# Patient Record
Sex: Male | Born: 1960 | Race: White | Hispanic: No | Smoking: Current every day smoker
Health system: Southern US, Community
[De-identification: ages and names within clinical notes are randomized; demographics above are authoritative.]

## PROBLEM LIST (undated history)

## (undated) DIAGNOSIS — F1490 Cocaine use, unspecified, uncomplicated: Secondary | ICD-10-CM

## (undated) DIAGNOSIS — I2699 Other pulmonary embolism without acute cor pulmonale: Secondary | ICD-10-CM

## (undated) DIAGNOSIS — B182 Chronic viral hepatitis C: Secondary | ICD-10-CM

## (undated) DIAGNOSIS — I10 Essential (primary) hypertension: Secondary | ICD-10-CM

## (undated) DIAGNOSIS — F1910 Other psychoactive substance abuse, uncomplicated: Secondary | ICD-10-CM

## (undated) DIAGNOSIS — F1020 Alcohol dependence, uncomplicated: Secondary | ICD-10-CM

## (undated) DIAGNOSIS — K7030 Alcoholic cirrhosis of liver without ascites: Secondary | ICD-10-CM

---

## 2007-10-22 ENCOUNTER — Emergency Department: Payer: Self-pay | Admitting: Emergency Medicine

## 2008-10-07 IMAGING — CR DG KNEE COMPLETE 4+V*L*
1 series · 5 of 5 positions shown · non-contrast
Comparison: none

REASON FOR EXAM: Injury
COMMENTS:   LMP: (Male)

PROCEDURE:     DXR - DXR KNEE LT COMP WITH OBLIQUES  - October 22, 2007  [DATE]
RESULT:     There does not appear to be evidence of acute fracture,
dislocation or malalignment. Findings suspicious for chronic injury along
the posterior lateral aspect of the knee are appreciated.

[Series 1: view not recorded · 0.17mm/px · 5 of 5 slices shown]
[im 1/5]
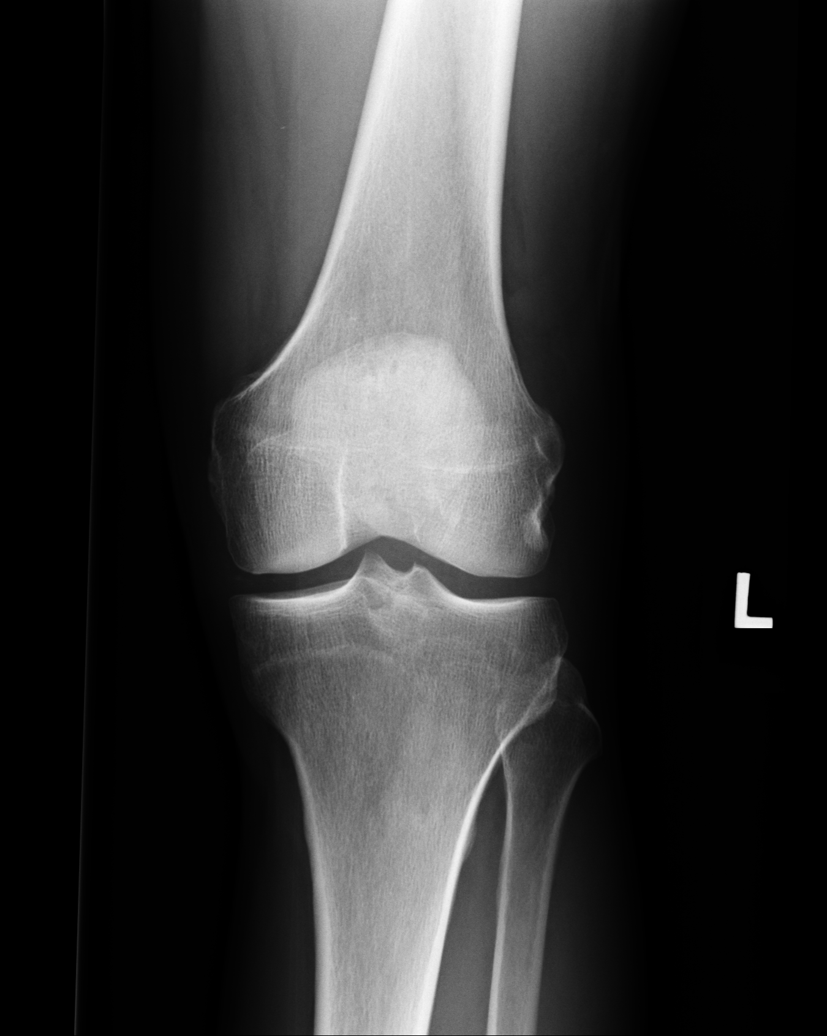
[im 2/5]
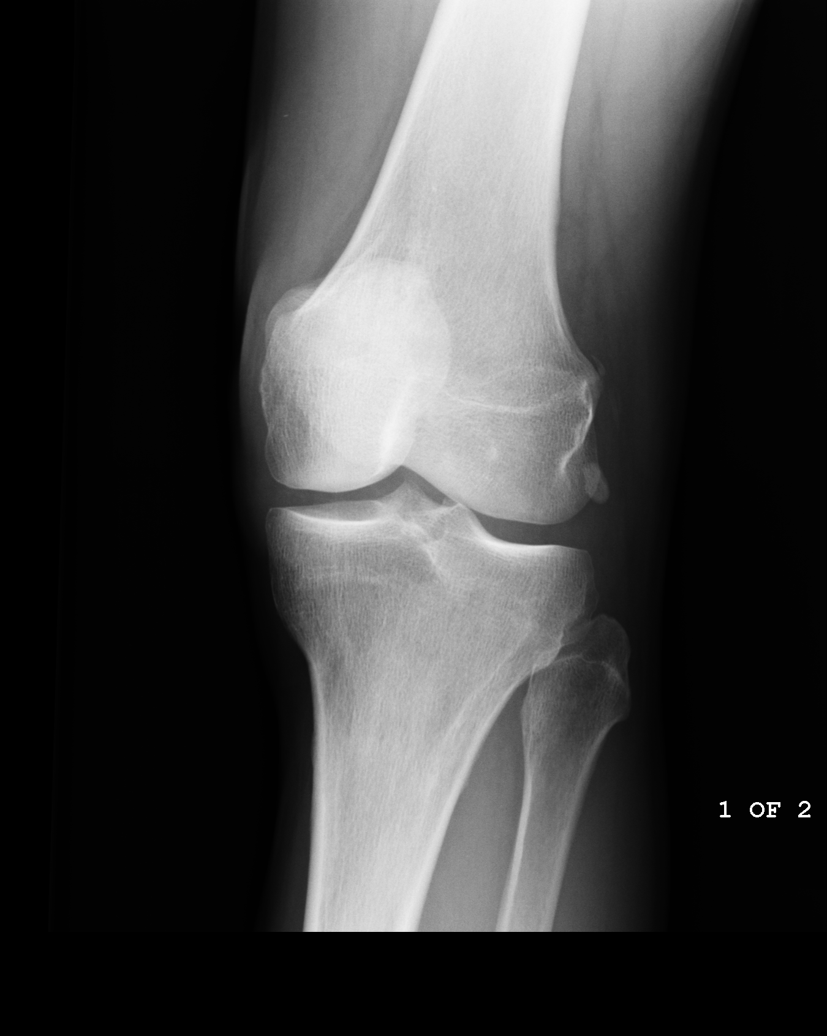
[im 3/5]
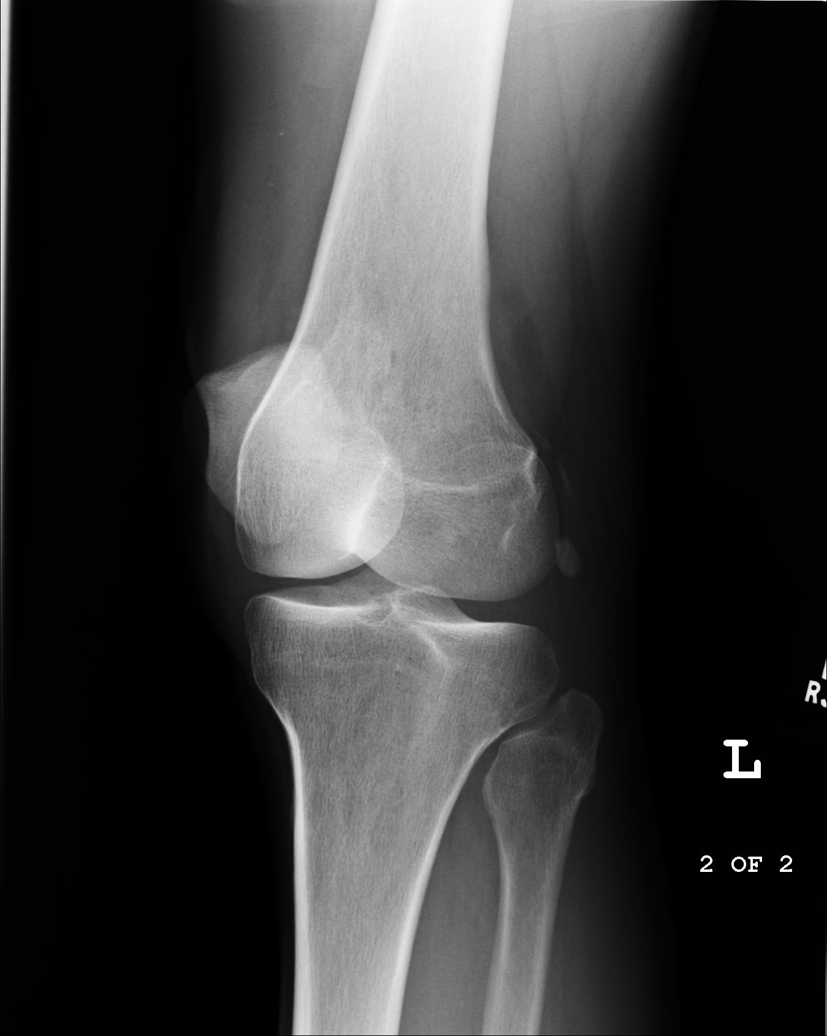
[im 4/5]
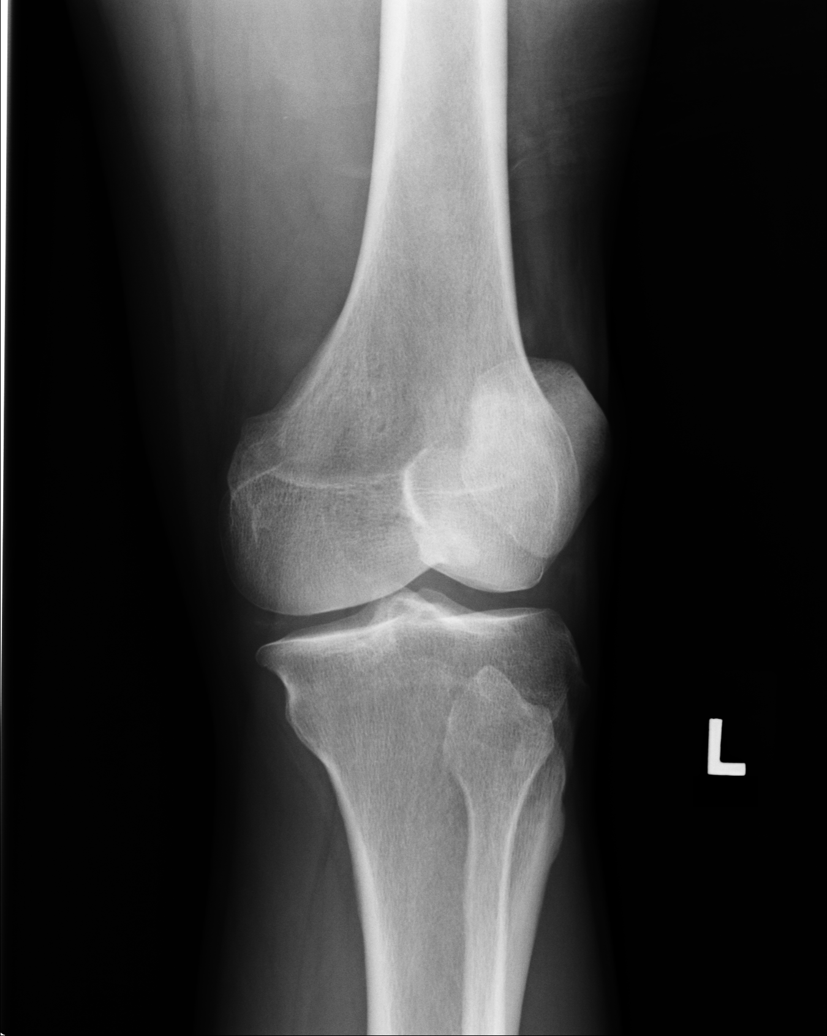
[im 5/5]
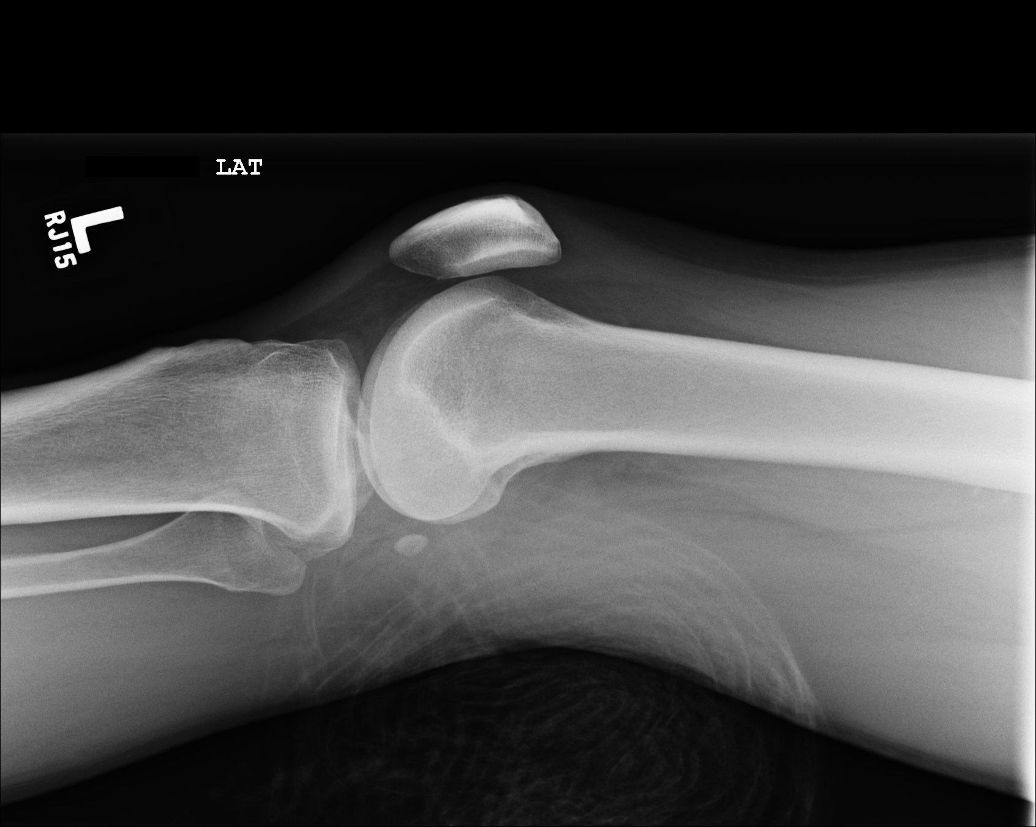

[5 of 5 positions shown; findings below may reference images not displayed]

IMPRESSION: 1.     No evidence of acute osseous abnormalities.
2.     If there is persistent clinical concern, further evaluation with MRI
is recommended.

## 2009-10-06 ENCOUNTER — Emergency Department: Payer: Self-pay | Admitting: Emergency Medicine

## 2011-09-22 ENCOUNTER — Inpatient Hospital Stay: Payer: Self-pay | Admitting: Psychiatry

## 2011-11-04 ENCOUNTER — Inpatient Hospital Stay: Payer: Self-pay | Admitting: Psychiatry

## 2011-11-04 LAB — CBC
HGB: 16.3 g/dL (ref 13.0–18.0)
MCHC: 33.7 g/dL (ref 32.0–36.0)
MCV: 94 fL (ref 80–100)
RBC: 5.11 10*6/uL (ref 4.40–5.90)
WBC: 6 10*3/uL (ref 3.8–10.6)

## 2011-11-04 LAB — URINALYSIS, COMPLETE
Bacteria: NONE SEEN
Bilirubin,UR: NEGATIVE
Glucose,UR: NEGATIVE mg/dL (ref 0–75)
Ketone: NEGATIVE
Nitrite: NEGATIVE
Protein: NEGATIVE
Specific Gravity: 1.009 (ref 1.003–1.030)
Squamous Epithelial: NONE SEEN
WBC UR: 1 /HPF (ref 0–5)

## 2011-11-04 LAB — DRUG SCREEN, URINE
Amphetamines, Ur Screen: NEGATIVE (ref ?–1000)
Benzodiazepine, Ur Scrn: NEGATIVE (ref ?–200)
Cocaine Metabolite,Ur ~~LOC~~: POSITIVE (ref ?–300)
MDMA (Ecstasy)Ur Screen: NEGATIVE (ref ?–500)
Opiate, Ur Screen: NEGATIVE (ref ?–300)
Phencyclidine (PCP) Ur S: NEGATIVE (ref ?–25)
Tricyclic, Ur Screen: NEGATIVE (ref ?–1000)

## 2011-11-04 LAB — COMPREHENSIVE METABOLIC PANEL
Albumin: 4.1 g/dL (ref 3.4–5.0)
Alkaline Phosphatase: 97 U/L (ref 50–136)
Calcium, Total: 9.6 mg/dL (ref 8.5–10.1)
Chloride: 101 mmol/L (ref 98–107)
Creatinine: 1.03 mg/dL (ref 0.60–1.30)
EGFR (Non-African Amer.): >60
Glucose: 172 mg/dL — ABNORMAL HIGH (ref 65–99)
SGOT(AST): 38 U/L — ABNORMAL HIGH (ref 15–37)
SGPT (ALT): 36 U/L
Total Protein: 8.5 g/dL — ABNORMAL HIGH (ref 6.4–8.2)

## 2011-11-04 LAB — TSH: Thyroid Stimulating Horm: 1.54 u[IU]/mL

## 2011-12-17 ENCOUNTER — Emergency Department: Payer: Self-pay | Admitting: Emergency Medicine

## 2011-12-17 LAB — URINALYSIS, COMPLETE
Bacteria: NONE SEEN
Bilirubin,UR: NEGATIVE
Protein: NEGATIVE
RBC,UR: NONE SEEN /HPF (ref 0–5)
Specific Gravity: 1.01 (ref 1.003–1.030)

## 2011-12-17 LAB — ACETAMINOPHEN LEVEL: Acetaminophen: <2 ug/mL

## 2011-12-17 LAB — ETHANOL
Ethanol %: 0.006 % (ref 0.000–0.080)
Ethanol: 6 mg/dL

## 2011-12-17 LAB — CBC
HCT: 47.1 % (ref 40.0–52.0)
HGB: 16.3 g/dL (ref 13.0–18.0)
MCH: 32.2 pg (ref 26.0–34.0)
MCHC: 34.6 g/dL (ref 32.0–36.0)
RBC: 5.07 10*6/uL (ref 4.40–5.90)
RDW: 13.4 % (ref 11.5–14.5)
WBC: 8.1 10*3/uL (ref 3.8–10.6)

## 2011-12-17 LAB — DRUG SCREEN, URINE
Amphetamines, Ur Screen: NEGATIVE (ref ?–1000)
Barbiturates, Ur Screen: NEGATIVE (ref ?–200)
Benzodiazepine, Ur Scrn: NEGATIVE (ref ?–200)
Cocaine Metabolite,Ur ~~LOC~~: POSITIVE (ref ?–300)
MDMA (Ecstasy)Ur Screen: NEGATIVE (ref ?–500)
Methadone, Ur Screen: NEGATIVE (ref ?–300)
Opiate, Ur Screen: NEGATIVE (ref ?–300)

## 2011-12-17 LAB — COMPREHENSIVE METABOLIC PANEL
Albumin: 4.6 g/dL (ref 3.4–5.0)
Anion Gap: 13 (ref 7–16)
BUN: 10 mg/dL (ref 7–18)
Calcium, Total: 9.4 mg/dL (ref 8.5–10.1)
Chloride: 100 mmol/L (ref 98–107)
Creatinine: 1.08 mg/dL (ref 0.60–1.30)
EGFR (African American): >60
Glucose: 103 mg/dL — ABNORMAL HIGH (ref 65–99)
SGOT(AST): 46 U/L — ABNORMAL HIGH (ref 15–37)
Total Protein: 8.9 g/dL — ABNORMAL HIGH (ref 6.4–8.2)

## 2011-12-17 LAB — LIPASE, BLOOD: Lipase: 160 U/L (ref 73–393)

## 2011-12-17 LAB — SALICYLATE LEVEL: Salicylates, Serum: 3.3 mg/dL — ABNORMAL HIGH

## 2011-12-24 ENCOUNTER — Emergency Department: Payer: Self-pay | Admitting: *Deleted

## 2011-12-24 LAB — ETHANOL: Ethanol %: 0.174 % — ABNORMAL HIGH (ref 0.000–0.080)

## 2011-12-24 LAB — DRUG SCREEN, URINE
Amphetamines, Ur Screen: NEGATIVE (ref ?–1000)
Barbiturates, Ur Screen: NEGATIVE (ref ?–200)
MDMA (Ecstasy)Ur Screen: NEGATIVE (ref ?–500)
Methadone, Ur Screen: NEGATIVE (ref ?–300)
Opiate, Ur Screen: NEGATIVE (ref ?–300)
Tricyclic, Ur Screen: NEGATIVE (ref ?–1000)

## 2012-10-20 IMAGING — CR RIGHT LITTLE FINGER 2+V
1 series · 3 of 3 positions shown · non-contrast
Comparison: none

REASON FOR EXAM: pain proximal phalanx
COMMENTS:   May transport without cardiac monitor

[Series 1: pa · 0.17mm/px · 3 of 3 slices shown]
[im 1/3]
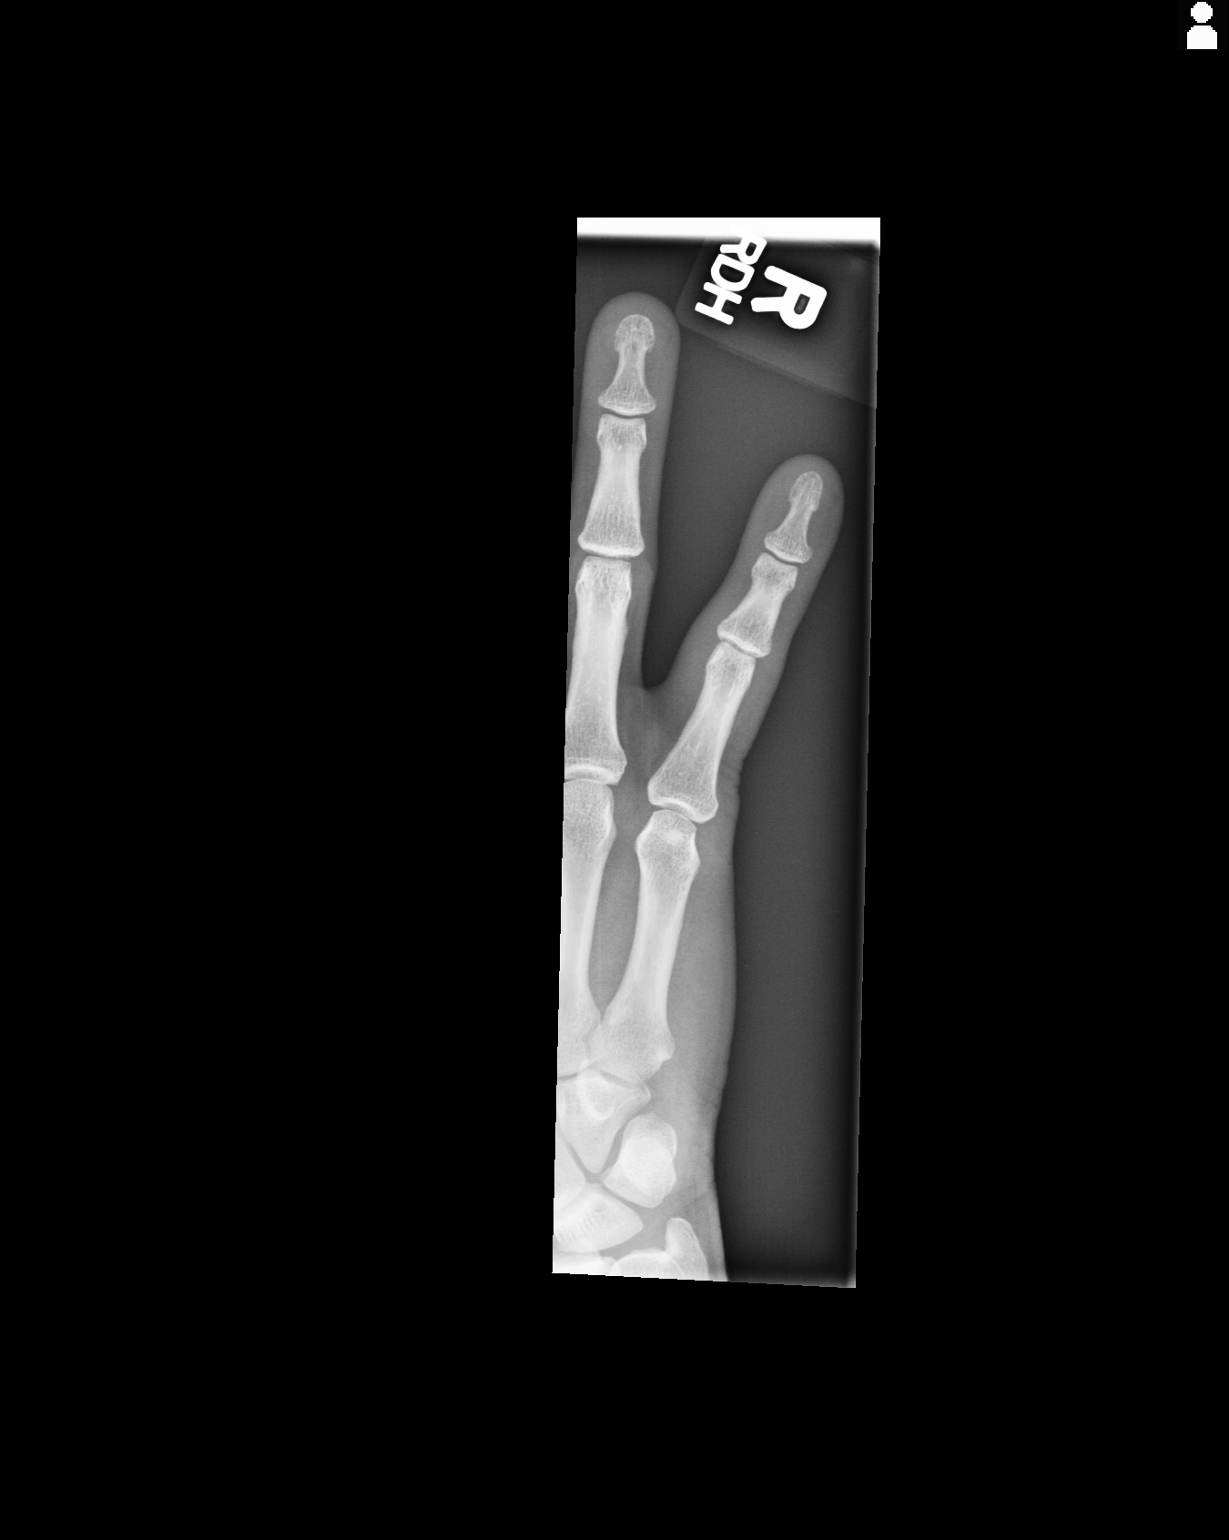
[im 2/3]
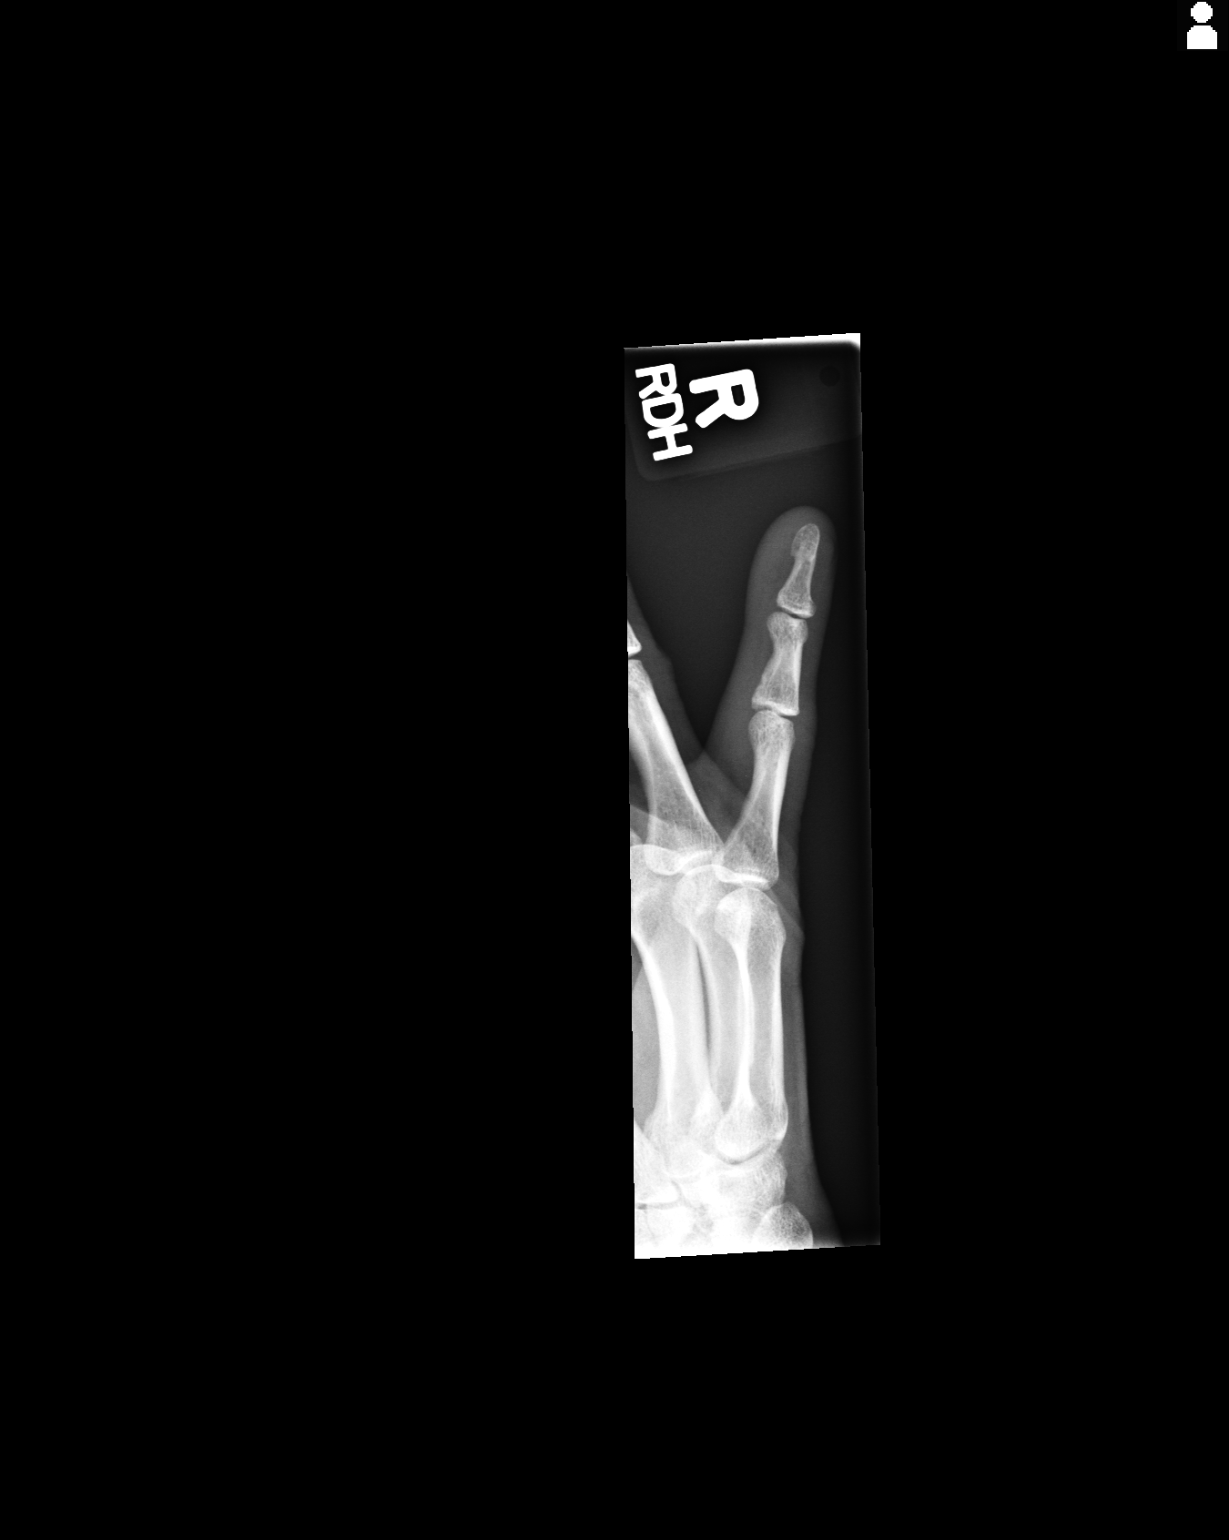
[im 3/3]
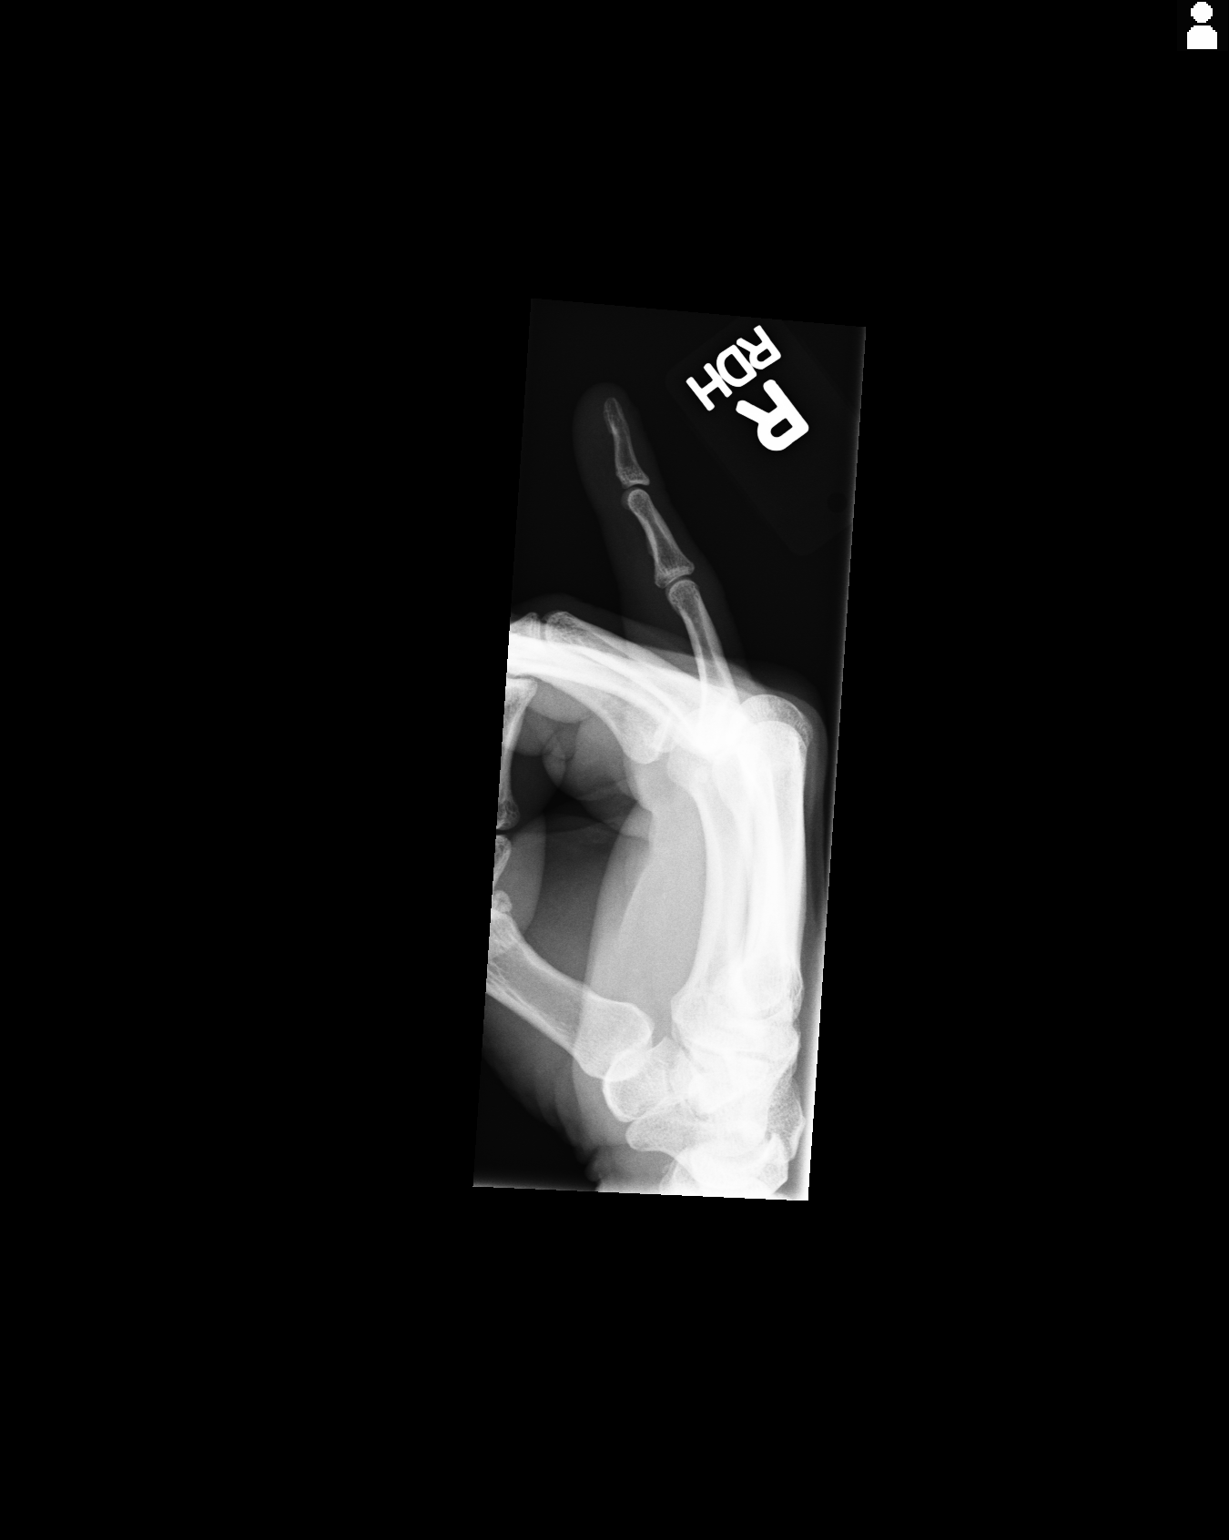

[3 of 3 positions shown; findings below may reference images not displayed]

PROCEDURE:     DXR - DXR FINGER PINKY 5TH DIGIT RT HA  - November 04, 2011 [DATE]

RESULT:     Three views of the right fifth finger reveal the bones to be
adequately mineralized. I do not see evidence of a fracture nor dislocation.
The interphalangeal joints are preserved. The metacarpophalangeal joint is
normal in appearance.
IMPRESSION: I see no acute bony abnormality of the right fifth finger.
There may be mild soft tissue swelling approximately.

## 2012-12-14 ENCOUNTER — Emergency Department: Payer: Self-pay | Admitting: Emergency Medicine

## 2012-12-14 LAB — URINALYSIS, COMPLETE
Bacteria: NONE SEEN
Bilirubin,UR: NEGATIVE
Blood: NEGATIVE
Leukocyte Esterase: NEGATIVE
Ph: 5 (ref 4.5–8.0)
Protein: NEGATIVE
Specific Gravity: 1.011 (ref 1.003–1.030)
Squamous Epithelial: NONE SEEN
WBC UR: <1 /HPF (ref 0–5)

## 2012-12-14 LAB — CBC
HCT: 45.7 % (ref 40.0–52.0)
HGB: 15.4 g/dL (ref 13.0–18.0)
MCH: 30.4 pg (ref 26.0–34.0)
MCHC: 33.6 g/dL (ref 32.0–36.0)
MCV: 90 fL (ref 80–100)
RBC: 5.06 10*6/uL (ref 4.40–5.90)
RDW: 15.1 % — ABNORMAL HIGH (ref 11.5–14.5)
WBC: 10.5 10*3/uL (ref 3.8–10.6)

## 2012-12-14 LAB — COMPREHENSIVE METABOLIC PANEL
Albumin: 4 g/dL (ref 3.4–5.0)
Alkaline Phosphatase: 129 U/L (ref 50–136)
Anion Gap: 12 (ref 7–16)
BUN: 12 mg/dL (ref 7–18)
Bilirubin,Total: 0.7 mg/dL (ref 0.2–1.0)
Calcium, Total: 8.8 mg/dL (ref 8.5–10.1)
Chloride: 102 mmol/L (ref 98–107)
Co2: 22 mmol/L (ref 21–32)
Creatinine: 0.8 mg/dL (ref 0.60–1.30)
EGFR (African American): >60
Glucose: 161 mg/dL — ABNORMAL HIGH (ref 65–99)
Osmolality: 275 (ref 275–301)
Potassium: 3.7 mmol/L (ref 3.5–5.1)
SGOT(AST): 74 U/L — ABNORMAL HIGH (ref 15–37)
SGPT (ALT): 60 U/L (ref 12–78)
Sodium: 136 mmol/L (ref 136–145)
Total Protein: 8 g/dL (ref 6.4–8.2)

## 2012-12-14 LAB — LIPASE, BLOOD: Lipase: 123 U/L (ref 73–393)

## 2012-12-14 LAB — TROPONIN I
Troponin-I: <0.02 ng/mL
Troponin-I: <0.02 ng/mL

## 2013-11-30 IMAGING — CR DG CHEST 1V PORT
1 series · 2 of 2 positions shown · non-contrast
Comparison: none

REASON FOR EXAM: Chest pain
COMMENTS:

PROCEDURE:     DXR - DXR PORTABLE CHEST SINGLE VIEW  - December 14, 2012  [DATE]
RESULT:     Lungs are clear. Heart size is normal.

[Series 1: ap · 0.17mm/px · 2 of 2 slices shown]
[im 1/2]
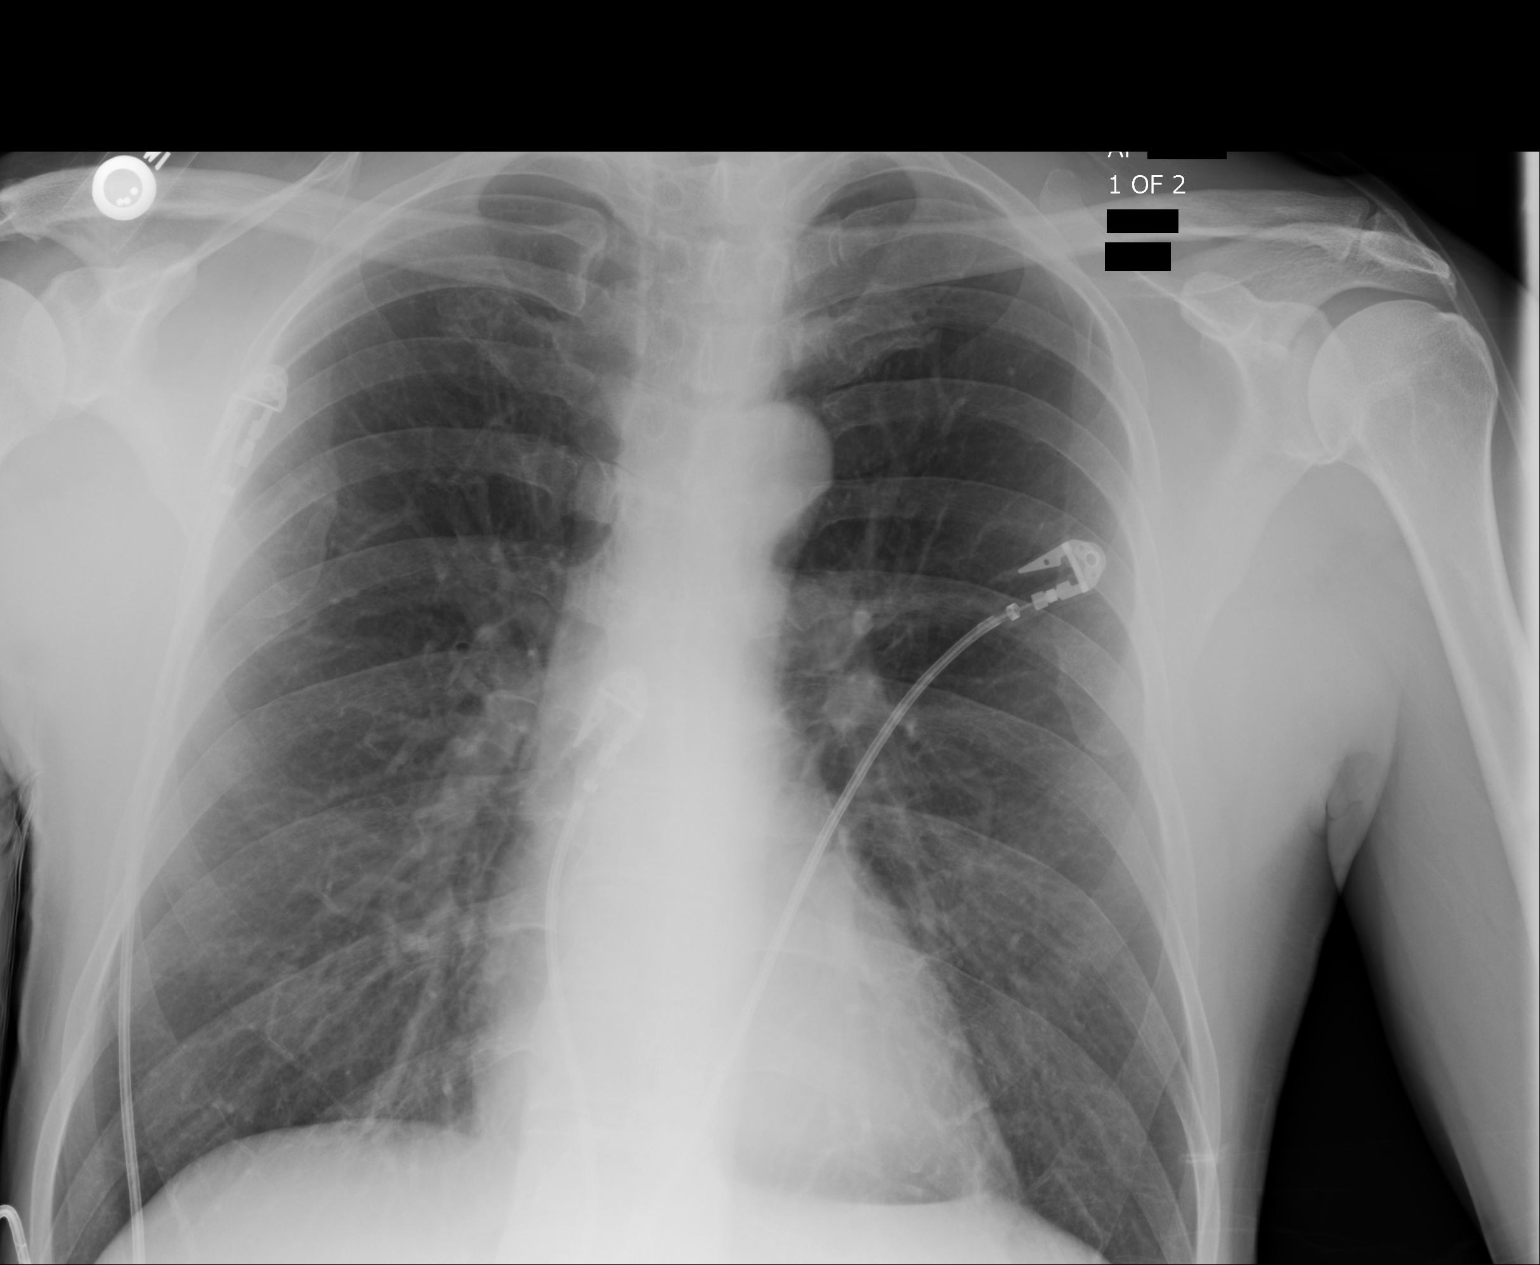
[im 2/2]
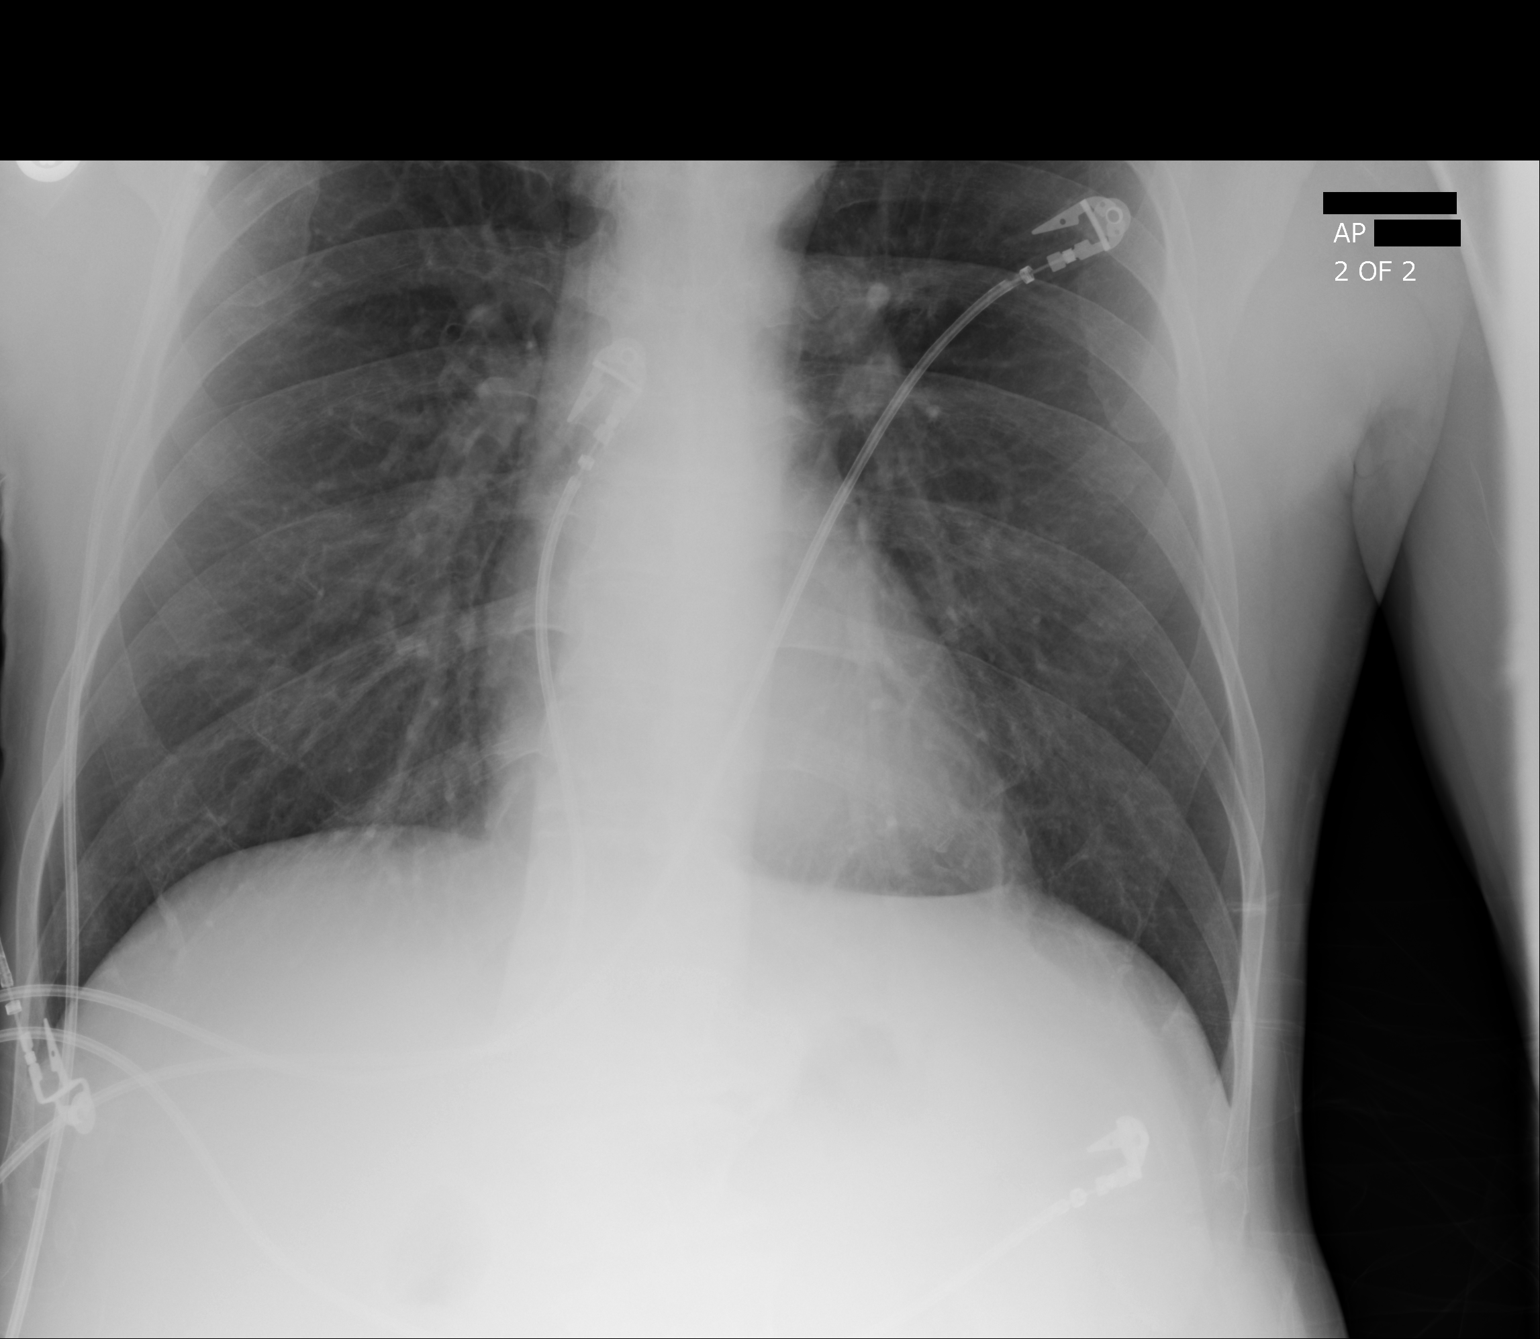

[2 of 2 positions shown; findings below may reference images not displayed]

IMPRESSION: No acute abnormality.
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
## 2014-03-07 ENCOUNTER — Emergency Department: Payer: Self-pay | Admitting: Emergency Medicine

## 2014-03-08 ENCOUNTER — Emergency Department: Payer: Self-pay | Admitting: Emergency Medicine

## 2014-03-08 LAB — COMPREHENSIVE METABOLIC PANEL
ALBUMIN: 3.6 g/dL (ref 3.4–5.0)
Alkaline Phosphatase: 93 U/L
Anion Gap: 12 (ref 7–16)
BUN: 6 mg/dL — ABNORMAL LOW (ref 7–18)
Bilirubin,Total: 1.3 mg/dL — ABNORMAL HIGH (ref 0.2–1.0)
CHLORIDE: 96 mmol/L — AB (ref 98–107)
CREATININE: 0.77 mg/dL (ref 0.60–1.30)
Calcium, Total: 9.1 mg/dL (ref 8.5–10.1)
Co2: 24 mmol/L (ref 21–32)
EGFR (Non-African Amer.): >60
Glucose: 107 mg/dL — ABNORMAL HIGH (ref 65–99)
OSMOLALITY: 263 (ref 275–301)
POTASSIUM: 3.2 mmol/L — AB (ref 3.5–5.1)
SGOT(AST): 71 U/L — ABNORMAL HIGH (ref 15–37)
SGPT (ALT): 68 U/L (ref 12–78)
SODIUM: 132 mmol/L — AB (ref 136–145)
Total Protein: 7.6 g/dL (ref 6.4–8.2)

## 2014-03-08 LAB — CBC
HCT: 42.5 % (ref 40.0–52.0)
HGB: 14.6 g/dL (ref 13.0–18.0)
MCH: 32.9 pg (ref 26.0–34.0)
MCHC: 34.3 g/dL (ref 32.0–36.0)
MCV: 96 fL (ref 80–100)
PLATELETS: 80 10*3/uL — AB (ref 150–440)
RBC: 4.43 10*6/uL (ref 4.40–5.90)
RDW: 14.8 % — AB (ref 11.5–14.5)
WBC: 7.8 10*3/uL (ref 3.8–10.6)

## 2014-03-08 LAB — TROPONIN I: Troponin-I: <0.02 ng/mL

## 2014-03-08 LAB — CK TOTAL AND CKMB (NOT AT ARMC): CK, TOTAL: 159 U/L

## 2014-09-30 ENCOUNTER — Emergency Department: Payer: Self-pay | Admitting: Emergency Medicine

## 2014-09-30 LAB — COMPREHENSIVE METABOLIC PANEL
ALBUMIN: 4.3 g/dL (ref 3.4–5.0)
ALK PHOS: 114 U/L
Anion Gap: 14 (ref 7–16)
BILIRUBIN TOTAL: 0.8 mg/dL (ref 0.2–1.0)
BUN: 10 mg/dL (ref 7–18)
CALCIUM: 8.4 mg/dL — AB (ref 8.5–10.1)
CO2: 20 mmol/L — AB (ref 21–32)
CREATININE: 0.95 mg/dL (ref 0.60–1.30)
Chloride: 96 mmol/L — ABNORMAL LOW (ref 98–107)
EGFR (African American): >60
EGFR (Non-African Amer.): >60
GLUCOSE: 212 mg/dL — AB (ref 65–99)
OSMOLALITY: 266 (ref 275–301)
Potassium: 3.9 mmol/L (ref 3.5–5.1)
SGOT(AST): 251 U/L — ABNORMAL HIGH (ref 15–37)
SGPT (ALT): 147 U/L — ABNORMAL HIGH
SODIUM: 130 mmol/L — AB (ref 136–145)
Total Protein: 8 g/dL (ref 6.4–8.2)

## 2014-09-30 LAB — LIPASE, BLOOD: Lipase: 184 U/L (ref 73–393)

## 2014-09-30 LAB — CBC
HCT: 46 % (ref 40.0–52.0)
HGB: 15 g/dL (ref 13.0–18.0)
MCH: 32.6 pg (ref 26.0–34.0)
MCHC: 32.7 g/dL (ref 32.0–36.0)
MCV: 100 fL (ref 80–100)
Platelet: 113 10*3/uL — ABNORMAL LOW (ref 150–440)
RBC: 4.62 10*6/uL (ref 4.40–5.90)
RDW: 13.3 % (ref 11.5–14.5)
WBC: 6.4 10*3/uL (ref 3.8–10.6)

## 2014-09-30 LAB — TROPONIN I: Troponin-I: <0.02 ng/mL

## 2014-09-30 LAB — ETHANOL: Ethanol: 18 mg/dL
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
## 2015-01-16 NOTE — H&P (Signed)
PATIENT NAME:  Jose [name], Jose [name] MR#:  [id number] DATE OF BIRTH:  1961-06-08  DATE OF ADMISSION:  09/22/2011  IDENTIFYING INFORMATION: The patient is a 54 year old white male not employed and last worked in 1996. The patient is divorced for eight years and has been living by himself in a trailer. The patient comes for admission to [facility] with chief complaint "I want to get rid of my alcohol habit. I want to get over my drugs".  HISTORY OF PRESENT ILLNESS: The patient reports that he has been having problems with alcohol and drugs for several years. In fact, he started having problems with alcohol drinking 20 years ago and for the past several years he has been drinking at the rate of a 12 pack per day and the last drink was just prior to his admission to the hospital. In addition, he smokes THC occasionally and smokes cocaine about 100 dollars worth whenever he has money. Last smoked it two days ago. He decided that he needed help.   PAST PSYCHIATRIC HISTORY: History of inpatient hospitalization on Psychiatry [facility] years ago at [facility] for a similar problem for detox and he was sent to [facility]. No history of suicide attempts. He is not being followed by any psychiatrist at this time.   FAMILY HISTORY OF MENTAL ILLNESS: None known for mental illness. No known history of suicides in the family.   FAMILY HISTORY: Raised by parents. Father worked in a mill. Father died of cancer of the liver. Mother worked in a mill. Mother is living and 53 years old. He has three brothers and one sister. Close to family.   PERSONAL HISTORY: Born in [place]. Dropped out in 10th grade to go to work. No GED.  WORK HISTORY: First job was at [employer] at age 54 years. This job lasted for 7 years. Quit when he had a DWI and had transportation problems. Longest job that he has held was for an [employer], [employer] and this job lasted for several years and quit after he had a DWI and had transportation problems. Last  worked at few months ago.[employer].   MILITARY HISTORY: None.  MARRIAGES: Married once. Divorced and cause of divorce "we could not get along". No children.  ALCOHOL AND DRUGS: First drink of alcohol was age 54 years. Became a problem several years ago and has become worse for the past five years and he's drinking at the rate of 12 pack per day and last drink was just prior to admission to the hospital. Had three DWI's. Lost his driver's license. He gets around by getting rides from friends and others. Does admit smoking THC occasionally. Does admit smoking cocaine whenever he has money and smokes about 100 dollars worth and last smoked it two days ago.   MEDICAL HISTORY: No known high blood pressure. No known diabetes mellitus. No major surgeries. No major injuries. No history of motor vehicle accident. Never been unconscious.   ALLERGIES: No known drug allergies.  PRIMARY CARE PHYSICIAN: He is not being followed by any physician. Does not have a physician that takes care of his physical examinations.  PHYSICAL EXAMINATION:  VITAL SIGNS: Temperature 96.2, pulse 98 per minute and regular, respirations 18 per minute and regular, blood pressure 140/84 mmHg.  HEENT: Head is normocephalic and atraumatic. Pupils equal, round, and reactive to light and accommodation. Fundi bilaterally benign. Tympanic membranes visualized and clear.   NECK: Supple without any organomegaly, lymphadenopathy, or thyromegaly.  CHEST: Normal expansion. Normal breath  sounds heard.  HEART: Normal S1, S2 without any murmurs or gallops.  ABDOMEN: Soft. No organomegaly. Bowel sounds heard.   RECTAL: Deferred.  SKIN: Normal turgor. No rash. Warm and dry. Intact.  EXTREMITIES: Nontender. Normal range of motion. No signs of injury. No pedal edema.  NEUROLOGIC: Gait is normal. Romberg is negative. Cranial nerves II through XII grossly intact. Deep tendon reflexes 2+. Plantars normal response.  MENTAL STATUS EXAMINATION: The  patient is dressed in street clothes. Alert and oriented. He knew it was December 2012 and he was at [facility]. He knew the capital of [place]. He knew the capital of the [place]. He could name the current president. He does admit feeling low and down and depressed about his situation and life in general and wanting to get over his alcohol drinking problem along with cocaine problems. Does admit feeling hopeless and helpless. Admits feeling worthless and useless. Denies any ideas to hurt himself or others. No evidence of psychosis. Denies auditory or visual hallucinations. Denies any delusional or paranoid thinking. He could spell the word world forward but could not spell it backward. He could count money and knew four quarters, ten dimes, 20 nickels, and 100 pennies are in a dollar with a little prompting and help. Judgment is intact. For a fire, he said he would leave. For an envelope, he said he would mail it. Does admit to sleep and appetite disturbance. Says it gets worse when he's drinking alcohol. Insight and judgment guarded.  IMPRESSION: AXIS I: 1. Alcohol dependence, chronic, continuous. 2. Cocaine abuse. 3. THC abuse. 4. Nicotine dependence. 5. Substance induced mood disorder.  AXIS II: Deferred.   AXIS III: None major.  AXIS IV: Severe. Long history of alcohol drinking and has had three DWI's, occupational and financial secondary to the same.  AXIS V: GAF 25.  PLAN: The patient is admitted to [facility] for close observation, evaluation, and help. He will be started on CIWA protocol along with antidepressant and medication to help him rest. During the stay in the hospital, he will be given milieu therapy and supportive counseling. He will take part in individual and group therapy when he is ready to do so. Substance abuse counseling will be addressed during the stay in the hospital. At the time of discharge, he will be completely detoxed. He  will not have any withdrawal problems, his mood will be stable, and he will be given follow-up appointments in the community. He will be referred to substance abuse program if he wishes to go for them.   ____________________________ Jannet Mantis. Guss Bunde, MD skc:drc D: 09/23/2011 13:21:53 ET T: 09/23/2011 13:50:33 ET JOB#: [id number]  cc: Monika Salk K. Guss Bunde, MD, <Dictator> Beau Fanny MD ELECTRONICALLY SIGNED 09/29/2011 17:12

## 2015-01-16 NOTE — H&P (Signed)
PATIENT NAME:  Jose [name], Jose [name] MR#:  [id number] DATE OF BIRTH:  01/11/1961  DATE OF ADMISSION:  11/04/2011 DATE OF ASSESSMENT: 11/05/2011  REFERRING PHYSICIAN: Daryel November, Jose [name]   ATTENDING PHYSICIAN: Jose Ventress B. Jennet Maduro, Jose [name]   IDENTIFYING DATA: Jose [name] is a 54 year old male with a history of substance use.   CHIEF COMPLAINT: "I need detox."   HISTORY OF PRESENT ILLNESS: Jose [name] was discharged from [facility] at the beginning of January. He maintained sobriety for only four days. He started drinking alcohol at the rate of 8 to 10 beers a day, abusing Percocet, Xanax, Klonopin and cocaine. 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 He believes that if he straightens up the owners will hire him again. He was discharged on a combination of Celexa and trazodone in January but did not continue medication at home. He does not endorse any symptoms of depression, anxiety, no psychosis, no symptoms suggestive of bipolar mania. He believes that he comes to the hospital for a short hospitalization and will be fine.   PAST PSYCHIATRIC HISTORY: He had one detox 20 years ago, another at the beginning of January. There is no history of suicide attempt or hospitalization for other problems than substance abuse.   FAMILY PSYCHIATRIC HISTORY: No known mental illness or suicides in the family.   SOCIAL HISTORY: He was born in [place]. He quit school in the tenth grade. No GED. He started working right away. He quit the job after he got a DUI and had transportation problems. 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 He is homeless at the moment but believes that his mother will let him move in if he get a job and completes detox.   PAST MEDICAL HISTORY: None reported.   MEDICATIONS ON ADMISSION: None.   ALLERGIES: No known drug  allergies.   REVIEW OF SYSTEMS: CONSTITUTIONAL: No fevers or chills. EYES: No double or blurred vision. ENT: No hearing loss. RESPIRATORY:  No shortness of breath or cough. CARDIOVASCULAR: No chest pain or orthopnea. GASTROINTESTINAL: No abdominal pain, nausea, diarrhea, or vomiting. GU: No incontinence or frequency. ENDOCRINE: No heat or cold intolerance. LYMPHATIC: No anemia or easy bruising. INTEGUMENTARY: No acne or rash. MUSCULOSKELETAL: No muscle or joint pain. NEUROLOGIC: No tingling or weakness. PSYCHIATRIC: See history of present illness for details.   PHYSICAL EXAMINATION:  VITAL SIGNS: Blood pressure 137/94, pulse 108, respirations 20, temperature 96.2.   GENERAL: This is a well-developed male in no acute distress.   HEENT: The pupils are equal, round, and reactive to light. Sclerae are anicteric.   NECK: Supple. No thyromegaly.   LUNGS: Clear to auscultation. No dullness to percussion.   HEART: Regular rhythm and rate. No murmurs, rubs, or gallops.   ABDOMEN: Soft, nontender, nondistended. Positive bowel sounds.   MUSCULOSKELETAL: Normal muscle strength in all extremities.   SKIN: No rashes or bruises   LYMPHATIC: No cervical adenopathy.   NEUROLOGIC: Cranial nerves II through XII are intact.   LABORATORY, DIAGNOSTIC AND RADIOLOGICAL DATA:  Chemistries are within normal limits except for blood glucose of 172.  Blood alcohol level is zero.  LFTs are within normal limits except for AST of 38. TSH 1.54.  Urine toxicology screen positive for cocaine. CBC within normal limits.  Urinalysis is not suggestive of urinary  tract infection.   MENTAL STATUS EXAMINATION ON ADMISSION: The patient is alert and oriented to person, place, time, and situation. He is marginally groomed. There is some psychomotor retardation. He is in bed looking tired. He maintains good eye contact. His speech is soft. Mood is depressed with flat affect. Thought processing is logical and goal oriented.  Thought content: He denies suicidal or homicidal ideation. There are no delusions or paranoia. There are no auditory or visual hallucinations. His cognition is grossly intact. His insight and judgment are questionable.   SUICIDE RISK ASSESSMENT ON ADMISSION: This is a patient with a lifelong history of alcoholism and substance abuse who is unable to stop, who most likely came to the hospital to please his mother and his boss. He does not seem to be committed to sobriety and expects only a brief hospital stay.   ASSESSMENT:  AXIS I:  1. Alcohol dependence.  2. Polysubstance dependence.   AXIS II: Deferred.   AXIS III: None.   AXIS IV: Mental illness, substance abuse, employment, financial, housing, primary support, access to care, poor insight.   AXIS V: Global Assessment of Functioning score is 25.   PLAN: The patient was admitted to [facility] unit for safety, stabilization, and medication management. He was initially placed on suicide precautions and was closely monitored for any unsafe behaviors. He underwent full psychiatric and risk assessment. He received pharmacotherapy, individual and group psychotherapy, substance abuse counseling, and support from therapeutic milieu.   1. Detox: The patient was placed on a standard CIWA protocol. He will be monitored for any signs of alcohol withdrawal.  2. Substance abuse treatment: The patient declines.  3. Disposition: Most likely to his mother's place if she agrees to take him.   ____________________________ Jose GoodieJolanta B. Jennet MaduroPucilowska, Jose [name] jbp:cbb D: 11/05/2011 17:56:18 ET T: 11/05/2011 19:23:34 ET JOB#: [id number]  cc: Le Faulcon B. Jennet MaduroPucilowska, Jose [name], <Dictator> Shari ProwsJOLANTA B Kellee Sittner Jose [name] ELECTRONICALLY SIGNED 11/09/2011 15:27

## 2015-02-21 IMAGING — CR RIGHT ANKLE - COMPLETE 3+ VIEW
1 series · 3 of 3 positions shown · non-contrast
Comparison: None.

CLINICAL DATA: Lateral malleolus pain post twisting injury 2 days
ago

EXAM:
RIGHT ANKLE - COMPLETE 3+ VIEW

[Series 1: ap · 0.17mm/px · 3 of 3 slices shown]
[im 1/3]
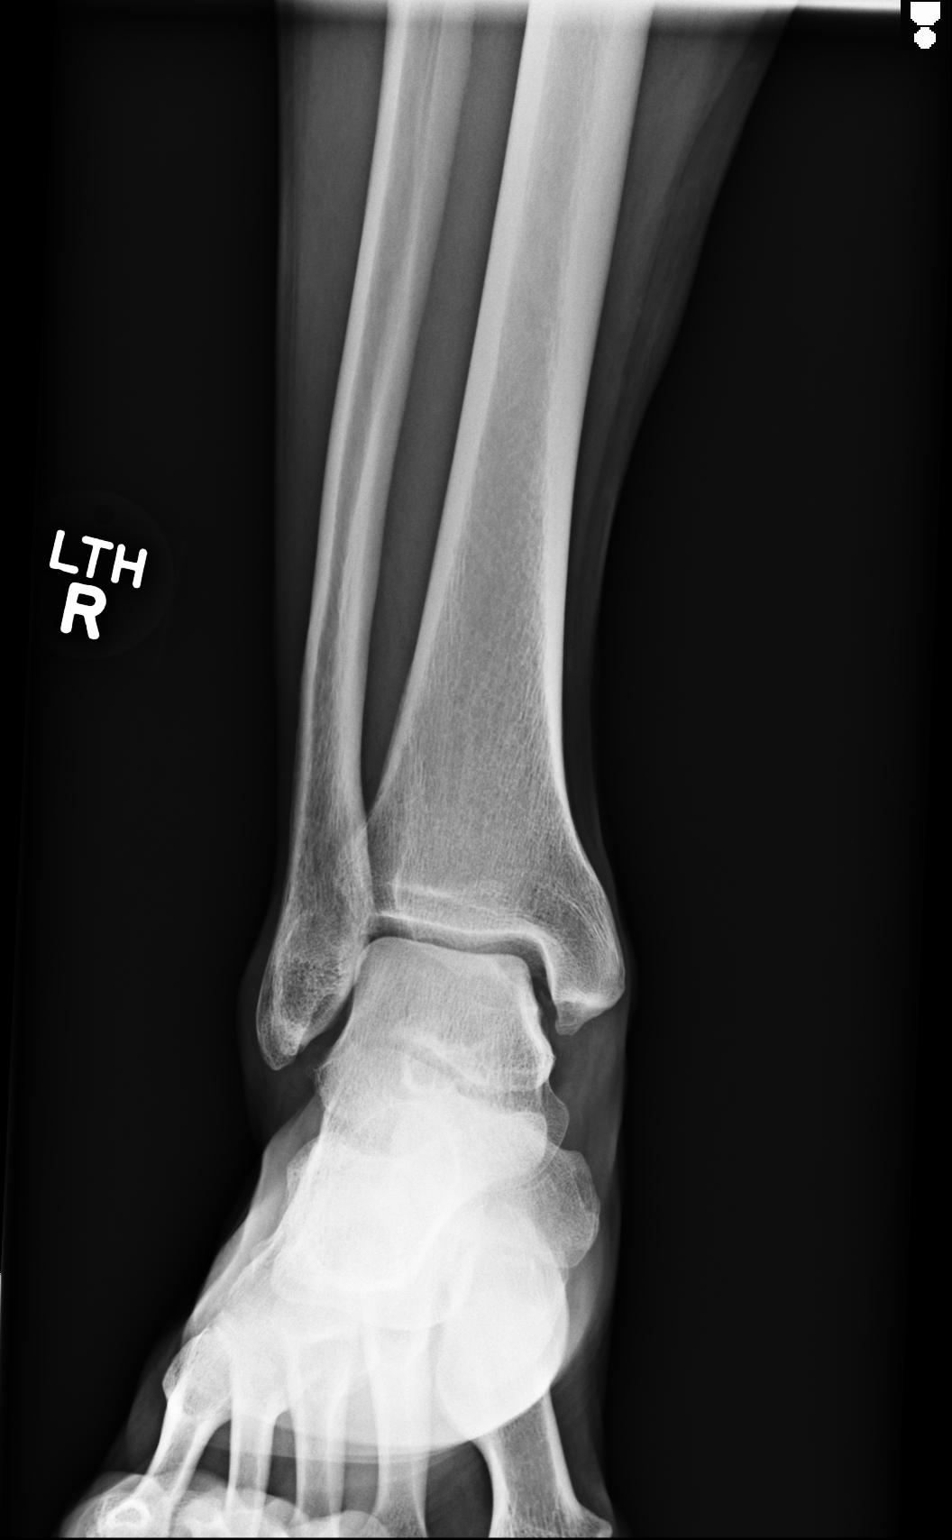
[im 2/3]
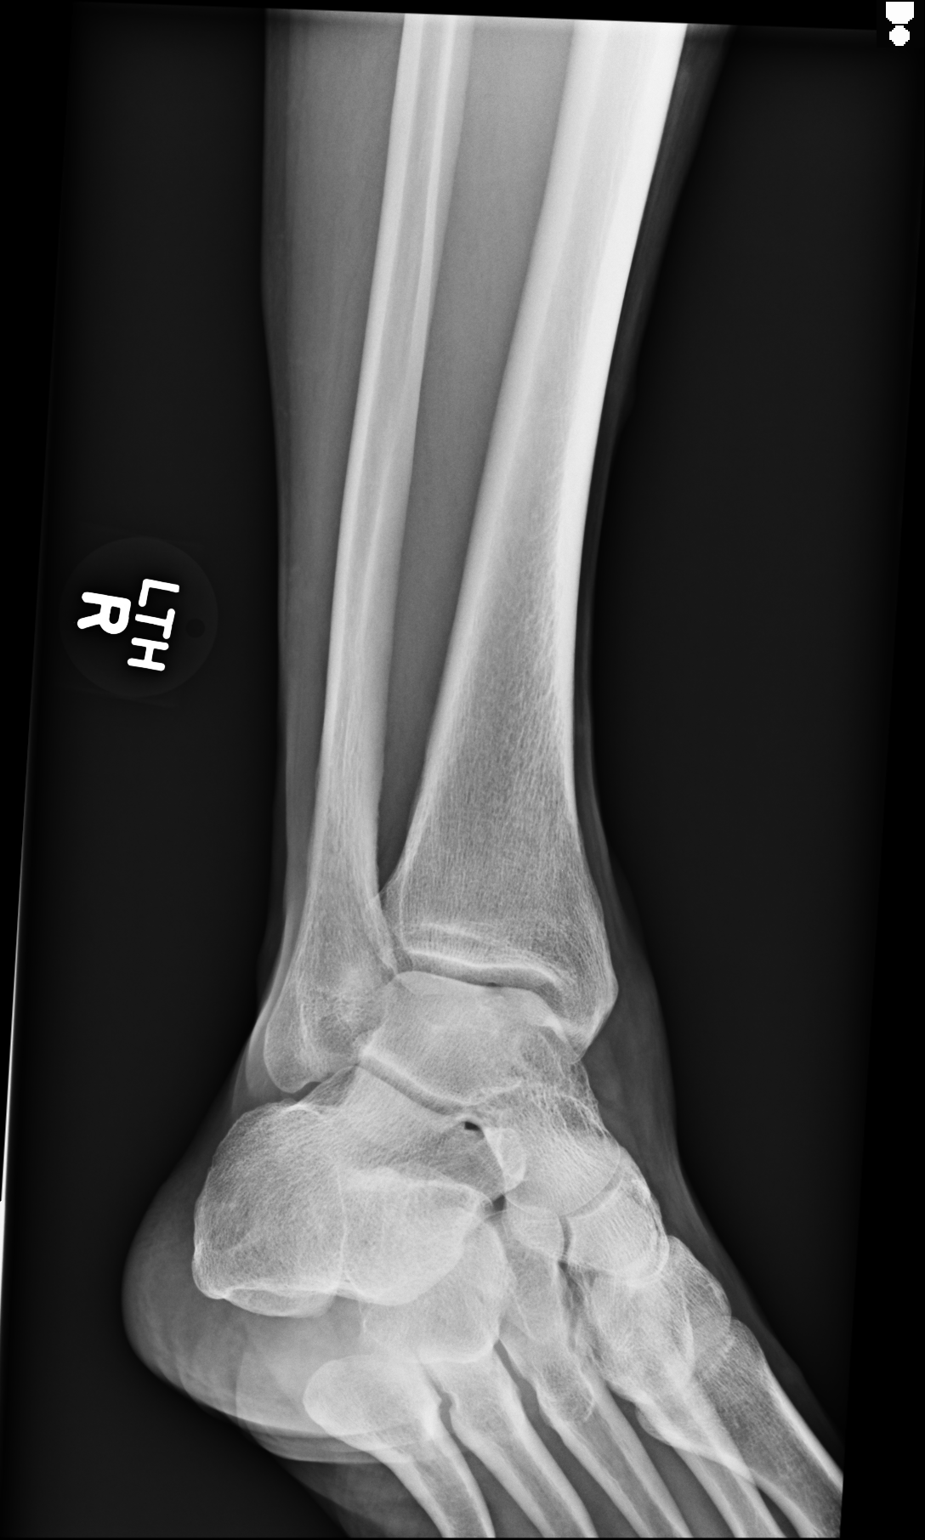
[im 3/3]
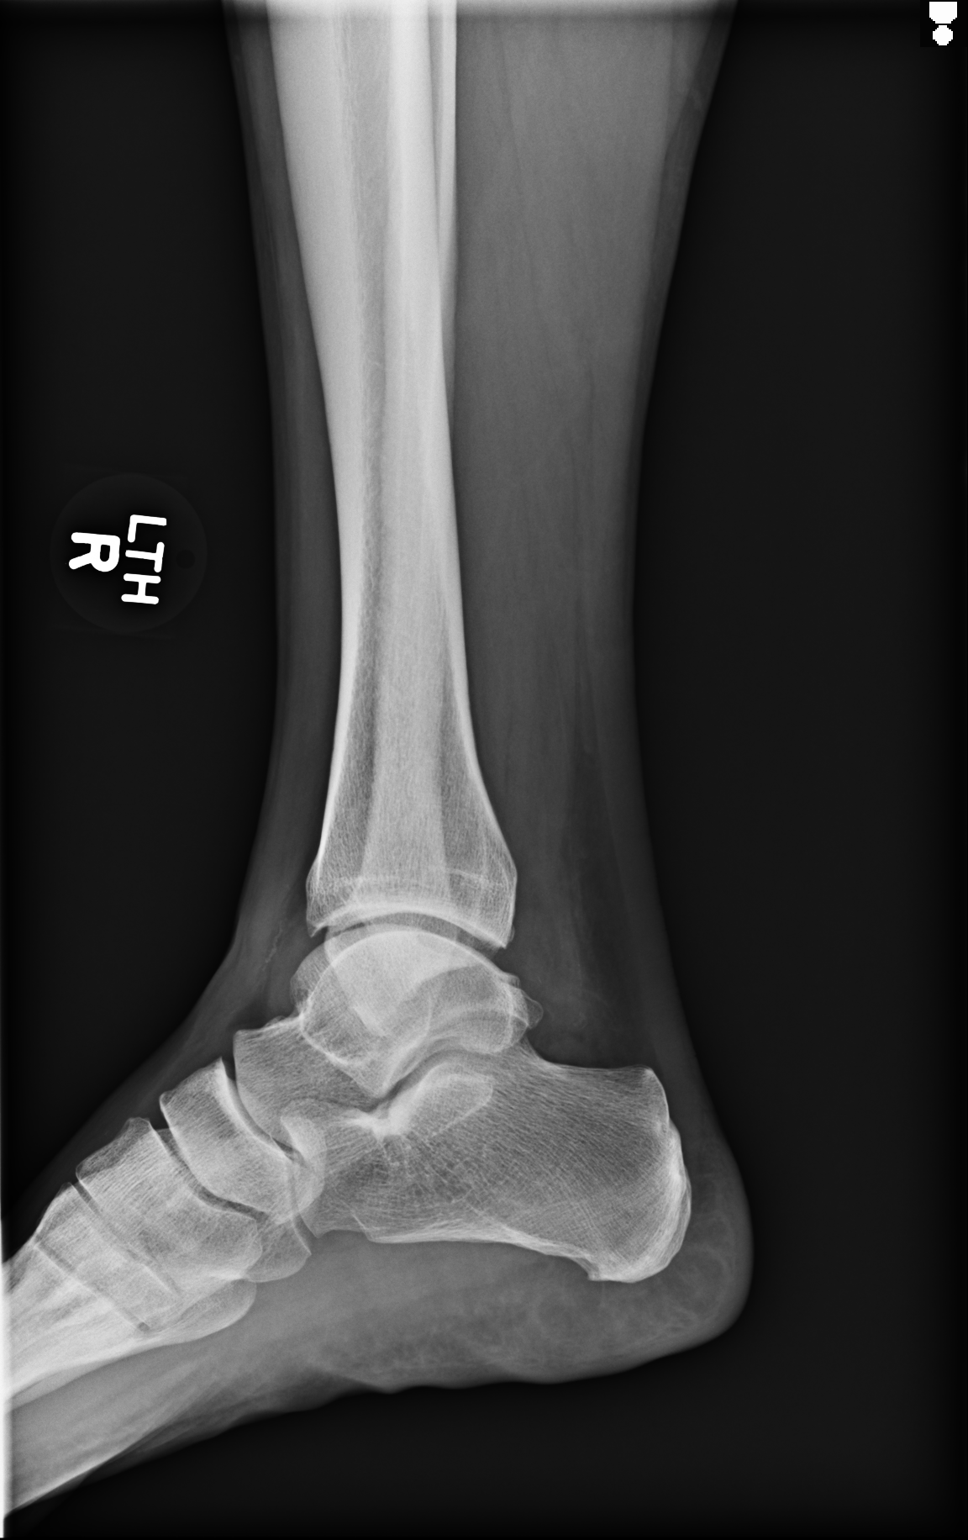

[3 of 3 positions shown; findings below may reference images not displayed]

FINDINGS: Three views of right ankle submitted. No acute fracture or
subluxation. Ankle mortise is preserved.
IMPRESSION: Negative.

## 2015-02-22 IMAGING — CR DG CHEST 1V PORT
1 series · 2 of 2 positions shown · non-contrast
Comparison: Portable chest x-ray December 14, 2012.

CLINICAL DATA: One day history of chest pain

EXAM:
PORTABLE CHEST - 1 VIEW

[Series 1: ap · 0.17mm/px · 2 of 2 slices shown]
[im 1/2]
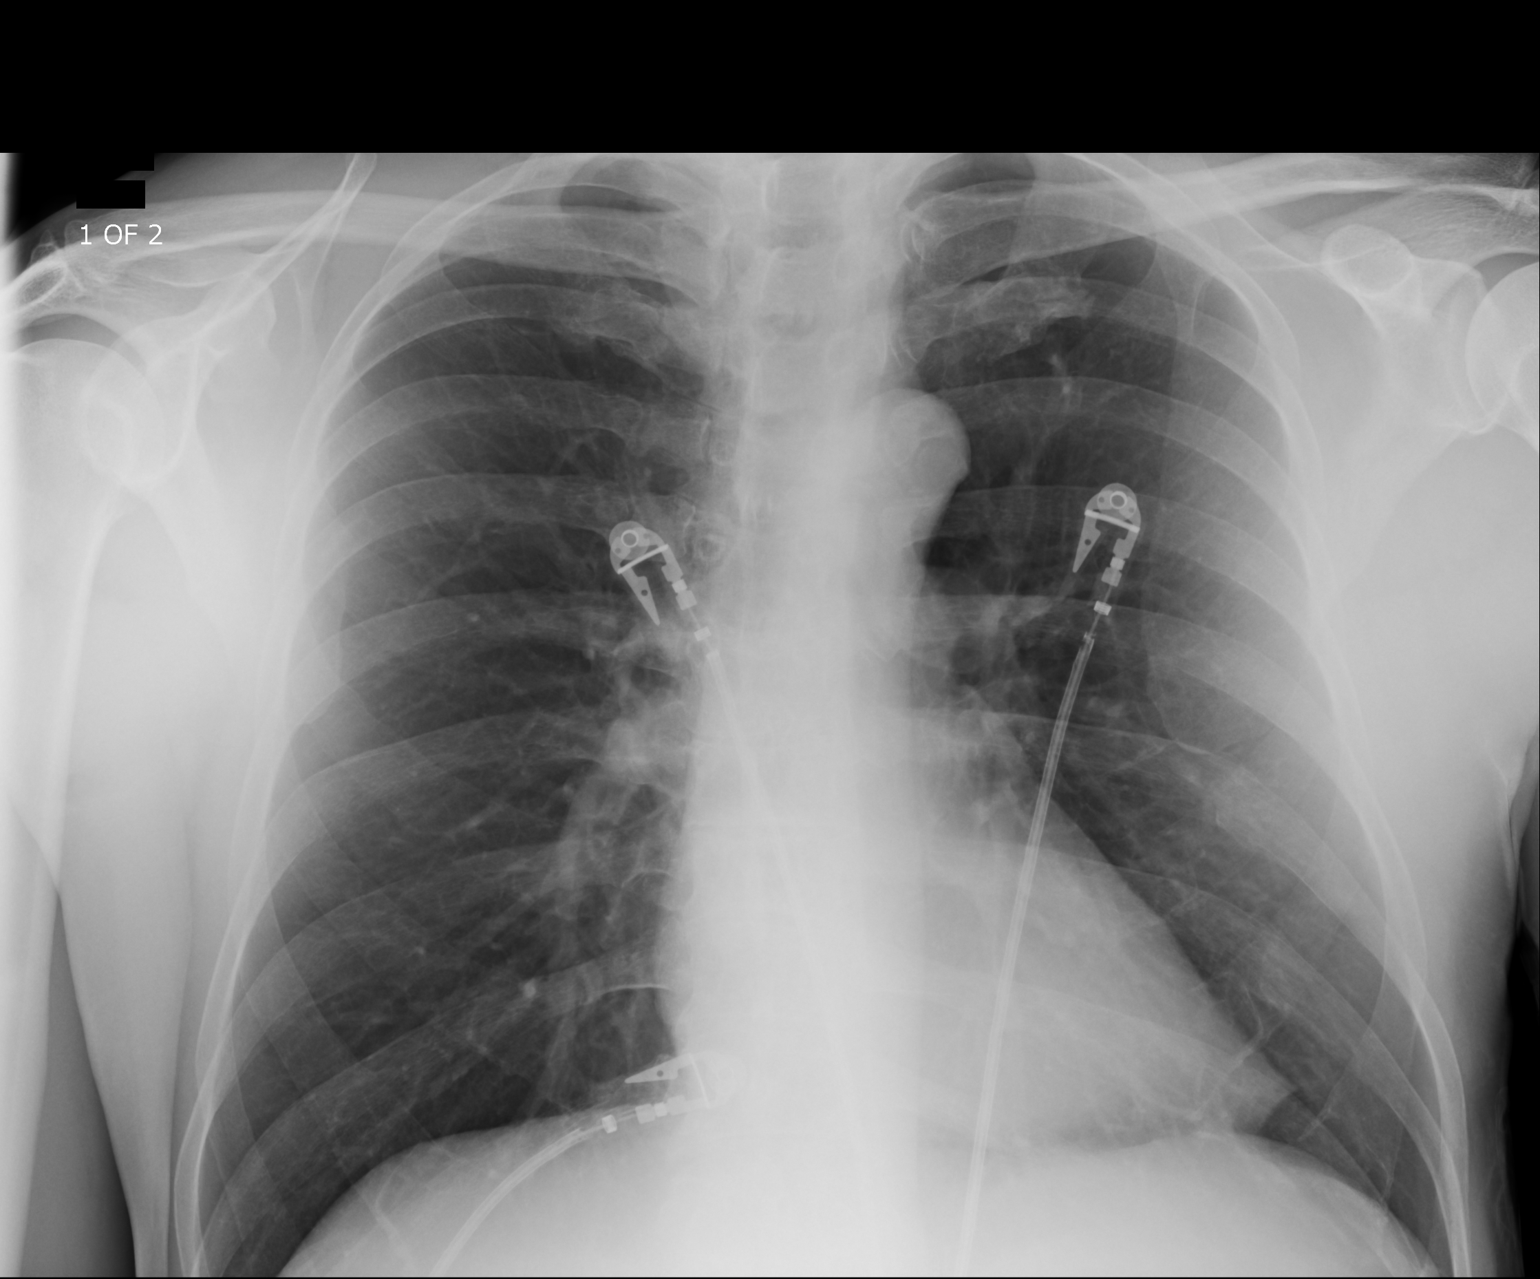
[im 2/2]
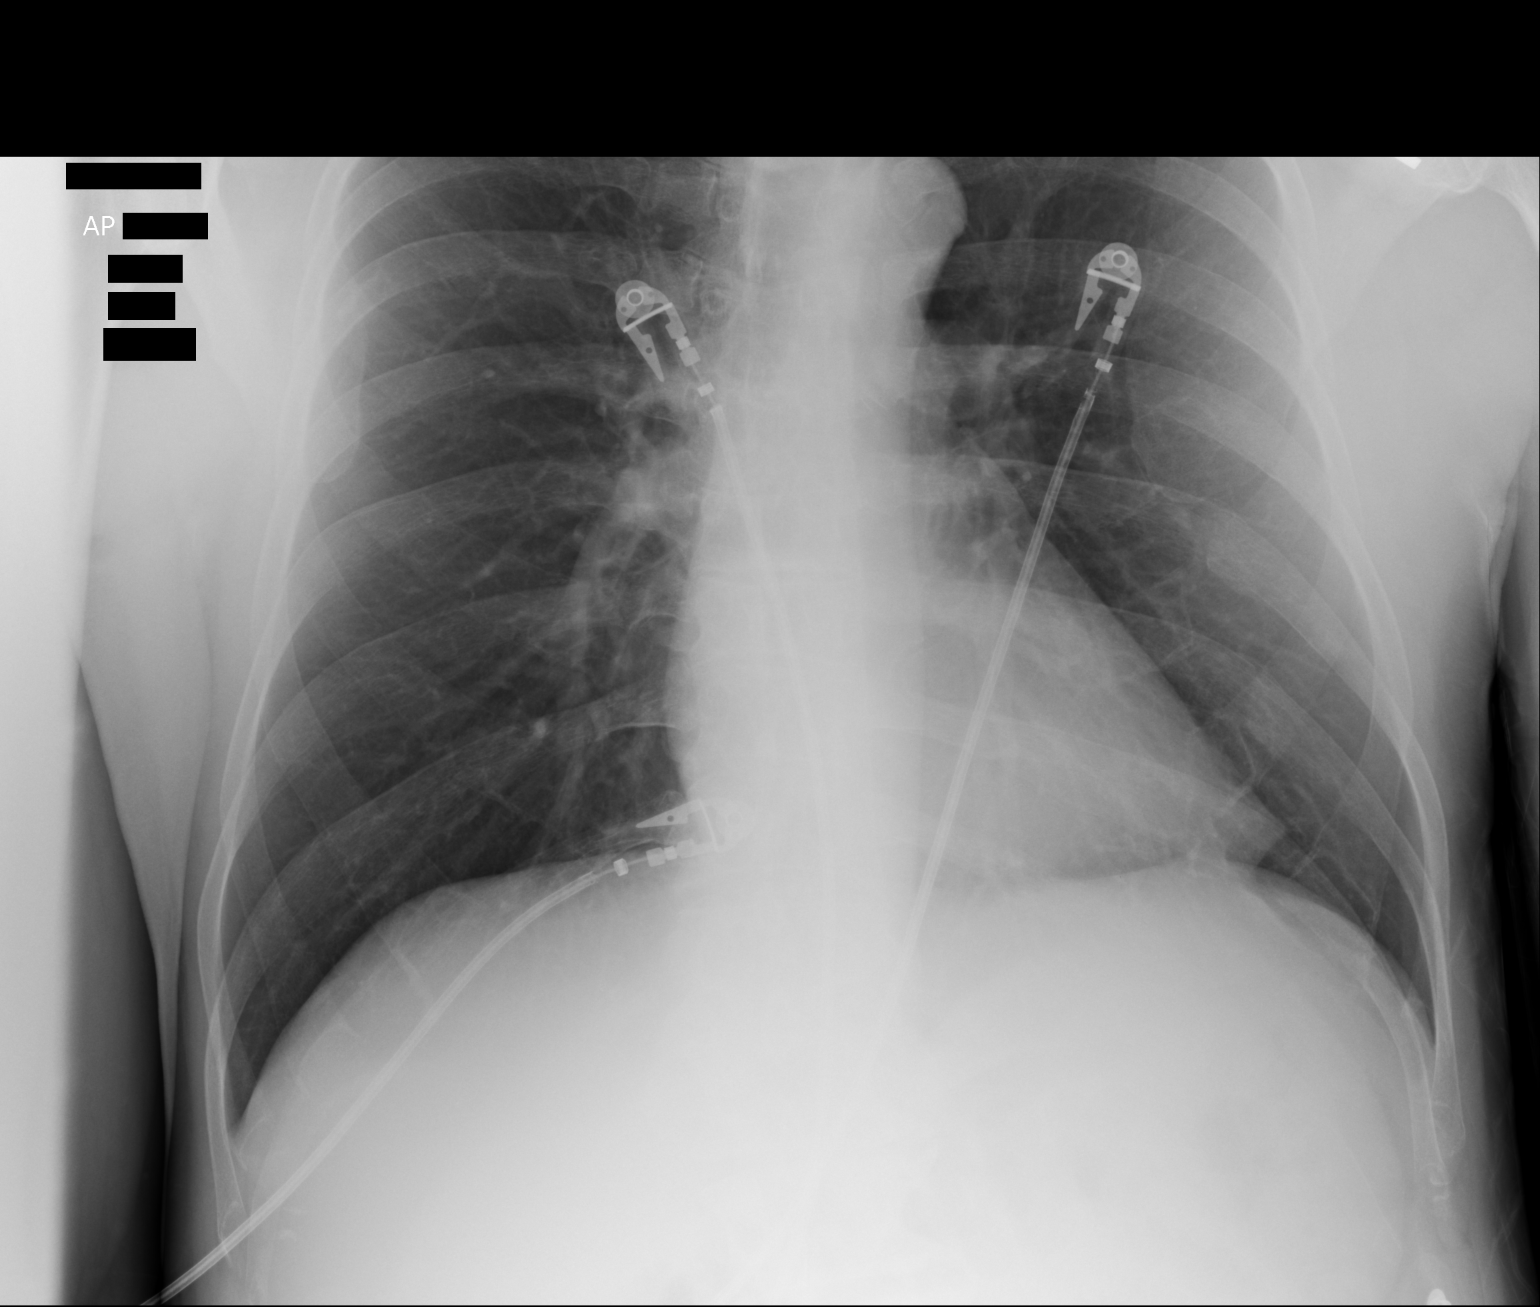

[2 of 2 positions shown; findings below may reference images not displayed]

FINDINGS: The lungs are mildly hyperinflated and clear. The heart and
mediastinal structures are normal. There is no pleural effusion or
pneumothorax. The bony thorax is unremarkable.
IMPRESSION: There is no acute cardiopulmonary abnormality.

## 2015-09-03 ENCOUNTER — Other Ambulatory Visit: Payer: Self-pay

## 2015-09-03 ENCOUNTER — Emergency Department
Admission: EM | Admit: 2015-09-03 | Discharge: 2015-09-03 | Disposition: A | Discharge status: Home / Self Care | Payer: Self-pay | Attending: Emergency Medicine | Admitting: Emergency Medicine

## 2015-09-03 DIAGNOSIS — G8929 Other chronic pain: Secondary | ICD-10-CM | POA: Insufficient documentation

## 2015-09-03 DIAGNOSIS — F10920 Alcohol use, unspecified with intoxication, uncomplicated: Secondary | ICD-10-CM

## 2015-09-03 DIAGNOSIS — F1490 Cocaine use, unspecified, uncomplicated: Secondary | ICD-10-CM

## 2015-09-03 DIAGNOSIS — R109 Unspecified abdominal pain: Secondary | ICD-10-CM | POA: Insufficient documentation

## 2015-09-03 DIAGNOSIS — I10 Essential (primary) hypertension: Secondary | ICD-10-CM | POA: Insufficient documentation

## 2015-09-03 DIAGNOSIS — F10120 Alcohol abuse with intoxication, uncomplicated: Secondary | ICD-10-CM | POA: Insufficient documentation

## 2015-09-03 DIAGNOSIS — F1410 Cocaine abuse, uncomplicated: Secondary | ICD-10-CM | POA: Insufficient documentation

## 2015-09-03 HISTORY — DX: Essential (primary) hypertension: I10

## 2015-09-03 HISTORY — DX: Chronic viral hepatitis C: B18.2

## 2015-09-03 LAB — URINE DRUG SCREEN, QUALITATIVE (ARMC ONLY)
AMPHETAMINES, UR SCREEN: NOT DETECTED
BENZODIAZEPINE, UR SCRN: NOT DETECTED
Barbiturates, Ur Screen: NOT DETECTED
COCAINE METABOLITE, UR ~~LOC~~: POSITIVE — AB
Cannabinoid 50 Ng, Ur ~~LOC~~: NOT DETECTED
MDMA (Ecstasy)Ur Screen: NOT DETECTED
METHADONE SCREEN, URINE: NOT DETECTED
OPIATE, UR SCREEN: NOT DETECTED
Phencyclidine (PCP) Ur S: NOT DETECTED
TRICYCLIC, UR SCREEN: NOT DETECTED

## 2015-09-03 LAB — URINALYSIS COMPLETE WITH MICROSCOPIC (ARMC ONLY)
BACTERIA UA: NONE SEEN
Bilirubin Urine: NEGATIVE
Glucose, UA: NEGATIVE mg/dL
HGB URINE DIPSTICK: NEGATIVE
KETONES UR: NEGATIVE mg/dL
LEUKOCYTES UA: NEGATIVE
NITRITE: NEGATIVE
PH: 5 (ref 5.0–8.0)
PROTEIN: NEGATIVE mg/dL
Specific Gravity, Urine: 1.004 — ABNORMAL LOW (ref 1.005–1.030)
WBC, UA: NONE SEEN WBC/hpf (ref 0–5)

## 2015-09-03 LAB — COMPREHENSIVE METABOLIC PANEL
ALT: 42 U/L (ref 17–63)
ANION GAP: 9 (ref 5–15)
AST: 67 U/L — ABNORMAL HIGH (ref 15–41)
Albumin: 4.2 g/dL (ref 3.5–5.0)
Alkaline Phosphatase: 111 U/L (ref 38–126)
BUN: 7 mg/dL (ref 6–20)
CHLORIDE: 104 mmol/L (ref 101–111)
CO2: 25 mmol/L (ref 22–32)
Calcium: 9.1 mg/dL (ref 8.9–10.3)
Creatinine, Ser: 0.72 mg/dL (ref 0.61–1.24)
Glucose, Bld: 114 mg/dL — ABNORMAL HIGH (ref 65–99)
POTASSIUM: 3.8 mmol/L (ref 3.5–5.1)
Sodium: 138 mmol/L (ref 135–145)
Total Bilirubin: 0.2 mg/dL — ABNORMAL LOW (ref 0.3–1.2)
Total Protein: 8 g/dL (ref 6.5–8.1)

## 2015-09-03 LAB — CBC
HEMATOCRIT: 44.3 % (ref 40.0–52.0)
HEMOGLOBIN: 15.5 g/dL (ref 13.0–18.0)
MCH: 33.9 pg (ref 26.0–34.0)
MCHC: 35 g/dL (ref 32.0–36.0)
MCV: 96.9 fL (ref 80.0–100.0)
Platelets: 116 10*3/uL — ABNORMAL LOW (ref 150–440)
RBC: 4.57 MIL/uL (ref 4.40–5.90)
RDW: 13.1 % (ref 11.5–14.5)
WBC: 6.8 10*3/uL (ref 3.8–10.6)

## 2015-09-03 LAB — LIPASE, BLOOD: Lipase: 46 U/L (ref 11–51)

## 2015-09-03 LAB — TROPONIN I: Troponin I: <0.03 ng/mL (ref <0.031)

## 2015-09-03 LAB — ETHANOL: ALCOHOL ETHYL (B): 297 mg/dL — AB (ref <5)

## 2015-09-03 NOTE — ED Notes (Signed)
Pt presents to ED via [facility] with c/o chest pain x4 months, with associated numbness in the RT arm. Pt also with c/o abdominal pain d/t "Hep C". Pt also requesting detox for ETOH and cocaine abuse. Pt reports last use of ETOH was just PTA of EMS; last cocaine use was around 4-5pm. Pt is in NAD, A&Ox4, with respirations regular, even and unlabored.

## 2015-09-03 NOTE — ED Notes (Signed)
Patient dressed and escorted by tech to lobby. Officer in lobby notified of Mr. Jose [name] presence in lobby and has been discharged.

## 2015-09-03 NOTE — Discharge Instructions (Signed)
1. Stop using illegal drugs. 2. Drink alcohol only in moderation. 3. You have declined a bed at [facility] for alcohol and cocaine detox. Return to the ER for worsening symptoms, persistent vomiting, feelings of hurting yourself or others, or other concerns.  Alcohol Intoxication Alcohol intoxication occurs when the amount of alcohol that a person has consumed impairs his or her ability to mentally and physically function. Alcohol directly impairs the normal chemical activity of the brain. Drinking large amounts of alcohol can lead to changes in mental function and behavior, and it can cause many physical effects that can be harmful.  Alcohol intoxication can range in severity from mild to very severe. Various factors can affect the level of intoxication that occurs, such as the person's age, gender, weight, frequency of alcohol consumption, and the presence of other medical conditions (such as diabetes, seizures, or heart conditions). Dangerous levels of alcohol intoxication may occur when people drink large amounts of alcohol in a short period (binge drinking). Alcohol can also be especially dangerous when combined with certain prescription medicines or "recreational" drugs. SIGNS AND SYMPTOMS Some common signs and symptoms of mild alcohol intoxication include:  Loss of coordination.  Changes in mood and behavior.  Impaired judgment.  Slurred speech. As alcohol intoxication progresses to more severe levels, other signs and symptoms will appear. These may include:  Vomiting.  Confusion and impaired memory.  Slowed breathing.  Seizures.  Loss of consciousness. DIAGNOSIS  Your health care provider will take a medical history and perform a physical exam. You will be asked about the amount and type of alcohol you have consumed. Blood tests will be done to measure the concentration of alcohol in your blood. In many places, your blood alcohol level must be lower than 80 mg/dL (2.95%) to legally  drive. However, many dangerous effects of alcohol can occur at much lower levels.  TREATMENT  People with alcohol intoxication often do not require treatment. Most of the effects of alcohol intoxication are temporary, and they go away as the alcohol naturally leaves the body. Your health care provider will monitor your condition until you are stable enough to go home. Fluids are sometimes given through an IV access tube to help prevent dehydration.  HOME CARE INSTRUCTIONS  Do not drive after drinking alcohol.  Stay hydrated. Drink enough water and fluids to keep your urine clear or pale yellow. Avoid caffeine.   Only take over-the-counter or prescription medicines as directed by your health care provider.  SEEK MEDICAL CARE IF:   You have persistent vomiting.   You do not feel better after a few days.  You have frequent alcohol intoxication. Your health care provider can help determine if you should see a substance use treatment counselor. SEEK IMMEDIATE MEDICAL CARE IF:   You become shaky or tremble when you try to stop drinking.   You shake uncontrollably (seizure).   You throw up (vomit) blood. This may be bright red or may look like black coffee grounds.   You have blood in your stool. This may be bright red or may appear as a black, tarry, bad smelling stool.   You become lightheaded or faint.  MAKE SURE YOU:   Understand these instructions.  Will watch your condition.  Will get help right away if you are not doing well or get worse.   This information is not intended to replace advice given to you by your health care provider. Make sure you discuss any questions you have with your  health care provider.   Document Released: 06/20/2005 Document Revised: 05/13/2013 Document Reviewed: 02/13/2013 Elsevier Interactive Patient Education 2016 Elsevier Inc.  Stimulant Use Disorder-Cocaine Cocaine is one of a group of powerful drugs called stimulants. Cocaine has  medical uses for stopping nosebleeds and for pain control before minor nose or dental surgery. However, cocaine is misused because of the effects that it produces. These effects include:   A feeling of extreme pleasure.  Alertness.  High energy. Common street names for cocaine include coke, crack, blow, snow, and nose candy. Cocaine is snorted, dissolved in water and injected, or smoked.  Stimulants are addictive because they activate regions of the brain that produce both the pleasurable sensation of "reward" and psychological dependence. Together, these actions account for loss of control and the rapid development of drug dependence. This means you become ill without the drug (withdrawal) and need to keep using it to function.  Stimulant use disorder is use of stimulants that disrupts your daily life. It disrupts relationships with family and friends and how you do your job. Cocaine increases your blood pressure and heart rate. It can cause a heart attack or stroke. Cocaine can also cause death from irregular heart rate or seizures. SYMPTOMS Symptoms of stimulant use disorder with cocaine include:  Use of cocaine in larger amounts or over a longer period of time than intended.  Unsuccessful attempts to cut down or control cocaine use.  A lot of time spent obtaining, using, or recovering from the effects of cocaine.  A strong desire or urge to use cocaine (craving).  Continued use of cocaine in spite of major problems at work, school, or home because of use.  Continued use of cocaine in spite of relationship problems because of use.  Giving up or cutting down on important life activities because of cocaine use.  Use of cocaine over and over in situations when it is physically hazardous, such as driving a car.  Continued use of cocaine in spite of a physical problem that is likely related to use. Physical problems can include:  Malnutrition.  Nosebleeds.  Chest pain.  High blood  pressure.  A hole that develops between the part of your nose that separates your nostrils (perforated nasal septum).  Lung and kidney damage.  Continued use of cocaine in spite of a mental problem that is likely related to use. Mental problems can include:  Schizophrenia-like symptoms.  Depression.  Bipolar mood swings.  Anxiety.  Sleep problems.  Need to use more and more cocaine to get the same effect, or lessened effect over time with use of the same amount of cocaine (tolerance).  Having withdrawal symptoms when cocaine use is stopped, or using cocaine to reduce or avoid withdrawal symptoms. Withdrawal symptoms include:  Depressed or irritable mood.  Low energy or restlessness.  Bad dreams.  Poor or excessive sleep.  Increased appetite. DIAGNOSIS Stimulant use disorder is diagnosed by your health care provider. You may be asked questions about your cocaine use and how it affects your life. A physical exam may be done. A drug screen may be ordered. You may be referred to a mental health professional. The diagnosis of stimulant use disorder requires at least two symptoms within 12 months. The type of stimulant use disorder depends on the number of signs and symptoms you have. The type may be:  Mild. Two or three signs and symptoms.  Moderate. Four or five signs and symptoms.  Severe. Six or more signs and symptoms. TREATMENT  Treatment for stimulant use disorder is usually provided by mental health professionals with training in substance use disorders. The following options are available:  Counseling or talk therapy. Talk therapy addresses the reasons you use cocaine and ways to keep you from using again. Goals of talk therapy include:  Identifying and avoiding triggers for use.  Handling cravings.  Replacing use with healthy activities.  Support groups. Support groups provide emotional support, advice, and guidance.  Medicine. Certain medicines may decrease  cocaine cravings or withdrawal symptoms. HOME CARE INSTRUCTIONS  Take medicines only as directed by your health care provider.  Identify the people and activities that trigger your cocaine use and avoid them.  Keep all follow-up visits as directed by your health care provider. SEEK MEDICAL CARE IF:  Your symptoms get worse or you relapse.  You are not able to take medicines as directed. SEEK IMMEDIATE MEDICAL CARE IF:  You have serious thoughts about hurting yourself or others.  You have a seizure, chest pain, sudden weakness, or loss of speech or vision. FOR MORE INFORMATION  National Institute on Drug Abuse: http://www.price-smith.com/www.drugabuse.gov  Substance Abuse and Mental Health Services Administration: SkateOasis.com.ptwww.samhsa.gov   This information is not intended to replace advice given to you by your health care provider. Make sure you discuss any questions you have with your health care provider.   Document Released: 09/07/2000 Document Revised: 10/01/2014 Document Reviewed: 09/23/2013 Elsevier Interactive Patient Education [employer] Elsevier Inc.

## 2015-09-03 NOTE — ED Notes (Signed)
BEHAVIORAL HEALTH ROUNDING Patient sleeping: No. Patient alert and oriented: yes Behavior appropriate: Yes.  ; If no, describe:  Nutrition and fluids offered: Yes  Toileting and hygiene offered: Yes  Sitter present: not applicable Law enforcement present: Yes  

## 2015-09-03 NOTE — BHH Counselor (Signed)
Referral submitted to [facility] for review.

## 2015-09-03 NOTE — BHH Counselor (Signed)
Spoke to patient to inform him that [facility] has a bed available.  Pt has declined treatment.

## 2015-09-03 NOTE — ED Provider Notes (Signed)
[facility] Emergency Department Provider Note  ____________________________________________  Time seen: Approximately 2:24 AM  I have reviewed the triage vital signs and the nursing notes.   HISTORY  Chief Complaint Medical Clearance and Chest Pain    HPI Jose [name] is a 54 y.o. male who [place] with a chief complaint of chest pain, abdominal pain, alcohol and cocaine detox. Patient states he has had a 4 month history of daily chest pain and chronic abdominal pain secondary to hepatitis C. Patient is also requesting detox for alcohol and cocaine use. Denies history of DTs. States last drink was prior to arrival and last use of cocaine was approximately 5 PM. He also notes [place] and would be better off if he could just spend the night. Denies active SI/HI/AH/VH.Patient is homeless and [place] the woods.   Past Medical History  Diagnosis Date  . Hypertension   . Hep C w/o coma, chronic (HCC)     There are no active problems to display for this patient.   History reviewed. No pertinent past surgical history.  No current outpatient prescriptions on file.  Allergies Review of patient's allergies indicates no known allergies.  No family history on file.  Social History Social History  Substance Use Topics  . Smoking status: None  . Smokeless tobacco: None  . Alcohol Use: None    Review of Systems Constitutional: No fever/chills Eyes: No visual changes. ENT: No sore throat. Cardiovascular: Positive for chest pain. Respiratory: Denies shortness of breath. Gastrointestinal: Positive for abdominal pain.  No nausea, no vomiting.  No diarrhea.  No constipation. Genitourinary: Negative for dysuria. Musculoskeletal: Negative for back pain. Skin: Negative for rash. Neurological: Negative for headaches, focal weakness or numbness. Psychiatric:Positive for substance use.  10-point ROS  otherwise negative.  ____________________________________________   PHYSICAL EXAM:  VITAL SIGNS: ED Triage Vitals  Enc Vitals Group     BP 09/03/15 0127 144/86 mmHg     Pulse Rate 09/03/15 0127 108     Resp 09/03/15 0127 16     Temp 09/03/15 0127 97.8 F (36.6 C)     Temp Source 09/03/15 0127 Oral     SpO2 09/03/15 0120 96 %     Weight 09/03/15 0127 145 lb (65.772 kg)     Height 09/03/15 0127 5\' 8"  (1.727 m)     Head Cir --      Peak Flow --      Pain Score 09/03/15 0128 8     Pain Loc --      Pain Edu? --      Excl. in GC? --     Constitutional: Alert and oriented. Well appearing and in no acute distress. Eyes: Conjunctivae are normal. PERRL. EOMI. Head: Atraumatic. Nose: No congestion/rhinnorhea. Mouth/Throat: Mucous membranes are moist.  Oropharynx mildly erythematous. There is no tonsillar swelling, exudate or peritonsillar abscess. There is no hoarse or muffled voice. There is no drooling. Neck: No stridor.   Cardiovascular: Normal rate, regular rhythm. Grossly normal heart sounds.  Good peripheral circulation. Respiratory: Normal respiratory effort.  No retractions. Lungs CTAB. Dry cough noted. Gastrointestinal: Soft and nontender. No distention. No abdominal bruits. No CVA tenderness. Musculoskeletal: No lower extremity tenderness nor edema.  No joint effusions. Neurologic:  Normal speech and language. No gross focal neurologic deficits are appreciated. No gait instability. Skin:  Skin is warm, dry and intact. No rash noted. Psychiatric: Mood and  affect are normal. Speech and behavior are normal.  ____________________________________________   LABS (all labs ordered are listed, but only abnormal results are displayed)  Labs Reviewed  COMPREHENSIVE METABOLIC PANEL - Abnormal; Notable for the following:    Glucose, Bld 114 (*)    AST 67 (*)    Total Bilirubin 0.2 (*)    All other components within normal limits  ETHANOL - Abnormal; Notable for the following:     Alcohol, Ethyl (B) 297 (*)    All other components within normal limits  CBC - Abnormal; Notable for the following:    Platelets 116 (*)    All other components within normal limits  URINE DRUG SCREEN, QUALITATIVE ([facility] ONLY) - Abnormal; Notable for the following:    Cocaine Metabolite,Ur Kimberly POSITIVE (*)    All other components within normal limits  URINALYSIS COMPLETEWITH MICROSCOPIC ([facility] ONLY) - Abnormal; Notable for the following:    Color, Urine STRAW (*)    APPearance CLEAR (*)    Specific Gravity, Urine 1.004 (*)    Squamous Epithelial / LPF 0-5 (*)    All other components within normal limits  TROPONIN I  LIPASE, BLOOD   ____________________________________________  EKG  ED ECG REPORT I, SUNG,JADE J, the attending physician, personally viewed and interpreted this ECG.   Date: 09/03/2015  EKG Time: 0133  Rate: 77  Rhythm: normal EKG, normal sinus rhythm  Axis: Normal  Intervals:right bundle branch block  ST&T Change: Nonspecific  ____________________________________________  RADIOLOGY  None ____________________________________________   PROCEDURES  Procedure(s) performed: None  Critical Care performed: No  ____________________________________________   INITIAL IMPRESSION / ASSESSMENT AND PLAN / ED COURSE  Pertinent labs & imaging results that were available during my care of the patient were reviewed by me and considered in my medical decision making (see chart for details).  54 year old male with a history of substance abuse who presents requesting detox from alcohol and cocaine. Given his complaint of a 4 month history of constant chest pain, will obtain EKG, screening lab work including troponin. Will obtain [facility]TS consult.  ----------------------------------------- 5:00 AM on 09/03/2015 -----------------------------------------  Patient was evaluated by [facility] and referred to [facility]. [facility] has a bed available for patients but requires EtOH less than 147  for acceptance. Patient has declined [facility]. He was unhappy that he was moved into the hallway secondary to acute patient which required his room. Patient threatened to leave and "walk back into the woods". He is ambulating with steady gait, alert and oriented. However, given it is freezing and dark outside, I spoke with him about waiting until daylight to be discharged. He was placed back into a room for his comfort pending discharge in the morning.  ----------------------------------------- 7:08 AM on 09/03/2015 -----------------------------------------  Patient currently sleeping in no acute distress. Plan to discharge home once it is light outside. Strict return precautions given. Patient verbalizes understanding and agrees with plan of care. ____________________________________________   FINAL CLINICAL IMPRESSION(S) / ED DIAGNOSES  Final diagnoses:  Alcohol intoxication, uncomplicated (HCC)  Cocaine use      Irean Hong, MD 09/03/15 [id number]

## 2015-09-03 NOTE — ED Notes (Signed)

## 2015-09-03 NOTE — ED Notes (Signed)
Patient wishes to keep door closed and sleep. Alert and oriented. Breakfast served. Door kept open. Pt states ready for discharge. MD notified and patient readied for discharge.

## 2015-09-05 ENCOUNTER — Emergency Department
Admission: EM | Admit: 2015-09-05 | Discharge: 2015-09-07 | Disposition: A | Discharge status: Other Acute Inpatient Hospital | Payer: Self-pay | Attending: Emergency Medicine | Admitting: Emergency Medicine

## 2015-09-05 DIAGNOSIS — F329 Major depressive disorder, single episode, unspecified: Secondary | ICD-10-CM | POA: Insufficient documentation

## 2015-09-05 DIAGNOSIS — R45851 Suicidal ideations: Secondary | ICD-10-CM

## 2015-09-05 DIAGNOSIS — I10 Essential (primary) hypertension: Secondary | ICD-10-CM | POA: Insufficient documentation

## 2015-09-05 DIAGNOSIS — R0981 Nasal congestion: Secondary | ICD-10-CM | POA: Insufficient documentation

## 2015-09-05 DIAGNOSIS — B1920 Unspecified viral hepatitis C without hepatic coma: Secondary | ICD-10-CM

## 2015-09-05 DIAGNOSIS — R062 Wheezing: Secondary | ICD-10-CM | POA: Insufficient documentation

## 2015-09-05 DIAGNOSIS — F1010 Alcohol abuse, uncomplicated: Secondary | ICD-10-CM | POA: Insufficient documentation

## 2015-09-05 DIAGNOSIS — R1030 Lower abdominal pain, unspecified: Secondary | ICD-10-CM | POA: Insufficient documentation

## 2015-09-05 DIAGNOSIS — F1410 Cocaine abuse, uncomplicated: Secondary | ICD-10-CM | POA: Insufficient documentation

## 2015-09-05 DIAGNOSIS — F17200 Nicotine dependence, unspecified, uncomplicated: Secondary | ICD-10-CM | POA: Insufficient documentation

## 2015-09-05 NOTE — ED Notes (Signed)
Pt bib EMS w/ reports of SI.  Per EMS, pt was picked up at service station.  Pt reports drink 8 16 oz beers today.  Pt reports thoughts of hurting himself.  Per EMS, pt is homeless and was seen here last Thursday.  Pt A/Ox4, NAD, able to ambulate to BR independently.

## 2015-09-06 ENCOUNTER — Emergency Department: Payer: Self-pay

## 2015-09-06 DIAGNOSIS — B1920 Unspecified viral hepatitis C without hepatic coma: Secondary | ICD-10-CM

## 2015-09-06 DIAGNOSIS — F1994 Other psychoactive substance use, unspecified with psychoactive substance-induced mood disorder: Secondary | ICD-10-CM

## 2015-09-06 LAB — COMPREHENSIVE METABOLIC PANEL
ALK PHOS: 105 U/L (ref 38–126)
ALT: 48 U/L (ref 17–63)
AST: 69 U/L — AB (ref 15–41)
Albumin: 4.4 g/dL (ref 3.5–5.0)
Anion gap: 7 (ref 5–15)
BUN: 10 mg/dL (ref 6–20)
CALCIUM: 9.8 mg/dL (ref 8.9–10.3)
CHLORIDE: 100 mmol/L — AB (ref 101–111)
CO2: 30 mmol/L (ref 22–32)
CREATININE: 0.76 mg/dL (ref 0.61–1.24)
Glucose, Bld: 92 mg/dL (ref 65–99)
Potassium: 4 mmol/L (ref 3.5–5.1)
SODIUM: 137 mmol/L (ref 135–145)
Total Bilirubin: 0.5 mg/dL (ref 0.3–1.2)
Total Protein: 8.2 g/dL — ABNORMAL HIGH (ref 6.5–8.1)

## 2015-09-06 LAB — URINALYSIS COMPLETE WITH MICROSCOPIC (ARMC ONLY)
BACTERIA UA: NONE SEEN
Bilirubin Urine: NEGATIVE
Glucose, UA: NEGATIVE mg/dL
Hgb urine dipstick: NEGATIVE
Ketones, ur: NEGATIVE mg/dL
Leukocytes, UA: NEGATIVE
NITRITE: NEGATIVE
PROTEIN: NEGATIVE mg/dL
RBC / HPF: NONE SEEN RBC/hpf (ref 0–5)
SPECIFIC GRAVITY, URINE: 1.002 — AB (ref 1.005–1.030)
SQUAMOUS EPITHELIAL / LPF: NONE SEEN
WBC UA: NONE SEEN WBC/hpf (ref 0–5)
pH: 6 (ref 5.0–8.0)

## 2015-09-06 LAB — URINE DRUG SCREEN, QUALITATIVE (ARMC ONLY)
Amphetamines, Ur Screen: NOT DETECTED
BARBITURATES, UR SCREEN: NOT DETECTED
Benzodiazepine, Ur Scrn: NOT DETECTED
CANNABINOID 50 NG, UR ~~LOC~~: NOT DETECTED
COCAINE METABOLITE, UR ~~LOC~~: POSITIVE — AB
MDMA (ECSTASY) UR SCREEN: NOT DETECTED
Methadone Scn, Ur: NOT DETECTED
OPIATE, UR SCREEN: NOT DETECTED
PHENCYCLIDINE (PCP) UR S: NOT DETECTED
Tricyclic, Ur Screen: NOT DETECTED

## 2015-09-06 LAB — ETHANOL: Alcohol, Ethyl (B): 245 mg/dL — ABNORMAL HIGH (ref <5)

## 2015-09-06 LAB — CBC
HEMATOCRIT: 46 % (ref 40.0–52.0)
HEMOGLOBIN: 15.5 g/dL (ref 13.0–18.0)
MCH: 32.6 pg (ref 26.0–34.0)
MCHC: 33.7 g/dL (ref 32.0–36.0)
MCV: 96.7 fL (ref 80.0–100.0)
Platelets: 115 10*3/uL — ABNORMAL LOW (ref 150–440)
RBC: 4.75 MIL/uL (ref 4.40–5.90)
RDW: 13.1 % (ref 11.5–14.5)
WBC: 7.2 10*3/uL (ref 3.8–10.6)

## 2015-09-06 LAB — SALICYLATE LEVEL: Salicylate Lvl: <4 mg/dL (ref 2.8–30.0)

## 2015-09-06 LAB — ACETAMINOPHEN LEVEL: Acetaminophen (Tylenol), Serum: <10 ug/mL — ABNORMAL LOW (ref 10–30)

## 2015-09-06 MED ORDER — LORAZEPAM 2 MG PO TABS: 0.0000 mg | ORAL_TABLET | Freq: Four times a day (QID) | ORAL | Status: DC

## 2015-09-06 MED ORDER — VITAMIN B-1 100 MG PO TABS
100.0000 mg | ORAL_TABLET | Freq: Every day | ORAL | Status: DC
  Administered 2015-09-06 – 2015-09-07 (×2): 100 mg via ORAL
  Filled 2015-09-06 (×2): qty 1

## 2015-09-06 MED ORDER — LORAZEPAM 2 MG PO TABS: 0.0000 mg | ORAL_TABLET | Freq: Two times a day (BID) | ORAL | Status: DC

## 2015-09-06 MED ORDER — LORAZEPAM 2 MG PO TABS
2.0000 mg | ORAL_TABLET | Freq: Once | ORAL | Status: AC
  Administered 2015-09-06: 2 mg via ORAL
  Filled 2015-09-06: qty 1

## 2015-09-06 MED ORDER — TRAMADOL HCL 50 MG PO TABS
50.0000 mg | ORAL_TABLET | Freq: Four times a day (QID) | ORAL | Status: DC | PRN
Start: 2015-09-06 — End: 2015-09-07

## 2015-09-06 NOTE — ED Notes (Signed)
Pt. Noted in room sleeping;. No complaints or concerns voiced. No distress or abnormal behavior noted. Will continue to monitor with security cameras. Q 15 minute rounds continue. 

## 2015-09-06 NOTE — ED Notes (Signed)
Report received from Amy B., RN. Pt. Alert and oriented in no distress denies SI, HI, AVH and pain.  Pt. Instructed to come to me with problems or concerns.Will continue to monitor for safety via security cameras and Q 15 minute checks. 

## 2015-09-06 NOTE — ED Notes (Signed)
Pt. To [facility] from ED ambulatory without difficulty, to [facility] 4. [facility] Pt. Is alert and oriented, warm and dry in no distress. Pt. Denies SI, HI, and AVH. Pt. Calm and cooperative. Pt. Made aware of security cameras and Q15 minute rounds. Pt. Encouraged to let Nursing staff know of any concerns or needs. Snack and beverage given.

## 2015-09-06 NOTE — ED Notes (Signed)
Pt continues to sleep. No respiratory distress. Will continue 15 minute checks and observation by security cameras for safety.

## 2015-09-06 NOTE — ED Notes (Signed)
Patient resting quietly in room. No noted distress or abnormal behaviors noted. Will continue 15 minute checks and observation by security camera for safety. 

## 2015-09-06 NOTE — Progress Notes (Signed)
Spoke to patient to inform him that [facility] has a bed available. Pt has declined treatment.  09/06/2015 Cheryl FlashNicole Dorothyann Mourer, MS, NCC, LPCA Therapeutic Triage Specialist

## 2015-09-06 NOTE — ED Notes (Signed)
Patient asleep in room. No noted distress or abnormal behavior. Will continue 15 minute checks and observation by security cameras for safety. 

## 2015-09-06 NOTE — ED Notes (Signed)
Up to the bathroom; no withdrawal symptoms noted; with a steady gait; fluids offered; client refused.

## 2015-09-06 NOTE — ED Provider Notes (Signed)
[facility] Emergency Department Provider Note  ____________________________________________  Time seen: Approximately 0041 AM  I have reviewed the triage vital signs and the nursing notes.   HISTORY  Chief Complaint Suicidal    HPI Jose [name] is a 54 y.o. male who comes into the hospital today reporting that he has a bunch of issues. The patient reports that he has stings on his mind. He reports that recently a friend of his was killed in a car accident and his mom died last year. The patient reports that he is homeless and living in the woods and has a bad. He does not want to hurt anyone but he reports he has been having thoughts of hurting himself. The patient wants to get his life together so he decided to come in to get checked out. He also reports that he has not been breathing right and wants to get it checked out. He has been congested and wheezing but reports that he does smoke. He drinks daily and had 8 beers today. The patient reports that he also did cocaine yesterday and is not sure what else can go wrong in his life. The patient denies any auditory hallucinations but is depressed.The patient does not have a plan on how to kill himself.   Past Medical History  Diagnosis Date  . Hypertension   . Hep C w/o coma, chronic (HCC)     There are no active problems to display for this patient.   History reviewed. No pertinent past surgical history.  No current outpatient prescriptions on file.  Allergies Review of patient's allergies indicates no known allergies.  No family history on file.  Social History Social History  Substance Use Topics  . Smoking status: Current Every Day Smoker -- 0.50 packs/day  . Smokeless tobacco: None  . Alcohol Use: Yes    Review of Systems Constitutional: No fever/chills Eyes: No visual changes. ENT: No sore throat. Cardiovascular: Denies chest pain. Respiratory: Wheezing Gastrointestinal: Suprapubic  abdominal pain.  No nausea, no vomiting.  No diarrhea.  No constipation. Genitourinary: Negative for dysuria. Musculoskeletal: Negative for back pain. Skin: Negative for rash. Neurological: Negative for headaches, focal weakness or numbness.  10-point ROS otherwise negative.  ____________________________________________   PHYSICAL EXAM:  VITAL SIGNS: ED Triage Vitals  Enc Vitals Group     BP 09/06/15 0006 145/98 mmHg     Pulse Rate 09/06/15 0006 98     Resp 09/06/15 0006 16     Temp 09/06/15 0006 97.9 F (36.6 C)     Temp Source 09/06/15 0006 Oral     SpO2 09/06/15 0006 100 %     Weight 09/06/15 0006 145 lb (65.772 kg)     Height 09/06/15 0006 5\' 8"  (1.727 m)     Head Cir --      Peak Flow --      Pain Score 09/05/15 2359 8     Pain Loc --      Pain Edu? --      Excl. in GC? --     Constitutional: Alert and oriented. Well appearing and in no acute distress. Eyes: Conjunctivae are normal. PERRL. EOMI. Head: Atraumatic. Nose: No congestion/rhinnorhea. Mouth/Throat: Mucous membranes are moist.  Oropharynx non-erythematous. Cardiovascular: Normal rate, regular rhythm. Grossly normal heart sounds.  Good peripheral circulation. Respiratory: Normal respiratory effort.  No retractions. Lungs CTAB. Gastrointestinal: Soft with mild suprapubic tenderness to palpation. No distention.  Musculoskeletal: No lower extremity tenderness nor edema.  Neurologic:  Normal speech and language.  Skin:  Skin is warm, dry and intact.  Psychiatric: Mood and affect are normal.   ____________________________________________   LABS (all labs ordered are listed, but only abnormal results are displayed)  Labs Reviewed  COMPREHENSIVE METABOLIC PANEL - Abnormal; Notable for the following:    Chloride 100 (*)    Total Protein 8.2 (*)    AST 69 (*)    All other components within normal limits  ETHANOL - Abnormal; Notable for the following:    Alcohol, Ethyl (B) 245 (*)    All other components  within normal limits  ACETAMINOPHEN LEVEL - Abnormal; Notable for the following:    Acetaminophen (Tylenol), Serum <10 (*)    All other components within normal limits  CBC - Abnormal; Notable for the following:    Platelets 115 (*)    All other components within normal limits  URINE DRUG SCREEN, QUALITATIVE ([facility] ONLY) - Abnormal; Notable for the following:    Cocaine Metabolite,Ur [place] POSITIVE (*)    All other components within normal limits  URINALYSIS COMPLETEWITH MICROSCOPIC ([facility] ONLY) - Abnormal; Notable for the following:    Color, Urine COLORLESS (*)    APPearance CLEAR (*)    Specific Gravity, Urine 1.002 (*)    All other components within normal limits  SALICYLATE LEVEL   ____________________________________________  EKG  None ____________________________________________  RADIOLOGY  Chest x-ray: No acute cardiopulmonary process seen ____________________________________________   PROCEDURES  Procedure(s) performed: None  Critical Care performed: No  ____________________________________________   INITIAL IMPRESSION / ASSESSMENT AND PLAN / ED COURSE  Pertinent labs & imaging results that were available during my care of the patient were reviewed by me and considered in my medical decision making (see chart for details).  This is a 54 year old male who comes in today with suicidal ideation. The patient has multiple stressors going on in life and he does drink regularly. I will have the patient evaluated by TTS and then again by psych. I will involuntarily commit the patient to ensure that he does not try to leave the hospital prior to his evaluation. The patient did have urine checked for his suprapubic discomfort and it was negative. The patient also had a chest x-ray that was done for his wheezing which was negative. The patient does smoke and likely has some form of COPD which may be causing some of the intermittent shortness of breath that he  experiences. ____________________________________________   FINAL CLINICAL IMPRESSION(S) / ED DIAGNOSES  Final diagnoses:  Suicidal ideation      Rebecka Apley, MD 09/06/15 [phone]

## 2015-09-06 NOTE — ED Notes (Signed)
Pt eating breakfast in room. No noted distress or abnormal behaviors noted. Will continue 15 minute checks and observation by security camera for safety.

## 2015-09-06 NOTE — ED Notes (Signed)
Pt. Noted in room resting quietly;. No complaints or concerns voiced. No distress or abnormal behavior noted. Will continue to monitor with security cameras. Q 15 minute rounds continue. 

## 2015-09-06 NOTE — ED Notes (Signed)
Patient transported to X-ray 

## 2015-09-06 NOTE — ED Notes (Signed)
Pt. Noted in room lying on the bed. No complaints or concerns voiced. No distress or abnormal behavior noted. Will continue to monitor with security cameras. Q 15 minute rounds continue. 

## 2015-09-06 NOTE — ED Notes (Addendum)
Pt can not be discharged tonight. MD must resend IVC paperwork in the morning.

## 2015-09-06 NOTE — ED Notes (Signed)
ED BHU PLACEMENT JUSTIFICATION Is the patient under IVC or is there intent for IVC: No.   Is the patient medically cleared: Yes.   Is there vacancy in the ED BHU: Yes.   Is the population mix appropriate for patient: Yes.   Is the patient awaiting placement in inpatient or outpatient setting: Yes.   Has the patient had a psychiatric consult: Yes.   Survey of unit performed for contraband, proper placement and condition of furniture, tampering with fixtures in bathroom, shower, and each patient room: Yes.  ; Findings: All clear. APPEARANCE/BEHAVIOR calm, cooperative and adequate rapport can be established NEURO ASSESSMENT Orientation: time, place and person Hallucinations: No.None noted (Hallucinations) Speech: Normal Gait: normal RESPIRATORY ASSESSMENT Within normal limits. CARDIOVASCULAR ASSESSMENT Within normal limits GASTROINTESTINAL ASSESSMENT Within normal limits EXTREMITIES ROM of all joints is normal PLAN OF CARE Provide calm/safe environment. Vital signs assessed twice daily. ED BHU Assessment once each 12-hour shift. Collaborate with intake RN daily or as condition indicates. Assure the ED provider has rounded once each shift. Provide and encourage hygiene. Provide redirection as needed. Assess for escalating behavior; address immediately and inform ED provider.  Assess family dynamic and appropriateness for visitation as needed: Yes.  ; If necessary, describe findings:  Educate the patient/family about BHU procedures/visitation: Yes.  ; If necessary, describe findings: Patient calm and cooperative at this time, understanding of BHU rules and procedures.  Will continue to monitor.

## 2015-09-06 NOTE — BH Assessment (Signed)
Assessment Note  Jose [name] is an 54 y.o. male. Who presented to the ED today for a psychiatric consult and detox from alcohol. Pt was transported to the ED via EMS w/ reports of SI. Per EMS, pt was picked up at service station.  Pt reports thoughts of hurting himself, although pt did contract for safety with wirter and denies any intent at this time. Pt states that this is his first time having SI. Pt. denies any suicidal , plan or intent. Pt. denies the presence of any auditory or visual hallucinations at this time. Patient denies any other medical complaints. Pt did report a history of Hep C. Pt states that he is currently homeless and has experienced several life stressors which have contributed to his current state of depression. Pt reports that a good friend of his was killed in a car accident on yesterday and that he is still mourning the death of his mother. Pt reports that he has used/abuse alcohol for over 20 years. Pt reports drinking 8- 16 oz beers today.Pt last seen here at [facility] on this past Thursday.Pt was referred to RTS but declined services.    Diagnosis: Alcohol Use Disorder   Past Medical History:  Past Medical History  Diagnosis Date  . Hypertension   . Hep C w/o coma, chronic (HCC)     History reviewed. No pertinent past surgical history.  Family History: No family history on file.  Social History:  reports that he has been smoking.  He does not have any smokeless tobacco history on file. He reports that he drinks alcohol. He reports that he uses illicit drugs (Cocaine).   Additional Social History:  Alcohol / Drug Use Pain Medications: Denied (Denied) Prescriptions: Denied Over the Counter: Denied History of alcohol / drug use?: Yes Longest period of sobriety (when/how long): Unknown Negative Consequences of Use: Financial, Personal relationships Withdrawal Symptoms: DTs, Tremors, Sweats Substance #1 Name of Substance 1: alcohol 1 - Age of First Use: 15 1  - Amount (size/oz): "As much as I can get" "8 to 10 beers" 1 - Frequency: daily 1 - Duration: 30 years 1 - Last Use / Amount: 09/05/15 Substance #2 Name of Substance 2: Cocaine 2 - Age of First Use: 19 2 - Amount (size/oz): unsure 2 - Frequency: varied 2 - Last Use / Amount: 09/05/2015  CIWA: CIWA-Ar BP: (!) 152/95 mmHg Pulse Rate: 95 Nausea and Vomiting: no nausea and no vomiting Tactile Disturbances: none Tremor: not visible, but can be felt fingertip to fingertip Auditory Disturbances: not present Paroxysmal Sweats: barely perceptible sweating, palms moist Visual Disturbances: not present Anxiety: mildly anxious Headache, Fullness in Head: none present Agitation: normal activity Orientation and Clouding of Sensorium: oriented and can do serial additions CIWA-Ar Total: 3 COWS:    Allergies: No Known Allergies  Home Medications:  (Not in a hospital admission)  OB/GYN Status:  No LMP for male patient.  General Assessment Data Location of Assessment: [facility] ED TTS Assessment: In system Is this a Tele or Face-to-Face Assessment?: Face-to-Face Is this an Initial Assessment or a Re-assessment for this encounter?: Initial Assessment Marital status: Divorced [place] name: n/a Is patient pregnant?: No Pregnancy Status: No Living Arrangements: Other (Comment) (homeless) Can pt return to current living arrangement?: Yes Admission Status: Voluntary Is patient capable of signing voluntary admission?: Yes Referral Source: Self/Family/Friend Insurance type: None  Medical Screening Exam [facility] Walk-in ONLY) Medical Exam completed: Yes  Crisis Care Plan Living Arrangements: Other (Comment) (homeless) Legal Guardian:  (  N/A) Name of Psychiatrist: None Name of Therapist: None  Education Status Is patient currently in school?: No Current Grade: n/a Highest grade of school patient has completed: 10th Name of school: [employer] person: n/a  Risk to self with the  past 6 months Suicidal Ideation: No-Not Currently/Within Last 6 Months Has patient been a risk to self within the past 6 months prior to admission? : No Suicidal Intent: No Has patient had any suicidal intent within the past 6 months prior to admission? : No Is patient at risk for suicide?: No Suicidal Plan?: No Has patient had any suicidal plan within the past 6 months prior to admission? : No Access to Means: No What has been your use of drugs/alcohol within the last 12 months?: Alcohol  Previous Attempts/Gestures: No How many times?: 0 Other Self Harm Risks: None Triggers for Past Attempts: Unknown Intentional Self Injurious Behavior: None Family Suicide History: Unknown Recent stressful life event(s): Job Loss, Financial Problems, Trauma (Comment) (death of friends and family member ) Persecutory voices/beliefs?: No Depression: No Depression Symptoms: Despondent, Feeling worthless/self pity Substance abuse history and/or treatment for substance abuse?: Yes Suicide prevention information given to non-admitted patients: Yes  Risk to Others within the past 6 months Homicidal Ideation: No Does patient have any lifetime risk of violence toward others beyond the six months prior to admission? : No Thoughts of Harm to Others: No Current Homicidal Intent: No Current Homicidal Plan: No Access to Homicidal Means: No Identified Victim: None reported History of harm to others?: No Assessment of Violence: None Noted Violent Behavior Description: Denied Does patient have access to weapons?: No Criminal Charges Pending?: No Does patient have a court date: No Is patient on probation?: No  Psychosis Hallucinations: None noted Delusions: None noted  Mental Status Report Appearance/Hygiene: In scrubs Eye Contact: Fair Motor Activity: Freedom of movement Speech: Logical/coherent Level of Consciousness: Alert Mood: Anxious Affect: Blunted, Flat Anxiety Level: Moderate Thought  Processes: Coherent Judgement: Partial Orientation: Person, Place, Time, Situation Obsessive Compulsive Thoughts/Behaviors: None  Cognitive Functioning Concentration: Normal Memory: Recent Intact, Remote Intact IQ: Average Insight: Fair Impulse Control: Fair Appetite: Fair Sleep: Decreased Total Hours of Sleep:  (Unknown ) Vegetative Symptoms: None  ADLScreening [facility] Assessment Services) Patient's cognitive ability adequate to safely complete daily activities?: Yes Patient able to express need for assistance with ADLs?: Yes Independently performs ADLs?: Yes (appropriate for developmental age)  Prior Inpatient Therapy Prior Inpatient Therapy: Yes Prior Therapy Dates: 2010 Prior Therapy Facilty/Provider(s): [facility] Reason for Treatment: Alcohol   Prior Outpatient Therapy Prior Outpatient Therapy: No Prior Therapy Dates: n/a Does patient have an ACCT team?: No Does patient have Intensive In-House Services?  : No Does patient have [facility] services? : No Does patient have P4CC services?: No  ADL Screening (condition at time of admission) Patient's cognitive ability adequate to safely complete daily activities?: Yes Patient able to express need for assistance with ADLs?: Yes Independently performs ADLs?: Yes (appropriate for developmental age)       Abuse/Neglect Assessment (Assessment to be complete while patient is alone) Physical Abuse: Denies Verbal Abuse: Denies Sexual Abuse: Denies Exploitation of patient/patient's resources: Denies Self-Neglect: Denies Values / Beliefs Cultural Requests During Hospitalization: None Spiritual Requests During Hospitalization: None Consults Spiritual Care Consult Needed: No Social Work Consult Needed: No Merchant navy officer (For Healthcare) Does patient have an advance directive?: No Would patient like information on creating an advanced directive?: No - patient declined information    Additional Information 1:1 In Past  12  Months?: No CIRT Risk: No Elopement Risk: No Does patient have medical clearance?: Yes     Disposition:  Disposition Initial Assessment Completed for this Encounter: Yes Disposition of Patient: Referred to Patient referred to:  (Psych Consult)  On Site Evaluation by:   Reviewed with Physician:    Asa SaunasShawanna N Seini Lannom 09/06/2015 10:21 AM

## 2015-09-06 NOTE — ED Notes (Signed)
Pt resting in his room. Has spoken with TTS. Cooperative behavior on unit. Denies SI/HI and AVH. Pain 8/10 r/t hepatitis C and hip injury. Mild tremor and sweating noted by RN. Will consult with MD. 15 min checks in progress. Monitored for safety.

## 2015-09-06 NOTE — Consult Note (Signed)
[facility] Consult   Reason for Consult:  This is a consult for this 54 year old man with a history of alcohol abuse who came into the hospital after being picked up by police at a gas station where he made suicidal statements while intoxicated Referring Physician:  Joni Fears Patient Identification: [name] MRN:  [id number] Principal Diagnosis: Substance induced mood disorder [facility]) Diagnosis:   Patient Active Problem List   Diagnosis Date Noted  . Substance induced mood disorder ([place]) [F19.94] 09/06/2015  . Alcohol abuse [F10.10] 09/06/2015  . Hepatitis C [B19.20] 09/06/2015    Total Time spent with patient: 1 hour  Subjective:   Jose [name] is a 54 y.o. male patient admitted with "I've been drinking and last night I said something stupid".  HPI:  Patient was picked up by police at a local service station where he was acting intoxicated. When they talked with him apparently he made suicidal statements. Patient tells me today that he never had any actual intention or thought of hurting himself he wishes feeling very depressed and upset last night. He tells me that a friend of his died yesterday in an automobile accident. Additionally his mother died relatively recently. Patient continues to drink heavily on a daily basis. He has severe stress and poor functioning. He lives in a tent. Doesn't work. He says he also used some cocaine yesterday. Uses cocaine infrequently because he can't always afford it but is drinking every day. He can't even estimate the amount that he drinks but says it's been more than even his normal amount. Currently he says that his mood is down but mostly feeling tired. Denies suicidal thoughts or intent. Denies homicidal ideation. Denies any current hallucinations. He admits that he's been eating poorly losing weight doesn't stay well hydrated. Doesn't take care of his health.  Social history: Patient states that he has been living in a tent. He  says that when he gets cold enough he will try to see if his brother will let him stay with him but he doesn't do that his long as he can manage to live outdoors. Not working. Doesn't appear to get in any kind of income not able to work recently.  Family history: Patient denies that there is any family history of mental illness or substance abuse  Medical history: He is hepatitis C positive. Has not gotten any specific treatment for it.  Substance abuse history: Long-standing alcohol abuse problem. He's had detoxes and hospitalizations before but says that he's never managed to stay sober for more than a few days except when he was locked up. The last time he had any extended sobriety was when he was in prison in 1996.  Past Psychiatric History: Patient denies any past history of suicide attempts. He's been treated with antidepressants occasionally in the past but doesn't stay compliant. Mood problems seem to be largely related to substance abuse although it's hard to differentiate given that he doesn't ever stay sober long enough. No history of psychotic disorder  Risk to Self: Suicidal Ideation: No-Not Currently/Within Last 6 Months Suicidal Intent: No Is patient at risk for suicide?: No Suicidal Plan?: No Access to Means: No What has been your use of drugs/alcohol within the last 12 months?: Alcohol  How many times?: 0 Other Self Harm Risks: None Triggers for Past Attempts: Unknown Intentional Self Injurious Behavior: None Risk to Others: Homicidal Ideation: No Thoughts of Harm to Others: No Current Homicidal Intent: No Current Homicidal Plan: No Access  to Homicidal Means: No Identified Victim: None reported History of harm to others?: No Assessment of Violence: None Noted Violent Behavior Description: Denied Does patient have access to weapons?: No Criminal Charges Pending?: No Does patient have a court date: No Prior Inpatient Therapy: Prior Inpatient Therapy: Yes Prior Therapy  Dates: 2010 Prior Therapy Facilty/Provider(s): [facility] Reason for Treatment: Alcohol  Prior Outpatient Therapy: Prior Outpatient Therapy: No Prior Therapy Dates: n/a Does patient have an ACCT team?: No Does patient have Intensive In-House Services?  : No Does patient have [facility] services? : No Does patient have P4CC services?: No  Past Medical History:  Past Medical History  Diagnosis Date  . Hypertension   . Hep C w/o coma, chronic ([place])    History reviewed. No pertinent past surgical history. Family History: No family history on file. Family Psychiatric  History: Patient denies knowing of any family history of mental illness or substance abuse problems Social History:  History  Alcohol Use  . Yes     History  Drug Use  . Yes  . Special: Cocaine    Social History   Social History  . Marital Status: Divorced    Spouse Name: N/A  . Number of Children: N/A  . Years of Education: N/A   Social History Main Topics  . Smoking status: Current Every Day Smoker -- 0.50 packs/day  . Smokeless tobacco: None  . Alcohol Use: Yes  . Drug Use: Yes    Special: Cocaine  . Sexual Activity: Not Asked   Other Topics Concern  . None   Social History Narrative   Additional Social History:    Pain Medications: Denied (Denied) Prescriptions: Denied Over the Counter: Denied History of alcohol / drug use?: Yes Longest period of sobriety (when/how long): Unknown Negative Consequences of Use: Financial, Personal relationships Withdrawal Symptoms: DTs, Tremors, Sweats Name of Substance 1: alcohol 1 - Age of First Use: 15 1 - Amount (size/oz): "As much as I can get" "8 to 10 beers" 1 - Frequency: daily 1 - Duration: 30 years 1 - Last Use / Amount: 09/05/15 Name of Substance 2: Cocaine 2 - Age of First Use: 19 2 - Amount (size/oz): unsure 2 - Frequency: varied 2 - Last Use / Amount: 09/05/2015                 Allergies:  No Known Allergies  Labs:  Results for orders  placed or performed during the hospital encounter of 09/05/15 (from the past 48 hour(s))  Comprehensive metabolic panel     Status: Abnormal   Collection Time: 09/06/15 12:12 AM  Result Value Ref Range   Sodium 137 135 - 145 mmol/L   Potassium 4.0 3.5 - 5.1 mmol/L   Chloride 100 (L) 101 - 111 mmol/L   CO2 30 22 - 32 mmol/L   Glucose, Bld 92 65 - 99 mg/dL   BUN 10 6 - 20 mg/dL   Creatinine, Ser 0.76 0.61 - 1.24 mg/dL   Calcium 9.8 8.9 - 10.3 mg/dL   Total Protein 8.2 (H) 6.5 - 8.1 g/dL   Albumin 4.4 3.5 - 5.0 g/dL   AST 69 (H) 15 - 41 U/L   ALT 48 17 - 63 U/L   Alkaline Phosphatase 105 38 - 126 U/L   Total Bilirubin 0.5 0.3 - 1.2 mg/dL   GFR calc non Af Amer >60 >60 mL/min   GFR calc Af Amer >60 >60 mL/min    Comment: (NOTE) The eGFR has been calculated  using the CKD EPI equation. This calculation has not been validated in all clinical situations. eGFR's persistently <60 mL/min signify possible Chronic Kidney Disease.    Anion gap 7 5 - 15  Ethanol (ETOH)     Status: Abnormal   Collection Time: 09/06/15 12:12 AM  Result Value Ref Range   Alcohol, Ethyl (B) 245 (H) <5 mg/dL    Comment:        LOWEST DETECTABLE LIMIT FOR SERUM ALCOHOL IS 5 mg/dL FOR MEDICAL PURPOSES ONLY   Salicylate level     Status: None   Collection Time: 09/06/15 12:12 AM  Result Value Ref Range   Salicylate Lvl <5.7 2.8 - 30.0 mg/dL  Acetaminophen level     Status: Abnormal   Collection Time: 09/06/15 12:12 AM  Result Value Ref Range   Acetaminophen (Tylenol), Serum <10 (L) 10 - 30 ug/mL    Comment:        THERAPEUTIC CONCENTRATIONS VARY SIGNIFICANTLY. A RANGE OF 10-30 ug/mL MAY BE AN EFFECTIVE CONCENTRATION FOR MANY PATIENTS. HOWEVER, SOME ARE BEST TREATED AT CONCENTRATIONS OUTSIDE THIS RANGE. ACETAMINOPHEN CONCENTRATIONS >150 ug/mL AT 4 HOURS AFTER INGESTION AND >50 ug/mL AT 12 HOURS AFTER INGESTION ARE OFTEN ASSOCIATED WITH TOXIC REACTIONS.   CBC     Status: Abnormal   Collection Time:  09/06/15 12:12 AM  Result Value Ref Range   WBC 7.2 3.8 - 10.6 K/uL   RBC 4.75 4.40 - 5.90 MIL/uL   Hemoglobin 15.5 13.0 - 18.0 g/dL   HCT 46.0 40.0 - 52.0 %   MCV 96.7 80.0 - 100.0 fL   MCH 32.6 26.0 - 34.0 pg   MCHC 33.7 32.0 - 36.0 g/dL   RDW 13.1 11.5 - 14.5 %   Platelets 115 (L) 150 - 440 K/uL  Urine Drug Screen, Qualitative ([facility] only)     Status: Abnormal   Collection Time: 09/06/15 12:12 AM  Result Value Ref Range   Tricyclic, Ur Screen NONE DETECTED NONE DETECTED   Amphetamines, Ur Screen NONE DETECTED NONE DETECTED   MDMA (Ecstasy)Ur Screen NONE DETECTED NONE DETECTED   Cocaine Metabolite,Ur [place] POSITIVE (A) NONE DETECTED   Opiate, Ur Screen NONE DETECTED NONE DETECTED   Phencyclidine (PCP) Ur S NONE DETECTED NONE DETECTED   Cannabinoid 50 Ng, Ur [place] NONE DETECTED NONE DETECTED   Barbiturates, Ur Screen NONE DETECTED NONE DETECTED   Benzodiazepine, Ur Scrn NONE DETECTED NONE DETECTED   Methadone Scn, Ur NONE DETECTED NONE DETECTED    Comment: (NOTE) 017  Tricyclics, urine               Cutoff 1000 ng/mL 200  Amphetamines, urine             Cutoff 1000 ng/mL 300  MDMA (Ecstasy), urine           Cutoff 500 ng/mL 400  Cocaine Metabolite, urine       Cutoff 300 ng/mL 500  Opiate, urine                   Cutoff 300 ng/mL 600  Phencyclidine (PCP), urine      Cutoff 25 ng/mL 700  Cannabinoid, urine              Cutoff 50 ng/mL 800  Barbiturates, urine             Cutoff 200 ng/mL 900  Benzodiazepine, urine           Cutoff 200 ng/mL 1000 Methadone, urine  Cutoff 300 ng/mL 1100 1200 The urine drug screen provides only a preliminary, unconfirmed 1300 analytical test result and should not be used for non-medical 1400 purposes. Clinical consideration and professional judgment should 1500 be applied to any positive drug screen result due to possible 1600 interfering substances. A more specific alternate chemical method 1700 must be used in order to obtain a  confirmed analytical result.  1800 Gas chromato graphy / mass spectrometry (GC/MS) is the preferred 1900 confirmatory method.   Urinalysis complete, with microscopic ([facility] only)     Status: Abnormal   Collection Time: 09/06/15 12:48 AM  Result Value Ref Range   Color, Urine COLORLESS (A) YELLOW   APPearance CLEAR (A) CLEAR   Glucose, UA NEGATIVE NEGATIVE mg/dL   Bilirubin Urine NEGATIVE NEGATIVE   Ketones, ur NEGATIVE NEGATIVE mg/dL   Specific Gravity, Urine 1.002 (L) 1.005 - 1.030   Hgb urine dipstick NEGATIVE NEGATIVE   pH 6.0 5.0 - 8.0   Protein, ur NEGATIVE NEGATIVE mg/dL   Nitrite NEGATIVE NEGATIVE   Leukocytes, UA NEGATIVE NEGATIVE   RBC / HPF NONE SEEN 0 - 5 RBC/hpf   WBC, UA NONE SEEN 0 - 5 WBC/hpf   Bacteria, UA NONE SEEN NONE SEEN   Squamous Epithelial / LPF NONE SEEN NONE SEEN   Mucous PRESENT     Current Facility-Administered Medications  Medication Dose Route Frequency Provider Last Rate Last Dose  . LORazepam (ATIVAN) tablet 0-4 mg  0-4 mg Oral 4 times per day Carrie Mew, MD   0 mg at 09/06/15 1205  . LORazepam (ATIVAN) tablet 0-4 mg  0-4 mg Oral Q12H Carrie Mew, MD   0 mg at 09/06/15 1010  . thiamine (VITAMIN B-1) tablet 100 mg  100 mg Oral Daily Carrie Mew, MD   100 mg at 09/06/15 1017  . traMADol (ULTRAM) tablet 50 mg  50 mg Oral Q6H PRN Gonzella Lex, MD       No current outpatient prescriptions on file.    Musculoskeletal: Strength & Muscle Tone: decreased Gait & Station: unable to stand Patient leans: N/A  Psychiatric Specialty Exam: Review of Systems  Constitutional: Positive for weight loss and malaise/fatigue.  HENT: Negative.   Eyes: Negative.   Respiratory: Negative.   Cardiovascular: Negative.   Gastrointestinal: Negative.   Musculoskeletal: Positive for joint pain.  Skin: Negative.   Neurological: Positive for tremors and weakness.  Psychiatric/Behavioral: Positive for depression and substance abuse. Negative for  suicidal ideas, hallucinations and memory loss. The patient is nervous/anxious and has insomnia.     Blood pressure 152/95, pulse 95, temperature 98.1 F (36.7 C), temperature source Oral, resp. rate 18, height _0  (1.727 m), weight 65.772 kg (145 lb), SpO2 100 %.Body mass index is 22.05 kg/(m^2).  General Appearance: Disheveled  Eye Contact::  Poor  Speech:  Slow and Slurred  Volume:  Decreased  Mood:  Dysphoric  Affect:  Flat  Thought Process:  Goal Directed  Orientation:  Full (Time, Place, and Person)  Thought Content:  Negative  Suicidal Thoughts:  No  Homicidal Thoughts:  No  Memory:  Immediate;   Good Recent;   Fair Remote;   Fair  Judgement:  Impaired  Insight:  Shallow  Psychomotor Activity:  Psychomotor Retardation  Concentration:  Fair  Recall:  [place]  Language: Fair  Akathisia:  No  Handed:  Right  AIMS (if indicated):     Assets:  Communication Skills Resilience  ADL's:  Intact  Cognition: Impaired,  Mild  Sleep:      Treatment Plan Summary: Daily contact with patient to assess and evaluate symptoms and progress in treatment, Medication management and Plan This is a 54 year old man with alcohol abuse and substance-induced mood disorder. The worst of the substance induced mood disorder is largely resolved. He came in last night and had a blood alcohol level in the mid 200s around midnight. Today he denies feeling any suicidal thoughts at all. She remains an ongoing problem. Although he has no history of seizures his blood pressure is staying high he is a little shaky hasn't been able to keep any food down today really. Patient would benefit from having some detox particularly given his precarious social situation. Labs reviewed and he has chronically elevated liver function tests related to his hepatitis and his alcohol abuse. Patient does not meet criteria for psychiatric admission to the hospital. I have talked with [facility] and suggested that we  look into seeing if residential treatment services can be of assistance to this gentleman. He is on C were protocol. Will reassess once he is a little bit more awake.  Disposition: Patient does not meet criteria for psychiatric inpatient admission. Supportive therapy provided about ongoing stressors. Discussed crisis plan, support from social network, calling 911, coming to the Emergency Department, and calling Suicide Hotline.  John Clapacs 09/06/2015 12:16 PM

## 2015-09-06 NOTE — ED Notes (Signed)
CIWA initiated per MD orders.

## 2015-09-06 NOTE — ED Provider Notes (Signed)
  Physical Exam  BP 152/95 mmHg  Pulse 95  Temp(Src) 98.1 F (36.7 C) (Oral)  Resp 18  Ht 5\' 8"  (1.727 m)  Wt 145 lb (65.772 kg)  BMI 22.05 kg/m2  SpO2 100%  Physical Exam  ED Course  Procedures  MDM Dr. Toni Amendlapacs saw patient. Patient came with ETOH abuse. Patient now sober. Referred to [facility] for detox but refused. Will dc home with resources for alcohol abuse.   Richardean Canalavid H Kimoni Pagliarulo, MD 09/06/15 [phone]

## 2015-09-06 NOTE — Discharge Instructions (Signed)
Stop drinking alcohol.   See your doctor.   Contact detox center.   Return to ER if you have thoughts of harming yourself or others, hallucinations.    Emergency Department Resource Guide 1) Find a Doctor and Pay Out of Pocket Although you won't have to find out who is covered by your insurance plan, it is a good idea to ask around and get recommendations. You will then need to call the office and see if the doctor you have chosen will accept you as a new patient and what types of options they offer for patients who are self-pay. Some doctors offer discounts or will set up payment plans for their patients who do not have insurance, but you will need to ask so you aren't surprised when you get to your appointment.  2) Contact Your Local Health Department Not all health departments have doctors that can see patients for sick visits, but many do, so it is worth a call to see if yours does. If you don't know where your local health department is, you can check in your phone book. The CDC also has a tool to help you locate your state's health department, and many state websites also have listings of all of their local health departments.  3) Find a Walk-in Clinic If your illness is not likely to be very severe or complicated, you may want to try a walk in clinic. These are popping up all over the country in pharmacies, drugstores, and shopping centers. They're usually staffed by nurse practitioners or physician assistants that have been trained to treat common illnesses and complaints. They're usually fairly quick and inexpensive. However, if you have serious medical issues or chronic medical problems, these are probably not your best option.  No Primary Care Doctor: - Call Health Connect at  [phone] - they can help you locate a primary care doctor that  accepts your insurance, provides certain services, etc. - Physician Referral Service- [phone]  Chronic Pain Problems: Organization          Address  Phone   Notes  [facility]  [phone] Patients need to be referred by their primary care doctor.   Medication Assistance: Organization         Address  Phone   Notes  [facility] [address] [place], [place] [address] [phone] --Must be a resident of [place] [facility] [place] -- Must have NO insurance coverage whatsoever (no Medicaid/ Medicare, etc.) -- The pt. MUST have a primary care doctor that directs their care regularly and follows them in the community   MedAssist  [phone]   [employer]  [phone]    Agencies that provide inexpensive medical care: Organization         Address  Phone   Notes  [facility]  [phone]   [facility]    [phone]   [facility] [address] [place], [place] [address] [phone]   [facility] [place] [address], [place] [phone]   [facility]anned Parenthood    [phone]   [facility]    [phone]   [facility]  [address], [place] Phone:  [phone], Fax:  [phone] Hours of Operation:  9 am - 6 pm, M-F.  Also accepts Medicaid/Medicare and self-pay.  [facility]  [address]  [place], [place], [place] Phone: [phone], Fax: [phone]. Hours of Operation:  8:30 am - 5:30 pm, M-F.  Also accepts Medicaid and self-pay.  [facility] [place] [address] [place] Phone: [phone]   [place], [place], [place] [phone], Ext. [phone] Mondays & Thursdays: 7-9 AM.  First 15 patients are seen on a first come, first serve basis.    [place] Providers:  Organization         Address  Phone   Notes  [facility] [address], [place] [phone] Also accepts self-pay patients.  [facility] [address], [place]  [phone]   [place] [place], [address], [place] [phone]   [facility] [address] [place] [phone]   Lucianne Lei [address], [place]   [phone] Only accepts [place] Access [place] patients after they have their name applied to their card.   Self-Pay (no insurance) in [facility]:  Organization         Address  Phone   Notes  Sickle Cell Patients, [facility] [place] [phone]   [facility] [place] [phone]   [facility]  [place], [place], [place] [phone]   [facility]/Dr. Osei-Bonsu  [address] [place] or [address], [place] [phone] Phone number for both [place] and [place] locations is the same.  [facility] [address], [place] [phone]   [facility] [address] [place] or [address] [phone]   [facility] [address] [place] [phone], phone; [phone], fax Sees patients 1st and 3rd Saturday of every month.  Must not qualify for public or private insurance (i.e. Medicaid, Medicare, [place] Health Choice, Veterans' Benefits)  Household income should be no more than 200% of the poverty level The clinic cannot treat you if you are pregnant or think you are pregnant  Sexually transmitted diseases are not treated at the clinic.    Dental Care: Organization         Address  Phone  Notes  [facility] [phone] Accepts children up to age 60 who are enrolled in [place] or [place]; pregnant women with a Medicaid card; and  children who have applied for Medicaid or [place] Health Choice, but were declined, whose parents can pay a reduced fee at time of service.  [facility]  [address], [place] [phone] Accepts children up to age 63 who are enrolled in [place] or [place]; pregnant women with a Medicaid card; and children who have applied for Medicaid or  Health Choice, but were declined, whose parents can pay a reduced fee at time of service.  [facility] Access PROGRAM  [place] [phone] Patients are seen by appointment only. Walk-ins are not accepted. Mutual will see patients 43 years of age and older. Monday - Tuesday (8am-5pm) Most Wednesdays (8:30-5pm) $30 per visit, cash only  [place]  [address], [place] [phone] Patients are seen by appointment only. Walk-ins are not accepted. [place] will see patients 31 years of age and older. One Wednesday Evening (Monthly: Volunteer Based).  $30 per visit, cash only  [place]  [phone] for adults; Children under age 53, call Graduate Pediatric Dentistry at [phone]. Children aged 35-14, please call [phone] to request a pediatric application.  Dental services are provided in all areas of dental care including fillings, crowns and bridges, complete and partial dentures, implants, gum treatment, root canals, and extractions. Preventive care is also provided. Treatment is provided to both adults and children. Patients are selected via a lottery and there is often a waiting list.   [facility] [address] [place]  [phone] [email or web address]   [facility] [address] [place], [place] [phone], Ext. [phone] Second and Fourth Thursday of each month, opens at 6:30 AM; Clinic ends at 9 AM.  Patients are seen on a first-come first-served  basis, and a limited number are seen during each clinic.   [facility]  [address] [place] Danker [place], [place] [phone]   Eligibility Requirements You must have lived in [place], [place], or [place] counties for at least the last three months.   You cannot be eligible for state or federal sponsored [employer], including [employer], [place], or [employer].   You generally cannot be eligible for healthcare insurance through your employer.    How to apply: Eligibility screenings are held every Tuesday and Wednesday afternoon from 1:00 pm until 4:00 pm. You do not need an appointment for the interview!  [facility] [address], [place], [place]   [place]  [place] Department  [place]  [phone]    Behavioral Health Resources in the Community: Intensive Outpatient Programs Organization         Address  Phone  Notes  [place] [address], [place], [place] [phone]   [facility] Outpatient [address] [place], [place]   ADS: Alcohol & Drug Svcs [address], [place], [place]   [place] [address],  [place], [place] or [phone]   Substance Abuse Resources Organization         Address  Phone  Notes  Alcohol and Drug Services  [phone]   [place]  [phone]   The [place]   [place]  [phone]   Residential & Outpatient Substance Abuse Program  [phone]   Psychological Services Organization         Address  Phone  Notes  [facility] [place]  [place]  [phone]   [place] [address], [place] or [phone]    Mobile Crisis Teams Organization          Address  Phone  Notes  [facility], Mobile Crisis Care Unit  [phone]   [facility]  [address] [place], [place]   [place] Levels [address], [place] [phone]    Self-Help/Support Groups Organization         Address  Phone             Notes  [place] of [place] -  variety of support groups  [phone] Call for more information  Narcotics Anonymous (NA), [facility]aring [facility] [address], [place]  2 meetings at this location   Residential Facilities manager         Address  Phone  Notes  [facility]idential [facility] [place],    [place]  [phone]   [facility]  [address] [place] [phone], [place], [place]   [place], [place] [phone] Admissions: 8am-3pm M-F  Incentives Substance [place] [address]    [place], [place] [phone]   [facility] [address] [place], [place], [place]   [facility] [address]  [place], [place]   [facility] Programs - Intensive Outpatient [address], [place], [place]   [facility].,  [place], [place] [phone] or [phone]   [facility] Services (RTS) [address], [place], [place] Accepts Medicaid  [facility] [place] [address],  [place] [phone] Substance Abuse/Addiction Treatment   [facility] Organization         Address  Phone  Notes  [facility]  [phone]   Domenic Schwab, PhD [address] [place], [place]   [phone] or [phone]   [place], [place] [phone]   [facility] [address] [place], [place] [phone] Insurance/Medicaid/sponsorship  through [facility] and Families [address] [place]                                    [place], [place] [phone] [place] [address] [place], [place] [phone]    Dr. Adele Schilder  [phone]   [facility] Dept. 1) [address]  2) [place] 3)  [place] [address], [place] [phone]  [phone]   [place] [phone] or [phone] (After Hours)

## 2015-09-06 NOTE — ED Notes (Addendum)
Patient asleep in room. No noted distress or abnormal behavior. Will continue 15 minute checks and observation by security cameras for safety. 

## 2015-09-07 NOTE — ED Notes (Signed)
Pt. Noted in room resting quietly;. No complaints or concerns voiced. No distress or abnormal behavior noted. Will continue to monitor with security cameras. Q 15 minute rounds continue. 

## 2015-09-07 NOTE — ED Notes (Signed)
Patient discharged ambulatory to self. Denies SI or HI. Discharge instructions reviewed with patient, verbalizes understanding. Patient received copy of DC plan and all personal belongings. 

## 2015-09-07 NOTE — ED Provider Notes (Addendum)
Patient remains medically stable for psychiatric placement or release.  Patient has been cleared by Dr. Toni Amendlapacs for discharge.  Emily FilbertJonathan E Izac Faulkenberry, MD 09/07/15 [id number]  Emily FilbertJonathan E Uriel Horkey, MD 09/07/15 1005

## 2015-09-07 NOTE — ED Notes (Signed)
Patient asleep in room. No noted distress or abnormal behavior. Will continue 15 minute checks and observation by security cameras for safety. 

## 2015-09-07 NOTE — ED Notes (Signed)
Patient resting quietly in room. No noted distress or abnormal behaviors noted. Will continue 15 minute checks and observation by security camera for safety. 

## 2015-09-07 NOTE — ED Notes (Signed)

## 2015-09-21 ENCOUNTER — Encounter: Payer: Self-pay | Admitting: Emergency Medicine

## 2015-09-21 ENCOUNTER — Emergency Department: Payer: Self-pay

## 2015-09-21 ENCOUNTER — Emergency Department
Admission: EM | Admit: 2015-09-21 | Discharge: 2015-09-22 | Disposition: A | Discharge status: Other Acute Inpatient Hospital | Payer: Self-pay | Attending: Emergency Medicine | Admitting: Emergency Medicine

## 2015-09-21 ENCOUNTER — Other Ambulatory Visit: Payer: Self-pay

## 2015-09-21 DIAGNOSIS — F1910 Other psychoactive substance abuse, uncomplicated: Secondary | ICD-10-CM

## 2015-09-21 DIAGNOSIS — F1994 Other psychoactive substance use, unspecified with psychoactive substance-induced mood disorder: Secondary | ICD-10-CM

## 2015-09-21 DIAGNOSIS — F1010 Alcohol abuse, uncomplicated: Secondary | ICD-10-CM

## 2015-09-21 DIAGNOSIS — R45851 Suicidal ideations: Secondary | ICD-10-CM | POA: Insufficient documentation

## 2015-09-21 DIAGNOSIS — F17200 Nicotine dependence, unspecified, uncomplicated: Secondary | ICD-10-CM | POA: Insufficient documentation

## 2015-09-21 DIAGNOSIS — G8929 Other chronic pain: Secondary | ICD-10-CM | POA: Insufficient documentation

## 2015-09-21 DIAGNOSIS — B1920 Unspecified viral hepatitis C without hepatic coma: Secondary | ICD-10-CM | POA: Diagnosis present

## 2015-09-21 DIAGNOSIS — F329 Major depressive disorder, single episode, unspecified: Secondary | ICD-10-CM | POA: Insufficient documentation

## 2015-09-21 DIAGNOSIS — R0789 Other chest pain: Secondary | ICD-10-CM | POA: Insufficient documentation

## 2015-09-21 DIAGNOSIS — I10 Essential (primary) hypertension: Secondary | ICD-10-CM | POA: Insufficient documentation

## 2015-09-21 DIAGNOSIS — F1414 Cocaine abuse with cocaine-induced mood disorder: Secondary | ICD-10-CM | POA: Insufficient documentation

## 2015-09-21 LAB — ACETAMINOPHEN LEVEL

## 2015-09-21 LAB — URINE DRUG SCREEN, QUALITATIVE (ARMC ONLY)
Amphetamines, Ur Screen: NOT DETECTED
Barbiturates, Ur Screen: NOT DETECTED
Benzodiazepine, Ur Scrn: NOT DETECTED
Cannabinoid 50 Ng, Ur ~~LOC~~: NOT DETECTED
Cocaine Metabolite,Ur ~~LOC~~: POSITIVE — AB
MDMA (Ecstasy)Ur Screen: NOT DETECTED
Methadone Scn, Ur: NOT DETECTED
Opiate, Ur Screen: NOT DETECTED
Phencyclidine (PCP) Ur S: NOT DETECTED
Tricyclic, Ur Screen: NOT DETECTED

## 2015-09-21 LAB — COMPREHENSIVE METABOLIC PANEL WITH GFR
ALT: 88 U/L — ABNORMAL HIGH (ref 17–63)
AST: 167 U/L — ABNORMAL HIGH (ref 15–41)
Albumin: 4.1 g/dL (ref 3.5–5.0)
Alkaline Phosphatase: 92 U/L (ref 38–126)
Anion gap: 10 (ref 5–15)
BUN: 11 mg/dL (ref 6–20)
CO2: 24 mmol/L (ref 22–32)
Calcium: 9.1 mg/dL (ref 8.9–10.3)
Chloride: 98 mmol/L — ABNORMAL LOW (ref 101–111)
Creatinine, Ser: 0.88 mg/dL (ref 0.61–1.24)
GFR calc Af Amer: >60 mL/min
GFR calc non Af Amer: >60 mL/min
Glucose, Bld: 323 mg/dL — ABNORMAL HIGH (ref 65–99)
Potassium: 3.7 mmol/L (ref 3.5–5.1)
Sodium: 132 mmol/L — ABNORMAL LOW (ref 135–145)
Total Bilirubin: 1.2 mg/dL (ref 0.3–1.2)
Total Protein: 7.9 g/dL (ref 6.5–8.1)

## 2015-09-21 LAB — CBC
HEMATOCRIT: 44.1 % (ref 40.0–52.0)
HEMOGLOBIN: 14.9 g/dL (ref 13.0–18.0)
MCH: 32.5 pg (ref 26.0–34.0)
MCHC: 33.7 g/dL (ref 32.0–36.0)
MCV: 96.4 fL (ref 80.0–100.0)
Platelets: 102 10*3/uL — ABNORMAL LOW (ref 150–440)
RBC: 4.58 MIL/uL (ref 4.40–5.90)
RDW: 13.1 % (ref 11.5–14.5)
WBC: 7.6 10*3/uL (ref 3.8–10.6)

## 2015-09-21 LAB — SALICYLATE LEVEL: Salicylate Lvl: <4 mg/dL (ref 2.8–30.0)

## 2015-09-21 LAB — TROPONIN I: Troponin I: <0.03 ng/mL

## 2015-09-21 LAB — ETHANOL: Alcohol, Ethyl (B): <5 mg/dL (ref <5)

## 2015-09-21 MED ORDER — LORAZEPAM 2 MG PO TABS: 0.0000 mg | ORAL_TABLET | Freq: Two times a day (BID) | ORAL | Status: DC

## 2015-09-21 MED ORDER — LORAZEPAM 2 MG PO TABS
0.0000 mg | ORAL_TABLET | Freq: Four times a day (QID) | ORAL | Status: DC
  Administered 2015-09-21: 2 mg via ORAL
  Filled 2015-09-21: qty 1

## 2015-09-21 MED ORDER — LORAZEPAM 2 MG/ML IJ SOLN
0.0000 mg | Freq: Two times a day (BID) | INTRAMUSCULAR | Status: DC
Start: 2015-09-21 — End: 2015-09-22

## 2015-09-21 MED ORDER — VITAMIN B-1 100 MG PO TABS: 100.0000 mg | ORAL_TABLET | Freq: Every day | ORAL | Status: DC

## 2015-09-21 MED ORDER — THIAMINE HCL 100 MG/ML IJ SOLN: 100.0000 mg | Freq: Every day | INTRAMUSCULAR | Status: DC

## 2015-09-21 MED ORDER — LORAZEPAM 2 MG/ML IJ SOLN: 0.0000 mg | Freq: Four times a day (QID) | INTRAMUSCULAR | Status: DC

## 2015-09-21 NOTE — ED Notes (Signed)
Patient to ED-BHU from ED ambulatory without difficulty to room #3.  Patient is alert and oriented, calm and cooperative and in no acute distress at the present time.  Patient denies any pain currently.  Patient is oriented to the unit.  Patient denies suicidal ideation, homicidal ideation, auditory or visual hallucinations.  Patient is made aware of security cameras and q.15 minute safety checks.  Patient is encouraged to notify staff with any questions or concerns.

## 2015-09-21 NOTE — BH Assessment (Signed)
Assessment Note  Jose [name] is an 54 y.o. male presenting to the ED for a psychiatric consult and detox from alcohol. Pt was transported to the ED via EMS w/ reports of SI. Per EMS, pt was picked up at service station. Pt reports thoughts of hurting himself but with no actual thoughts of harming himself. Pt. denies any suicidal plan or intent. Pt. denies the presence of any auditory or visual hallucinations at this time. Pt states that he is currently homeless.  He was previously referred to [facility] but patient reports declining the help offered.   Diagnosis: Detox/Suicidal  Past Medical History:  Past Medical History  Diagnosis Date  . Hypertension   . Hep C w/o coma, chronic (HCC)     History reviewed. No pertinent past surgical history.  Family History: No family history on file.  Social History:  reports that he has been smoking.  He does not have any smokeless tobacco history on file. He reports that he drinks alcohol. He reports that he uses illicit drugs (Cocaine).  Additional Social History:  Alcohol / Drug Use History of alcohol / drug use?: Yes Substance #1 Name of Substance 1: ETOH 1 - Age of First Use: 15 1 - Amount (size/oz): 8 to 10 beers 1 - Frequency: daily 1 - Duration: 30 years 1 - Last Use / Amount: 09/21/2015 Substance #2 Name of Substance 2: cocaine 2 - Age of First Use: 19 2 - Amount (size/oz): unsure 2 - Frequency: varied 2 - Last Use / Amount: 09/21/2015  CIWA: CIWA-Ar BP: (!) 160/62 mmHg Pulse Rate: 95 Nausea and Vomiting: 3 Tactile Disturbances: none Tremor: three Auditory Disturbances: not present Paroxysmal Sweats: two Visual Disturbances: not present Anxiety: mildly anxious Headache, Fullness in Head: moderate Agitation: normal activity Orientation and Clouding of Sensorium: oriented and can do serial additions CIWA-Ar Total: 12 COWS:    Allergies: No Known Allergies  Home Medications:  (Not in a hospital admission)  OB/GYN  Status:  No LMP for male patient.  General Assessment Data [facility] Assessment[facility] ED TTS Assessment: In system Is this a Tele or Face-to-Face Assessment?: Face-to-Face Is this an Initial Assessment or a Re-assessment for this encounter?: Initial Assessment Marital status: Divorced [place] name: N/A Is patient pregnant?: No Pregnancy Status: No Living Arrangements: Other (Comment) (homeless) Can pt return to current living arrangement?: Yes Admission Status: Voluntary Is patient capable of signing voluntary admission?: Yes Referral Source: Self/Family/Friend Insurance type: None  Medical Screening Exam [facility] Walk-in ONLY) Medical Exam completed: Yes  Crisis Care Plan Living Arrangements: Other (Comment) (homeless) Legal Guardian: Other: (self) Name of Psychiatrist: None Name of Therapist: None  Education Status Is patient currently in school?: No Current Grade: N/A Highest grade of school patient has completed: 10th Name of school: [employer] person: n/a  Risk to self with the past 6 months Suicidal Ideation: No-Not Currently/Within Last 6 Months Has patient been a risk to self within the past 6 months prior to admission? : No Suicidal Intent: No Has patient had any suicidal intent within the past 6 months prior to admission? : No Is patient at risk for suicide?: No Suicidal Plan?: No Has patient had any suicidal plan within the past 6 months prior to admission? : No Access to Means: No What has been your use of drugs/alcohol within the last 12 months?: ETOh and cocaine Previous Attempts/Gestures: No How many times?: 0 Other Self Harm Risks: none reported Triggers for Past Attempts: Unknown Intentional Self  Injurious Behavior: None Family Suicide History: Unknown Recent stressful life event(s): Job Loss, Other (Comment) (homeless) Persecutory voices/beliefs?: No Depression: Yes Depression Symptoms: Feeling worthless/self pity, Loss of interest in  usual pleasures, Despondent Substance abuse history and/or treatment for substance abuse?: Yes Suicide prevention information given to non-admitted patients: Yes  Risk to Others within the past 6 months Homicidal Ideation: No Does patient have any lifetime risk of violence toward others beyond the six months prior to admission? : No Thoughts of Harm to Others: No Current Homicidal Intent: No Current Homicidal Plan: No Access to Homicidal Means: No Identified Victim: None identified History of harm to others?: No Assessment of Violence: None Noted Violent Behavior Description: Patient denies Does patient have access to weapons?: No Criminal Charges Pending?: No Does patient have a court date: No Is patient on probation?: No  Psychosis Hallucinations: None noted Delusions: None noted  Mental Status Report Appearance/Hygiene: In scrubs Eye Contact: Fair Motor Activity: Unremarkable Speech: Logical/coherent Level of Consciousness: Alert Mood: Depressed Affect: Blunted, Flat Anxiety Level: Minimal Thought Processes: Coherent Judgement: Partial Orientation: Person, Place, Time, Situation Obsessive Compulsive Thoughts/Behaviors: None  Cognitive Functioning Concentration: Normal Memory: Recent Intact IQ: Average Insight: Fair Impulse Control: Fair Appetite: Poor Weight Loss: 0 Weight Gain: 0 Sleep: Decreased Total Hours of Sleep: 0 Vegetative Symptoms: None  ADLScreening ([facility] Assessment Services) Patient's cognitiveGrant-Blackford Mental Health[facility] Inc ability adequate to safely complete daily activities?: Yes Patient able to express need for assistance with ADLs?: Yes Independently performs ADLs?: Yes (appropriate for developmental age)  Prior Inpatient Therapy Prior Inpatient Therapy: Yes Prior Therapy Dates: 08/2015 [facility] Reason for Treatment: Alcohol   Prior Outpatient Therapy Prior Outpatient Therapy: No Prior Therapy Dates: n/a Prior Therapy  Facilty/Provider(s): N/A Reason for Treatment: N/A Does patient have an ACCT team?: No Does patient have Intensive In-House Services?  : No Does patient have Monarch services? : No Does patient have P4CC services?: No  ADL Screening (condition at time of admission) Patient's cognitive ability adequate to safely complete daily activities?: Yes Patient able to express need for assistance with ADLs?: Yes Independently performs ADLs?: Yes (appropriate for developmental age)       Abuse/Neglect Assessment (Assessment to be complete while patient is alone) Physical Abuse: Denies Verbal Abuse: Denies Sexual Abuse: Denies Exploitation of patient/patient's resources: Denies Self-Neglect: Denies Values / Beliefs Cultural Requests During Hospitalization: None Spiritual Requests During Hospitalization: None Consults Spiritual Care Consult Needed: No Social Work Consult Needed: No      Additional Information 1:1 In Past 12 Months?: No CIRT Risk: No Elopement Risk: No Does patient have medical clearance?: Yes     Disposition:  Disposition Initial Assessment Completed for this Encounter: Yes Disposition of Patient: Other dispositions (Psych MD) Other disposition(s): Other (Comment) (Psych MD) Patient referred to: Other (Comment) (Psych MD)  On Site Evaluation by:   Reviewed with Physician:    Artist Beachoxana C Neriah Brott 09/21/2015 10:21 PM

## 2015-09-21 NOTE — ED Notes (Signed)
BEHAVIORAL HEALTH ROUNDING Patient sleeping: No. Patient alert and oriented: yes Behavior appropriate: Yes.  ; If no, describe:  Nutrition and fluids offered: Yes  Toileting and hygiene offered: Yes  Sitter present: yes Law enforcement present: Yes  

## 2015-09-21 NOTE — ED Notes (Signed)
Behav. Med at the bedside to evaluate pt.

## 2015-09-21 NOTE — ED Notes (Signed)
Patient transported to X-ray 

## 2015-09-21 NOTE — ED Provider Notes (Signed)
[facility] Emergency Department Provider Note  ____________________________________________  Time seen: Approximately 9:26 PM  I have reviewed the triage vital signs and the nursing notes.   HISTORY  Chief Complaint Chest Pain and Suicidal    HPI Jose [name] is a 54 y.o. male with a history of hep C, hypertension, cocaine abuse, alcohol abuse, homelessness, depression.  He states that he recently had one of his best friends die and has been increasingly depressed.  He feels like he has nothing to live for and wants to die.  He has no specific plan.  He had some vomiting and nausea earlier today but that has improved.  He has not had anything to drink since yesterday and also used cocaine yesterday.  He describes his symptoms as severe and nothing makes it better nothing makes it worse and has been gradual in onset.  He is denying any chest pain to me although he stated that he had chest pain to the triage nurse.   Past Medical History  Diagnosis Date  . Hypertension   . Hep C w/o coma, chronic [facility](HCC)     Patient Active Problem List   Diagnosis Date Noted  . Substance induced mood disorder (HCC) 09/06/2015  . Alcohol abuse 09/06/2015  . Hepatitis C 09/06/2015    History reviewed. No pertinent past surgical history.  No current outpatient prescriptions on file.  Allergies Review of patient's allergies indicates no known allergies.  No family history on file.  Social History Social History  Substance Use Topics  . Smoking status: Current Every Day Smoker -- 0.50 packs/day  . Smokeless tobacco: None  . Alcohol Use: Yes    Review of Systems Constitutional: No fever/chills Eyes: No visual changes. ENT: No sore throat. Cardiovascular: Chronic chest pain for 1 month Respiratory: Denies shortness of breath. Gastrointestinal: No abdominal pain.  Brief nausea and vomiting, now resolved.  No diarrhea.  No constipation. Genitourinary: Negative  for dysuria. Musculoskeletal: Negative for back pain. Skin: Negative for rash. Neurological: Negative for headaches, focal weakness or numbness. Psychiatric:Depression, +SI 10-point ROS otherwise negative.  ____________________________________________   PHYSICAL EXAM:  VITAL SIGNS: ED Triage Vitals  Enc Vitals Group     BP 09/21/15 1841 182/96 mmHg     Pulse Rate 09/21/15 1841 85     Resp 09/21/15 1841 18     Temp 09/21/15 1841 97.3 F (36.3 C)     Temp Source 09/21/15 1841 Oral     SpO2 09/21/15 1841 99 %     Weight --      Height --      Head Cir --      Peak Flow --      Pain Score --      Pain Loc --      Pain Edu? --      Excl. in GC? --     Constitutional: Alert and oriented. Well appearing and in no acute distress. Eyes: Conjunctivae are normal. PERRL. EOMI. Head: Atraumatic. Nose: No congestion/rhinnorhea. Mouth/Throat: Mucous membranes are moist.  Oropharynx non-erythematous. Neck: No stridor.   Cardiovascular: Normal rate, regular rhythm. Grossly normal heart sounds.  Good peripheral circulation.  Chest wall TTP. Respiratory: Normal respiratory effort.  No retractions. Lungs CTAB. Gastrointestinal: Soft and nontender. No distention. No abdominal bruits. No CVA tenderness. Musculoskeletal: No lower extremity tenderness nor edema.  No joint effusions. Neurologic:  Normal speech and language. No gross focal neurologic deficits are appreciated.  Skin:  Skin is warm,  dry and intact. No rash noted. Psychiatric: Mood and affect are depressed. Speech and behavior are normal. +SI, +depression  ____________________________________________   LABS (all labs ordered are listed, but only abnormal results are displayed)  Labs Reviewed  COMPREHENSIVE METABOLIC PANEL - Abnormal; Notable for the following:    Sodium 132 (*)    Chloride 98 (*)    Glucose, Bld 323 (*)    AST 167 (*)    ALT 88 (*)    All other components within normal limits  ACETAMINOPHEN LEVEL -  Abnormal; Notable for the following:    Acetaminophen (Tylenol), Serum <10 (*)    All other components within normal limits  CBC - Abnormal; Notable for the following:    Platelets 102 (*)    All other components within normal limits  URINE DRUG SCREEN, QUALITATIVE ([facility] ONLY) - Abnormal; Notable for the following:    Cocaine Metabolite,Ur [place] POSITIVE (*)    All other components within normal limits  ETHANOL  SALICYLATE LEVEL  TROPONIN I   ____________________________________________  EKG  ED ECG REPORT I, Artemisa Sladek, the attending physician, personally viewed and interpreted this ECG.   Date: 09/21/2015  EKG Time: 18:50  Rate: 88  Rhythm: normal sinus rhythm  Axis: Normal  Intervals:right bundle branch block  ST&T Change: Non-specific ST segment / T-wave changes, but no evidence of acute ischemia.  Heavy artifact is present, we will repeat  ED ECG REPORT #2 I, Yiannis Tulloch, the attending physician, personally viewed and interpreted this ECG.   Date: 09/21/2015  EKG Time: 22:10  Rate: 78  Rhythm: normal sinus rhythm  Axis: Normal  Intervals:right bundle branch block  ST&T Change: Non-specific ST segment / T-wave changes, but no evidence of acute ischemia.    ____________________________________________  RADIOLOGY   Dg Chest 2 View  09/21/2015  CLINICAL DATA:  Chest pain EXAM: CHEST  2 VIEW COMPARISON:  09/06/2015 chest radiograph. FINDINGS: Stable cardiomediastinal silhouette with normal heart size. No pneumothorax. No pleural effusion. Clear lungs, with no focal lung consolidation and no pulmonary edema. IMPRESSION: No active cardiopulmonary disease. Electronically Signed   By: Delbert Phenix M.D.   On: 09/21/2015 19:15    ____________________________________________   PROCEDURES  Procedure(s) performed: None  Critical Care performed: No ____________________________________________   INITIAL IMPRESSION / ASSESSMENT AND PLAN / ED COURSE  Pertinent  labs & imaging results that were available during my care of the patient were reviewed by me and considered in my medical decision making (see chart for details).  No acute medical issue at this time.  High risk given depression, drug abuse, poor social situation.  Involuntary commitment, pending psychiatry evaluation.  CIWA protocol initiated.  ____________________________________________  FINAL CLINICAL IMPRESSION(S) / ED DIAGNOSES  Final diagnoses:  Polysubstance abuse  Chronic chest wall pain  Substance induced mood disorder (HCC)  Alcohol abuse  Hepatitis C virus infection, unspecified chronicity  Suicidal ideation      NEW MEDICATIONS STARTED DURING THIS VISIT:  New Prescriptions   No medications on file     Loleta Rose, MD 09/21/15 [phone]

## 2015-09-21 NOTE — ED Notes (Signed)
Report received from Andrea RN.

## 2015-09-21 NOTE — ED Notes (Signed)
ED BHU PLACEMENT JUSTIFICATION Is the patient under IVC or is there intent for IVC: Yes.   Is the patient medically cleared: Yes.   Is there vacancy in the ED BHU: Yes.   Is the population mix appropriate for patient: Yes.   Is the patient awaiting placement in inpatient or outpatient setting: Yes.   Has the patient had a psychiatric consult: Yes.   Survey of unit performed for contraband, proper placement and condition of furniture, tampering with fixtures in bathroom, shower, and each patient room: Yes.  ; Findings: All clear. APPEARANCE/BEHAVIOR calm, cooperative and adequate rapport can be established NEURO ASSESSMENT Orientation: time, place and person Hallucinations: No.None noted (Hallucinations) Speech: Normal Gait: normal RESPIRATORY ASSESSMENT Within normal limits. CARDIOVASCULAR ASSESSMENT Within normal limits GASTROINTESTINAL ASSESSMENT Within normal limits EXTREMITIES ROM of all joints is normal PLAN OF CARE Provide calm/safe environment. Vital signs assessed twice daily. ED BHU Assessment once each 12-hour shift. Collaborate with intake RN daily or as condition indicates. Assure the ED provider has rounded once each shift. Provide and encourage hygiene. Provide redirection as needed. Assess for escalating behavior; address immediately and inform ED provider.  Assess family dynamic and appropriateness for visitation as needed: Yes.  ; If necessary, describe findings:  Educate the patient/family about BHU procedures/visitation: Yes.  ; If necessary, describe findings: Patient calm and cooperative at this time, understanding of BHU rules and procedures.  Will continue to monitor.  

## 2015-09-21 NOTE — ED Notes (Signed)
Pt comes into the ED via EMS c/o chest pain.  Patient explains that he has had the chest pain intermittently for a month.  Tender to palpation.  190/101, 102 HR, 98 temp, 99% room air.  Patient is currently homeless.  H/o unmanaged HTN, hep C, cocaine substance abuse and ETOH use. Patient had one episode of nausea and vomiting earlier today. Patient states he feels helpless and was feeling suicidal yesterday.  Patient denies current SI or HI.

## 2015-09-21 NOTE — ED Notes (Signed)
Pt up to restroom with steady gait. No distress noted. [facility]

## 2015-09-22 DIAGNOSIS — F1994 Other psychoactive substance use, unspecified with psychoactive substance-induced mood disorder: Secondary | ICD-10-CM

## 2015-09-22 NOTE — Progress Notes (Signed)
Pt has been referred to [facility] for follow-up and aftercare services.   09/22/2015 Nicole Pearline Yerby, MS, NCC, LPCA Therapeutic Triage Specialist  

## 2015-09-22 NOTE — ED Notes (Signed)
Pt irritable. Resting in room. Pt is not pleased about being discharged today. Maintained on 15 minute checks and observation by security camera for safety.

## 2015-09-22 NOTE — ED Provider Notes (Signed)
-----------------------------------------   3:23 PM on 09/22/2015 -----------------------------------------  Case discussed with Dr. Toni Amendlapacs of psychiatry in the emergency department. No acute psychiatric complaints at this time. Medically and psychiatrically stable with normal vital signs. We'll follow-up with [facility] regarding his alcohol abuse. Suitable for outpatient follow-up. No threat to self or others. IVC rescinded by Dr. Toni Amendlapacs.  Sharman CheekPhillip Kitt Minardi, MD 09/22/15 [phone]

## 2015-09-22 NOTE — ED Notes (Signed)
Patient asleep in room. No noted distress or abnormal behavior. Will continue 15 minute checks and observation by security cameras for safety. 

## 2015-09-22 NOTE — ED Notes (Signed)
Psychiatrist at bedside. Maintained on 15 minute checks and observation by security camera for safety. 

## 2015-09-22 NOTE — ED Notes (Signed)
Belongings sent with patient. Patient denies SI/HI and AVH. 

## 2015-09-22 NOTE — ED Notes (Signed)
Pt irritable. Passive SI. Denies HI/AVH. Complains of pain, would not rate it on 1-10 scale for writer. States he is depressed, no where safe to live. "I want to be left alone to sleep."  Maintained on 15 minute checks and observation by security camera for safety.

## 2015-09-22 NOTE — ED Provider Notes (Signed)
-----------------------------------------   8:31 AM on 09/22/2015 -----------------------------------------   Blood pressure 160/62, pulse 95, temperature 98.4 F (36.9 C), temperature source Oral, resp. rate 18, SpO2 99 %.  The patient had no acute events since last update.  Calm and cooperative at this time.  Patient to be seen by psych     Rebecka ApleyAllison P Webster, MD 09/22/15 [phone]

## 2015-09-22 NOTE — ED Notes (Signed)
Patient ready for discharge. 

## 2015-09-22 NOTE — ED Notes (Signed)
Patient resting quietly in room. No noted distress or abnormal behaviors noted. Will continue 15 minute checks and observation by security camera for safety. 

## 2015-09-22 NOTE — Discharge Instructions (Signed)
Alcohol Use Disorder °Alcohol use disorder is a mental disorder. It is not a one-time incident of heavy drinking. Alcohol use disorder is the excessive and uncontrollable use of alcohol over time that leads to problems with functioning in one or more areas of daily living. People with this disorder risk harming themselves and others when they drink to excess. Alcohol use disorder also can cause other mental disorders, such as mood and anxiety disorders, and serious physical problems. People with alcohol use disorder often misuse other drugs.  °Alcohol use disorder is common and widespread. Some people with this disorder drink alcohol to cope with or escape from negative life events. Others drink to relieve chronic pain or symptoms of mental illness. People with a family history of alcohol use disorder are at higher risk of losing control and using alcohol to excess.  °Drinking too much alcohol can cause injury, accidents, and health problems. One drink can be too much when you are: °· Working. °· Pregnant or breastfeeding. °· Taking medicines. Ask your doctor. °· Driving or planning to drive. °SYMPTOMS  °Signs and symptoms of alcohol use disorder may include the following:  °· Consumption of alcohol in larger amounts or over a longer period of time than intended. °· Multiple unsuccessful attempts to cut down or control alcohol use.   °· A great deal of time spent obtaining alcohol, using alcohol, or recovering from the effects of alcohol (hangover). °· A strong desire or urge to use alcohol (cravings).   °· Continued use of alcohol despite problems at work, school, or home because of alcohol use.   °· Continued use of alcohol despite problems in relationships because of alcohol use. °· Continued use of alcohol in situations when it is physically hazardous, such as driving a car. °· Continued use of alcohol despite awareness of a physical or psychological problem that is likely related to alcohol use. Physical  problems related to alcohol use can involve the brain, heart, liver, stomach, and intestines. Psychological problems related to alcohol use include intoxication, depression, anxiety, psychosis, delirium, and dementia.   °· The need for increased amounts of alcohol to achieve the same desired effect, or a decreased effect from the consumption of the same amount of alcohol (tolerance). °· Withdrawal symptoms upon reducing or stopping alcohol use, or alcohol use to reduce or avoid withdrawal symptoms. Withdrawal symptoms include: °¨ Racing heart. °¨ Hand tremor. °¨ Difficulty sleeping. °¨ Nausea. °¨ Vomiting. °¨ Hallucinations. °¨ Restlessness. °¨ Seizures. °DIAGNOSIS °Alcohol use disorder is diagnosed through an assessment by your health care provider. Your health care provider may start by asking three or four questions to screen for excessive or problematic alcohol use. To confirm a diagnosis of alcohol use disorder, at least two symptoms must be present within a 12-month period. The severity of alcohol use disorder depends on the number of symptoms: °· Mild--two or three. °· Moderate--four or five. °· Severe--six or more. °Your health care provider may perform a physical exam or use results from lab tests to see if you have physical problems resulting from alcohol use. Your health care provider may refer you to a mental health professional for evaluation. °TREATMENT  °Some people with alcohol use disorder are able to reduce their alcohol use to low-risk levels. Some people with alcohol use disorder need to quit drinking alcohol. When necessary, mental health professionals with specialized training in substance use treatment can help. Your health care provider can help you decide how severe your alcohol use disorder is and what type of treatment you need.   The following forms of treatment are available:   Detoxification. Detoxification involves the use of prescription medicines to prevent alcohol withdrawal  symptoms in the first week after quitting. This is important for people with a history of symptoms of withdrawal and for heavy drinkers who are likely to have withdrawal symptoms. Alcohol withdrawal can be dangerous and, in severe cases, cause death. Detoxification is usually provided in a hospital or in-patient substance use treatment facility.  Counseling or talk therapy. Talk therapy is provided by substance use treatment counselors. It addresses the reasons people use alcohol and ways to keep them from drinking again. The goals of talk therapy are to help people with alcohol use disorder find healthy activities and ways to cope with life stress, to identify and avoid triggers for alcohol use, and to handle cravings, which can cause relapse.  Medicines.Different medicines can help treat alcohol use disorder through the following actions:  Decrease alcohol cravings.  Decrease the positive reward response felt from alcohol use.  Produce an uncomfortable physical reaction when alcohol is used (aversion therapy).  Support groups. Support groups are run by people who have quit drinking. They provide emotional support, advice, and guidance. These forms of treatment are often combined. Some people with alcohol use disorder benefit from intensive combination treatment provided by specialized substance use treatment centers. Both inpatient and outpatient treatment programs are available.   This information is not intended to replace advice given to you by your health care provider. Make sure you discuss any questions you have with your health care provider.   Document Released: 10/18/2004 Document Revised: 10/01/2014 Document Reviewed: 12/18/2012 Elsevier Interactive Patient Education 2016 [employer].  Alcohol Abuse and Nutrition Alcohol abuse is any pattern of alcohol consumption that harms your health, relationships, or work. Alcohol abuse can affect how your body breaks down and absorbs  nutrients from food by causing your liver to work abnormally. Additionally, many people who abuse alcohol do not eat enough carbohydrates, protein, fat, vitamins, and minerals. This can cause poor nutrition (malnutrition) and a lack of nutrients (nutrient deficiencies), which can lead to further complications. Nutrients that are commonly lacking (deficient) among people who abuse alcohol include:  Vitamins.  Vitamin A. This is stored in your liver. It is important for your vision, metabolism, and ability to fight off infections (immunity).  B vitamins. These include vitamins such as folate, thiamin, and niacin. These are important in new cell growth and maintenance.  Vitamin C. This plays an important role in iron absorption, wound healing, and immunity.  Vitamin D. This is produced by your liver, but you can also get vitamin D from food. Vitamin D is necessary for your body to absorb and use calcium.  Minerals.  Calcium. This is important for your bones and your heart and blood vessel (cardiovascular) function.  Iron. This is important for blood, muscle, and nervous system functioning.  Magnesium. This plays an important role in muscle and nerve function, and it helps to control blood sugar and blood pressure.  Zinc. This is important for the normal function of your nervous system and digestive system (gastrointestinal tract). Nutrition is an essential component of therapy for alcohol abuse. Your health care provider or dietitian will work with you to design a plan that can help restore nutrients to your body and prevent potential complications. WHAT IS MY PLAN? Your dietitian may develop a specific diet plan that is based on your condition and any other complications you may have. A diet plan will  commonly include:  A balanced diet.  Grains: 6-8 oz per day.  Vegetables: 2-3 cups per day.  Fruits: 1-2 cups per day.  Meat and other protein: 5-6 oz per day.  Dairy: 2-3 cups per  day.  Vitamin and mineral supplements. WHAT DO I NEED TO KNOW ABOUT ALCOHOL AND NUTRITION?  Consume foods that are high in antioxidants, such as grapes, berries, nuts, green tea, and dark green and orange vegetables. This can help to counteract some of the stress that is placed on your liver by consuming alcohol.  Avoid food and drinks that are high in fat and sugar. Foods such as sugared soft drinks, salty snack foods, and candy contain empty calories. This means that they lack important nutrients such as protein, fiber, and vitamins.  Eat frequent meals and snacks. Try to eat 5-6 small meals each day.  Eat a variety of fresh fruits and vegetables each day. This will help you get plenty of water, fiber, and vitamins in your diet.  Drink plenty of water and other clear fluids. Try to drink at least 48-64 oz (1.5-2 L) of water per day.  If you are a vegetarian, eat a variety of protein-rich foods. Pair whole grains with plant-based proteins at meals and snacks to obtain the greatest nutrient benefit from your food. For example, eat rice with beans, put peanut butter on whole-grain toast, or eat oatmeal with sunflower seeds.  Soak beans and whole grains overnight before cooking. This can help your body to absorb the nutrients more easily.  Include foods fortified with vitamins and minerals in your diet. Commonly fortified foods include milk, orange juice, cereal, and bread.  If you are malnourished, your dietitian may recommend a high-protein, high-calorie diet. This may include:  2,000-3,000 calories (kilocalories) per day.  70-100 grams of protein per day.  Your health care provider may recommend a complete nutritional supplement beverage. This can help to restore calories, protein, and vitamins to your body. Depending on your condition, you may be advised to consume this instead of or in addition to meals.  Limit your intake of caffeine. Replace drinks like coffee and black tea with  decaffeinated coffee and herbal tea.  Eat a variety of foods that are high in omega fatty acids. These include fish, nuts and seeds, and soybeans. These foods may help your liver to recover and may also stabilize your mood.  Certain medicines may cause changes in your appetite, taste, and weight. Work with your health care provider and dietitian to make any adjustments to your medicines and diet plan.  Include other healthy lifestyle choices in your daily routine.  Be physically active.  Get enough sleep.  Spend time doing activities that you enjoy.  If you are unable to take in enough food and calories by mouth, your health care provider may recommend a feeding tube. This is a tube that passes through your nose and throat, directly into your stomach. Nutritional supplement beverages can be given to you through the feeding tube to help you get the nutrients you need.  Take vitamin or mineral supplements as recommended by your health care provider. WHAT FOODS CAN I EAT? Grains Enriched pasta. Enriched rice. Fortified whole-grain bread. Fortified whole-grain cereal. Barley. Brown rice. Quinoa. Millet. Vegetables All fresh, frozen, and canned vegetables. Spinach. Kale. Artichoke. Carrots. Winter squash and pumpkin. Sweet potatoes. Broccoli. Cabbage. Cucumbers. Tomatoes. Sweet peppers. Green beans. Peas. Corn. Fruits All fresh and frozen fruits. Berries. Grapes. Mango. Papaya. Guava. Cherries. Apples. Bananas. Peaches.  Plums. Pineapple. Watermelon. Cantaloupe. Oranges. Avocado. Meats and Other Protein Sources Beef liver. Lean beef. Pork. Fresh and canned chicken. Fresh fish. Oysters. Sardines. Canned tuna. Shrimp. Eggs with yolks. Nuts and seeds. Peanut butter. Beans and lentils. Soybeans. Tofu. Dairy Whole, low-fat, and nonfat milk. Whole, low-fat, and nonfat yogurt. Cottage cheese. Sour cream. Hard and soft cheeses. Beverages Water. Herbal tea. Decaffeinated coffee. Decaffeinated green  tea. 100% fruit juice. 100% vegetable juice. Instant breakfast shakes. Condiments Ketchup. Mayonnaise. Mustard. Salad dressing. Barbecue sauce. Sweets and Desserts Sugar-free ice cream. Sugar-free pudding. Sugar-free gelatin. Fats and Oils Butter. Vegetable oil, flaxseed oil, olive oil, and walnut oil. Other Complete nutrition shakes. Protein bars. Sugar-free gum. The items listed above may not be a complete list of recommended foods or beverages. Contact your dietitian for more options. WHAT FOODS ARE NOT RECOMMENDED? Grains Sugar-sweetened breakfast cereals. Flavored instant oatmeal. Fried breads. Vegetables Breaded or deep-fried vegetables. Fruits Dried fruit with added sugar. Candied fruit. Canned fruit in syrup. Meats and Other Protein Sources Breaded or deep-fried meats. Dairy Flavored milks. Fried cheese curds or fried cheese sticks. Beverages Alcohol. Sugar-sweetened soft drinks. Sugar-sweetened tea. Caffeinated coffee and tea. Condiments Sugar. Honey. Agave nectar. Molasses. Sweets and Desserts Chocolate. Cake. Cookies. Candy. Other Potato chips. Pretzels. Salted nuts. Candied nuts. The items listed above may not be a complete list of foods and beverages to avoid. Contact your dietitian for more information.   This information is not intended to replace advice given to you by your health care provider. Make sure you discuss any questions you have with your health care provider.   Document Released: 07/05/2005 Document Revised: 10/01/2014 Document Reviewed: 04/13/2014 Elsevier Interactive Patient Education [employer]

## 2015-09-22 NOTE — Consult Note (Signed)
[facility] Consult   Reason for Consult:  Consult for this 54 year old man with a history of alcohol abuse and substance-induced mood symptoms or came into the hospital with reports of some suicidal thinking and return to substance abuse Referring Physician:  Joni Fears Patient Identification: Jose [name] MRN:  [id number] Principal Diagnosis: Substance induced mood disorder ([place]) Diagnosis:   Patient Active Problem List   Diagnosis Date Noted  . Suicidal ideation [R45.851] 09/22/2015  . Cocaine abuse [F14.10] 09/22/2015  . Noncompliance with treatment [Z91.19] 09/22/2015  . Substance induced mood disorder ([place]) [F19.94] 09/06/2015  . Alcohol abuse [F10.10] 09/06/2015  . Hepatitis C [B19.20] 09/06/2015    Total Time spent with patient: 1 hour  Subjective:   Jose [name] is a 54 y.o. male patient admitted with "I'm not feeling more the damn".  HPI:  Patient interviewed. Chart reviewed. Old notes reviewed. Labs and vital signs reviewed. 54 year old man with long-standing alcohol abuse problem came in to the hospital saying he's been depressed for the last couple days. He said he felt down and had suicidal thoughts but did not have any specific plan in mind. He denies having any intention or plan or wish to actually die at this point. His mood is still feeling down and he feels nervous and jittery. He admits that he has been drinking heavily but says that he hasn't had a drink in about 36 hours now. His last cocaine use was about 2 days ago as well. Denies that he's been having any hallucinations. Has chronic stress from living alone not working having minimal social support. He is not currently taking any appropriate medicine. He has been referred to outpatient mental health and substance abuse treatment but has a history of chronic noncompliance with treatment plan.  Social history: Patient lives alone. Minimal social contact. Not working currently.  Medical history:  History of high blood pressure and hepatitis C not currently receiving treatment.  Substance abuse history: Recurrent history of alcohol abuse and intermittent cocaine abuse. Mood tends to get down and negative when he is drinking. No history of delirium tremens identified  Past Psychiatric History: Patient has had hospitalizations in the past under similar circumstances. No history of actually trying to kill himself in the past. Has been referred for outpatient treatment but does not follow up with her regularly. Usually once he sobers up his mood improves a great deal. No history of psychosis no history of bipolar disorder.  Risk to Self: Suicidal Ideation: No-Not Currently/Within Last 6 Months Suicidal Intent: No Is patient at risk for suicide?: No Suicidal Plan?: No Access to Means: No What has been your use of drugs/alcohol within the last 12 months?: ETOh and cocaine How many times?: 0 Other Self Harm Risks: none reported Triggers for Past Attempts: Unknown Intentional Self Injurious Behavior: None Risk to Others: Homicidal Ideation: No Thoughts of Harm to Others: No Current Homicidal Intent: No Current Homicidal Plan: No Access to Homicidal Means: No Identified Victim: None identified History of harm to others?: No Assessment of Violence: None Noted Violent Behavior Description: Patient denies Does patient have access to weapons?: No Criminal Charges Pending?: No Does patient have a court date: No Prior Inpatient Therapy: Prior Inpatient Therapy: Yes Prior Therapy Dates: 08/2015 Prior Therapy Facilty/Provider(s): [facility] Reason for Treatment: Alcohol  Prior Outpatient Therapy: Prior Outpatient Therapy: No Prior Therapy Dates: n/a Prior Therapy Facilty/Provider(s): N/A Reason for Treatment: N/A Does patient have an ACCT team?: No Does patient have Intensive In-House Services?  :  No Does patient have [facility] services? : No Does patient have P4[facility] services?: No  Past Medical  History:  Past Medical History  Diagnosis Date  . Hypertension   . Hep C w/o coma, chronic ([place])    History reviewed. No pertinent past surgical history. Family History: No family history on file. Family Psychiatric  History: Patient states there is a positive family history of alcohol abuse problems and several people. Social History:  History  Alcohol Use  . Yes     History  Drug Use  . Yes  . Special: Cocaine    Social History   Social History  . Marital Status: Divorced    Spouse Name: N/A  . Number of Children: N/A  . Years of Education: N/A   Social History Main Topics  . Smoking status: Current Every Day Smoker -- 0.50 packs/day  . Smokeless tobacco: None  . Alcohol Use: Yes  . Drug Use: Yes    Special: Cocaine  . Sexual Activity: Not Asked   Other Topics Concern  . None   Social History Narrative   Additional Social History:    History of alcohol / drug use?: Yes Name of Substance 1: ETOH 1 - Age of First Use: 15 1 - Amount (size/oz): 8 to 10 beers 1 - Frequency: daily 1 - Duration: 30 years 1 - Last Use / Amount: 09/21/2015 Name of Substance 2: cocaine 2 - Age of First Use: 19 2 - Amount (size/oz): unsure 2 - Frequency: varied 2 - Last Use / Amount: 09/21/2015                 Allergies:  No Known Allergies  Labs:  Results for orders placed or performed during the hospital encounter of 09/21/15 (from the past 48 hour(s))  Comprehensive metabolic panel     Status: Abnormal   Collection Time: 09/21/15  7:01 PM  Result Value Ref Range   Sodium 132 (L) 135 - 145 mmol/L   Potassium 3.7 3.5 - 5.1 mmol/L   Chloride 98 (L) 101 - 111 mmol/L   CO2 24 22 - 32 mmol/L   Glucose, Bld 323 (H) 65 - 99 mg/dL   BUN 11 6 - 20 mg/dL   Creatinine, Ser 0.88 0.61 - 1.24 mg/dL   Calcium 9.1 8.9 - 10.3 mg/dL   Total Protein 7.9 6.5 - 8.1 g/dL   Albumin 4.1 3.5 - 5.0 g/dL   AST 167 (H) 15 - 41 U/L   ALT 88 (H) 17 - 63 U/L   Alkaline Phosphatase 92 38 -  126 U/L   Total Bilirubin 1.2 0.3 - 1.2 mg/dL   GFR calc non Af Amer >60 >60 mL/min   GFR calc Af Amer >60 >60 mL/min    Comment: (NOTE) The eGFR has been calculated using the CKD EPI equation. This calculation has not been validated in all clinical situations. eGFR's persistently <60 mL/min signify possible Chronic Kidney Disease.    Anion gap 10 5 - 15  Ethanol (ETOH)     Status: None   Collection Time: 09/21/15  7:01 PM  Result Value Ref Range   Alcohol, Ethyl (B) <5 <5 mg/dL    Comment:        LOWEST DETECTABLE LIMIT FOR SERUM ALCOHOL IS 5 mg/dL FOR MEDICAL PURPOSES ONLY   Salicylate level     Status: None   Collection Time: 09/21/15  7:01 PM  Result Value Ref Range   Salicylate Lvl <2.8 2.8 - 30.0  mg/dL  Acetaminophen level     Status: Abnormal   Collection Time: 09/21/15  7:01 PM  Result Value Ref Range   Acetaminophen (Tylenol), Serum <10 (L) 10 - 30 ug/mL    Comment:        THERAPEUTIC CONCENTRATIONS VARY SIGNIFICANTLY. A RANGE OF 10-30 ug/mL MAY BE AN EFFECTIVE CONCENTRATION FOR MANY PATIENTS. HOWEVER, SOME ARE BEST TREATED AT CONCENTRATIONS OUTSIDE THIS RANGE. ACETAMINOPHEN CONCENTRATIONS >150 ug/mL AT 4 HOURS AFTER INGESTION AND >50 ug/mL AT 12 HOURS AFTER INGESTION ARE OFTEN ASSOCIATED WITH TOXIC REACTIONS.   CBC     Status: Abnormal   Collection Time: 09/21/15  7:01 PM  Result Value Ref Range   WBC 7.6 3.8 - 10.6 K/uL   RBC 4.58 4.40 - 5.90 MIL/uL   Hemoglobin 14.9 13.0 - 18.0 g/dL   HCT 44.1 40.0 - 52.0 %   MCV 96.4 80.0 - 100.0 fL   MCH 32.5 26.0 - 34.0 pg   MCHC 33.7 32.0 - 36.0 g/dL   RDW 13.1 11.5 - 14.5 %   Platelets 102 (L) 150 - 440 K/uL  Troponin I     Status: None   Collection Time: 09/21/15  7:01 PM  Result Value Ref Range   Troponin I <0.03 <0.031 ng/mL    Comment:        NO INDICATION OF MYOCARDIAL INJURY.   Urine Drug Screen, Qualitative ([facility] only)     Status: Abnormal   Collection Time: 09/21/15  7:30 PM  Result Value Ref  Range   Tricyclic, Ur Screen NONE DETECTED NONE DETECTED   Amphetamines, Ur Screen NONE DETECTED NONE DETECTED   MDMA (Ecstasy)Ur Screen NONE DETECTED NONE DETECTED   Cocaine Metabolite,Ur [place] POSITIVE (A) NONE DETECTED   Opiate, Ur Screen NONE DETECTED NONE DETECTED   Phencyclidine (PCP) Ur S NONE DETECTED NONE DETECTED   Cannabinoid 50 Ng, Ur  NONE DETECTED NONE DETECTED   Barbiturates, Ur Screen NONE DETECTED NONE DETECTED   Benzodiazepine, Ur Scrn NONE DETECTED NONE DETECTED   Methadone Scn, Ur NONE DETECTED NONE DETECTED    Comment: (NOTE) 377  Tricyclics, urine               Cutoff 1000 ng/mL 200  Amphetamines, urine             Cutoff 1000 ng/mL 300  MDMA (Ecstasy), urine           Cutoff 500 ng/mL 400  Cocaine Metabolite, urine       Cutoff 300 ng/mL 500  Opiate, urine                   Cutoff 300 ng/mL 600  Phencyclidine (PCP), urine      Cutoff 25 ng/mL 700  Cannabinoid, urine              Cutoff 50 ng/mL 800  Barbiturates, urine             Cutoff 200 ng/mL 900  Benzodiazepine, urine           Cutoff 200 ng/mL 1000 Methadone, urine                Cutoff 300 ng/mL 1100 1200 The urine drug screen provides only a preliminary, unconfirmed 1300 analytical test result and should not be used for non-medical 1400 purposes. Clinical consideration and professional judgment should 1500 be applied to any positive drug screen result due to possible 1600 interfering substances. A more specific alternate chemical method 1700  must be used in order to obtain a confirmed analytical result.  1800 Gas chromato graphy / mass spectrometry (GC/MS) is the preferred 1900 confirmatory method.     Current Facility-Administered Medications  Medication Dose Route Frequency Provider Last Rate Last Dose  . LORazepam (ATIVAN) injection 0-4 mg  0-4 mg Intravenous 4 times per day Hinda Kehr, MD      . LORazepam (ATIVAN) injection 0-4 mg  0-4 mg Intravenous Q12H Hinda Kehr, MD      . LORazepam  (ATIVAN) tablet 0-4 mg  0-4 mg Oral 4 times per day Hinda Kehr, MD   2 mg at 09/21/15 2047  . LORazepam (ATIVAN) tablet 0-4 mg  0-4 mg Oral Q12H Hinda Kehr, MD       No current outpatient prescriptions on file.    Musculoskeletal: Strength & Muscle Tone: within normal limits Gait & Station: unsteady Patient leans: N/A  Psychiatric Specialty Exam: Review of Systems  HENT: Negative.   Eyes: Negative.   Respiratory: Negative.   Cardiovascular: Negative.   Gastrointestinal: Negative.   Musculoskeletal: Negative.   Skin: Negative.   Neurological: Positive for tremors and weakness.  Psychiatric/Behavioral: Positive for substance abuse. Negative for depression, suicidal ideas, hallucinations and memory loss. The patient is nervous/anxious and has insomnia.     Blood pressure 129/101, pulse 62, temperature 98.3 F (36.8 C), temperature source Oral, resp. rate 18, SpO2 91 %.There is no weight on file to calculate BMI.  General Appearance: Disheveled  Eye Contact::  Minimal  Speech:  Slow  Volume:  Decreased  Mood:  Dysphoric  Affect:  Flat  Thought Process:  Coherent  Orientation:  Full (Time, Place, and Person)  Thought Content:  Negative  Suicidal Thoughts:  No  Homicidal Thoughts:  No  Memory:  Immediate;   Fair Recent;   Fair Remote;   Fair  Judgement:  Fair  Insight:  Fair  Psychomotor Activity:  Psychomotor Retardation  Concentration:  Fair  Recall:  [employer] of Knowledge:Fair  Language: Fair  Akathisia:  No  Handed:  Right  AIMS (if indicated):     Assets:  Desire for Improvement Housing Resilience  ADL's:  Intact  Cognition: WNL  Sleep:      Treatment Plan Summary: Plan 54 year old man with alcohol abuse and substance-induced mood disorder. Suicidal ideation appears to have resolved with no current suicidal wish intent or plan. Patient is able to articulate plans for the future. His substance induced mood disorder is gradually improving. Much better than  it was last night and predictably going to get better if he stays sober. Patient has long-standing alcohol abuse and cocaine abuse. He has been counseled about the crucial importance of getting back involved in treatment and will be referred back to [facility] and [facility] for follow-up treatment in the community. No medication prescriptions required at this time. Case reviewed with emergency room doctor. Involuntary commitment paperwork discontinued.  Disposition: Patient does not meet criteria for psychiatric inpatient admission. Supportive therapy provided about ongoing stressors.  John Clapacs 09/22/2015 1:58 PM

## 2015-10-15 ENCOUNTER — Emergency Department
Admission: EM | Admit: 2015-10-15 | Discharge: 2015-10-17 | Disposition: A | Discharge status: Other Acute Inpatient Hospital | Payer: Self-pay | Attending: Emergency Medicine | Admitting: Emergency Medicine

## 2015-10-15 ENCOUNTER — Encounter: Payer: Self-pay | Admitting: Emergency Medicine

## 2015-10-15 DIAGNOSIS — F10120 Alcohol abuse with intoxication, uncomplicated: Secondary | ICD-10-CM | POA: Insufficient documentation

## 2015-10-15 DIAGNOSIS — Y998 Other external cause status: Secondary | ICD-10-CM | POA: Insufficient documentation

## 2015-10-15 DIAGNOSIS — F17200 Nicotine dependence, unspecified, uncomplicated: Secondary | ICD-10-CM | POA: Insufficient documentation

## 2015-10-15 DIAGNOSIS — Y9389 Activity, other specified: Secondary | ICD-10-CM | POA: Insufficient documentation

## 2015-10-15 DIAGNOSIS — R45850 Homicidal ideations: Secondary | ICD-10-CM | POA: Insufficient documentation

## 2015-10-15 DIAGNOSIS — F1414 Cocaine abuse with cocaine-induced mood disorder: Secondary | ICD-10-CM | POA: Insufficient documentation

## 2015-10-15 DIAGNOSIS — F1910 Other psychoactive substance abuse, uncomplicated: Secondary | ICD-10-CM

## 2015-10-15 DIAGNOSIS — I10 Essential (primary) hypertension: Secondary | ICD-10-CM | POA: Insufficient documentation

## 2015-10-15 DIAGNOSIS — S0083XA Contusion of other part of head, initial encounter: Secondary | ICD-10-CM | POA: Insufficient documentation

## 2015-10-15 DIAGNOSIS — Y9289 Other specified places as the place of occurrence of the external cause: Secondary | ICD-10-CM | POA: Insufficient documentation

## 2015-10-15 DIAGNOSIS — F10920 Alcohol use, unspecified with intoxication, uncomplicated: Secondary | ICD-10-CM

## 2015-10-15 DIAGNOSIS — F1994 Other psychoactive substance use, unspecified with psychoactive substance-induced mood disorder: Secondary | ICD-10-CM

## 2015-10-15 LAB — COMPREHENSIVE METABOLIC PANEL
ALBUMIN: 4.2 g/dL (ref 3.5–5.0)
ALT: 68 U/L — ABNORMAL HIGH (ref 17–63)
AST: 118 U/L — AB (ref 15–41)
Alkaline Phosphatase: 93 U/L (ref 38–126)
Anion gap: 8 (ref 5–15)
BUN: 7 mg/dL (ref 6–20)
CHLORIDE: 103 mmol/L (ref 101–111)
CO2: 28 mmol/L (ref 22–32)
Calcium: 9.4 mg/dL (ref 8.9–10.3)
Creatinine, Ser: 0.66 mg/dL (ref 0.61–1.24)
GFR calc Af Amer: >60 mL/min (ref >60)
GLUCOSE: 105 mg/dL — AB (ref 65–99)
POTASSIUM: 3.9 mmol/L (ref 3.5–5.1)
SODIUM: 139 mmol/L (ref 135–145)
Total Bilirubin: 0.6 mg/dL (ref 0.3–1.2)
Total Protein: 8 g/dL (ref 6.5–8.1)

## 2015-10-15 LAB — CBC
HEMATOCRIT: 42 % (ref 40.0–52.0)
HEMOGLOBIN: 14.2 g/dL (ref 13.0–18.0)
MCH: 32.4 pg (ref 26.0–34.0)
MCHC: 33.7 g/dL (ref 32.0–36.0)
MCV: 95.9 fL (ref 80.0–100.0)
Platelets: 97 10*3/uL — ABNORMAL LOW (ref 150–440)
RBC: 4.38 MIL/uL — ABNORMAL LOW (ref 4.40–5.90)
RDW: 13.2 % (ref 11.5–14.5)
WBC: 6.4 10*3/uL (ref 3.8–10.6)

## 2015-10-15 LAB — URINE DRUG SCREEN, QUALITATIVE (ARMC ONLY)
Amphetamines, Ur Screen: NOT DETECTED
BARBITURATES, UR SCREEN: NOT DETECTED
Benzodiazepine, Ur Scrn: NOT DETECTED
COCAINE METABOLITE, UR ~~LOC~~: POSITIVE — AB
Cannabinoid 50 Ng, Ur ~~LOC~~: NOT DETECTED
MDMA (Ecstasy)Ur Screen: NOT DETECTED
METHADONE SCREEN, URINE: NOT DETECTED
OPIATE, UR SCREEN: NOT DETECTED
Phencyclidine (PCP) Ur S: NOT DETECTED
Tricyclic, Ur Screen: NOT DETECTED

## 2015-10-15 LAB — ETHANOL: ALCOHOL ETHYL (B): 326 mg/dL — AB (ref <5)

## 2015-10-15 MED ORDER — LORAZEPAM 2 MG PO TABS
2.0000 mg | ORAL_TABLET | Freq: Once | ORAL | Status: AC
  Administered 2015-10-15: 2 mg via ORAL
  Filled 2015-10-15: qty 1

## 2015-10-15 NOTE — ED Notes (Signed)
Snack and beverage given. 

## 2015-10-15 NOTE — ED Notes (Signed)
MD at bedside. 

## 2015-10-15 NOTE — ED Notes (Signed)
Patient resting quietly in room. No noted distress or abnormal behaviors noted. Will continue 15 minute checks and observation by security camera for safety. 
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
## 2015-10-15 NOTE — ED Notes (Signed)
Psych DR Here

## 2015-10-15 NOTE — Discharge Instructions (Signed)
Alcohol Abuse and Nutrition °Alcohol abuse is any pattern of alcohol consumption that harms your health, relationships, or work. Alcohol abuse can affect how your body breaks down and absorbs nutrients from food by causing your liver to work abnormally. Additionally, many people who abuse alcohol do not eat enough carbohydrates, protein, fat, vitamins, and minerals. This can cause poor nutrition (malnutrition) and a lack of nutrients (nutrient deficiencies), which can lead to further complications. °Nutrients that are commonly lacking (deficient) among people who abuse alcohol include: °· Vitamins. °· Vitamin A. This is stored in your liver. It is important for your vision, metabolism, and ability to fight off infections (immunity). °· B vitamins. These include vitamins such as folate, thiamin, and niacin. These are important in new cell growth and maintenance. °· Vitamin C. This plays an important role in iron absorption, wound healing, and immunity. °· Vitamin D. This is produced by your liver, but you can also get vitamin D from food. Vitamin D is necessary for your body to absorb and use calcium. °· Minerals. °· Calcium. This is important for your bones and your heart and blood vessel (cardiovascular) function. °· Iron. This is important for blood, muscle, and nervous system functioning. °· Magnesium. This plays an important role in muscle and nerve function, and it helps to control blood sugar and blood pressure. °· Zinc. This is important for the normal function of your nervous system and digestive system (gastrointestinal tract). °Nutrition is an essential component of therapy for alcohol abuse. Your health care provider or dietitian will work with you to design a plan that can help restore nutrients to your body and prevent potential complications. °WHAT IS MY PLAN? °Your dietitian may develop a specific diet plan that is based on your condition and any other complications you may have. A diet plan will  commonly include: °· A balanced diet. °¨ Grains: 6-8 oz per day. °¨ Vegetables: 2-3 cups per day. °¨ Fruits: 1-2 cups per day. °¨ Meat and other protein: 5-6 oz per day. °¨ Dairy: 2-3 cups per day. °· Vitamin and mineral supplements. °WHAT DO I NEED TO KNOW ABOUT ALCOHOL AND NUTRITION? °· Consume foods that are high in antioxidants, such as grapes, berries, nuts, green tea, and dark green and orange vegetables. This can help to counteract some of the stress that is placed on your liver by consuming alcohol. °· Avoid food and drinks that are high in fat and sugar. Foods such as sugared soft drinks, salty snack foods, and candy contain empty calories. This means that they lack important nutrients such as protein, fiber, and vitamins. °· Eat frequent meals and snacks. Try to eat 5-6 small meals each day. °· Eat a variety of fresh fruits and vegetables each day. This will help you get plenty of water, fiber, and vitamins in your diet. °· Drink plenty of water and other clear fluids. Try to drink at least 48-64 oz (1.5-2 L) of water per day. °· If you are a vegetarian, eat a variety of protein-rich foods. Pair whole grains with plant-based proteins at meals and snacks to obtain the greatest nutrient benefit from your food. For example, eat rice with beans, put peanut butter on whole-grain toast, or eat oatmeal with sunflower seeds. °· Soak beans and whole grains overnight before cooking. This can help your body to absorb the nutrients more easily. °· Include foods fortified with vitamins and minerals in your diet. Commonly fortified foods include milk, orange juice, cereal, and bread. °· If you   are malnourished, your dietitian may recommend a high-protein, high-calorie diet. This may include:  2,000-3,000 calories (kilocalories) per day.  70-100 grams of protein per day.  Your health care provider may recommend a complete nutritional supplement beverage. This can help to restore calories, protein, and vitamins to  your body. Depending on your condition, you may be advised to consume this instead of or in addition to meals.  Limit your intake of caffeine. Replace drinks like coffee and black tea with decaffeinated coffee and herbal tea.  Eat a variety of foods that are high in omega fatty acids. These include fish, nuts and seeds, and soybeans. These foods may help your liver to recover and may also stabilize your mood.  Certain medicines may cause changes in your appetite, taste, and weight. Work with your health care provider and dietitian to make any adjustments to your medicines and diet plan.  Include other healthy lifestyle choices in your daily routine.  Be physically active.  Get enough sleep.  Spend time doing activities that you enjoy.  If you are unable to take in enough food and calories by mouth, your health care provider may recommend a feeding tube. This is a tube that passes through your nose and throat, directly into your stomach. Nutritional supplement beverages can be given to you through the feeding tube to help you get the nutrients you need.  Take vitamin or mineral supplements as recommended by your health care provider. WHAT FOODS CAN I EAT? Grains Enriched pasta. Enriched rice. Fortified whole-grain bread. Fortified whole-grain cereal. Barley. Brown rice. Quinoa. Erwin. Vegetables All fresh, frozen, and canned vegetables. Spinach. Kale. Artichoke. Carrots. Winter squash and pumpkin. Sweet potatoes. Broccoli. Cabbage. Cucumbers. Tomatoes. Sweet peppers. Green beans. Peas. Corn. Fruits All fresh and frozen fruits. Berries. Grapes. Mango. Papaya. Guava. Cherries. Apples. Bananas. Peaches. Plums. Pineapple. Watermelon. Cantaloupe. Oranges. Avocado. Meats and Other Protein Sources Beef liver. Lean beef. Pork. Fresh and canned chicken. Fresh fish. Oysters. Sardines. Canned tuna. Shrimp. Eggs with yolks. Nuts and seeds. Peanut butter. Beans and lentils. Soybeans.  Tofu. Dairy Whole, low-fat, and nonfat milk. Whole, low-fat, and nonfat yogurt. Cottage cheese. Sour cream. Hard and soft cheeses. Beverages Water. Herbal tea. Decaffeinated coffee. Decaffeinated green tea. 100% fruit juice. 100% vegetable juice. Instant breakfast shakes. Condiments Ketchup. Mayonnaise. Mustard. Salad dressing. Barbecue sauce. Sweets and Desserts Sugar-free ice cream. Sugar-free pudding. Sugar-free gelatin. Fats and Oils Butter. Vegetable oil, flaxseed oil, olive oil, and walnut oil. Other Complete nutrition shakes. Protein bars. Sugar-free gum. The items listed above may not be a complete list of recommended foods or beverages. Contact your dietitian for more options. WHAT FOODS ARE NOT RECOMMENDED? Grains Sugar-sweetened breakfast cereals. Flavored instant oatmeal. Fried breads. Vegetables Breaded or deep-fried vegetables. Fruits Dried fruit with added sugar. Candied fruit. Canned fruit in syrup. Meats and Other Protein Sources Breaded or deep-fried meats. Dairy Flavored milks. Fried cheese curds or fried cheese sticks. Beverages Alcohol. Sugar-sweetened soft drinks. Sugar-sweetened tea. Caffeinated coffee and tea. Condiments Sugar. Honey. Agave nectar. Molasses. Sweets and Desserts Chocolate. Cake. Cookies. Candy. Other Potato chips. Pretzels. Salted nuts. Candied nuts. The items listed above may not be a complete list of foods and beverages to avoid. Contact your dietitian for more information.   This information is not intended to replace advice given to you by your health care provider. Make sure you discuss any questions you have with your health care provider.   Document Released: 07/05/2005 Document Revised: 10/01/2014 Document Reviewed: 04/13/2014 Elsevier Interactive Patient  Education 2016 [employer]  Chemical Dependency Chemical dependency is an addiction to drugs or alcohol. It is characterized by the repeated behavior of seeking out and  using drugs and alcohol despite harmful consequences to the health and safety of ones self and others.  RISK FACTORS There are certain situations or behaviors that increase a person's risk for chemical dependency. These include:  A family history of chemical dependency.  A history of mental health issues, including depression and anxiety.  A home environment where drugs and alcohol are easily available to you.  Drug or alcohol use at a young age. SYMPTOMS  The following symptoms can indicate chemical dependency:  Inability to limit the use of drugs or alcohol.  Nausea, sweating, shakiness, and anxiety that occurs when alcohol or drugs are not being used.  An increase in amount of drugs or alcohol that is necessary to get drunk or high. People who experience these symptoms can assess their use of drugs and alcohol by asking themselves the following questions:  Have you been told by friends or family that they are worried about your use of alcohol or drugs?  Do friends and family ever tell you about things you did while drinking alcohol or using drugs that you do not remember?  Do you lie about using alcohol or drugs or about the amounts you use?  Do you have difficulty completing daily tasks unless you use alcohol or drugs?  Is the level of your work or school performance lower because of your drug or alcohol use?  Do you get sick from using drugs or alcohol but keep using anyway?  Do you feel uncomfortable in social situations unless you use alcohol or drugs?  Do you use drugs or alcohol to help forget problems? An answer of yes to any of these questions may indicate chemical dependency. Professional evaluation is suggested.   This information is not intended to replace advice given to you by your health care provider. Make sure you discuss any questions you have with your health care provider.   Document Released: 09/04/2001 Document Revised: 12/03/2011 Document Reviewed:  11/16/2010 Elsevier Interactive Patient Education 2016 [employer].  Alcohol Intoxication Alcohol intoxication occurs when you drink enough alcohol that it affects your ability to function. It can be mild or very severe. Drinking a lot of alcohol in a short time is called binge drinking. This can be very harmful. Drinking alcohol can also be more dangerous if you are taking medicines or other drugs. Some of the effects caused by alcohol may include:  Loss of coordination.  Changes in mood and behavior.  Unclear thinking.  Trouble talking (slurred speech).  Throwing up (vomiting).  Confusion.  Slowed breathing.  Twitching and shaking (seizures).  Loss of consciousness. HOME CARE  Do not drive after drinking alcohol.  Drink enough water and fluids to keep your pee (urine) clear or pale yellow. Avoid caffeine.  Only take medicine as told by your doctor. GET HELP IF:  You throw up (vomit) many times.  You do not feel better after a few days.  You frequently have alcohol intoxication. Your doctor can help decide if you should see a substance use treatment counselor. GET HELP RIGHT AWAY IF:  You become shaky when you stop drinking.  You have twitching and shaking.  You throw up blood. It may look bright red or like coffee grounds.  You notice blood in your poop (bowel movements).  You become lightheaded or pass out (faint). MAKE  SURE YOU:   Understand these instructions.  Will watch your condition.  Will get help right away if you are not doing well or get worse.   This information is not intended to replace advice given to you by your health care provider. Make sure you discuss any questions you have with your health care provider.   Document Released: 02/27/2008 Document Revised: 05/13/2013 Document Reviewed: 02/13/2013 Elsevier Interactive Patient Education 2016 [employer].  Alcohol Use Disorder Alcohol use disorder is a mental disorder. It is not a  one-time incident of heavy drinking. Alcohol use disorder is the excessive and uncontrollable use of alcohol over time that leads to problems with functioning in one or more areas of daily living. People with this disorder risk harming themselves and others when they drink to excess. Alcohol use disorder also can cause other mental disorders, such as mood and anxiety disorders, and serious physical problems. People with alcohol use disorder often misuse other drugs.  Alcohol use disorder is common and widespread. Some people with this disorder drink alcohol to cope with or escape from negative life events. Others drink to relieve chronic pain or symptoms of mental illness. People with a family history of alcohol use disorder are at higher risk of losing control and using alcohol to excess.  Drinking too much alcohol can cause injury, accidents, and health problems. One drink can be too much when you are:  Working.  Pregnant or breastfeeding.  Taking medicines. Ask your doctor.  Driving or planning to drive. SYMPTOMS  Signs and symptoms of alcohol use disorder may include the following:   Consumption ofalcohol inlarger amounts or over a longer period of time than intended.  Multiple unsuccessful attempts to cutdown or control alcohol use.   A great deal of time spent obtaining alcohol, using alcohol, or recovering from the effects of alcohol (hangover).  A strong desire or urge to use alcohol (cravings).   Continued use of alcohol despite problems at work, school, or home because of alcohol use.   Continued use of alcohol despite problems in relationships because of alcohol use.  Continued use of alcohol in situations when it is physically hazardous, such as driving a car.  Continued use of alcohol despite awareness of a physical or psychological problem that is likely related to alcohol use. Physical problems related to alcohol use can involve the brain, heart, liver, stomach, and  intestines. Psychological problems related to alcohol use include intoxication, depression, anxiety, psychosis, delirium, and dementia.   The need for increased amounts of alcohol to achieve the same desired effect, or a decreased effect from the consumption of the same amount of alcohol (tolerance).  Withdrawal symptoms upon reducing or stopping alcohol use, or alcohol use to reduce or avoid withdrawal symptoms. Withdrawal symptoms include:  Racing heart.  Hand tremor.  Difficulty sleeping.  Nausea.  Vomiting.  Hallucinations.  Restlessness.  Seizures. DIAGNOSIS Alcohol use disorder is diagnosed through an assessment by your health care provider. Your health care provider may start by asking three or four questions to screen for excessive or problematic alcohol use. To confirm a diagnosis of alcohol use disorder, at least two symptoms must be present within a 27-month period. The severity of alcohol use disorder depends on the number of symptoms:  Mild--two or three.  Moderate--four or five.  Severe--six or more. Your health care provider may perform a physical exam or use results from lab tests to see if you have physical problems resulting from alcohol use. Your  health care provider may refer you to a mental health professional for evaluation. TREATMENT  Some people with alcohol use disorder are able to reduce their alcohol use to low-risk levels. Some people with alcohol use disorder need to quit drinking alcohol. When necessary, mental health professionals with specialized training in substance use treatment can help. Your health care provider can help you decide how severe your alcohol use disorder is and what type of treatment you need. The following forms of treatment are available:   Detoxification. Detoxification involves the use of prescription medicines to prevent alcohol withdrawal symptoms in the first week after quitting. This is important for people with a history of  symptoms of withdrawal and for heavy drinkers who are likely to have withdrawal symptoms. Alcohol withdrawal can be dangerous and, in severe cases, cause death. Detoxification is usually provided in a hospital or in-patient substance use treatment facility.  Counseling or talk therapy. Talk therapy is provided by substance use treatment counselors. It addresses the reasons people use alcohol and ways to keep them from drinking again. The goals of talk therapy are to help people with alcohol use disorder find healthy activities and ways to cope with life stress, to identify and avoid triggers for alcohol use, and to handle cravings, which can cause relapse.  Medicines.Different medicines can help treat alcohol use disorder through the following actions:  Decrease alcohol cravings.  Decrease the positive reward response felt from alcohol use.  Produce an uncomfortable physical reaction when alcohol is used (aversion therapy).  Support groups. Support groups are run by people who have quit drinking. They provide emotional support, advice, and guidance. These forms of treatment are often combined. Some people with alcohol use disorder benefit from intensive combination treatment provided by specialized substance use treatment centers. Both inpatient and outpatient treatment programs are available.   This information is not intended to replace advice given to you by your health care provider. Make sure you discuss any questions you have with your health care provider.   Document Released: 10/18/2004 Document Revised: 10/01/2014 Document Reviewed: 12/18/2012 Elsevier Interactive Patient Education 2016 [employer].    Please follow-up with Alcoholics Anonymous and [facility]. Please return for any further problems.

## 2015-10-15 NOTE — ED Notes (Signed)
Pt will be allowed to stay one more night per psychiatrist. Pt made aware. No distress, no complaints. Maintained on 15 minute checks and observation by security camera for safety.

## 2015-10-15 NOTE — ED Notes (Signed)
Dr York Cerise notified of critical ETOH 326

## 2015-10-15 NOTE — ED Notes (Signed)
Writer attempted to speak with pt. Pt ignored Clinical research associate and did not open his eyes. Maintained on 15 minute checks and observation by security camera for safety.

## 2015-10-15 NOTE — ED Notes (Signed)
Patient brought in by ems. Patient states he has been down on his luck. Patient states that he has had thoughts of hurting someone but not a specific person. 

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
## 2015-10-15 NOTE — ED Notes (Signed)
Pt. Noted in room resting quietly;. No complaints or concerns voiced. No distress or abnormal behavior noted. Will continue to monitor with security cameras. Q 15 minute rounds continue. 

## 2015-10-15 NOTE — ED Notes (Signed)
Report received from Zachary George., RN. Pt. Alert and oriented in no distress denies SI, HI, AVH and pain.  Pt. Instructed to come to me with problems or concerns.Will continue to monitor for safety via security cameras and Q 15 minute checks.

## 2015-10-15 NOTE — ED Notes (Signed)
Pt resting in bed. No complaints. No distress. Maintained on 15 minute checks and observation by security camera for safety.

## 2015-10-15 NOTE — ED Notes (Signed)
Patient asleep in room. No noted distress or abnormal behavior. Will continue 15 minute checks and observation by security cameras for safety. 

## 2015-10-15 NOTE — ED Notes (Signed)
Pt. To ED-BHU  from ED ambulatory without difficulty, to room  . Report from RN. Pt. Is alert and oriented, warm and dry in no distress. Pt. Denies SI, HI, and AVH. Pt. Was upset that there was no door on the assigned room for him--#1. Pt. Made aware of security cameras and Q15 minute rounds. Pt. Encouraged to let Nursing staff know of any concerns or needs.
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
## 2015-10-15 NOTE — ED Notes (Signed)
Pt. Noted in room sleeping;. No complaints or concerns voiced. No distress or abnormal behavior noted. Will continue to monitor with security cameras. Q 15 minute rounds continue. 

## 2015-10-15 NOTE — ED Notes (Signed)
Pt moved to room 2. Pt wanted a room with a door.

## 2015-10-15 NOTE — ED Notes (Signed)
Pt. Noted in room lying on the bed. No complaints or concerns voiced. No distress or abnormal behavior noted. Will continue to monitor with security cameras. Q 15 minute rounds continue. 

## 2015-10-15 NOTE — ED Notes (Signed)
Pt spoke with psychiatrist. Pt will be started on Prozac and remain in the BHU to allow him time to "cool off." Maintained on 15 minute checks and observation by security camera for safety.
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
## 2015-10-16 MED ORDER — LORAZEPAM 2 MG PO TABS
2.0000 mg | ORAL_TABLET | Freq: Once | ORAL | Status: AC
  Administered 2015-10-16: 2 mg via ORAL

## 2015-10-16 MED ORDER — LORAZEPAM 1 MG PO TABS
ORAL_TABLET | ORAL | Status: AC
  Administered 2015-10-16: 2 mg via ORAL
  Filled 2015-10-16: qty 2

## 2015-10-16 NOTE — ED Notes (Signed)
Patient resting quietly in room. No noted distress or abnormal behaviors noted. Will continue 15 minute checks and observation by security camera for safety. 

## 2015-10-16 NOTE — ED Notes (Signed)
Pt resting in bed. Voices no complaints, no distress noted. Maintained on 15 minute checks and observation by security camera for safety.

## 2015-10-16 NOTE — ED Notes (Signed)
Pt in dayroom making a phone call. No concerns voiced. No distress noted. Pt cooperative. Maintained on 15 minute checks and observation by security camera for safety.

## 2015-10-16 NOTE — ED Notes (Signed)
Pt irritable. Not forth coming with information to this Clinical research associate. Pt stays in bed with door closed. No distress, no complaints. Maintained on 15 minute checks and observation by security camera for safety.

## 2015-10-16 NOTE — ED Provider Notes (Signed)
-----------------------------------------   6:56 AM on 10/16/2015 -----------------------------------------   BP 157/107 mmHg  Pulse 109  Temp(Src) 97.6 F (36.4 C) (Oral)  Resp 18  Ht  (1.727 m)  Wt 145 lb (65.772 kg)  BMI 22.05 kg/m2  SpO2 99%  Patient's IVC was discontinued yesterday however psychiatry wanted to continue to observe. Plan for them to reassess this morning.   Disposition is pending per Psychiatry/Behavioral Medicine team recommendations.     Phineas Semen, MD 10/16/15 [phone]

## 2015-10-16 NOTE — ED Notes (Signed)
Patient asleep in room. No noted distress or abnormal behavior. Will continue 15 minute checks and observation by security cameras for safety. 
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
## 2015-10-16 NOTE — ED Notes (Signed)
Pt came to nurses station requesting a cola. Pt told another RN he didn't want to be discharged today because he heard it was raining. Pt also stated he would have a ride to [place] if he was discharged tomorrow.  Remains cooperative. Maintained on 15 minute checks and observation by security camera for safety.

## 2015-10-16 NOTE — ED Notes (Signed)
Pt sleeping in room. No comlpaints, no distress. Maintained on 15 minute checks and observation by security camera for safety.
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
## 2015-10-17 DIAGNOSIS — F1910 Other psychoactive substance abuse, uncomplicated: Secondary | ICD-10-CM

## 2015-10-17 DIAGNOSIS — F1994 Other psychoactive substance use, unspecified with psychoactive substance-induced mood disorder: Secondary | ICD-10-CM

## 2015-10-17 NOTE — ED Notes (Signed)
Pt being dc home, given a bus ticket he says he is going to a friends house who he has called
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
## 2015-10-17 NOTE — ED Provider Notes (Signed)
-----------------------------------------   7:00 AM on 10/17/2015 -----------------------------------------   Blood pressure 152/109, pulse 95, temperature 98.8 F (37.1 C), temperature source Oral, resp. rate 20, height  (1.727 m), weight 145 lb (65.772 kg), SpO2 99 %.  The patient had no acute events since last update.  Calm and cooperative at this time.  Disposition is pending per Psychiatry/Behavioral Medicine team recommendations.     Irean Hong, MD 10/17/15 0700

## 2015-10-17 NOTE — ED Provider Notes (Signed)
Vital signs unremarkable and stable. Patient is clinically sober. No evidence of intoxication or withdrawal at the moment. Patient counseled on his polysubstance abuse with alcohol and cocaine. We'll discharge home to follow-up with behavioral medicine. Evaluation by psychiatry shows that he is psychiatrically stable at this time as well.  [name], MD 10/17/15 1017
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
## 2015-10-21 ENCOUNTER — Encounter: Payer: Self-pay | Admitting: *Deleted

## 2015-10-21 DIAGNOSIS — F10129 Alcohol abuse with intoxication, unspecified: Secondary | ICD-10-CM | POA: Insufficient documentation

## 2015-10-21 DIAGNOSIS — F1410 Cocaine abuse, uncomplicated: Secondary | ICD-10-CM | POA: Insufficient documentation

## 2015-10-21 DIAGNOSIS — F17200 Nicotine dependence, unspecified, uncomplicated: Secondary | ICD-10-CM | POA: Insufficient documentation

## 2015-10-21 DIAGNOSIS — I10 Essential (primary) hypertension: Secondary | ICD-10-CM | POA: Insufficient documentation

## 2015-10-21 DIAGNOSIS — F1914 Other psychoactive substance abuse with psychoactive substance-induced mood disorder: Secondary | ICD-10-CM | POA: Insufficient documentation
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
## 2015-10-22 ENCOUNTER — Emergency Department
Admission: EM | Admit: 2015-10-22 | Discharge: 2015-10-24 | Disposition: A | Discharge status: Other Acute Inpatient Hospital | Payer: Self-pay | Attending: Emergency Medicine | Admitting: Emergency Medicine

## 2015-10-22 DIAGNOSIS — F1994 Other psychoactive substance use, unspecified with psychoactive substance-induced mood disorder: Secondary | ICD-10-CM

## 2015-10-22 DIAGNOSIS — F10929 Alcohol use, unspecified with intoxication, unspecified: Secondary | ICD-10-CM

## 2015-10-22 DIAGNOSIS — F1910 Other psychoactive substance abuse, uncomplicated: Secondary | ICD-10-CM

## 2015-10-22 HISTORY — DX: Other psychoactive substance abuse, uncomplicated: F19.10

## 2015-10-22 LAB — COMPREHENSIVE METABOLIC PANEL
ALBUMIN: 4.7 g/dL (ref 3.5–5.0)
ALT: 54 U/L (ref 17–63)
ANION GAP: 9 (ref 5–15)
AST: 65 U/L — AB (ref 15–41)
Alkaline Phosphatase: 108 U/L (ref 38–126)
BUN: 8 mg/dL (ref 6–20)
CHLORIDE: 103 mmol/L (ref 101–111)
CO2: 28 mmol/L (ref 22–32)
Calcium: 9.8 mg/dL (ref 8.9–10.3)
Creatinine, Ser: 0.77 mg/dL (ref 0.61–1.24)
GFR calc Af Amer: >60 mL/min (ref >60)
GFR calc non Af Amer: >60 mL/min (ref >60)
GLUCOSE: 116 mg/dL — AB (ref 65–99)
POTASSIUM: 4 mmol/L (ref 3.5–5.1)
SODIUM: 140 mmol/L (ref 135–145)
Total Bilirubin: 0.3 mg/dL (ref 0.3–1.2)
Total Protein: 8.7 g/dL — ABNORMAL HIGH (ref 6.5–8.1)

## 2015-10-22 LAB — CBC
HEMATOCRIT: 44.8 % (ref 40.0–52.0)
HEMOGLOBIN: 15.3 g/dL (ref 13.0–18.0)
MCH: 33 pg (ref 26.0–34.0)
MCHC: 34.1 g/dL (ref 32.0–36.0)
MCV: 96.7 fL (ref 80.0–100.0)
Platelets: 145 10*3/uL — ABNORMAL LOW (ref 150–440)
RBC: 4.63 MIL/uL (ref 4.40–5.90)
RDW: 13.5 % (ref 11.5–14.5)
WBC: 7.5 10*3/uL (ref 3.8–10.6)

## 2015-10-22 LAB — URINE DRUG SCREEN, QUALITATIVE (ARMC ONLY)
Amphetamines, Ur Screen: NOT DETECTED
BARBITURATES, UR SCREEN: NOT DETECTED
BENZODIAZEPINE, UR SCRN: NOT DETECTED
COCAINE METABOLITE, UR ~~LOC~~: POSITIVE — AB
Cannabinoid 50 Ng, Ur ~~LOC~~: NOT DETECTED
MDMA (Ecstasy)Ur Screen: NOT DETECTED
Methadone Scn, Ur: NOT DETECTED
OPIATE, UR SCREEN: NOT DETECTED
PHENCYCLIDINE (PCP) UR S: NOT DETECTED
Tricyclic, Ur Screen: NOT DETECTED

## 2015-10-22 LAB — SALICYLATE LEVEL: Salicylate Lvl: <4 mg/dL (ref 2.8–30.0)

## 2015-10-22 LAB — ETHANOL: Alcohol, Ethyl (B): 338 mg/dL (ref <5)

## 2015-10-22 LAB — ACETAMINOPHEN LEVEL

## 2015-10-22 MED ORDER — VITAMIN B-1 100 MG PO TABS
100.0000 mg | ORAL_TABLET | Freq: Every day | ORAL | Status: DC
  Administered 2015-10-23 – 2015-10-24 (×2): 100 mg via ORAL
  Filled 2015-10-22 (×2): qty 1

## 2015-10-22 MED ORDER — LORAZEPAM 2 MG/ML IJ SOLN: 0.0000 mg | Freq: Four times a day (QID) | INTRAMUSCULAR | Status: AC

## 2015-10-22 MED ORDER — THIAMINE HCL 100 MG/ML IJ SOLN: 100.0000 mg | Freq: Every day | INTRAMUSCULAR | Status: DC

## 2015-10-22 MED ORDER — LORAZEPAM 2 MG PO TABS
0.0000 mg | ORAL_TABLET | Freq: Four times a day (QID) | ORAL | Status: AC
  Administered 2015-10-22 – 2015-10-23 (×4): 1 mg via ORAL
  Filled 2015-10-22: qty 1

## 2015-10-22 MED ORDER — LORAZEPAM 2 MG/ML IJ SOLN: 0.0000 mg | Freq: Two times a day (BID) | INTRAMUSCULAR | Status: AC

## 2015-10-22 MED ORDER — LORAZEPAM 2 MG PO TABS: 0.0000 mg | ORAL_TABLET | Freq: Two times a day (BID) | ORAL | Status: DC

## 2015-10-22 NOTE — BH Assessment (Addendum)
Assessment Note  Jose [name] is an 55 y.o. male Jose [name] is an 55 y.o. male who presents to the ER due to increase thoughts of SI and the fear he may harm his self. He admits to cocaine and alcohol use.  For the last several weeks, he's been using on a daily basis.  There are several stressors he has identified that are effecting his current mood and mental state. He's currently homeless, no money or income. He's relationship with his family is strained and conflictual. He states he's been living in the woods for a long time and he's at his breaking point.  He denies current involvement with the legal system. He have no history of aggression or violence.  Patient endorse SI with no specific plan. He denies HI and AV/H.   Consulted with [place] Nurse Practitioner Fransisca Kaufmann), who recommends Inpatient Treatment.  Diagnosis: Depression                     Cocaine Use Disorder.  Past Medical History:  Past Medical History  Diagnosis Date  . Hypertension   . Hep C w/o coma, chronic (HCC)   . Polysubstance abuse     History reviewed. No pertinent past surgical history.  Family History: History reviewed. No pertinent family history.  Social History:  reports that he has been smoking.  He has never used smokeless tobacco. He reports that he drinks about 36.0 oz of alcohol per week. He reports that he uses illicit drugs (Cocaine).  Additional Social History:  Alcohol / Drug Use Pain Medications: See PTA Prescriptions: See PTA Over the Counter: See PTA History of alcohol / drug use?: Yes Longest period of sobriety (when/how long): 5 years Negative Consequences of Use: Financial, Personal relationships, Work / School Withdrawal Symptoms: Fever / Chills, Tremors, Nausea / Vomiting, Sweats Substance #1 Name of Substance 1: Cocaine 1 - Age of First Use: 21 1 - Amount (size/oz): $20 to $100 1 - Frequency: Daily 1 - Duration: 15 eyears 1 - Last Use / Amount:  10/21/2015 Substance #2 Name of Substance 2:  Alcohol 2 - Age of First Use: 16 2 - Amount (size/oz): 12 pack 2 - Frequency: Daily 2 - Duration: "Since I was 16 year" 2 - Last Use / Amount: 10/21/2015 Substance #3 Name of Substance 3: THC 3 - Age of First Use: 13 3 - Amount (size/oz): Patient couldn't remember 3 - Frequency: Patient couldn't remember 3 - Duration: Patient couldn't remember 3 - Last Use / Amount: 1994  CIWA: CIWA-Ar BP: 137/76 mmHg Pulse Rate: 91 Nausea and Vomiting: no nausea and no vomiting Tactile Disturbances: none Tremor: no tremor Auditory Disturbances: not present Paroxysmal Sweats: no sweat visible Visual Disturbances: not present Anxiety: no anxiety, at ease Headache, Fullness in Head: none present Agitation: normal activity Orientation and Clouding of Sensorium: oriented and can do serial additions CIWA-Ar Total: 0 COWS:    Allergies: No Known Allergies  Home Medications:  (Not in a hospital admission)  OB/GYN Status:  No LMP for male patient.  General Assessment Data Location of Assessment: [facility] ED TTS Assessment: In system Is this a Tele or Face-to-Face Assessment?: Face-to-Face Is this an Initial Assessment or a Re-assessment for this encounter?: Initial Assessment Marital status: Divorced Sledge name: n/a Is patient pregnant?: No Pregnancy Status: No Living Arrangements: Other (Comment) (Homeless) Can pt return to current living arrangement?: Yes Admission Status: Voluntary Is patient capable of signing voluntary admission?: Yes Referral  Source: Self/Family/Friend Insurance type: None  Medical Screening Exam [facility] Walk-in ONLY) Medical Exam completed: Yes  Crisis Care Plan Living Arrangements: Other (Comment) (Homeless) Legal Guardian: Other: (None) Name of Psychiatrist: None Reported Name of Therapist: None Reported  Education Status Is patient currently in school?: No Current Grade: n/a Highest grade of school patient  has completed: 10th Name of school: [employer] person: NA  Risk to self with the past 6 months Suicidal Ideation: Yes-Currently Present Has patient been a risk to self within the past 6 months prior to admission? : Yes Suicidal Intent: Yes-Currently Present Has patient had any suicidal intent within the past 6 months prior to admission? : Yes Is patient at risk for suicide?: Yes Suicidal Plan?: No ("I don't know. However I can...") Has patient had any suicidal plan within the past 6 months prior to admission? : Other (comment) Access to Means: Yes Specify Access to Suicidal Means: Unknown at this time What has been your use of drugs/alcohol within the last 12 months?: Cocaine Alcohol & THC Previous Attempts/Gestures: No How many times?: 0 Other Self Harm Risks: None Reported Triggers for Past Attempts: None known Intentional Self Injurious Behavior: None Family Suicide History: No Recent stressful life event(s): Financial Problems (Homeless) Persecutory voices/beliefs?: No Depression: Yes Depression Symptoms: Feeling angry/irritable, Feeling worthless/self pity, Guilt, Fatigue, Isolating, Loss of interest in usual pleasures Substance abuse history and/or treatment for substance abuse?: Yes Suicide prevention information given to non-admitted patients: Not applicable  Risk to Others within the past 6 months Homicidal Ideation: No Does patient have any lifetime risk of violence toward others beyond the six months prior to admission? : No Thoughts of Harm to Others: No Comment - Thoughts of Harm to Others: None Reported Current Homicidal Intent: No Current Homicidal Plan: No Access to Homicidal Means: No Describe Access to Homicidal Means: None Reported Identified Victim: None Reported History of harm to others?: No Assessment of Violence: None Noted Violent Behavior Description: None Reported Does patient have access to weapons?: No Criminal Charges Pending?:  No Describe Pending Criminal Charges: None Reported Does patient have a court date: No Is patient on probation?: No  Psychosis Hallucinations: None noted Delusions: None noted  Mental Status Report Appearance/Hygiene: In hospital gown, In scrubs, Unremarkable Eye Contact: Fair Motor Activity: Freedom of movement, Unremarkable Speech: Logical/coherent, Unremarkable Level of Consciousness: Alert Mood: Depressed, Sad, Pleasant Affect: Appropriate to circumstance, Sad Anxiety Level: Minimal Thought Processes: Coherent, Relevant Judgement: Unimpaired Orientation: Person, Place, Situation, Time, Appropriate for developmental age Obsessive Compulsive Thoughts/Behaviors: Minimal  Cognitive Functioning Concentration: Normal Memory: Recent Intact, Remote Intact IQ: Average Insight: Fair Impulse Control: Poor Appetite: Fair Weight Loss: 7 (Within the last 30 days) Weight Gain: 0 Sleep: No Change (Lack of sleep, ongoing problem) Total Hours of Sleep: 3 Vegetative Symptoms: None  ADLScreening [facility] Assessment Services) Patient's cognitive ability adequate to safely complete daily activities?: Yes Patient able to express need for assistance with ADLs?: Yes Independently performs ADLs?: Yes (appropriate for developmental age)  Prior Inpatient Therapy Prior Inpatient Therapy: Yes Prior Therapy Dates: 10/2011 & 10/2010 Prior Therapy Facilty/Provider(s): [facility] Reason for Treatment: Substance Use  Prior Outpatient Therapy Prior Outpatient Therapy: No Prior Therapy Dates: n/a Prior Therapy Facilty/Provider(s): n Reason for Treatment: n/a Does patient have an ACCT team?: No Does patient have [facility] services? : No Does patient have P4CC services?: No  ADL Screening (condition at time of admission) Patient's cognitive ability adequate to safely complete daily activities?: Yes Is the  patient deaf or have difficulty hearing?: No Does the patient have difficulty seeing, even when  wearing glasses/contacts?: No Does the patient have difficulty concentrating, remembering, or making decisions?: No Patient able to express need for assistance with ADLs?: Yes Does the patient have difficulty dressing or bathing?: No Independently performs ADLs?: Yes (appropriate for developmental age) Does the patient have difficulty walking or climbing stairs?: No Weakness of Legs: None Weakness of Arms/Hands: None  Home Assistive Devices/Equipment Home Assistive Devices/Equipment: None  Therapy Consults (therapy consults require a physician order) PT Evaluation Needed: No OT Evalulation Needed: No SLP Evaluation Needed: No Abuse/Neglect Assessment (Assessment to be complete while patient is alone) Physical Abuse: Denies Verbal Abuse: Yes, past (Comment) (Family members) Sexual Abuse: Denies Exploitation of patient/patient's resources: Denies Self-Neglect: Denies Values / Beliefs Cultural Requests During Hospitalization: None Spiritual Requests During Hospitalization: None Consults Spiritual Care Consult Needed: No Social Work Consult Needed: No Merchant navy officer (For Healthcare) Does patient have an advance directive?: No Would patient like information on creating an advanced directive?: No - patient declined information    Additional Information 1:1 In Past 12 Months?: No CIRT Risk: No Elopement Risk: No Does patient have medical clearance?: Yes  Child/Adolescent Assessment Running Away Risk: Denies (Patient is an adult)  Disposition:  Disposition Initial Assessment Completed for this Encounter: Yes Disposition of Patient: Other dispositions (Psych MD to see) Other disposition(s): Other (Comment) (Psych MD to see)  On Site Evaluation by:   Reviewed with Physician:    Lilyan Gilford, MS, LCAS, LPC, NCC, CCSI 10/22/2015 3:55 PM

## 2015-10-22 NOTE — Discharge Instructions (Signed)
You were seen in the emergency department for alcohol intoxication.  Please seek help from the recommended resources for assistance with your alcohol dependence.  If you have any thoughts of hurting herself or others, please call 911 or return to the emergency department.  Please avoid drug and alcohol use.  Never drive a vehicle or operate machinery while intoxicated. ° ° °Alcohol Intoxication °Alcohol intoxication occurs when the amount of alcohol that a person has consumed impairs his or her ability to mentally and physically function. Alcohol directly impairs the normal chemical activity of the brain. Drinking large amounts of alcohol can lead to changes in mental function and behavior, and it can cause many physical effects that can be harmful.  °Alcohol intoxication can range in severity from mild to very severe. Various factors can affect the level of intoxication that occurs, such as the person's age, gender, weight, frequency of alcohol consumption, and the presence of other medical conditions (such as diabetes, seizures, or heart conditions). Dangerous levels of alcohol intoxication may occur when people drink large amounts of alcohol in a short period (binge drinking). Alcohol can also be especially dangerous when combined with certain prescription medicines or "recreational" drugs. °SIGNS AND SYMPTOMS °Some common signs and symptoms of mild alcohol intoxication include: °Loss of coordination. °Changes in mood and behavior. °Impaired judgment. °Slurred speech. °As alcohol intoxication progresses to more severe levels, other signs and symptoms will appear. These may include: °Vomiting. °Confusion and impaired memory. °Slowed breathing. °Seizures. °Loss of consciousness. °DIAGNOSIS  °Your health care provider will take a medical history and perform a physical exam. You will be asked about the amount and type of alcohol you have consumed. Blood tests will be done to measure the concentration of alcohol in  your blood. In many places, your blood alcohol level must be lower than 80 mg/dL (0.08%) to legally drive. However, many dangerous effects of alcohol can occur at much lower levels.  °TREATMENT  °People with alcohol intoxication often do not require treatment. Most of the effects of alcohol intoxication are temporary, and they go away as the alcohol naturally leaves the body. Your health care provider will monitor your condition until you are stable enough to go home. Fluids are sometimes given through an IV access tube to help prevent dehydration.  °HOME CARE INSTRUCTIONS °Do not drive after drinking alcohol. °Stay hydrated. Drink enough water and fluids to keep your urine clear or pale yellow. Avoid caffeine.   °Only take over-the-counter or prescription medicines as directed by your health care provider.   °SEEK MEDICAL CARE IF:  °You have persistent vomiting.   °You do not feel better after a few days. °You have frequent alcohol intoxication. Your health care provider can help determine if you should see a substance use treatment counselor. °SEEK IMMEDIATE MEDICAL CARE IF:  °You become shaky or tremble when you try to stop drinking.   °You shake uncontrollably (seizure).   °You throw up (vomit) blood. This may be bright red or may look like black coffee grounds.   °You have blood in your stool. This may be bright red or may appear as a black, tarry, bad smelling stool.   °You become lightheaded or faint.   °MAKE SURE YOU:  °Understand these instructions. °Will watch your condition. °Will get help right away if you are not doing well or get worse. °Document Released: 06/20/2005 Document Revised: 05/13/2013 Document Reviewed: 02/13/2013 °ExitCare® Patient Information ©2015 [employer]. This information is not intended to replace advice   given to you by your health care provider. Make sure you discuss any questions you have with your health care provider. ° °Finding Treatment for Alcohol and Drug Addiction °It can  be hard to find the right place to get professional treatment. Here are some important things to consider: °There are different types of treatment to choose from. °Some programs are live-in (residential) while others are not (outpatient). Sometimes a combination is offered. °No single type of program is right for everyone. °Most treatment programs involve a combination of education, counseling, and a 12-step, spiritually-based approach. °There are non-spiritually based programs (not 12-step). °Some treatment programs are government sponsored. They are geared for patients without private insurance. °Treatment programs can vary in many respects such as: °Cost and types of insurance accepted. °Types of on-site medical services offered. °Length of stay, setting, and size. °Overall philosophy of treatment.  °A person may need specialized treatment or have needs not addressed by all programs. For example, adolescents need treatment appropriate for their age. Other people have secondary disorders that must be managed as well. Secondary conditions can include mental illness, such as depression or diabetes. Often, a period of detoxification from alcohol or drugs is needed. This requires medical supervision and not all programs offer this. °THINGS TO CONSIDER WHEN SELECTING A TREATMENT PROGRAM  °Is the program certified by the appropriate government agency? Even private programs must be certified and employ certified professionals. °Does the program accept your insurance? If not, can a payment plan be set up? °Is the facility clean, organized, and well run? Do they allow you to speak with graduates who can share their treatment experience with you? Can you tour the facility? Can you meet with staff? °Does the program meet the full range of individual needs? °Does the treatment program address sexual orientation and physical disabilities? Do they provide age, gender, and culturally appropriate treatment services? °Is treatment  available in languages other than English? °Is long-term aftercare support or guidance encouraged and provided? °Is assessment of an individual's treatment plan ongoing to ensure it meets changing needs? °Does the program use strategies to encourage reluctant patients to remain in treatment long enough to increase the likelihood of success? °Does the program offer counseling (individual or group) and other behavioral therapies? °Does the program offer medicine as part of the treatment regimen, if needed? °Is there ongoing monitoring of possible relapse? Is there a defined relapse prevention program? Are services or referrals offered to family members to ensure they understand addiction and the recovery process? This would help them support the recovering individual. °Are 12-step meetings held at the center or is transport available for patients to attend outside meetings? °In countries outside of the [place] and [place], see local directories for contact information for services in your area. °Document Released: 08/09/2005 Document Revised: 12/03/2011 Document Reviewed: 02/19/2008 °ExitCare® Patient Information ©2015 [employer]. This information is not intended to replace advice given to you by your health care provider. Make sure you discuss any questions you have with your health care provider. ° ° °

## 2015-10-22 NOTE — ED Notes (Signed)
Report from Noel, RN 

## 2015-10-22 NOTE — ED Notes (Signed)
Pt updated on move to [facility].

## 2015-10-22 NOTE — ED Notes (Signed)
Pt eating lunch tray  

## 2015-10-22 NOTE — ED Provider Notes (Signed)
[facility] Emergency Department Provider Note  ____________________________________________  Time seen: Approximately 2:31 AM  I have reviewed the triage vital signs and the nursing notes.   HISTORY  Chief Complaint Medical Clearance  History and physical are both limited by severe intoxication  HPI Jose [name] is a 55 y.o. male with chronic polysubstance abuse and frequent emergency department visits presents intoxicated and apparently requesting detox.  At the time of my evaluation he is too intoxicated and somnolent to provide any history, but he is protecting his airway and there is no indication for intervention at this time.  He has a history of similar presentations, is homeless, and frequently comes to the emergency department but does not meet material for inpatient admission or for psychiatric admission.  His symptoms are severe, chronic, and gradual in onset.   Past Medical History  Diagnosis Date  . Hypertension   . Hep C w/o coma, chronic (HCC)   . Polysubstance abuse     Patient Active Problem List   Diagnosis Date Noted  . Polysubstance abuse   . Suicidal ideation 09/22/2015  . Cocaine abuse 09/22/2015  . Noncompliance with treatment 09/22/2015  . Substance induced mood disorder (HCC) 09/06/2015  . Alcohol abuse 09/06/2015  . Hepatitis C 09/06/2015    History reviewed. No pertinent past surgical history.  No current outpatient prescriptions on file.  Allergies Review of patient's allergies indicates no known allergies.  History reviewed. No pertinent family history.  Social History Social History  Substance Use Topics  . Smoking status: Current Every Day Smoker -- 0.50 packs/day for 45 years  . Smokeless tobacco: Never Used  . Alcohol Use: 36.0 oz/week    60 Cans of beer per week    Review of Systems Unable to provide due to severe intoxication and somnolence ____________________________________________   PHYSICAL  EXAM:  VITAL SIGNS: ED Triage Vitals  Enc Vitals Group     BP 10/21/15 2352 144/95 mmHg     Pulse Rate 10/21/15 2352 95     Resp 10/21/15 2352 18     Temp 10/21/15 2352 97.8 F (36.6 C)     Temp Source 10/21/15 2352 Oral     SpO2 10/21/15 2352 100 %     Weight 10/21/15 2352 145 lb (65.772 kg)     Height 10/21/15 2352  (1.727 m)     Head Cir --      Peak Flow --      Pain Score 10/21/15 2353 8     Pain Loc --      Pain Edu? --      Excl. in GC? --     Constitutional: Sleeping, wakes up to forceful physical touch but then goes right back to sleep. Head: Atraumatic. Nose: No congestion/rhinnorhea. Neck: No stridor.  No cervical spine tenderness to palpation. Cardiovascular: Normal rate, regular rhythm. Grossly normal heart sounds.  Good peripheral circulation. Respiratory: Normal respiratory effort.  No retractions. Lungs CTAB. Gastrointestinal: Soft and nontender. No distention. No abdominal bruits. No CVA tenderness. Musculoskeletal: No lower extremity tenderness nor edema.  No joint effusions. Neurologic:  Speech is slurred.  Patient cannot cooperate with neurological exam. Skin:  Skin is warm, dry and intact. No rash noted.   ____________________________________________   LABS (all labs ordered are listed, but only abnormal results are displayed)  Labs Reviewed  COMPREHENSIVE METABOLIC PANEL - Abnormal; Notable for the following:    Glucose, Bld 116 (*)    Total Protein 8.7 (*)  AST 65 (*)    All other components within normal limits  CBC - Abnormal; Notable for the following:    Platelets 145 (*)    All other components within normal limits  URINE DRUG SCREEN, QUALITATIVE ([facility] ONLY) - Abnormal; Notable for the following:    Cocaine Metabolite,Ur [place] POSITIVE (*)    All other components within normal limits  ETHANOL - Abnormal; Notable for the following:    Alcohol, Ethyl (B) 338 (*)    All other components within normal limits  ACETAMINOPHEN LEVEL -  Abnormal; Notable for the following:    Acetaminophen (Tylenol), Serum <10 (*)    All other components within normal limits  SALICYLATE LEVEL   ____________________________________________  EKG  None ____________________________________________  RADIOLOGY   No results found.  ____________________________________________   PROCEDURES  Procedure(s) performed: None  Critical Care performed: No ____________________________________________   INITIAL IMPRESSION / ASSESSMENT AND PLAN / ED COURSE  Pertinent labs & imaging results that were available during my care of the patient were reviewed by me and considered in my medical decision making (see chart for details).  Indication for involuntary commitment.  The patient's alcohol level is 338 and I have put him on CIWA protocol.  We will allow him to sleep here tonight and anticipate discharge in the morning.  I do not believe he needs psychiatric evaluation at this time.  ____________________________________________  FINAL CLINICAL IMPRESSION(S) / ED DIAGNOSES  Final diagnoses:  Alcohol intoxication, with unspecified complication (HCC)  Polysubstance abuse  Substance induced mood disorder (HCC)      NEW MEDICATIONS STARTED DURING THIS VISIT:  New Prescriptions   No medications on file     Loleta Rose, MD 10/22/15 [phone]

## 2015-10-22 NOTE — ED Notes (Signed)
Patient sleeping.  No fine tremors noted.  Skin warm and dry. 

## 2015-10-22 NOTE — ED Notes (Signed)
Report to eric, rn 

## 2015-10-22 NOTE — ED Notes (Signed)
Pt is asleep in bed at this time. Pt did endorse passive SI on admission but contracts for safety. Pt stated he would like inpatient treatment for substance abuse, preferably in [facility]. Medications administered for ETOH withdrawal and pt went to bed shortly afterwards. 15 minute checks are ongoing for safety.

## 2015-10-22 NOTE — ED Notes (Signed)
Patient sleeping.  No fine tremors noted.  Skin warm and dry.

## 2015-10-22 NOTE — ED Notes (Addendum)
BEHAVIORAL HEALTH ROUNDING Patient sleeping: Yes.   Patient alert and oriented: yes Behavior appropriate: Yes.  ; If no, describe:  Nutrition and fluids offered: Yes  Toileting and hygiene offered: Yes  Sitter present: yes Law enforcement present: Yes  

## 2015-10-22 NOTE — ED Notes (Signed)
BEHAVIORAL HEALTH ROUNDING Patient sleeping: Yes.   Patient alert and oriented: yes Behavior appropriate: Yes.  ; If no, describe:  Nutrition and fluids offered: Yes  Toileting and hygiene offered: Yes  Sitter present: yes Law enforcement present: Yes  

## 2015-10-22 NOTE — BH Assessment (Signed)
Assessment Completed.  Consulted with Alveda Reasons, Nurse Practitioner Fransisca Kaufmann) who recommends Inpatient Treatment. ER MD (Dr. Alphonzo Lemmings) informed of this decision.   Spoke with [facility] Charge Nurse Marylu Lund J.) for bed availability. Currently at capacity.   Contacted [facility] Inetta Fermo) for Bed availability. They have no appropriate beds at this time.   TTS to seek placement with other facilities.

## 2015-10-22 NOTE — ED Notes (Signed)
Breakfast tray placed at pt bedside, pt sleeping.  

## 2015-10-22 NOTE — ED Notes (Signed)
Per rover pt "wants a room and ginger ale", this RN to orient pt to room status, rules and snack time, water offered and declined, Pt lying in bed with shirt off, Pt angrily cussing and expressing disgust at the amount of pts in the quad.  Pt threatening to "beat up somebody" "maybe something will happen to my roommate, maybe bad"  Pt repeats threat to beat up someone out of the hospital because "I'm tired of this shit" pt denies wanting detox, pt stopped asking for room downstairs [facility]) when asked why he wanted to go.  Water provided.

## 2015-10-22 NOTE — ED Provider Notes (Signed)
Patient did wake up and have lunch and then went back for a nap. I did wake him up around 2:15 to have a discussion about how is doing. He does seem clinically sober. Patient states he is homeless [place]. He states he's been quite depressed which has been worsening over the past few months and he is had suicidal thoughts. He states that it crosses his mind not to be here anymore. He does not elaborate further on any specific plan. He is voluntary and wants to speak with a psychiatrist.  He is not actively suicidal at the moment is voluntary for evaluation. I did consult TTS as well as psychiatry and disposition will be per psychiatric evaluation.  Governor Rooks, MD 10/22/15 1420

## 2015-10-22 NOTE — ED Notes (Signed)
Report from [place], [place]. Pt sleeping. Will monitor and obtain vital signs.

## 2015-10-22 NOTE — ED Notes (Signed)
Report to Jane, RN  

## 2015-10-22 NOTE — ED Notes (Signed)
MD at bedside. 

## 2015-10-23 LAB — ETHANOL

## 2015-10-23 MED ORDER — LORAZEPAM 1 MG PO TABS
ORAL_TABLET | ORAL | Status: AC
  Administered 2015-10-23: 1 mg via ORAL
  Filled 2015-10-23: qty 1

## 2015-10-23 MED ORDER — LORAZEPAM 1 MG PO TABS
ORAL_TABLET | ORAL | Status: AC
  Filled 2015-10-23: qty 1

## 2015-10-23 MED ORDER — LORAZEPAM 1 MG PO TABS
ORAL_TABLET | ORAL | Status: AC
Start: 2015-10-23 — End: 2015-10-23
  Administered 2015-10-23: 1 mg via ORAL
  Filled 2015-10-23: qty 1

## 2015-10-23 NOTE — ED Provider Notes (Signed)
-----------------------------------------   6:38 AM on 10/23/2015 -----------------------------------------   Blood pressure 146/90, pulse 79, temperature 97.6 F (36.4 C), temperature source Oral, resp. rate 18, height  (1.727 m), weight 145 lb (65.772 kg), SpO2 99 %.  The patient had no acute events since last update.  Calm and cooperative at this time.  Disposition is pending per Psychiatry/Behavioral Medicine team recommendations.     Irean Hong, MD 10/23/15 [phone]

## 2015-10-23 NOTE — ED Notes (Signed)
ENVIRONMENTAL ASSESSMENT Potentially harmful objects out of patient reach: Yes Personal belongings secured: Yes Patient dressed in hospital provided attire only: Yes Plastic bags out of patient reach: Yes Patient care equipment (cords, cables, call bells, lines, and drains) shortened, removed, or accounted for: Yes Equipment and supplies removed from bottom of stretcher: Yes Potentially toxic materials out of patient reach: Yes Sharps container removed or out of patient reach: Yes  Patient currently in room sleeping. Patient received breakfast tray. No signs of distress noted. Maintained on 15 minute checks and observation by security camera for safety.

## 2015-10-23 NOTE — ED Notes (Signed)
Patient had blood drawn and was given  of ativan for withdrawal symptom relief. Patient is calm and cooperative with no complaints at this time. Will continue to monitor. Maintained on 15 minute checks and observation by security camera for safety.

## 2015-10-23 NOTE — ED Notes (Signed)
Patient asleep in room. No noted distress or abnormal behavior. Will continue 15 minute checks and observation by security cameras for safety. 

## 2015-10-23 NOTE — BH Assessment (Signed)
Pt has been referred to:  [facility]- [facility] [place]

## 2015-10-23 NOTE — ED Notes (Signed)
Patient resting quietly in room. Lunch tray received. No noted distress or abnormal behaviors noted. Will continue 15 minute checks and observation by security camera for safety.

## 2015-10-23 NOTE — ED Notes (Signed)
ENVIRONMENTAL ASSESSMENT  Potentially harmful objects out of patient reach: Yes.  Personal belongings secured: Yes.  Patient dressed in hospital provided attire only: Yes.  Plastic bags out of patient reach: Yes.  Patient care equipment (cords, cables, call bells, lines, and drains) shortened, removed, or accounted for: Yes.  Equipment and supplies removed from bottom of stretcher: Yes.  Potentially toxic materials out of patient reach: Yes.  Sharps container removed or out of patient reach: Yes.  BEHAVIORAL HEALTH ROUNDING  Patient sleeping: No.  Patient alert and oriented: yes  Behavior appropriate: Yes. ; If no, describe:  Nutrition and fluids offered: Yes  Toileting and hygiene offered: Yes  Sitter present: not applicable, Q 15 min safety rounds and observation via security camera. Law enforcement present: Yes ODS   ED BHU PLACEMENT JUSTIFICATION  Is the patient under IVC or is there intent for IVC: No Is the patient medically cleared: Yes.  Is there vacancy in the ED BHU: Yes.  Is the population mix appropriate for patient: Yes.  Is the patient awaiting placement in inpatient or outpatient setting: Yes.  Has the patient had a psychiatric consult: Yes.  Survey of unit performed for contraband, proper placement and condition of furniture, tampering with fixtures in bathroom, shower, and each patient room: Yes. ; Findings: All clear  APPEARANCE/BEHAVIOR  calm, cooperative and adequate rapport can be established  NEURO ASSESSMENT  Orientation: time, place and person  Hallucinations: No.None noted (Hallucinations)  Speech: Normal  Gait: normal  RESPIRATORY ASSESSMENT  WNL  CARDIOVASCULAR ASSESSMENT  WNL  GASTROINTESTINAL ASSESSMENT  WNL  EXTREMITIES  WNL  PLAN OF CARE  Provide calm/safe environment. Vital signs assessed twice daily. ED BHU Assessment once each 12-hour shift. Collaborate with intake RN daily or as condition indicates. Assure the ED provider has rounded once  each shift. Provide and encourage hygiene. Provide redirection as needed. Assess for escalating behavior; address immediately and inform ED provider.  Assess family dynamic and appropriateness for visitation as needed: Yes. ; If necessary, describe findings:  Educate the patient/family about BHU procedures/visitation: Yes. ; If necessary, describe findings: Pt is calm and cooperative at this time. Pt understanding and accepting of unit procedures/rules. Will continue to monitor with Q 15 min safety rounds and observation via security camera.

## 2015-10-23 NOTE — ED Notes (Signed)
Patient currently in room resting. No signs of distress noted. Maintained on 15 minute checks and observation by security camera for safety.  

## 2015-10-23 NOTE — ED Notes (Signed)
Patient in room resting. Patient does not have any complaints at this time. Maintained on 15 minute checks and observation by security camera for safety.

## 2015-10-23 NOTE — ED Notes (Signed)
Patient is currently in room resting. No signs of distress at the moment. Maintained on 15 minute checks and observation by security camera for safety.

## 2015-10-23 NOTE — ED Notes (Signed)
BEHAVIORAL HEALTH ROUNDING  Patient sleeping: No.  Patient alert and oriented: yes  Behavior appropriate: Yes. ; If no, describe:  Nutrition and fluids offered: Yes  Toileting and hygiene offered: Yes  Sitter present: not applicable, Q 15 min safety rounds and observation via security camera. Law enforcement present: Yes ODS  

## 2015-10-23 NOTE — ED Notes (Signed)
Patient states that he currently feels suicidal with no specific plan. He denies HI and AVH. Patient states that his "stomach hurts" and said that is was probably for drinking too much. When asked patient refused medicine to help relieve stomach pain. Patient appears to be depressed with a flat affect. He is calm and cooperative. Maintained on 15 minute checks and observation by security camera for safety.

## 2015-10-23 NOTE — ED Notes (Signed)
Patient is in room sleeping. No signs of distress noted. Will continue to monitor for safety. Maintained on 15 minute checks and observation by security camera for safety.

## 2015-10-24 ENCOUNTER — Inpatient Hospital Stay
Admit: 2015-10-24 | Discharge: 2015-10-26 | DRG: 897 | Disposition: A | Discharge status: Home / Self Care | Payer: No Typology Code available for payment source | Source: Ambulatory Visit | Attending: Psychiatry | Admitting: Psychiatry

## 2015-10-24 DIAGNOSIS — G4700 Insomnia, unspecified: Secondary | ICD-10-CM | POA: Diagnosis present

## 2015-10-24 DIAGNOSIS — I10 Essential (primary) hypertension: Secondary | ICD-10-CM | POA: Diagnosis present

## 2015-10-24 DIAGNOSIS — Z590 Homelessness: Secondary | ICD-10-CM

## 2015-10-24 DIAGNOSIS — F17210 Nicotine dependence, cigarettes, uncomplicated: Secondary | ICD-10-CM | POA: Diagnosis present

## 2015-10-24 DIAGNOSIS — B1920 Unspecified viral hepatitis C without hepatic coma: Secondary | ICD-10-CM | POA: Diagnosis present

## 2015-10-24 DIAGNOSIS — F1020 Alcohol dependence, uncomplicated: Secondary | ICD-10-CM

## 2015-10-24 DIAGNOSIS — F10939 Alcohol use, unspecified with withdrawal, unspecified: Secondary | ICD-10-CM

## 2015-10-24 DIAGNOSIS — F17200 Nicotine dependence, unspecified, uncomplicated: Secondary | ICD-10-CM

## 2015-10-24 DIAGNOSIS — R45851 Suicidal ideations: Secondary | ICD-10-CM | POA: Diagnosis present

## 2015-10-24 DIAGNOSIS — F1024 Alcohol dependence with alcohol-induced mood disorder: Principal | ICD-10-CM | POA: Diagnosis present

## 2015-10-24 DIAGNOSIS — F1420 Cocaine dependence, uncomplicated: Secondary | ICD-10-CM | POA: Diagnosis present

## 2015-10-24 DIAGNOSIS — F10239 Alcohol dependence with withdrawal, unspecified: Secondary | ICD-10-CM | POA: Diagnosis present

## 2015-10-24 DIAGNOSIS — F1590 Other stimulant use, unspecified, uncomplicated: Secondary | ICD-10-CM

## 2015-10-24 DIAGNOSIS — F1094 Alcohol use, unspecified with alcohol-induced mood disorder: Secondary | ICD-10-CM

## 2015-10-24 MED ORDER — TRAZODONE HCL 100 MG PO TABS: 100.0000 mg | ORAL_TABLET | Freq: Every evening | ORAL | Status: DC | PRN

## 2015-10-24 MED ORDER — ACETAMINOPHEN 325 MG PO TABS
650.0000 mg | ORAL_TABLET | Freq: Four times a day (QID) | ORAL | Status: DC | PRN
  Administered 2015-10-24 – 2015-10-26 (×2): 650 mg via ORAL
  Filled 2015-10-24 (×2): qty 2

## 2015-10-24 MED ORDER — LORAZEPAM 2 MG PO TABS: 0.0000 mg | ORAL_TABLET | Freq: Two times a day (BID) | ORAL | Status: DC

## 2015-10-24 MED ORDER — NICOTINE 21 MG/24HR TD PT24
21.0000 mg | MEDICATED_PATCH | Freq: Every day | TRANSDERMAL | Status: DC
  Filled 2015-10-24 (×2): qty 1

## 2015-10-24 MED ORDER — CHLORDIAZEPOXIDE HCL 25 MG PO CAPS
50.0000 mg | ORAL_CAPSULE | Freq: Three times a day (TID) | ORAL | Status: DC
  Administered 2015-10-24 – 2015-10-26 (×6): 50 mg via ORAL
  Filled 2015-10-24 (×6): qty 2

## 2015-10-24 MED ORDER — MAGNESIUM HYDROXIDE 400 MG/5ML PO SUSP: 30.0000 mL | Freq: Every day | ORAL | Status: DC | PRN

## 2015-10-24 MED ORDER — INFLUENZA VAC SPLIT QUAD 0.5 ML IM SUSY
0.5000 mL | PREFILLED_SYRINGE | INTRAMUSCULAR | Status: AC
  Administered 2015-10-25: 0.5 mL via INTRAMUSCULAR
  Filled 2015-10-24: qty 0.5

## 2015-10-24 MED ORDER — ALUM & MAG HYDROXIDE-SIMETH 200-200-20 MG/5ML PO SUSP: 30.0000 mL | ORAL | Status: DC | PRN

## 2015-10-24 MED ORDER — VITAMIN B-1 100 MG PO TABS
100.0000 mg | ORAL_TABLET | Freq: Every day | ORAL | Status: DC
  Administered 2015-10-25 – 2015-10-26 (×2): 100 mg via ORAL
  Filled 2015-10-24 (×2): qty 1

## 2015-10-24 MED ORDER — THIAMINE HCL 100 MG/ML IJ SOLN: 100.0000 mg | Freq: Every day | INTRAMUSCULAR | Status: DC

## 2015-10-24 NOTE — BH Assessment (Addendum)
Writer spoke with patient to complete an reassessment for current mental and emotional status. Patient continues to express SI with the plan to walk in traffic.    Patient is to be admitted to [facility] by Dr. Toni Amend.  Attending Physician will be Dr. Ardyth Harps.   Patient has been assigned to room 320, by [facility] Charge Nurse Everardo Pacific.   Intake Paper Work has been signed and placed on patient chart.  ER staff is aware of the admission (Dr. Alphonzo Lemmings, ER MD; Windell Moulding, Patient's Nurse & Nicole Cella  Patient Access).

## 2015-10-24 NOTE — ED Notes (Signed)
Pt asleep at this time and in no distress

## 2015-10-24 NOTE — Progress Notes (Signed)
D:  Patient is a 55 year-old male admitted to [facility] ambulatory without difficulty.  Patient is alert and oriented upon admission. A:  Admission assessment completed without difficulty.  Skin and contraband assessment completed with no skin abnormalities nor contraband found.  Q.15 minute safety checks were implemented at the time of admission.  Patient was oriented to the unit and escorted to room #320. R:  Patient was receptive to and cooperative with admission assessment.  Patient contracts for safety on the unit at this time.

## 2015-10-24 NOTE — H&P (Signed)
Psychiatric Admission Assessment Adult  Patient Identification: Jose [name] MRN:  [id number] Date of Evaluation:  10/24/2015 Chief Complaint:  Depression Principal Diagnosis: Alcohol-induced mood disorder (HCC) Diagnosis:   Patient Active Problem List   Diagnosis Date Noted  . Alcohol-induced mood disorder (HCC) [F10.94] 10/24/2015  . Alcohol use disorder, severe, dependence (HCC) [F10.20] 10/24/2015  . Alcohol withdrawal (HCC) [F10.239] 10/24/2015  . Stimulant use disorder (HCC) (cocaine) [F15.90] 10/24/2015  . Tobacco use disorder [F17.200] 10/24/2015  . Hepatitis C [B19.20] 09/06/2015   History of Present Illness: Patient is a 55 year old divorced Caucasian male from [facility]. The patient has been homeless for the last year and believes in a tent in the woods. He has past history of alcohol and cocaine dependence and has been in our emergency department in the past for withdrawals. His alcohol level at arrival was 338. He was also here back in January 21 his alcohol level as well was about 300.  Patient came into the emergency department having intoxicated requesting detox.  Patient also reported having suicidal ideation. He reported to the staff in the emergency department he was having thoughts about walking in front of traffic.  Patient reported that his as stressors are currently been homeless for about a year, not being able to find a job due to having him extensive criminal record. He has very limited support as his parents have passed away and the only brother he has leaving barely talks to him.  Today during evaluation patient denied having problems with depression. He denied problems with appetite, energy is sleep or concentration. He denied suicidality, homicidality or having auditory or visual hallucinations. The patient requested to be discharged and reported feeling well he is stated that he was "the beer talking".  Substance abuse history: Patient  minimizes the use of alcohol. He says he only drinks 3-4 beers at times. However he is clearly having withdrawal symptoms as his heart rate and blood pressure are elevated and he is somewhat tremulous. The patient says he has been using cocaine several times per week. He also smokes about half a pack a day.  Associated Signs/Symptoms: Depression Symptoms:  denies (Hypo) Manic Symptoms:  denies Anxiety Symptoms:  denies Psychotic Symptoms:  denies PTSD Symptoms: NA Total Time spent with patient: 1 hour  Past Psychiatric History: Patient denies prior psychiatric hospitalizations or suicide attempts. He has been in detox before in our emergency department.  Risk to Self: Is patient at risk for suicide?: Yes Risk to Others:  no Prior Inpatient Therapy:  no  Past Medical History: Denies prior medical history Past Medical History  Diagnosis Date  . Hypertension   . Hep C w/o coma, chronic (HCC)   . Polysubstance abuse    History reviewed. No pertinent past surgical history.  Family History: History reviewed. No pertinent family history.  Family Psychiatric  History: Reports his father suffered from alcoholism. Denies history of mental illness or suicide in his family  Social History: Patient is currently homeless he lives in a tent in the woods. He has been homeless for about a year. He says he has been unable to find a job as he is a felon. He is prior charges for DWIs and possession of a illegal firearm. Patient was in prison back in 1996.   He is currently divorced, does not have any children. Both of his parents passed away. He has a brother but the relationship is strained.  As far as his education he has attended  grade education. History  Alcohol Use  . 36.0 oz/week  . 60 Cans of beer per week     History  Drug Use  . Yes  . Special: Cocaine    Comment: last use 10/20/15    Social History   Social History  . Marital Status: Divorced    Spouse Name: N/A  . Number of  Children: N/A  . Years of Education: N/A   Social History Main Topics  . Smoking status: Current Every Day Smoker -- 0.50 packs/day for 45 years  . Smokeless tobacco: Never Used  . Alcohol Use: 36.0 oz/week    60 Cans of beer per week  . Drug Use: Yes    Special: Cocaine     Comment: last use 10/20/15  . Sexual Activity: No   Other Topics Concern  . None   Social History Narrative    Allergies:  No Known Allergies   Lab Results:  Results for orders placed or performed during the hospital encounter of 10/22/15 (from the past 48 hour(s))  Ethanol     Status: None   Collection Time: 10/23/15 12:43 PM  Result Value Ref Range   Alcohol, Ethyl (B) <5 <5 mg/dL    Comment:        LOWEST DETECTABLE LIMIT FOR SERUM ALCOHOL IS 5 mg/dL FOR MEDICAL PURPOSES ONLY     Metabolic Disorder Labs:  No results found for: HGBA1C, MPG No results found for: PROLACTIN No results found for: CHOL, TRIG, HDL, CHOLHDL, VLDL, LDLCALC  Current Medications: Current Facility-Administered Medications  Medication Dose Route Frequency Provider Last Rate Last Dose  . acetaminophen (TYLENOL) tablet 650 mg  650 mg Oral Q6H PRN Audery Amel, MD      . alum & mag hydroxide-simeth (MAALOX/MYLANTA) 200-200-20 MG/5ML suspension 30 mL  30 mL Oral Q4H PRN Audery Amel, MD      . chlordiazePOXIDE (LIBRIUM) capsule 50 mg  50 mg Oral TID Jimmy Footman, MD      . Melene Muller ON 10/25/2015] Influenza vac split quadrivalent PF (FLUARIX) injection 0.5 mL  0.5 mL Intramuscular Tomorrow-1000 Jimmy Footman, MD      . magnesium hydroxide (MILK OF MAGNESIA) suspension 30 mL  30 mL Oral Daily PRN Audery Amel, MD      . nicotine (NICODERM CQ - dosed in mg/24 hours) patch 21 mg  21 mg Transdermal Daily Jimmy Footman, MD      . Melene Muller ON 10/25/2015] thiamine (VITAMIN B-1) tablet 100 mg  100 mg Oral Daily Audery Amel, MD      . traZODone (DESYREL) tablet 100 mg  100 mg Oral QHS PRN Jimmy Footman, MD       PTA Medications: No prescriptions prior to admission    Musculoskeletal: Strength & Muscle Tone: within normal limits Gait & Station: normal Patient leans: N/A  Psychiatric Specialty Exam: Physical Exam  Constitutional: He is oriented to person, place, and time. He appears well-developed and well-nourished.  HENT:  Head: Normocephalic and atraumatic.  Eyes: Conjunctivae and EOM are normal.  Neck: Normal range of motion.  Respiratory: Breath sounds normal.  Musculoskeletal: Normal range of motion.  Neurological: He is alert and oriented to person, place, and time.  Skin: Skin is warm and dry.    Review of Systems  Constitutional: Negative.   HENT: Negative.   Eyes: Negative.   Respiratory: Negative.   Cardiovascular: Negative.   Gastrointestinal: Negative.   Genitourinary: Negative.   Musculoskeletal: Negative.  Skin: Negative.   Neurological: Negative.   Endo/Heme/Allergies: Negative.   Psychiatric/Behavioral: Negative.     Blood pressure 155/100, pulse 88, temperature 97.9 F (36.6 C), temperature source Oral, resp. rate 18, height  (1.727 m), weight 64.411 kg (142 lb), SpO2 99 %.Body mass index is 21.6 kg/(m^2).  General Appearance: Fairly Groomed  Patent attorney::  Good  Speech:  Clear and Coherent  Volume:  Normal  Mood:  Euthymic  Affect:  Appropriate  Thought Process:  Logical  Orientation:  Full (Time, Place, and Person)  Thought Content:  Hallucinations: None  Suicidal Thoughts:  No  Homicidal Thoughts:  No  Memory:  Immediate;   Good Recent;   Good Remote;   Good  Judgement:  Fair  Insight:  Shallow  Psychomotor Activity:  Normal  Concentration:  Good  Recall:  Good  Fund of Knowledge:Good  Language: Good  Akathisia:  No  Handed:    AIMS (if indicated):     Assets:  Communication Skills Physical Health  ADL's:  Intact  Cognition: WNL  Sleep:        Treatment Plan Summary: Daily contact with patient to  assess and evaluate symptoms and progress in treatment and Medication management   55 year old Caucasian male admitted with a alcohol-induced mood disorder. He is prior history of alcoholism and cocaine dependence. In the emergency department patient was voicing suicidal ideation   Alcohol-induced mood disorder: Patient is currently denies symptoms consistent with depression. Will not start any antidepressants at this time  Insomnia: Patient will be started on trazodone 100 mg by mouth by mouth daily at bedtime when necessary  Alcohol withdrawal: Patient will be started on Librium 50 mg by mouth 3 times a day. CIWA will be checked tid along with VS.  Alcohol and cocaine dependence: We'll try to set up treatment for substance abuse at discharge  Tobacco use disorder: Patient will be started on a nicotine patch 21 mg a day  Precautions every 15 minute checks  Diet regular  Hospitalization status voluntary  Discharge planning: Potential discharge the next 48 hours.   I certify that inpatient services furnished can reasonably be expected to improve the patient's condition.   Jimmy Footman 1/30/20173:01 PM

## 2015-10-24 NOTE — ED Notes (Signed)
Pt adm voluntarily to [facility] med via wc

## 2015-10-24 NOTE — Progress Notes (Signed)
Recreation Therapy Notes  Date: 01.30.17 Time: 3:00 pm Location: Craft Room  Group Topic: Self-expression  Goal Area(s) Addresses:  Patient will identify one color per emotion listed on wheel. Patient will verbalize benefit of using art as a means of self-expression. Patient will verbalize one emotion experienced during group. Patient will be educated on other forms of self-expression.  Behavioral Response: Did not attend  Intervention: Emotion Wheel  Activity: Patients were given an Emotion Wheel worksheet with 7 different emotions and were instructed to pick a color for each emotion.  Education: LRT educated patients on other forms of self-expression.  Education Outcome: Patient did not attend group.  Clinical Observations/Feedback: Patient did not attend group.  Anessia Oakland M, LRT/CTRS 10/24/2015 3:57 PM 

## 2015-10-24 NOTE — BHH Suicide Risk Assessment (Signed)
[facility] Admission Suicide Risk Assessment   Nursing information obtained from:    Demographic factors:    Current Mental Status:    Loss Factors:    Historical Factors:    Risk Reduction Factors:     Total Time spent with patient: 1 hour Principal Problem: Alcohol-induced mood disorder (HCC) Diagnosis:   Patient Active Problem List   Diagnosis Date Noted  . Alcohol-induced mood disorder (HCC) [F10.94] 10/24/2015  . Alcohol use disorder, severe, dependence (HCC) [F10.20] 10/24/2015  . Alcohol withdrawal (HCC) [F10.239] 10/24/2015  . Stimulant use disorder (HCC) (cocaine) [F15.90] 10/24/2015  . Tobacco use disorder [F17.200] 10/24/2015  . Hepatitis C [B19.20] 09/06/2015   Subjective Data:   Continued Clinical Symptoms:  Alcohol Use Disorder Identification Test Final Score (AUDIT): 14 The "Alcohol Use Disorders Identification Test", Guidelines for Use in Primary Care, Second Edition.  World Science writer [facility]). Score between 0-7:  no or low risk or alcohol related problems. Score between 8-15:  moderate risk of alcohol related problems. Score between 16-19:  high risk of alcohol related problems. Score 20 or above:  warrants further diagnostic evaluation for alcohol dependence and treatment.   CLINICAL FACTORS:   Alcohol/Substance Abuse/Dependencies     Psychiatric Specialty Exam: ROS   COGNITIVE FEATURES THAT CONTRIBUTE TO RISK:  None    SUICIDE RISK:   Mild:  Suicidal ideation of limited frequency, intensity, duration, and specificity.  There are no identifiable plans, no associated intent, mild dysphoria and related symptoms, good self-control (both objective and subjective assessment), few other risk factors, and identifiable protective factors, including available and accessible social support.  PLAN OF CARE: admit to [facility]  I certify that inpatient services furnished can reasonably be expected to improve the patient's condition.   Jimmy Footman,  MD 10/24/2015, 3:41 PM

## 2015-10-24 NOTE — ED Notes (Signed)

## 2015-10-24 NOTE — ED Provider Notes (Signed)
-----------------------------------------   6:26 AM on 10/24/2015 -----------------------------------------   Blood pressure 139/90, pulse 107, temperature 98.8 F (37.1 C), temperature source Oral, resp. rate 20, height  (1.727 m), weight 145 lb (65.772 kg), SpO2 100 %.  The patient had no acute events since last update.  Calm and cooperative at this time.  Disposition is pending per Psychiatry/Behavioral Medicine team recommendations.     Irean Hong, MD 10/24/15 [phone]

## 2015-10-25 MED ORDER — TRAZODONE HCL 100 MG PO TABS
100.0000 mg | ORAL_TABLET | Freq: Every day | ORAL | Status: DC
  Administered 2015-10-25: 100 mg via ORAL
  Filled 2015-10-25: qty 1

## 2015-10-25 NOTE — BHH Group Notes (Signed)
BHH Group Notes:  (Nursing/MHT/Case Management/Adjunct)  Date:  10/25/2015  Time:  1:30 AM  Type of Therapy:  Group Therapy  Participation Level:  Active  Participation Quality:  Appropriate  Affect:  Appropriate  Cognitive:  Appropriate  Insight:  Appropriate  Engagement in Group:  Engaged  Modes of Intervention:  n/a  Summary of Progress/Problems:  Jose [name] 10/25/2015, 1:30 AM

## 2015-10-25 NOTE — Progress Notes (Signed)
Patient appears flat. Denies SI/HI/AVH. States he doesn't need to be here anymore. Compliant with medications. Education given with medication. Pain at 8. Safety maintained with 15 min checks.
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
## 2015-10-25 NOTE — Progress Notes (Signed)
Recreation Therapy Notes  Date: 01.31.17 Time: 3:00 pm Location: Craft Room  Group Topic: Goal Setting  Goal Area(s) Addresses:  Patient will write at least one goal. Patient will write at least one obstacle.  Behavioral Response: Did not attend  Intervention: Recovery Goal Chart  Activity: Patients were instructed to make a Recovery Goal Chart including goals, obstacles, the date they started working on their goals, and the date they achieved their goals.  Education: LRT educated patients on ways they can celebrate reaching their goals.  Education Outcome: Patient did not attend group.  Clinical Observations/Feedback: Patient did not attend group.  Jacquelynn Cree, LRT/CTRS 10/25/2015 4:26 PM

## 2015-10-25 NOTE — Progress Notes (Signed)
Recreation Therapy Notes  INPATIENT RECREATION THERAPY ASSESSMENT  Patient Details Name: Jose [name] MRN: [id number] DOB: [birth year] Today's Date: October 29, 2015  Patient Stressors: Death, Other (Comment) (Mother died a couple years ago, bother died 5 years ago; living in the woods)  Coping Skills:   Substance Abuse, Exercise, Art/Dance, Talking, Music, Sports, Other (Comment) (Think about it)  Personal Challenges: Substance Abuse  Leisure Interests (2+):  Sports - Basketball, Individual - Other (Comment) (Being around friends)  Awareness of Community Resources:  Yes  Community Resources:  Park  Current Use: No  If no, Barriers?: Other (Comment) (Doesn't know)  Patient Strengths:  "I love being me"  Patient Identified Areas of Improvement:  Getting a job  Current Recreation Participation:  Hanging out with friends, watching sports  Patient Goal for Hospitalization:  To get out of here  [place] of Residence:  [place] of Residence:  [place]   Current SI (including self-harm):  No  Current HI:  No  Consent to Intern Participation: N/A  Patient refused one-to-one treatment sessions. Due to patient's refusal, LRT will not develop a Recreational Therapy Care Plan at this time. If patient's status changes, LRT will develop a Recreational Therapy Care Plan.  Jacquelynn Cree, LRT/CTRS 10/29/15, 2:36 PM

## 2015-10-25 NOTE — Progress Notes (Signed)
D:  Patient is alert and oriented on the unit this shift.  Patient attended and actively participated in groups today.  Patient denies suicidal ideation, homicidal ideation, auditory or visual hallucinations at the present time.  Patient rates his depression and anxiety today at a "0"  Patient reports his goal for today is to "get clean"   A:  Scheduled medications are administered to patient as per MD orders.  Emotional support and encouragement are provided.  Patient is maintained on q.15 minute safety checks.  Patient is informed to notify staff with questions or concerns. R:  No adverse medication reactions are noted.  Patient is cooperative with medication administration and treatment plan today.  Patient is receptive, calm and cooperative on the unit at this time.  Patient interacts well with others on the unit this shift.  Patient contracts for safety at this time.  Patient remains safe at this time.

## 2015-10-25 NOTE — Progress Notes (Signed)
[facility] MD Progress Note  10/25/2015 12:47 PM Jose [name]  MRN:  [id number] Subjective:  Patient reports doing well.  Denies having SI or HI.  Denies major issues with sleep, appetite, energy or concentration.  Denies having physical complaints or SE from medications.  As of yesterday A.M pt was voicing suicidality to the staff in the ER saying he was thinking about walking in to traffic.  Nursing Staff: No unsafe behaviors since admission  HR and BP continues to be elevated showing signs of alcohol withdrawal.  Principal Problem: Alcohol-induced mood disorder (HCC) Diagnosis:   Patient Active Problem List   Diagnosis Date Noted  . Alcohol-induced mood disorder (HCC) [F10.94] 10/24/2015  . Alcohol use disorder, severe, dependence (HCC) [F10.20] 10/24/2015  . Alcohol withdrawal (HCC) [F10.239] 10/24/2015  . Stimulant use disorder (HCC) (cocaine) [F15.90] 10/24/2015  . Tobacco use disorder [F17.200] 10/24/2015  . Hepatitis C [B19.20] 09/06/2015   Total Time spent with patient: 30 minutes  Sleep: Fair  Appetite:  Good   History of Present Illness: Patient is a 55 year old divorced Caucasian male from [facility]. The patient has been homeless for the last year and believes in a tent in the woods. He has past history of alcohol and cocaine dependence and has been in our emergency department in the past for withdrawals. His alcohol level at arrival was 338. He was also here back in January 21 his alcohol level as well was about 300.  Patient came into the emergency department having intoxicated requesting detox. Patient also reported having suicidal ideation. He reported to the staff in the emergency department he was having thoughts about walking in front of traffic.  Patient reported that his as stressors are currently been homeless for about a year, not being able to find a job due to having him extensive criminal record. He has very limited support as his parents have  passed away and the only brother he has leaving barely talks to him.  Today during evaluation patient denied having problems with depression. He denied problems with appetite, energy is sleep or concentration. He denied suicidality, homicidality or having auditory or visual hallucinations. The patient requested to be discharged and reported feeling well he is stated that he was "the beer talking".  Substance abuse history: Patient minimizes the use of alcohol. He says he only drinks 3-4 beers at times. However he is clearly having withdrawal symptoms as his heart rate and blood pressure are elevated and he is somewhat tremulous. The patient says he has been using cocaine several times per week. He also smokes about half a pack a day.   Past Psychiatric History: Patient denies prior psychiatric hospitalizations or suicide attempts. He has been in detox before in our emergency department.   Past Medical History: Denies prior medical history Past Medical History  Diagnosis Date  . Hypertension   . Hep C w/o coma, chronic (HCC)   . Polysubstance abuse    History reviewed. No pertinent past surgical history.  Family History: History reviewed. No pertinent family history.  Family Psychiatric History: Reports his father suffered from alcoholism. Denies history of mental illness or suicide in his family  Social History: Patient is currently homeless he lives in a tent in the woods. He has been homeless for about a year. He says he has been unable to find a job as he is a felon. He is prior charges for DWIs and possession of a illegal firearm. Patient was in prison back in 1996.  He is currently divorced, does not have any children. Both of his parents passed away. He has a brother but the relationship is strained.  As far as his education he has attended grade education. History  Alcohol Use  . 36.0 oz/week  . 60 Cans of beer per week    History  Drug Use  . Yes   . Special: Cocaine    Comment: last use 10/20/15    Social History   Social History  . Marital Status: Divorced    Spouse Name: N/A  . Number of Children: N/A  . Years of Education: N/A   Social History Main Topics  . Smoking status: Current Every Day Smoker -- 0.50 packs/day for 45 years  . Smokeless tobacco: Never Used  . Alcohol Use: 36.0 oz/week    60 Cans of beer per week  . Drug Use: Yes    Special: Cocaine     Comment: last use 10/20/15  . Sexual Activity: No   Other Topics Concern  . None   Social History Narrative    Allergies: No Known Allergies        Current Medications: Current Facility-Administered Medications  Medication Dose Route Frequency Provider Last Rate Last Dose  . acetaminophen (TYLENOL) tablet 650 mg  650 mg Oral Q6H PRN Audery Amel, MD   650 mg at 10/24/15 2141  . alum & mag hydroxide-simeth (MAALOX/MYLANTA) 200-200-20 MG/5ML suspension 30 mL  30 mL Oral Q4H PRN Audery Amel, MD      . chlordiazePOXIDE (LIBRIUM) capsule 50 mg  50 mg Oral TID Jimmy Footman, MD   50 mg at 10/25/15 0949  . magnesium hydroxide (MILK OF MAGNESIA) suspension 30 mL  30 mL Oral Daily PRN Audery Amel, MD      . nicotine (NICODERM CQ - dosed in mg/24 hours) patch 21 mg  21 mg Transdermal Daily Jimmy Footman, MD   21 mg at 10/24/15 1618  . thiamine (VITAMIN B-1) tablet 100 mg  100 mg Oral Daily Audery Amel, MD   100 mg at 10/25/15 0949  . traZODone (DESYREL) tablet 100 mg  100 mg Oral QHS Jimmy Footman, MD        Lab Results: No results found for this or any previous visit (from the past 48 hour(s)).  Physical Findings: AIMS:  , ,  ,  ,    CIWA:  CIWA-Ar Total: 0 COWS:     Musculoskeletal: Strength & Muscle Tone: within normal limits Gait & Station: normal Patient leans: N/A  Psychiatric Specialty Exam: Review of Systems  Constitutional: Negative.    HENT: Negative.   Eyes: Negative.   Respiratory: Negative.   Cardiovascular: Negative.   Gastrointestinal: Negative.   Genitourinary: Negative.   Musculoskeletal: Negative.   Skin: Negative.   Neurological: Negative.   Endo/Heme/Allergies: Negative.   Psychiatric/Behavioral: Negative.     Blood pressure 131/99, pulse 82, temperature 97.9 F (36.6 C), temperature source Oral, resp. rate 20, height  (1.727 m), weight 64.411 kg (142 lb), SpO2 99 %.Body mass index is 21.6 kg/(m^2).  General Appearance: Fairly Groomed  Patent attorney::  Good  Speech:  Clear and Coherent  Volume:  Normal  Mood:  Euthymic  Affect:  Appropriate  Thought Process:  Linear  Orientation:  Full (Time, Place, and Person)  Thought Content:  Hallucinations: None  Suicidal Thoughts:  No  Homicidal Thoughts:  No  Memory:  Immediate;   Good Recent;   Good Remote;  Good  Judgement:  Fair  Insight:  Fair  Psychomotor Activity:  Normal  Concentration:  Good  Recall:  Good  Fund of Knowledge:Good  Language: Good  Akathisia:  No  Handed:    AIMS (if indicated):     Assets:  Communication Skills Physical Health  ADL's:  Intact  Cognition: WNL  Sleep:  Number of Hours: 5.25   Treatment Plan Summary: Daily contact with patient to assess and evaluate symptoms and progress in treatment and Medication management   55 year old Caucasian male admitted with a alcohol-induced mood disorder. He is prior history of alcoholism and cocaine dependence. In the emergency department patient was voicing suicidal ideation   Alcohol-induced mood disorder: Patient is currently denies symptoms consistent with depression. Will not start any antidepressants at this time  Insomnia: continue trazodone 100 mg by mouth by mouth daily at bedtime when necessary  Alcohol withdrawal: continue librium 50 mg tid as BP and HR still elevated. CIWA has been low.  Will d/c CIWA. VS will be checked q shift.   Alcohol and cocaine  dependence: We'll try to set up treatment for substance abuse at discharge  Tobacco use disorder: continue  nicotine patch 21 mg a day  Precautions every 15 minute checks  Diet regular  Hospitalization status voluntary  Discharge planning: Potential discharge tomorrow.  Dispo: to be determine as he is homeless.  Jimmy Footman 10/25/2015, 12:47 PM

## 2015-10-25 NOTE — Plan of Care (Signed)
Problem: Alteration in mood & ability to function due to Goal: LTG-Patient demonstrates decreased signs of withdrawal (Patient demonstrates decreased signs of withdrawal to the point the patient is safe to return home and continue treatment in an outpatient setting)  Outcome: Progressing Patient demonstrates no signs of withdrawal currently

## 2015-10-25 NOTE — Plan of Care (Signed)
Problem: Alteration in mood & ability to function due to Goal: LTG-Pt reports reduction in suicidal thoughts (Patient reports reduction in suicidal thoughts and is able to verbalize a safety plan for whenever patient is feeling suicidal)  Outcome: Progressing Patient reports no suicidal thoughts currently

## 2015-10-25 NOTE — [facility] Suicide Risk Assessment (Signed)
[facility] INPATIENT:  Family/Significant Other Suicide Prevention Education  Suicide Prevention Education:  Patient Refusal for Family/Significant Other Suicide Prevention Education: The patient Jose [name] has refused to provide written consent for family/significant other to be provided Family/Significant Other Suicide Prevention Education during admission and/or prior to discharge.  Physician notified. Pt refused SPE from CSW with pt  Jose [name] 10/25/2015, 3:47 PM

## 2015-10-25 NOTE — Tx Team (Signed)
Initial Interdisciplinary Treatment Plan   PATIENT STRESSORS: Financial difficulties Substance abuse   PATIENT STRENGTHS: Ability for insight General fund of knowledge Physical Health   PROBLEM LIST: Problem List/Patient Goals Date to be addressed Date deferred Reason deferred Estimated date of resolution  "get treatment for alcohol abuse" 10/24/15     "get my life back on track" 10/24/15                                                DISCHARGE CRITERIA:  Improved stabilization in mood, thinking, and/or behavior Motivation to continue treatment in a less acute level of care Verbal commitment to aftercare and medication compliance  PRELIMINARY DISCHARGE PLAN: Outpatient therapy  PATIENT/FAMIILY INVOLVEMENT: This treatment plan has been presented to and reviewed with the patient, Jose [name].  The patient and family have been given the opportunity to ask questions and make suggestions.  Moshe Salisbury 10/25/2015, 9:00 AM

## 2015-10-26 NOTE — Tx Team (Signed)
Interdisciplinary Treatment Plan Update (Adult)        Date: 10/26/2015   Time Reviewed: 9:30 AM   Progress in Treatment: Improving  Attending groups: Yes  Participating in groups: Yes  Taking medication as prescribed: Yes  Tolerating medication: Yes  Family/Significant other contact made: No, pt refused Patient understands diagnosis: Yes  Discussing patient identified problems/goals with staff: Yes  Medical problems stabilized or resolved: Yes  Denies suicidal/homicidal ideation: Yes  Issues/concerns per patient self-inventory: Yes  Other:   New problem(s) identified: N/A   Discharge Plan or Barriers: Pt refused referrals for therapy and medication management, but reports he plans to apply for admission at [facility] in [place] for inpatient substance abuse treatment  Reason for Continuation of Hospitalization:   Depression   Anxiety   Medication Stabilization   Comments: N/A   Estimated date of discharge: 10/26/2015   Patient is a 55 year old divorced Caucasian male from [place]. The patient has been homeless for the last year and lives in a tent in the woods. He has past history of alcohol and cocaine dependence and has been in our emergency department in the past for withdrawals. .  Pt was admitted the week for the current admission and requested inpatient treatment and then in the same day refused inpatient treatment demanding to be discharged immediately.  Pt then presented to the ED again,  the same night, with passive SI, beifre being re-admitted into behavioral medicine.  Pt's alcohol level at current arrival was 338. When pt was here back in January 21 his alcohol level as well was about 300.  Patient came into the emergency department having intoxicated requesting detox. Patient also reported having suicidal ideation. He reported to the staff in the emergency department he was having thoughts about walking in front of traffic.  Patient  reported that his as stressors are currently been homeless for about a year, not being able to find a job due to having him extensive criminal record. He has very limited support as his parents have passed away and the only brother he has leaving barely talks to him.  Today during evaluation patient denied having problems with depression. He denied problems with appetite, energy is sleep or concentration. He denied suicidality, homicidality or having auditory or visual hallucinations. The patient requested to be discharged and reported feeling well he is stated that he was "the beer talking".   Patient minimizes the use of alcohol. He says he only drinks 3-4 beers at times. However he is clearly having withdrawal symptoms as his heart rate and blood pressure are elevated and he is somewhat tremulous. The patient says he has been using cocaine several times per week. He also smokes about half a pack a day. Patient lives in [place]. Patient will benefit from crisis stabilization, medication evaluation, group therapy, and psycho education in addition to case management for discharge planning. Patient and CSW reviewed pt's identified goals and treatment plan. Pt verbalized understanding and agreed to treatment plan.      Review of initial/current patient goals per problem list:  1. Goal(s): Patient will participate in aftercare plan   Met: Adequate for discharge per MD.  Target date: 3-5 days post admission date   As evidenced by: Patient will participate within aftercare plan AEB aftercare provider and housing plan at discharge being identified.   2/17: Adequate for discharge per MD.  Pt refused referrals for therapy and medication management, but reports he plans to apply  for admission at [facility] in [place] for inpatient substance abuse treatment     2. Goal (s): Patient will exhibit decreased depressive symptoms and suicidal ideations.   Met: Adequate for discharge per MD.   Target date: 3-5 days post admission date   As evidenced by: Patient will utilize self-rating of depression at 3 or below and demonstrate decreased signs of depression or be deemed stable for discharge by MD.   2/17: Adequate for discharge per MD.  Pt denies SI/HI.  Pt reports he is safe for discharge.      3. Goal(s): Patient will demonstrate decreased signs and symptoms of anxiety.   Met: Adequate for discharge per MD.  Target date: 3-5 days post admission date   As evidenced by: Patient will utilize self-rating of anxiety at 3 or below and demonstrated decreased signs of anxiety, or be deemed stable for discharge by MD   2/1: Adequate for discharge per MD.  Pt reports baseline symptoms for anxiety     4. Goal(s): Patient will demonstrate decreased signs of withdrawal due to substance abuse   Met: Yes  Target date: 3-5 days post admission date   As evidenced by: Patient will produce a CIWA/COWS score of 0, have stable vitals signs, and no symptoms of withdrawal   2/1: Per medical chart  patient produced a CIWA/COWS score of 0, has stable vitals signs, and no symptoms of withdrawal      Patient:  Family:  Physician:Hernandez , MD     10/26/2015 9:30 AM  Nursing:Jennifer Orland Mustard , RN    10/26/2015 9:30 AM  Clinical Social Worker: Marylou Flesher, Percy  10/26/2015 9:30 AM  Nursing: Elige Radon     10/26/2015 9:30 AM  Other: , NP       10/26/2015 9:30 AM  Other:        10/26/2015 9:30 AM  Other:        10/26/2015 9:30 AM

## 2015-10-26 NOTE — Progress Notes (Signed)
Patient denies SI/HI/AVH.  Pleasant and cooperative.  Verbalizes that he does not have any desire for follow-up treatment for ETOH.   Discharge instructions given, verbalized understanding.  Personal belongings returned.  Escorted off unit by this Clinical research associate to ED entrance to travel home.

## 2015-10-26 NOTE — [facility] Suicide Risk Assessment (Addendum)
[facility] Discharge Suicide Risk Assessment   Principal Problem: Alcohol-induced mood disorder [facility]) Discharge Diagnoses:  Patient Active Problem List   Diagnosis Date Noted  . Alcohol-induced mood disorder (HCC) [F10.94] 10/24/2015  . Alcohol use disorder, severe, dependence (HCC) [F10.20] 10/24/2015  . Alcohol withdrawal (HCC) [F10.239] 10/24/2015  . Stimulant use disorder (HCC) (cocaine) [F15.90] 10/24/2015  . Tobacco use disorder [F17.200] 10/24/2015  . Hepatitis C [B19.20] 09/06/2015    Total Time spent with patient: 30 minutes   Psychiatric Specialty Exam: ROS                                                         Mental Status Per Nursing Assessment::   On Admission:     Demographic Factors:  Male, Caucasian, Low socioeconomic status, Living alone and Unemployed  Loss Factors: Financial problems/change in socioeconomic status  Historical Factors: Impulsivity  Risk Reduction Factors:   No access to weapons, no family history of suicide, no prior history of suicidal attempts   Continued Clinical Symptoms:  Alcohol/Substance Abuse/Dependencies  Cognitive Features That Contribute To Risk:  None    Suicide Risk:  Minimal: No identifiable suicidal ideation.  Patients presenting with no risk factors but with morbid ruminations; may be classified as minimal risk based on the severity of the depressive symptoms  Follow-up Information    Follow up with [name].   Why:  Pt refuses follow up at [facility] for substance abuse treatment   Contact information:   [address] [place], [place] [address] Phone: [phone]       Jimmy Footman, MD 10/26/2015, 8:41 AM

## 2015-10-26 NOTE — Progress Notes (Signed)
  [facility] Adult Case Management Discharge Plan :  Will you be returning to the same living situation after discharge:  No, pt reports he will be applying for admission to [name] in [place] for inpatient substance abuse treatment At discharge, do you have transportation home?: Pt refused a bus pass from the CSW and reprorts he desires to walk instead Do you have the ability to pay for your medications: Yes,  pt will be provided with prescriptions at discharge  Release of information consent forms completed and in the chart;  Patient's signature needed at discharge.  Patient to Follow up at: Follow-up Information    Follow up with [name].   Why:  Pt refuses follow up at [facility] for substance abuse treatment   Contact information:   [address] [place], [place] [address] Phone: [phone]      Next level of care provider has access to [facility] Link:no  Safety Planning and Suicide Prevention discussed: No, pt refused  Have you used any form of tobacco in the last 30 days? (Cigarettes, Smokeless Tobacco, Cigars, and/or Pipes): Yes  Has patient been referred to the Quitline?: Patient refused referral  Patient has been referred for addiction treatment: Pt. Was offered but refused referral  Mercy Riding 10/26/2015, 10:02 AM

## 2015-10-26 NOTE — Discharge Summary (Signed)
Physician Discharge Summary Note  Patient:  Jose [name] is an 55 y.o., male MRN:  [id number] DOB:  [birth year] Patient phone:  [phone] (home)  Patient address:   [place] [address],  Total Time spent with patient: 30 minutes  Date of Admission:  10/24/2015 Date of Discharge: 10/26/15  Reason for Admission: Suicidal thoughts  Principal Problem: Alcohol-induced mood disorder [facility]) Discharge Diagnoses: Patient Active Problem List   Diagnosis Date Noted  . Alcohol-induced mood disorder ([place]) [F10.94] 10/24/2015  . Alcohol use disorder, severe, dependence ([place]) [F10.20] 10/24/2015  . Alcohol withdrawal ([place]) [F10.239] 10/24/2015  . Stimulant use disorder ([place]) (cocaine) [F15.90] 10/24/2015  . Tobacco use disorder [F17.200] 10/24/2015  . Hepatitis C [B19.20] 09/06/2015   History of Present Illness: Patient is a 55 year old divorced Caucasian male from [place]. The patient has been homeless for the last year and believes in a tent in the woods. He has past history of alcohol and cocaine dependence and has been in our emergency department in the past for withdrawals. His alcohol level at arrival was 338. He was also here back in January 21 his alcohol level as well was about 300.  Patient came into the emergency department having intoxicated requesting detox. Patient also reported having suicidal ideation. He reported to the staff in the emergency department he was having thoughts about walking in front of traffic.  Patient reported that his as stressors are currently been homeless for about a year, not being able to find a job due to having him extensive criminal record. He has very limited support as his parents have passed away and the only brother he has leaving barely talks to him.  Today during evaluation patient denied having problems with depression. He denied problems with appetite, energy is sleep or concentration. He denied suicidality,  homicidality or having auditory or visual hallucinations. The patient requested to be discharged and reported feeling well he is stated that he was "the beer talking".  Substance abuse history: Patient minimizes the use of alcohol. He says he only drinks 3-4 beers at times. However he is clearly having withdrawal symptoms as his heart rate and blood pressure are elevated and he is somewhat tremulous. The patient says he has been using cocaine several times per week. He also smokes about half a pack a day.    Past Psychiatric History: Patient denies prior psychiatric hospitalizations or suicide attempts. He has been in detox before in our emergency department.    Past Medical History: Denies prior medical history Past Medical History  Diagnosis Date  . Hypertension   . Hep C w/o coma, chronic ([place])   . Polysubstance abuse    History reviewed. No pertinent past surgical history.  Family History: History reviewed. No pertinent family history.  Family Psychiatric History: Reports his father suffered from alcoholism. Denies history of mental illness or suicide in his family  Social History: Patient is currently homeless he lives in a tent in the woods. He has been homeless for about a year. He says he has been unable to find a job as he is a felon. He is prior charges for DWIs and possession of a illegal firearm. Patient was in prison back in 1996.   He is currently divorced, does not have any children. Both of his parents passed away. He has a brother but the relationship is strained.  As far as his education he has attended grade education. History  Alcohol Use  . 36.0 oz/week  .  60 Cans of beer per week    History  Drug Use  . Yes  . Special: Cocaine    Comment: last use 10/20/15    Social History   Social History  . Marital Status: Divorced    Spouse Name: N/A  . Number of Children: N/A  . Years of Education: N/A   Social  History Main Topics  . Smoking status: Current Every Day Smoker -- 0.50 packs/day for 45 years  . Smokeless tobacco: Never Used  . Alcohol Use: 36.0 oz/week    60 Cans of beer per week  . Drug Use: Yes    Special: Cocaine     Comment: last use 10/20/15  . Sexual Activity: No   Other Topics Concern  . None   Social History Narrative    Allergies: No Known Allergies   Hospital Course:    55 year old Caucasian male admitted with a alcohol-induced mood disorder. He is prior history of alcoholism and cocaine dependence. In the emergency department patient was voicing suicidal ideation   Alcohol-induced mood disorder: Patient  currently denies symptoms consistent with depression. Will not start any antidepressants at this time  Insomnia: Patient received trazodone 100 mg by mouth daily at bedtime when necessary for insomnia  Alcohol withdrawal: Alcohol withdrawal was uncomplicated. During his stay patient received Librium.  Alcohol and cocaine dependence: Patient declined from a referral to residential or inpatient substance abuse treatment.  He will be set up with outpatient services  Tobacco use disorder: Patient received a nicotine patch 21 mg during his stay in the hospital    Discharge planning: Patient will be discharged today  Dispo: Patient is homeless and has been living in a tent. He wishes to return there. He declined from referral to inpatient substance abuse, residential programs or referral to a homeless shelter.   On the day of the discharge the patient denied having any suicidal ideation, he denied having any symptoms of depression. He denied problems with sleep, appetite, energy or concentration. He denied homicidality or having auditory or visual hallucinations. He denied having any physical complaints.  Patient did not display any unsafe behaviors during his stay in the unit. He was calm, pleasant and cooperative. He  participated actively in programming. There was no need for seclusion, restraints or forced medications.  This hospitalization was uneventful.  We recommend inpatient treatment for substance abuse the patient declined.   Patient denies having any access to weapons and discharge.  Physical Findings: AIMS:  , ,  ,  ,    CIWA:  CIWA-Ar Total: 0 COWS:     Musculoskeletal: Strength & Muscle Tone: within normal limits Gait & Station: normal Patient leans: N/A  Psychiatric Specialty Exam: Review of Systems  Constitutional: Negative.   HENT: Negative.   Eyes: Negative.   Respiratory: Negative.   Cardiovascular: Negative.   Gastrointestinal: Negative.   Genitourinary: Negative.   Musculoskeletal: Negative.   Skin: Negative.   Neurological: Negative.   Endo/Heme/Allergies: Negative.   Psychiatric/Behavioral: Negative.     Blood pressure 130/86, pulse 86, temperature 97.8 F (36.6 C), temperature source Oral, resp. rate 20, height _0  (1.727 m), weight 64.411 kg (142 lb), SpO2 99 %.Body mass index is 21.6 kg/(m^2).  General Appearance: Well Groomed  Engineer, water::  Good  Speech:  Clear and Coherent  Volume:  Normal  Mood:  Euthymic  Affect:  Congruent  Thought Process:  Linear  Orientation:  Full (Time, Place, and Person)  Thought Content:  Hallucinations:  None  Suicidal Thoughts:  No  Homicidal Thoughts:  No  Memory:  Immediate;   Good Recent;   Good Remote;   Good  Judgement:  Fair  Insight:  Shallow  Psychomotor Activity:  Normal  Concentration:  Good  Recall:  Good  Fund of Knowledge:Good  Language: Good  Akathisia:  No  Handed:    AIMS (if indicated):     Assets:  Communication Skills Physical Health  ADL's:  Intact  Cognition: WNL  Sleep:  Number of Hours: 7.25   Have you used any form of tobacco in the last 30 days? (Cigarettes, Smokeless Tobacco, Cigars, and/or Pipes): Yes  Has this patient used any form of tobacco in the last 30 days? (Cigarettes,  Smokeless Tobacco, Cigars, and/or Pipes) Yes, Yes, A prescription for an FDA-approved tobacco cessation medication was offered at discharge and the patient refused  Metabolic Disorder Labs:  No results found for: HGBA1C, MPG No results found for: PROLACTIN No results found for: CHOL, TRIG, HDL, CHOLHDL, VLDL, LDLCALC   Results for ABHIJOT, STRAUGHTER (MRN [id number]) as of 10/26/2015 08:49  Ref. Range 10/22/2015 00:05 10/23/2015 12:43  Sodium Latest Ref Range: 135-145 mmol/L 140   Potassium Latest Ref Range: 3.5-5.1 mmol/L 4.0   Chloride Latest Ref Range: 101-111 mmol/L 103   CO2 Latest Ref Range: 22-32 mmol/L 28   BUN Latest Ref Range: 6-20 mg/dL 8   Creatinine Latest Ref Range: 0.61-1.24 mg/dL 0.77   Calcium Latest Ref Range: 8.9-10.3 mg/dL 9.8   EGFR (Non-African Amer.) Latest Ref Range: >60 mL/min >60   EGFR (African American) Latest Ref Range: >60 mL/min >60   Glucose Latest Ref Range: 65-99 mg/dL 116 (H)   Anion gap Latest Ref Range: 5-15  9   Alkaline Phosphatase Latest Ref Range: 38-126 U/L 108   Albumin Latest Ref Range: 3.5-5.0 g/dL 4.7   AST Latest Ref Range: 15-41 U/L 65 (H)   ALT Latest Ref Range: 17-63 U/L 54   Total Protein Latest Ref Range: 6.5-8.1 g/dL 8.7 (H)   Total Bilirubin Latest Ref Range: 0.3-1.2 mg/dL 0.3   WBC Latest Ref Range: 3.8-10.6 K/uL 7.5   RBC Latest Ref Range: 4.40-5.90 MIL/uL 4.63   Hemoglobin Latest Ref Range: 13.0-18.0 g/dL 15.3   HCT Latest Ref Range: 40.0-52.0 % 44.8   MCV Latest Ref Range: 80.0-100.0 fL 96.7   MCH Latest Ref Range: 26.0-34.0 pg 33.0   MCHC Latest Ref Range: 32.0-36.0 g/dL 34.1   RDW Latest Ref Range: 11.5-14.5 % 13.5   Platelets Latest Ref Range: 150-440 K/uL 660 (L)   Salicylate Lvl Latest Ref Range: 2.8-30.0 mg/dL <4.0   Acetaminophen Latest Ref Range: 10-30 ug/mL <10 (L)   Alcohol, Ethyl (B) Latest Ref Range: <5 mg/dL 338 (HH) <5  Amphetamines, Ur Screen Latest Ref Range: NONE DETECTED  NONE DETECTED   Barbiturates, Ur Screen  Latest Ref Range: NONE DETECTED  NONE DETECTED   Benzodiazepine, Ur Scrn Latest Ref Range: NONE DETECTED  NONE DETECTED   Cocaine Metabolite,Ur [place] Latest Ref Range: NONE DETECTED  POSITIVE (A)   Methadone Scn, Ur Latest Ref Range: NONE DETECTED  NONE DETECTED   MDMA (Ecstasy)Ur Screen Latest Ref Range: NONE DETECTED  NONE DETECTED   Cannabinoid 50 Ng, Ur [place] Latest Ref Range: NONE DETECTED  NONE DETECTED   Opiate, Ur Screen Latest Ref Range: NONE DETECTED  NONE DETECTED   Phencyclidine (PCP) Ur S Latest Ref Range: NONE DETECTED  NONE DETECTED   Tricyclic, Ur Screen Latest  Ref Range: NONE DETECTED  NONE DETECTED     See Psychiatric Specialty Exam and Suicide Risk Assessment completed by Attending Physician prior to discharge.  Discharge destination:  Home  Is patient on multiple antipsychotic therapies at discharge:  No   Has Patient had three or more failed trials of antipsychotic monotherapy by history:  No  Recommended Plan for Multiple Antipsychotic Therapies: NA     Medication List    Notice    You have not been prescribed any medications.         Follow-up Information    Follow up with [name].   Why:  Pt refuses follow up at [facility] for substance abuse treatment   Contact information:   [place], [place] [address] Phone: [phone]       Signed: Hildred Priest, MD 10/26/2015, 8:45 AM

## 2015-10-26 NOTE — Progress Notes (Signed)
D: Observed pt in day room interacting with peers. Patient alert and oriented x4. Patient denies SI/HI/AVH. Pt affect is sad. Pt discussed alcohol and drug abuse as main reasons for admission. Pt did not forward much else during interaction A: Offered active listening and support. Provided therapeutic communication. Administered scheduled medications. Encouraged pt to share thoughts and feelings with staff. Encouraged pt to attend group and actively participate in care. R: Pt pleasant and cooperative. Pt medication compliant. Will continue Q15 min. checks. Safety maintained.

## 2015-10-26 NOTE — Plan of Care (Signed)
Problem: Ineffective individual coping Goal: STG: Patient will remain free from self harm Outcome: Progressing Pt has remained free from harm.     

## 2015-10-31 ENCOUNTER — Encounter: Payer: Self-pay | Admitting: Emergency Medicine

## 2015-10-31 DIAGNOSIS — S0990XA Unspecified injury of head, initial encounter: Secondary | ICD-10-CM | POA: Insufficient documentation

## 2015-10-31 DIAGNOSIS — I10 Essential (primary) hypertension: Secondary | ICD-10-CM | POA: Insufficient documentation

## 2015-10-31 DIAGNOSIS — F1310 Sedative, hypnotic or anxiolytic abuse, uncomplicated: Secondary | ICD-10-CM | POA: Insufficient documentation

## 2015-10-31 DIAGNOSIS — F17200 Nicotine dependence, unspecified, uncomplicated: Secondary | ICD-10-CM | POA: Insufficient documentation

## 2015-10-31 DIAGNOSIS — Z79899 Other long term (current) drug therapy: Secondary | ICD-10-CM | POA: Insufficient documentation

## 2015-10-31 DIAGNOSIS — Y998 Other external cause status: Secondary | ICD-10-CM | POA: Insufficient documentation

## 2015-10-31 DIAGNOSIS — Y9289 Other specified places as the place of occurrence of the external cause: Secondary | ICD-10-CM | POA: Insufficient documentation

## 2015-10-31 DIAGNOSIS — Y9389 Activity, other specified: Secondary | ICD-10-CM | POA: Insufficient documentation

## 2015-10-31 DIAGNOSIS — F1410 Cocaine abuse, uncomplicated: Secondary | ICD-10-CM | POA: Insufficient documentation

## 2015-10-31 DIAGNOSIS — S3991XA Unspecified injury of abdomen, initial encounter: Secondary | ICD-10-CM | POA: Insufficient documentation

## 2015-10-31 DIAGNOSIS — F1010 Alcohol abuse, uncomplicated: Secondary | ICD-10-CM | POA: Insufficient documentation
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
## 2015-11-01 ENCOUNTER — Emergency Department: Payer: Self-pay

## 2015-11-01 ENCOUNTER — Encounter: Payer: Self-pay | Admitting: Radiology

## 2015-11-01 ENCOUNTER — Emergency Department
Admission: EM | Admit: 2015-11-01 | Discharge: 2015-11-01 | Disposition: A | Discharge status: Home / Self Care | Payer: Self-pay | Attending: Emergency Medicine | Admitting: Emergency Medicine

## 2015-11-01 DIAGNOSIS — M7918 Myalgia, other site: Secondary | ICD-10-CM

## 2015-11-01 DIAGNOSIS — F1590 Other stimulant use, unspecified, uncomplicated: Secondary | ICD-10-CM | POA: Diagnosis present

## 2015-11-01 DIAGNOSIS — R45850 Homicidal ideations: Secondary | ICD-10-CM

## 2015-11-01 DIAGNOSIS — F1020 Alcohol dependence, uncomplicated: Secondary | ICD-10-CM | POA: Diagnosis present

## 2015-11-01 DIAGNOSIS — F1910 Other psychoactive substance abuse, uncomplicated: Secondary | ICD-10-CM

## 2015-11-01 LAB — URINALYSIS COMPLETE WITH MICROSCOPIC (ARMC ONLY)
BACTERIA UA: NONE SEEN
BILIRUBIN URINE: NEGATIVE
GLUCOSE, UA: NEGATIVE mg/dL
Hgb urine dipstick: NEGATIVE
Ketones, ur: NEGATIVE mg/dL
Leukocytes, UA: NEGATIVE
NITRITE: NEGATIVE
Protein, ur: NEGATIVE mg/dL
Specific Gravity, Urine: 1.008 (ref 1.005–1.030)
Squamous Epithelial / LPF: NONE SEEN
WBC UA: NONE SEEN WBC/hpf (ref 0–5)
pH: 5 (ref 5.0–8.0)

## 2015-11-01 LAB — CBC WITH DIFFERENTIAL/PLATELET
BASOS ABS: 0.1 10*3/uL (ref 0–0.1)
BASOS PCT: 1 %
EOS PCT: 7 %
Eosinophils Absolute: 0.6 10*3/uL (ref 0–0.7)
HEMATOCRIT: 42.6 % (ref 40.0–52.0)
Hemoglobin: 14.6 g/dL (ref 13.0–18.0)
Lymphocytes Relative: 40 %
Lymphs Abs: 3.4 10*3/uL (ref 1.0–3.6)
MCH: 32.7 pg (ref 26.0–34.0)
MCHC: 34.3 g/dL (ref 32.0–36.0)
MCV: 95.4 fL (ref 80.0–100.0)
MONO ABS: 0.5 10*3/uL (ref 0.2–1.0)
MONOS PCT: 7 %
Neutro Abs: 3.8 10*3/uL (ref 1.4–6.5)
Neutrophils Relative %: 45 %
PLATELETS: 196 10*3/uL (ref 150–440)
RBC: 4.46 MIL/uL (ref 4.40–5.90)
RDW: 13 % (ref 11.5–14.5)
WBC: 8.4 10*3/uL (ref 3.8–10.6)

## 2015-11-01 LAB — COMPREHENSIVE METABOLIC PANEL
ALBUMIN: 4.2 g/dL (ref 3.5–5.0)
ALK PHOS: 90 U/L (ref 38–126)
ALT: 39 U/L (ref 17–63)
AST: 41 U/L (ref 15–41)
Anion gap: 7 (ref 5–15)
BILIRUBIN TOTAL: 0.4 mg/dL (ref 0.3–1.2)
BUN: 12 mg/dL (ref 6–20)
CO2: 28 mmol/L (ref 22–32)
CREATININE: 1 mg/dL (ref 0.61–1.24)
Calcium: 9.4 mg/dL (ref 8.9–10.3)
Chloride: 106 mmol/L (ref 101–111)
GFR calc Af Amer: >60 mL/min (ref >60)
GLUCOSE: 89 mg/dL (ref 65–99)
Potassium: 4.2 mmol/L (ref 3.5–5.1)
Sodium: 141 mmol/L (ref 135–145)
TOTAL PROTEIN: 7.9 g/dL (ref 6.5–8.1)

## 2015-11-01 LAB — URINE DRUG SCREEN, QUALITATIVE (ARMC ONLY)
Amphetamines, Ur Screen: NOT DETECTED
BARBITURATES, UR SCREEN: NOT DETECTED
Benzodiazepine, Ur Scrn: POSITIVE — AB
CANNABINOID 50 NG, UR ~~LOC~~: NOT DETECTED
COCAINE METABOLITE, UR ~~LOC~~: POSITIVE — AB
MDMA (ECSTASY) UR SCREEN: NOT DETECTED
Methadone Scn, Ur: NOT DETECTED
Opiate, Ur Screen: NOT DETECTED
PHENCYCLIDINE (PCP) UR S: NOT DETECTED
TRICYCLIC, UR SCREEN: NOT DETECTED

## 2015-11-01 LAB — SALICYLATE LEVEL: Salicylate Lvl: <4 mg/dL (ref 2.8–30.0)

## 2015-11-01 LAB — ETHANOL: ALCOHOL ETHYL (B): 244 mg/dL — AB (ref <5)

## 2015-11-01 LAB — ACETAMINOPHEN LEVEL

## 2015-11-01 MED ORDER — TRAMADOL HCL 50 MG PO TABS
50.0000 mg | ORAL_TABLET | Freq: Once | ORAL | Status: AC
Start: 2015-11-01 — End: 2015-11-01
  Administered 2015-11-01: 50 mg via ORAL
  Filled 2015-11-01: qty 1

## 2015-11-01 MED ORDER — TRAMADOL HCL 50 MG PO TABS
ORAL_TABLET | ORAL | Status: AC
  Administered 2015-11-01: 50 mg
  Filled 2015-11-01: qty 1

## 2015-11-01 MED ORDER — KETOROLAC TROMETHAMINE 30 MG/ML IJ SOLN
30.0000 mg | Freq: Once | INTRAMUSCULAR | Status: AC
  Administered 2015-11-01: 30 mg via INTRAVENOUS
  Filled 2015-11-01: qty 1

## 2015-11-01 MED ORDER — IOHEXOL 300 MG/ML  SOLN
100.0000 mL | Freq: Once | INTRAMUSCULAR | Status: AC | PRN
  Administered 2015-11-01: 100 mL via INTRAVENOUS

## 2015-11-01 MED ORDER — TRAMADOL HCL 50 MG PO TABS: 50.0000 mg | ORAL_TABLET | Freq: Four times a day (QID) | ORAL | Status: DC | PRN
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
## 2015-11-01 NOTE — ED Provider Notes (Signed)
-----------------------------------------   10:54 AM on 11/01/2015 -----------------------------------------  Patient has been seen and evaluated by psychiatry. They believe that the patient is safe for discharge home. I reviewed the patient's medical workup, CT so been negative. Labs show an elevated alcohol level as well as positive urine toxicology screen. We will discharge the patient on a short course of Ultram, have him follow up with [facility]. Patient agreeable to plan.  Minna Antis, MD 11/01/15 1055

## 2015-11-01 NOTE — Discharge Instructions (Signed)
Polysubstance Abuse When people abuse more than one drug or type of drug it is called polysubstance or polydrug abuse. For example, many smokers also drink alcohol. This is one form of polydrug abuse. Polydrug abuse also refers to the use of a drug to counteract an unpleasant effect produced by another drug. It may also be used to help with withdrawal from another drug. People who take stimulants may become agitated. Sometimes this agitation is countered with a tranquilizer. This helps protect against the unpleasant side effects. Polydrug abuse also refers to the use of different drugs at the same time.  Anytime drug use is interfering with normal living activities, it has become abuse. This includes problems with family and friends. Psychological dependence has developed when your mind tells you that the drug is needed. This is usually followed by physical dependence which has developed when continuing increases of drug are required to get the same feeling or "high". This is known as addiction or chemical dependency. A person's risk is much higher if there is a history of chemical dependency in the family. SIGNS OF CHEMICAL DEPENDENCY  You have been told by friends or family that drugs have become a problem.  You fight when using drugs.  You are having blackouts (not remembering what you do while using).  You feel sick from using drugs but continue using.  You lie about use or amounts of drugs (chemicals) used.  You need chemicals to get you going.  You are suffering in work performance or in school because of drug use.  You get sick from use of drugs but continue to use anyway.  You need drugs to relate to people or feel comfortable in social situations.  You use drugs to forget problems. "Yes" answered to any of the above signs of chemical dependency indicates there are problems. The longer the use of drugs continues, the greater the problems will become. If there is a family history of  drug or alcohol use, it is best not to experiment with these drugs. Continual use leads to tolerance. After tolerance develops more of the drug is needed to get the same feeling. This is followed by addiction. With addiction, drugs become the most important part of life. It becomes more important to take drugs than participate in the other usual activities of life. This includes relating to friends and family. Addiction is followed by dependency. Dependency is a condition where drugs are now needed not just to get high, but to feel normal. Addiction cannot be cured but it can be stopped. This often requires outside help and the care of professionals. Treatment centers are listed in the yellow pages under: Cocaine, Narcotics, and Alcoholics Anonymous. Most hospitals and clinics can refer you to a specialized care center. Talk to your caregiver if you need help.   This information is not intended to replace advice given to you by your health care provider. Make sure you discuss any questions you have with your health care provider.   Document Released: 05/02/2005 Document Revised: 12/03/2011 Document Reviewed: 09/15/2014 Elsevier Interactive Patient Education 2016 [employer]  Musculoskeletal Pain Musculoskeletal pain is muscle and boney aches and pains. These pains can occur in any part of the body. Your caregiver may treat you without knowing the cause of the pain. They may treat you if blood or urine tests, X-rays, and other tests were normal.  CAUSES There is often not a definite cause or reason for these pains. These pains may be caused by  a type of germ (virus). The discomfort may also come from overuse. Overuse includes working out too hard when your body is not fit. Boney aches also come from weather changes. Bone is sensitive to atmospheric pressure changes. HOME CARE INSTRUCTIONS   Ask when your test results will be ready. Make sure you get your test results.  Only take over-the-counter or  prescription medicines for pain, discomfort, or fever as directed by your caregiver. If you were given medications for your condition, do not drive, operate machinery or power tools, or sign legal documents for 24 hours. Do not drink alcohol. Do not take sleeping pills or other medications that may interfere with treatment.  Continue all activities unless the activities cause more pain. When the pain lessens, slowly resume normal activities. Gradually increase the intensity and duration of the activities or exercise.  During periods of severe pain, bed rest may be helpful. Lay or sit in any position that is comfortable.  Putting ice on the injured area.  Put ice in a bag.  Place a towel between your skin and the bag.  Leave the ice on for 15 to 20 minutes, 3 to 4 times a day.  Follow up with your caregiver for continued problems and no reason can be found for the pain. If the pain becomes worse or does not go away, it may be necessary to repeat tests or do additional testing. Your caregiver may need to look further for a possible cause. SEEK IMMEDIATE MEDICAL CARE IF:  You have pain that is getting worse and is not relieved by medications.  You develop chest pain that is associated with shortness or breath, sweating, feeling sick to your stomach (nauseous), or throw up (vomit).  Your pain becomes localized to the abdomen.  You develop any new symptoms that seem different or that concern you. MAKE SURE YOU:   Understand these instructions.  Will watch your condition.  Will get help right away if you are not doing well or get worse.   This information is not intended to replace advice given to you by your health care provider. Make sure you discuss any questions you have with your health care provider.   Document Released: 09/10/2005 Document Revised: 12/03/2011 Document Reviewed: 05/15/2013 Elsevier Interactive Patient Education [employer]
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
## 2015-11-01 NOTE — BHH Counselor (Signed)
TTS was unable to complete the assessment at this time due to patient not being able to be aroused to complete assessment.

## 2015-11-03 ENCOUNTER — Emergency Department
Admission: EM | Admit: 2015-11-03 | Discharge: 2015-11-04 | Disposition: A | Discharge status: Other Acute Inpatient Hospital | Payer: Self-pay | Attending: Emergency Medicine | Admitting: Emergency Medicine

## 2015-11-03 ENCOUNTER — Emergency Department: Payer: Self-pay

## 2015-11-03 DIAGNOSIS — R45851 Suicidal ideations: Secondary | ICD-10-CM | POA: Insufficient documentation

## 2015-11-03 DIAGNOSIS — Y998 Other external cause status: Secondary | ICD-10-CM | POA: Insufficient documentation

## 2015-11-03 DIAGNOSIS — Y9389 Activity, other specified: Secondary | ICD-10-CM | POA: Insufficient documentation

## 2015-11-03 DIAGNOSIS — W228XXA Striking against or struck by other objects, initial encounter: Secondary | ICD-10-CM | POA: Insufficient documentation

## 2015-11-03 DIAGNOSIS — F1590 Other stimulant use, unspecified, uncomplicated: Secondary | ICD-10-CM | POA: Diagnosis present

## 2015-11-03 DIAGNOSIS — Y9289 Other specified places as the place of occurrence of the external cause: Secondary | ICD-10-CM | POA: Insufficient documentation

## 2015-11-03 DIAGNOSIS — F1994 Other psychoactive substance use, unspecified with psychoactive substance-induced mood disorder: Secondary | ICD-10-CM

## 2015-11-03 DIAGNOSIS — I10 Essential (primary) hypertension: Secondary | ICD-10-CM | POA: Insufficient documentation

## 2015-11-03 DIAGNOSIS — F1310 Sedative, hypnotic or anxiolytic abuse, uncomplicated: Secondary | ICD-10-CM | POA: Insufficient documentation

## 2015-11-03 DIAGNOSIS — F10129 Alcohol abuse with intoxication, unspecified: Secondary | ICD-10-CM | POA: Insufficient documentation

## 2015-11-03 DIAGNOSIS — F1020 Alcohol dependence, uncomplicated: Secondary | ICD-10-CM | POA: Diagnosis present

## 2015-11-03 DIAGNOSIS — F10929 Alcohol use, unspecified with intoxication, unspecified: Secondary | ICD-10-CM

## 2015-11-03 DIAGNOSIS — F1410 Cocaine abuse, uncomplicated: Secondary | ICD-10-CM | POA: Insufficient documentation

## 2015-11-03 DIAGNOSIS — S0081XA Abrasion of other part of head, initial encounter: Secondary | ICD-10-CM | POA: Insufficient documentation

## 2015-11-03 DIAGNOSIS — S0990XA Unspecified injury of head, initial encounter: Secondary | ICD-10-CM | POA: Insufficient documentation

## 2015-11-03 DIAGNOSIS — F17200 Nicotine dependence, unspecified, uncomplicated: Secondary | ICD-10-CM | POA: Diagnosis present

## 2015-11-03 DIAGNOSIS — R45850 Homicidal ideations: Secondary | ICD-10-CM | POA: Insufficient documentation

## 2015-11-03 LAB — COMPREHENSIVE METABOLIC PANEL
ALT: 35 U/L (ref 17–63)
AST: 46 U/L — ABNORMAL HIGH (ref 15–41)
Albumin: 4.2 g/dL (ref 3.5–5.0)
Alkaline Phosphatase: 83 U/L (ref 38–126)
Anion gap: 7 (ref 5–15)
BUN: 12 mg/dL (ref 6–20)
CHLORIDE: 104 mmol/L (ref 101–111)
CO2: 29 mmol/L (ref 22–32)
CREATININE: 0.81 mg/dL (ref 0.61–1.24)
Calcium: 9.3 mg/dL (ref 8.9–10.3)
Glucose, Bld: 75 mg/dL (ref 65–99)
POTASSIUM: 3.7 mmol/L (ref 3.5–5.1)
Sodium: 140 mmol/L (ref 135–145)
Total Bilirubin: 0.3 mg/dL (ref 0.3–1.2)
Total Protein: 7.7 g/dL (ref 6.5–8.1)

## 2015-11-03 LAB — URINE DRUG SCREEN, QUALITATIVE (ARMC ONLY)
Amphetamines, Ur Screen: NOT DETECTED
Barbiturates, Ur Screen: NOT DETECTED
Benzodiazepine, Ur Scrn: POSITIVE — AB
CANNABINOID 50 NG, UR ~~LOC~~: NOT DETECTED
COCAINE METABOLITE, UR ~~LOC~~: POSITIVE — AB
MDMA (ECSTASY) UR SCREEN: NOT DETECTED
Methadone Scn, Ur: NOT DETECTED
OPIATE, UR SCREEN: NOT DETECTED
PHENCYCLIDINE (PCP) UR S: NOT DETECTED
Tricyclic, Ur Screen: NOT DETECTED

## 2015-11-03 LAB — CBC
HCT: 41.8 % (ref 40.0–52.0)
HEMOGLOBIN: 14.1 g/dL (ref 13.0–18.0)
MCH: 32.9 pg (ref 26.0–34.0)
MCHC: 33.8 g/dL (ref 32.0–36.0)
MCV: 97.3 fL (ref 80.0–100.0)
Platelets: 197 10*3/uL (ref 150–440)
RBC: 4.3 MIL/uL — ABNORMAL LOW (ref 4.40–5.90)
RDW: 13.2 % (ref 11.5–14.5)
WBC: 8.8 10*3/uL (ref 3.8–10.6)

## 2015-11-03 LAB — SALICYLATE LEVEL

## 2015-11-03 LAB — ETHANOL: Alcohol, Ethyl (B): 131 mg/dL — ABNORMAL HIGH (ref <5)

## 2015-11-03 LAB — ACETAMINOPHEN LEVEL

## 2015-11-03 MED ORDER — LORAZEPAM 2 MG/ML IJ SOLN: 0.0000 mg | Freq: Two times a day (BID) | INTRAMUSCULAR | Status: DC

## 2015-11-03 MED ORDER — KETOROLAC TROMETHAMINE 10 MG PO TABS
10.0000 mg | ORAL_TABLET | Freq: Once | ORAL | Status: AC
  Administered 2015-11-03: 10 mg via ORAL
  Filled 2015-11-03: qty 1

## 2015-11-03 MED ORDER — LORAZEPAM 2 MG PO TABS
0.0000 mg | ORAL_TABLET | Freq: Four times a day (QID) | ORAL | Status: DC
Start: 2015-11-03 — End: 2015-11-04

## 2015-11-03 MED ORDER — IBUPROFEN 600 MG PO TABS
600.0000 mg | ORAL_TABLET | Freq: Four times a day (QID) | ORAL | Status: DC | PRN
  Administered 2015-11-03: 600 mg via ORAL

## 2015-11-03 MED ORDER — VITAMIN B-1 100 MG PO TABS
100.0000 mg | ORAL_TABLET | Freq: Every day | ORAL | Status: DC
  Administered 2015-11-03: 100 mg via ORAL
  Filled 2015-11-03: qty 1

## 2015-11-03 MED ORDER — THIAMINE HCL 100 MG/ML IJ SOLN: 100.0000 mg | Freq: Every day | INTRAMUSCULAR | Status: DC

## 2015-11-03 MED ORDER — LORAZEPAM 2 MG/ML IJ SOLN: 0.0000 mg | Freq: Four times a day (QID) | INTRAMUSCULAR | Status: DC

## 2015-11-03 MED ORDER — LORAZEPAM 2 MG PO TABS: 0.0000 mg | ORAL_TABLET | Freq: Two times a day (BID) | ORAL | Status: DC

## 2015-11-03 NOTE — ED Notes (Signed)
Urine cup with instructions provided.

## 2015-11-03 NOTE — ED Notes (Signed)
Patient transported to CT 

## 2015-11-03 NOTE — ED Notes (Signed)
Report received from Ruth B., RN. Pt. Alert and oriented in no distress denies SI, HI, AVH and pain.  Pt. Instructed to come to me with problems or concerns.Will continue to monitor for safety via security cameras and Q 15 minute checks. 
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
## 2015-11-03 NOTE — ED Notes (Signed)
UDS to lab

## 2015-11-03 NOTE — ED Notes (Signed)
Snack and beverage given. 

## 2015-11-03 NOTE — Progress Notes (Deleted)
LCSW called [facility]- they have male beds and requested we fax patient details. LCSW will consult with TTS to see if patient can be IVC and sent to [facility]. LCSW provided 1-1 intensive diversion and motivational interviewing skills to assess the immediate needs of this patient and to determine his willingness to attend residential treatment programs.
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
## 2015-11-03 NOTE — ED Notes (Signed)
Pt states "I am suicidal and I want to hurt other people as well. Jose [name] is the main one". Pt reports SI with plan of "hanging".  Pt fully undressed with clothes removed from room, labelled and placed in [employer] Consulting civil engineer notified.

## 2015-11-03 NOTE — ED Notes (Signed)
Pt is calm and cooperative on admission. His mood is sad and his affect is depressed. CIWA is 3 at this time. Pt endorses passive SI, but also contract for safety. Food and drink provided, and he went straight to sleep after eating. 15 minute checks initiated for safety.

## 2015-11-03 NOTE — ED Notes (Signed)
Patient resting quietly in room. No noted distress or abnormal behaviors noted. Will continue 15 minute checks and observation by security camera for safety. 

## 2015-11-03 NOTE — ED Notes (Signed)
States "I just need some rest".  Resting with eyes closed.  Respirations even and unlabored.  Ativan held due to CIWA 3.Support and encouragement offered.
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
## 2015-11-03 NOTE — ED Notes (Signed)
Pt. Noted in room sleeping;. No complaints or concerns voiced. No distress or abnormal behavior noted. Will continue to monitor with security cameras. Q 15 minute rounds continue. 

## 2015-11-03 NOTE — ED Notes (Signed)
Continues to be isolative to room.  NAD.

## 2015-11-03 NOTE — Progress Notes (Signed)
In consultation with TTS- they will assess and interact with this patient. LCSW not required at this time  [employer] LCSW [phone]
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
## 2015-11-04 NOTE — ED Notes (Signed)
Pt. Noted in room sleeping;. No complaints or concerns voiced. No distress or abnormal behavior noted. Will continue to monitor with security cameras. Q 15 minute rounds continue. 

## 2015-11-04 NOTE — ED Notes (Signed)
Nurse ambulated with Patient to lobby and patient left walking.

## 2015-11-04 NOTE — ED Notes (Signed)
Nurse talked with Patient regarding discharge again and He states " ok I will leave, but I am going to walk in front of car" Nurse did ask if she could call some one for him to pick him up, He said "No", also patient did ask to call "Mellody Dance" at [employer]", but no answer. Nurse encouraged him to stay sober and to try to get help for His addiction, Patient would not agree, Nurse told hIm about [employer] and [facility] and the homeless shelter, and Patient refuses. Patient's clothes given to patient. Nurse showed empathy, and offered list of resources and He refused.

## 2015-11-04 NOTE — Discharge Instructions (Signed)
Alcohol Abuse and Nutrition Alcohol abuse is any pattern of alcohol consumption that harms your health, relationships, or work. Alcohol abuse can affect how your body breaks down and absorbs nutrients from food by causing your liver to work abnormally. Additionally, many people who abuse alcohol do not eat enough carbohydrates, protein, fat, vitamins, and minerals. This can cause poor nutrition (malnutrition) and a lack of nutrients (nutrient deficiencies), which can lead to further complications. Nutrients that are commonly lacking (deficient) among people who abuse alcohol include:  Vitamins.  Vitamin A. This is stored in your liver. It is important for your vision, metabolism, and ability to fight off infections (immunity).  B vitamins. These include vitamins such as folate, thiamin, and niacin. These are important in new cell growth and maintenance.  Vitamin C. This plays an important role in iron absorption, wound healing, and immunity.  Vitamin D. This is produced by your liver, but you can also get vitamin D from food. Vitamin D is necessary for your body to absorb and use calcium.  Minerals.  Calcium. This is important for your bones and your heart and blood vessel (cardiovascular) function.  Iron. This is important for blood, muscle, and nervous system functioning.  Magnesium. This plays an important role in muscle and nerve function, and it helps to control blood sugar and blood pressure.  Zinc. This is important for the normal function of your nervous system and digestive system (gastrointestinal tract). Nutrition is an essential component of therapy for alcohol abuse. Your health care provider or dietitian will work with you to design a plan that can help restore nutrients to your body and prevent potential complications. WHAT IS MY PLAN? Your dietitian may develop a specific diet plan that is based on your condition and any other complications you may have. A diet plan will  commonly include:  A balanced diet.  Grains: 6-8 oz per day.  Vegetables: 2-3 cups per day.  Fruits: 1-2 cups per day.  Meat and other protein: 5-6 oz per day.  Dairy: 2-3 cups per day.  Vitamin and mineral supplements. WHAT DO I NEED TO KNOW ABOUT ALCOHOL AND NUTRITION?  Consume foods that are high in antioxidants, such as grapes, berries, nuts, green tea, and dark green and orange vegetables. This can help to counteract some of the stress that is placed on your liver by consuming alcohol.  Avoid food and drinks that are high in fat and sugar. Foods such as sugared soft drinks, salty snack foods, and candy contain empty calories. This means that they lack important nutrients such as protein, fiber, and vitamins.  Eat frequent meals and snacks. Try to eat 5-6 small meals each day.  Eat a variety of fresh fruits and vegetables each day. This will help you get plenty of water, fiber, and vitamins in your diet.  Drink plenty of water and other clear fluids. Try to drink at least 48-64 oz (1.5-2 L) of water per day.  If you are a vegetarian, eat a variety of protein-rich foods. Pair whole grains with plant-based proteins at meals and snacks to obtain the greatest nutrient benefit from your food. For example, eat rice with beans, put peanut butter on whole-grain toast, or eat oatmeal with sunflower seeds.  Soak beans and whole grains overnight before cooking. This can help your body to absorb the nutrients more easily.  Include foods fortified with vitamins and minerals in your diet. Commonly fortified foods include milk, orange juice, cereal, and bread.  If you  are malnourished, your dietitian may recommend a high-protein, high-calorie diet. This may include:  2,000-3,000 calories (kilocalories) per day.  70-100 grams of protein per day.  Your health care provider may recommend a complete nutritional supplement beverage. This can help to restore calories, protein, and vitamins to  your body. Depending on your condition, you may be advised to consume this instead of or in addition to meals.  Limit your intake of caffeine. Replace drinks like coffee and black tea with decaffeinated coffee and herbal tea.  Eat a variety of foods that are high in omega fatty acids. These include fish, nuts and seeds, and soybeans. These foods may help your liver to recover and may also stabilize your mood.  Certain medicines may cause changes in your appetite, taste, and weight. Work with your health care provider and dietitian to make any adjustments to your medicines and diet plan.  Include other healthy lifestyle choices in your daily routine.  Be physically active.  Get enough sleep.  Spend time doing activities that you enjoy.  If you are unable to take in enough food and calories by mouth, your health care provider may recommend a feeding tube. This is a tube that passes through your nose and throat, directly into your stomach. Nutritional supplement beverages can be given to you through the feeding tube to help you get the nutrients you need.  Take vitamin or mineral supplements as recommended by your health care provider. WHAT FOODS CAN I EAT? Grains Enriched pasta. Enriched rice. Fortified whole-grain bread. Fortified whole-grain cereal. Barley. Brown rice. Quinoa. Erwin. Vegetables All fresh, frozen, and canned vegetables. Spinach. Kale. Artichoke. Carrots. Winter squash and pumpkin. Sweet potatoes. Broccoli. Cabbage. Cucumbers. Tomatoes. Sweet peppers. Green beans. Peas. Corn. Fruits All fresh and frozen fruits. Berries. Grapes. Mango. Papaya. Guava. Cherries. Apples. Bananas. Peaches. Plums. Pineapple. Watermelon. Cantaloupe. Oranges. Avocado. Meats and Other Protein Sources Beef liver. Lean beef. Pork. Fresh and canned chicken. Fresh fish. Oysters. Sardines. Canned tuna. Shrimp. Eggs with yolks. Nuts and seeds. Peanut butter. Beans and lentils. Soybeans.  Tofu. Dairy Whole, low-fat, and nonfat milk. Whole, low-fat, and nonfat yogurt. Cottage cheese. Sour cream. Hard and soft cheeses. Beverages Water. Herbal tea. Decaffeinated coffee. Decaffeinated green tea. 100% fruit juice. 100% vegetable juice. Instant breakfast shakes. Condiments Ketchup. Mayonnaise. Mustard. Salad dressing. Barbecue sauce. Sweets and Desserts Sugar-free ice cream. Sugar-free pudding. Sugar-free gelatin. Fats and Oils Butter. Vegetable oil, flaxseed oil, olive oil, and walnut oil. Other Complete nutrition shakes. Protein bars. Sugar-free gum. The items listed above may not be a complete list of recommended foods or beverages. Contact your dietitian for more options. WHAT FOODS ARE NOT RECOMMENDED? Grains Sugar-sweetened breakfast cereals. Flavored instant oatmeal. Fried breads. Vegetables Breaded or deep-fried vegetables. Fruits Dried fruit with added sugar. Candied fruit. Canned fruit in syrup. Meats and Other Protein Sources Breaded or deep-fried meats. Dairy Flavored milks. Fried cheese curds or fried cheese sticks. Beverages Alcohol. Sugar-sweetened soft drinks. Sugar-sweetened tea. Caffeinated coffee and tea. Condiments Sugar. Honey. Agave nectar. Molasses. Sweets and Desserts Chocolate. Cake. Cookies. Candy. Other Potato chips. Pretzels. Salted nuts. Candied nuts. The items listed above may not be a complete list of foods and beverages to avoid. Contact your dietitian for more information.   This information is not intended to replace advice given to you by your health care provider. Make sure you discuss any questions you have with your health care provider.   Document Released: 07/05/2005 Document Revised: 10/01/2014 Document Reviewed: 04/13/2014 Elsevier Interactive Patient  Education 2016 [employer].  Alcohol Intoxication Alcohol intoxication occurs when you drink enough alcohol that it affects your ability to function. It can be mild or very  severe. Drinking a lot of alcohol in a short time is called binge drinking. This can be very harmful. Drinking alcohol can also be more dangerous if you are taking medicines or other drugs. Some of the effects caused by alcohol may include:  Loss of coordination.  Changes in mood and behavior.  Unclear thinking.  Trouble talking (slurred speech).  Throwing up (vomiting).  Confusion.  Slowed breathing.  Twitching and shaking (seizures).  Loss of consciousness. HOME CARE  Do not drive after drinking alcohol.  Drink enough water and fluids to keep your pee (urine) clear or pale yellow. Avoid caffeine.  Only take medicine as told by your doctor. GET HELP IF:  You throw up (vomit) many times.  You do not feel better after a few days.  You frequently have alcohol intoxication. Your doctor can help decide if you should see a substance use treatment counselor. GET HELP RIGHT AWAY IF:  You become shaky when you stop drinking.  You have twitching and shaking.  You throw up blood. It may look bright red or like coffee grounds.  You notice blood in your poop (bowel movements).  You become lightheaded or pass out (faint). MAKE SURE YOU:   Understand these instructions.  Will watch your condition.  Will get help right away if you are not doing well or get worse.   This information is not intended to replace advice given to you by your health care provider. Make sure you discuss any questions you have with your health care provider.   Document Released: 02/27/2008 Document Revised: 05/13/2013 Document Reviewed: 02/13/2013 Elsevier Interactive Patient Education 2016 [employer] Inc[employer]  Chemical Dependency Chemical dependency is an addiction to drugs or alcohol. It is characterized by the repeated behavior of seeking out and using drugs and alcohol despite harmful consequences to the health and safety of ones self and others.  RISK FACTORS There are certain situations or  behaviors that increase a person's risk for chemical dependency. These include:  A family history of chemical dependency.  A history of mental health issues, including depression and anxiety.  A home environment where drugs and alcohol are easily available to you.  Drug or alcohol use at a young age. SYMPTOMS  The following symptoms can indicate chemical dependency:  Inability to limit the use of drugs or alcohol.  Nausea, sweating, shakiness, and anxiety that occurs when alcohol or drugs are not being used.  An increase in amount of drugs or alcohol that is necessary to get drunk or high. People who experience these symptoms can assess their use of drugs and alcohol by asking themselves the following questions:  Have you been told by friends or family that they are worried about your use of alcohol or drugs?  Do friends and family ever tell you about things you did while drinking alcohol or using drugs that you do not remember?  Do you lie about using alcohol or drugs or about the amounts you use?  Do you have difficulty completing daily tasks unless you use alcohol or drugs?  Is the level of your work or school performance lower because of your drug or alcohol use?  Do you get sick from using drugs or alcohol but keep using anyway?  Do you feel uncomfortable in social situations unless you use alcohol or drugs?  Do you use drugs or alcohol to help forget problems? An answer of yes to any of these questions may indicate chemical dependency. Professional evaluation is suggested.   This information is not intended to replace advice given to you by your health care provider. Make sure you discuss any questions you have with your health care provider.   Document Released: 09/04/2001 Document Revised: 12/03/2011 Document Reviewed: 11/16/2010 Elsevier Interactive Patient Education [employer]

## 2015-11-04 NOTE — ED Notes (Signed)
Patient with discharge orders, states that He is still feeling Si/HI, Nurse did talk with ER doctors regarding His assessment, Dr.ordered for Patient to be discharged that His psych eval. has already been done per Dr. Toni Amend and that He is homeless and Is looking somewhere to stay for warmth and that He has been offered assist to go to rehabs in the past but refuses.ER MD states that He does not meet criteria to be admitted at this time.

## 2015-11-04 NOTE — ED Provider Notes (Signed)
-----------------------------------------   12:19 AM on 11/04/2015 -----------------------------------------  Patient was seen and evaluated by the psychiatrist today. He feels the patient is not a candidate for inpatient psychiatric care and she'll be discharged. Medically speaking the patient was medically cleared long before my arrival tonight. Given the psychiatry feels the patient does not require inpatient psychiatric care, and that is the only reason he has been kept here, we'll discharge him.  Jeanmarie Plant, MD 11/04/15 [phone]

## 2015-11-12 ENCOUNTER — Emergency Department
Admission: EM | Admit: 2015-11-12 | Discharge: 2015-11-14 | Disposition: A | Discharge status: Other Acute Inpatient Hospital | Payer: Self-pay | Attending: Emergency Medicine | Admitting: Emergency Medicine

## 2015-11-12 ENCOUNTER — Emergency Department: Payer: Self-pay

## 2015-11-12 ENCOUNTER — Encounter: Payer: Self-pay | Admitting: Emergency Medicine

## 2015-11-12 DIAGNOSIS — F1410 Cocaine abuse, uncomplicated: Secondary | ICD-10-CM | POA: Insufficient documentation

## 2015-11-12 DIAGNOSIS — Y9289 Other specified places as the place of occurrence of the external cause: Secondary | ICD-10-CM | POA: Insufficient documentation

## 2015-11-12 DIAGNOSIS — F10239 Alcohol dependence with withdrawal, unspecified: Secondary | ICD-10-CM | POA: Diagnosis present

## 2015-11-12 DIAGNOSIS — I10 Essential (primary) hypertension: Secondary | ICD-10-CM | POA: Insufficient documentation

## 2015-11-12 DIAGNOSIS — F1020 Alcohol dependence, uncomplicated: Secondary | ICD-10-CM | POA: Diagnosis present

## 2015-11-12 DIAGNOSIS — F1310 Sedative, hypnotic or anxiolytic abuse, uncomplicated: Secondary | ICD-10-CM | POA: Insufficient documentation

## 2015-11-12 DIAGNOSIS — F1590 Other stimulant use, unspecified, uncomplicated: Secondary | ICD-10-CM | POA: Diagnosis present

## 2015-11-12 DIAGNOSIS — F1320 Sedative, hypnotic or anxiolytic dependence, uncomplicated: Secondary | ICD-10-CM | POA: Diagnosis present

## 2015-11-12 DIAGNOSIS — F10120 Alcohol abuse with intoxication, uncomplicated: Secondary | ICD-10-CM | POA: Insufficient documentation

## 2015-11-12 DIAGNOSIS — B1920 Unspecified viral hepatitis C without hepatic coma: Secondary | ICD-10-CM | POA: Diagnosis present

## 2015-11-12 DIAGNOSIS — Y9389 Activity, other specified: Secondary | ICD-10-CM | POA: Insufficient documentation

## 2015-11-12 DIAGNOSIS — F329 Major depressive disorder, single episode, unspecified: Secondary | ICD-10-CM | POA: Insufficient documentation

## 2015-11-12 DIAGNOSIS — F10920 Alcohol use, unspecified with intoxication, uncomplicated: Secondary | ICD-10-CM

## 2015-11-12 DIAGNOSIS — F17200 Nicotine dependence, unspecified, uncomplicated: Secondary | ICD-10-CM | POA: Diagnosis present

## 2015-11-12 DIAGNOSIS — F1994 Other psychoactive substance use, unspecified with psychoactive substance-induced mood disorder: Secondary | ICD-10-CM | POA: Diagnosis present

## 2015-11-12 DIAGNOSIS — F10939 Alcohol use, unspecified with withdrawal, unspecified: Secondary | ICD-10-CM | POA: Diagnosis present

## 2015-11-12 DIAGNOSIS — S0990XA Unspecified injury of head, initial encounter: Secondary | ICD-10-CM | POA: Insufficient documentation

## 2015-11-12 DIAGNOSIS — R45851 Suicidal ideations: Secondary | ICD-10-CM

## 2015-11-12 DIAGNOSIS — Y998 Other external cause status: Secondary | ICD-10-CM | POA: Insufficient documentation

## 2015-11-12 DIAGNOSIS — Z79899 Other long term (current) drug therapy: Secondary | ICD-10-CM | POA: Insufficient documentation

## 2015-11-12 DIAGNOSIS — R45850 Homicidal ideations: Secondary | ICD-10-CM

## 2015-11-12 DIAGNOSIS — F10220 Alcohol dependence with intoxication, uncomplicated: Secondary | ICD-10-CM | POA: Diagnosis present

## 2015-11-12 LAB — COMPREHENSIVE METABOLIC PANEL
ALT: 44 U/L (ref 17–63)
ANION GAP: 8 (ref 5–15)
AST: 72 U/L — ABNORMAL HIGH (ref 15–41)
Albumin: 4.3 g/dL (ref 3.5–5.0)
Alkaline Phosphatase: 97 U/L (ref 38–126)
BUN: 7 mg/dL (ref 6–20)
CALCIUM: 9.3 mg/dL (ref 8.9–10.3)
CHLORIDE: 104 mmol/L (ref 101–111)
CO2: 28 mmol/L (ref 22–32)
CREATININE: 0.78 mg/dL (ref 0.61–1.24)
Glucose, Bld: 141 mg/dL — ABNORMAL HIGH (ref 65–99)
Potassium: 4 mmol/L (ref 3.5–5.1)
SODIUM: 140 mmol/L (ref 135–145)
Total Bilirubin: 0.3 mg/dL (ref 0.3–1.2)
Total Protein: 8.1 g/dL (ref 6.5–8.1)

## 2015-11-12 LAB — ACETAMINOPHEN LEVEL

## 2015-11-12 LAB — CBC
HCT: 46.5 % (ref 40.0–52.0)
Hemoglobin: 15.8 g/dL (ref 13.0–18.0)
MCH: 33.3 pg (ref 26.0–34.0)
MCHC: 33.9 g/dL (ref 32.0–36.0)
MCV: 98.2 fL (ref 80.0–100.0)
PLATELETS: 133 10*3/uL — AB (ref 150–440)
RBC: 4.73 MIL/uL (ref 4.40–5.90)
RDW: 13.5 % (ref 11.5–14.5)
WBC: 6.7 10*3/uL (ref 3.8–10.6)

## 2015-11-12 LAB — URINE DRUG SCREEN, QUALITATIVE (ARMC ONLY)
AMPHETAMINES, UR SCREEN: NOT DETECTED
Barbiturates, Ur Screen: NOT DETECTED
Benzodiazepine, Ur Scrn: POSITIVE — AB
Cannabinoid 50 Ng, Ur ~~LOC~~: NOT DETECTED
Cocaine Metabolite,Ur ~~LOC~~: POSITIVE — AB
MDMA (ECSTASY) UR SCREEN: NOT DETECTED
METHADONE SCREEN, URINE: NOT DETECTED
Opiate, Ur Screen: NOT DETECTED
Phencyclidine (PCP) Ur S: NOT DETECTED
TRICYCLIC, UR SCREEN: NOT DETECTED

## 2015-11-12 LAB — ETHANOL: ALCOHOL ETHYL (B): 290 mg/dL — AB (ref <5)

## 2015-11-12 LAB — SALICYLATE LEVEL
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
## 2015-11-13 MED ORDER — LORAZEPAM 2 MG PO TABS
0.0000 mg | ORAL_TABLET | Freq: Four times a day (QID) | ORAL | Status: DC
Start: 2015-11-13 — End: 2015-11-14
  Administered 2015-11-13: 4 mg via ORAL
  Filled 2015-11-13: qty 2

## 2015-11-13 MED ORDER — LORAZEPAM 2 MG/ML IJ SOLN: 0.0000 mg | Freq: Four times a day (QID) | INTRAMUSCULAR | Status: DC

## 2015-11-13 MED ORDER — IBUPROFEN 800 MG PO TABS
800.0000 mg | ORAL_TABLET | Freq: Once | ORAL | Status: AC
  Administered 2015-11-13: 800 mg via ORAL
  Filled 2015-11-13: qty 1

## 2015-11-13 MED ORDER — THIAMINE HCL 100 MG/ML IJ SOLN: 100.0000 mg | Freq: Every day | INTRAMUSCULAR | Status: DC

## 2015-11-13 MED ORDER — LORAZEPAM 2 MG/ML IJ SOLN: 0.0000 mg | Freq: Two times a day (BID) | INTRAMUSCULAR | Status: DC

## 2015-11-13 MED ORDER — VITAMIN B-1 100 MG PO TABS
100.0000 mg | ORAL_TABLET | Freq: Every day | ORAL | Status: DC
  Administered 2015-11-13 – 2015-11-14 (×2): 100 mg via ORAL
  Filled 2015-11-13 (×2): qty 1

## 2015-11-13 MED ORDER — LORAZEPAM 2 MG PO TABS
0.0000 mg | ORAL_TABLET | Freq: Two times a day (BID) | ORAL | Status: DC
Start: 2015-11-13 — End: 2015-11-14
  Administered 2015-11-14: 1 mg via ORAL

## 2015-11-13 NOTE — ED Notes (Signed)
Patient asleep in room. No noted distress or abnormal behavior. Will continue 15 minute checks and observation by security cameras for safety. 
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
## 2015-11-13 NOTE — ED Notes (Signed)

## 2015-11-13 NOTE — ED Notes (Signed)
ED BHU PLACEMENT JUSTIFICATION Is the patient under IVC or is there intent for IVC: No. Is the patient medically cleared: Yes.   Is there vacancy in the ED BHU: Yes.   Is the population mix appropriate for patient: Yes.   Is the patient awaiting placement in inpatient or outpatient setting: Yes.   Has the patient had a psychiatric consult: No. Survey of unit performed for contraband, proper placement and condition of furniture, tampering with fixtures in bathroom, shower, and each patient room: Yes.  ; Findings:  APPEARANCE/BEHAVIOR calm, cooperative and adequate rapport can be established NEURO ASSESSMENT Orientation: time, place and person Hallucinations: No.None noted (Hallucinations) Speech: Normal Gait: normal RESPIRATORY ASSESSMENT Normal expansion.  Clear to auscultation.  No rales, rhonchi, or wheezing. CARDIOVASCULAR ASSESSMENT regular rate and rhythm, S1, S2 normal, no murmur, click, rub or gallop GASTROINTESTINAL ASSESSMENT soft, nontender, BS WNL, no r/g EXTREMITIES normal strength, tone, and muscle mass PLAN OF CARE Provide calm/safe environment. Vital signs assessed twice daily. ED BHU Assessment once each 12-hour shift. Collaborate with intake RN daily or as condition indicates. Assure the ED provider has rounded once each shift. Provide and encourage hygiene. Provide redirection as needed. Assess for escalating behavior; address immediately and inform ED provider.  Assess family dynamic and appropriateness for visitation as needed: Yes.  ; If necessary, describe findings:  Educate the patient/family about BHU procedures/visitation: Yes.  ; If necessary, describe findings:  

## 2015-11-13 NOTE — ED Notes (Signed)
Report from dawn, rn.  

## 2015-11-13 NOTE — ED Notes (Signed)
BEHAVIORAL HEALTH ROUNDING Patient sleeping: No. Patient alert and oriented: not applicable Behavior appropriate: Yes.  ; If no, describe:  Nutrition and fluids offered: No Toileting and hygiene offered: No Sitter present: no Law enforcement present: Yes  
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
## 2015-11-13 NOTE — ED Notes (Signed)
BEHAVIORAL HEALTH ROUNDING Patient sleeping: Yes.   Patient alert and oriented: no Behavior appropriate: Yes.  ; If no, describe:  Nutrition and fluids offered: No Toileting and hygiene offered: No Sitter present: not applicable Law enforcement present: Yes  

## 2015-11-13 NOTE — ED Notes (Signed)
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
## 2015-11-13 NOTE — ED Notes (Signed)
Meal given. No s/s ETOH withdrawal. Maintained on 15 minute checks and observation by security camera for safety.

## 2015-11-14 ENCOUNTER — Inpatient Hospital Stay
Admission: EM | Admit: 2015-11-14 | Discharge: 2015-11-15 | DRG: 897 | Disposition: A | Discharge status: Home / Self Care | Payer: No Typology Code available for payment source | Source: Intra-hospital | Attending: Psychiatry | Admitting: Psychiatry

## 2015-11-14 ENCOUNTER — Encounter: Payer: Self-pay | Admitting: *Deleted

## 2015-11-14 ENCOUNTER — Encounter: Payer: Self-pay | Admitting: Psychiatry

## 2015-11-14 DIAGNOSIS — F10220 Alcohol dependence with intoxication, uncomplicated: Secondary | ICD-10-CM | POA: Diagnosis present

## 2015-11-14 DIAGNOSIS — F17200 Nicotine dependence, unspecified, uncomplicated: Secondary | ICD-10-CM | POA: Diagnosis present

## 2015-11-14 DIAGNOSIS — B182 Chronic viral hepatitis C: Secondary | ICD-10-CM | POA: Diagnosis present

## 2015-11-14 DIAGNOSIS — F10239 Alcohol dependence with withdrawal, unspecified: Secondary | ICD-10-CM | POA: Diagnosis present

## 2015-11-14 DIAGNOSIS — I10 Essential (primary) hypertension: Secondary | ICD-10-CM | POA: Diagnosis present

## 2015-11-14 DIAGNOSIS — F1320 Sedative, hypnotic or anxiolytic dependence, uncomplicated: Secondary | ICD-10-CM | POA: Diagnosis present

## 2015-11-14 DIAGNOSIS — F1024 Alcohol dependence with alcohol-induced mood disorder: Principal | ICD-10-CM | POA: Diagnosis present

## 2015-11-14 DIAGNOSIS — F10939 Alcohol use, unspecified with withdrawal, unspecified: Secondary | ICD-10-CM | POA: Diagnosis present

## 2015-11-14 DIAGNOSIS — F1020 Alcohol dependence, uncomplicated: Secondary | ICD-10-CM | POA: Diagnosis present

## 2015-11-14 DIAGNOSIS — F1994 Other psychoactive substance use, unspecified with psychoactive substance-induced mood disorder: Secondary | ICD-10-CM | POA: Diagnosis present

## 2015-11-14 DIAGNOSIS — Z590 Homelessness: Secondary | ICD-10-CM

## 2015-11-14 DIAGNOSIS — R45851 Suicidal ideations: Secondary | ICD-10-CM | POA: Diagnosis present

## 2015-11-14 DIAGNOSIS — F329 Major depressive disorder, single episode, unspecified: Secondary | ICD-10-CM | POA: Diagnosis present

## 2015-11-14 DIAGNOSIS — F3289 Other specified depressive episodes: Secondary | ICD-10-CM

## 2015-11-14 DIAGNOSIS — F1590 Other stimulant use, unspecified, uncomplicated: Secondary | ICD-10-CM | POA: Diagnosis present

## 2015-11-14 DIAGNOSIS — B1920 Unspecified viral hepatitis C without hepatic coma: Secondary | ICD-10-CM | POA: Diagnosis present

## 2015-11-14 LAB — TSH: TSH: 3.357 u[IU]/mL (ref 0.350–4.500)

## 2015-11-14 MED ORDER — ACETAMINOPHEN 325 MG PO TABS
650.0000 mg | ORAL_TABLET | Freq: Four times a day (QID) | ORAL | Status: DC | PRN
  Administered 2015-11-14: 650 mg via ORAL
  Filled 2015-11-14: qty 2

## 2015-11-14 MED ORDER — PANTOPRAZOLE SODIUM 40 MG PO TBEC
40.0000 mg | DELAYED_RELEASE_TABLET | Freq: Every day | ORAL | Status: DC
  Administered 2015-11-14 – 2015-11-15 (×2): 40 mg via ORAL
  Filled 2015-11-14 (×2): qty 1

## 2015-11-14 MED ORDER — TRAZODONE HCL 100 MG PO TABS
100.0000 mg | ORAL_TABLET | Freq: Every day | ORAL | Status: DC
  Administered 2015-11-14: 100 mg via ORAL
  Filled 2015-11-14: qty 1

## 2015-11-14 MED ORDER — LORAZEPAM 1 MG PO TABS
1.0000 mg | ORAL_TABLET | Freq: Three times a day (TID) | ORAL | Status: DC
  Administered 2015-11-14: 1 mg via ORAL
  Filled 2015-11-14: qty 1

## 2015-11-14 MED ORDER — IBUPROFEN 600 MG PO TABS
600.0000 mg | ORAL_TABLET | ORAL | Status: DC | PRN
  Administered 2015-11-14: 600 mg via ORAL
  Filled 2015-11-14: qty 1

## 2015-11-14 MED ORDER — MAGNESIUM HYDROXIDE 400 MG/5ML PO SUSP: 30.0000 mL | Freq: Every day | ORAL | Status: DC | PRN

## 2015-11-14 MED ORDER — CHLORDIAZEPOXIDE HCL 25 MG PO CAPS
25.0000 mg | ORAL_CAPSULE | Freq: Four times a day (QID) | ORAL | Status: DC
  Administered 2015-11-14 – 2015-11-15 (×3): 25 mg via ORAL
  Filled 2015-11-14 (×3): qty 1

## 2015-11-14 MED ORDER — ALUM & MAG HYDROXIDE-SIMETH 200-200-20 MG/5ML PO SUSP: 30.0000 mL | ORAL | Status: DC | PRN

## 2015-11-14 MED ORDER — NICOTINE 21 MG/24HR TD PT24: 21.0000 mg | MEDICATED_PATCH | Freq: Every day | TRANSDERMAL | Status: DC

## 2015-11-14 MED ORDER — LORAZEPAM 1 MG PO TABS
ORAL_TABLET | ORAL | Status: AC
  Filled 2015-11-14: qty 1

## 2015-11-14 NOTE — ED Notes (Signed)
BEHAVIORAL HEALTH ROUNDING Patient sleeping: Yes.   Patient alert and oriented: no Behavior appropriate: Yes.  ; If no, describe:  Nutrition and fluids offered: No Toileting and hygiene offered: No Sitter present: not applicable Law enforcement present: Yes  

## 2015-11-14 NOTE — Tx Team (Signed)
Initial Interdisciplinary Treatment Plan   PATIENT STRESSORS: Substance abuse homeless   PATIENT STRENGTHS: Average or above average intelligence Communication skills   PROBLEM LIST: Problem List/Patient Goals Date to be addressed Date deferred Reason deferred Estimated date of resolution  homeless                                                       DISCHARGE CRITERIA:  Improved stabilization in mood, thinking, and/or behavior  PRELIMINARY DISCHARGE PLAN: Outpatient therapy  PATIENT/FAMIILY INVOLVEMENT: This treatment plan has been presented to and reviewed with the patient, Jose [name], and/or family member, .  The patient and family have been given the opportunity to ask questions and make suggestions.  Leonia Reader B 11/14/2015, 5:48 PM

## 2015-11-14 NOTE — H&P (Signed)
Psychiatric Admission Assessment Adult  Patient Identification: Jose [name] MRN:  [id number] Date of Evaluation:  11/14/2015 Chief Complaint:  Depression Principal Diagnosis: Substance induced mood disorder (HCC) Diagnosis:   Patient Active Problem List   Diagnosis Date Noted  . Sedative, hypnotic or anxiolytic use disorder, severe, dependence (HCC) [F13.20] 11/14/2015  . Substance induced mood disorder (HCC) [F19.94] 11/03/2015  . Alcohol use disorder, severe, dependence (HCC) [F10.20] 10/24/2015  . Alcohol withdrawal (HCC) [F10.239] 10/24/2015  . Stimulant use disorder (HCC) (cocaine) [F15.90] 10/24/2015  . Tobacco use disorder [F17.200] 10/24/2015  . Hepatitis C [B19.20] 09/06/2015   History of Present Illness:  Identifying data. Jose [name] is a 54-year-old male with history of alcoholism.  Chief complaint. "I am fine now."  History of present illness. Information was obtained from the patient and the chart. Jose [name] has a long history of drinking. He has been in our emergency room several times in the past couple of months always drank with high blood alcohol level. Upon his arrival in the emergency room 2 days ago and while drunk he made some suicidal threats. Today he feels better and has only passing thoughts of suicide. He denies symptoms of depression but points out to his difficult social situation. He's [place] with no income or support. He adamantly denies any symptoms of depression, anxiety, psychosis or symptoms suggestive of bipolar mania and is not interested in any pharmacotherapy. He denies other than alcohol substance use and is not interested in substance abuse treatment. She was started on CIWA protocol the emergency room. He seems to tolerate the detox well with minimal symptoms of withdrawal. Vital signs are stable.  Past psychiatric history. He was hospitalized here couple weeks ago in a similar scenario. He denies ever attempting  suicide. He denies ever being treated for mood and anxiety or psychosis. There are no substance abuse treatments either.  Family psychiatric history. Brother with alcoholism.  Social history. He is currently homeless. He has no income or insurance.  Total Time spent with patient: 1 hour  Past Psychiatric History:  Alcoholism.  Is the patient at risk to self? Yes.    Has the patient been a risk to self in the past 6 months? Yes.    Has the patient been a risk to self within the distant past? No.  Is the patient a risk to others? No.  Has the patient been a risk to others in the past 6 months? No.  Has the patient been a risk to others within the distant past? No.   Prior Inpatient Therapy:   Prior Outpatient Therapy:    Alcohol Screening:   Substance Abuse History in the last 12 months:  Yes.   Consequences of Substance Abuse: Negative Previous Psychotropic Medications: No  Psychological Evaluations: No  Past Medical History:  Past Medical History  Diagnosis Date  . Hypertension   . Hep C w/o coma, chronic (HCC)   . Polysubstance abuse    No past surgical history on file. Family History: No family history on file. Family Psychiatric  History:  Brother with alcoholism. Tobacco Screening: @FLOW([id number])::1)@ Social History:  History  Alcohol Use  . 36.0 oz/week  . 60 Cans of beer per week    Comment: 12 pack per day     History  Drug Use  . Yes  . Special: Cocaine    Comment: last use cocaine 10/31/15    Additional Social History:                             Allergies:  No Known Allergies Lab Results:  Results for orders placed or performed during the hospital encounter of 11/12/15 (from the past 48 hour(s))  Comprehensive metabolic panel     Status: Abnormal   Collection Time: 11/12/15 10:54 PM  Result Value Ref Range   Sodium 140 135 - 145 mmol/L   Potassium 4.0 3.5 - 5.1 mmol/L   Chloride 104 101 - 111 mmol/L   CO2 28 22 - 32 mmol/L   Glucose,  Bld 141 (H) 65 - 99 mg/dL   BUN 7 6 - 20 mg/dL   Creatinine, Ser 0.78 0.61 - 1.24 mg/dL   Calcium 9.3 8.9 - 10.3 mg/dL   Total Protein 8.1 6.5 - 8.1 g/dL   Albumin 4.3 3.5 - 5.0 g/dL   AST 72 (H) 15 - 41 U/L   ALT 44 17 - 63 U/L   Alkaline Phosphatase 97 38 - 126 U/L   Total Bilirubin 0.3 0.3 - 1.2 mg/dL   GFR calc non Af Amer >60 >60 mL/min   GFR calc Af Amer >60 >60 mL/min    Comment: (NOTE) The eGFR has been calculated using the CKD EPI equation. This calculation has not been validated in all clinical situations. eGFR's persistently <60 mL/min signify possible Chronic Kidney Disease.    Anion gap 8 5 - 15  Ethanol (ETOH)     Status: Abnormal   Collection Time: 11/12/15 10:54 PM  Result Value Ref Range   Alcohol, Ethyl (B) 290 (H) <5 mg/dL    Comment:        LOWEST DETECTABLE LIMIT FOR SERUM ALCOHOL IS 5 mg/dL FOR MEDICAL PURPOSES ONLY   Salicylate level     Status: None   Collection Time: 11/12/15 10:54 PM  Result Value Ref Range   Salicylate Lvl <4.0 2.8 - 30.0 mg/dL  Acetaminophen level     Status: Abnormal   Collection Time: 11/12/15 10:54 PM  Result Value Ref Range   Acetaminophen (Tylenol), Serum <10 (L) 10 - 30 ug/mL    Comment:        THERAPEUTIC CONCENTRATIONS VARY SIGNIFICANTLY. A RANGE OF 10-30 ug/mL MAY BE AN EFFECTIVE CONCENTRATION FOR MANY PATIENTS. HOWEVER, SOME ARE BEST TREATED AT CONCENTRATIONS OUTSIDE THIS RANGE. ACETAMINOPHEN CONCENTRATIONS >150 ug/mL AT 4 HOURS AFTER INGESTION AND >50 ug/mL AT 12 HOURS AFTER INGESTION ARE OFTEN ASSOCIATED WITH TOXIC REACTIONS.   CBC     Status: Abnormal   Collection Time: 11/12/15 10:54 PM  Result Value Ref Range   WBC 6.7 3.8 - 10.6 K/uL   RBC 4.73 4.40 - 5.90 MIL/uL   Hemoglobin 15.8 13.0 - 18.0 g/dL   HCT 46.5 40.0 - 52.0 %   MCV 98.2 80.0 - 100.0 fL   MCH 33.3 26.0 - 34.0 pg   MCHC 33.9 32.0 - 36.0 g/dL   RDW 13.5 11.5 - 14.5 %   Platelets 133 (L) 150 - 440 K/uL  Urine Drug Screen, Qualitative  ([facility] only)     Status: Abnormal   Collection Time: 11/12/15 10:54 PM  Result Value Ref Range   Tricyclic, Ur Screen NONE DETECTED NONE DETECTED   Amphetamines, Ur Screen NONE DETECTED NONE DETECTED   MDMA (Ecstasy)Ur Screen NONE DETECTED NONE DETECTED   Cocaine Metabolite,Ur [place] POSITIVE (A) NONE DETECTED   Opiate, Ur Screen NONE DETECTED NONE DETECTED   Phencyclidine (PCP) Ur S NONE DETECTED NONE DETECTED   Cannabinoid 50 Ng, Ur [place] NONE DETECTED NONE DETECTED   Barbiturates, Ur Screen NONE DETECTED   NONE DETECTED   Benzodiazepine, Ur Scrn POSITIVE (A) NONE DETECTED   Methadone Scn, Ur NONE DETECTED NONE DETECTED    Comment: (NOTE) 811  Tricyclics, urine               Cutoff 1000 ng/mL 200  Amphetamines, urine             Cutoff 1000 ng/mL 300  MDMA (Ecstasy), urine           Cutoff 500 ng/mL 400  Cocaine Metabolite, urine       Cutoff 300 ng/mL 500  Opiate, urine                   Cutoff 300 ng/mL 600  Phencyclidine (PCP), urine      Cutoff 25 ng/mL 700  Cannabinoid, urine              Cutoff 50 ng/mL 800  Barbiturates, urine             Cutoff 200 ng/mL 900  Benzodiazepine, urine           Cutoff 200 ng/mL 1000 Methadone, urine                Cutoff 300 ng/mL 1100 1200 The urine drug screen provides only a preliminary, unconfirmed 1300 analytical test result and should not be used for non-medical 1400 purposes. Clinical consideration and professional judgment should 1500 be applied to any positive drug screen result due to possible 1600 interfering substances. A more specific alternate chemical method 1700 must be used in order to obtain a confirmed analytical result.  1800 Gas chromato graphy / mass spectrometry (GC/MS) is the preferred 1900 confirmatory method.     Blood Alcohol level:  Lab Results  Component Value Date   ETH 290* 11/12/2015   ETH 131* 57/26/2035    Metabolic Disorder Labs:  No results found for: HGBA1C, MPG No results found for: PROLACTIN No results  found for: CHOL, TRIG, HDL, CHOLHDL, VLDL, LDLCALC  Current Medications: Current Facility-Administered Medications  Medication Dose Route Frequency Provider Last Rate Last Dose  . alum & mag hydroxide-simeth (MAALOX/MYLANTA) 200-200-20 MG/5ML suspension 30 mL  30 mL Oral Q4H PRN Rainey Pines, MD      . magnesium hydroxide (MILK OF MAGNESIA) suspension 30 mL  30 mL Oral Daily PRN Rainey Pines, MD       PTA Medications: No prescriptions prior to admission    Musculoskeletal: Strength & Muscle Tone: within normal limits Gait & Station: normal Patient leans: N/A  Psychiatric Specialty Exam: Physical Exam  Nursing note and vitals reviewed. Constitutional: He is oriented to person, place, and time. He appears well-developed.  HENT:  Head: Normocephalic and atraumatic.  Eyes: Conjunctivae and EOM are normal. Pupils are equal, round, and reactive to light.  Neck: Normal range of motion. Neck supple.  Cardiovascular: Normal rate, regular rhythm and normal heart sounds.   Respiratory: Effort normal and breath sounds normal.  GI: Soft. Bowel sounds are normal.  Musculoskeletal: Normal range of motion.  Neurological: He is alert and oriented to person, place, and time.  Skin: Skin is warm and dry.    Review of Systems  Psychiatric/Behavioral: Positive for depression, suicidal ideas and substance abuse.  All other systems reviewed and are negative.   Blood pressure 159/92, pulse 91, temperature 97.9 F (36.6 C), temperature source Oral, resp. rate 20, height 5' 8" (1.727 m), weight 63.504 kg (140 lb), SpO2 100 %.Body mass index is 21.29 kg/(m^2).  See SRA.                                                  Sleep:        Treatment Plan Summary: Daily contact with patient to assess and evaluate symptoms and progress in treatment and Medication management   Jose [name] is a 55 year old male with a history of substance abuse admitting for worsening of depression and  suicidal ideation in the context of severe social stressors and substance use.  1. Suicidal ideation. The patient still claims to be suicidal. He is able to contract for safety in the hospital.  2. Alcohol detox. He has been in the emergency room for 2 days on CIWA protocol. At this point we will offer brief Librium taper.  3. Substance abuse. He is not interested in residential treatment.  4. Disposition. He will be discharged to home. He will follow up with [facility].  Observation Level/Precautions:  Detox 15 minute checks  Laboratory:  CBC Chemistry Profile UDS UA  Psychotherapy:    Medications:    Consultations:    Discharge Concerns:    Estimated LOS:  Other:     I certify that inpatient services furnished can reasonably be expected to improve the patient's condition.    Orson Slick, MD 2/20/20174:58 PM And

## 2015-11-14 NOTE — BH Assessment (Signed)
Patient spoke [facility], Peer Support [name]) and he want to get into "[facility]" for Substance Abuse Treatment. Patient was provided the phone to call them, but the admission person was in a meeting. Patient was advised to call back after 11:30am.  Per Psych MD (Dr. Garnetta Buddy), patient will be admitted to Behavioral Medicine.

## 2015-11-14 NOTE — ED Notes (Signed)
Patient discharged to Behavioral medicine. Patient alert and oriented, all belongings given to patient.

## 2015-11-14 NOTE — Progress Notes (Signed)
55 year old male patient admitted with alcohol intoxication.  Presents to unit in scrubs with notable body odor.  Affect sad.  Denies any SI.  Also states "I'll be ready to go home tomorrow."   Body search and skin assessment performed.  No contraband found.  Skin warm and dry to touch.  No broken areas detected.  Tattoo noted to left arm.

## 2015-11-14 NOTE — BH Assessment (Signed)
Patient is to be admitted to [facility] by Dr. Garnetta Buddy.  Attending Physician will be Dr. Jennet Maduro.   Patient has been assigned to room 306-A, by [facility] Charge Nurse [place].   Intake Paper Work has been signed and placed on patient chart.  ER staff is aware of the admission Misty Stanley, ER Sect.; Dr. Dr. Manson Passey, ER MD; Toniann Fail. Patient's Nurse & Byrd Hesselbach, Patient Access).

## 2015-11-14 NOTE — ED Provider Notes (Signed)
Patient evaluated by Dr. Garnetta Buddy with plan for hospital admission for further management  Darci Current, MD 11/14/15 1144

## 2015-11-14 NOTE — BHH Suicide Risk Assessment (Signed)
[facility] Admission Suicide Risk Assessment   Nursing information obtained from:    Demographic factors:    Current Mental Status:    Loss Factors:    Historical Factors:    Risk Reduction Factors:     Total Time spent with patient: 1 hour Principal Problem: Substance induced mood disorder (HCC) Diagnosis:   Patient Active Problem List   Diagnosis Date Noted  . Sedative, hypnotic or anxiolytic use disorder, severe, dependence (HCC) [F13.20] 11/14/2015  . Substance induced mood disorder (HCC) [F19.94] 11/03/2015  . Alcohol use disorder, severe, dependence (HCC) [F10.20] 10/24/2015  . Alcohol withdrawal (HCC) [F10.239] 10/24/2015  . Stimulant use disorder (HCC) (cocaine) [F15.90] 10/24/2015  . Tobacco use disorder [F17.200] 10/24/2015  . Hepatitis C [B19.20] 09/06/2015   Subjective Data: Depression, suicidal ideation, substance use.  Continued Clinical Symptoms:    The "Alcohol Use Disorders Identification Test", Guidelines for Use in Primary Care, Second Edition.  World Science writer [facility]). Score between 0-7:  no or low risk or alcohol related problems. Score between 8-15:  moderate risk of alcohol related problems. Score between 16-19:  high risk of alcohol related problems. Score 20 or above:  warrants further diagnostic evaluation for alcohol dependence and treatment.   CLINICAL FACTORS:   Depression:   Comorbid alcohol abuse/dependence Alcohol/Substance Abuse/Dependencies   Musculoskeletal: Strength & Muscle Tone: within normal limits Gait & Station: normal Patient leans: N/A  Psychiatric Specialty Exam: Review of Systems  Psychiatric/Behavioral: Positive for depression, suicidal ideas and substance abuse. The patient has insomnia.   All other systems reviewed and are negative.   Blood pressure 159/92, pulse 91, temperature 97.9 F (36.6 C), temperature source Oral, resp. rate 20, height  (1.727 m), weight 63.504 kg (140 lb), SpO2 100 %.Body mass index is  21.29 kg/(m^2).  General Appearance: Fairly Groomed  Patent attorney::  Fair  Speech:  Clear and Coherent  Volume:  Normal  Mood:  Depressed  Affect:  Blunt  Thought Process:  Goal Directed  Orientation:  Full (Time, Place, and Person)  Thought Content:  WDL  Suicidal Thoughts:  Yes.  with intent/plan  Homicidal Thoughts:  No  Memory:  Immediate;   Fair Recent;   Fair Remote;   Fair  Judgement:  Poor  Insight:  Lacking  Psychomotor Activity:  Normal  Concentration:  Fair  Recall:  [employer] of Knowledge:Fair  Language: Fair  Akathisia:  No  Handed:  Right  AIMS (if indicated):     Assets:  Communication Skills Desire for Improvement Physical Health Resilience  Sleep:     Cognition: WNL  ADL's:  Intact    COGNITIVE FEATURES THAT CONTRIBUTE TO RISK:  None    SUICIDE RISK:   Minimal: No identifiable suicidal ideation.  Patients presenting with no risk factors but with morbid ruminations; may be classified as minimal risk based on the severity of the depressive symptoms  PLAN OF CARE: Hospital admission, medication management, alcohol detox, substance abuse counseling, discharge planning.  Jose [name] is a 55 year old male with a history of substance abuse admitting for worsening of depression and suicidal ideation in the context of severe social stressors and substance use.  1. Suicidal ideation. The patient still claims to be suicidal. He is able to contract for safety in the hospital.  2. Alcohol detox. He has been in the emergency room for 2 days on CIWA protocol. At this point we will offer brief Librium taper.  3. Substance abuse. He is not interested in residential  treatment.  4. Disposition. He will be discharged to home. He will follow up with [facility].  I certify that inpatient services furnished can reasonably be expected to improve the patient's condition.   Kristine Linea, MD 11/14/2015, 4:54 PM

## 2015-11-14 NOTE — ED Notes (Signed)
0845, patient is alert and oriented, shakey and easily agitated, sensitive to light, Patient is on ciwa, and had score of 8, patient states " i just need to sleep, He states " I do feel like if I leave " I might hurt myself" patient is homeless and feels and states that He feels hopeless, patient has had opportunities to go for help with addiction, but has refused. q 15 min checks and camera monitoring.
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
## 2015-11-15 DIAGNOSIS — F17200 Nicotine dependence, unspecified, uncomplicated: Secondary | ICD-10-CM | POA: Insufficient documentation

## 2015-11-15 DIAGNOSIS — R079 Chest pain, unspecified: Secondary | ICD-10-CM | POA: Insufficient documentation

## 2015-11-15 DIAGNOSIS — I10 Essential (primary) hypertension: Secondary | ICD-10-CM | POA: Insufficient documentation

## 2015-11-15 NOTE — Plan of Care (Signed)
Problem: Ineffective individual coping Goal: STG: Patient will remain free from self harm Outcome: Progressing Patient has remained free from harm since admission.      

## 2015-11-15 NOTE — Progress Notes (Signed)
Pleasant and cooperative. Denies SI/HI/AVH and depression. Discharge instructions given, verbalized understanding.  Personal belongings returned.  Escorted off unit by staff member to main entrance to travel home.

## 2015-11-15 NOTE — Tx Team (Signed)
Interdisciplinary Treatment Plan Update (Adult)        Date: 11/15/2015   Time Reviewed: 9:30 AM   Progress in Treatment: Improving  Attending groups: No  Participating in groups: No  Taking medication as prescribed: Yes  Tolerating medication: Yes  Family/Significant other contact made: No, pt refused Patient understands diagnosis: Yes  Discussing patient identified problems/goals with staff: Yes  Medical problems stabilized or resolved: Yes  Denies suicidal/homicidal ideation: Yes  Issues/concerns per patient self-inventory: Yes  Other:   New problem(s) identified: N/A   Discharge Plan or Barriers: Pt refused referrals for therapy and medication management but did sign consent for [facility] and may walk in after completion of residential treamtent at [facility], but reports he plans to apply for admission at [facility] in [place] for inpatient substance abuse treatment but wants to discharge to get his clothes before he calls.  Reason for Continuation of Hospitalization:   Depression   Anxiety   Medication Stabilization   Comments: N/A   Estimated date of discharge: 11/15/2015   Patient was recently discharged with plan to go to [facility] for substance abuse treatment and is back and wants to discharge and follow up with his plan to go to [facility] but will go get his belongings before he calls as patient requests.    Review of initial/current patient goals per problem list:  1. Goal(s): Patient will participate in aftercare plan   Met: Adequate for discharge per MD.  Target date: by discharge  As evidenced by: Patient will participate within aftercare plan AEB aftercare provider and housing plan at discharge being identified.   11/15/15: Adequate for discharge per MD.  Pt refused referrals for therapy and medication management but signed consent for [facility] and may go if he does not go to residential treatment or at the  completion of residential treatment, and reports he plans to apply for admission at [facility] in [place] for inpatient substance abuse treatment. Goal Met    2. Goal (s): Patient will exhibit decreased depressive symptoms and suicidal ideations.   Met: Adequate for discharge per MD.  Target date: by discharge  As evidenced by: Patient will utilize self-rating of depression at 3 or below and demonstrate decreased signs of depression or be deemed stable for discharge by MD.   11/15/15: Adequate for discharge per MD.  Pt denies SI/HI.  Pt reports he is safe for discharge. Goal met.      3. Goal(s): Patient will demonstrate decreased signs and symptoms of anxiety.   Met: Adequate for discharge per MD.  Target date: by discharge  As evidenced by: Patient will utilize self-rating of anxiety at 3 or below and demonstrated decreased signs of anxiety, or be deemed stable for discharge by MD   11/15/15: Adequate for discharge per MD.  Pt reports baseline symptoms for anxiety. Goal met   4. Goal(s): Patient will demonstrate decreased signs of withdrawal due to substance abuse   Met: Adequate for discharge per MD  Target date: by discharge  As evidenced by: Patient will produce a CIWA/COWS score of 0, have stable vitals signs, and no symptoms of withdrawal   11/15/15: Per medical chart  patient produced a CIWA/COWS score of 1 on 11/14/15 but stable for discharge per MD today 11/15/15, has stable vitals signs, and no symptoms of withdrawal. Goal met.      Physician:Jolanta Pucilowska , MD    11/15/2015 9:30 AM  Nursing:Phyllis  Belenda Cruise, RN                         11/15/2015 9:30 AM  Clinical Social Worker: Carmell [place], [place]  11/15/2015 9:30 AM  Nursing:                           11/15/2015 9:30 AM  Other:         11/15/2015 9:30 AM  Other:        11/15/2015 9:30 AM  Other:        11/15/2015 9:30 AM     Carmell [place], MSW, LCSWA  [phone] 11/15/2015  9:53AM

## 2015-11-15 NOTE — Progress Notes (Signed)
  [facility] Adult Case Management Discharge Plan :  Will you be returning to the same living situation after discharge:  Yes,  back to his tent to gather belongings and will call [name] to get into resiedential Tx per patient request At discharge, do you have transportation home?: Yes,  patient will walk as he requested and is staying in a tent near the hospital Do you have the ability to pay for your medications: No. Patient does not plan to take medications at after discharge but is given [facility] info if he decides to seek outpatient services  Release of information consent forms completed and in the chart;  Patient's signature needed at discharge.  Patient to Follow up at: Follow-up Information    Go to [facility].   Why:  Patient does not plan to follow up outpatient but wants to go to [facility] for substance abuse residential treatment. Patient can walk in at [facility] any MWF 8-3 if he decides to not go to residential treatment or after completion of Tx   Contact information:   [address] [place], [place] [address] Ph [phone] Fax [phone] [name] [phone]      Call [name].   Why:  Patient wants to discharge and make his own arrangemnts at [facility] for residential treament for substance abuse   Contact information:   [address] [place], [place] [address] [name] [phone] [name] (Director) [phone]       Next level of care provider has access to [facility] Link:no  Safety Planning and Suicide Prevention discussed: Yes,  SPE discussed with patient but patient refused any family contact.  Have you used any form of tobacco in the last 30 days? (Cigarettes, Smokeless Tobacco, Cigars, and/or Pipes): Yes  Has patient been referred to the Quitline?: Patient refused referral  Patient has been referred for addiction treatment: Pt. refused referral, plans to call [name] in [place] on his own  terms.  [name], MSW, LCSWA 11/15/2015, 12:12 PM

## 2015-11-15 NOTE — Progress Notes (Signed)
D: Patient appears sad and depressed. He shaved and washed his clothes this evening. He denies SI/HI/AVH. He reported having a HA.  A: Medication given with education. Encouragement provided. PRN Tylenol given for HA.  R: Patient compliant with medication. He has remained calm and cooperative. Safety maintained with 15 min checks.
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
## 2015-11-15 NOTE — [facility]HH Suicide Risk Assessment (Signed)
[facility] Discharge Suicide Risk Assessment   Principal Problem: Substance induced mood disorder [facility]) Discharge Diagnoses:  Patient Active Problem List   Diagnosis Date Noted  . Sedative, hypnotic or anxiolytic use disorder, severe, dependence (HCC) [F13.20] 11/14/2015  . 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 Substance induced mood disorder (HCC) [F19.94] 11/03/2015  . Alcohol use disorder, severe, dependence (HCC) [F10.20] 10/24/2015  . Alcohol withdrawal (HCC) [F10.239] 10/24/2015  . Stimulant use disorder (HCC) (cocaine) [F15.90] 10/24/2015  . Tobacco use disorder [F17.200] 10/24/2015  . Hepatitis C [B19.20] 09/06/2015    Total Time spent with patient: 30 minutes  Musculoskeletal: Strength & Muscle Tone: within normal limits Gait & Station: normal Patient leans: N/A  Psychiatric Specialty Exam: Review of Systems  All other systems reviewed and are negative.   Blood pressure 129/77, pulse 89, temperature 98.2 F (36.8 C), temperature source Oral, resp. rate 18, height  (1.727 m), weight 63.504 kg (140 lb), SpO2 100 %.Body mass index is 21.29 kg/(m^2).  General Appearance: Fairly Groomed  Patent attorney::  Fair  Speech:  Clear and Coherent409  Volume:  Normal  Mood:  Euthymic  Affect:  Appropriate  Thought Process:  Goal Directed  Orientation:  Full (Time, Place, and Person)  Thought Content:  WDL  Suicidal Thoughts:  No  Homicidal Thoughts:  No  Memory:  Immediate;   Fair Recent;   Fair Remote;   Fair  Judgement:  Impaired  Insight:  Shallow  Psychomotor Activity:  Normal  Concentration:  Fair  Recall:  [employer] of Knowledge:Fair  Language: Fair  Akathisia:  No  Handed:  Right  AIMS (if indicated):     Assets:  Communication Skills Desire for Improvement Resilience  Sleep:  Number of Hours: 7.75  Cognition: WNL  ADL's:  Intact   Mental Status Per Nursing Assessment::   On Admission:      Demographic Factors:  Male, Caucasian, Low socioeconomic status and Unemployed  Loss Factors: Financial problems/change in socioeconomic status  Historical Factors: Impulsivity  Risk Reduction Factors:   Sense of responsibility to family  Continued Clinical Symptoms:  Depression:   Impulsivity Alcohol/Substance Abuse/Dependencies  Cognitive Features That Contribute To Risk:  None    Suicide Risk:  Minimal: No identifiable suicidal ideation.  Patients presenting with no risk factors but with morbid ruminations; may be classified as minimal risk based on the severity of the depressive symptoms    Plan Of Care/Follow-up recommendations:  Activity:  As tolerated. Diet:  Low sodium heart healthy. Other:  Keep follow-up appointments.  Kristine Linea, MD 11/15/2015, 8:58 AM

## 2015-11-15 NOTE — BHH Counselor (Signed)
Patient is discharged in less than 24 hours of admission, no PSA required.

## 2015-11-15 NOTE — Discharge Summary (Signed)
Physician Discharge Summary Note  Patient:  ELEX MAINWARING is an 55 y.o., male MRN:  [id number] DOB:  March 05, 1961 Patient phone:  [phone] (home)  Patient address:   [place] [address],  Total Time spent with patient: 30 minutes  Date of Admission:  11/14/2015 Date of Discharge: 11/15/2015  Reason for Admission:  Suicidal ideation and alcohol detox.  Identifying data. Mr. Ishler is a 55 year old male with history of alcoholism.  Chief complaint. "I am fine now."  History of present illness. Information was obtained from the patient and the chart. Mr. Shankles has a long history of drinking. He has been in our emergency room several times in the past couple of months always drank with high blood alcohol level. Upon his arrival in the emergency room 2 days ago and while drunk he made some suicidal threats. Today he feels better and has only passing thoughts of suicide. He denies symptoms of depression but points out to his difficult social situation. He's been homeless living in the woods with no income or support. He adamantly denies any symptoms of depression, anxiety, psychosis or symptoms suggestive of bipolar mania and is not interested in any pharmacotherapy. He denies other than alcohol substance use and is not interested in substance abuse treatment. She was started on CIWA protocol the emergency room. He seems to tolerate the detox well with minimal symptoms of withdrawal. Vital signs are stable.  Past psychiatric history. He was hospitalized here couple weeks ago in a similar scenario. He denies ever attempting suicide. He denies ever being treated for mood and anxiety or psychosis. There are no substance abuse treatments either.  Family psychiatric history. Brother with alcoholism.  Social history. He is currently homeless. He has no income or insurance.  Principal Problem: Substance induced mood disorder [facility]) Discharge Diagnoses: Patient Active Problem List    Diagnosis Date Noted  . Sedative, hypnotic or anxiolytic use disorder, severe, dependence (HCC) [F13.20] 11/14/2015  . 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 Substance induced mood disorder (HCC) [F19.94] 11/03/2015  . Alcohol use disorder, severe, dependence (HCC) [F10.20] 10/24/2015  . Alcohol withdrawal (HCC) [F10.239] 10/24/2015  . Stimulant use disorder (HCC) (cocaine) [F15.90] 10/24/2015  . Tobacco use disorder [F17.200] 10/24/2015  . Hepatitis C [B19.20] 09/06/2015    Past Psychiatric History: Substance abuse.  Past Medical History:  Past Medical History  Diagnosis Date  . Hypertension   . Hep C w/o coma, chronic (HCC)   . Polysubstance abuse    History reviewed. No pertinent past surgical history. Family History: History reviewed. No pertinent family history. Family Psychiatric  History: Alcoholism. Social History:  History  Alcohol Use  . 36.0 oz/week  . 60 Cans of beer per week    Comment: 12 pack per day     History  Drug Use  . Yes  . Special: Cocaine    Comment: last use cocaine 10/31/15    Social History   Social History  . Marital Status: Divorced    Spouse Name: N/A  . Number of Children: N/A  . Years of Education: N/A   Social History Main Topics  . Smoking status: Current Every Day Smoker -- 0.50 packs/day for 45 years  . Smokeless tobacco: Never Used  . Alcohol Use: 36.0 oz/week    60 Cans of beer per week     Comment: 12 pack per day  . Drug Use: Yes    Special: Cocaine  Comment: last use cocaine 10/31/15  . Sexual Activity: No   Other Topics Concern  . None   Social History Narrative    Hospital Course:    Mr. Wolin is a 55 year old male with a history of substance abuse admitting for worsening of depression and suicidal ideation in the context of severe social stressors and substance use.  1. Suicidal ideation. This has resolved. The patient is able to contract  for safety.  2. Alcohol detox. He completed detox protocol. This was uncomplicated detox. Vital signs were stable.   3. Substance abuse. He declined residential treatment.  4. Smoking. Nicotine patch was available.   5. Disposition. He was discharged to home. He will follow up with [facility].  Physical Findings: AIMS: Facial and Oral Movements Muscles of Facial Expression: None, normal Lips and Perioral Area: None, normal Jaw: None, normal Tongue: None, normal,Extremity Movements Upper (arms, wrists, hands, fingers): None, normal Lower (legs, knees, ankles, toes): None, normal, Trunk Movements Neck, shoulders, hips: None, normal, Overall Severity Severity of abnormal movements (highest score from questions above): None, normal Incapacitation due to abnormal movements: None, normal Patient's awareness of abnormal movements (rate only patient's report): No Awareness, Dental Status Current problems with teeth and/or dentures?: Yes Does patient usually wear dentures?: No  CIWA:    COWS:     Musculoskeletal: Strength & Muscle Tone: within normal limits Gait & Station: normal Patient leans: N/A  Psychiatric Specialty Exam: Review of Systems  All other systems reviewed and are negative.   Blood pressure 129/77, pulse 89, temperature 98.2 F (36.8 C), temperature source Oral, resp. rate 18, height  (1.727 m), weight 63.504 kg (140 lb), SpO2 100 %.Body mass index is 21.29 kg/(m^2).  See SRA.                                                  Sleep:  Number of Hours: 7.75   Have you used any form of tobacco in the last 30 days? (Cigarettes, Smokeless Tobacco, Cigars, and/or Pipes): Yes  Has this patient used any form of tobacco in the last 30 days? (Cigarettes, Smokeless Tobacco, Cigars, and/or Pipes) Yes, Yes, A prescription for an FDA-approved tobacco cessation medication was offered at discharge and the patient refused  Blood Alcohol level:  Lab Results   Component Value Date   ETH 290* 11/12/2015   ETH 131* 11/03/2015    Metabolic Disorder Labs:  No results found for: HGBA1C, MPG No results found for: PROLACTIN No results found for: CHOL, TRIG, HDL, CHOLHDL, VLDL, LDLCALC  See Psychiatric Specialty Exam and Suicide Risk Assessment completed by Attending Physician prior to discharge.  Discharge destination:  Home  Is patient on multiple antipsychotic therapies at discharge:  No   Has Patient had three or more failed trials of antipsychotic monotherapy by history:  No  Recommended Plan for Multiple Antipsychotic Therapies: NA  Discharge Instructions    Diet - low sodium heart healthy    Complete by:  As directed      Increase activity slowly    Complete by:  As directed             Medication List    Notice    You have not been prescribed any medications.       Follow-up recommendations:  Activity:  As tolerated. Diet:  Low sodium heart  healthy. Other:  Keep follow-up appointments.  Comments:    Signed: Kristine Linea, MD 11/15/2015, 9:01 AM

## 2015-11-15 NOTE — [facility] Group Notes (Signed)
[facility] Group Notes:  (Nursing/MHT/Case Management/Adjunct)  Date:  11/15/2015  Time:  2:06 AM  Type of Therapy:  Group Therapy  Participation Level:  Active  Participation Quality:  Appropriate  Affect:  Appropriate  Cognitive:  Appropriate  Insight:  Appropriate  Engagement in Group:  Engaged  Modes of Intervention:  Discussion  Summary of Progress/Problems:  Jose [name] 11/15/2015, 2:06 AM

## 2015-11-15 NOTE — [facility] Suicide Risk Assessment (Signed)
[facility] INPATIENT:  Family/Significant Other Suicide Prevention Education  Suicide Prevention Education:  Patient Refusal for Family/Significant Other Suicide Prevention Education: The patient Jose [name] has refused to provide written consent for family/significant other to be provided Family/Significant Other Suicide Prevention Education during admission and/or prior to discharge.  Physician notified.  [name], MSW, LCSWA 11/15/2015, 9:50 AM

## 2015-11-16 ENCOUNTER — Emergency Department
Admission: EM | Admit: 2015-11-16 | Discharge: 2015-11-16 | Disposition: A | Discharge status: Home / Self Care | Payer: Self-pay | Attending: Student | Admitting: Student

## 2015-11-17 ENCOUNTER — Emergency Department
Admission: EM | Admit: 2015-11-17 | Discharge: 2015-11-17 | Disposition: A | Discharge status: Other Acute Inpatient Hospital | Payer: Self-pay | Attending: Emergency Medicine | Admitting: Emergency Medicine

## 2015-11-17 ENCOUNTER — Encounter: Payer: Self-pay | Admitting: Emergency Medicine

## 2015-11-17 DIAGNOSIS — F329 Major depressive disorder, single episode, unspecified: Secondary | ICD-10-CM | POA: Insufficient documentation

## 2015-11-17 DIAGNOSIS — F1590 Other stimulant use, unspecified, uncomplicated: Secondary | ICD-10-CM | POA: Diagnosis present

## 2015-11-17 DIAGNOSIS — I10 Essential (primary) hypertension: Secondary | ICD-10-CM | POA: Insufficient documentation

## 2015-11-17 DIAGNOSIS — F10220 Alcohol dependence with intoxication, uncomplicated: Secondary | ICD-10-CM | POA: Insufficient documentation

## 2015-11-17 DIAGNOSIS — B1920 Unspecified viral hepatitis C without hepatic coma: Secondary | ICD-10-CM | POA: Diagnosis present

## 2015-11-17 DIAGNOSIS — F1020 Alcohol dependence, uncomplicated: Secondary | ICD-10-CM | POA: Diagnosis present

## 2015-11-17 DIAGNOSIS — F1414 Cocaine abuse with cocaine-induced mood disorder: Secondary | ICD-10-CM | POA: Insufficient documentation

## 2015-11-17 DIAGNOSIS — F1994 Other psychoactive substance use, unspecified with psychoactive substance-induced mood disorder: Secondary | ICD-10-CM | POA: Diagnosis present

## 2015-11-17 DIAGNOSIS — R45851 Suicidal ideations: Secondary | ICD-10-CM | POA: Insufficient documentation

## 2015-11-17 DIAGNOSIS — F1310 Sedative, hypnotic or anxiolytic abuse, uncomplicated: Secondary | ICD-10-CM | POA: Insufficient documentation

## 2015-11-17 DIAGNOSIS — F32A Depression, unspecified: Secondary | ICD-10-CM

## 2015-11-17 DIAGNOSIS — F17200 Nicotine dependence, unspecified, uncomplicated: Secondary | ICD-10-CM | POA: Insufficient documentation

## 2015-11-17 LAB — COMPREHENSIVE METABOLIC PANEL
ALK PHOS: 81 U/L (ref 38–126)
ALT: 38 U/L (ref 17–63)
ANION GAP: 10 (ref 5–15)
AST: 59 U/L — ABNORMAL HIGH (ref 15–41)
Albumin: 4.1 g/dL (ref 3.5–5.0)
BUN: 12 mg/dL (ref 6–20)
CO2: 23 mmol/L (ref 22–32)
Calcium: 9.1 mg/dL (ref 8.9–10.3)
Chloride: 108 mmol/L (ref 101–111)
Creatinine, Ser: 0.82 mg/dL (ref 0.61–1.24)
GLUCOSE: 146 mg/dL — AB (ref 65–99)
Potassium: 3.8 mmol/L (ref 3.5–5.1)
Sodium: 141 mmol/L (ref 135–145)
TOTAL PROTEIN: 7.5 g/dL (ref 6.5–8.1)
Total Bilirubin: 0.2 mg/dL — ABNORMAL LOW (ref 0.3–1.2)

## 2015-11-17 LAB — ACETAMINOPHEN LEVEL

## 2015-11-17 LAB — SALICYLATE LEVEL: Salicylate Lvl: <4 mg/dL (ref 2.8–30.0)

## 2015-11-17 LAB — CBC
HCT: 41.2 % (ref 40.0–52.0)
HEMOGLOBIN: 14 g/dL (ref 13.0–18.0)
MCH: 32.7 pg (ref 26.0–34.0)
MCHC: 34.1 g/dL (ref 32.0–36.0)
MCV: 96.1 fL (ref 80.0–100.0)
Platelets: 103 10*3/uL — ABNORMAL LOW (ref 150–440)
RBC: 4.29 MIL/uL — AB (ref 4.40–5.90)
RDW: 13.3 % (ref 11.5–14.5)
WBC: 6.7 10*3/uL (ref 3.8–10.6)

## 2015-11-17 LAB — URINE DRUG SCREEN, QUALITATIVE (ARMC ONLY)
AMPHETAMINES, UR SCREEN: NOT DETECTED
Barbiturates, Ur Screen: NOT DETECTED
Benzodiazepine, Ur Scrn: POSITIVE — AB
COCAINE METABOLITE, UR ~~LOC~~: POSITIVE — AB
Cannabinoid 50 Ng, Ur ~~LOC~~: NOT DETECTED
MDMA (ECSTASY) UR SCREEN: NOT DETECTED
METHADONE SCREEN, URINE: NOT DETECTED
Opiate, Ur Screen: NOT DETECTED
Phencyclidine (PCP) Ur S: NOT DETECTED
TRICYCLIC, UR SCREEN: NOT DETECTED

## 2015-11-17 LAB — ETHANOL: ALCOHOL ETHYL (B): 293 mg/dL — AB (ref <5)

## 2015-11-17 MED ORDER — LORAZEPAM 2 MG/ML IJ SOLN
0.0000 mg | Freq: Four times a day (QID) | INTRAMUSCULAR | Status: DC
Start: 2015-11-17 — End: 2015-11-17
  Administered 2015-11-17: 1 mg via INTRAVENOUS
  Filled 2015-11-17: qty 2

## 2015-11-17 MED ORDER — VITAMIN B-1 100 MG PO TABS
100.0000 mg | ORAL_TABLET | Freq: Every day | ORAL | Status: DC
  Administered 2015-11-17: 100 mg via ORAL
  Filled 2015-11-17: qty 1

## 2015-11-17 MED ORDER — SODIUM CHLORIDE 0.9 % IV BOLUS (SEPSIS)
1000.0000 mL | Freq: Once | INTRAVENOUS | Status: AC
  Administered 2015-11-17: 1000 mL via INTRAVENOUS

## 2015-11-17 NOTE — ED Notes (Signed)
Patient resting quietly in room. No noted distress or abnormal behaviors noted. Will continue 15 minute checks and observation by security camera for safety. Patient states he is considering rehab for help with his addiction.

## 2015-11-17 NOTE — ED Notes (Signed)
Patient asleep in room. No noted distress or abnormal behavior. Will continue 15 minute checks and observation by security cameras for safety. 

## 2015-11-17 NOTE — ED Notes (Signed)
IVC rescinded/Consult completed/ Pending D/C

## 2015-11-17 NOTE — Consult Note (Signed)
[facility] Face-to-Face Psychiatry Consult   Reason for Consult:  Consult for this 55 year old man with a history of alcohol abuse who was discharged from the hospital about 24 hours prior to coming back. Referring Physician:  Cinda Quest Patient Identification: EMMERT ROETHLER MRN:  [id number] Principal Diagnosis: Substance induced mood disorder [facility]) Diagnosis:   Patient Active Problem List   Diagnosis Date Noted  . Sedative, hypnotic or anxiolytic use disorder, severe, dependence ([place]) [F13.20] 11/14/2015  . Alcohol-induced depressive disorder with moderate or severe use disorder ([place]) [F10.24, F32.89] 11/14/2015  . Substance induced mood disorder ([place]) [F19.94] 11/03/2015  . Alcohol use disorder, severe, dependence ([place]) [F10.20] 10/24/2015  . Alcohol withdrawal ([place]) [F10.239] 10/24/2015  . Stimulant use disorder ([employer]) (cocaine) [F15.90] 10/24/2015  . Tobacco use disorder [F17.200] 10/24/2015  . Hepatitis C [B19.20] 09/06/2015    Total Time spent with patient: 45 minutes  Subjective:   Jose [name] is a 55 y.o. male patient admitted with "I'm in bad shape, I'm drinking again".  HPI:  Patient interviewed. Chart reviewed. Patient seen multiple times previously in the emergency room. Labs reviewed. 55 year old man with a history of alcohol abuse comes into the emergency room intoxicated with a blood alcohol level of almost 300. He indicates that after he left the hospital last time he phoned the place that he wanted to go stay and they hadis available so he went back out in the woods and has been drinking again. So has been consuming cocaine again. Patient says that he is "suicidal" but does not have any plan or specific intent to kill himself. Mood is feeling bad and he is also complaining of a headache currently. Patient was just discharged from the hospital on the 21st and at that time he had been offered substance abuse treatment and had refused it. Today I offered him the possibility of going to  the alcohol and drug abuse treatment Center in [place] and he refused that as well. Patient has a history of multiple refusals of appropriate treatment options. Not taking medication.  Social history: Patient lives out in the woods in a tent. Pretty much estranged from his family. Very minimal social contact except for people he drinks with.  Medical history: Patient has hepatitis C. No history of seizures or delirium tremens. No other active medical problems requiring medication.  Substance abuse history: Long history of alcohol abuse. Also cocaine abuse. Tends to be noncompliant with or refuse offered substance abuse treatment. Minimal sobriety especially since breaking up with his wife. As mentioned previously no history of delirium tremens or seizures.  Past Psychiatric History: Patient has a history of reporting depressive symptoms when he first presents intoxicated but typically clears up entirely within a day or so. Doesn't cooperate with inpatient treatment. He has no history of action trying to kill himself in the past. No history of psychosis. No history of compliance with her response to antidepressant treatment.  Risk to Self: Suicidal Ideation: Yes-Currently Present Suicidal Intent: No Is patient at risk for suicide?: No Suicidal Plan?: No Access to Means: No Specify Access to Suicidal Means: None What has been your use of drugs/alcohol within the last 12 months?: ETOH and cocaine How many times?: 2 Other Self Harm Risks: Chronic alcoholism Triggers for Past Attempts: None known Intentional Self Injurious Behavior: None Risk to Others: Homicidal Ideation: No Thoughts of Harm to Others: No Current Homicidal Intent: No Current Homicidal Plan: No Access to Homicidal Means: No Describe Access to Homicidal Means: N/A Identified  Victim: N/A History of harm to others?: No Assessment of Violence: None Noted Violent Behavior Description: None reported Does patient have access to  weapons?: No Criminal Charges Pending?: No Describe Pending Criminal Charges: None reported Does patient have a court date: No Prior Inpatient Therapy: Prior Inpatient Therapy: Yes Prior Therapy Dates: 11/14/2015 Prior Therapy Facilty/Provider(s): [facility] Reason for Treatment: Substance Use Prior Outpatient Therapy: Prior Outpatient Therapy: No Prior Therapy Dates: n/a Prior Therapy Facilty/Provider(s): n/a Reason for Treatment: n/a  Past Medical History:  Past Medical History  Diagnosis Date  . Hypertension   . Hep C w/o coma, chronic ([place])   . Polysubstance abuse    History reviewed. No pertinent past surgical history. Family History: History reviewed. No pertinent family history. Family Psychiatric  History: Patient does report a family history of alcohol abuse Social History:  History  Alcohol Use  . 36.0 oz/week  . 60 Cans of beer per week    Comment: 12 pack per day     History  Drug Use  . Yes  . Special: Cocaine    Comment: last use cocaine 10/31/15    Social History   Social History  . Marital Status: Divorced    Spouse Name: N/A  . Number of Children: N/A  . Years of Education: N/A   Social History Main Topics  . Smoking status: Current Every Day Smoker -- 0.50 packs/day for 45 years  . Smokeless tobacco: Never Used  . Alcohol Use: 36.0 oz/week    60 Cans of beer per week     Comment: 12 pack per day  . Drug Use: Yes    Special: Cocaine     Comment: last use cocaine 10/31/15  . Sexual Activity: No   Other Topics Concern  . None   Social History Narrative   Additional Social History:    Allergies:  No Known Allergies  Labs:  Results for orders placed or performed during the hospital encounter of 11/17/15 (from the past 48 hour(s))  Urine Drug Screen, Qualitative ([place] only)     Status: Abnormal   Collection Time: 11/17/15  1:46 AM  Result Value Ref Range   Tricyclic, Ur Screen NONE DETECTED NONE DETECTED   Amphetamines, Ur Screen NONE  DETECTED NONE DETECTED   MDMA (Ecstasy)Ur Screen NONE DETECTED NONE DETECTED   Cocaine Metabolite,Ur [place] POSITIVE (A) NONE DETECTED   Opiate, Ur Screen NONE DETECTED NONE DETECTED   Phencyclidine (PCP) Ur S NONE DETECTED NONE DETECTED   Cannabinoid 50 Ng, Ur [place] NONE DETECTED NONE DETECTED   Barbiturates, Ur Screen NONE DETECTED NONE DETECTED   Benzodiazepine, Ur Scrn POSITIVE (A) NONE DETECTED   Methadone Scn, Ur NONE DETECTED NONE DETECTED    Comment: (NOTE) 932  Tricyclics, urine               Cutoff 1000 ng/mL 200  Amphetamines, urine             Cutoff 1000 ng/mL 300  MDMA (Ecstasy), urine           Cutoff 500 ng/mL 400  Cocaine Metabolite, urine       Cutoff 300 ng/mL 500  Opiate, urine                   Cutoff 300 ng/mL 600  Phencyclidine (PCP), urine      Cutoff 25 ng/mL 700  Cannabinoid, urine              Cutoff 50 ng/mL 800  Barbiturates, urine             Cutoff 200 ng/mL 900  Benzodiazepine, urine           Cutoff 200 ng/mL 1000 Methadone, urine                Cutoff 300 ng/mL 1100 1200 The urine drug screen provides only a preliminary, unconfirmed 1300 analytical test result and should not be used for non-medical 1400 purposes. Clinical consideration and professional judgment should 1500 be applied to any positive drug screen result due to possible 1600 interfering substances. A more specific alternate chemical method 1700 must be used in order to obtain a confirmed analytical result.  1800 Gas chromato graphy / mass spectrometry (GC/MS) is the preferred 1900 confirmatory method.   Comprehensive metabolic panel     Status: Abnormal   Collection Time: 11/17/15  1:47 AM  Result Value Ref Range   Sodium 141 135 - 145 mmol/L   Potassium 3.8 3.5 - 5.1 mmol/L   Chloride 108 101 - 111 mmol/L   CO2 23 22 - 32 mmol/L   Glucose, Bld 146 (H) 65 - 99 mg/dL   BUN 12 6 - 20 mg/dL   Creatinine, Ser 0.82 0.61 - 1.24 mg/dL   Calcium 9.1 8.9 - 10.3 mg/dL   Total Protein 7.5 6.5 -  8.1 g/dL   Albumin 4.1 3.5 - 5.0 g/dL   AST 59 (H) 15 - 41 U/L   ALT 38 17 - 63 U/L   Alkaline Phosphatase 81 38 - 126 U/L   Total Bilirubin 0.2 (L) 0.3 - 1.2 mg/dL   GFR calc non Af Amer >60 >60 mL/min   GFR calc Af Amer >60 >60 mL/min    Comment: (NOTE) The eGFR has been calculated using the CKD EPI equation. This calculation has not been validated in all clinical situations. eGFR's persistently <60 mL/min signify possible Chronic Kidney Disease.    Anion gap 10 5 - 15  Ethanol (ETOH)     Status: Abnormal   Collection Time: 11/17/15  1:47 AM  Result Value Ref Range   Alcohol, Ethyl (B) 293 (H) <5 mg/dL    Comment:        LOWEST DETECTABLE LIMIT FOR SERUM ALCOHOL IS 5 mg/dL FOR MEDICAL PURPOSES ONLY   CBC     Status: Abnormal   Collection Time: 11/17/15  1:47 AM  Result Value Ref Range   WBC 6.7 3.8 - 10.6 K/uL   RBC 4.29 (L) 4.40 - 5.90 MIL/uL   Hemoglobin 14.0 13.0 - 18.0 g/dL   HCT 41.2 40.0 - 52.0 %   MCV 96.1 80.0 - 100.0 fL   MCH 32.7 26.0 - 34.0 pg   MCHC 34.1 32.0 - 36.0 g/dL   RDW 13.3 11.5 - 14.5 %   Platelets 103 (L) 150 - 440 K/uL  Acetaminophen level     Status: Abnormal   Collection Time: 11/17/15  1:47 AM  Result Value Ref Range   Acetaminophen (Tylenol), Serum <10 (L) 10 - 30 ug/mL    Comment:        THERAPEUTIC CONCENTRATIONS VARY SIGNIFICANTLY. A RANGE OF 10-30 ug/mL MAY BE AN EFFECTIVE CONCENTRATION FOR MANY PATIENTS. HOWEVER, SOME ARE BEST TREATED AT CONCENTRATIONS OUTSIDE THIS RANGE. ACETAMINOPHEN CONCENTRATIONS >150 ug/mL AT 4 HOURS AFTER INGESTION AND >50 ug/mL AT 12 HOURS AFTER INGESTION ARE OFTEN ASSOCIATED WITH TOXIC REACTIONS.   Salicylate level     Status: None   Collection Time: 11/17/15  1:47 AM  Result Value Ref Range   Salicylate Lvl <0.9 2.8 - 30.0 mg/dL    Current Facility-Administered Medications  Medication Dose Route Frequency Provider Last Rate Last Dose  . LORazepam (ATIVAN) injection 0-4 mg  0-4 mg Intravenous 4  times per day Paulette Blanch, MD   1 mg at 11/17/15 0303  . thiamine (VITAMIN B-1) tablet 100 mg  100 mg Oral Daily Paulette Blanch, MD   100 mg at 11/17/15 1010   No current outpatient prescriptions on file.    Musculoskeletal: Strength & Muscle Tone: decreased Gait & Station: ataxic Patient leans: N/A  Psychiatric Specialty Exam: Review of Systems  Constitutional: Negative.   HENT: Negative.   Eyes: Negative.   Respiratory: Negative.   Cardiovascular: Negative.   Gastrointestinal: Negative.   Musculoskeletal: Negative.   Skin: Negative.   Neurological: Negative.   Psychiatric/Behavioral: Positive for depression, suicidal ideas and substance abuse. Negative for hallucinations and memory loss. The patient is nervous/anxious and has insomnia.     Blood pressure 119/76, pulse 88, temperature 98.6 F (37 C), temperature source Oral, resp. rate 20, SpO2 99 %.There is no weight on file to calculate BMI.  General Appearance: Disheveled  Eye Contact::  Minimal  Speech:  Slow  Volume:  Decreased  Mood:  Dysphoric  Affect:  Depressed  Thought Process:  Goal Directed  Orientation:  Full (Time, Place, and Person)  Thought Content:  Negative  Suicidal Thoughts:  Yes.  without intent/plan  Homicidal Thoughts:  No  Memory:  Immediate;   Fair Recent;   Fair Remote;   Fair  Judgement:  Impaired  Insight:  Shallow  Psychomotor Activity:  Decreased  Concentration:  Fair  Recall:  [place]: Fair  Akathisia:  No  Handed:  Right  AIMS (if indicated):     Assets:  Resilience  ADL's:  Intact  Cognition: WNL  Sleep:      Treatment Plan Summary: Plan Patient does not meet commitment criteria and is not likely to benefit from inpatient hospitalization. He has been referred multiple times to outpatient substance abuse treatment and has information about a inpatient facility he intended to go to. At this point his appearance is stable. His suicidal ideation has no  specific intent to it. Plan is for discharge from the hospital. He is as always referred to go to outpatient substance abuse treatment.  Disposition: Patient does not meet criteria for psychiatric inpatient admission. Supportive therapy provided about ongoing stressors.  Alethia Berthold, MD 11/17/2015 4:04 PM

## 2015-11-17 NOTE — ED Notes (Signed)

## 2015-11-17 NOTE — ED Provider Notes (Signed)
 -----------------------------------------   5:34 PM on 11/17/2015 -----------------------------------------  Psychiatry consult note reviewed. Per Dr. Toni Amend the patient is psychiatrically stable. He has a long history of polysubstance abuse with cocaine and all the hall. He's been referred multiple times to substance abuse treatment but so far has not followed up. He is again referred and given information. Vital signs remain normal today, there is no evidence of withdrawal. We'll discharge home. Patient does not appear to be a danger to himself or others.  [name], MD 11/17/15 [phone]

## 2015-11-17 NOTE — ED Notes (Signed)
Report given to Yenty Bloch H in [facility] - pt transferred at this time

## 2015-11-17 NOTE — ED Notes (Addendum)
Pt threaten to leave the facility and pulled his IV out. Pt states he does not like be in a hallway. Pt informed that no rooms available at this time but he will be placed when one becomes available. Dr. Dolores Frame notified.

## 2015-11-17 NOTE — Discharge Instructions (Signed)
Alcohol Use Disorder °Alcohol use disorder is a mental disorder. It is not a one-time incident of heavy drinking. Alcohol use disorder is the excessive and uncontrollable use of alcohol over time that leads to problems with functioning in one or more areas of daily living. People with this disorder risk harming themselves and others when they drink to excess. Alcohol use disorder also can cause other mental disorders, such as mood and anxiety disorders, and serious physical problems. People with alcohol use disorder often misuse other drugs.  °Alcohol use disorder is common and widespread. Some people with this disorder drink alcohol to cope with or escape from negative life events. Others drink to relieve chronic pain or symptoms of mental illness. People with a family history of alcohol use disorder are at higher risk of losing control and using alcohol to excess.  °Drinking too much alcohol can cause injury, accidents, and health problems. One drink can be too much when you are: °· Working. °· Pregnant or breastfeeding. °· Taking medicines. Ask your doctor. °· Driving or planning to drive. °SYMPTOMS  °Signs and symptoms of alcohol use disorder may include the following:  °· Consumption of alcohol in larger amounts or over a longer period of time than intended. °· Multiple unsuccessful attempts to cut down or control alcohol use.   °· A great deal of time spent obtaining alcohol, using alcohol, or recovering from the effects of alcohol (hangover). °· A strong desire or urge to use alcohol (cravings).   °· Continued use of alcohol despite problems at work, school, or home because of alcohol use.   °· Continued use of alcohol despite problems in relationships because of alcohol use. °· Continued use of alcohol in situations when it is physically hazardous, such as driving a car. °· Continued use of alcohol despite awareness of a physical or psychological problem that is likely related to alcohol use. Physical  problems related to alcohol use can involve the brain, heart, liver, stomach, and intestines. Psychological problems related to alcohol use include intoxication, depression, anxiety, psychosis, delirium, and dementia.   °· The need for increased amounts of alcohol to achieve the same desired effect, or a decreased effect from the consumption of the same amount of alcohol (tolerance). °· Withdrawal symptoms upon reducing or stopping alcohol use, or alcohol use to reduce or avoid withdrawal symptoms. Withdrawal symptoms include: °· Racing heart. °· Hand tremor. °· Difficulty sleeping. °· Nausea. °· Vomiting. °· Hallucinations. °· Restlessness. °· Seizures. °DIAGNOSIS °Alcohol use disorder is diagnosed through an assessment by your health care provider. Your health care provider may start by asking three or four questions to screen for excessive or problematic alcohol use. To confirm a diagnosis of alcohol use disorder, at least two symptoms must be present within a 12-month period. The severity of alcohol use disorder depends on the number of symptoms: °· Mild--two or three. °· Moderate--four or five. °· Severe--six or more. °Your health care provider may perform a physical exam or use results from lab tests to see if you have physical problems resulting from alcohol use. Your health care provider may refer you to a mental health professional for evaluation. °TREATMENT  °Some people with alcohol use disorder are able to reduce their alcohol use to low-risk levels. Some people with alcohol use disorder need to quit drinking alcohol. When necessary, mental health professionals with specialized training in substance use treatment can help. Your health care provider can help you decide how severe your alcohol use disorder is and what type of treatment you need.   The following forms of treatment are available:  °· Detoxification. Detoxification involves the use of prescription medicines to prevent alcohol withdrawal  symptoms in the first week after quitting. This is important for people with a history of symptoms of withdrawal and for heavy drinkers who are likely to have withdrawal symptoms. Alcohol withdrawal can be dangerous and, in severe cases, cause death. Detoxification is usually provided in a hospital or in-patient substance use treatment facility. °· Counseling or talk therapy. Talk therapy is provided by substance use treatment counselors. It addresses the reasons people use alcohol and ways to keep them from drinking again. The goals of talk therapy are to help people with alcohol use disorder find healthy activities and ways to cope with life stress, to identify and avoid triggers for alcohol use, and to handle cravings, which can cause relapse. °· Medicines. Different medicines can help treat alcohol use disorder through the following actions: °¨ Decrease alcohol cravings. °¨ Decrease the positive reward response felt from alcohol use. °¨ Produce an uncomfortable physical reaction when alcohol is used (aversion therapy). °· Support groups. Support groups are run by people who have quit drinking. They provide emotional support, advice, and guidance. °These forms of treatment are often combined. Some people with alcohol use disorder benefit from intensive combination treatment provided by specialized substance use treatment centers. Both inpatient and outpatient treatment programs are available. °  °This information is not intended to replace advice given to you by your health care provider. Make sure you discuss any questions you have with your health care provider. °  °Document Released: 10/18/2004 Document Revised: 10/01/2014 Document Reviewed: 12/18/2012 °Elsevier Interactive Patient Education ©2016 [employer] ° °Suicidal Feelings: How to Help Yourself °Suicide is the taking of one's own life. If you feel as though life is getting too tough to handle and are thinking about suicide, get help right away. To get  help: °· Call your local emergency services (911 in the [place] °· Call a suicide hotline to speak with a trained counselor who understands how you are feeling. The following is a list of suicide hotlines in the [place]. For a list of hotlines in [place], visit www.suicide.org/hotlines/international/[place]-suicide-hotlines.html. °¨  1-800-273-TALK (1-800-273-8255). °¨  1-800-SUICIDE (1-800-784-2433). °¨  1-888-628-9454. This is a hotline for Spanish speakers. °¨  1-800-799-4TTY (1-800-799-4889). This is a hotline for TTY users. °¨  1-866-4-U-TREVOR (1-866-488-7386). This is a hotline for lesbian, gay, bisexual, transgender, or questioning youth. °· Contact a crisis center or a local suicide prevention center. To find a crisis center or suicide prevention center: °¨ Call your local hospital, clinic, community service organization, mental health center, social service provider, or health department. Ask for assistance in connecting to a crisis center. °¨ Visit www.suicidepreventionlifeline.org/getinvolved/locator for a list of crisis centers in the [place], or visit www.suicideprevention.[place]/thinking-about-suicide/find-a-crisis-centre for a list of centers in [place]. °· Visit the following websites: °¨  National Suicide Prevention Lifeline: www.suicidepreventionlifeline.org °¨  Hopeline: www.hopeline.com °¨  American Foundation for Suicide Prevention: www.afsp.org °¨  The Trevor Project (for lesbian, gay, bisexual, transgender, or questioning youth): www.thetrevorproject.org °HOW CAN I HELP MYSELF FEEL BETTER? °· Promise yourself that you will not do anything drastic when you have suicidal feelings. Remember, there is hope. Many people have gotten through suicidal thoughts and feelings, and you will, too. You may have gotten through them before, and this proves that you can get through them again. °· Let family, friends, teachers, or counselors know how you are feeling. Try not to isolate yourself from those  who care about you. Remember, they will   want to help you. Talk with someone every day, even if you do not feel sociable. Face-to-face conversation is best. °· Call a mental health professional and see one regularly. °· Visit your primary health care provider every year. °· Eat a well-balanced diet, and space your meals so you eat regularly. °· Get plenty of rest. °· Avoid alcohol and drugs, and remove them from your home. They will only make you feel worse. °· If you are thinking of taking a lot of medicine, give your medicine to someone who can give it to you one day at a time. If you are on antidepressants and are concerned you will overdose, let your health care provider know so he or she can give you safer medicines. Ask your mental health professional about the possible side effects of any medicines you are taking. °· Remove weapons, poisons, knives, and anything else that could harm you from your home. °· Try to stick to routines. Follow a schedule every day. Put self-care on your schedule. °· Make a list of realistic goals, and cross them off when you achieve them. Accomplishments give a sense of worth. °· Wait until you are feeling better before doing the things you find difficult or unpleasant. °· Exercise if you are able. You will feel better if you exercise for even a half hour each day. °· Go out in the sun or into nature. This will help you recover from depression faster. If you have a favorite place to walk, go there. °· Do the things that have always given you pleasure. Play your favorite music, read a good book, paint a picture, play your favorite instrument, or do anything else that takes your mind off your depression if it is safe to do. °· Keep your living space well lit. °· When you are feeling well, write yourself a letter about tips and support that you can read when you are not feeling well. °· Remember that life's difficulties can be sorted out with help. Conditions can be treated. You can  work on thoughts and strategies that serve you well. °  °This information is not intended to replace advice given to you by your health care provider. Make sure you discuss any questions you have with your health care provider. °  °Document Released: 03/17/2003 Document Revised: 10/01/2014 Document Reviewed: 01/05/2014 °Elsevier Interactive Patient Education ©2016 Elsevier Inc. ° °

## 2015-11-17 NOTE — ED Provider Notes (Signed)
[facility] Emergency Department Provider Note  ____________________________________________  Time seen: Approximately 2:06 AM  I have reviewed the triage vital signs and the nursing notes.   HISTORY  Chief Complaint Alcohol Intoxication    HPI Jose [name] is a 55 y.o. male who [place] with a chief complaintof alcohol intoxication and suicidal thoughts. Patient was recently admitted to the behavioral medicine unit for same. Reportedly he left detox, went drinking and use cocaine. Patient reportedly told EMS he was vomiting blood. EMS reports no blood visualized. Patient also told the EMS en route that he hears voices telling him to hurt himself and others. Denies trauma or injury. Denies fever, chills, headache, chest pain, shortness of breath, abdominal pain, diarrhea.   Past Medical History  Diagnosis Date  . Hypertension   . Hep C w/o coma, chronic (HCC)   . Polysubstance abuse     Patient Active Problem List   Diagnosis Date Noted  . 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 Substance induced mood disorder (HCC) 11/03/2015  . Alcohol use disorder, severe, dependence (HCC) 10/24/2015  . Alcohol withdrawal (HCC) 10/24/2015  . Stimulant use disorder (HCC) (cocaine) 10/24/2015  . Tobacco use disorder 10/24/2015  . Hepatitis C 09/06/2015    History reviewed. No pertinent past surgical history.  No current outpatient prescriptions on file.  Allergies Review of patient's allergies indicates no known allergies.  History reviewed. No pertinent family history.  Social History Social History  Substance Use Topics  . Smoking status: Current Every Day Smoker -- 0.50 packs/day for 45 years  . Smokeless tobacco: Never Used  . Alcohol Use: 36.0 oz/week    60 Cans of beer per week      Comment: 12 pack per day    Review of Systems Constitutional: No fever/chills. Eyes: No visual changes. ENT: No sore throat. Cardiovascular: Denies chest pain. Respiratory: Denies shortness of breath. Gastrointestinal: No abdominal pain.  No nausea, no vomiting.  No diarrhea.  No constipation. Genitourinary: Negative for dysuria. Musculoskeletal: Negative for back pain. Skin: Negative for rash. Neurological: Negative for headaches, focal weakness or numbness. Psychiatric:Positive for intoxication and suicidal thoughts.  10-point ROS otherwise negative.  ____________________________________________   PHYSICAL EXAM:  VITAL SIGNS: ED Triage Vitals  Enc Vitals Group     BP 11/17/15 0138 114/92 mmHg     Pulse Rate 11/17/15 0138 85     Resp 11/17/15 0138 18     Temp 11/17/15 0138 97.8 F (36.6 C)     Temp Source 11/17/15 0138 Oral     SpO2 11/17/15 0138 96 %     Weight --      Height --      Head Cir --      Peak Flow --      Pain Score 11/17/15 0140 0     Pain Loc --      Pain Edu? --      Excl. in GC? --     Constitutional: Alert and oriented. Dishelved appearing and in no acute distress. Intoxicated. Eyes: Conjunctivae are normal. PERRL. EOMI. Head: Atraumatic. Nose: No congestion/rhinnorhea. Mouth/Throat: Mucous membranes are moist.  Oropharynx non-erythematous. Neck: No stridor.  No cervical spine tenderness to palpation. Cardiovascular: Normal rate, regular rhythm. Grossly normal heart sounds.  Good peripheral circulation. Respiratory: Normal respiratory effort.  No retractions. Lungs  CTAB. Gastrointestinal: Soft and nontender. Hepatomegaly. No distention. No abdominal bruits. No CVA tenderness. Musculoskeletal: No lower extremity tenderness nor edema.  No joint effusions. Neurologic:  Normal speech and language. No gross focal neurologic deficits are appreciated. No gait instability. Skin:  Skin is warm, dry and intact. No rash noted. Psychiatric: Mood  and affect are normal. Speech and behavior are normal.  ____________________________________________   LABS (all labs ordered are listed, but only abnormal results are displayed)  Labs Reviewed  COMPREHENSIVE METABOLIC PANEL - Abnormal; Notable for the following:    Glucose, Bld 146 (*)    AST 59 (*)    Total Bilirubin 0.2 (*)    All other components within normal limits  ETHANOL - Abnormal; Notable for the following:    Alcohol, Ethyl (B) 293 (*)    All other components within normal limits  CBC - Abnormal; Notable for the following:    RBC 4.29 (*)    Platelets 103 (*)    All other components within normal limits  URINE DRUG SCREEN, QUALITATIVE ([facility] ONLY) - Abnormal; Notable for the following:    Cocaine Metabolite,Ur Scalp Level POSITIVE (*)    Benzodiazepine, Ur Scrn POSITIVE (*)    All other components within normal limits  ACETAMINOPHEN LEVEL - Abnormal; Notable for the following:    Acetaminophen (Tylenol), Serum <10 (*)    All other components within normal limits  SALICYLATE LEVEL   ____________________________________________  EKG  None ____________________________________________  RADIOLOGY  None ____________________________________________   PROCEDURES  Procedure(s) performed: None  Critical Care performed: No  ____________________________________________   INITIAL IMPRESSION / ASSESSMENT AND PLAN / ED COURSE  Pertinent labs & imaging results that were available during my care of the patient were reviewed by me and considered in my medical decision making (see chart for details).  55 year old male who presents for alcohol intoxication with suicidal thoughts. Patient is well-known to the emergency department with numerous prior visits for same and remains recalcitrant to treatment. At this time he contracts for safety and will remain voluntary pending TTS and psychiatry consults.  ----------------------------------------- 5:11 AM on  11/17/2015 -----------------------------------------  Patient was placed under involuntary commitment secondary to him trying to leave the premises. He was disgruntled at being placed in a hallway bed secondary to capacity and threatened to leave and kill himself. He was able to be verbally redirected to his bed without sedation.  ----------------------------------------- 7:31 AM on 11/17/2015 -----------------------------------------  Patient has been ambulatory with steady gait. Is alert and oriented 4. At this time he is medically cleared and will be transferred to [facility] pending psychiatry evaluation. ____________________________________________   FINAL CLINICAL IMPRESSION(S) / ED DIAGNOSES  Final diagnoses:  Depression  Alcohol use disorder, severe, dependence (HCC)  Stimulant use disorder (HCC) (cocaine)      Irean Hong, MD 11/17/15 [phone]

## 2015-11-17 NOTE — ED Notes (Signed)
Patient discharged ambulatory to self. He denies SI or HI. Discharge instructions reviewed with patient, he verbalizes understanding. Patient received copy of discharge instructions and all personal belongings.

## 2015-11-17 NOTE — BH Assessment (Signed)
Assessment Note  Jose [name] is an 55 y.o. male presenting to the ED due to an increase in thoughts of SI and the fear he may harm his self. He admits to cocaine and alcohol use. There are several stressors he has identified that are effecting his current mood and mental state. He's currently homeless, no money or income. He states he's [place] the woods for a [place] time and he's at his breaking point.Patient endorse SI with no specific plan. He denies HI and AV/H.  Pt was recently discharged from the [facility] BMU two days ago for the same.  Diagnosis: Alcohol intoxication  Past Medical History:  Past Medical History  Diagnosis Date  . Hypertension   . Hep C w/o coma, chronic (HCC)   . Polysubstance abuse     History reviewed. No pertinent past surgical history.  Family History: History reviewed. No pertinent family history.  Social History:  reports that he has been smoking.  He has never used smokeless tobacco. He reports that he drinks about 36.0 oz of alcohol per week. He reports that he uses illicit drugs (Cocaine).  Additional Social History:  Alcohol / Drug Use History of alcohol / drug use?: Yes  CIWA: CIWA-Ar BP: (!) 114/92 mmHg Pulse Rate: 85 COWS:    Allergies: No Known Allergies  Home Medications:  (Not in a hospital admission)  OB/GYN Status:  No LMP for male patient.  General Assessment Data Location of Assessment: [facility] ED TTS Assessment: In system Is this a Tele or Face-to-Face Assessment?: Face-to-Face Is this an Initial Assessment or a Re-assessment for this encounter?: Initial Assessment Marital status: Divorced [place] name: N/A Is patient pregnant?: No Pregnancy Status: No Living Arrangements: Other (Comment) (Homeless) Can pt return to current living arrangement?: Yes Admission Status: Voluntary Is patient capable of signing voluntary admission?: Yes Referral Source: Self/Family/Friend Insurance type: None     Crisis Care Plan Living  Arrangements: Other (Comment) (Homeless) Legal Guardian: Other: (self) Name of Psychiatrist: None Reported Name of Therapist: None Reported  Education Status Is patient currently in school?: No Current Grade: N/A Highest grade of school patient has completed: 10th grade Name of school: [employer] person: n/a  Risk to self with the past 6 months Suicidal Ideation: Yes-Currently Present Has patient been a risk to self within the past 6 months prior to admission? : No Suicidal Intent: No Has patient had any suicidal intent within the past 6 months prior to admission? : Yes Is patient at risk for suicide?: No Suicidal Plan?: No Has patient had any suicidal plan within the past 6 months prior to admission? : Yes Access to Means: No Specify Access to Suicidal Means: None What has been your use of drugs/alcohol within the last 12 months?: ETOH and cocaine Previous Attempts/Gestures: Yes How many times?: 2 Other Self Harm Risks: Chronic alcoholism Triggers for Past Attempts: None known Intentional Self Injurious Behavior: None Family Suicide History: No Recent stressful life event(s): Job Loss, Financial Problems Persecutory voices/beliefs?: No Depression: Yes Depression Symptoms: Loss of interest in usual pleasures, Feeling worthless/self pity Substance abuse history and/or treatment for substance abuse?: Yes Suicide prevention information given to non-admitted patients: Not applicable  Risk to Others within the past 6 months Homicidal Ideation: No Does patient have any lifetime risk of violence toward others beyond the six months prior to admission? : No Thoughts of Harm to Others: No Current Homicidal Intent: No Current Homicidal Plan: No Access to Homicidal Means: No  Describe Access to Homicidal Means: N/A Identified Victim: N/A History of harm to others?: No Assessment of Violence: None Noted Violent Behavior Description: None reported Does patient have  access to weapons?: No Criminal Charges Pending?: No Describe Pending Criminal Charges: None reported Does patient have a court date: No Is patient on probation?: No  Psychosis Hallucinations: None noted Delusions: None noted  Mental Status Report Appearance/Hygiene: In scrubs, Unremarkable, In hospital gown Eye Contact: Poor Motor Activity: Freedom of movement Speech: Slurred Level of Consciousness: Drowsy Mood: Other (Comment) (Intoxicated) Affect: Appropriate to circumstance Anxiety Level: Minimal Thought Processes: Circumstantial Judgement: Impaired Orientation: Person, Place, Time, Situation, Appropriate for developmental age Obsessive Compulsive Thoughts/Behaviors: None  Cognitive Functioning Concentration: Fair Memory: Recent Intact IQ: Average Insight: Fair Impulse Control: Fair Appetite: Fair Weight Loss: 0 Weight Gain: 0 Sleep: No Change Total Hours of Sleep: 3 Vegetative Symptoms: None  ADLScreening [facility] Assessment Services) Patient's cognitive ability adequate to safely complete daily activities?: Yes Patient able to express need for assistance with ADLs?: Yes Independently performs ADLs?: Yes (appropriate for developmental age)  Prior Inpatient Therapy Prior Inpatient Therapy: Yes Prior Therapy Dates: 11/14/2015 Prior Therapy Facilty/Provider(s): [facility] Reason for Treatment: Substance Use  Prior Outpatient Therapy Prior Outpatient Therapy: No Prior Therapy Dates: n/a Prior Therapy Facilty/Provider(s): n/a Reason for Treatment: n/a  ADL Screening (condition at time of admission) Patient's cognitive ability adequate to safely complete daily activities?: Yes Patient able to express need for assistance with ADLs?: Yes Independently performs ADLs?: Yes (appropriate for developmental age)       Abuse/Neglect Assessment (Assessment to be complete while patient is alone) Physical Abuse: Denies Verbal Abuse: Denies Sexual Abuse:  Denies Exploitation of patient/patient's resources: Denies Self-Neglect: Denies Values / Beliefs Cultural Requests During Hospitalization: None Spiritual Requests During Hospitalization: None Consults Spiritual Care Consult Needed: No Social Work Consult Needed: No Merchant navy officer (For Healthcare) Does patient have an advance directive?: No Would patient like information on creating an advanced directive?: No - patient declined information    Additional Information 1:1 In Past 12 Months?: No CIRT Risk: No Elopement Risk: No Does patient have medical clearance?: No     Disposition:  Disposition Initial Assessment Completed for this Encounter: Yes Disposition of Patient: Other dispositions (Psych MD to see) Other disposition(s): Other (Comment) (Psych MD to see) Patient referred to: Other (Comment) (Psych MD to see)  On Site Evaluation by:   Reviewed with Physician:    [place] 11/17/2015 2:38 AM

## 2015-11-17 NOTE — ED Notes (Signed)
Patient in day room, socializing with other patients. No acute s/s ETOH withdrawal. Maintained on 15 minute checks and observation by security camera for safety.

## 2015-11-17 NOTE — ED Notes (Signed)
Patient still asleep. Meal left at bedside. Maintained on 15 minute checks and observation by security camera for safety.

## 2015-11-17 NOTE — ED Notes (Signed)
Report received  Pt observed lying in hallway bed - head covered by blanket  Appears to be sleeping  - pt to move to [facility]

## 2015-11-17 NOTE — ED Notes (Signed)
Pt presents to ED via EMS for ETOH intoxication, appears intoxicated. Pt reports drinking unknown amount. Pt call EMS from a gas station, stating he was vomiting blood. EMS report no blood seen. Pt told EMS personnel en route that he hears voices telling him to hurt himself and other.

## 2015-11-17 NOTE — ED Notes (Signed)
Patient awake. Declining to eat, stating poor appetite. Took morning medications. Currently no acute s/s ETOH  withdrawal. Will continue CIWA per protocol and maintain all safety checks.

## 2015-11-20 ENCOUNTER — Emergency Department: Payer: Self-pay

## 2015-11-20 ENCOUNTER — Emergency Department
Admission: EM | Admit: 2015-11-20 | Discharge: 2015-11-20 | Discharge status: Against Medical Advice | Payer: Self-pay | Attending: Emergency Medicine | Admitting: Emergency Medicine

## 2015-11-20 DIAGNOSIS — I10 Essential (primary) hypertension: Secondary | ICD-10-CM | POA: Insufficient documentation

## 2015-11-20 DIAGNOSIS — F1310 Sedative, hypnotic or anxiolytic abuse, uncomplicated: Secondary | ICD-10-CM | POA: Insufficient documentation

## 2015-11-20 DIAGNOSIS — R109 Unspecified abdominal pain: Secondary | ICD-10-CM | POA: Insufficient documentation

## 2015-11-20 DIAGNOSIS — F1910 Other psychoactive substance abuse, uncomplicated: Secondary | ICD-10-CM

## 2015-11-20 DIAGNOSIS — F17200 Nicotine dependence, unspecified, uncomplicated: Secondary | ICD-10-CM | POA: Insufficient documentation

## 2015-11-20 DIAGNOSIS — F1410 Cocaine abuse, uncomplicated: Secondary | ICD-10-CM | POA: Insufficient documentation

## 2015-11-20 DIAGNOSIS — F10929 Alcohol use, unspecified with intoxication, unspecified: Secondary | ICD-10-CM

## 2015-11-20 DIAGNOSIS — F1024 Alcohol dependence with alcohol-induced mood disorder: Secondary | ICD-10-CM | POA: Insufficient documentation

## 2015-11-20 DIAGNOSIS — F3289 Other specified depressive episodes: Secondary | ICD-10-CM

## 2015-11-20 DIAGNOSIS — F911 Conduct disorder, childhood-onset type: Secondary | ICD-10-CM | POA: Insufficient documentation

## 2015-11-20 LAB — URINE DRUG SCREEN, QUALITATIVE (ARMC ONLY)
AMPHETAMINES, UR SCREEN: NOT DETECTED
Barbiturates, Ur Screen: NOT DETECTED
Benzodiazepine, Ur Scrn: POSITIVE — AB
COCAINE METABOLITE, UR ~~LOC~~: POSITIVE — AB
Cannabinoid 50 Ng, Ur ~~LOC~~: NOT DETECTED
MDMA (ECSTASY) UR SCREEN: NOT DETECTED
Methadone Scn, Ur: NOT DETECTED
OPIATE, UR SCREEN: NOT DETECTED
PHENCYCLIDINE (PCP) UR S: NOT DETECTED
Tricyclic, Ur Screen: NOT DETECTED

## 2015-11-20 LAB — BASIC METABOLIC PANEL
Anion gap: 11 (ref 5–15)
BUN: 7 mg/dL (ref 6–20)
CALCIUM: 9.3 mg/dL (ref 8.9–10.3)
CHLORIDE: 107 mmol/L (ref 101–111)
CO2: 24 mmol/L (ref 22–32)
CREATININE: 0.73 mg/dL (ref 0.61–1.24)
GFR calc non Af Amer: >60 mL/min (ref >60)
Glucose, Bld: 108 mg/dL — ABNORMAL HIGH (ref 65–99)
Potassium: 3.6 mmol/L (ref 3.5–5.1)
SODIUM: 142 mmol/L (ref 135–145)

## 2015-11-20 LAB — ACETAMINOPHEN LEVEL: Acetaminophen (Tylenol), Serum: <10 ug/mL — ABNORMAL LOW (ref 10–30)

## 2015-11-20 LAB — CBC
HCT: 43.9 % (ref 40.0–52.0)
Hemoglobin: 14.9 g/dL (ref 13.0–18.0)
MCH: 32.9 pg (ref 26.0–34.0)
MCHC: 34 g/dL (ref 32.0–36.0)
MCV: 96.8 fL (ref 80.0–100.0)
Platelets: 124 10*3/uL — ABNORMAL LOW (ref 150–440)
RBC: 4.53 MIL/uL (ref 4.40–5.90)
RDW: 13.6 % (ref 11.5–14.5)
WBC: 6.9 10*3/uL (ref 3.8–10.6)

## 2015-11-20 LAB — SALICYLATE LEVEL: Salicylate Lvl: <4 mg/dL (ref 2.8–30.0)

## 2015-11-20 LAB — ETHANOL: Alcohol, Ethyl (B): 311 mg/dL (ref <5)

## 2015-11-20 LAB — TROPONIN I: TROPONIN I: 0.03 ng/mL (ref <0.031)

## 2015-11-20 NOTE — ED Notes (Signed)
Pt brought to triage by [facility]. Pt is homeless and  reports he started having chest pain while walking down the street with a friend. Pt reports left side chest pain that radiates into his shoulder and back. Pt also reports he would like help with his alcohol and cocaine use. Pt also co pain to his left knee. Pt reports he has Hep C and would like his liver evaluated.

## 2015-11-20 NOTE — ED Notes (Signed)
[place], MD at bedside. 

## 2015-11-20 NOTE — Discharge Instructions (Signed)
You were seen in the emergency department for alcohol intoxication.  Please seek help from the recommended resources for assistance with your alcohol dependence.  If you have any thoughts of hurting herself or others, please call 911 or return to the emergency department.  Please avoid drug and alcohol use.  Never drive a vehicle or operate machinery while intoxicated. ° ° °Alcohol Intoxication °Alcohol intoxication occurs when the amount of alcohol that a person has consumed impairs his or her ability to mentally and physically function. Alcohol directly impairs the normal chemical activity of the brain. Drinking large amounts of alcohol can lead to changes in mental function and behavior, and it can cause many physical effects that can be harmful.  °Alcohol intoxication can range in severity from mild to very severe. Various factors can affect the level of intoxication that occurs, such as the person's age, gender, weight, frequency of alcohol consumption, and the presence of other medical conditions (such as diabetes, seizures, or heart conditions). Dangerous levels of alcohol intoxication may occur when people drink large amounts of alcohol in a short period (binge drinking). Alcohol can also be especially dangerous when combined with certain prescription medicines or "recreational" drugs. °SIGNS AND SYMPTOMS °Some common signs and symptoms of mild alcohol intoxication include: °Loss of coordination. °Changes in mood and behavior. °Impaired judgment. °Slurred speech. °As alcohol intoxication progresses to more severe levels, other signs and symptoms will appear. These may include: °Vomiting. °Confusion and impaired memory. °Slowed breathing. °Seizures. °Loss of consciousness. °DIAGNOSIS  °Your health care provider will take a medical history and perform a physical exam. You will be asked about the amount and type of alcohol you have consumed. Blood tests will be done to measure the concentration of alcohol in  your blood. In many places, your blood alcohol level must be lower than 80 mg/dL (0.08%) to legally drive. However, many dangerous effects of alcohol can occur at much lower levels.  °TREATMENT  °People with alcohol intoxication often do not require treatment. Most of the effects of alcohol intoxication are temporary, and they go away as the alcohol naturally leaves the body. Your health care provider will monitor your condition until you are stable enough to go home. Fluids are sometimes given through an IV access tube to help prevent dehydration.  °HOME CARE INSTRUCTIONS °Do not drive after drinking alcohol. °Stay hydrated. Drink enough water and fluids to keep your urine clear or pale yellow. Avoid caffeine.   °Only take over-the-counter or prescription medicines as directed by your health care provider.   °SEEK MEDICAL CARE IF:  °You have persistent vomiting.   °You do not feel better after a few days. °You have frequent alcohol intoxication. Your health care provider can help determine if you should see a substance use treatment counselor. °SEEK IMMEDIATE MEDICAL CARE IF:  °You become shaky or tremble when you try to stop drinking.   °You shake uncontrollably (seizure).   °You throw up (vomit) blood. This may be bright red or may look like black coffee grounds.   °You have blood in your stool. This may be bright red or may appear as a black, tarry, bad smelling stool.   °You become lightheaded or faint.   °MAKE SURE YOU:  °Understand these instructions. °Will watch your condition. °Will get help right away if you are not doing well or get worse. °Document Released: 06/20/2005 Document Revised: 05/13/2013 Document Reviewed: 02/13/2013 °ExitCare® Patient Information ©2015 [employer]. This information is not intended to replace advice   given to you by your health care provider. Make sure you discuss any questions you have with your health care provider. ° °Finding Treatment for Alcohol and Drug Addiction °It can  be hard to find the right place to get professional treatment. Here are some important things to consider: °There are different types of treatment to choose from. °Some programs are live-in (residential) while others are not (outpatient). Sometimes a combination is offered. °No single type of program is right for everyone. °Most treatment programs involve a combination of education, counseling, and a 12-step, spiritually-based approach. °There are non-spiritually based programs (not 12-step). °Some treatment programs are government sponsored. They are geared for patients without private insurance. °Treatment programs can vary in many respects such as: °Cost and types of insurance accepted. °Types of on-site medical services offered. °Length of stay, setting, and size. °Overall philosophy of treatment.  °A person may need specialized treatment or have needs not addressed by all programs. For example, adolescents need treatment appropriate for their age. Other people have secondary disorders that must be managed as well. Secondary conditions can include mental illness, such as depression or diabetes. Often, a period of detoxification from alcohol or drugs is needed. This requires medical supervision and not all programs offer this. °THINGS TO CONSIDER WHEN SELECTING A TREATMENT PROGRAM  °Is the program certified by the appropriate government agency? Even private programs must be certified and employ certified professionals. °Does the program accept your insurance? If not, can a payment plan be set up? °Is the facility clean, organized, and well run? Do they allow you to speak with graduates who can share their treatment experience with you? Can you tour the facility? Can you meet with staff? °Does the program meet the full range of individual needs? °Does the treatment program address sexual orientation and physical disabilities? Do they provide age, gender, and culturally appropriate treatment services? °Is treatment  available in languages other than English? °Is long-term aftercare support or guidance encouraged and provided? °Is assessment of an individual's treatment plan ongoing to ensure it meets changing needs? °Does the program use strategies to encourage reluctant patients to remain in treatment long enough to increase the likelihood of success? °Does the program offer counseling (individual or group) and other behavioral therapies? °Does the program offer medicine as part of the treatment regimen, if needed? °Is there ongoing monitoring of possible relapse? Is there a defined relapse prevention program? Are services or referrals offered to family members to ensure they understand addiction and the recovery process? This would help them support the recovering individual. °Are 12-step meetings held at the center or is transport available for patients to attend outside meetings? °In countries outside of the [place] and [place], see local directories for contact information for services in your area. °Document Released: 08/09/2005 Document Revised: 12/03/2011 Document Reviewed: 02/19/2008 °ExitCare® Patient Information ©2015 [employer]. This information is not intended to replace advice given to you by your health care provider. Make sure you discuss any questions you have with your health care provider. ° ° °

## 2015-11-20 NOTE — ED Provider Notes (Signed)
[facility] Emergency Department Provider Note  ____________________________________________  Time seen: Approximately 3:18 AM  I have reviewed the triage vital signs and the nursing notes.   HISTORY  Chief Complaint Chest Pain; Addiction Problem; and Alcohol Problem  Patient is intoxicated.  HPI Jose [name] is a 55 y.o. male with history of polysubstance abuse including alcohol and cocaine, essentially chronic suicidal ideation, substance induced mood disorder, and frequent emergency department visits for the same.  He is homeless and reports that he sleeps in the woods and frequent comes to the emergency department at night.  He also frequent complaints of chest and shoulder pain.  He has had numerous evaluations in the past including psychiatric evaluations and is always found to not meet inpatient criteria nor IVC criteria.  He presents tonight with the same complaints, that he was having chest pain walking on the street with a friend.  Tonight, however, he is not endorsing any suicidal ideation or homicidal ideation.  He is intoxicated but walking straight and conversing easily.  He is contentious and verbally aggressive and demanding as usual.  He is in no acute distress.  He denies nausea or vomiting, abdominal pain, shortness of breath.  He cannot describe the way that his chest pain feels and finally says that it is gone at this time.  Prior to me going in to see the patient, as the 2 hours after his arrival, he became even more agitated and threatening and demanded to go.  He had an IV in place at the time so that was when I entered the room to evaluate him and discuss the plan.   Past Medical History  Diagnosis Date  . Hypertension   . Hep C w/o coma, chronic (HCC)   . Polysubstance abuse     Patient Active Problem List   Diagnosis Date Noted  . 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 Substance induced mood disorder (HCC) 11/03/2015  . Alcohol use disorder, severe, dependence (HCC) 10/24/2015  . Alcohol withdrawal (HCC) 10/24/2015  . Stimulant use disorder (HCC) (cocaine) 10/24/2015  . Tobacco use disorder 10/24/2015  . Hepatitis C 09/06/2015    No past surgical history on file.  No current outpatient prescriptions on file.  Allergies Review of patient's allergies indicates no known allergies.  No family history on file.  Social History Social History  Substance Use Topics  . Smoking status: Current Every Day Smoker -- 0.50 packs/day for 45 years  . Smokeless tobacco: Never Used  . Alcohol Use: 36.0 oz/week    60 Cans of beer per week     Comment: 12 pack per day    Review of Systems Constitutional: No fever/chills Eyes: No visual changes. ENT: No sore throat. Cardiovascular: Intermittent mild chest pain on the left side Respiratory: Denies shortness of breath. Gastrointestinal: No abdominal pain.  No nausea, no vomiting.  No diarrhea.  No constipation. Genitourinary: Negative for dysuria. Musculoskeletal: Negative for back pain. Skin: Negative for rash. Neurological: Negative for headaches, focal weakness or numbness.  10-point ROS otherwise negative.  ____________________________________________   PHYSICAL EXAM:  VITAL SIGNS: ED Triage Vitals  Enc Vitals Group     BP 11/20/15 0153 128/79 mmHg     Pulse Rate 11/20/15 0153 86     Resp 11/20/15 0153 18     Temp 11/20/15 0153 97.7 F (36.5 C)  Temp Source 11/20/15 0153 Oral     SpO2 11/20/15 0153 94 %     Weight 11/20/15 0153 140 lb (63.504 kg)     Height 11/20/15 0153 5\' 8"  (1.727 m)     Head Cir --      Peak Flow --      Pain Score 11/20/15 0154 10     Pain Loc --      Pain Edu? --      Excl. in GC? --     Constitutional: Alert and oriented.  Disheveled at his baseline.  Ambulating without difficulty.   Appears mildly intoxicated. Eyes: Conjunctivae are normal. PERRL. EOMI. Head: Atraumatic. Nose: No congestion/rhinnorhea. Mouth/Throat: Mucous membranes are moist.  Oropharynx non-erythematous. Neck: No stridor.   Cardiovascular: Normal rate, regular rhythm. Grossly normal heart sounds.  Good peripheral circulation. Respiratory: Normal respiratory effort.  No retractions. Lungs CTAB. Gastrointestinal: Soft and nontender. No distention. No abdominal bruits. No CVA tenderness. Musculoskeletal: No lower extremity tenderness nor edema.  No joint effusions. Neurologic:  Normal speech and language. No gross focal neurologic deficits are appreciated.  Skin:  Skin is warm, dry and intact. No rash noted. Psychiatric: The patient is verbally aggressive, threatening, speaking in full sentences, denies SI/HI.  ____________________________________________   LABS (all labs ordered are listed, but only abnormal results are displayed)  Labs Reviewed  BASIC METABOLIC PANEL - Abnormal; Notable for the following:    Glucose, Bld 108 (*)    All other components within normal limits  CBC - Abnormal; Notable for the following:    Platelets 124 (*)    All other components within normal limits  ETHANOL - Abnormal; Notable for the following:    Alcohol, Ethyl (B) 311 (*)    All other components within normal limits  ACETAMINOPHEN LEVEL - Abnormal; Notable for the following:    Acetaminophen (Tylenol), Serum <10 (*)    All other components within normal limits  URINE DRUG SCREEN, QUALITATIVE ([facility] ONLY) - Abnormal; Notable for the following:    Cocaine Metabolite,Ur [place] POSITIVE (*)    Benzodiazepine, Ur Scrn POSITIVE (*)    All other components within normal limits  TROPONIN I  SALICYLATE LEVEL   ____________________________________________  EKG  ED ECG REPORT I, Seniyah Esker, the attending physician, personally viewed and interpreted this ECG.   Date: 11/20/2015  EKG Time: 01:51  Rate: 94   Rhythm: RBBB  Axis: Normal  Intervals:right bundle branch block  ST&T Change: Non-specific ST segment / T-wave changes, but no evidence of acute ischemia.   ____________________________________________  RADIOLOGY   Dg Chest 2 View  11/20/2015  CLINICAL DATA:  Left-sided chest pain radiating to the back. EXAM: CHEST  2 VIEW COMPARISON:  09/19/2015 FINDINGS: The cardiomediastinal contours are normal. Borderline hyperinflation. Pulmonary vasculature is normal. No consolidation, pleural effusion, or pneumothorax. No acute osseous abnormalities are seen. IMPRESSION: No acute pulmonary process. Electronically Signed   By: Rubye Oaks M.D.   On: 11/20/2015 02:28    ____________________________________________   PROCEDURES  Procedure(s) performed: None  Critical Care performed: No ____________________________________________   INITIAL IMPRESSION / ASSESSMENT AND PLAN / ED COURSE  Pertinent labs & imaging results that were available during my care of the patient were reviewed by me and considered in my medical decision making (see chart for details).  The patient is well-known to the emergency department and frequently presents in a similar condition.  Even though he is intoxicated, he is clearly capable of making his own decisions.  He is not a danger to himself or anyone else at this time except that he was escalating in the emergency department while we tried to convince him to stay for further evaluation.  I made the decision to discharge him as per his request rather than to risk escalating the situation and risk harm to the patient or our staff if he became violent; I determined it would be safer to discharge him at what is essentially his baseline than take away his civil rights with an IVC and risk escalating the situation and causing physical injury to the patient or our staff.  He left ambulating without difficulty with his  belongings.  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     Note:  This document was prepared using Dragon voice recognition software and may include unintentional dictation errors.   Loleta Rose, MD 11/20/15 [phone]

## 2015-11-20 NOTE — ED Notes (Signed)
Pt being very verbally aggressive and demanding since arrival. Pt complaining that TV doesn't work, complaining that he didn't get something to drink fast enough (prior to being seen buy EDP). Pt standing in hallway yelling that he's ready to just walk out, MD informed and immediately went into room at see pt. Pt continues to be verbally aggressive, threatening staff, and removed his own PIV and threw it at MD. Pt is here voluntarily and demanding to leave AMA at this time. Pt given his belongings and getting dressed at this time with assistance from [place], [place].

## 2015-11-22 ENCOUNTER — Encounter: Payer: Self-pay | Admitting: Emergency Medicine

## 2015-11-22 ENCOUNTER — Emergency Department
Admission: EM | Admit: 2015-11-22 | Discharge: 2015-11-22 | Discharge status: Against Medical Advice | Payer: Self-pay | Attending: Emergency Medicine | Admitting: Emergency Medicine

## 2015-11-22 DIAGNOSIS — F14120 Cocaine abuse with intoxication, uncomplicated: Secondary | ICD-10-CM | POA: Insufficient documentation

## 2015-11-22 DIAGNOSIS — F1590 Other stimulant use, unspecified, uncomplicated: Secondary | ICD-10-CM

## 2015-11-22 DIAGNOSIS — I10 Essential (primary) hypertension: Secondary | ICD-10-CM | POA: Insufficient documentation

## 2015-11-22 DIAGNOSIS — F14920 Cocaine use, unspecified with intoxication, uncomplicated: Secondary | ICD-10-CM

## 2015-11-22 DIAGNOSIS — R079 Chest pain, unspecified: Secondary | ICD-10-CM | POA: Insufficient documentation

## 2015-11-22 DIAGNOSIS — F17200 Nicotine dependence, unspecified, uncomplicated: Secondary | ICD-10-CM | POA: Insufficient documentation

## 2015-11-22 DIAGNOSIS — F14188 Cocaine abuse with other cocaine-induced disorder: Secondary | ICD-10-CM | POA: Insufficient documentation

## 2015-11-22 DIAGNOSIS — F10920 Alcohol use, unspecified with intoxication, uncomplicated: Secondary | ICD-10-CM

## 2015-11-22 DIAGNOSIS — F1310 Sedative, hypnotic or anxiolytic abuse, uncomplicated: Secondary | ICD-10-CM | POA: Insufficient documentation

## 2015-11-22 DIAGNOSIS — F10220 Alcohol dependence with intoxication, uncomplicated: Secondary | ICD-10-CM | POA: Insufficient documentation

## 2015-11-22 LAB — CBC WITH DIFFERENTIAL/PLATELET
BASOS PCT: 2 %
Basophils Absolute: 0.1 10*3/uL (ref 0–0.1)
Eosinophils Absolute: 0.4 10*3/uL (ref 0–0.7)
Eosinophils Relative: 7 %
HEMATOCRIT: 40.6 % (ref 40.0–52.0)
HEMOGLOBIN: 14 g/dL (ref 13.0–18.0)
LYMPHS PCT: 41 %
Lymphs Abs: 2.6 10*3/uL (ref 1.0–3.6)
MCH: 32.7 pg (ref 26.0–34.0)
MCHC: 34.5 g/dL (ref 32.0–36.0)
MCV: 94.6 fL (ref 80.0–100.0)
MONO ABS: 0.5 10*3/uL (ref 0.2–1.0)
MONOS PCT: 8 %
NEUTROS ABS: 2.7 10*3/uL (ref 1.4–6.5)
NEUTROS PCT: 42 %
Platelets: 114 10*3/uL — ABNORMAL LOW (ref 150–440)
RBC: 4.29 MIL/uL — ABNORMAL LOW (ref 4.40–5.90)
RDW: 13.1 % (ref 11.5–14.5)
WBC: 6.4 10*3/uL (ref 3.8–10.6)

## 2015-11-22 LAB — URINE DRUG SCREEN, QUALITATIVE (ARMC ONLY)
Amphetamines, Ur Screen: NOT DETECTED
BARBITURATES, UR SCREEN: NOT DETECTED
Benzodiazepine, Ur Scrn: POSITIVE — AB
CANNABINOID 50 NG, UR ~~LOC~~: NOT DETECTED
COCAINE METABOLITE, UR ~~LOC~~: POSITIVE — AB
MDMA (ECSTASY) UR SCREEN: NOT DETECTED
Methadone Scn, Ur: NOT DETECTED
OPIATE, UR SCREEN: NOT DETECTED
PHENCYCLIDINE (PCP) UR S: NOT DETECTED
Tricyclic, Ur Screen: NOT DETECTED

## 2015-11-22 LAB — COMPREHENSIVE METABOLIC PANEL
ALBUMIN: 4 g/dL (ref 3.5–5.0)
ALK PHOS: 89 U/L (ref 38–126)
ALT: 39 U/L (ref 17–63)
AST: 50 U/L — AB (ref 15–41)
Anion gap: 8 (ref 5–15)
BILIRUBIN TOTAL: 0.5 mg/dL (ref 0.3–1.2)
BUN: 10 mg/dL (ref 6–20)
CALCIUM: 9.2 mg/dL (ref 8.9–10.3)
CO2: 27 mmol/L (ref 22–32)
Chloride: 104 mmol/L (ref 101–111)
Creatinine, Ser: 0.8 mg/dL (ref 0.61–1.24)
GFR calc Af Amer: >60 mL/min (ref >60)
GLUCOSE: 106 mg/dL — AB (ref 65–99)
POTASSIUM: 3.7 mmol/L (ref 3.5–5.1)
Sodium: 139 mmol/L (ref 135–145)
TOTAL PROTEIN: 7.4 g/dL (ref 6.5–8.1)

## 2015-11-22 LAB — ETHANOL: ALCOHOL ETHYL (B): 301 mg/dL — AB (ref <5)

## 2015-11-22 LAB — TROPONIN I: Troponin I: <0.03 ng/mL (ref <0.031)

## 2015-11-22 LAB — LIPASE, BLOOD: Lipase: 45 U/L (ref 11–51)

## 2015-11-22 MED ORDER — SODIUM CHLORIDE 0.9 % IV BOLUS (SEPSIS)
1000.0000 mL | Freq: Once | INTRAVENOUS | Status: AC
  Administered 2015-11-22: 1000 mL via INTRAVENOUS
  Filled 2015-11-22: qty 1000

## 2015-11-22 MED ORDER — LORAZEPAM 2 MG/ML IJ SOLN
1.0000 mg | Freq: Once | INTRAMUSCULAR | Status: AC
  Administered 2015-11-22: 1 mg via INTRAVENOUS
  Filled 2015-11-22: qty 1

## 2015-11-22 NOTE — ED Notes (Signed)
Patient became increasing hostile and refused to allow this RN to remove his IV.  He escalated the situation with anger and attitude until we had to call [employer] to come and allow [place] to forcefully remove the IV.  We applied 2x2 gauze with paper tape and patient had some remaining bleeding.  At this time [employer] took him under arrest due to causing unrest in the ER after being warned multiple times to cease.  Patient resisted arrest initially but was overwhelmed until cuffed and then Danelle Earthly and I went outside with the patient while under police custody to apply pressure to his [facility] area due to bleeding.  As of this time bleeding has stopped and we left [employer] to handle their suspect.

## 2015-11-22 NOTE — Discharge Instructions (Signed)
You decided to leave AGAINST MEDICAL ADVICE before your evaluation was completed. Stop using illegal drugs and drink alcohol only in moderation. Return to the ER for worsening symptoms, persistent vomiting, difficulty breathing or other concerns.  Alcohol Intoxication Alcohol intoxication occurs when the amount of alcohol that a person has consumed impairs his or her ability to mentally and physically function. Alcohol directly impairs the normal chemical activity of the brain. Drinking large amounts of alcohol can lead to changes in mental function and behavior, and it can cause many physical effects that can be harmful.  Alcohol intoxication can range in severity from mild to very severe. Various factors can affect the level of intoxication that occurs, such as the person's age, gender, weight, frequency of alcohol consumption, and the presence of other medical conditions (such as diabetes, seizures, or heart conditions). Dangerous levels of alcohol intoxication may occur when people drink large amounts of alcohol in a short period (binge drinking). Alcohol can also be especially dangerous when combined with certain prescription medicines or "recreational" drugs. SIGNS AND SYMPTOMS Some common signs and symptoms of mild alcohol intoxication include:  Loss of coordination.  Changes in mood and behavior.  Impaired judgment.  Slurred speech. As alcohol intoxication progresses to more severe levels, other signs and symptoms will appear. These may include:  Vomiting.  Confusion and impaired memory.  Slowed breathing.  Seizures.  Loss of consciousness. DIAGNOSIS  Your health care provider will take a medical history and perform a physical exam. You will be asked about the amount and type of alcohol you have consumed. Blood tests will be done to measure the concentration of alcohol in your blood. In many places, your blood alcohol level must be lower than 80 mg/dL (3.47%) to legally drive.  However, many dangerous effects of alcohol can occur at much lower levels.  TREATMENT  People with alcohol intoxication often do not require treatment. Most of the effects of alcohol intoxication are temporary, and they go away as the alcohol naturally leaves the body. Your health care provider will monitor your condition until you are stable enough to go home. Fluids are sometimes given through an IV access tube to help prevent dehydration.  HOME CARE INSTRUCTIONS  Do not drive after drinking alcohol.  Stay hydrated. Drink enough water and fluids to keep your urine clear or pale yellow. Avoid caffeine.   Only take over-the-counter or prescription medicines as directed by your health care provider.  SEEK MEDICAL CARE IF:   You have persistent vomiting.   You do not feel better after a few days.  You have frequent alcohol intoxication. Your health care provider can help determine if you should see a substance use treatment counselor. SEEK IMMEDIATE MEDICAL CARE IF:   You become shaky or tremble when you try to stop drinking.   You shake uncontrollably (seizure).   You throw up (vomit) blood. This may be bright red or may look like black coffee grounds.   You have blood in your stool. This may be bright red or may appear as a black, tarry, bad smelling stool.   You become lightheaded or faint.  MAKE SURE YOU:   Understand these instructions.  Will watch your condition.  Will get help right away if you are not doing well or get worse.   This information is not intended to replace advice given to you by your health care provider. Make sure you discuss any questions you have with your health care provider.   Document  Released: 06/20/2005 Document Revised: 05/13/2013 Document Reviewed: 02/13/2013 Elsevier Interactive Patient Education 2016 [employer] Inc[employer]  Nonspecific Chest Pain  Chest pain can be caused by many different conditions. There is always a chance that your pain  could be related to something serious, such as a heart attack or a blood clot in your lungs. Chest pain can also be caused by conditions that are not life-threatening. If you have chest pain, it is very important to follow up with your health care provider. CAUSES  Chest pain can be caused by:  Heartburn.  Pneumonia or bronchitis.  Anxiety or stress.  Inflammation around your heart (pericarditis) or lung (pleuritis or pleurisy).  A blood clot in your lung.  A collapsed lung (pneumothorax). It can develop suddenly on its own (spontaneous pneumothorax) or from trauma to the chest.  Shingles infection (varicella-zoster virus).  Heart attack.  Damage to the bones, muscles, and cartilage that make up your chest wall. This can include:  Bruised bones due to injury.  Strained muscles or cartilage due to frequent or repeated coughing or overwork.  Fracture to one or more ribs.  Sore cartilage due to inflammation (costochondritis). RISK FACTORS  Risk factors for chest pain may include:  Activities that increase your risk for trauma or injury to your chest.  Respiratory infections or conditions that cause frequent coughing.  Medical conditions or overeating that can cause heartburn.  Heart disease or family history of heart disease.  Conditions or health behaviors that increase your risk of developing a blood clot.  Having had chicken pox (varicella zoster). SIGNS AND SYMPTOMS Chest pain can feel like:  Burning or tingling on the surface of your chest or deep in your chest.  Crushing, pressure, aching, or squeezing pain.  Dull or sharp pain that is worse when you move, cough, or take a deep breath.  Pain that is also felt in your back, neck, shoulder, or arm, or pain that spreads to any of these areas. Your chest pain may come and go, or it may stay constant. DIAGNOSIS Lab tests or other studies may be needed to find the cause of your pain. Your health care provider may  have you take a test called an ambulatory ECG (electrocardiogram). An ECG records your heartbeat patterns at the time the test is performed. You may also have other tests, such as:  Transthoracic echocardiogram (TTE). During echocardiography, sound waves are used to create a picture of all of the heart structures and to look at how blood flows through your heart.  Transesophageal echocardiogram (TEE).This is a more advanced imaging test that obtains images from inside your body. It allows your health care provider to see your heart in finer detail.  Cardiac monitoring. This allows your health care provider to monitor your heart rate and rhythm in real time.  Holter monitor. This is a portable device that records your heartbeat and can help to diagnose abnormal heartbeats. It allows your health care provider to track your heart activity for several days, if needed.  Stress tests. These can be done through exercise or by taking medicine that makes your heart beat more quickly.  Blood tests.  Imaging tests. TREATMENT  Your treatment depends on what is causing your chest pain. Treatment may include:  Medicines. These may include:  Acid blockers for heartburn.  Anti-inflammatory medicine.  Pain medicine for inflammatory conditions.  Antibiotic medicine, if an infection is present.  Medicines to dissolve blood clots.  Medicines to treat coronary artery disease.  Supportive care for conditions that do not require medicines. This may include:  Resting.  Applying heat or cold packs to injured areas.  Limiting activities until pain decreases. HOME CARE INSTRUCTIONS  If you were prescribed an antibiotic medicine, finish it all even if you start to feel better.  Avoid any activities that bring on chest pain.  Do not use any tobacco products, including cigarettes, chewing tobacco, or electronic cigarettes. If you need help quitting, ask your health care provider.  Do not drink  alcohol.  Take medicines only as directed by your health care provider.  Keep all follow-up visits as directed by your health care provider. This is important. This includes any further testing if your chest pain does not go away.  If heartburn is the cause for your chest pain, you may be told to keep your head raised (elevated) while sleeping. This reduces the chance that acid will go from your stomach into your esophagus.  Make lifestyle changes as directed by your health care provider. These may include:  Getting regular exercise. Ask your health care provider to suggest some activities that are safe for you.  Eating a heart-healthy diet. A registered dietitian can help you to learn healthy eating options.  Maintaining a healthy weight.  Managing diabetes, if necessary.  Reducing stress. SEEK MEDICAL CARE IF:  Your chest pain does not go away after treatment.  You have a rash with blisters on your chest.  You have a fever. SEEK IMMEDIATE MEDICAL CARE IF:   Your chest pain is worse.  You have an increasing cough, or you cough up blood.  You have severe abdominal pain.  You have severe weakness.  You faint.  You have chills.  You have sudden, unexplained chest discomfort.  You have sudden, unexplained discomfort in your arms, back, neck, or jaw.  You have shortness of breath at any time.  You suddenly start to sweat, or your skin gets clammy.  You feel nauseous or you vomit.  You suddenly feel light-headed or dizzy.  Your heart begins to beat quickly, or it feels like it is skipping beats. These symptoms may represent a serious problem that is an emergency. Do not wait to see if the symptoms will go away. Get medical help right away. Call your local emergency services (911 in the [place] Do not drive yourself to the hospital.   This information is not intended to replace advice given to you by your health care provider. Make sure you discuss any questions you  have with your health care provider.   Document Released: 06/20/2005 Document Revised: 10/01/2014 Document Reviewed: 04/16/2014 Elsevier Interactive Patient Education [employer]

## 2015-11-22 NOTE — ED Notes (Signed)
[facility], [place], LAB, Dr Dolores Frame - orders

## 2015-11-22 NOTE — ED Notes (Addendum)
Pt arrived by EMS from parking lot with c/o mid sternal chest pain that radiated down left arm and down left leg, pain reported 9/10. EMS reports giving pt  Asprin and 1 Nitro spray. Pt states he is SI and HI, has consumed ETOH and "$60 worth of crack."
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
## 2016-05-02 ENCOUNTER — Emergency Department: Payer: Self-pay

## 2016-05-02 ENCOUNTER — Other Ambulatory Visit: Payer: Self-pay

## 2016-05-02 ENCOUNTER — Encounter: Payer: Self-pay | Admitting: Emergency Medicine

## 2016-05-02 ENCOUNTER — Emergency Department
Admission: EM | Admit: 2016-05-02 | Discharge: 2016-05-03 | Disposition: A | Discharge status: Home / Self Care | Payer: Self-pay | Attending: Emergency Medicine | Admitting: Emergency Medicine

## 2016-05-02 DIAGNOSIS — R51 Headache: Secondary | ICD-10-CM | POA: Insufficient documentation

## 2016-05-02 DIAGNOSIS — I10 Essential (primary) hypertension: Secondary | ICD-10-CM | POA: Insufficient documentation

## 2016-05-02 DIAGNOSIS — R110 Nausea: Secondary | ICD-10-CM | POA: Insufficient documentation

## 2016-05-02 DIAGNOSIS — F1410 Cocaine abuse, uncomplicated: Secondary | ICD-10-CM | POA: Insufficient documentation

## 2016-05-02 DIAGNOSIS — R0789 Other chest pain: Secondary | ICD-10-CM | POA: Insufficient documentation

## 2016-05-02 DIAGNOSIS — F1010 Alcohol abuse, uncomplicated: Secondary | ICD-10-CM | POA: Insufficient documentation

## 2016-05-02 DIAGNOSIS — Z5181 Encounter for therapeutic drug level monitoring: Secondary | ICD-10-CM | POA: Insufficient documentation

## 2016-05-02 DIAGNOSIS — F17200 Nicotine dependence, unspecified, uncomplicated: Secondary | ICD-10-CM | POA: Insufficient documentation

## 2016-05-02 MED ORDER — SODIUM CHLORIDE 0.9 % IV BOLUS (SEPSIS)
500.0000 mL | Freq: Once | INTRAVENOUS | Status: AC
  Administered 2016-05-03: 500 mL via INTRAVENOUS

## 2016-05-02 MED ORDER — LORAZEPAM 2 MG/ML IJ SOLN
1.0000 mg | Freq: Once | INTRAMUSCULAR | Status: AC
  Administered 2016-05-03: 1 mg via INTRAVENOUS
  Filled 2016-05-02: qty 1

## 2016-05-02 NOTE — ED Notes (Signed)
Pt. States left and right sided chest pain that radiates to rt. Side of head that started at around 9 pm tonight.  Pt. Was given 324 ASA and 1 sublingual nitro.  Pt. States he has HA.

## 2016-05-02 NOTE — ED Triage Notes (Signed)
Pt. Chest pain for the past two hours.  Pt. States chest pain that radiates from rt. And lt. Side of chest to rt. Side of head.  Pt. Has ETOH on board and states recent use of cocaine.

## 2016-05-02 NOTE — ED Provider Notes (Signed)
[facility] Emergency Department Provider Note   ____________________________________________   First MD Initiated Contact with Patient 05/02/16 2321     (approximate)  I have reviewed the triage vital signs and the nursing notes.   HISTORY  Chief Complaint Chest Pain (Pt. arrrived to ED with complaint of lt. sided chest pain.  Pt. given 324 ASA and 1 sublingual nitro.) and Alcohol Intoxication (Pt. drinking ETOH when EMS arrived, pt. states hx of cocaine use.)    HPI Jose [name] is a 55 y.o. male presents to the ED from home via EMS with a chief complain of chest pain. Patient has a history of alcohol and cocaine abuse who states his chest and head began to hurt approximately 2 hours prior to arrival. Last use of cocaine and alcohol earlier today. Complains of right sided chest pain radiating to his right head associated with mild nausea. Denies associated diaphoresis, vomiting, dizziness, palpitations.Denies recent fever, chills, shortness of breath, abdominal pain, diarrhea. Denies recent travel or trauma. Nothing makes his symptoms better or worse.   Past Medical History:  Diagnosis Date  . Hep C w/o coma, chronic (HCC)   . Hypertension   . Polysubstance abuse     Patient Active Problem List   Diagnosis Date Noted  . 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 Substance induced mood disorder (HCC) 11/03/2015  . Alcohol use disorder, severe, dependence (HCC) 10/24/2015  . Alcohol withdrawal (HCC) 10/24/2015  . Stimulant use disorder (HCC) (cocaine) 10/24/2015  . Tobacco use disorder 10/24/2015  . Hepatitis C 09/06/2015    No past surgical history on file.  Prior to Admission medications   Not on File    Allergies Review of patient's allergies indicates no known allergies.  History reviewed. No  pertinent family history.  Social History Social History  Substance Use Topics  . Smoking status: Current Every Day Smoker    Packs/day: 0.50    Years: 45.00  . Smokeless tobacco: Never Used  . Alcohol use 36.0 oz/week    60 Cans of beer per week     Comment: 12 pack per day    Review of Systems  Constitutional: No fever/chills. Eyes: No visual changes. ENT: No sore throat. Cardiovascular: Positive for chest pain. Respiratory: Denies shortness of breath. Gastrointestinal: No abdominal pain.  Positive for nausea, no vomiting.  No diarrhea.  No constipation. Genitourinary: Negative for dysuria. Musculoskeletal: Negative for back pain. Skin: Negative for rash. Neurological: Positive for headache. Negative for focal weakness or numbness.  10-point ROS otherwise negative.  ____________________________________________   PHYSICAL EXAM:  VITAL SIGNS: ED Triage Vitals [05/02/16 2312]  Enc Vitals Group     BP      Pulse      Resp      Temp      Temp src      SpO2 94 %     Weight      Height      Head Circumference      Peak Flow      Pain Score      Pain Loc      Pain Edu?      Excl. in GC?     Constitutional: Alert and oriented. Well appearing and in no acute distress. Eyes: Conjunctivae are normal. PERRL. EOMI. Head: Atraumatic. Nose: No congestion/rhinnorhea. Mouth/Throat: Mucous membranes are moist.  Oropharynx non-erythematous.  Neck: No stridor.   Cardiovascular: Normal rate, regular rhythm. Grossly normal heart sounds.  Good peripheral circulation. Respiratory: Normal respiratory effort.  No retractions. Lungs CTAB. Anterior chest wall tender to palpation. Gastrointestinal: Soft and nontender. No distention. No abdominal bruits. No CVA tenderness. Musculoskeletal: No lower extremity tenderness nor edema.  No joint effusions. Neurologic:  Normal speech and language. No gross focal neurologic deficits are appreciated. No gait instability. Skin:  Skin is warm,  dry and intact. No rash noted. Psychiatric: Mood and affect are normal. Speech and behavior are normal.  ____________________________________________   LABS (all labs ordered are listed, but only abnormal results are displayed)  Labs Reviewed  CBC WITH DIFFERENTIAL/PLATELET - Abnormal; Notable for the following:       Result Value   RBC 4.37 (*)    Platelets 105 (*)    All other components within normal limits  COMPREHENSIVE METABOLIC PANEL - Abnormal; Notable for the following:    AST 70 (*)    All other components within normal limits  ETHANOL - Abnormal; Notable for the following:    Alcohol, Ethyl (B) 232 (*)    All other components within normal limits  URINE DRUG SCREEN, QUALITATIVE ([facility] ONLY) - Abnormal; Notable for the following:    Cocaine Metabolite,Ur [place] POSITIVE (*)    All other components within normal limits  LIPASE, BLOOD  TROPONIN I  CK  TROPONIN I   ____________________________________________  EKG  ED ECG REPORT I, SUNG,JADE J, the attending physician, personally viewed and interpreted this ECG.   Date: 05/02/2016  EKG Time: 2347  Rate: 94  Rhythm: normal EKG, normal sinus rhythm  Axis: Normal  Intervals:right bundle branch block  ST&T Change: Nonspecific No significant change from 11/20/2015 ____________________________________________  RADIOLOGY  Dg Chest 2 View  Result Date: 05/03/2016 CLINICAL DATA:  Acute chest pain for 2 hours. EXAM: CHEST  2 VIEW COMPARISON:  11/20/2015 and prior chest radiographs dating back to 12/14/2012 FINDINGS: The cardiomediastinal silhouette is unremarkable. There is no evidence of focal airspace disease, pulmonary edema, suspicious pulmonary nodule/mass, pleural effusion, or pneumothorax. No acute bony abnormalities are identified. Remote left rib fractures again noted. IMPRESSION: No active cardiopulmonary disease. Electronically Signed   By: Harmon Pier M.D.   On: 05/03/2016 00:05   Ct Head Wo Contrast  Result  Date: 05/03/2016 CLINICAL DATA:  55 year old male with acute headache. EXAM: CT HEAD WITHOUT CONTRAST TECHNIQUE: Contiguous axial images were obtained from the base of the skull through the vertex without intravenous contrast. COMPARISON:  11/12/2015 head CT FINDINGS: Mild generalized cerebral volume loss and chronic small-vessel white matter ischemic changes again noted. No acute intracranial abnormalities are identified, including mass lesion or mass effect, hydrocephalus, extra-axial fluid collection, midline shift, hemorrhage, or acute infarction. The visualized bony calvarium is unremarkable. IMPRESSION: No evidence of acute intracranial abnormality. Mild atrophy and chronic small-vessel white matter ischemic changes. Electronically Signed   By: Harmon Pier M.D.   On: 05/03/2016 00:06    ____________________________________________   PROCEDURES  Procedure(s) performed: None  Procedures  Critical Care performed: No  ____________________________________________   INITIAL IMPRESSION / ASSESSMENT AND PLAN / ED COURSE  Pertinent labs & imaging results that were available during my care of the patient were reviewed by me and considered in my medical decision making (see chart for details).  55 year old male with a history of polysubstance abuse who presents with chest pain and headache in the setting of cocaine and alcohol use. Will check screening lab work  including CT head, CK; repeat times troponin. We'll administer IV Ativan for relief of patient's symptoms.  Clinical Course  Comment By Time  Patient sleeping soundly. Laboratory and urinalysis results remarkable for elevated EtOH level and positive cocaine metabolites. Will repeat timed troponin and reassess. Irean HongJade J Sung, MD 08/10 0113  Repeat troponin was negative. As patient is homeless, will house patient overnight and discharge in the morning. Irean HongJade J Sung, MD 08/10 [phone]     ____________________________________________   FINAL  CLINICAL IMPRESSION(S) / ED DIAGNOSES  Final diagnoses:  Alcohol abuse  Atypical chest pain  Chest wall pain  Cocaine abuse      NEW MEDICATIONS STARTED DURING THIS VISIT:  There are no discharge medications for this patient.    Note:  This document was prepared using Dragon voice recognition software and may include unintentional dictation errors.    Irean HongJade J Sung, MD 05/08/16 [id number]

## 2016-05-03 LAB — COMPREHENSIVE METABOLIC PANEL
ALK PHOS: 76 U/L (ref 38–126)
ALT: 53 U/L (ref 17–63)
ANION GAP: 10 (ref 5–15)
AST: 70 U/L — ABNORMAL HIGH (ref 15–41)
Albumin: 4.4 g/dL (ref 3.5–5.0)
BILIRUBIN TOTAL: 0.4 mg/dL (ref 0.3–1.2)
BUN: 8 mg/dL (ref 6–20)
CALCIUM: 9.4 mg/dL (ref 8.9–10.3)
CO2: 23 mmol/L (ref 22–32)
CREATININE: 0.63 mg/dL (ref 0.61–1.24)
Chloride: 107 mmol/L (ref 101–111)
Glucose, Bld: 91 mg/dL (ref 65–99)
Potassium: 3.6 mmol/L (ref 3.5–5.1)
SODIUM: 140 mmol/L (ref 135–145)
TOTAL PROTEIN: 7.7 g/dL (ref 6.5–8.1)

## 2016-05-03 LAB — URINE DRUG SCREEN, QUALITATIVE (ARMC ONLY)
AMPHETAMINES, UR SCREEN: NOT DETECTED
BARBITURATES, UR SCREEN: NOT DETECTED
Benzodiazepine, Ur Scrn: NOT DETECTED
CANNABINOID 50 NG, UR ~~LOC~~: NOT DETECTED
Cocaine Metabolite,Ur ~~LOC~~: POSITIVE — AB
MDMA (Ecstasy)Ur Screen: NOT DETECTED
Methadone Scn, Ur: NOT DETECTED
OPIATE, UR SCREEN: NOT DETECTED
PHENCYCLIDINE (PCP) UR S: NOT DETECTED
Tricyclic, Ur Screen: NOT DETECTED

## 2016-05-03 LAB — CBC WITH DIFFERENTIAL/PLATELET
Basophils Absolute: 0 10*3/uL (ref 0–0.1)
Basophils Relative: 1 %
EOS ABS: 0 10*3/uL (ref 0–0.7)
Eosinophils Relative: 1 %
HCT: 40.6 % (ref 40.0–52.0)
HEMOGLOBIN: 14.2 g/dL (ref 13.0–18.0)
LYMPHS ABS: 2.4 10*3/uL (ref 1.0–3.6)
LYMPHS PCT: 38 %
MCH: 32.5 pg (ref 26.0–34.0)
MCHC: 35 g/dL (ref 32.0–36.0)
MCV: 92.9 fL (ref 80.0–100.0)
MONOS PCT: 7 %
Monocytes Absolute: 0.4 10*3/uL (ref 0.2–1.0)
NEUTROS PCT: 53 %
Neutro Abs: 3.4 10*3/uL (ref 1.4–6.5)
Platelets: 105 10*3/uL — ABNORMAL LOW (ref 150–440)
RBC: 4.37 MIL/uL — ABNORMAL LOW (ref 4.40–5.90)
RDW: 14.1 % (ref 11.5–14.5)
WBC: 6.3 10*3/uL (ref 3.8–10.6)

## 2016-05-03 LAB — TROPONIN I: Troponin I: <0.03 ng/mL (ref <0.03)

## 2016-05-03 LAB — LIPASE, BLOOD: Lipase: 33 U/L (ref 11–51)

## 2016-05-03 LAB — ETHANOL: ALCOHOL ETHYL (B): 232 mg/dL — AB (ref <5)

## 2016-05-03 LAB — CK: Total CK: 175 U/L (ref 49–397)

## 2016-05-03 MED ORDER — SODIUM CHLORIDE 0.9 % IV BOLUS (SEPSIS)
1000.0000 mL | Freq: Once | INTRAVENOUS | Status: AC
  Administered 2016-05-03: 1000 mL via INTRAVENOUS

## 2016-05-03 MED ORDER — ACETAMINOPHEN 500 MG PO TABS
1000.0000 mg | ORAL_TABLET | Freq: Once | ORAL | Status: AC
  Administered 2016-05-03: 1000 mg via ORAL
  Filled 2016-05-03: qty 2

## 2016-05-03 NOTE — Discharge Instructions (Signed)
1. Stop using illicit drugs. 2. Drink alcohol only in moderation. 3. Return to the ER for worsening symptoms, persistent vomiting, difficulty breathing or other concerns.

## 2016-08-22 IMAGING — CR DG CHEST 2V
1 series · 2 of 2 positions shown · non-contrast
Comparison: Chest radiograph performed 09/30/2014

CLINICAL DATA: Chronic cough and sore throat.  Initial encounter.

EXAM:
CHEST  2 VIEW

[Series 1: w chest pa · 0.14mm/px · 2 of 2 slices shown]
[im 1/2]
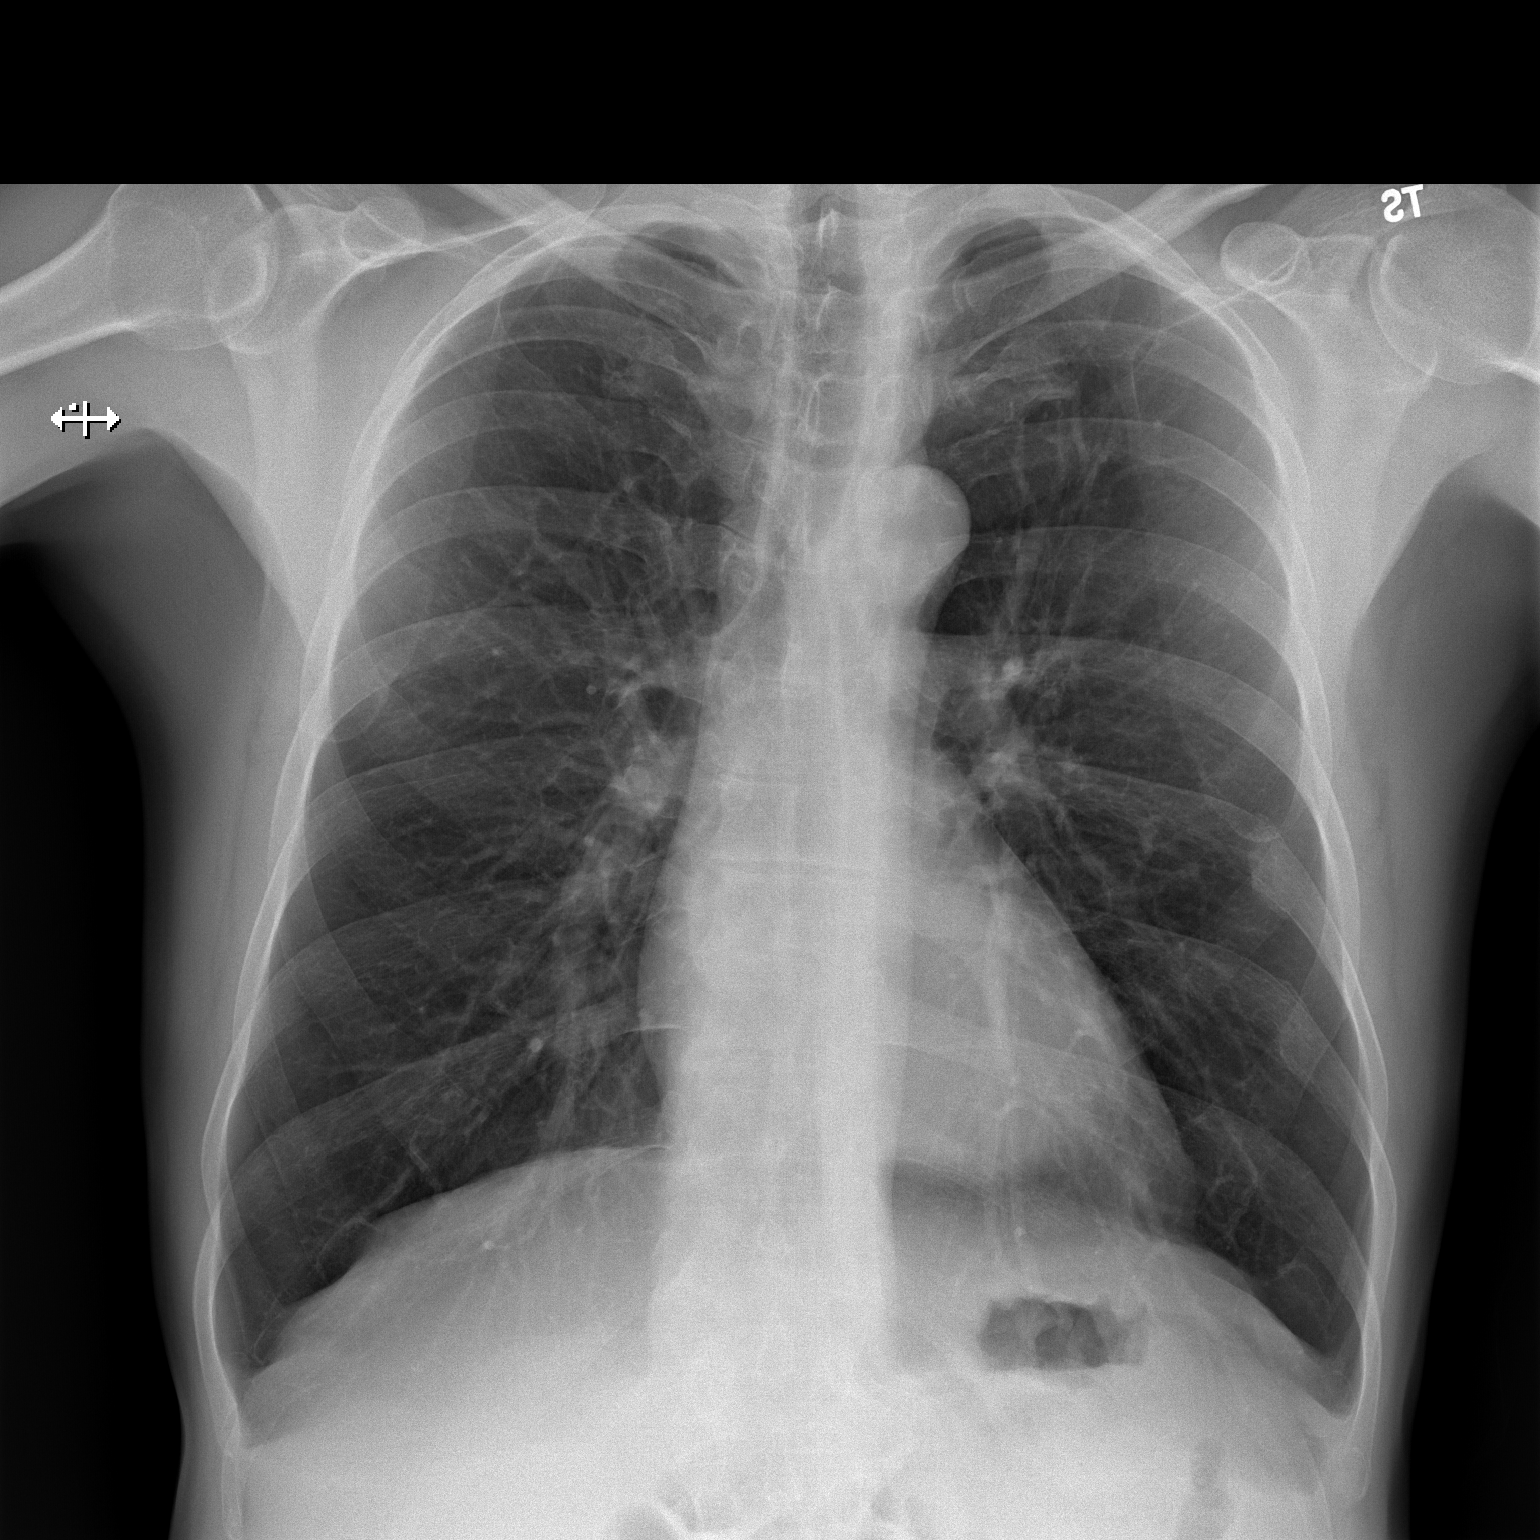
[im 2/2]
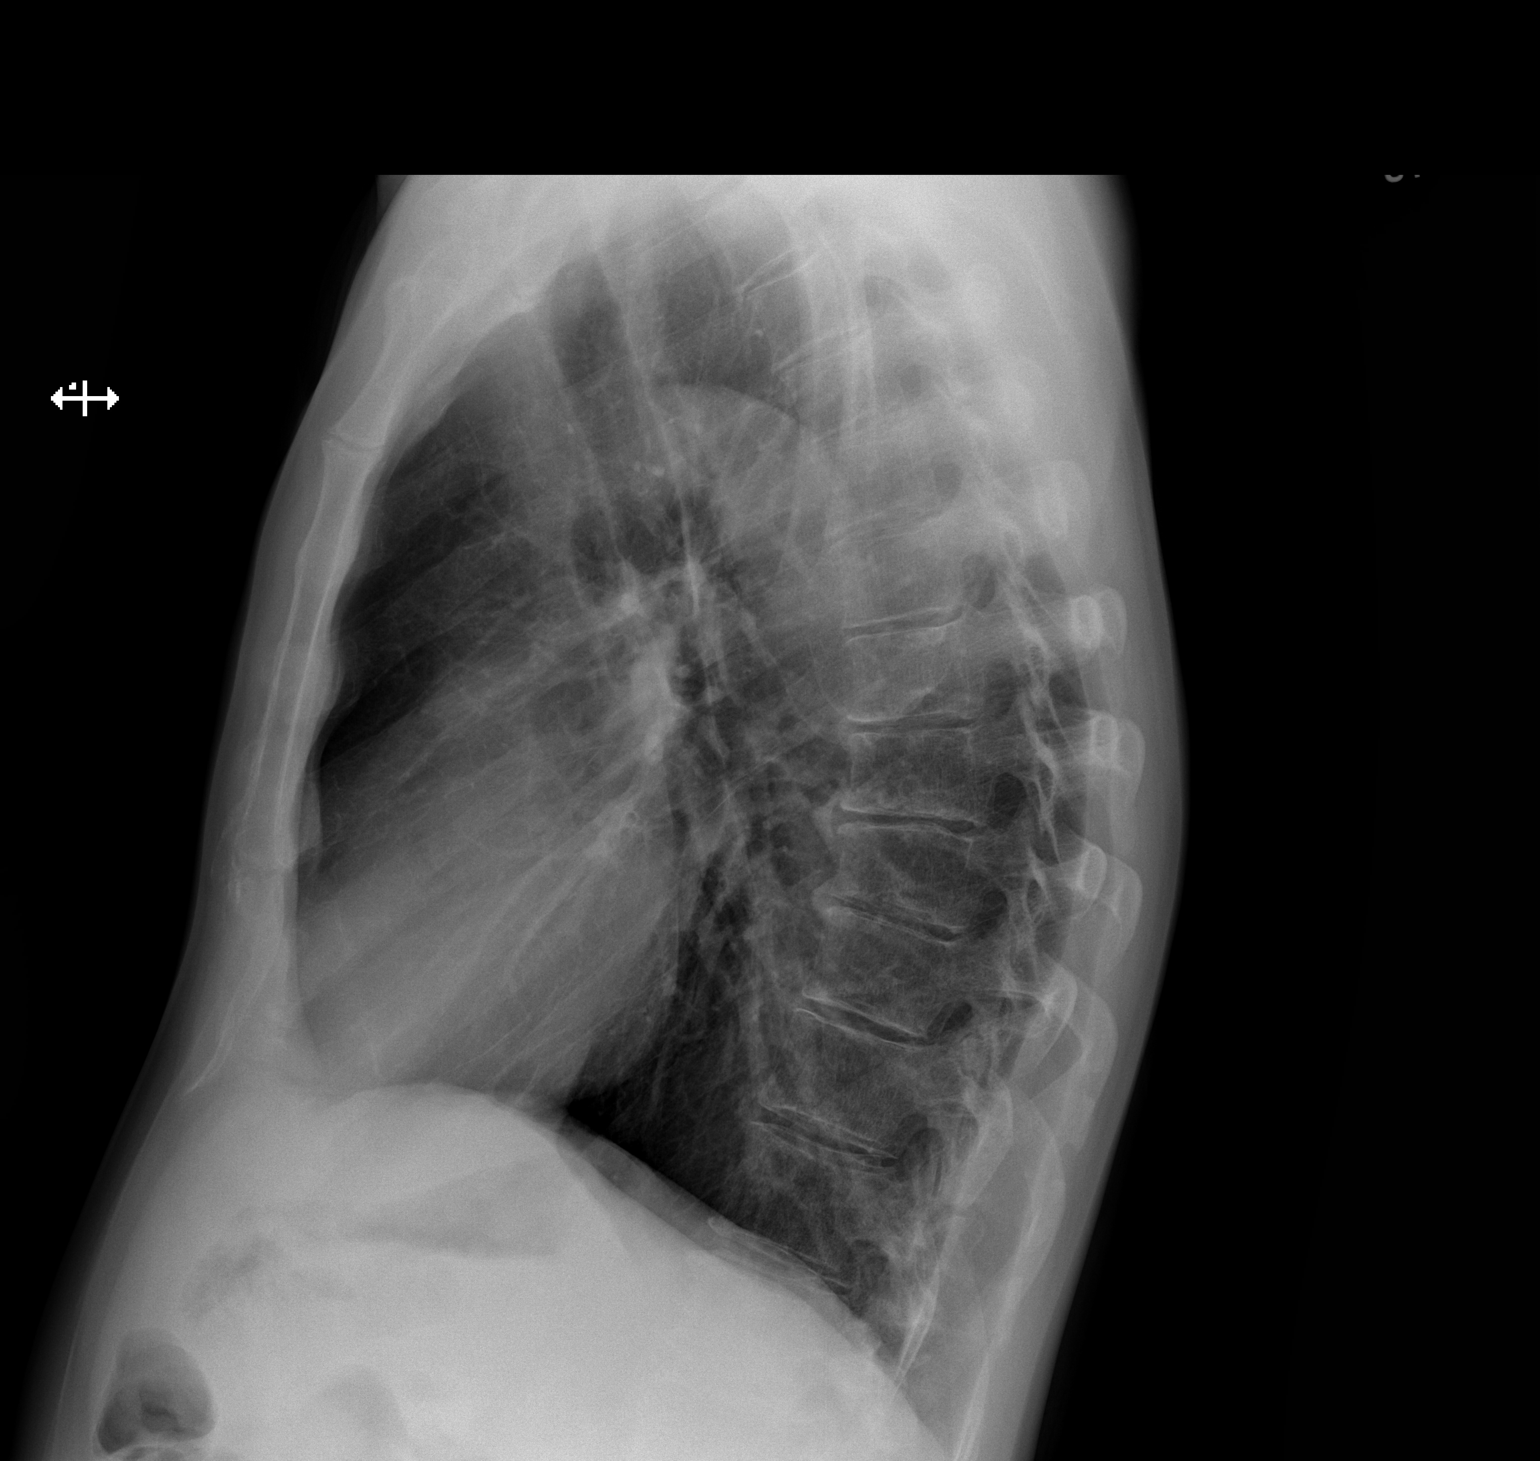

[2 of 2 positions shown; findings below may reference images not displayed]

FINDINGS: The lungs are well-aerated and clear. There is no evidence of focal
opacification, pleural effusion or pneumothorax.

The heart is normal in size; the mediastinal contour is within
normal limits. No acute osseous abnormalities are seen. Mild chronic
left-sided rib deformities are noted.
IMPRESSION: No acute cardiopulmonary process seen.

## 2016-09-06 IMAGING — CR DG CHEST 2V
2 series · 2 of 2 positions shown · non-contrast
Comparison: 09/06/2015 chest radiograph.

CLINICAL DATA: Chest pain

EXAM:
CHEST  2 VIEW

[chest pa]
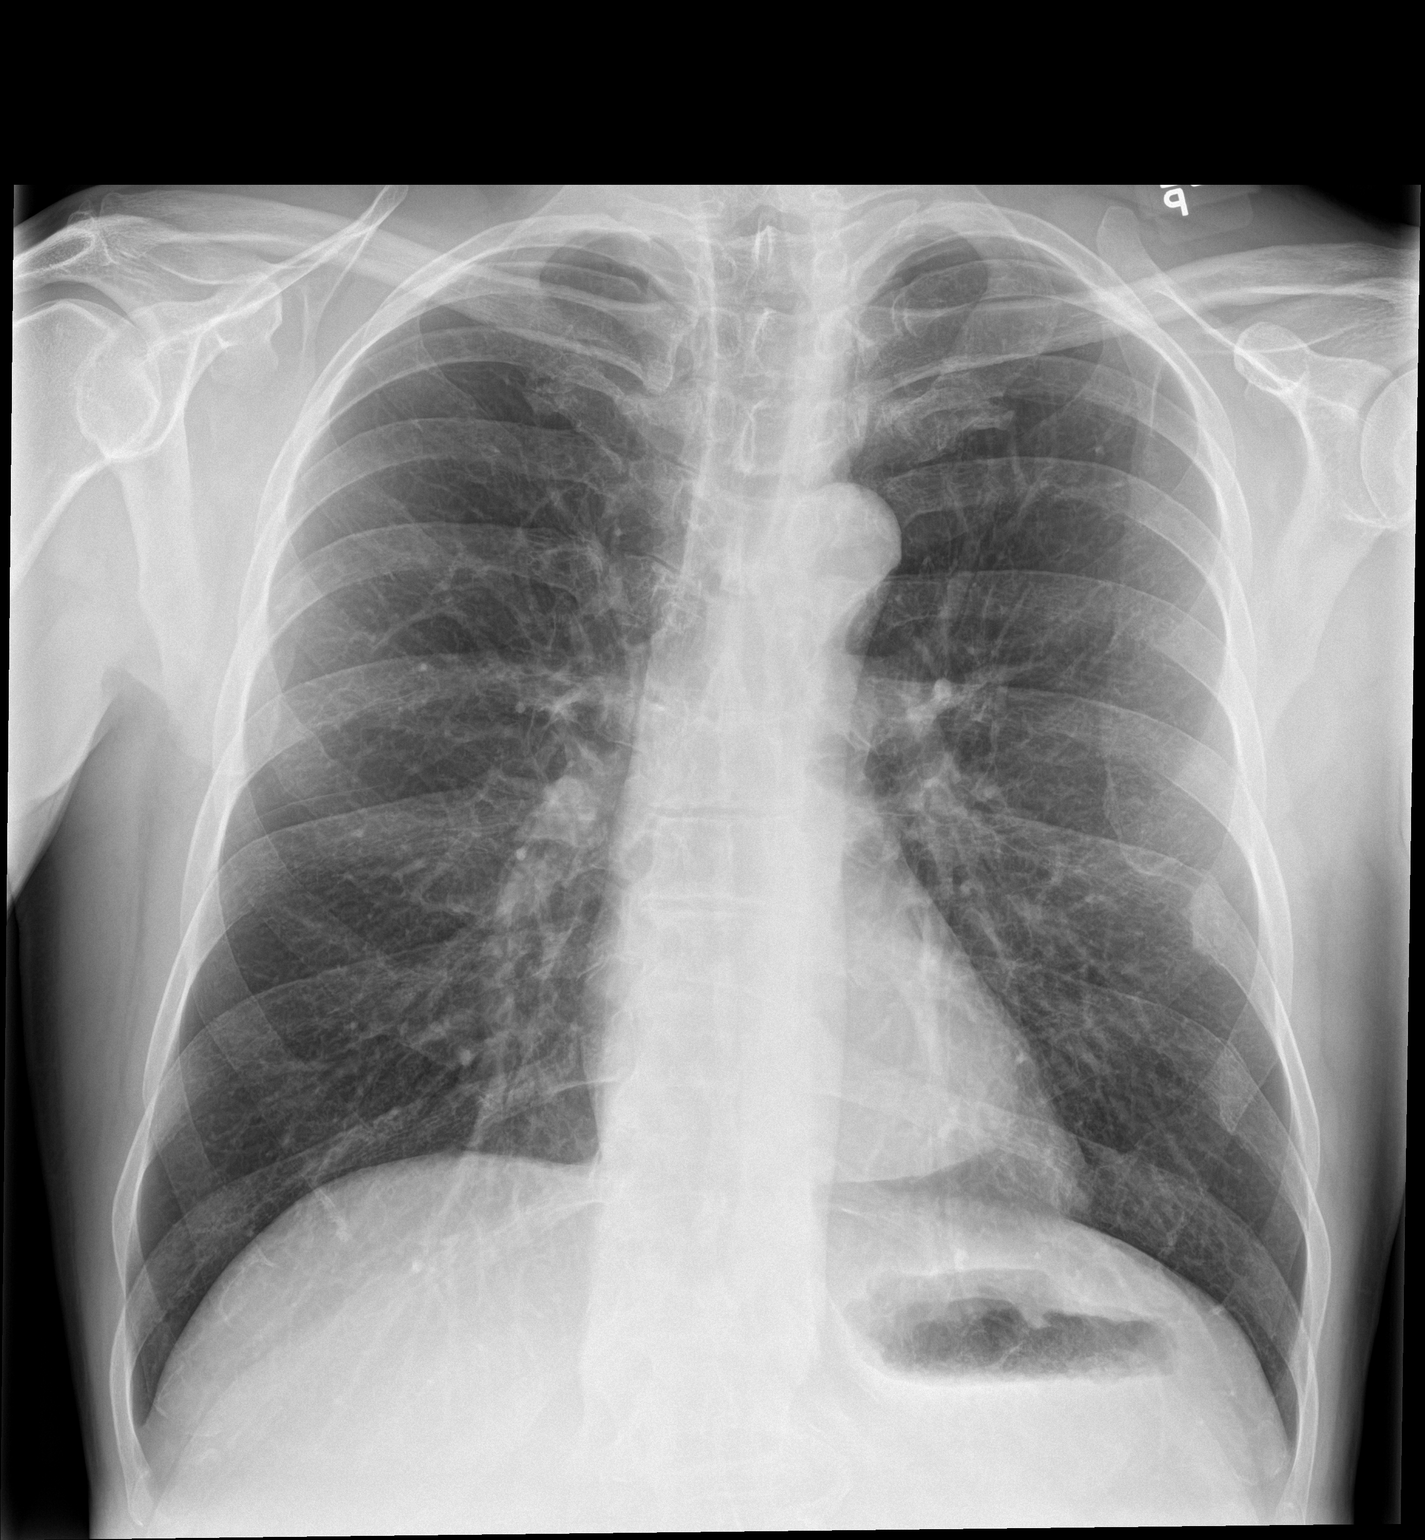

[chest lat]
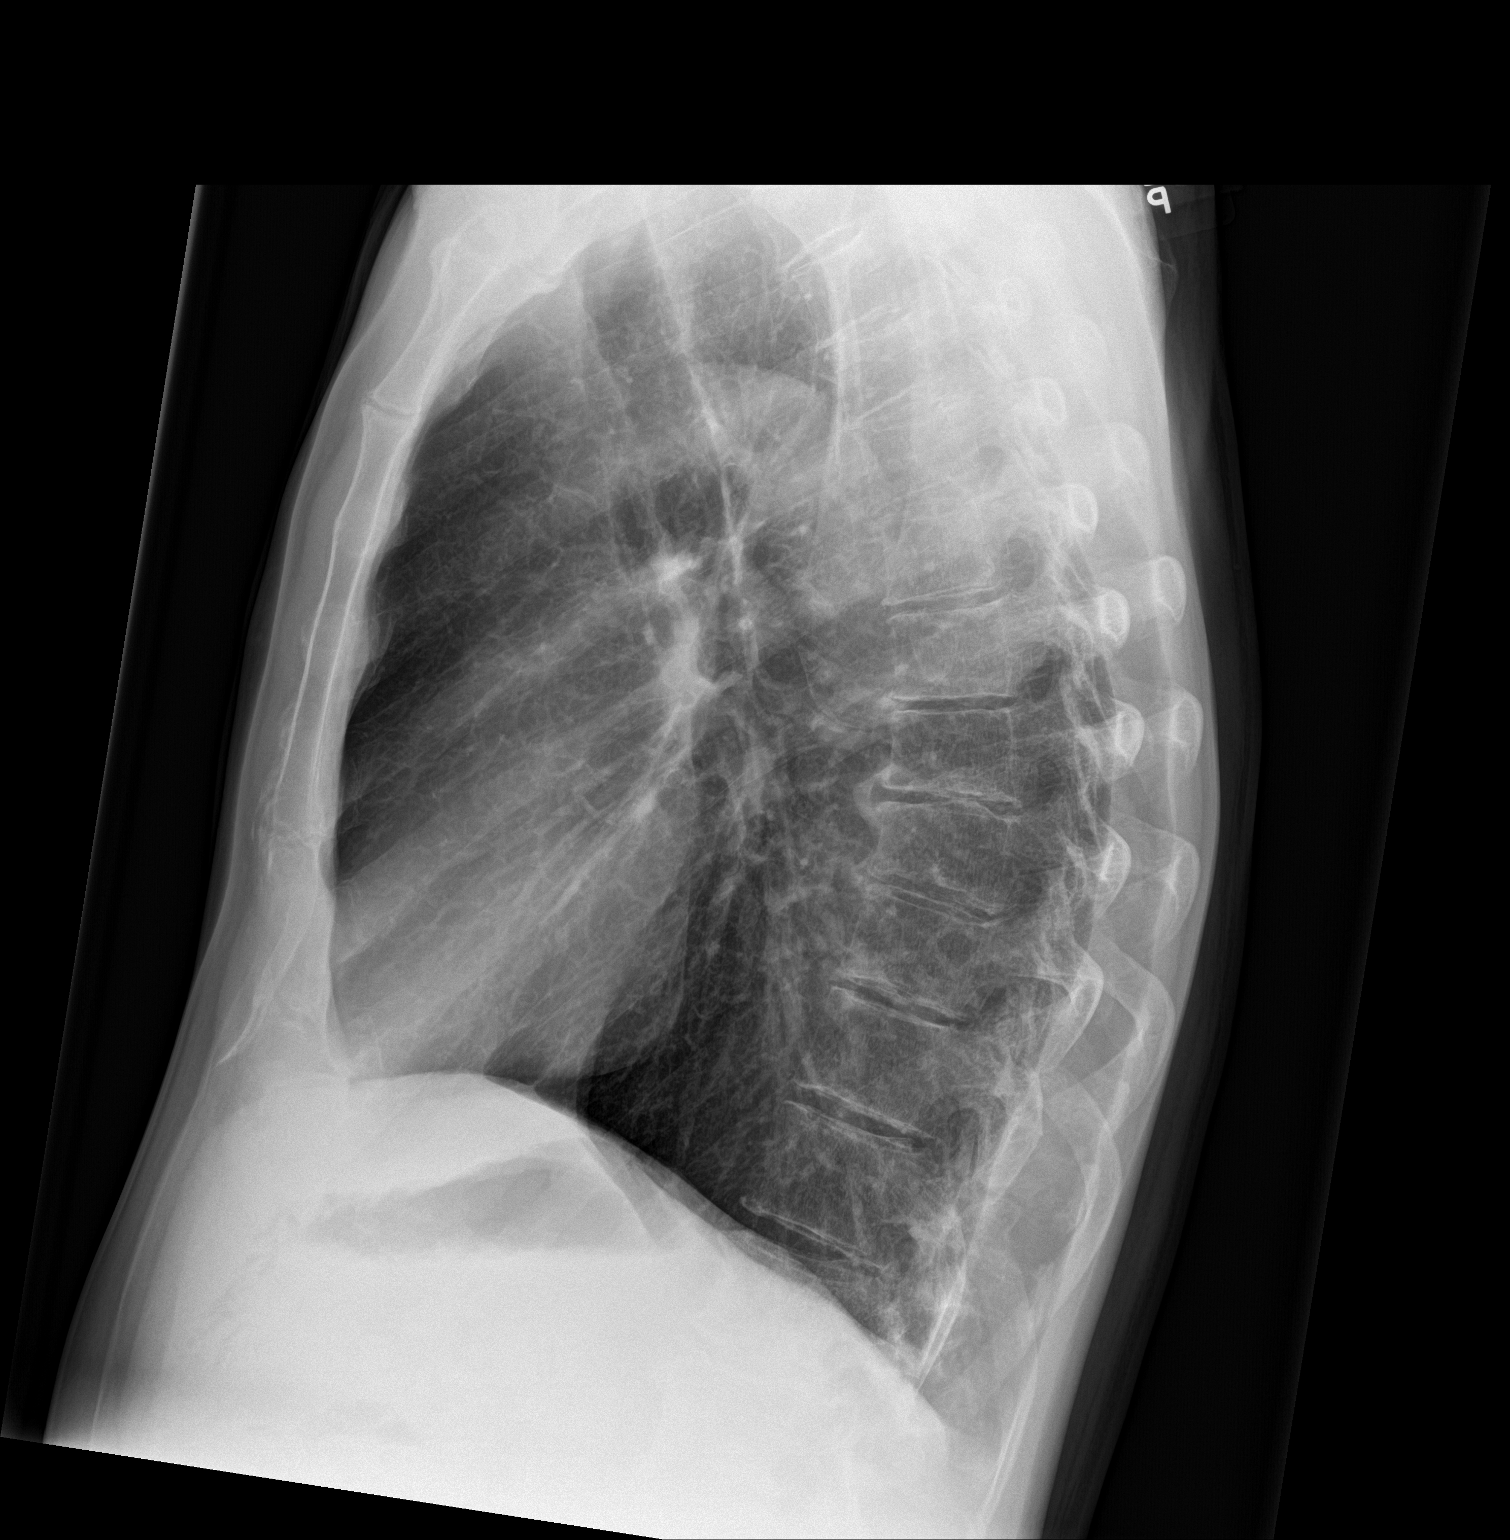

[2 of 2 positions shown; findings below may reference images not displayed]

FINDINGS: Stable cardiomediastinal silhouette with normal heart size. No
pneumothorax. No pleural effusion. Clear lungs, with no focal lung
consolidation and no pulmonary edema.
IMPRESSION: No active cardiopulmonary disease.
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
## 2017-01-12 ENCOUNTER — Emergency Department
Admission: EM | Admit: 2017-01-12 | Discharge: 2017-01-12 | Disposition: A | Discharge status: Home / Self Care | Payer: Self-pay | Attending: Emergency Medicine | Admitting: Emergency Medicine

## 2017-01-12 ENCOUNTER — Emergency Department: Payer: Self-pay

## 2017-01-12 DIAGNOSIS — R079 Chest pain, unspecified: Secondary | ICD-10-CM

## 2017-01-12 DIAGNOSIS — Z5181 Encounter for therapeutic drug level monitoring: Secondary | ICD-10-CM | POA: Insufficient documentation

## 2017-01-12 DIAGNOSIS — F17200 Nicotine dependence, unspecified, uncomplicated: Secondary | ICD-10-CM | POA: Insufficient documentation

## 2017-01-12 DIAGNOSIS — F10920 Alcohol use, unspecified with intoxication, uncomplicated: Secondary | ICD-10-CM

## 2017-01-12 DIAGNOSIS — F10120 Alcohol abuse with intoxication, uncomplicated: Secondary | ICD-10-CM | POA: Insufficient documentation

## 2017-01-12 DIAGNOSIS — F1410 Cocaine abuse, uncomplicated: Secondary | ICD-10-CM | POA: Insufficient documentation

## 2017-01-12 DIAGNOSIS — I10 Essential (primary) hypertension: Secondary | ICD-10-CM | POA: Insufficient documentation

## 2017-01-12 DIAGNOSIS — R0789 Other chest pain: Secondary | ICD-10-CM | POA: Insufficient documentation

## 2017-01-12 LAB — URINE DRUG SCREEN, QUALITATIVE (ARMC ONLY)
Amphetamines, Ur Screen: NOT DETECTED
Barbiturates, Ur Screen: NOT DETECTED
Benzodiazepine, Ur Scrn: NOT DETECTED
CANNABINOID 50 NG, UR ~~LOC~~: NOT DETECTED
COCAINE METABOLITE, UR ~~LOC~~: POSITIVE — AB
MDMA (ECSTASY) UR SCREEN: NOT DETECTED
Methadone Scn, Ur: NOT DETECTED
Opiate, Ur Screen: NOT DETECTED
Phencyclidine (PCP) Ur S: NOT DETECTED
TRICYCLIC, UR SCREEN: NOT DETECTED

## 2017-01-12 LAB — CBC
HEMATOCRIT: 45.7 % (ref 40.0–52.0)
HEMOGLOBIN: 15.7 g/dL (ref 13.0–18.0)
MCH: 32.4 pg (ref 26.0–34.0)
MCHC: 34.4 g/dL (ref 32.0–36.0)
MCV: 94.1 fL (ref 80.0–100.0)
Platelets: 140 10*3/uL — ABNORMAL LOW (ref 150–440)
RBC: 4.85 MIL/uL (ref 4.40–5.90)
RDW: 13.5 % (ref 11.5–14.5)
WBC: 5.7 10*3/uL (ref 3.8–10.6)

## 2017-01-12 LAB — TROPONIN I
Troponin I: <0.03 ng/mL (ref <0.03)
Troponin I: <0.03 ng/mL (ref <0.03)

## 2017-01-12 LAB — BASIC METABOLIC PANEL
ANION GAP: 9 (ref 5–15)
BUN: 11 mg/dL (ref 6–20)
CHLORIDE: 103 mmol/L (ref 101–111)
CO2: 27 mmol/L (ref 22–32)
Calcium: 9.3 mg/dL (ref 8.9–10.3)
Creatinine, Ser: 0.75 mg/dL (ref 0.61–1.24)
GFR calc Af Amer: >60 mL/min (ref >60)
GLUCOSE: 89 mg/dL (ref 65–99)
POTASSIUM: 3.8 mmol/L (ref 3.5–5.1)
Sodium: 139 mmol/L (ref 135–145)

## 2017-01-12 LAB — ETHANOL: Alcohol, Ethyl (B): 105 mg/dL — ABNORMAL HIGH (ref <5)

## 2017-01-12 LAB — LIPASE, BLOOD: LIPASE: 36 U/L (ref 11–51)

## 2017-01-12 MED ORDER — KETOROLAC TROMETHAMINE 30 MG/ML IJ SOLN
30.0000 mg | Freq: Once | INTRAMUSCULAR | Status: AC
  Administered 2017-01-12: 30 mg via INTRAVENOUS
  Filled 2017-01-12: qty 1

## 2017-01-12 NOTE — ED Triage Notes (Signed)
Pt bib EMS from home w/ c/o CP.  Pt sts that he has had intermittent CP x 1 month, began today after arguing w/ girlfriend. Pt A/OX4, resp even and unlabored, NAD.  Pt sts that he has been "sick to my stomach" x 1 month. Pt sts that he feels like he has cancer. Pt sts that he has ETOH and cocaine use today.  Sts that drug/ETOH use normal, estimates 10 beers today.

## 2017-01-12 NOTE — ED Notes (Signed)
Pt verbalized understanding of discharge instructions. NAD at this time. 

## 2017-01-12 NOTE — ED Provider Notes (Signed)
[facility] Emergency Department Provider Note    First MD Initiated Contact with Patient 01/12/17 [phone]     (approximate)  I have reviewed the triage vital signs and the nursing notes.   HISTORY  Chief Complaint Chest Pain   HPI Jose [name] is a 56 y.o. male with bolus of chronic medical conditions including polysubstance abuse alcohol and cocaine presents to the emergency department with intermittent nonradiating central chest pain times one month. Patient states that his chest began to hurt tonight while at an apartment with his girlfriend's family. Patient does admit to EtOH and crack cocaine use tonight. Patient also admits to a one-month history of "stomach pain". Patient states current pain score is 9 out of 10. Patient denies any dyspnea does admit to nausea no vomiting.   Past Medical History:  Diagnosis Date  . Hep C w/o coma, chronic (HCC)   . Hypertension   . Polysubstance abuse     Patient Active Problem List   Diagnosis Date Noted  . 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 Substance induced mood disorder (HCC) 11/03/2015  . Alcohol use disorder, severe, dependence (HCC) 10/24/2015  . Alcohol withdrawal (HCC) 10/24/2015  . Stimulant use disorder (HCC) (cocaine) 10/24/2015  . Tobacco use disorder 10/24/2015  . Hepatitis C 09/06/2015    History reviewed. No pertinent surgical history.  Prior to Admission medications   Not on File    Allergies Patient has no known allergies.  No family history on file.  Social History Social History  Substance Use Topics  . Smoking status: Current Every Day Smoker    Packs/day: 0.50    Years: 45.00  . Smokeless tobacco: Never Used  . Alcohol use 36.0 oz/week    60 Cans of beer per week     Comment: 12 pack per day    Review of  Systems Constitutional: No fever/chills Eyes: No visual changes. ENT: No sore throat. Cardiovascular: Denies chest pain. Respiratory: Denies shortness of breath. Gastrointestinal: No abdominal pain.  No nausea, no vomiting.  No diarrhea.  No constipation. Genitourinary: Negative for dysuria. Musculoskeletal: Negative for back pain. Skin: Negative for rash. Neurological: Negative for headaches, focal weakness or numbness. Psychiatric:Positive for alcohol and crack cocaine use tonight  10-point ROS otherwise negative.  ____________________________________________   PHYSICAL EXAM:  VITAL SIGNS: ED Triage Vitals  Enc Vitals Group     BP 01/12/17 0313 (!) 180/104     Pulse Rate 01/12/17 0313 96     Resp 01/12/17 0313 17     Temp 01/12/17 0313 97.9 F (36.6 C)     Temp Source 01/12/17 0313 Oral     SpO2 01/12/17 0313 97 %     Weight 01/12/17 0309 145 lb (65.8 kg)     Height 01/12/17 0309  (1.727 m)     Head Circumference --      Peak Flow --      Pain Score 01/12/17 0308 10     Pain Loc --      Pain Edu? --      Excl. in GC? --     Constitutional: Alert and oriented. Appears intoxicated EtOH on breath Eyes: Conjunctivae are normal. PERRL. EOMI. Head: Atraumatic. Mouth/Throat: Mucous membranes are moist.  Oropharynx non-erythematous. Neck: No stridor.   Cardiovascular: Normal rate, regular rhythm. Good peripheral circulation. Grossly normal heart sounds. Respiratory:  Normal respiratory effort.  No retractions. Lungs CTAB. Gastrointestinal: Epigastric discomfort with palpation. No distention.  Musculoskeletal: No lower extremity tenderness nor edema. No gross deformities of extremities. Neurologic:  Normal speech and language. No gross focal neurologic deficits are appreciated.  Skin:  Skin is warm, dry and intact. No rash noted. Psychiatric: Mood and affect are normal. Speech and behavior are normal.  ____________________________________________   LABS (all labs  ordered are listed, but only abnormal results are displayed)  Labs Reviewed  CBC - Abnormal; Notable for the following:       Result Value   Platelets 140 (*)    All other components within normal limits  URINE DRUG SCREEN, QUALITATIVE ([facility] ONLY) - Abnormal; Notable for the following:    Cocaine Metabolite,Ur [place] POSITIVE (*)    All other components within normal limits  ETHANOL - Abnormal; Notable for the following:    Alcohol, Ethyl (B) 105 (*)    All other components within normal limits  BASIC METABOLIC PANEL  TROPONIN I   ____________________________________________  EKG  ED ECG REPORT I, [place] N Rio Taber, the attending physician, personally viewed and interpreted this ECG.   Date: 01/12/2017  EKG Time: 3:09 AM  Rate: 91  Rhythm: Normal sinus rhythm  Axis: Normal  Intervals: Normal  ST&T Change: None  ____________________________________________  RADIOLOGY I, [place] N Mats Jeanlouis, personally viewed and evaluated these images (plain radiographs) as part of my medical decision making, as well as reviewing the written report by the radiologist.  Dg Chest Port 1 View  Result Date: 01/12/2017 CLINICAL DATA:  Chest pain for 1 month EXAM: PORTABLE CHEST 1 VIEW COMPARISON:  05/02/2016 FINDINGS: The lungs appear slightly hyperinflated. There is no acute infiltrate or effusion. Cardiomediastinal silhouette within normal limits with atherosclerosis. There is no pneumothorax. IMPRESSION: No active disease. Electronically Signed   By: Jasmine Pang M.D.   On: 01/12/2017 03:40     Procedures   ____________________________________________   INITIAL IMPRESSION / ASSESSMENT AND PLAN / ED COURSE  Pertinent labs & imaging results that were available during my care of the patient were reviewed by me and considered in my medical decision making (see chart for details).  56 year old male presenting with epigastric abdominal pain as well as chest pain times one month. Patient does  admit to substance in use tonight. Patient admits to smoking crack cocaine as well as EtOH. Laboratory data unremarkable including troponin. EKG revealed no evidence of ischemia or infarction. We'll repeat troponin and anticipate discharge if that's normal.      ____________________________________________  FINAL CLINICAL IMPRESSION(S) / ED DIAGNOSES  Final diagnoses:  Cocaine abuse  Alcoholic intoxication without complication (HCC)  Chest pain, unspecified type     MEDICATIONS GIVEN DURING THIS VISIT:  Medications  ketorolac (TORADOL) 30 MG/ML injection 30 mg (30 mg Intravenous Given 01/12/17 0445)     NEW OUTPATIENT MEDICATIONS STARTED DURING THIS VISIT:  New Prescriptions   No medications on file    Modified Medications   No medications on file    Discontinued Medications   No medications on file     Note:  This document was prepared using Dragon voice recognition software and may include unintentional dictation errors.    Darci Current, MD 01/12/17 [phone]

## 2017-04-18 IMAGING — CT CT HEAD W/O CM
3 series · 15 of 47 positions shown, 18 images · non-contrast
Comparison: 11/12/2015 head CT

CLINICAL DATA: 54-year-old male with acute headache.

EXAM:
CT HEAD WITHOUT CONTRAST
TECHNIQUE: Contiguous axial images were obtained from the base of the skull
through the vertex without intravenous contrast.

[Series 2: head wo · axial · 0.42mm/px · z∈[+478,+603]mm · 9 of 31 slices shown, 12 images]
[im 3/31  brain]
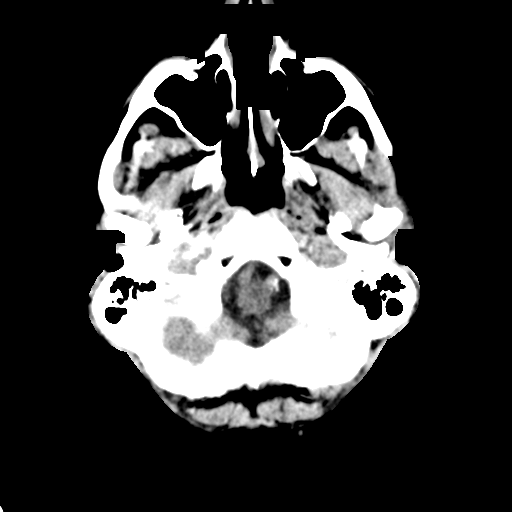
[im 3/31  bone]
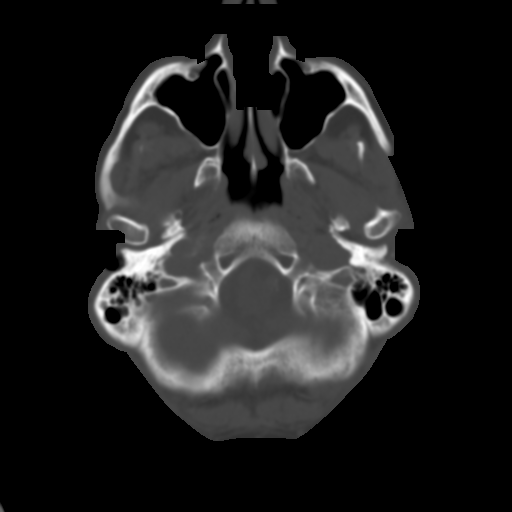
[im 6/31  brain]
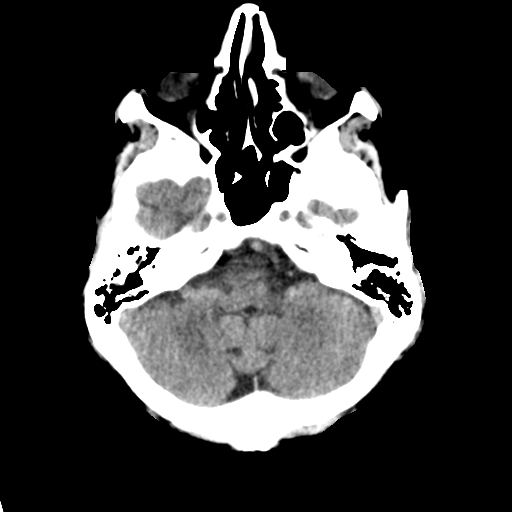
[im 9/31  brain]
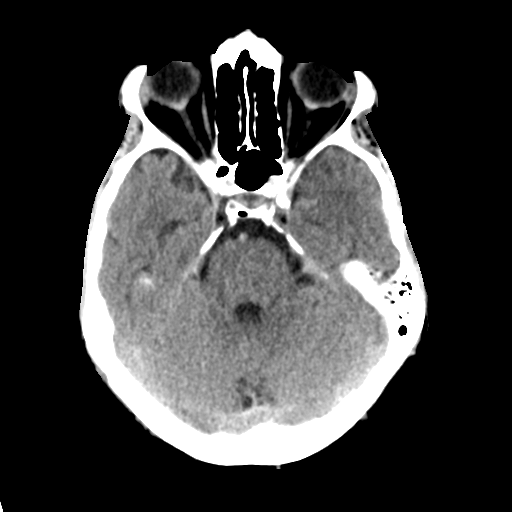
[im 12/31  brain]
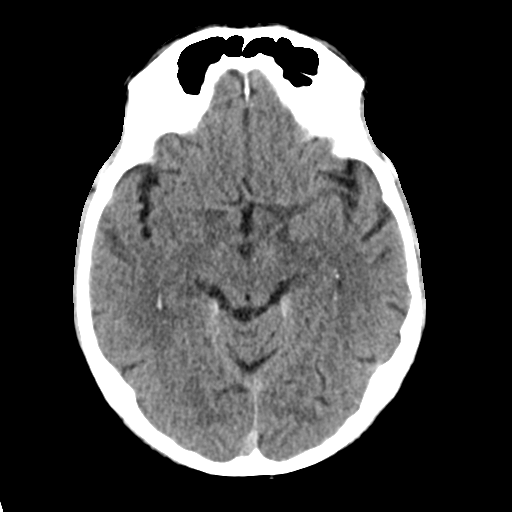
[im 16/31  brain]
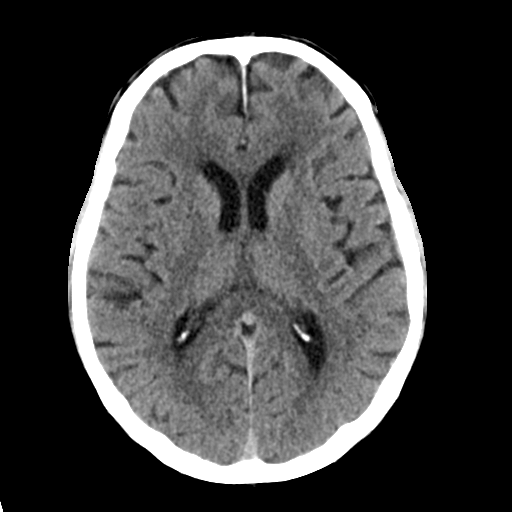
[im 16/31  bone]
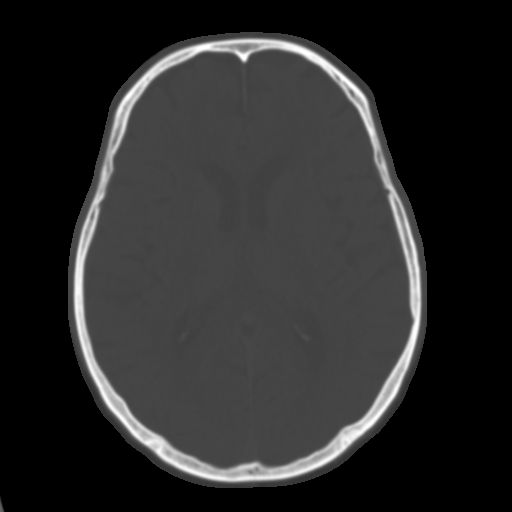
[im 19/31  brain]
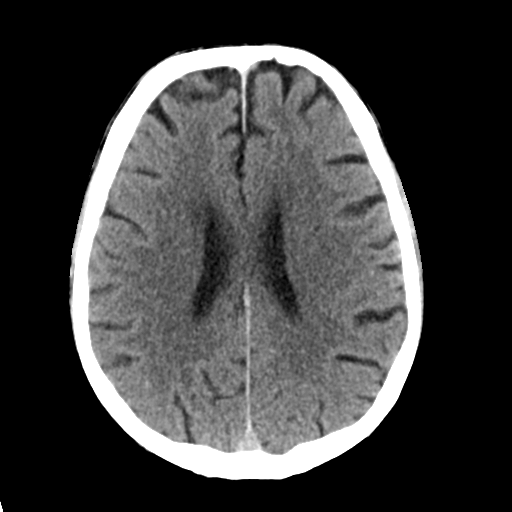
[im 22/31  brain]
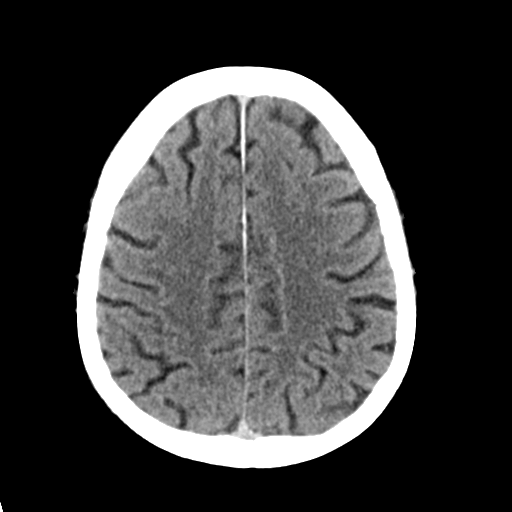
[im 25/31  brain]
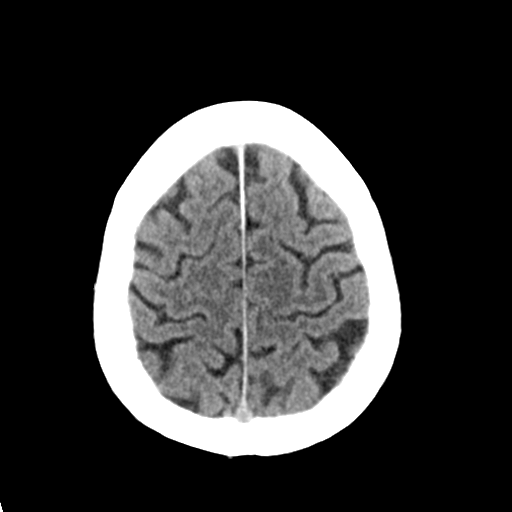
[im 28/31  brain]
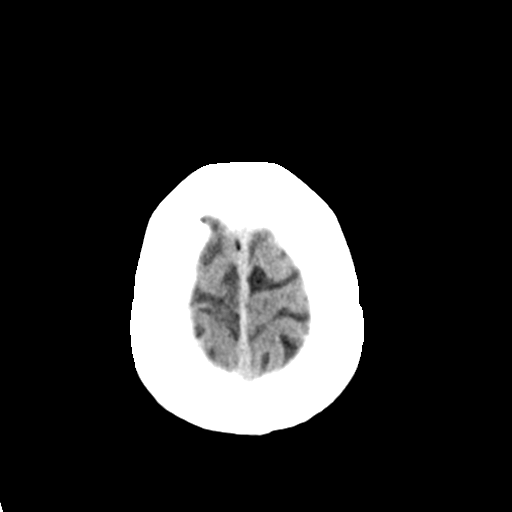
[im 28/31  bone]
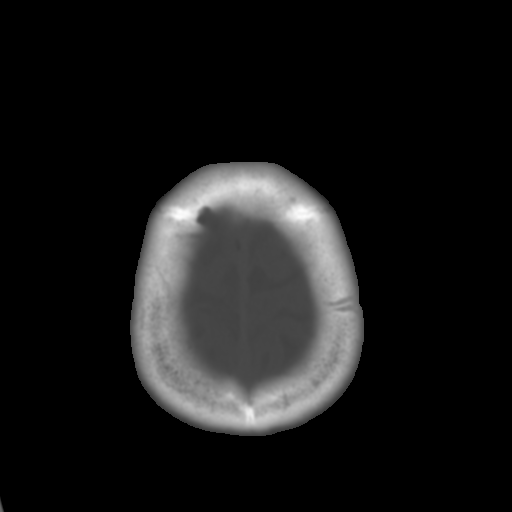

[Series 4: coronal soft tissue · coronal · 0.30mm/px · 3 of 64 slices shown]
[im 22/64  brain]
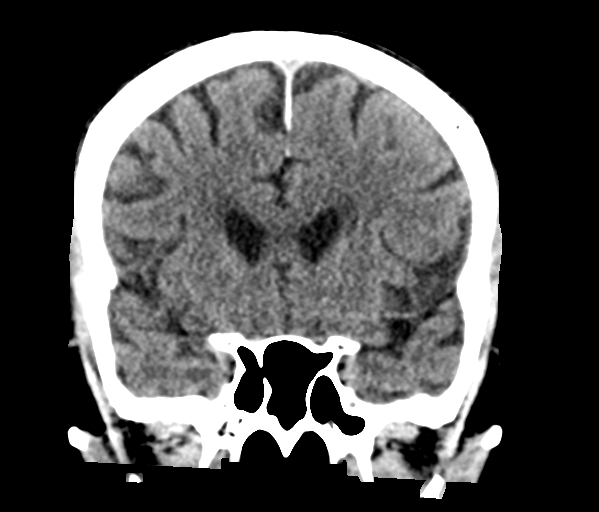
[im 29/64  brain]
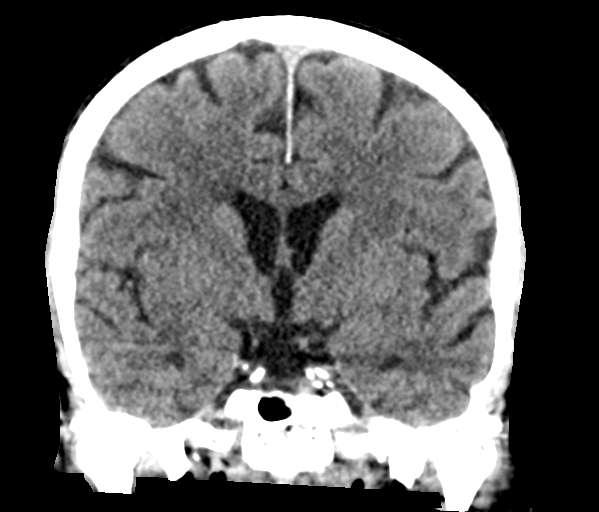
[im 36/64  brain]
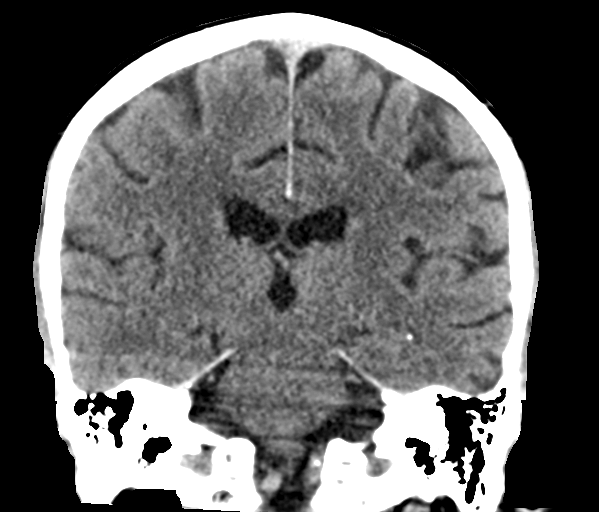

[Series 5: sagittal soft tissue · sagittal · 0.30mm/px · 3 of 55 slices shown]
[im 19/55  brain]
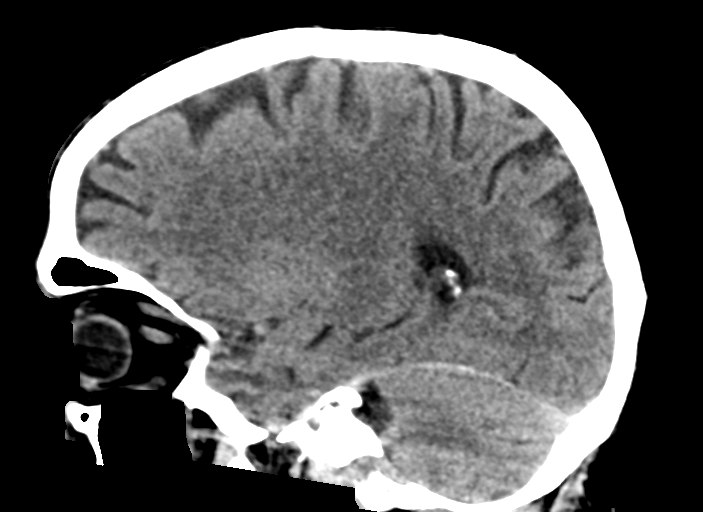
[im 28/55  brain]
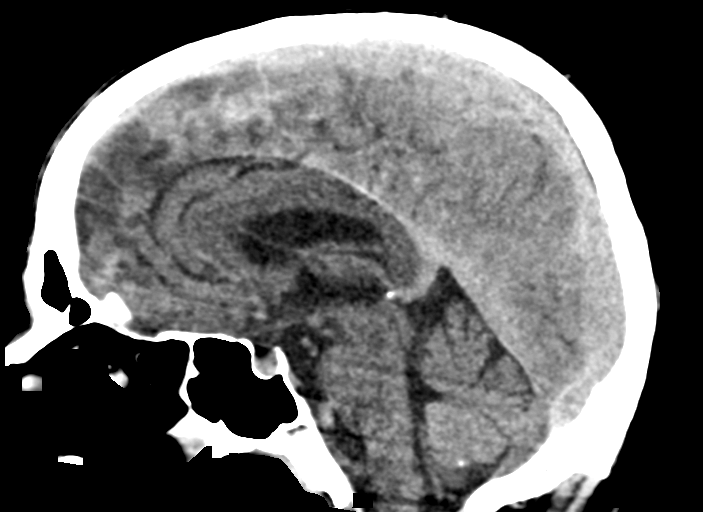
[im 37/55  brain]
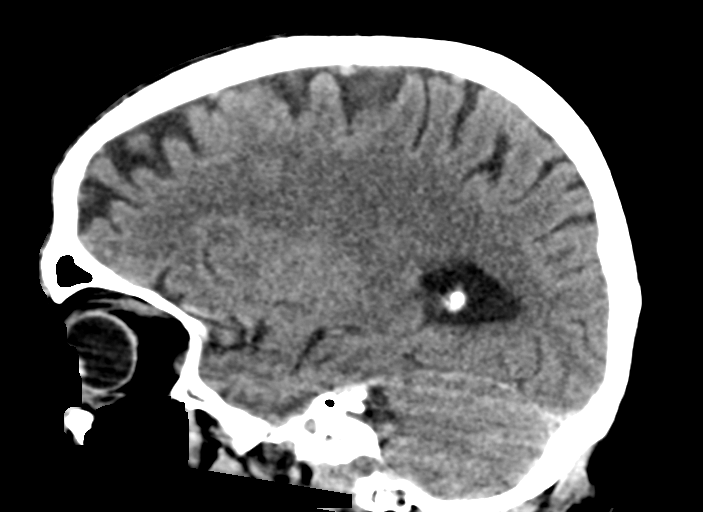

[15 of 47 positions shown; findings below may reference images not displayed]

FINDINGS: Mild generalized cerebral volume loss and chronic small-vessel white
matter ischemic changes again noted.

No acute intracranial abnormalities are identified, including mass
lesion or mass effect, hydrocephalus, extra-axial fluid collection,
midline shift, hemorrhage, or acute infarction.

The visualized bony calvarium is unremarkable.
IMPRESSION: No evidence of acute intracranial abnormality.

Mild atrophy and chronic small-vessel white matter ischemic changes.

## 2017-04-18 IMAGING — CR DG CHEST 2V
2 series · 2 of 2 positions shown · non-contrast
Comparison: 11/20/2015 and prior chest radiographs dating back to
12/14/2012

CLINICAL DATA: Acute chest pain for 2 hours.

EXAM:
CHEST  2 VIEW

[chest pa]
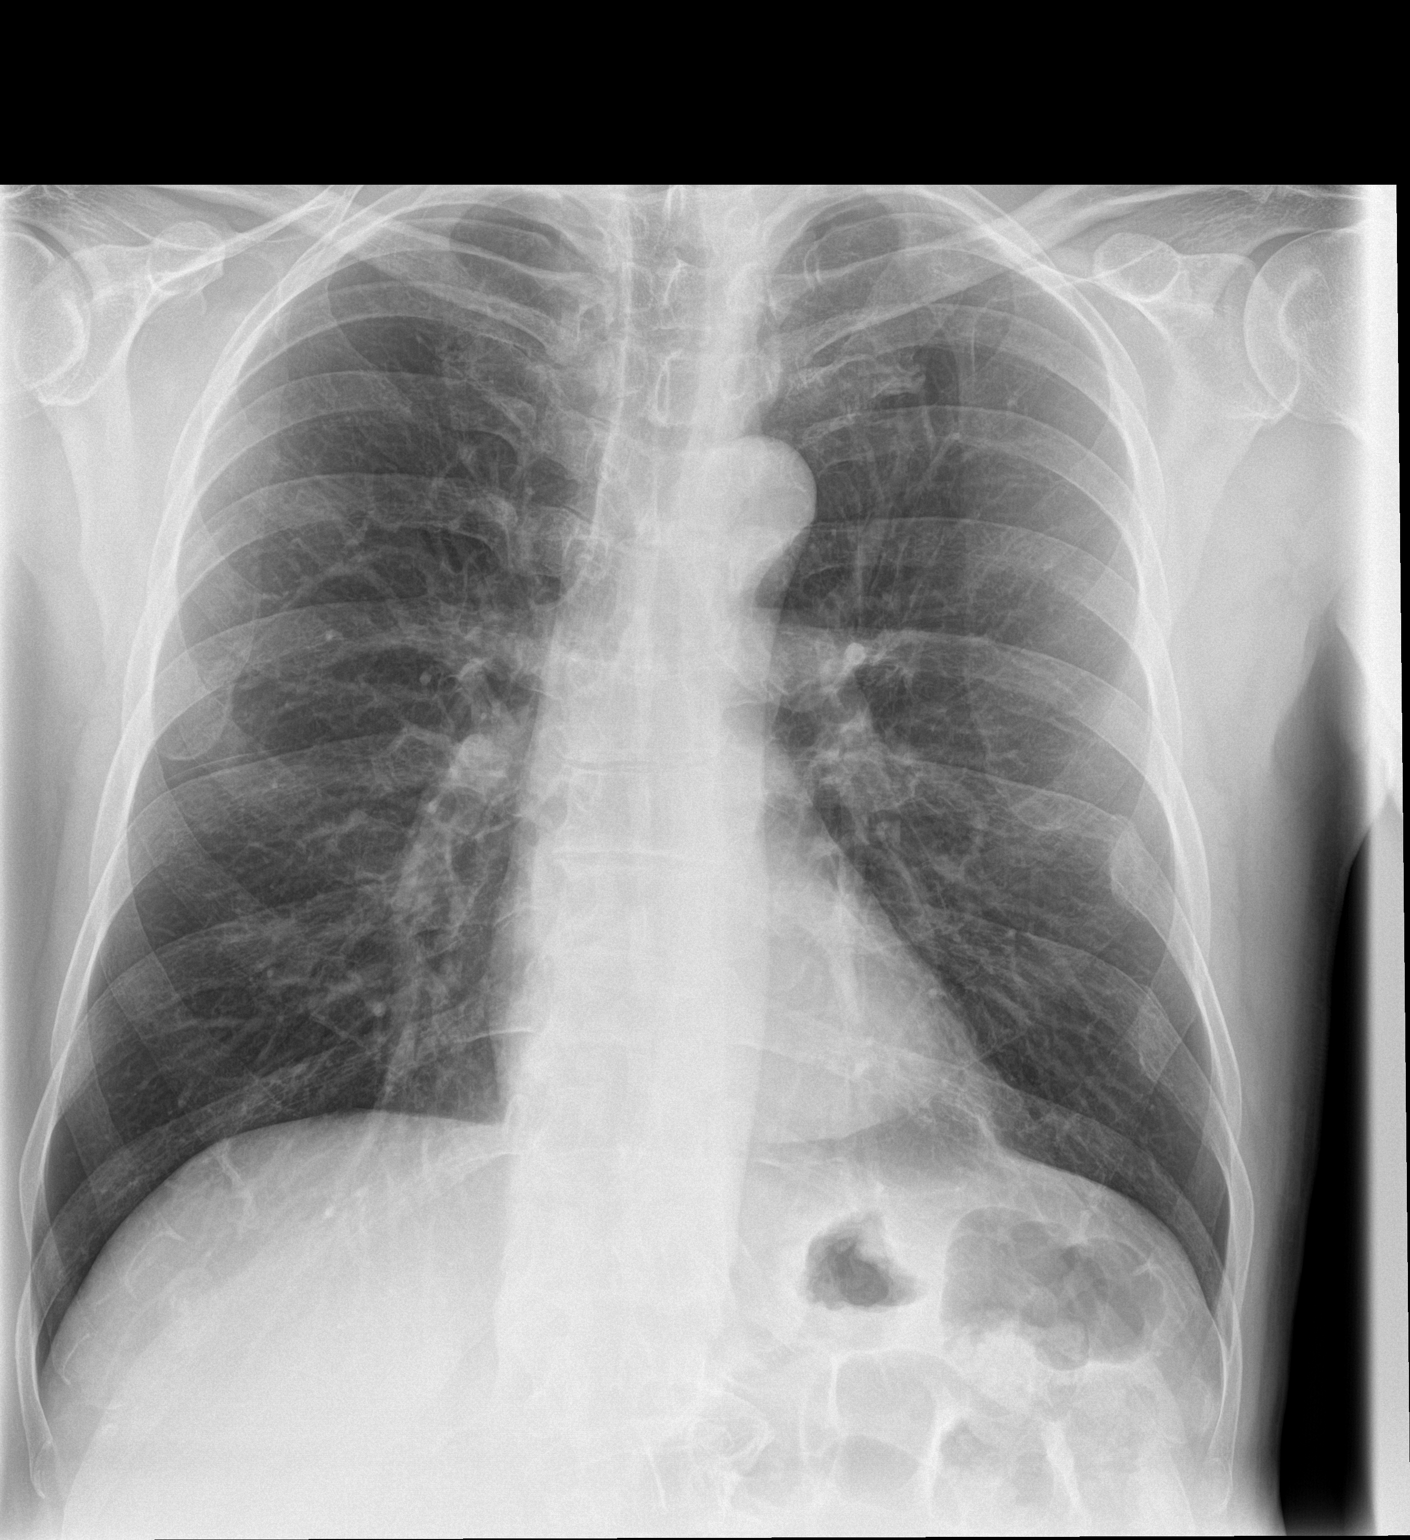

[chest lat]
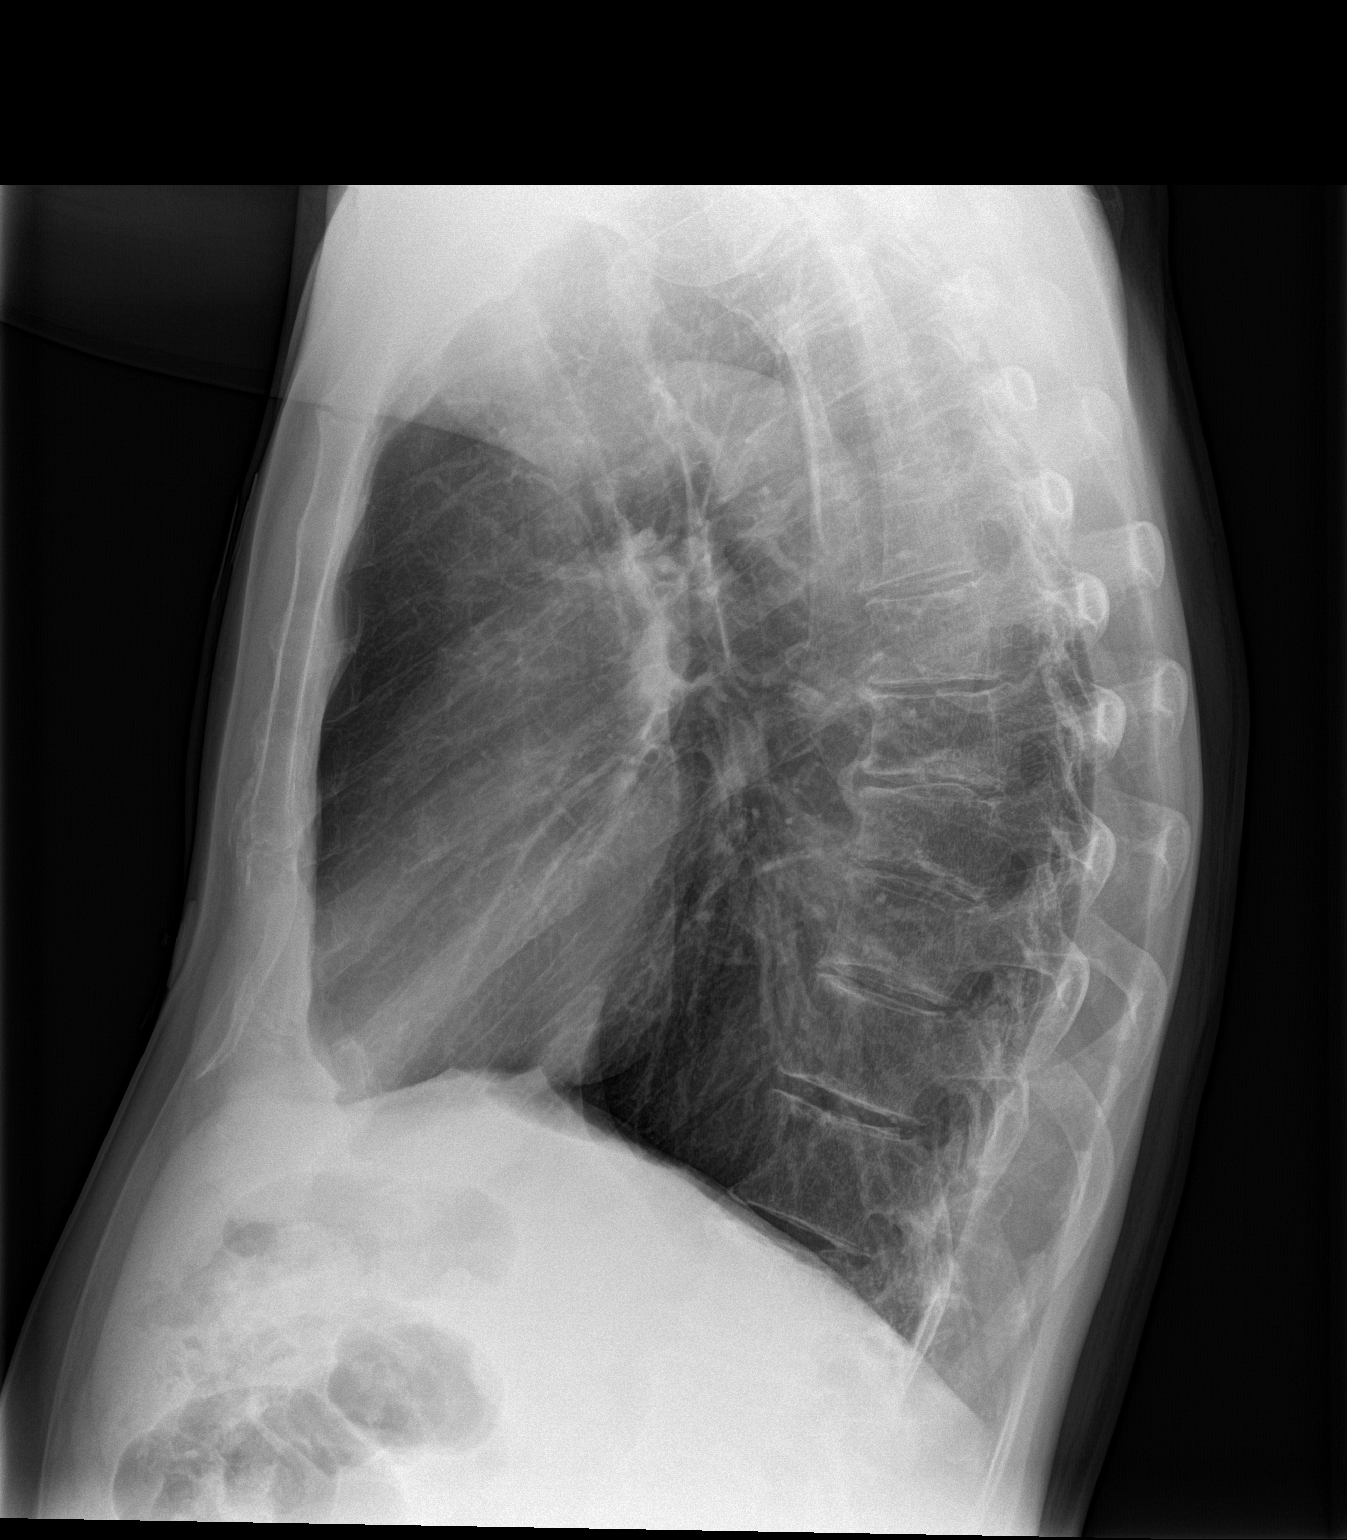

[2 of 2 positions shown; findings below may reference images not displayed]

FINDINGS: The cardiomediastinal silhouette is unremarkable.

There is no evidence of focal airspace disease, pulmonary edema,
suspicious pulmonary nodule/mass, pleural effusion, or pneumothorax.
No acute bony abnormalities are identified.

Remote left rib fractures again noted.
IMPRESSION: No active cardiopulmonary disease.

## 2017-05-13 ENCOUNTER — Encounter: Payer: Self-pay | Admitting: Emergency Medicine

## 2017-05-13 ENCOUNTER — Emergency Department: Payer: Self-pay

## 2017-05-13 ENCOUNTER — Emergency Department
Admission: EM | Admit: 2017-05-13 | Discharge: 2017-05-13 | Disposition: A | Discharge status: Home / Self Care | Payer: Self-pay | Attending: Emergency Medicine | Admitting: Emergency Medicine

## 2017-05-13 DIAGNOSIS — F1490 Cocaine use, unspecified, uncomplicated: Secondary | ICD-10-CM | POA: Insufficient documentation

## 2017-05-13 DIAGNOSIS — T148XXA Other injury of unspecified body region, initial encounter: Secondary | ICD-10-CM

## 2017-05-13 DIAGNOSIS — F17200 Nicotine dependence, unspecified, uncomplicated: Secondary | ICD-10-CM | POA: Insufficient documentation

## 2017-05-13 DIAGNOSIS — S0081XA Abrasion of other part of head, initial encounter: Secondary | ICD-10-CM | POA: Insufficient documentation

## 2017-05-13 DIAGNOSIS — Y929 Unspecified place or not applicable: Secondary | ICD-10-CM | POA: Insufficient documentation

## 2017-05-13 DIAGNOSIS — Y999 Unspecified external cause status: Secondary | ICD-10-CM | POA: Insufficient documentation

## 2017-05-13 DIAGNOSIS — I10 Essential (primary) hypertension: Secondary | ICD-10-CM | POA: Insufficient documentation

## 2017-05-13 DIAGNOSIS — Y939 Activity, unspecified: Secondary | ICD-10-CM | POA: Insufficient documentation

## 2017-05-13 DIAGNOSIS — F10920 Alcohol use, unspecified with intoxication, uncomplicated: Secondary | ICD-10-CM | POA: Insufficient documentation

## 2017-05-13 DIAGNOSIS — S0990XA Unspecified injury of head, initial encounter: Secondary | ICD-10-CM

## 2017-05-13 LAB — COMPREHENSIVE METABOLIC PANEL WITH GFR
ALT: 51 U/L (ref 17–63)
AST: 73 U/L — ABNORMAL HIGH (ref 15–41)
Albumin: 4.4 g/dL (ref 3.5–5.0)
Alkaline Phosphatase: 80 U/L (ref 38–126)
Anion gap: 13 (ref 5–15)
BUN: 9 mg/dL (ref 6–20)
CO2: 24 mmol/L (ref 22–32)
Calcium: 9.3 mg/dL (ref 8.9–10.3)
Chloride: 104 mmol/L (ref 101–111)
Creatinine, Ser: 0.79 mg/dL (ref 0.61–1.24)
GFR calc Af Amer: >60 mL/min
GFR calc non Af Amer: >60 mL/min
Glucose, Bld: 95 mg/dL (ref 65–99)
Potassium: 3.7 mmol/L (ref 3.5–5.1)
Sodium: 141 mmol/L (ref 135–145)
Total Bilirubin: 0.6 mg/dL (ref 0.3–1.2)
Total Protein: 8.4 g/dL — ABNORMAL HIGH (ref 6.5–8.1)

## 2017-05-13 LAB — URINE DRUG SCREEN, QUALITATIVE (ARMC ONLY)
Amphetamines, Ur Screen: NOT DETECTED
BARBITURATES, UR SCREEN: NOT DETECTED
Benzodiazepine, Ur Scrn: NOT DETECTED
CANNABINOID 50 NG, UR ~~LOC~~: NOT DETECTED
Cocaine Metabolite,Ur ~~LOC~~: POSITIVE — AB
MDMA (Ecstasy)Ur Screen: NOT DETECTED
Methadone Scn, Ur: NOT DETECTED
Opiate, Ur Screen: NOT DETECTED
Phencyclidine (PCP) Ur S: NOT DETECTED
TRICYCLIC, UR SCREEN: NOT DETECTED

## 2017-05-13 LAB — CBC
HCT: 44.3 % (ref 40.0–52.0)
Hemoglobin: 15.2 g/dL (ref 13.0–18.0)
MCH: 32.2 pg (ref 26.0–34.0)
MCHC: 34.2 g/dL (ref 32.0–36.0)
MCV: 94 fL (ref 80.0–100.0)
Platelets: 124 K/uL — ABNORMAL LOW (ref 150–440)
RBC: 4.71 MIL/uL (ref 4.40–5.90)
RDW: 13.1 % (ref 11.5–14.5)
WBC: 6 K/uL (ref 3.8–10.6)

## 2017-05-13 LAB — ETHANOL: Alcohol, Ethyl (B): 410 mg/dL

## 2017-05-13 MED ORDER — KETOROLAC TROMETHAMINE 30 MG/ML IJ SOLN
30.0000 mg | Freq: Once | INTRAMUSCULAR | Status: AC
  Administered 2017-05-13: 30 mg via INTRAVENOUS
  Filled 2017-05-13: qty 1

## 2017-05-13 MED ORDER — SODIUM CHLORIDE 0.9 % IV BOLUS (SEPSIS)
1000.0000 mL | Freq: Once | INTRAVENOUS | Status: AC
Start: 2017-05-13 — End: 2017-05-13
  Administered 2017-05-13: 1000 mL via INTRAVENOUS
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
## 2017-05-13 NOTE — ED Notes (Signed)
Pt declined wheelchair, ambulatory without difficulty
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
## 2017-06-07 ENCOUNTER — Emergency Department: Payer: Self-pay

## 2017-06-07 ENCOUNTER — Encounter: Payer: Self-pay | Admitting: Emergency Medicine

## 2017-06-07 ENCOUNTER — Emergency Department
Admission: EM | Admit: 2017-06-07 | Discharge: 2017-06-07 | Disposition: A | Discharge status: Home / Self Care | Payer: Self-pay | Attending: Emergency Medicine | Admitting: Emergency Medicine

## 2017-06-07 DIAGNOSIS — F17210 Nicotine dependence, cigarettes, uncomplicated: Secondary | ICD-10-CM | POA: Insufficient documentation

## 2017-06-07 DIAGNOSIS — F1320 Sedative, hypnotic or anxiolytic dependence, uncomplicated: Secondary | ICD-10-CM | POA: Insufficient documentation

## 2017-06-07 DIAGNOSIS — F1410 Cocaine abuse, uncomplicated: Secondary | ICD-10-CM | POA: Insufficient documentation

## 2017-06-07 DIAGNOSIS — I10 Essential (primary) hypertension: Secondary | ICD-10-CM | POA: Insufficient documentation

## 2017-06-07 DIAGNOSIS — F10920 Alcohol use, unspecified with intoxication, uncomplicated: Secondary | ICD-10-CM | POA: Insufficient documentation

## 2017-06-07 DIAGNOSIS — B182 Chronic viral hepatitis C: Secondary | ICD-10-CM | POA: Insufficient documentation

## 2017-06-07 LAB — COMPREHENSIVE METABOLIC PANEL
ALT: 48 U/L (ref 17–63)
ANION GAP: 11 (ref 5–15)
AST: 82 U/L — ABNORMAL HIGH (ref 15–41)
Albumin: 4.2 g/dL (ref 3.5–5.0)
Alkaline Phosphatase: 92 U/L (ref 38–126)
BUN: 8 mg/dL (ref 6–20)
CALCIUM: 9.1 mg/dL (ref 8.9–10.3)
CHLORIDE: 105 mmol/L (ref 101–111)
CO2: 26 mmol/L (ref 22–32)
CREATININE: 0.68 mg/dL (ref 0.61–1.24)
Glucose, Bld: 93 mg/dL (ref 65–99)
Potassium: 3.6 mmol/L (ref 3.5–5.1)
SODIUM: 142 mmol/L (ref 135–145)
Total Bilirubin: 0.5 mg/dL (ref 0.3–1.2)
Total Protein: 7.7 g/dL (ref 6.5–8.1)

## 2017-06-07 LAB — CBC
HCT: 45.2 % (ref 40.0–52.0)
HEMOGLOBIN: 15.7 g/dL (ref 13.0–18.0)
MCH: 32.5 pg (ref 26.0–34.0)
MCHC: 34.7 g/dL (ref 32.0–36.0)
MCV: 93.5 fL (ref 80.0–100.0)
Platelets: 112 10*3/uL — ABNORMAL LOW (ref 150–440)
RBC: 4.83 MIL/uL (ref 4.40–5.90)
RDW: 13.1 % (ref 11.5–14.5)
WBC: 6.4 10*3/uL (ref 3.8–10.6)

## 2017-06-07 LAB — URINE DRUG SCREEN, QUALITATIVE (ARMC ONLY)
AMPHETAMINES, UR SCREEN: NOT DETECTED
Barbiturates, Ur Screen: NOT DETECTED
Benzodiazepine, Ur Scrn: NOT DETECTED
COCAINE METABOLITE, UR ~~LOC~~: POSITIVE — AB
Cannabinoid 50 Ng, Ur ~~LOC~~: NOT DETECTED
MDMA (ECSTASY) UR SCREEN: NOT DETECTED
Methadone Scn, Ur: NOT DETECTED
OPIATE, UR SCREEN: NOT DETECTED
PHENCYCLIDINE (PCP) UR S: NOT DETECTED
Tricyclic, Ur Screen: NOT DETECTED

## 2017-06-07 LAB — ETHANOL: ALCOHOL ETHYL (B): 348 mg/dL — AB (ref <5)

## 2017-06-07 MED ORDER — ACETAMINOPHEN 500 MG PO TABS
1000.0000 mg | ORAL_TABLET | Freq: Once | ORAL | Status: AC
  Administered 2017-06-07: 1000 mg via ORAL
  Filled 2017-06-07: qty 2

## 2017-06-07 MED ORDER — KETOROLAC TROMETHAMINE 30 MG/ML IJ SOLN
30.0000 mg | Freq: Once | INTRAMUSCULAR | Status: AC
  Administered 2017-06-07: 30 mg via INTRAVENOUS
  Filled 2017-06-07: qty 1

## 2017-06-07 NOTE — ED Notes (Signed)
Pt intoxicated. Unsafe to d/c to lobby.

## 2017-06-07 NOTE — ED Notes (Signed)
Pt to edge of bed to use urinal. No complications.

## 2017-06-07 NOTE — ED Notes (Signed)
Instructed pt to call ride per MD Manson Passey. Says no ride. Consulting charge RN about what method transportation to use.

## 2017-06-07 NOTE — ED Notes (Signed)
Patient is resting comfortably at this time with no signs of distress present. VS stable. Will continue to monitor.   
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
## 2017-06-07 NOTE — ED Notes (Signed)
Pt states "it's been a bad night because my cirrhosis is acting up". Pt states he remains a daily beer drinker.

## 2017-06-07 NOTE — ED Provider Notes (Signed)
[facility] Emergency Department Provider Note ______   First MD Initiated Contact with Patient 06/07/17 [phone]     (approximate)  I have reviewed the triage vital signs and the nursing notes.   HISTORY  Chief Complaint No chief complaint on file.    HPI Jose [name] is a 56 y.o. male with loss of chronic medical conditions presents to the emergency department with "I think I'm having a stroke". Patient states that he drank alcohol use cocaine tonight and then subsequent started feeling weak all over but predominantly on his left side. Patient denies any trauma   Past Medical History:  Diagnosis Date  . Hep C w/o coma, chronic (HCC)   . Hypertension   . Polysubstance abuse     Patient Active Problem List   Diagnosis Date Noted  . 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 Substance induced mood disorder (HCC) 11/03/2015  . Alcohol use disorder, severe, dependence (HCC) 10/24/2015  . Alcohol withdrawal (HCC) 10/24/2015  . Stimulant use disorder (HCC) (cocaine) 10/24/2015  . Tobacco use disorder 10/24/2015  . Hepatitis C 09/06/2015    Past Surgical History none  Prior to Admission medications   Not on File    Allergies No History reviewed. No pertinent family history.  Social History Social History  Substance Use Topics  . Smoking status: Current Every Day Smoker    Packs/day: 0.50    Years: 45.00  . Smokeless tobacco: Never Used  . Alcohol use 36.0 oz/week    60 Cans of beer per week     Comment: 12 pack per day    Review of Systems Constitutional: No fever/chills Eyes: No visual changes. ENT: No sore throat. Cardiovascular: Denies chest pain. Respiratory: Denies shortness of breath. Gastrointestinal: No abdominal pain.  No nausea, no vomiting.  No diarrhea.  No constipation. Genitourinary:  Negative for dysuria. Musculoskeletal: Negative for neck pain.  Negative for back pain. Integumentary: Negative for rash. Neurological: Negative for headaches, focal weakness or numbness.   ____________________________________________   PHYSICAL EXAM:  VITAL SIGNS: ED Triage Vitals  Enc Vitals Group     BP 06/07/17 0328 (!) 156/105     Pulse Rate 06/07/17 0328 80     Resp 06/07/17 0328 17     Temp 06/07/17 0328 98 F (36.7 C)     Temp Source 06/07/17 0328 Oral     SpO2 06/07/17 0328 97 %     Weight 06/07/17 0329 72.1 kg (159 lb)     Height --      Head Circumference --      Peak Flow --      Pain Score --      Pain Loc --      Pain Edu? --      Excl. in GC? --     Constitutional: Alert and oriented. appears intoxicated Eyes: Conjunctivae are normal.  Head: Atraumatic.Marland Kitchen Mouth/Throat: Mucous membranes are moist.  Oropharynx non-erythematous. Neck: No stridor.   Cardiovascular: Normal rate, regular rhythm. Good peripheral circulation. Grossly normal heart sounds. Respiratory: Normal respiratory effort.  No retractions. Lungs CTAB. Gastrointestinal: Soft and nontender. No distention.  Musculoskeletal: No lower extremity tenderness nor edema. No gross deformities of extremities. Neurologic:  Normal speech and language. No gross focal neurologic deficits are appreciated.  Skin:  Skin is warm, dry and intact. No rash noted. Psychiatric: Appears intoxicated. Speech  and behavior are normal.  ____________________________________________   LABS (all labs ordered are listed, but only abnormal results are displayed)  Labs Reviewed  URINE DRUG SCREEN, QUALITATIVE ([facility] ONLY) - Abnormal; Notable for the following:       Result Value   Cocaine Metabolite,Ur [place] POSITIVE (*)    All other components within normal limits  ETHANOL - Abnormal; Notable for the following:    Alcohol, Ethyl (B) 348 (*)    All other components within normal limits  COMPREHENSIVE METABOLIC PANEL -  Abnormal; Notable for the following:    AST 82 (*)    All other components within normal limits  CBC - Abnormal; Notable for the following:    Platelets 112 (*)    All other components within normal limits   ____________________________________________  EKG  ED ECG REPORT I, [place] N Maliyah Willets, the attending physician, personally viewed and interpreted this ECG.   Date: 06/07/2017  EKG Time: 3:30 a.m.  Rate: 80  Rhythm: normal sinus rhythm with right bundle-branch block  Axis: normal  Intervals:normal  ST&T Change: none  ____________________________________________  RADIOLOGY I, [place] N Teran Knittle, personally viewed and evaluated these images (plain radiographs) as part of my medical decision making, as well as reviewing the written report by the radiologist.  Ct Head Wo Contrast  Result Date: 06/07/2017 CLINICAL DATA:  Altered level of consciousness. EXAM: CT HEAD WITHOUT CONTRAST TECHNIQUE: Contiguous axial images were obtained from the base of the skull through the vertex without intravenous contrast. COMPARISON:  Head CT 05/13/2017 FINDINGS: Brain: No intracranial hemorrhage, mass effect, or midline shift. No hydrocephalus. The basilar cisterns are patent. No evidence of territorial infarct or acute ischemia. No extra-axial or intracranial fluid collection. Vascular: Atherosclerosis of skullbase vasculature without hyperdense vessel or abnormal calcification. Skull: No skull fracture.  No focal lesion. Sinuses/Orbits: Mild mucosal thickening of right frontal sinus, chronic. No acute finding. Other: None. IMPRESSION: No acute intracranial abnormality. Electronically Signed   By: Rubye Oaks M.D.   On: 06/07/2017 04:57      Procedures   ____________________________________________   INITIAL IMPRESSION / ASSESSMENT AND PLAN / ED COURSE  Pertinent labs & imaging results that were available during my care of the patient were reviewed by me and considered in my medical  decision making (see chart for details).  56 year old male presenting with above stated history of physical exam. She appeared intoxicated urine evaluationconfirm an alcohol level of348 and positive cocaine in the urine drug screen. Patient's CT scan of the head revealed no evidence of hemorrhage or infarction      ____________________________________________  FINAL CLINICAL IMPRESSION(S) / ED DIAGNOSES  Final diagnoses:  Cocaine abuse  Alcoholic intoxication without complication (HCC)     MEDICATIONS GIVEN DURING THIS VISIT:  Medications  ketorolac (TORADOL) 30 MG/ML injection 30 mg (30 mg Intravenous Given 06/07/17 0456)     NEW OUTPATIENT MEDICATIONS STARTED DURING THIS VISIT:  New Prescriptions   No medications on file    Modified Medications   No medications on file    Discontinued Medications   No medications on file     Note:  This document was prepared using Dragon voice recognition software and may include unintentional dictation errors.    Darci Current, MD 06/07/17 [phone]

## 2017-09-29 ENCOUNTER — Emergency Department: Payer: Self-pay

## 2017-09-29 ENCOUNTER — Emergency Department
Admission: EM | Admit: 2017-09-29 | Discharge: 2017-09-29 | Discharge status: Against Medical Advice | Payer: Self-pay | Attending: Emergency Medicine | Admitting: Emergency Medicine

## 2017-09-29 ENCOUNTER — Encounter: Payer: Self-pay | Admitting: Emergency Medicine

## 2017-09-29 ENCOUNTER — Other Ambulatory Visit: Payer: Self-pay

## 2017-09-29 DIAGNOSIS — F1010 Alcohol abuse, uncomplicated: Secondary | ICD-10-CM

## 2017-09-29 DIAGNOSIS — I10 Essential (primary) hypertension: Secondary | ICD-10-CM | POA: Insufficient documentation

## 2017-09-29 DIAGNOSIS — R0789 Other chest pain: Secondary | ICD-10-CM | POA: Insufficient documentation

## 2017-09-29 DIAGNOSIS — F10120 Alcohol abuse with intoxication, uncomplicated: Secondary | ICD-10-CM | POA: Insufficient documentation

## 2017-09-29 DIAGNOSIS — F1910 Other psychoactive substance abuse, uncomplicated: Secondary | ICD-10-CM | POA: Insufficient documentation

## 2017-09-29 DIAGNOSIS — F17210 Nicotine dependence, cigarettes, uncomplicated: Secondary | ICD-10-CM | POA: Insufficient documentation

## 2017-09-29 LAB — BASIC METABOLIC PANEL
Anion gap: 11 (ref 5–15)
BUN: 9 mg/dL (ref 6–20)
CO2: 24 mmol/L (ref 22–32)
Calcium: 9.5 mg/dL (ref 8.9–10.3)
Chloride: 103 mmol/L (ref 101–111)
Creatinine, Ser: 0.74 mg/dL (ref 0.61–1.24)
GFR calc Af Amer: >60 mL/min (ref >60)
Glucose, Bld: 97 mg/dL (ref 65–99)
POTASSIUM: 3.7 mmol/L (ref 3.5–5.1)
Sodium: 138 mmol/L (ref 135–145)

## 2017-09-29 LAB — CBC
HEMATOCRIT: 45.6 % (ref 40.0–52.0)
Hemoglobin: 15.5 g/dL (ref 13.0–18.0)
MCH: 32.6 pg (ref 26.0–34.0)
MCHC: 34.1 g/dL (ref 32.0–36.0)
MCV: 95.8 fL (ref 80.0–100.0)
Platelets: 120 10*3/uL — ABNORMAL LOW (ref 150–440)
RBC: 4.76 MIL/uL (ref 4.40–5.90)
RDW: 13.1 % (ref 11.5–14.5)
WBC: 7.8 10*3/uL (ref 3.8–10.6)

## 2017-09-29 LAB — TROPONIN I: Troponin I: <0.03 ng/mL (ref <0.03)

## 2017-09-29 LAB — ETHANOL: Alcohol, Ethyl (B): 366 mg/dL (ref <10)

## 2017-09-29 MED ORDER — KETOROLAC TROMETHAMINE 30 MG/ML IJ SOLN
30.0000 mg | Freq: Once | INTRAMUSCULAR | Status: AC
  Administered 2017-09-29: 30 mg via INTRAVENOUS
  Filled 2017-09-29: qty 1

## 2017-09-29 NOTE — ED Notes (Addendum)
At 2000, pt upset in hall about lack of pain medication and having to be in a hallway bed. MD Siadecki at bedside and pt decided to sign out AMA. This RN present at the time and MD explained to pt about what could happen before he is medically cleared including possible heart attack. Pt states that he is not concerned about it and he wants to leave. Pt signed out AMA by this RN.

## 2017-09-29 NOTE — ED Notes (Signed)
Pt brought in via ems   Iv in place.  Pt has instemittent chest pain.  Pt admits to cocaine use, etoh, and smoking every day.  No sob. No n/v/d.  Pt alert.  Pt in hallway bed.  siderails up x 2.

## 2017-09-29 NOTE — ED Triage Notes (Signed)
Pt to ED via [facility]. Per EMS report, they were called out for chest pain. Pt states that he was walking down the road and started experiencing left sided chest pain that radiates into the left arm. Pt admits to smoking crack today around 2 pm and drinking "a lot" of alcohol. Pt slurring words in triage. Pt in NAD at this time.

## 2017-09-29 NOTE — Discharge Instructions (Signed)
Return to the emergency department immediately for new or worsening chest pain, difficulty breathing, other pain, or any other new or worsening symptoms that concern you.  You may return at any time if you change your mind and wish to resume the workup that we started in the Er.

## 2017-09-29 NOTE — ED Provider Notes (Signed)
[facility] Emergency Department Provider Note ____________________________________________   First MD Initiated Contact with Patient 09/29/17 1912     (approximate)  I have reviewed the triage vital signs and the nursing notes.   HISTORY  Chief Complaint Chest Pain; Alcohol Intoxication; and Addiction Problem  History of present illness is limited due to intoxication.  HPI Jose [name] is a 57 y.o. male with past medical history as noted below who presents with chest pain.  He reports the pain is sharp and mainly in the substernal left chest area.  He is not sure of the onset of the pain.  He also reports chronic left arm numbness which is been going on for approximately 1 year, with no new symptoms or changes.  The patient admits to smoking crack this afternoon and drink alcohol.   Past Medical History:  Diagnosis Date  . Hep C w/o coma, chronic (HCC)   . Hypertension   . Polysubstance abuse [facility](HCC)     Patient Active Problem List   Diagnosis Date Noted  . 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 Substance induced mood disorder (HCC) 11/03/2015  . Alcohol use disorder, severe, dependence (HCC) 10/24/2015  . Alcohol withdrawal (HCC) 10/24/2015  . Stimulant use disorder (HCC) (cocaine) 10/24/2015  . Tobacco use disorder 10/24/2015  . Hepatitis C 09/06/2015    History reviewed. No pertinent surgical history.  Prior to Admission medications   Not on File    Allergies Patient has no known allergies.  No family history on file.  Social History Social History   Tobacco Use  . Smoking status: Current Every Day Smoker    Packs/day: 0.50    Years: 45.00    Pack years: 22.50  . Smokeless tobacco: Never Used  Substance Use Topics  . Alcohol use: Yes    Alcohol/week: 36.0 oz    Types: 60 Cans of beer  per week    Comment: 12 pack per day  . Drug use: Yes    Types: Cocaine    Comment: last use cocaine 10/31/15    Review of Systems Level V caveat: Review of systems limited due to intoxication Constitutional: No fever. Cardiovascular: Positive for chest pain. Respiratory: Positive for shortness of breath. Gastrointestinal: No no vomiting.   Musculoskeletal: Negative for back pain. Skin: Negative for rash. Neurological: Negative for headache.   ____________________________________________   PHYSICAL EXAM:  VITAL SIGNS: ED Triage Vitals  Enc Vitals Group     BP 09/29/17 1833 (!) 137/100     Pulse Rate 09/29/17 1833 95     Resp 09/29/17 1833 16     Temp 09/29/17 1833 97.8 F (36.6 C)     Temp Source 09/29/17 1833 Oral     SpO2 09/29/17 1833 94 %     Weight 09/29/17 1830 140 lb (63.5 kg)     Height 09/29/17 1830 5\' 8"  (1.727 m)     Head Circumference --      Peak Flow --      Pain Score 09/29/17 1829 8     Pain Loc --      Pain Edu? --      Excl. in GC? --     Constitutional: Alert and oriented x3.  Comfortable appearing. Eyes: Conjunctivae are normal.  EOMI.  PERRLA. Head: Atraumatic. Nose: No congestion/rhinnorhea. Mouth/Throat: Mucous membranes are slightly dry.   Neck:  Normal range of motion.  Cardiovascular: Normal rate, regular rhythm. Grossly normal heart sounds.  Good peripheral circulation.  Mild anterior chest wall tenderness. Respiratory: Normal respiratory effort.  No retractions. Lungs CTAB. Gastrointestinal: Soft and nontender. No distention.  Genitourinary: No CVA tenderness. Musculoskeletal: No lower extremity edema.  Extremities warm and well perfused.  Neurologic: Motor intact in all extremities.  Slurred speech consistent with alcohol intoxication. Skin:  Skin is warm and dry. No rash noted. Psychiatric: Patient is calm and cooperative.  ____________________________________________   LABS (all labs ordered are listed, but only abnormal results  are displayed)  Labs Reviewed  CBC - Abnormal; Notable for the following components:      Result Value   Platelets 120 (*)    All other components within normal limits  ETHANOL - Abnormal; Notable for the following components:   Alcohol, Ethyl (B) 366 (*)    All other components within normal limits  BASIC METABOLIC PANEL  TROPONIN I  TROPONIN I   ____________________________________________  EKG  ED ECG REPORT I, Dionne Bucy, the attending physician, personally viewed and interpreted this ECG.  Date: 09/29/2017 EKG Time: 1827 Rate: 92 Rhythm: normal sinus rhythm QRS Axis: normal Intervals: Right bundle branch block ST/T Wave abnormalities: normal Narrative Interpretation: no evidence of acute ischemia  ____________________________________________  RADIOLOGY  CXR: No focal infiltrate or other acute findings  ____________________________________________   PROCEDURES  Procedure(s) performed: No    Critical Care performed: No ____________________________________________   INITIAL IMPRESSION / ASSESSMENT AND PLAN / ED COURSE  Pertinent labs & imaging results that were available during my care of the patient were reviewed by me and considered in my medical decision making (see chart for details).  57 year old male with past medical history as noted above including history of hepatitis C and alcohol abuse presents with atypical chest pain of unclear onset and duration.  With this pain is in the context of active alcohol and crack cocaine use.  On exam, the vital signs are normal except for slight hypertension, the patient is relatively comfortable appearing, and the remainder the exam is unremarkable except for slurred speech consistent with alcohol intoxication.  Past medical records reviewed in Epic; the patient has had multiple prior ED visits for intoxication as well as medical symptoms including feeling like he was having a stroke and a visit for chest  pain after crack and alcohol abuse in April 2018.  Troponins negative x2 on that visit.  Presentation most consistent with muscular skeletal or other benign cause, and possible cocaine related.  Low suspicion for ischemia given the normal EKG.  Plan for troponins x2 and likely discharge home if negative.  ----------------------------------------- 8:23 PM on 09/29/2017 -----------------------------------------  Initial troponin and other labs are unremarkable.  Chest x-ray also with no acute findings.  The patient got up from the stretcher and stated that he was leaving.  I had a discussion with the patient, and the patient stated that he was unhappy because he was in a hallway stretcher.  He stated that he would walk out of the ER and "have a stroke, and then it would be on y'all."  I asked the patient if he was having any symptoms that he thought could be related to a stroke and he said no.  At this time the patient is a and O x4, and walking with a steady gait.  Despite the elevated alcohol level on the initial blood draw, at this time he is clinically sober and not an  acute danger to self or others.  I explained to the patient that without doing a repeat blood test in several hours, I could not completely rule out heart attack which could cause permanent heart damage, permanent disability, or death.  The patient was able to paraphrase this back to me, and demonstrated understanding.  The patient demonstrates adequate decision-making capacity at this time to decline further care and leave the emergency department.  He signed the [facility] paperwork.  I gave return precautions and reminded the patient that he could return anytime if he changed his mind.  The patient expressed understanding.  IV removed by RN.  ____________________________________________   FINAL CLINICAL IMPRESSION(S) / ED DIAGNOSES  Final diagnoses:  Atypical chest pain  Alcohol abuse  Substance abuse (HCC)      NEW MEDICATIONS  STARTED DURING THIS VISIT:  This SmartLink is deprecated. Use AVSMEDLIST instead to display the medication list for a patient.   Note:  This document was prepared using Dragon voice recognition software and may include unintentional dictation errors.    Dionne Bucy, MD 09/29/17 2026

## 2017-12-29 IMAGING — DX DG CHEST 1V PORT
1 series · 1 of 1 positions shown · non-contrast
Comparison: 05/02/2016

CLINICAL DATA: Chest pain for 1 month

EXAM:
PORTABLE CHEST 1 VIEW

[chest ap]
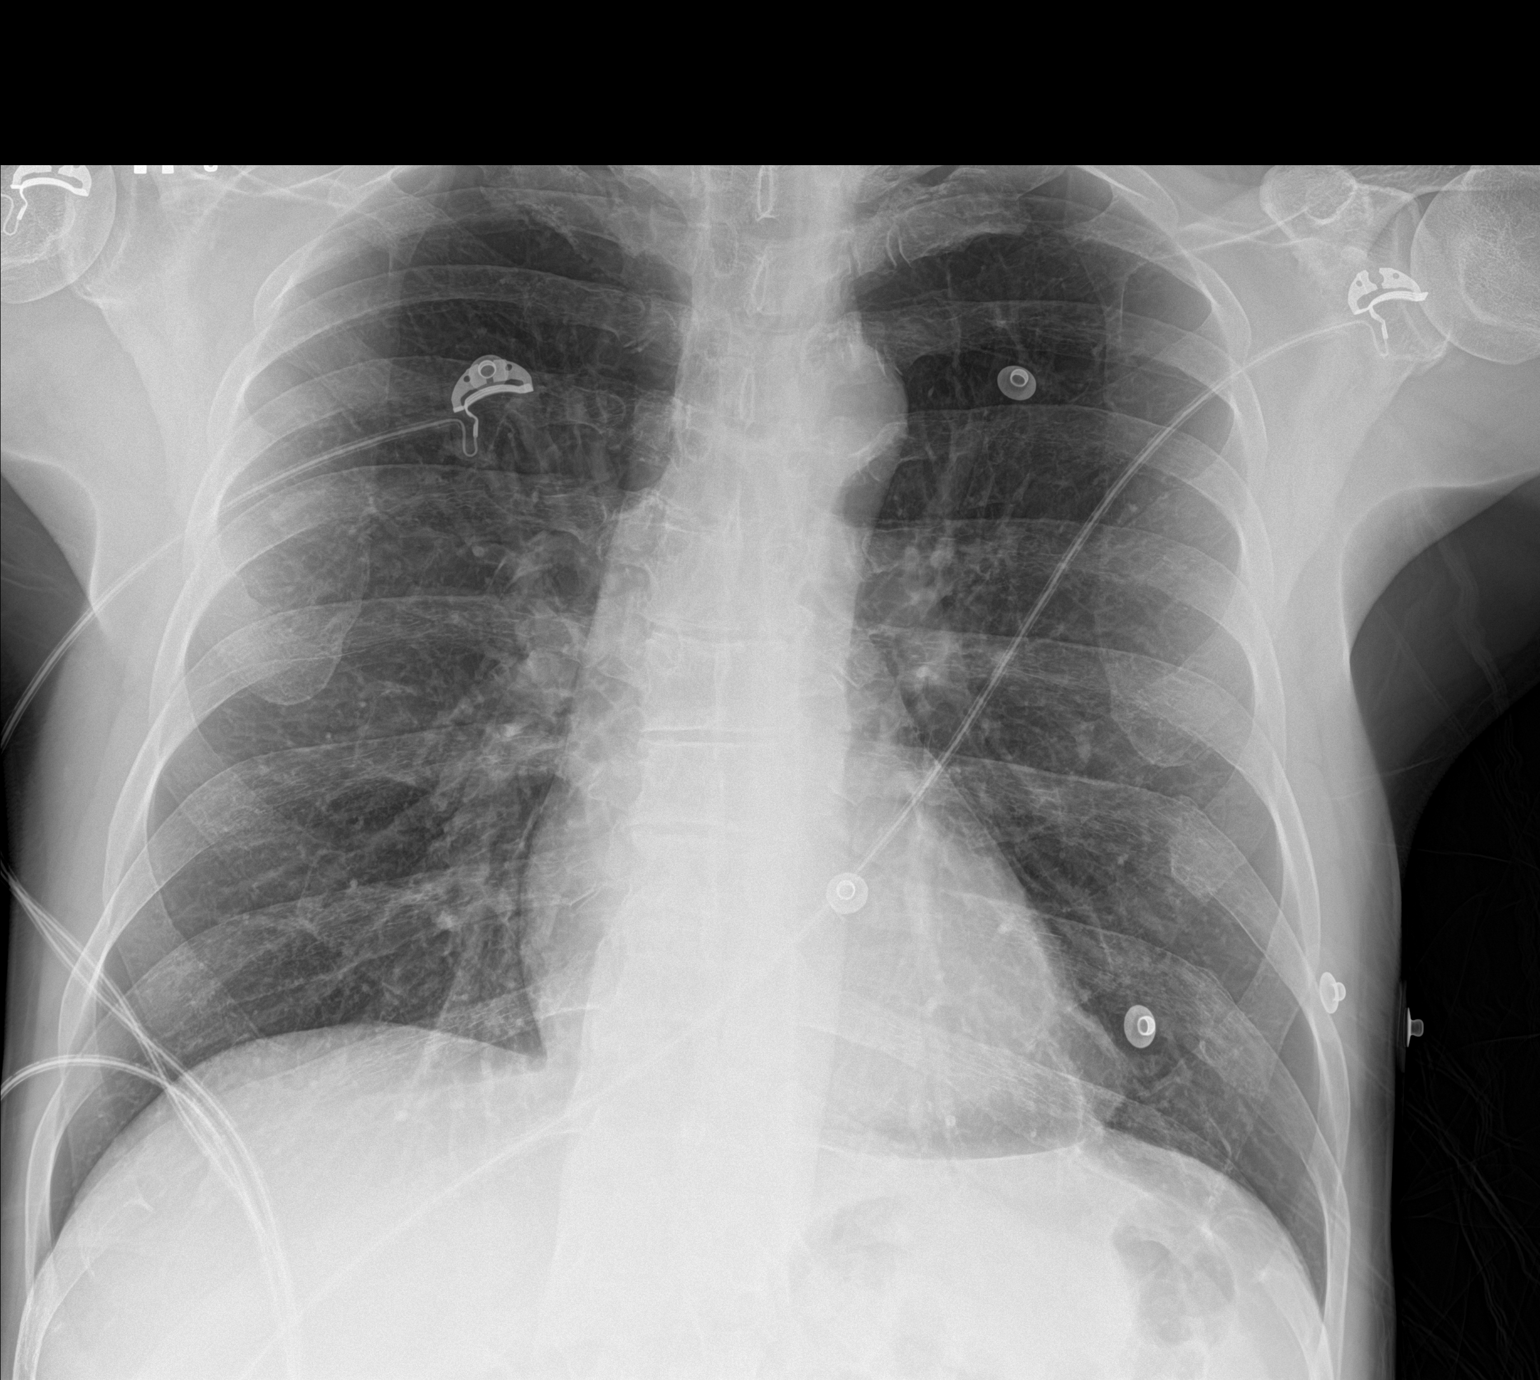

[1 of 1 positions shown; findings below may reference images not displayed]

FINDINGS: The lungs appear slightly hyperinflated. There is no acute
infiltrate or effusion. Cardiomediastinal silhouette within normal
limits with atherosclerosis. There is no pneumothorax.
IMPRESSION: No active disease.
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
## 2018-05-03 ENCOUNTER — Emergency Department: Payer: Self-pay

## 2018-05-03 ENCOUNTER — Encounter: Payer: Self-pay | Admitting: Emergency Medicine

## 2018-05-03 ENCOUNTER — Emergency Department
Admission: EM | Admit: 2018-05-03 | Discharge: 2018-05-04 | Disposition: A | Discharge status: Home / Self Care | Payer: Self-pay | Attending: Emergency Medicine | Admitting: Emergency Medicine

## 2018-05-03 ENCOUNTER — Other Ambulatory Visit: Payer: Self-pay

## 2018-05-03 DIAGNOSIS — R079 Chest pain, unspecified: Secondary | ICD-10-CM | POA: Insufficient documentation

## 2018-05-03 DIAGNOSIS — F17210 Nicotine dependence, cigarettes, uncomplicated: Secondary | ICD-10-CM | POA: Insufficient documentation

## 2018-05-03 DIAGNOSIS — F10920 Alcohol use, unspecified with intoxication, uncomplicated: Secondary | ICD-10-CM | POA: Insufficient documentation

## 2018-05-03 DIAGNOSIS — F1410 Cocaine abuse, uncomplicated: Secondary | ICD-10-CM | POA: Insufficient documentation

## 2018-05-03 DIAGNOSIS — I10 Essential (primary) hypertension: Secondary | ICD-10-CM | POA: Insufficient documentation

## 2018-05-03 LAB — CBC
HCT: 42.7 % (ref 40.0–52.0)
HEMOGLOBIN: 15.2 g/dL (ref 13.0–18.0)
MCH: 34 pg (ref 26.0–34.0)
MCHC: 35.6 g/dL (ref 32.0–36.0)
MCV: 95.5 fL (ref 80.0–100.0)
PLATELETS: 139 10*3/uL — AB (ref 150–440)
RBC: 4.48 MIL/uL (ref 4.40–5.90)
RDW: 13 % (ref 11.5–14.5)
WBC: 7.1 10*3/uL (ref 3.8–10.6)

## 2018-05-03 LAB — BASIC METABOLIC PANEL
ANION GAP: 12 (ref 5–15)
BUN: 8 mg/dL (ref 6–20)
CALCIUM: 9.5 mg/dL (ref 8.9–10.3)
CO2: 22 mmol/L (ref 22–32)
CREATININE: 0.78 mg/dL (ref 0.61–1.24)
Chloride: 101 mmol/L (ref 98–111)
GFR calc Af Amer: >60 mL/min (ref >60)
Glucose, Bld: 143 mg/dL — ABNORMAL HIGH (ref 70–99)
Potassium: 3.2 mmol/L — ABNORMAL LOW (ref 3.5–5.1)
Sodium: 135 mmol/L (ref 135–145)

## 2018-05-03 LAB — ETHANOL: Alcohol, Ethyl (B): 365 mg/dL (ref <10)

## 2018-05-03 LAB — TROPONIN I: Troponin I: <0.03 ng/mL (ref <0.03)

## 2018-05-03 MED ORDER — ZIPRASIDONE MESYLATE 20 MG IM SOLR
10.0000 mg | Freq: Once | INTRAMUSCULAR | Status: AC
  Administered 2018-05-04: 10 mg via INTRAMUSCULAR
  Filled 2018-05-03: qty 20

## 2018-05-03 MED ORDER — THIAMINE HCL 100 MG/ML IJ SOLN
Freq: Once | INTRAVENOUS | Status: AC
  Administered 2018-05-04: 02:00:00 via INTRAVENOUS
  Filled 2018-05-03: qty 1000

## 2018-05-03 MED ORDER — SODIUM CHLORIDE 0.9 % IV BOLUS
1000.0000 mL | Freq: Once | INTRAVENOUS | Status: AC
  Administered 2018-05-03: 1000 mL via INTRAVENOUS

## 2018-05-03 MED ORDER — LORAZEPAM 2 MG/ML IJ SOLN
1.0000 mg | Freq: Once | INTRAMUSCULAR | Status: AC
  Administered 2018-05-03: 1 mg via INTRAVENOUS
  Filled 2018-05-03: qty 1

## 2018-05-03 MED ORDER — THIAMINE HCL 100 MG/ML IJ SOLN
100.0000 mg | Freq: Every day | INTRAMUSCULAR | Status: DC
  Administered 2018-05-04: 100 mg via INTRAVENOUS
  Filled 2018-05-03: qty 2

## 2018-05-03 MED ORDER — LORAZEPAM 2 MG/ML IJ SOLN: 0.0000 mg | Freq: Four times a day (QID) | INTRAMUSCULAR | Status: DC

## 2018-05-03 NOTE — ED Provider Notes (Signed)
[facility] Emergency Department Provider Note   ____________________________________________   First MD Initiated Contact with Patient 05/03/18 2301     (approximate)  I have reviewed the triage vital signs and the nursing notes.   HISTORY  Chief Complaint Chest Pain  Level V caveat: History limited by intoxication  HPI Jose [name] is a 57 y.o. male brought to the ED via EMS with a chief complaint of chest pain.  Patient arrives heavily intoxicated, states he has been having chest pain for 20 years.  Endorses heavy alcohol use as well as cocaine use.  Rest of history is limited secondary to patient's intoxication.   Past Medical History:  Diagnosis Date  . Hep C w/o coma, chronic (HCC)   . Hypertension   . Polysubstance abuse [facility])     Patient Active Problem List   Diagnosis Date Noted  . 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 Substance induced mood disorder (HCC) 11/03/2015  . Alcohol use disorder, severe, dependence (HCC) 10/24/2015  . Alcohol withdrawal (HCC) 10/24/2015  . Stimulant use disorder (HCC) (cocaine) 10/24/2015  . Tobacco use disorder 10/24/2015  . Hepatitis C 09/06/2015    History reviewed. No pertinent surgical history.  Prior to Admission medications   Not on File    Allergies Patient has no known allergies.  History reviewed. No pertinent family history.  Social History Social History   Tobacco Use  . Smoking status: Current Every Day Smoker    Packs/day: 0.50    Years: 45.00    Pack years: 22.50  . Smokeless tobacco: Never Used  Substance Use Topics  . Alcohol use: Yes    Alcohol/week: 60.0 standard drinks    Types: 60 Cans of beer per week    Comment: 12 pack per day  . Drug use: Yes    Types: Cocaine    Comment: last use cocaine 10/31/15    Review of  Systems  Constitutional: No fever/chills Eyes: No visual changes. ENT: No sore throat. Cardiovascular: Positive for chest pain. Respiratory: Denies shortness of breath. Gastrointestinal: No abdominal pain.  No nausea, no vomiting.  No diarrhea.  No constipation. Genitourinary: Negative for dysuria. Musculoskeletal: Negative for back pain. Skin: Negative for rash. Neurological: Negative for headaches, focal weakness or numbness. Psychiatric:Positive for intoxication and belligerence.  ____________________________________________   PHYSICAL EXAM:  VITAL SIGNS: ED Triage Vitals  Enc Vitals Group     BP 05/03/18 2215 (!) 143/99     Pulse Rate 05/03/18 2212 (!) 108     Resp 05/03/18 2212 18     Temp 05/03/18 2215 98.1 F (36.7 C)     Temp Source 05/03/18 2212 Oral     SpO2 05/03/18 2215 96 %     Weight 05/03/18 2213 150 lb (68 kg)     Height 05/03/18 2213 5\' 8"  (1.727 m)     Head Circumference --      Peak Flow --      Pain Score 05/03/18 2212 8     Pain Loc --      Pain Edu? --      Excl. in GC? --     Constitutional: Alert and oriented.  Disheveled appearing and in no acute distress. Eyes: Conjunctivae are normal. PERRL. EOMI. Head: Atraumatic. Nose: No evidence of injury. Mouth/Throat: Mucous membranes are moist.  No dental malocclusion. Neck: No stridor.  No cervical spine tenderness to palpation. Cardiovascular: Normal rate, regular rhythm. Grossly normal heart sounds.  Good peripheral circulation. Respiratory: Normal respiratory effort.  No retractions. Lungs CTAB. Gastrointestinal: Soft and nontender to light or deep palpation.  Hepatomegaly.  No distention. No abdominal bruits. No CVA tenderness. Musculoskeletal: Abrasion to left wrist.  No lower extremity tenderness nor edema.  No joint effusions. Neurologic: Heavily intoxicated.  Slurred speech and language. No gross focal neurologic deficits are appreciated. MAEx4. Skin:  Skin is warm, dry and intact. No rash  noted. Psychiatric: Mood and affect are intoxicated and agitated. Speech and behavior are normal.  ____________________________________________   LABS (all labs ordered are listed, but only abnormal results are displayed)  Labs Reviewed  BASIC METABOLIC PANEL - Abnormal; Notable for the following components:      Result Value   Potassium 3.2 (*)    Glucose, Bld 143 (*)    All other components within normal limits  CBC - Abnormal; Notable for the following components:   Platelets 139 (*)    All other components within normal limits  ETHANOL - Abnormal; Notable for the following components:   Alcohol, Ethyl (B) 365 (*)    All other components within normal limits  TROPONIN I  TROPONIN I   ____________________________________________  EKG  ED ECG REPORT I, SUNG,JADE J, the attending physician, personally viewed and interpreted this ECG.   Date: 05/03/2018  EKG Time: 2212  Rate: 103  Rhythm: sinus tachycardia  Axis: Normal  Intervals:right bundle branch block  ST&T Change: Nonspecific  ____________________________________________  RADIOLOGY  ED MD interpretation: No acute cardiopulmonary process  Official radiology report(s): Dg Chest 2 View  Result Date: 05/03/2018 CLINICAL DATA:  Patient is intoxicated and began having chest pain yesterday. EXAM: CHEST - 2 VIEW COMPARISON:  None. FINDINGS: The heart size and mediastinal contours are within normal limits. The thoracic aorta is atherosclerotic and tortuous in appearance without aneurysm. Tiny granuloma or bone island projects over the anterior left second rib. Lungs are free of pulmonary consolidations. No CHF, effusion or pneumothorax. Remote left eighth and ninth healed rib fractures. No acute osseous abnormality. IMPRESSION: No active cardiopulmonary disease. Aortic atherosclerosis without aneurysm. Remote left-sided rib fractures. Electronically Signed   By: Tollie Ethavid  Kwon M.D.   On: 05/03/2018 22:32     ____________________________________________   PROCEDURES  Procedure(s) performed: None  Procedures  Critical Care performed: Yes, see critical care note(s)   CRITICAL CARE Performed by: Irean HongSUNG,JADE J   Total critical care time: 30 minutes  Critical care time was exclusive of separately billable procedures and treating other patients.  Critical care was necessary to treat or prevent imminent or life-threatening deterioration.  Critical care was time spent personally by me on the following activities: development of treatment plan with patient and/or surrogate as well as nursing, discussions with consultants, evaluation of patient's response to treatment, examination of patient, obtaining history from patient or surrogate, ordering and performing treatments and interventions, ordering and review of laboratory studies, ordering and review of radiographic studies, pulse oximetry and re-evaluation of patient's condition.  ____________________________________________   INITIAL IMPRESSION / ASSESSMENT AND PLAN / ED COURSE  As part of my medical decision making, I reviewed the following data within the electronic MEDICAL RECORD NUMBER Nursing notes reviewed and incorporated, Labs reviewed, EKG interpreted, Old chart reviewed, Radiograph reviewed and Notes from prior ED visits   57 year old intoxicated male using cocaine who presents with chest pain. Differential diagnosis includes, but is not limited to, ACS,  aortic dissection, pulmonary embolism, cardiac tamponade, pneumothorax, pneumonia, pericarditis, myocarditis, GI-related causes including esophagitis/gastritis, and musculoskeletal chest wall pain, substance induced chest pain, etc.     Clinical Course as of May 05 727  Sat May 03, 2018  2348 Patient is intoxicated, belligerent, now attempting to leave the premises.  I am forced to place patient under IVC for his safety as he is unable to be verbally redirected.   [JS]  2354  Banana bag and CIWA protocol ordered.  Will repeat troponin.  Will observe patient overnight in the ED until sober and ambulatory with steady gait.  At that time we will reassess and lift patient's IVC.   [JS]  Sun May 04, 2018  1610 Patient has been soundly asleep after receiving Geodon.  Awaiting results of timed troponin.   [JS]  0307 Repeat troponin remains unremarkable.  Patient sleeping soundly in no acute distress.  At this point will continue to monitor until patient is sober and ambulatory with steady gait.   [JS]  [id number] Patient sleeping.  IVC may be rescinded and patient may be discharged once patient is awake and ambulatory with steady gait. Care transferred to Dr. Darnelle Catalan pending disposition.   [JS]    Clinical Course User Index [JS] Irean Hong, MD     ____________________________________________   FINAL CLINICAL IMPRESSION(S) / ED DIAGNOSES  Final diagnoses:  Nonspecific chest pain  Alcoholic intoxication without complication (HCC)  Cocaine abuse [facility])     ED Discharge Orders    None       Note:  This document was prepared using Dragon voice recognition software and may include unintentional dictation errors.    Irean Hong, MD 05/04/18 [phone]

## 2018-05-03 NOTE — ED Notes (Signed)
Pt states "i'm not fucking movin' to the hallway, i'll fuckin' take it out, let me get the fuck out of here." pt is too intoxicated to stand safely.

## 2018-05-03 NOTE — ED Triage Notes (Signed)
Pt is intoxicated, states began to have chest pain yesterday. Pt states "I hurt in my liver, kidneys". Pt states fell tonight also. Pt with abrasion noted to right elbow and wrist.

## 2018-05-03 NOTE — ED Notes (Signed)
Pt states "get your fat ass moving, get me some medicine". Pt continues to swear at rn and remove medical monitoring equipment. Pt instructed multiple times to leave ekg monitoring in place.

## 2018-05-03 NOTE — ED Notes (Signed)
md notified of critical etoh 365. Pt moved to 19hallway due to intoxication and unwillingness to stay in bed for safety.

## 2018-05-03 NOTE — ED Notes (Signed)
Pt has rung call bell for 9th time in 36 minutes. Pt states "I don't mean to be an asshole, but need stuff, I need the doctor, I need food, I need to sleep."

## 2018-05-03 NOTE — ED Notes (Signed)
Pt has rung bell two additional times since last note. Multiple RNs in to speak with pt who continues to ask for multiple small requests.

## 2018-05-03 NOTE — ED Notes (Signed)
Pt ringing call bell approx every 3 minutes for small requests. Pt states "why haven't you given me food yet?" explanation of need to be evaluated by md provided again to pt. Pt continues to have difficulty understanding explanation.

## 2018-05-04 LAB — TROPONIN I

## 2018-05-04 NOTE — ED Notes (Signed)
Informed Dr. Darnelle CatalanMalinda pt is awake and walking with a steady gait and asking about being released.  Per Dr. Darnelle CatalanMalinda, pt is ok to [place].

## 2018-05-04 NOTE — Discharge Instructions (Addendum)
Drink alcohol only in moderation and stop using cocaine.  Return to the ER for worsening symptoms, persistent vomiting, difficulty breathing or other concerns.

## 2018-05-04 NOTE — ED Notes (Signed)
Pt resting quietly with eyes closed, respirations equal and unlabored. 

## 2018-05-04 NOTE — ED Notes (Signed)
Pt continues to sleep. Respirations unlabored.  

## 2018-05-04 NOTE — ED Provider Notes (Signed)
Patient is now pain-free he is awake alert walking normally not intoxicated any longer I will discharge him.   Arnaldo NatalMalinda, Trevell Pariseau F, MD 05/04/18 1027

## 2018-05-04 NOTE — ED Notes (Signed)
Pharmacy notified of need for banana bag ivf.

## 2018-05-04 NOTE — ED Notes (Addendum)
Pt awake and ambulating to bathroom with steady gait.  Pt asking about leaving. Informed him the doctor would have to release him first.  [place] home.  Pt states he does not have anyone to give him a ride.   Pt currently eating breakfast at this time and drinking sprite.

## 2018-05-04 NOTE — ED Notes (Signed)
Report to ashley, rn

## 2018-05-04 NOTE — ED Notes (Addendum)
Pt denies any SI/HI, pt is pleasant and cooperative, Dr. Darnelle CatalanMalinda to rescind IVC paperwork.  Pt alert and oriented x 4. No s/s of withdrawal noted at this time.  Gait remains steady.

## 2018-05-04 NOTE — ED Notes (Signed)
See vital sign print out sheet for vital signs.

## 2018-05-04 NOTE — ED Notes (Signed)
Pt up to restroom to void. Per dr. Dolores FrameSung may leave pt off cardiac monitor.

## 2018-05-24 IMAGING — CT CT HEAD W/O CM
3 series · 16 of 47 positions shown, 19 images · non-contrast
Comparison: Head CT 05/13/2017

CLINICAL DATA: Altered level of consciousness.

EXAM:
CT HEAD WITHOUT CONTRAST
TECHNIQUE: Contiguous axial images were obtained from the base of the skull
through the vertex without intravenous contrast.

[Series 3: head wo · axial · 0.44mm/px · z∈[-76,+49]mm · 10 of 30 slices shown, 13 images]
[im 3/30  brain]
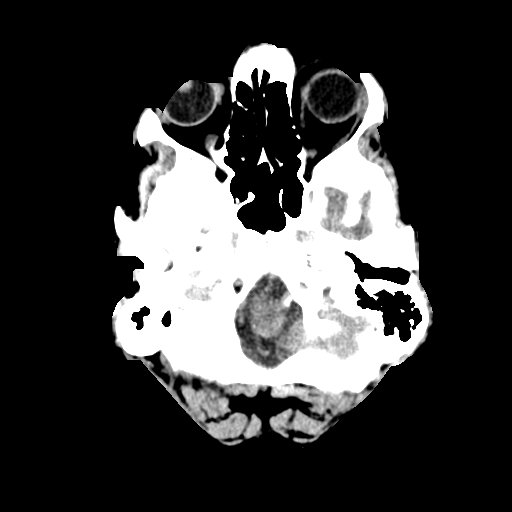
[im 3/30  bone]
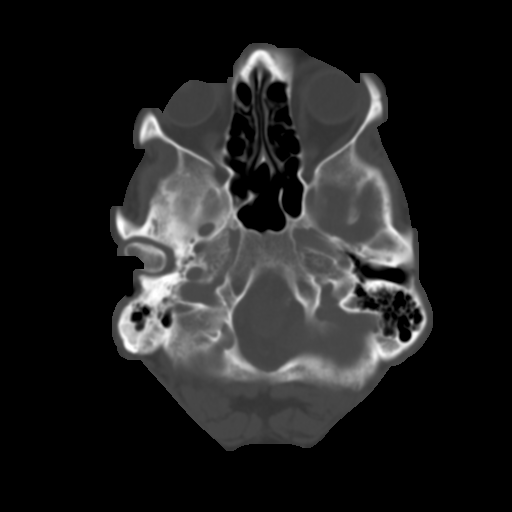
[im 6/30  brain]
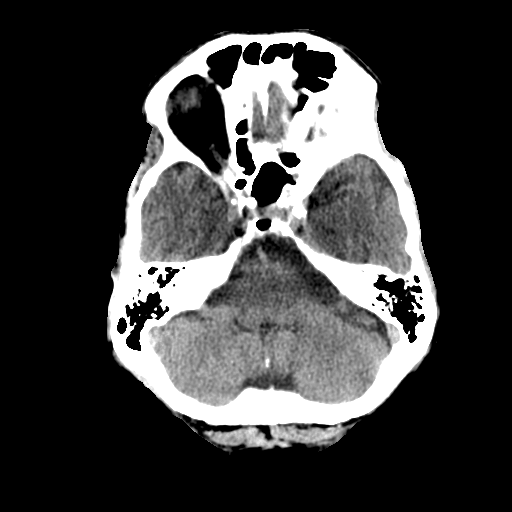
[im 9/30  brain]
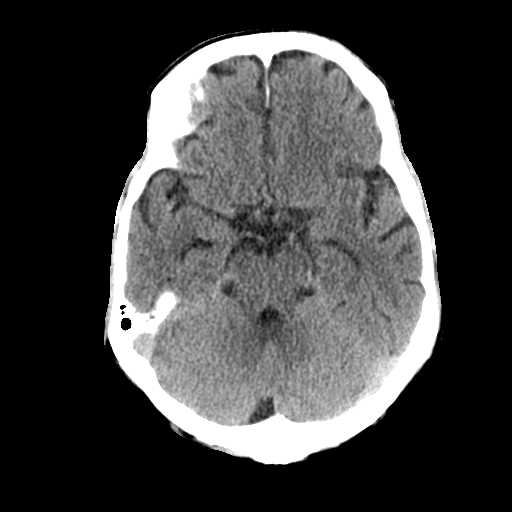
[im 11/30  brain]
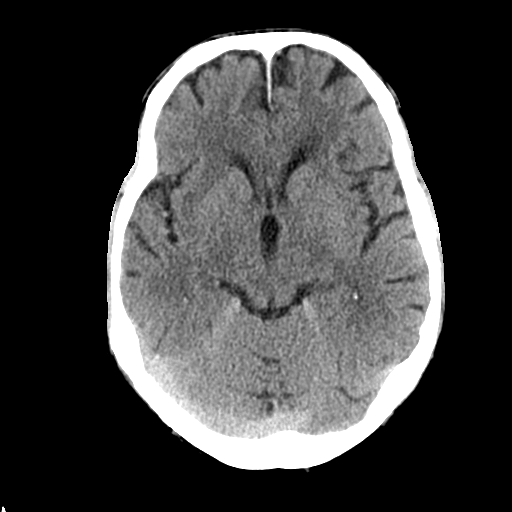
[im 14/30  brain]
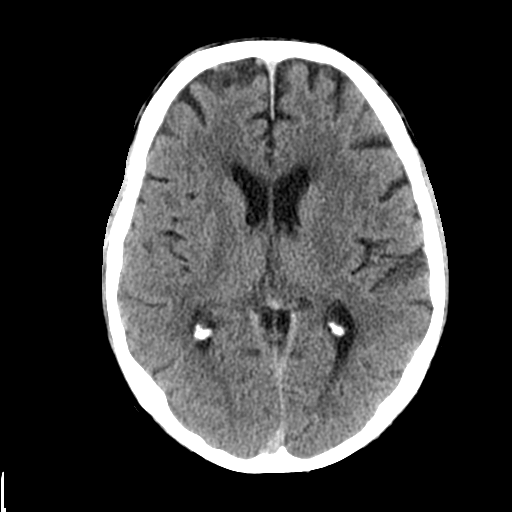
[im 14/30  bone]
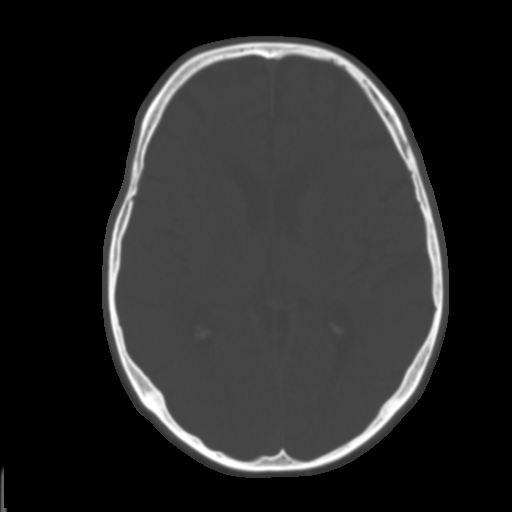
[im 17/30  brain]
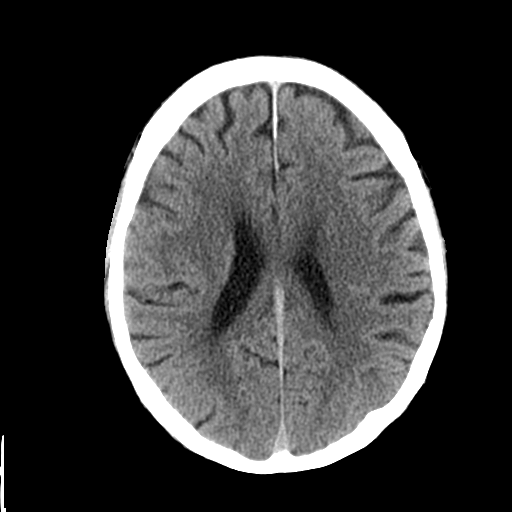
[im 20/30  brain]
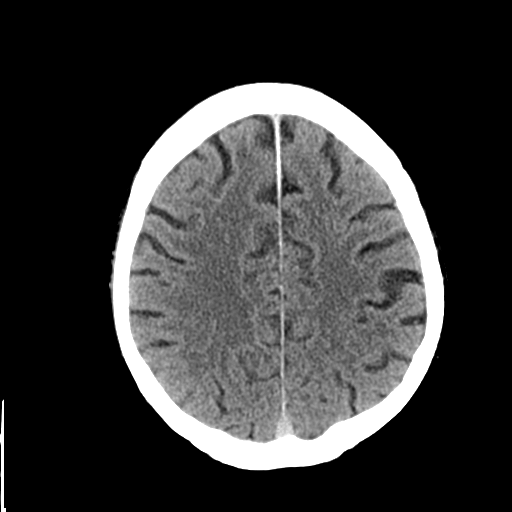
[im 23/30  brain]
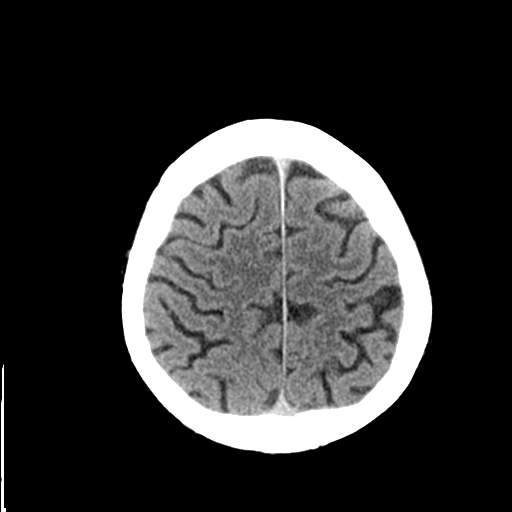
[im 25/30  brain]
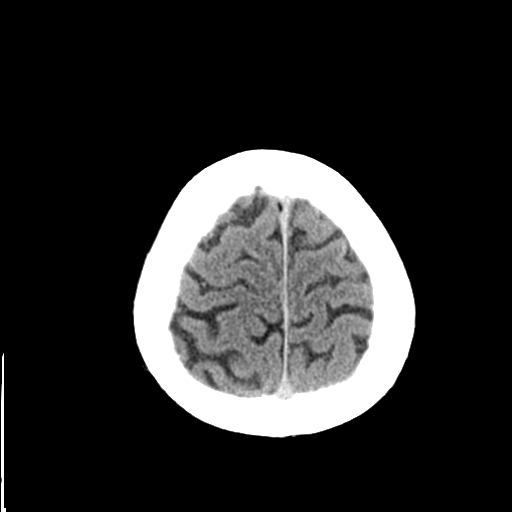
[im 25/30  bone]
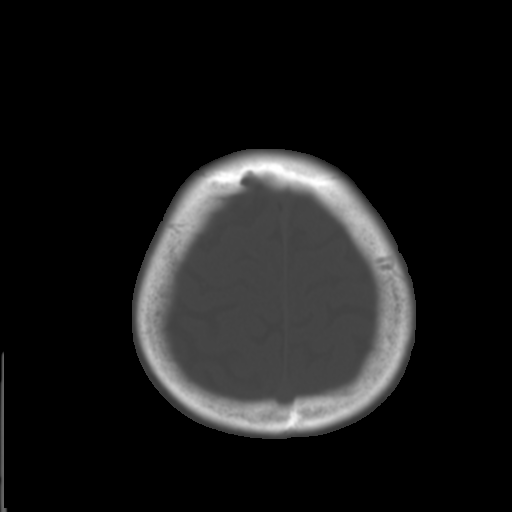
[im 28/30  brain]
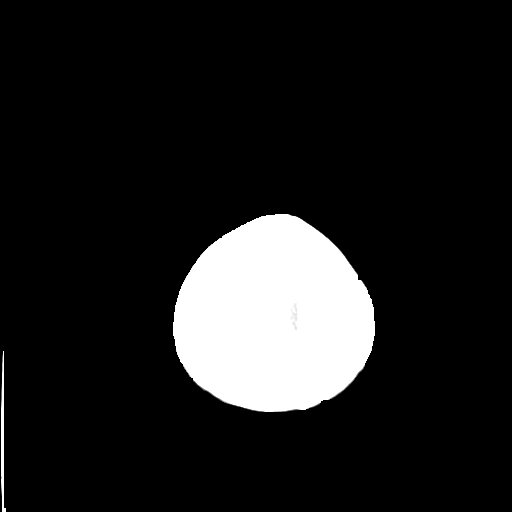

[Series 4: coronal soft tissue · coronal · 0.32mm/px · 3 of 63 slices shown]
[im 21/63  brain]
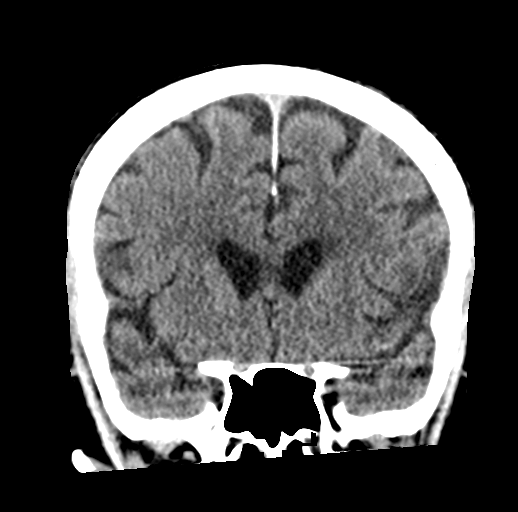
[im 28/63  brain]
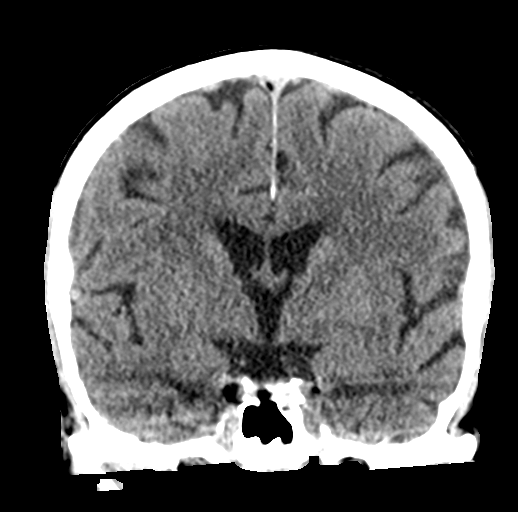
[im 35/63  brain]
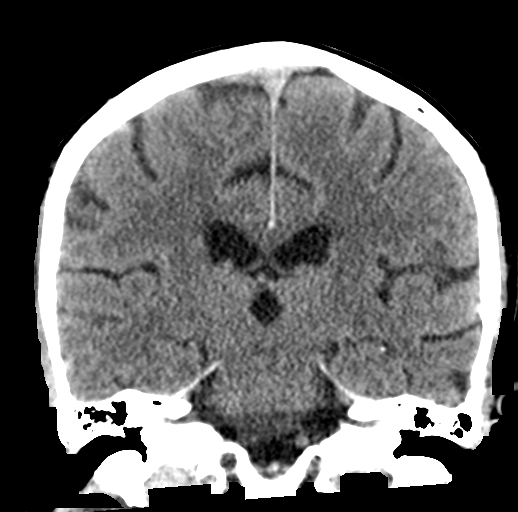

[Series 5: sagittal soft tissue · sagittal · 0.31mm/px · 3 of 56 slices shown]
[im 19/56  brain]
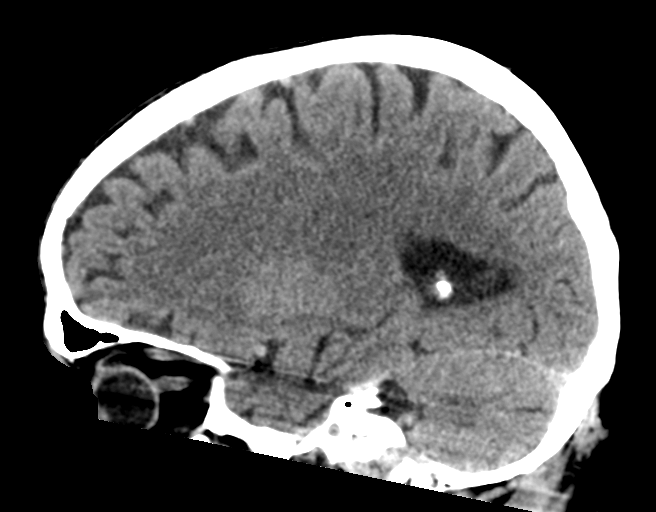
[im 28/56  brain]
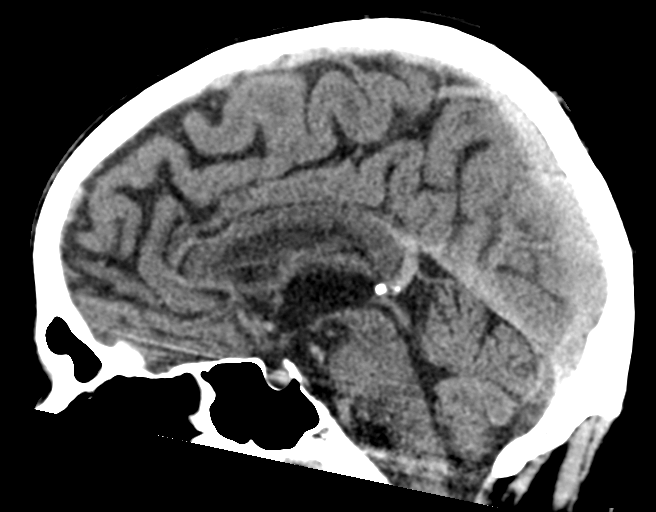
[im 37/56  brain]
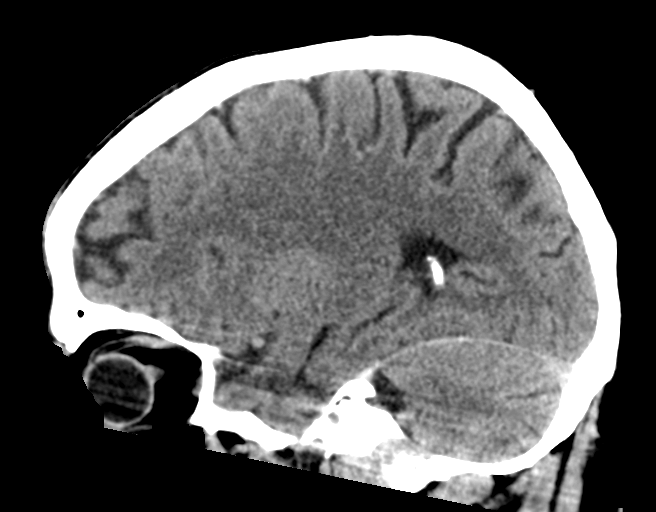

[16 of 47 positions shown; findings below may reference images not displayed]

FINDINGS: Brain: No intracranial hemorrhage, mass effect, or midline shift. No
hydrocephalus. The basilar cisterns are patent. No evidence of
territorial infarct or acute ischemia. No extra-axial or
intracranial fluid collection.

Vascular: Atherosclerosis of skullbase vasculature without
hyperdense vessel or abnormal calcification.

Skull: No skull fracture.  No focal lesion.

Sinuses/Orbits: Mild mucosal thickening of right frontal sinus,
chronic. No acute finding.

Other: None.
IMPRESSION: No acute intracranial abnormality.

## 2018-07-20 ENCOUNTER — Other Ambulatory Visit: Payer: Self-pay

## 2018-07-20 ENCOUNTER — Encounter: Payer: Self-pay | Admitting: Emergency Medicine

## 2018-07-20 ENCOUNTER — Emergency Department: Payer: Self-pay

## 2018-07-20 ENCOUNTER — Emergency Department
Admission: EM | Admit: 2018-07-20 | Discharge: 2018-07-20 | Disposition: A | Discharge status: Home / Self Care | Payer: Self-pay | Attending: Student in an Organized Health Care Education/Training Program | Admitting: Student in an Organized Health Care Education/Training Program

## 2018-07-20 DIAGNOSIS — J449 Chronic obstructive pulmonary disease, unspecified: Secondary | ICD-10-CM | POA: Insufficient documentation

## 2018-07-20 DIAGNOSIS — F10920 Alcohol use, unspecified with intoxication, uncomplicated: Secondary | ICD-10-CM

## 2018-07-20 DIAGNOSIS — Y999 Unspecified external cause status: Secondary | ICD-10-CM | POA: Insufficient documentation

## 2018-07-20 DIAGNOSIS — Y939 Activity, unspecified: Secondary | ICD-10-CM | POA: Insufficient documentation

## 2018-07-20 DIAGNOSIS — Y929 Unspecified place or not applicable: Secondary | ICD-10-CM | POA: Insufficient documentation

## 2018-07-20 DIAGNOSIS — I10 Essential (primary) hypertension: Secondary | ICD-10-CM | POA: Insufficient documentation

## 2018-07-20 DIAGNOSIS — W231XXA Caught, crushed, jammed, or pinched between stationary objects, initial encounter: Secondary | ICD-10-CM | POA: Insufficient documentation

## 2018-07-20 DIAGNOSIS — S6010XA Contusion of unspecified finger with damage to nail, initial encounter: Secondary | ICD-10-CM

## 2018-07-20 DIAGNOSIS — R079 Chest pain, unspecified: Secondary | ICD-10-CM

## 2018-07-20 DIAGNOSIS — F17200 Nicotine dependence, unspecified, uncomplicated: Secondary | ICD-10-CM | POA: Insufficient documentation

## 2018-07-20 DIAGNOSIS — F1910 Other psychoactive substance abuse, uncomplicated: Secondary | ICD-10-CM

## 2018-07-20 DIAGNOSIS — F1410 Cocaine abuse, uncomplicated: Secondary | ICD-10-CM

## 2018-07-20 LAB — COMPREHENSIVE METABOLIC PANEL
ALT: 54 U/L — ABNORMAL HIGH (ref 0–44)
AST: 72 U/L — ABNORMAL HIGH (ref 15–41)
Albumin: 4.3 g/dL (ref 3.5–5.0)
Alkaline Phosphatase: 81 U/L (ref 38–126)
Anion gap: 14 (ref 5–15)
BILIRUBIN TOTAL: 0.6 mg/dL (ref 0.3–1.2)
BUN: 7 mg/dL (ref 6–20)
CHLORIDE: 102 mmol/L (ref 98–111)
CO2: 23 mmol/L (ref 22–32)
Calcium: 9.1 mg/dL (ref 8.9–10.3)
Creatinine, Ser: 0.71 mg/dL (ref 0.61–1.24)
Glucose, Bld: 133 mg/dL — ABNORMAL HIGH (ref 70–99)
POTASSIUM: 3.2 mmol/L — AB (ref 3.5–5.1)
SODIUM: 139 mmol/L (ref 135–145)
TOTAL PROTEIN: 8.3 g/dL — AB (ref 6.5–8.1)

## 2018-07-20 LAB — CBC WITH DIFFERENTIAL/PLATELET
Abs Immature Granulocytes: 0.02 K/uL (ref 0.00–0.07)
Basophils Absolute: 0 K/uL (ref 0.0–0.1)
Basophils Relative: 0 %
Eosinophils Absolute: 0 K/uL (ref 0.0–0.5)
Eosinophils Relative: 1 %
HCT: 41.5 % (ref 39.0–52.0)
Hemoglobin: 14.3 g/dL (ref 13.0–17.0)
Immature Granulocytes: 0 %
Lymphocytes Relative: 41 %
Lymphs Abs: 1.9 K/uL (ref 0.7–4.0)
MCH: 32.6 pg (ref 26.0–34.0)
MCHC: 34.5 g/dL (ref 30.0–36.0)
MCV: 94.7 fL (ref 80.0–100.0)
Monocytes Absolute: 0.5 K/uL (ref 0.1–1.0)
Monocytes Relative: 12 %
Neutro Abs: 2.1 K/uL (ref 1.7–7.7)
Neutrophils Relative %: 46 %
Platelets: 140 K/uL — ABNORMAL LOW (ref 150–400)
RBC: 4.38 MIL/uL (ref 4.22–5.81)
RDW: 12.5 % (ref 11.5–15.5)
WBC: 4.5 K/uL (ref 4.0–10.5)
nRBC: 0 % (ref 0.0–0.2)

## 2018-07-20 LAB — URINE DRUG SCREEN, QUALITATIVE (ARMC ONLY)
Amphetamines, Ur Screen: NOT DETECTED
Barbiturates, Ur Screen: NOT DETECTED
Benzodiazepine, Ur Scrn: NOT DETECTED
Cannabinoid 50 Ng, Ur ~~LOC~~: NOT DETECTED
Cocaine Metabolite,Ur ~~LOC~~: POSITIVE — AB
MDMA (Ecstasy)Ur Screen: NOT DETECTED
Methadone Scn, Ur: NOT DETECTED
Opiate, Ur Screen: NOT DETECTED
Phencyclidine (PCP) Ur S: NOT DETECTED
Tricyclic, Ur Screen: NOT DETECTED

## 2018-07-20 LAB — TROPONIN I

## 2018-07-20 MED ORDER — OXYCODONE-ACETAMINOPHEN 5-325 MG PO TABS
1.0000 | ORAL_TABLET | Freq: Once | ORAL | Status: AC
  Administered 2018-07-20: 1 via ORAL
  Filled 2018-07-20: qty 1

## 2018-07-20 MED ORDER — ACETAMINOPHEN 500 MG PO TABS
1000.0000 mg | ORAL_TABLET | Freq: Once | ORAL | Status: AC
  Administered 2018-07-20: 1000 mg via ORAL
  Filled 2018-07-20: qty 2

## 2018-07-20 MED ORDER — BUPIVACAINE HCL 0.5 % IJ SOLN
50.0000 mL | Freq: Once | INTRAMUSCULAR | Status: AC
  Administered 2018-07-20: 50 mL

## 2018-07-20 MED ORDER — LORAZEPAM 2 MG/ML IJ SOLN
0.5000 mg | Freq: Once | INTRAMUSCULAR | Status: AC
  Administered 2018-07-20: 0.5 mg via INTRAVENOUS
  Filled 2018-07-20: qty 1

## 2018-07-20 MED ORDER — BUPIVACAINE HCL (PF) 0.5 % IJ SOLN
INTRAMUSCULAR | Status: AC
  Filled 2018-07-20: qty 30

## 2018-07-20 MED ORDER — IPRATROPIUM-ALBUTEROL 0.5-2.5 (3) MG/3ML IN SOLN
3.0000 mL | Freq: Once | RESPIRATORY_TRACT | Status: AC
  Administered 2018-07-20: 3 mL via RESPIRATORY_TRACT
  Filled 2018-07-20: qty 3

## 2018-07-20 MED ORDER — OXYCODONE HCL 5 MG PO TABS
5.0000 mg | ORAL_TABLET | ORAL | Status: DC | PRN
  Administered 2018-07-20: 5 mg via ORAL
  Filled 2018-07-20: qty 1

## 2018-07-20 NOTE — ED Triage Notes (Signed)
Pt arrives via [facility] with c/o SOB. Pt reports that he has about 5 beers on board at this time. Per EMS, pt was hypertensive at 196/108 upon scene and 100% on RA. Pt reports "possible COPD". Pt is in NAD.

## 2018-07-20 NOTE — ED Provider Notes (Signed)
[facility] Emergency Department Provider Note    First MD Initiated Contact with Patient 07/20/18 0236     (approximate)  I have reviewed the triage vital signs and the nursing notes.   HISTORY  Chief Complaint COPD and Alcohol Intoxication    HPI ABIMAEL ZEITER is a 57 y.o. male history of hep C hypertension and polysubstance abuse presents the ER for chest pain and thumb pain.  Patient does endorse drinking alcohol today.  States that he slammed his thumb in a car door.  Started having chest pain after that.  States he has had similar episodes of chest pain in the past.  States the pain in his left thumb is a primary concern.  States he does feel short of breath.  Does smoke.  No history of bronchitis or COPD the patient states that there "working on a diagnosis of COPD."      Past Medical History:  Diagnosis Date  . Hep C w/o coma, chronic (HCC)   . Hypertension   . Polysubstance abuse (HCC)    No family history on file. History reviewed. No pertinent surgical history. Patient Active Problem List   Diagnosis Date Noted  . 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 Substance induced mood disorder (HCC) 11/03/2015  . Alcohol use disorder, severe, dependence (HCC) 10/24/2015  . Alcohol withdrawal (HCC) 10/24/2015  . Stimulant use disorder (HCC) (cocaine) 10/24/2015  . Tobacco use disorder 10/24/2015  . Hepatitis C 09/06/2015      Prior to Admission medications   Not on File    Allergies Patient has no known allergies.    Social History Social History   Tobacco Use  . Smoking status: Current Every Day Smoker    Packs/day: 0.50    Years: 45.00    Pack years: 22.50  . Smokeless tobacco: Never Used  Substance Use Topics  . Alcohol use: Yes    Alcohol/week: 60.0 standard drinks    Types: 60 Cans of  beer per week    Comment: 12 pack per day  . Drug use: Yes    Types: Cocaine    Comment: yesterday    Review of Systems Patient denies headaches, rhinorrhea, blurry vision, numbness, shortness of breath, chest pain, edema, cough, abdominal pain, nausea, vomiting, diarrhea, dysuria, fevers, rashes or hallucinations unless otherwise stated above in HPI. ____________________________________________   PHYSICAL EXAM:  VITAL SIGNS: Vitals:   07/20/18 0700 07/20/18 0715  BP: (!) 156/97 (!) 151/94  Pulse: 86 77  Resp: 12 13  Temp:    SpO2: 96% 97%    Constitutional: Alert and oriented. Smells of alcohol Eyes: Conjunctivae are normal.  Head: Atraumatic. Nose: No congestion/rhinnorhea. Mouth/Throat: Mucous membranes are moist.   Neck: No stridor. Painless ROM.  Cardiovascular: Normal rate, regular rhythm. Grossly normal heart sounds.  Good peripheral circulation. Respiratory: Normal respiratory effort.  No retractions. Lungs CTAB. Gastrointestinal: Soft and nontender. No distention. No abdominal bruits. No CVA tenderness. Genitourinary:  Musculoskeletal: No lower extremity tenderness nor edema.  No joint effusions.  Pain of left thumb with 80% subungual hematoma and superficial abrasion.  Hemostatic.  No other deformity noted. Neurologic:  Normal speech and language. No gross focal neurologic deficits are appreciated. No facial droop Skin:  Skin is warm, dry and intact. No rash noted. Psychiatric: Mood and affect are normal. Speech and behavior are normal.  ____________________________________________  LABS (all labs ordered are listed, but only abnormal results are displayed)  Results for orders placed or performed during the hospital encounter of 07/20/18 (from the past 24 hour(s))  Troponin I     Status: None   Collection Time: 07/20/18  2:43 AM  Result Value Ref Range   Troponin I <0.03 <0.03 ng/mL  CBC with Differential     Status: Abnormal   Collection Time: 07/20/18   2:43 AM  Result Value Ref Range   WBC 4.5 4.0 - 10.5 K/uL   RBC 4.38 4.22 - 5.81 MIL/uL   Hemoglobin 14.3 13.0 - 17.0 g/dL   HCT 60.4 54.0 - 98.1 %   MCV 94.7 80.0 - 100.0 fL   MCH 32.6 26.0 - 34.0 pg   MCHC 34.5 30.0 - 36.0 g/dL   RDW 19.1 47.8 - 29.5 %   Platelets 140 (L) 150 - 400 K/uL   nRBC 0.0 0.0 - 0.2 %   Neutrophils Relative % 46 %   Neutro Abs 2.1 1.7 - 7.7 K/uL   Lymphocytes Relative 41 %   Lymphs Abs 1.9 0.7 - 4.0 K/uL   Monocytes Relative 12 %   Monocytes Absolute 0.5 0.1 - 1.0 K/uL   Eosinophils Relative 1 %   Eosinophils Absolute 0.0 0.0 - 0.5 K/uL   Basophils Relative 0 %   Basophils Absolute 0.0 0.0 - 0.1 K/uL   Immature Granulocytes 0 %   Abs Immature Granulocytes 0.02 0.00 - 0.07 K/uL  Comprehensive metabolic panel     Status: Abnormal   Collection Time: 07/20/18  2:43 AM  Result Value Ref Range   Sodium 139 135 - 145 mmol/L   Potassium 3.2 (L) 3.5 - 5.1 mmol/L   Chloride 102 98 - 111 mmol/L   CO2 23 22 - 32 mmol/L   Glucose, Bld 133 (H) 70 - 99 mg/dL   BUN 7 6 - 20 mg/dL   Creatinine, Ser 6.21 0.61 - 1.24 mg/dL   Calcium 9.1 8.9 - 30.8 mg/dL   Total Protein 8.3 (H) 6.5 - 8.1 g/dL   Albumin 4.3 3.5 - 5.0 g/dL   AST 72 (H) 15 - 41 U/L   ALT 54 (H) 0 - 44 U/L   Alkaline Phosphatase 81 38 - 126 U/L   Total Bilirubin 0.6 0.3 - 1.2 mg/dL   GFR calc non Af Amer >60 >60 mL/min   GFR calc Af Amer >60 >60 mL/min   Anion gap 14 5 - 15  Urine Drug Screen, Qualitative ([facility] only)     Status: Abnormal   Collection Time: 07/20/18  2:44 AM  Result Value Ref Range   Tricyclic, Ur Screen NONE DETECTED NONE DETECTED   Amphetamines, Ur Screen NONE DETECTED NONE DETECTED   MDMA (Ecstasy)Ur Screen NONE DETECTED NONE DETECTED   Cocaine Metabolite,Ur [place] POSITIVE (A) NONE DETECTED   Opiate, Ur Screen NONE DETECTED NONE DETECTED   Phencyclidine (PCP) Ur S NONE DETECTED NONE DETECTED   Cannabinoid 50 Ng, Ur [place] NONE DETECTED NONE DETECTED   Barbiturates, Ur Screen NONE  DETECTED NONE DETECTED   Benzodiazepine, Ur Scrn NONE DETECTED NONE DETECTED   Methadone Scn, Ur NONE DETECTED NONE DETECTED  Troponin I     Status: None   Collection Time: 07/20/18  5:40 AM  Result Value Ref Range   Troponin I <0.03 <0.03 ng/mL   ____________________________________________  EKG My review and personal interpretation at Time: 2:34   Indication: chest pain  Rate: 80 Rhythm: sinus  Axis:  normal Other: rbbb, nonspecific st abn, no stemi ____________________________________________  RADIOLOGY  I personally reviewed all radiographic images ordered to evaluate for the above acute complaints and reviewed radiology reports and findings.  These findings were personally discussed with the patient.  Please see medical record for radiology report.  ____________________________________________   PROCEDURES  Procedure(s) performed:  Procedures    Critical Care performed: no ____________________________________________   INITIAL IMPRESSION / ASSESSMENT AND PLAN / ED COURSE  Pertinent labs & imaging results that were available during my care of the patient were reviewed by me and considered in my medical decision making (see chart for details).   DDX: Polysubstance abuse, alcohol ingestion, cocaine abuse, fracture, dislocation, ACS, COPD, reflux, doubt PE or dissection  CEZAR MISIASZEK is a 57 y.o. who presents to the ED with was as described above.  Mildly hypertensive.  Will give pain medication as this is likely driven from pain of left thumb.  No evidence of fracture dislocation.  EKG shows no evidence of EMEA.  Based on his pain will serial enzymes.  Do suspect large component of his presentation related to polysubstance abuse nevertheless given his age and risk factors will place on cardiac monitor and evaluate the patient for underlying cardiac or pulmonary pathology.  Clinical Course as of Jul 20 746  Wynelle Link Jul 20, 2018  1610 Blood work is reassuring.  No signs of  ACS.  No evidence of fracture.  Will provide basic wound care.   [PR]  9604 repeat troponin negative.  She is asking for additional pain medications.  Will give another dose here but will not prescribe any additional pain medications going home as he is testing positive for cocaine.   [PR]    Clinical Course User Index [PR] Willy Eddy, MD     As part of my medical decision making, I reviewed the following data within the electronic MEDICAL RECORD NUMBER Nursing notes reviewed and incorporated, Labs reviewed, notes from prior ED visits and [place] Controlled Substance Database   ____________________________________________   FINAL CLINICAL IMPRESSION(S) / ED DIAGNOSES  Final diagnoses:  Alcoholic intoxication without complication (HCC)  Polysubstance abuse (HCC)  Cocaine abuse (HCC)  Chest pain, unspecified type  Subungual hematoma of finger of left hand, initial encounter      NEW MEDICATIONS STARTED DURING THIS VISIT:  There are no discharge medications for this patient.    Note:  This document was prepared using Dragon voice recognition software and may include unintentional dictation errors.    Willy Eddy, MD 07/20/18 [phone]

## 2018-09-15 IMAGING — CR DG CHEST 2V
3 series · 3 of 3 positions shown · non-contrast
Comparison: 01/12/2017

CLINICAL DATA: Chest pain

EXAM:
CHEST  2 VIEW

[chest pa]
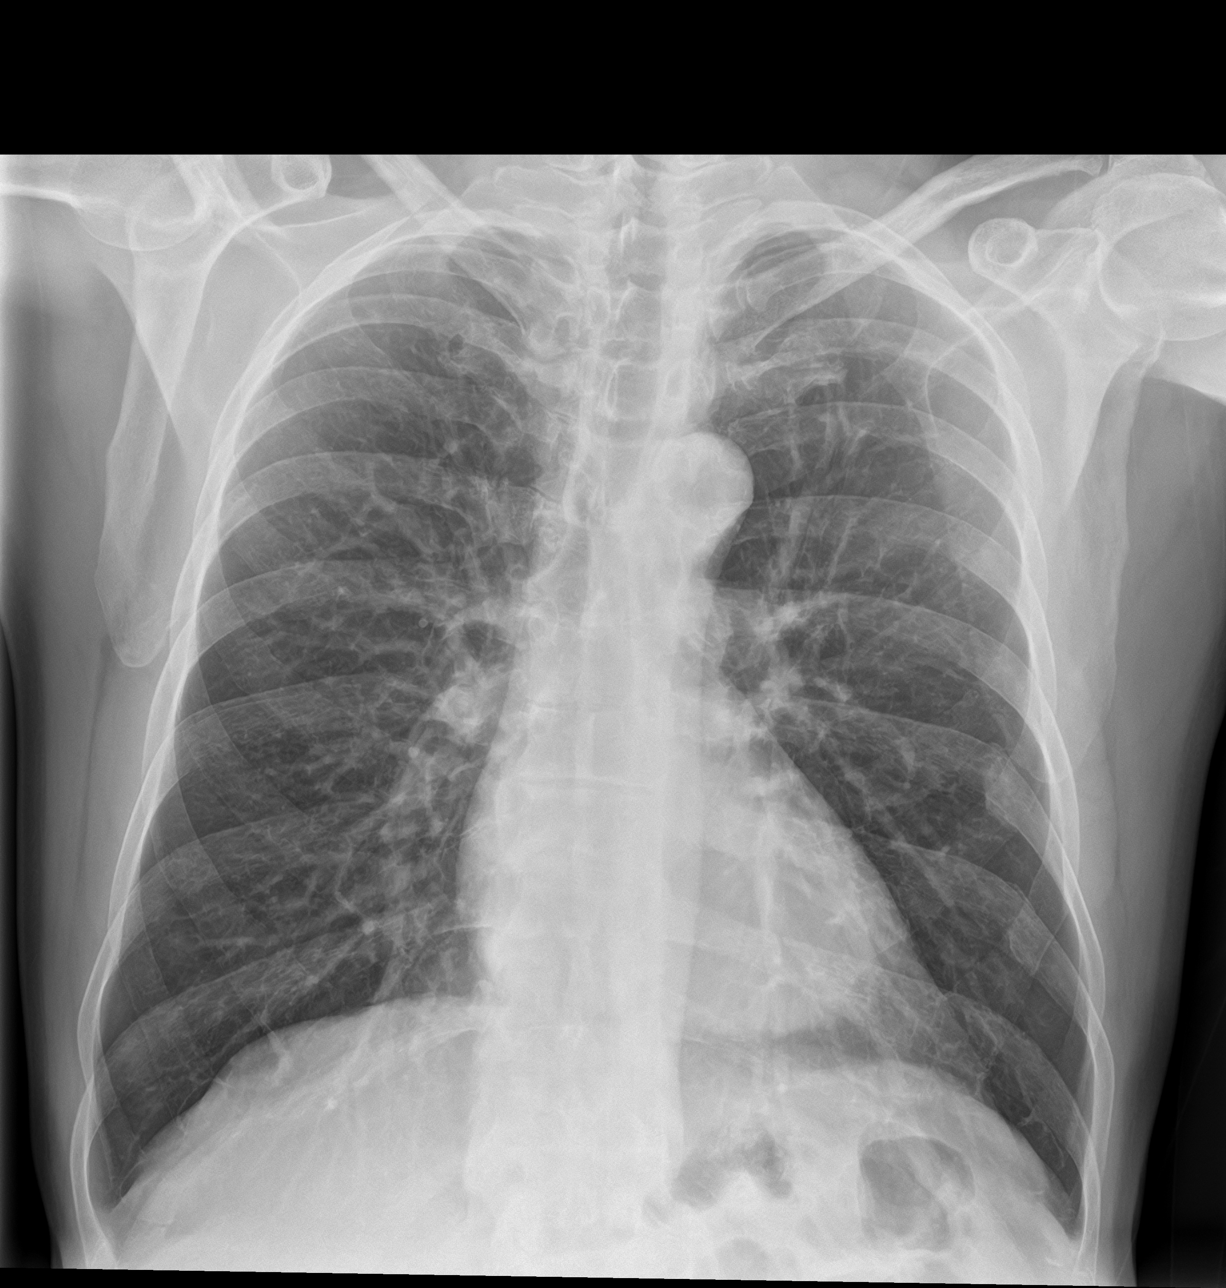

[chest lat (1 of 2)]
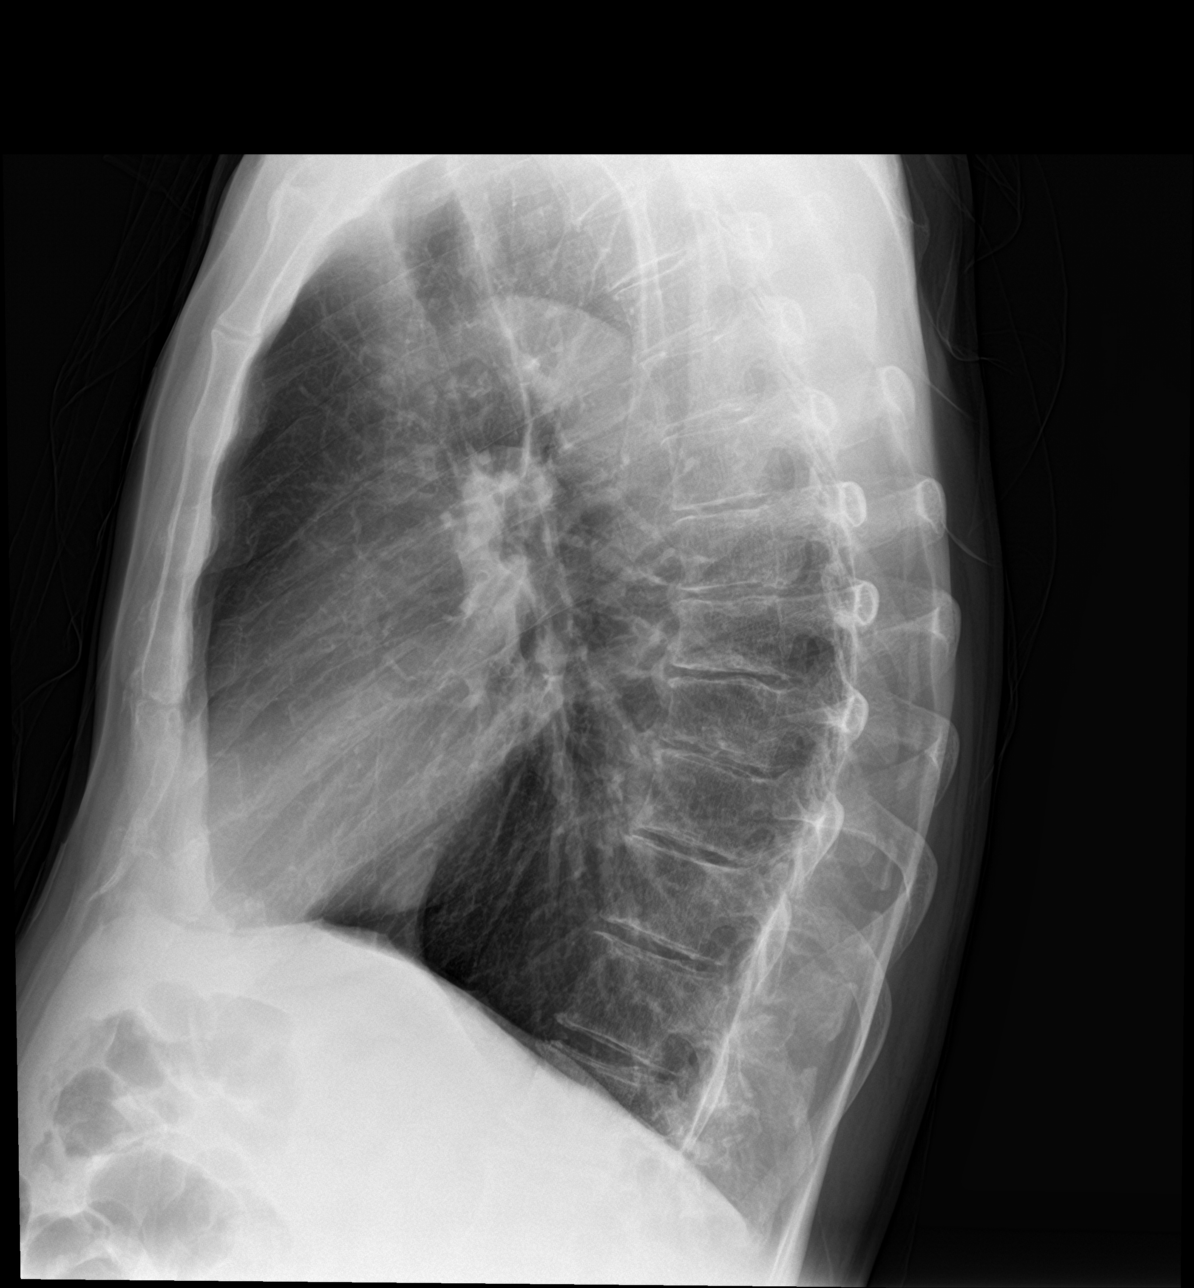

[chest lat (2 of 2)]
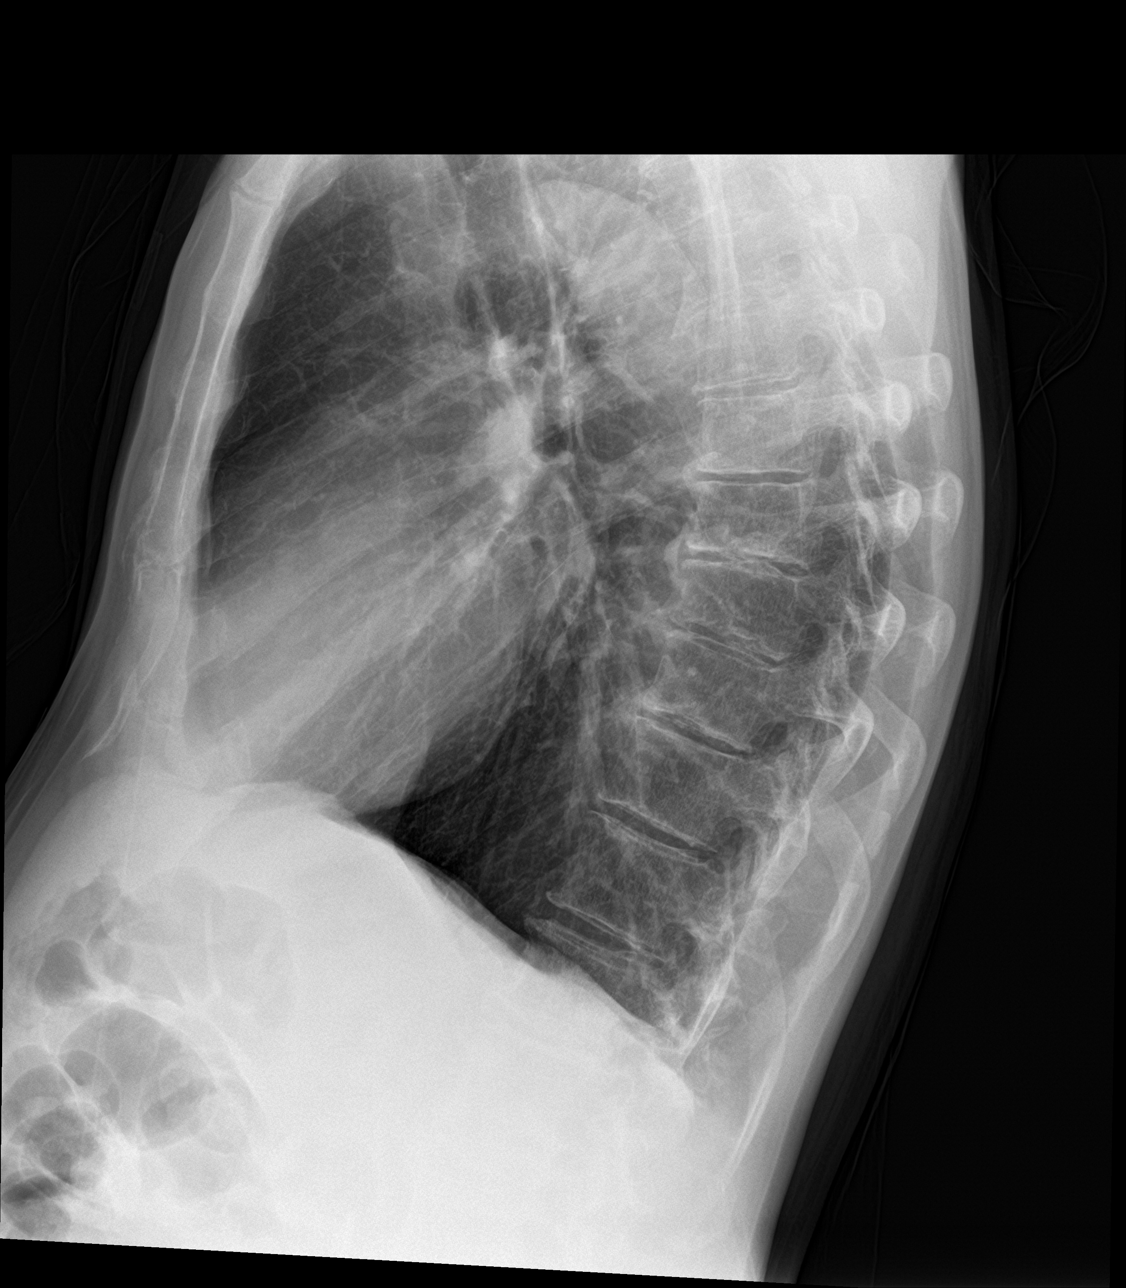

[3 of 3 positions shown; findings below may reference images not displayed]

FINDINGS: Lungs are hyperexpanded. Interstitial markings are diffusely
coarsened with chronic features. The lungs are clear without focal
pneumonia, edema, pneumothorax or pleural effusion. The
cardiopericardial silhouette is within normal limits for size. The
visualized bony structures of the thorax are intact.
IMPRESSION: Stable.  No acute cardiopulmonary findings.

## 2018-09-19 ENCOUNTER — Emergency Department
Admission: EM | Admit: 2018-09-19 | Discharge: 2018-09-19 | Disposition: A | Discharge status: Home / Self Care | Payer: Self-pay | Attending: Emergency Medicine | Admitting: Emergency Medicine

## 2018-09-19 ENCOUNTER — Emergency Department: Payer: Self-pay

## 2018-09-19 DIAGNOSIS — Y929 Unspecified place or not applicable: Secondary | ICD-10-CM | POA: Insufficient documentation

## 2018-09-19 DIAGNOSIS — S0083XA Contusion of other part of head, initial encounter: Secondary | ICD-10-CM | POA: Insufficient documentation

## 2018-09-19 DIAGNOSIS — R42 Dizziness and giddiness: Secondary | ICD-10-CM | POA: Insufficient documentation

## 2018-09-19 DIAGNOSIS — F17210 Nicotine dependence, cigarettes, uncomplicated: Secondary | ICD-10-CM | POA: Insufficient documentation

## 2018-09-19 DIAGNOSIS — H538 Other visual disturbances: Secondary | ICD-10-CM | POA: Insufficient documentation

## 2018-09-19 DIAGNOSIS — I10 Essential (primary) hypertension: Secondary | ICD-10-CM | POA: Insufficient documentation

## 2018-09-19 DIAGNOSIS — Y998 Other external cause status: Secondary | ICD-10-CM | POA: Insufficient documentation

## 2018-09-19 DIAGNOSIS — F1410 Cocaine abuse, uncomplicated: Secondary | ICD-10-CM | POA: Insufficient documentation

## 2018-09-19 DIAGNOSIS — Y9389 Activity, other specified: Secondary | ICD-10-CM | POA: Insufficient documentation

## 2018-09-19 DIAGNOSIS — S0990XA Unspecified injury of head, initial encounter: Secondary | ICD-10-CM | POA: Insufficient documentation

## 2018-09-19 DIAGNOSIS — F10920 Alcohol use, unspecified with intoxication, uncomplicated: Secondary | ICD-10-CM | POA: Insufficient documentation

## 2018-09-19 DIAGNOSIS — S022XXA Fracture of nasal bones, initial encounter for closed fracture: Secondary | ICD-10-CM | POA: Insufficient documentation

## 2018-09-19 DIAGNOSIS — Z23 Encounter for immunization: Secondary | ICD-10-CM | POA: Insufficient documentation

## 2018-09-19 LAB — CBC WITH DIFFERENTIAL/PLATELET
Abs Immature Granulocytes: 0.06 K/uL (ref 0.00–0.07)
Basophils Absolute: 0.1 K/uL (ref 0.0–0.1)
Basophils Relative: 0 %
Eosinophils Absolute: 0 K/uL (ref 0.0–0.5)
Eosinophils Relative: 0 %
HCT: 45.2 % (ref 39.0–52.0)
Hemoglobin: 15.4 g/dL (ref 13.0–17.0)
Immature Granulocytes: 1 %
Lymphocytes Relative: 18 %
Lymphs Abs: 2.1 K/uL (ref 0.7–4.0)
MCH: 32.5 pg (ref 26.0–34.0)
MCHC: 34.1 g/dL (ref 30.0–36.0)
MCV: 95.4 fL (ref 80.0–100.0)
Monocytes Absolute: 0.7 K/uL (ref 0.1–1.0)
Monocytes Relative: 6 %
Neutro Abs: 8.9 K/uL — ABNORMAL HIGH (ref 1.7–7.7)
Neutrophils Relative %: 75 %
Platelets: 145 K/uL — ABNORMAL LOW (ref 150–400)
RBC: 4.74 MIL/uL (ref 4.22–5.81)
RDW: 12.1 % (ref 11.5–15.5)
WBC: 11.8 K/uL — ABNORMAL HIGH (ref 4.0–10.5)
nRBC: 0 % (ref 0.0–0.2)

## 2018-09-19 LAB — COMPREHENSIVE METABOLIC PANEL
ALT: 71 U/L — AB (ref 0–44)
AST: 75 U/L — AB (ref 15–41)
Albumin: 4.8 g/dL (ref 3.5–5.0)
Alkaline Phosphatase: 81 U/L (ref 38–126)
Anion gap: 11 (ref 5–15)
BILIRUBIN TOTAL: 0.5 mg/dL (ref 0.3–1.2)
BUN: 8 mg/dL (ref 6–20)
CHLORIDE: 98 mmol/L (ref 98–111)
CO2: 25 mmol/L (ref 22–32)
CREATININE: 0.66 mg/dL (ref 0.61–1.24)
Calcium: 9.3 mg/dL (ref 8.9–10.3)
Glucose, Bld: 111 mg/dL — ABNORMAL HIGH (ref 70–99)
Potassium: 3.6 mmol/L (ref 3.5–5.1)
Sodium: 134 mmol/L — ABNORMAL LOW (ref 135–145)
TOTAL PROTEIN: 8.5 g/dL — AB (ref 6.5–8.1)

## 2018-09-19 LAB — ETHANOL: Alcohol, Ethyl (B): 258 mg/dL — ABNORMAL HIGH

## 2018-09-19 MED ORDER — OXYCODONE-ACETAMINOPHEN 5-325 MG PO TABS
1.0000 | ORAL_TABLET | Freq: Once | ORAL | Status: AC
  Administered 2018-09-19: 1 via ORAL
  Filled 2018-09-19: qty 1

## 2018-09-19 MED ORDER — OXYCODONE-ACETAMINOPHEN 5-325 MG PO TABS: 1.0000 | ORAL_TABLET | ORAL | 0 refills | Status: DC | PRN

## 2018-09-19 MED ORDER — SODIUM CHLORIDE 0.9 % IV BOLUS
1000.0000 mL | Freq: Once | INTRAVENOUS | Status: AC
  Administered 2018-09-19: 1000 mL via INTRAVENOUS

## 2018-09-19 MED ORDER — ONDANSETRON 4 MG PO TBDP: 4.0000 mg | ORAL_TABLET | Freq: Three times a day (TID) | ORAL | 0 refills | Status: DC | PRN

## 2018-09-19 MED ORDER — ONDANSETRON HCL 4 MG/2ML IJ SOLN
4.0000 mg | Freq: Once | INTRAMUSCULAR | Status: AC
  Administered 2018-09-19: 4 mg via INTRAVENOUS

## 2018-09-19 MED ORDER — MORPHINE SULFATE (PF) 2 MG/ML IV SOLN
2.0000 mg | Freq: Once | INTRAVENOUS | Status: AC
  Administered 2018-09-19: 2 mg via INTRAVENOUS

## 2018-09-19 MED ORDER — TETANUS-DIPHTH-ACELL PERTUSSIS 5-2.5-18.5 LF-MCG/0.5 IM SUSP
0.5000 mL | Freq: Once | INTRAMUSCULAR | Status: AC
  Administered 2018-09-19: 0.5 mL via INTRAMUSCULAR
  Filled 2018-09-19: qty 0.5

## 2018-09-19 MED ORDER — FENTANYL CITRATE (PF) 100 MCG/2ML IJ SOLN
INTRAMUSCULAR | Status: AC
  Filled 2018-09-19: qty 2

## 2018-09-19 MED ORDER — THIAMINE HCL 100 MG/ML IJ SOLN
Freq: Once | INTRAVENOUS | Status: AC
  Administered 2018-09-19: 04:00:00 via INTRAVENOUS
  Filled 2018-09-19: qty 1000

## 2018-09-19 MED ORDER — CEPHALEXIN 500 MG PO CAPS: 500.0000 mg | ORAL_CAPSULE | Freq: Three times a day (TID) | ORAL | 0 refills | Status: DC

## 2018-09-19 MED ORDER — CEFAZOLIN SODIUM-DEXTROSE 1-4 GM/50ML-% IV SOLN
1.0000 g | Freq: Once | INTRAVENOUS | Status: AC
  Administered 2018-09-19: 1 g via INTRAVENOUS
  Filled 2018-09-19: qty 50

## 2018-09-19 MED ORDER — FENTANYL CITRATE (PF) 100 MCG/2ML IJ SOLN
50.0000 ug | Freq: Once | INTRAMUSCULAR | Status: AC
  Administered 2018-09-19: 50 ug via INTRAVENOUS

## 2018-09-19 MED ORDER — ONDANSETRON HCL 4 MG/2ML IJ SOLN
INTRAMUSCULAR | Status: AC
  Filled 2018-09-19: qty 2

## 2018-09-19 MED ORDER — MORPHINE SULFATE (PF) 2 MG/ML IV SOLN
INTRAVENOUS | Status: AC
  Administered 2018-09-19: 2 mg via INTRAVENOUS
  Filled 2018-09-19: qty 1
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
## 2018-09-19 NOTE — Discharge Instructions (Signed)
1.  You may take pain and nausea medicines as needed (Percocet/Zofran #20). 2.  Your tetanus has been updated and will be good for 10 years. 3.  Take antibiotic as prescribed (Keflex 500 mg 3 times daily x7 days). 4.  Apply ice to affected areas several times daily to decrease swelling. 5.  Return to the ER for worsening symptoms, persistent vomiting, difficulty breathing or other concerns.

## 2018-09-19 NOTE — ED Notes (Signed)
ED Provider at bedside. 

## 2018-09-19 NOTE — ED Notes (Signed)
Called pharmacy to request medication 

## 2018-09-19 NOTE — ED Notes (Signed)
Reviewed discharge instructions, follow-up care, and prescriptions with patient. Patient verbalized understanding of all information reviewed. Patient stable, with no distress noted at this time.    

## 2019-01-22 ENCOUNTER — Inpatient Hospital Stay (HOSPITAL_COMMUNITY)
Admission: EM | Admit: 2019-01-22 | Discharge: 2019-03-18 | DRG: 004 | Disposition: A | Discharge status: Skilled Nursing Facility | Payer: Medicaid Other | Attending: Physician Assistant | Admitting: Physician Assistant

## 2019-01-22 ENCOUNTER — Emergency Department (HOSPITAL_COMMUNITY): Payer: Medicaid Other

## 2019-01-22 ENCOUNTER — Encounter (HOSPITAL_COMMUNITY): Payer: Self-pay

## 2019-01-22 DIAGNOSIS — Z4659 Encounter for fitting and adjustment of other gastrointestinal appliance and device: Secondary | ICD-10-CM

## 2019-01-22 DIAGNOSIS — Z978 Presence of other specified devices: Secondary | ICD-10-CM

## 2019-01-22 DIAGNOSIS — J189 Pneumonia, unspecified organism: Secondary | ICD-10-CM

## 2019-01-22 DIAGNOSIS — S065X9A Traumatic subdural hemorrhage with loss of consciousness of unspecified duration, initial encounter: Secondary | ICD-10-CM

## 2019-01-22 DIAGNOSIS — R0902 Hypoxemia: Secondary | ICD-10-CM

## 2019-01-22 DIAGNOSIS — T17910A Gastric contents in respiratory tract, part unspecified causing asphyxiation, initial encounter: Secondary | ICD-10-CM

## 2019-01-22 DIAGNOSIS — T148XXA Other injury of unspecified body region, initial encounter: Secondary | ICD-10-CM

## 2019-01-22 DIAGNOSIS — F10920 Alcohol use, unspecified with intoxication, uncomplicated: Secondary | ICD-10-CM

## 2019-01-22 DIAGNOSIS — R509 Fever, unspecified: Secondary | ICD-10-CM

## 2019-01-22 DIAGNOSIS — T07XXXA Unspecified multiple injuries, initial encounter: Secondary | ICD-10-CM

## 2019-01-22 DIAGNOSIS — Z590 Homelessness: Secondary | ICD-10-CM

## 2019-01-22 DIAGNOSIS — R40241 Glasgow coma scale score 13-15, unspecified time: Secondary | ICD-10-CM | POA: Diagnosis present

## 2019-01-22 DIAGNOSIS — Z1639 Resistance to other specified antimicrobial drug: Secondary | ICD-10-CM | POA: Diagnosis present

## 2019-01-22 DIAGNOSIS — Z9289 Personal history of other medical treatment: Secondary | ICD-10-CM

## 2019-01-22 DIAGNOSIS — S0101XA Laceration without foreign body of scalp, initial encounter: Secondary | ICD-10-CM | POA: Diagnosis present

## 2019-01-22 DIAGNOSIS — K2900 Acute gastritis without bleeding: Secondary | ICD-10-CM | POA: Diagnosis present

## 2019-01-22 DIAGNOSIS — Z0189 Encounter for other specified special examinations: Secondary | ICD-10-CM

## 2019-01-22 DIAGNOSIS — Z930 Tracheostomy status: Secondary | ICD-10-CM

## 2019-01-22 DIAGNOSIS — S01511A Laceration without foreign body of lip, initial encounter: Secondary | ICD-10-CM | POA: Diagnosis present

## 2019-01-22 DIAGNOSIS — Z9911 Dependence on respirator [ventilator] status: Secondary | ICD-10-CM

## 2019-01-22 DIAGNOSIS — B955 Unspecified streptococcus as the cause of diseases classified elsewhere: Secondary | ICD-10-CM | POA: Diagnosis not present

## 2019-01-22 DIAGNOSIS — B965 Pseudomonas (aeruginosa) (mallei) (pseudomallei) as the cause of diseases classified elsewhere: Secondary | ICD-10-CM | POA: Diagnosis present

## 2019-01-22 DIAGNOSIS — Z781 Physical restraint status: Secondary | ICD-10-CM

## 2019-01-22 DIAGNOSIS — K117 Disturbances of salivary secretion: Secondary | ICD-10-CM

## 2019-01-22 DIAGNOSIS — J95851 Ventilator associated pneumonia: Secondary | ICD-10-CM

## 2019-01-22 DIAGNOSIS — F10120 Alcohol abuse with intoxication, uncomplicated: Secondary | ICD-10-CM | POA: Diagnosis present

## 2019-01-22 DIAGNOSIS — Z751 Person awaiting admission to adequate facility elsewhere: Secondary | ICD-10-CM

## 2019-01-22 DIAGNOSIS — S36031A Moderate laceration of spleen, initial encounter: Secondary | ICD-10-CM | POA: Diagnosis present

## 2019-01-22 DIAGNOSIS — S36428A Contusion of other part of small intestine, initial encounter: Secondary | ICD-10-CM | POA: Diagnosis present

## 2019-01-22 DIAGNOSIS — F17200 Nicotine dependence, unspecified, uncomplicated: Secondary | ICD-10-CM | POA: Diagnosis present

## 2019-01-22 DIAGNOSIS — S20319A Abrasion of unspecified front wall of thorax, initial encounter: Secondary | ICD-10-CM | POA: Diagnosis present

## 2019-01-22 DIAGNOSIS — E870 Hyperosmolality and hypernatremia: Secondary | ICD-10-CM | POA: Diagnosis not present

## 2019-01-22 DIAGNOSIS — J80 Acute respiratory distress syndrome: Secondary | ICD-10-CM | POA: Diagnosis not present

## 2019-01-22 DIAGNOSIS — I10 Essential (primary) hypertension: Secondary | ICD-10-CM | POA: Diagnosis present

## 2019-01-22 DIAGNOSIS — H1131 Conjunctival hemorrhage, right eye: Secondary | ICD-10-CM | POA: Diagnosis present

## 2019-01-22 DIAGNOSIS — R1310 Dysphagia, unspecified: Secondary | ICD-10-CM

## 2019-01-22 DIAGNOSIS — R451 Restlessness and agitation: Secondary | ICD-10-CM | POA: Diagnosis not present

## 2019-01-22 DIAGNOSIS — J690 Pneumonitis due to inhalation of food and vomit: Secondary | ICD-10-CM | POA: Diagnosis present

## 2019-01-22 DIAGNOSIS — Z931 Gastrostomy status: Secondary | ICD-10-CM

## 2019-01-22 DIAGNOSIS — R7881 Bacteremia: Secondary | ICD-10-CM | POA: Diagnosis not present

## 2019-01-22 DIAGNOSIS — J9690 Respiratory failure, unspecified, unspecified whether with hypoxia or hypercapnia: Secondary | ICD-10-CM

## 2019-01-22 DIAGNOSIS — E871 Hypo-osmolality and hyponatremia: Secondary | ICD-10-CM | POA: Diagnosis present

## 2019-01-22 DIAGNOSIS — Z01818 Encounter for other preprocedural examination: Secondary | ICD-10-CM

## 2019-01-22 DIAGNOSIS — T17918A Gastric contents in respiratory tract, part unspecified causing other injury, initial encounter: Secondary | ICD-10-CM

## 2019-01-22 DIAGNOSIS — N179 Acute kidney failure, unspecified: Secondary | ICD-10-CM | POA: Diagnosis not present

## 2019-01-22 DIAGNOSIS — Y908 Blood alcohol level of 240 mg/100 ml or more: Secondary | ICD-10-CM | POA: Diagnosis present

## 2019-01-22 DIAGNOSIS — J40 Bronchitis, not specified as acute or chronic: Secondary | ICD-10-CM | POA: Diagnosis not present

## 2019-01-22 DIAGNOSIS — Z1159 Encounter for screening for other viral diseases: Secondary | ICD-10-CM

## 2019-01-22 DIAGNOSIS — S065XAA Traumatic subdural hemorrhage with loss of consciousness status unknown, initial encounter: Secondary | ICD-10-CM | POA: Diagnosis present

## 2019-01-22 LAB — COMPREHENSIVE METABOLIC PANEL
ALT: 39 U/L (ref 0–44)
AST: 75 U/L — ABNORMAL HIGH (ref 15–41)
Albumin: 4 g/dL (ref 3.5–5.0)
Alkaline Phosphatase: 75 U/L (ref 38–126)
Anion gap: 17 — ABNORMAL HIGH (ref 5–15)
BUN: 9 mg/dL (ref 6–20)
CO2: 19 mmol/L — ABNORMAL LOW (ref 22–32)
Calcium: 8.4 mg/dL — ABNORMAL LOW (ref 8.9–10.3)
Chloride: 102 mmol/L (ref 98–111)
Creatinine, Ser: 0.94 mg/dL (ref 0.61–1.24)
GFR calc Af Amer: >60 mL/min (ref >60)
GFR calc non Af Amer: >60 mL/min (ref >60)
Glucose, Bld: 203 mg/dL — ABNORMAL HIGH (ref 70–99)
Potassium: 3.1 mmol/L — ABNORMAL LOW (ref 3.5–5.1)
Sodium: 138 mmol/L (ref 135–145)
Total Bilirubin: 0.6 mg/dL (ref 0.3–1.2)
Total Protein: 7.2 g/dL (ref 6.5–8.1)

## 2019-01-22 LAB — PROTIME-INR
INR: 1 (ref 0.8–1.2)
Prothrombin Time: 13 seconds (ref 11.4–15.2)

## 2019-01-22 LAB — CBC
HCT: 41.8 % (ref 39.0–52.0)
Hemoglobin: 13.8 g/dL (ref 13.0–17.0)
MCH: 32.5 pg (ref 26.0–34.0)
MCHC: 33 g/dL (ref 30.0–36.0)
MCV: 98.6 fL (ref 80.0–100.0)
Platelets: 168 10*3/uL (ref 150–400)
RBC: 4.24 MIL/uL (ref 4.22–5.81)
RDW: 12.4 % (ref 11.5–15.5)
WBC: 9.3 10*3/uL (ref 4.0–10.5)
nRBC: 0 % (ref 0.0–0.2)

## 2019-01-22 LAB — CBG MONITORING, ED: Glucose-Capillary: 174 mg/dL — ABNORMAL HIGH (ref 70–99)

## 2019-01-22 LAB — ETHANOL: Alcohol, Ethyl (B): 300 mg/dL — ABNORMAL HIGH (ref <10)

## 2019-01-22 LAB — SAMPLE TO BLOOD BANK

## 2019-01-22 LAB — LACTIC ACID, PLASMA: Lactic Acid, Venous: 8 mmol/L (ref 0.5–1.9)
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
## 2019-01-22 NOTE — ED Notes (Signed)
Condom-cath applied. Pt resting.

## 2019-01-23 ENCOUNTER — Inpatient Hospital Stay (HOSPITAL_COMMUNITY): Payer: Medicaid Other

## 2019-01-23 ENCOUNTER — Emergency Department (HOSPITAL_COMMUNITY): Payer: Medicaid Other

## 2019-01-23 ENCOUNTER — Encounter (HOSPITAL_COMMUNITY): Payer: Self-pay | Admitting: Radiology

## 2019-01-23 DIAGNOSIS — S065X9A Traumatic subdural hemorrhage with loss of consciousness of unspecified duration, initial encounter: Secondary | ICD-10-CM

## 2019-01-23 DIAGNOSIS — R509 Fever, unspecified: Secondary | ICD-10-CM | POA: Diagnosis not present

## 2019-01-23 DIAGNOSIS — Y908 Blood alcohol level of 240 mg/100 ml or more: Secondary | ICD-10-CM | POA: Diagnosis present

## 2019-01-23 DIAGNOSIS — E870 Hyperosmolality and hypernatremia: Secondary | ICD-10-CM | POA: Diagnosis not present

## 2019-01-23 DIAGNOSIS — S36031A Moderate laceration of spleen, initial encounter: Secondary | ICD-10-CM | POA: Diagnosis present

## 2019-01-23 DIAGNOSIS — J80 Acute respiratory distress syndrome: Secondary | ICD-10-CM | POA: Diagnosis not present

## 2019-01-23 DIAGNOSIS — B955 Unspecified streptococcus as the cause of diseases classified elsewhere: Secondary | ICD-10-CM | POA: Diagnosis not present

## 2019-01-23 DIAGNOSIS — J40 Bronchitis, not specified as acute or chronic: Secondary | ICD-10-CM | POA: Diagnosis not present

## 2019-01-23 DIAGNOSIS — K2900 Acute gastritis without bleeding: Secondary | ICD-10-CM | POA: Diagnosis present

## 2019-01-23 DIAGNOSIS — S01511A Laceration without foreign body of lip, initial encounter: Secondary | ICD-10-CM | POA: Diagnosis present

## 2019-01-23 DIAGNOSIS — S36428A Contusion of other part of small intestine, initial encounter: Secondary | ICD-10-CM | POA: Diagnosis present

## 2019-01-23 DIAGNOSIS — J690 Pneumonitis due to inhalation of food and vomit: Secondary | ICD-10-CM | POA: Diagnosis present

## 2019-01-23 DIAGNOSIS — Z1159 Encounter for screening for other viral diseases: Secondary | ICD-10-CM | POA: Diagnosis not present

## 2019-01-23 DIAGNOSIS — E871 Hypo-osmolality and hyponatremia: Secondary | ICD-10-CM | POA: Diagnosis present

## 2019-01-23 DIAGNOSIS — S0101XA Laceration without foreign body of scalp, initial encounter: Secondary | ICD-10-CM | POA: Diagnosis present

## 2019-01-23 DIAGNOSIS — Z9911 Dependence on respirator [ventilator] status: Secondary | ICD-10-CM | POA: Diagnosis not present

## 2019-01-23 DIAGNOSIS — F10120 Alcohol abuse with intoxication, uncomplicated: Secondary | ICD-10-CM | POA: Diagnosis present

## 2019-01-23 DIAGNOSIS — Z1639 Resistance to other specified antimicrobial drug: Secondary | ICD-10-CM | POA: Diagnosis present

## 2019-01-23 DIAGNOSIS — S20319A Abrasion of unspecified front wall of thorax, initial encounter: Secondary | ICD-10-CM | POA: Diagnosis present

## 2019-01-23 DIAGNOSIS — H1131 Conjunctival hemorrhage, right eye: Secondary | ICD-10-CM | POA: Diagnosis present

## 2019-01-23 DIAGNOSIS — F17200 Nicotine dependence, unspecified, uncomplicated: Secondary | ICD-10-CM | POA: Diagnosis present

## 2019-01-23 DIAGNOSIS — S065XAA Traumatic subdural hemorrhage with loss of consciousness status unknown, initial encounter: Secondary | ICD-10-CM | POA: Diagnosis present

## 2019-01-23 DIAGNOSIS — I10 Essential (primary) hypertension: Secondary | ICD-10-CM | POA: Diagnosis present

## 2019-01-23 DIAGNOSIS — N179 Acute kidney failure, unspecified: Secondary | ICD-10-CM | POA: Diagnosis not present

## 2019-01-23 DIAGNOSIS — R7881 Bacteremia: Secondary | ICD-10-CM | POA: Diagnosis not present

## 2019-01-23 DIAGNOSIS — R40241 Glasgow coma scale score 13-15, unspecified time: Secondary | ICD-10-CM | POA: Diagnosis present

## 2019-01-23 LAB — URINALYSIS, ROUTINE W REFLEX MICROSCOPIC
Bilirubin Urine: NEGATIVE
Glucose, UA: 50 mg/dL — AB
Ketones, ur: NEGATIVE mg/dL
Leukocytes,Ua: NEGATIVE
Nitrite: NEGATIVE
Protein, ur: 30 mg/dL — AB
Specific Gravity, Urine: 1.008 (ref 1.005–1.030)
pH: 5 (ref 5.0–8.0)

## 2019-01-23 LAB — CBC
HCT: 32.5 % — ABNORMAL LOW (ref 39.0–52.0)
HCT: 30.8 % — ABNORMAL LOW (ref 39.0–52.0)
HCT: 28.2 % — ABNORMAL LOW (ref 39.0–52.0)
Hemoglobin: 11.1 g/dL — ABNORMAL LOW (ref 13.0–17.0)
Hemoglobin: 10.4 g/dL — ABNORMAL LOW (ref 13.0–17.0)
Hemoglobin: 9.3 g/dL — ABNORMAL LOW (ref 13.0–17.0)
MCH: 32.7 pg (ref 26.0–34.0)
MCH: 32.5 pg (ref 26.0–34.0)
MCH: 32.3 pg (ref 26.0–34.0)
MCHC: 34.2 g/dL (ref 30.0–36.0)
MCHC: 33.8 g/dL (ref 30.0–36.0)
MCHC: 33 g/dL (ref 30.0–36.0)
MCV: 95.9 fL (ref 80.0–100.0)
MCV: 96.3 fL (ref 80.0–100.0)
MCV: 97.9 fL (ref 80.0–100.0)
Platelets: 131 10*3/uL — ABNORMAL LOW (ref 150–400)
Platelets: 115 10*3/uL — ABNORMAL LOW (ref 150–400)
Platelets: 101 10*3/uL — ABNORMAL LOW (ref 150–400)
RBC: 3.39 MIL/uL — ABNORMAL LOW (ref 4.22–5.81)
RBC: 3.2 MIL/uL — ABNORMAL LOW (ref 4.22–5.81)
RBC: 2.88 MIL/uL — ABNORMAL LOW (ref 4.22–5.81)
RDW: 12.9 % (ref 11.5–15.5)
RDW: 12.7 % (ref 11.5–15.5)
RDW: 12.8 % (ref 11.5–15.5)
WBC: 18.3 10*3/uL — ABNORMAL HIGH (ref 4.0–10.5)
WBC: 16 10*3/uL — ABNORMAL HIGH (ref 4.0–10.5)
WBC: 10.3 10*3/uL (ref 4.0–10.5)
nRBC: 0 % (ref 0.0–0.2)
nRBC: 0 % (ref 0.0–0.2)
nRBC: 0 % (ref 0.0–0.2)

## 2019-01-23 LAB — BASIC METABOLIC PANEL
Anion gap: 19 — ABNORMAL HIGH (ref 5–15)
BUN: 8 mg/dL (ref 6–20)
CO2: 16 mmol/L — ABNORMAL LOW (ref 22–32)
Calcium: 8.2 mg/dL — ABNORMAL LOW (ref 8.9–10.3)
Chloride: 106 mmol/L (ref 98–111)
Creatinine, Ser: 0.71 mg/dL (ref 0.61–1.24)
GFR calc Af Amer: >60 mL/min (ref >60)
GFR calc non Af Amer: >60 mL/min (ref >60)
Glucose, Bld: 88 mg/dL (ref 70–99)
Potassium: 3.7 mmol/L (ref 3.5–5.1)
Sodium: 141 mmol/L (ref 135–145)

## 2019-01-23 LAB — LACTIC ACID, PLASMA: Lactic Acid, Venous: 1.1 mmol/L (ref 0.5–1.9)

## 2019-01-23 LAB — MRSA PCR SCREENING: MRSA by PCR: NEGATIVE

## 2019-01-23 LAB — HIV ANTIBODY (ROUTINE TESTING W REFLEX): HIV Screen 4th Generation wRfx: NONREACTIVE

## 2019-01-23 LAB — CDS SEROLOGY

## 2019-01-23 MED ORDER — ALBUMIN HUMAN 5 % IV SOLN
12.5000 g | Freq: Once | INTRAVENOUS | Status: AC
  Administered 2019-01-23: 12.5 g via INTRAVENOUS
  Filled 2019-01-23: qty 250

## 2019-01-23 MED ORDER — IOHEXOL 300 MG/ML  SOLN
100.0000 mL | Freq: Once | INTRAMUSCULAR | Status: AC | PRN
  Administered 2019-01-23: 100 mL via INTRAVENOUS

## 2019-01-23 MED ORDER — THIAMINE HCL 100 MG/ML IJ SOLN
100.0000 mg | Freq: Every day | INTRAMUSCULAR | Status: DC
  Administered 2019-01-23 – 2019-02-11 (×15): 100 mg via INTRAVENOUS
  Filled 2019-01-23 (×17): qty 2

## 2019-01-23 MED ORDER — PANTOPRAZOLE SODIUM 40 MG PO TBEC
40.0000 mg | DELAYED_RELEASE_TABLET | Freq: Every day | ORAL | Status: DC
  Administered 2019-01-26 – 2019-02-05 (×3): 40 mg via ORAL
  Filled 2019-01-23 (×5): qty 1

## 2019-01-23 MED ORDER — HYDROMORPHONE HCL 1 MG/ML IJ SOLN
1.0000 mg | Freq: Once | INTRAMUSCULAR | Status: AC
  Administered 2019-01-23: 1 mg via INTRAVENOUS
  Filled 2019-01-23: qty 1

## 2019-01-23 MED ORDER — HYDROMORPHONE HCL 1 MG/ML IJ SOLN
1.0000 mg | INTRAMUSCULAR | Status: DC | PRN
  Administered 2019-01-24 – 2019-01-25 (×5): 1 mg via INTRAVENOUS
  Filled 2019-01-23 (×4): qty 1

## 2019-01-23 MED ORDER — LIDOCAINE-EPINEPHRINE (PF) 2 %-1:200000 IJ SOLN
10.0000 mL | Freq: Once | INTRAMUSCULAR | Status: AC
  Administered 2019-01-23: 10 mL
  Filled 2019-01-23: qty 20

## 2019-01-23 MED ORDER — ADULT MULTIVITAMIN W/MINERALS CH
1.0000 | ORAL_TABLET | Freq: Every day | ORAL | Status: DC
  Administered 2019-01-24 – 2019-01-27 (×4): 1 via ORAL
  Filled 2019-01-23 (×4): qty 1

## 2019-01-23 MED ORDER — SODIUM CHLORIDE 0.9 % IV BOLUS
1000.0000 mL | Freq: Once | INTRAVENOUS | Status: AC
  Administered 2019-01-23: 1000 mL via INTRAVENOUS

## 2019-01-23 MED ORDER — TETANUS-DIPHTH-ACELL PERTUSSIS 5-2.5-18.5 LF-MCG/0.5 IM SUSP
0.5000 mL | Freq: Once | INTRAMUSCULAR | Status: AC
  Administered 2019-01-23: 0.5 mL via INTRAMUSCULAR
  Filled 2019-01-23: qty 0.5

## 2019-01-23 MED ORDER — LORAZEPAM 1 MG PO TABS: 1.0000 mg | ORAL_TABLET | Freq: Four times a day (QID) | ORAL | Status: AC | PRN

## 2019-01-23 MED ORDER — PANTOPRAZOLE SODIUM 40 MG IV SOLR
40.0000 mg | Freq: Every day | INTRAVENOUS | Status: DC
  Administered 2019-01-23 – 2019-02-09 (×15): 40 mg via INTRAVENOUS
  Filled 2019-01-23 (×19): qty 40

## 2019-01-23 MED ORDER — LORAZEPAM 2 MG/ML IJ SOLN
0.0000 mg | Freq: Four times a day (QID) | INTRAMUSCULAR | Status: DC
  Administered 2019-01-23: 1 mg via INTRAVENOUS
  Administered 2019-01-24 (×2): 2 mg via INTRAVENOUS
  Administered 2019-01-24: 4 mg via INTRAVENOUS
  Administered 2019-01-24: 2 mg via INTRAVENOUS
  Filled 2019-01-23: qty 2
  Filled 2019-01-23 (×4): qty 1

## 2019-01-23 MED ORDER — ONDANSETRON HCL 4 MG/2ML IJ SOLN
4.0000 mg | Freq: Four times a day (QID) | INTRAMUSCULAR | Status: DC | PRN
  Administered 2019-01-23 – 2019-03-01 (×3): 4 mg via INTRAVENOUS
  Filled 2019-01-23 (×3): qty 2

## 2019-01-23 MED ORDER — POTASSIUM CHLORIDE IN NACL 20-0.9 MEQ/L-% IV SOLN
INTRAVENOUS | Status: DC
  Administered 2019-01-23 – 2019-01-28 (×10): via INTRAVENOUS
  Filled 2019-01-23 (×10): qty 1000

## 2019-01-23 MED ORDER — HYDROMORPHONE HCL 1 MG/ML IJ SOLN
2.0000 mg | INTRAMUSCULAR | Status: DC | PRN
  Administered 2019-01-23 – 2019-01-27 (×8): 2 mg via INTRAVENOUS
  Filled 2019-01-23 (×10): qty 2

## 2019-01-23 MED ORDER — ONDANSETRON 4 MG PO TBDP
4.0000 mg | ORAL_TABLET | Freq: Four times a day (QID) | ORAL | Status: DC | PRN
  Filled 2019-01-23: qty 1

## 2019-01-23 MED ORDER — LORAZEPAM 2 MG/ML IJ SOLN
0.0000 mg | Freq: Two times a day (BID) | INTRAMUSCULAR | Status: DC
  Administered 2019-01-25: 2 mg via INTRAVENOUS
  Filled 2019-01-23: qty 1

## 2019-01-23 MED ORDER — ORAL CARE MOUTH RINSE
15.0000 mL | Freq: Two times a day (BID) | OROMUCOSAL | Status: DC
  Administered 2019-01-23: 15 mL via OROMUCOSAL

## 2019-01-23 MED ORDER — FOLIC ACID 5 MG/ML IJ SOLN
1.0000 mg | Freq: Every day | INTRAMUSCULAR | Status: DC
  Administered 2019-01-23 – 2019-03-16 (×46): 1 mg via INTRAVENOUS
  Filled 2019-01-23 (×61): qty 0.2

## 2019-01-23 MED ORDER — ORAL CARE MOUTH RINSE
15.0000 mL | Freq: Two times a day (BID) | OROMUCOSAL | Status: DC
  Administered 2019-01-23 – 2019-01-26 (×7): 15 mL via OROMUCOSAL

## 2019-01-23 MED ORDER — LORAZEPAM 2 MG/ML IJ SOLN
1.0000 mg | Freq: Four times a day (QID) | INTRAMUSCULAR | Status: AC | PRN
  Administered 2019-01-25: 1 mg via INTRAVENOUS
  Filled 2019-01-23: qty 1

## 2019-01-23 MED ORDER — CHLORHEXIDINE GLUCONATE 0.12 % MT SOLN
15.0000 mL | Freq: Two times a day (BID) | OROMUCOSAL | Status: DC
  Administered 2019-01-23 – 2019-01-26 (×8): 15 mL via OROMUCOSAL
  Filled 2019-01-23 (×4): qty 15

## 2019-01-23 MED ORDER — VITAMIN B-1 100 MG PO TABS
100.0000 mg | ORAL_TABLET | Freq: Every day | ORAL | Status: DC
  Administered 2019-01-28 – 2019-02-05 (×5): 100 mg via ORAL
  Filled 2019-01-23 (×9): qty 1

## 2019-01-23 MED ORDER — FOLIC ACID 1 MG PO TABS: 1.0000 mg | ORAL_TABLET | Freq: Every day | ORAL | Status: DC
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
## 2019-01-23 NOTE — ED Provider Notes (Signed)
..  Laceration Repair Date/Time: 01/23/2019 4:30 AM Performed by: Cristina Gong, PA-C Authorized by: Cristina Gong, PA-C   Consent:    Consent obtained:  Verbal and emergent situation   Consent given by:  Patient   Risks discussed:  Infection, need for additional repair, poor cosmetic result, pain, retained foreign body, vascular damage, poor wound healing and nerve damage   Alternatives discussed:  No treatment and referral (Alternative wound closures) Anesthesia (see MAR for exact dosages):    Anesthesia method:  Local infiltration   Local anesthetic:  Lidocaine 2% WITH epi Laceration details:    Location:  Scalp   Scalp location:  Occipital   Wound length (cm): H shaped laceration, Total lenth of 5 cm.  Repair type:    Repair type:  Simple Pre-procedure details:    Preparation:  Patient was prepped and draped in usual sterile fashion Exploration:    Hemostasis achieved with:  Epinephrine and direct pressure   Wound exploration: wound explored through full range of motion and entire depth of wound probed and visualized   Treatment:    Area cleansed with:  Saline   Amount of cleaning:  Extensive Skin repair:    Repair method:  Staples   Number of staples:  7 Approximation:    Approximation:  Loose Post-procedure details:    Patient tolerance of procedure:  Tolerated well, no immediate complications .Marland KitchenLaceration Repair Date/Time: 01/23/2019 4:31 AM Performed by: Cristina Gong, PA-C Authorized by: Cristina Gong, PA-C   Consent:    Consent obtained:  Verbal and emergent situation   Consent given by:  Patient   Risks discussed:  Infection, need for additional repair, poor cosmetic result, pain, retained foreign body, tendon damage, vascular damage, poor wound healing and nerve damage   Alternatives discussed:  No treatment and referral (Alternative wound closures) Anesthesia (see MAR for exact dosages):    Anesthesia method:  Local infiltration   Local  anesthetic:  Lidocaine 2% WITH epi Laceration details:    Location:  Lip   Lip location:  Upper exterior lip   Length (cm):  3 (V shaped laceration) Repair type:    Repair type:  Intermediate Exploration:    Hemostasis achieved with:  Epinephrine and direct pressure   Wound exploration: wound explored through full range of motion and entire depth of wound probed and visualized     Contaminated: yes   Treatment:    Area cleansed with:  Saline   Amount of cleaning:  Standard Skin repair:    Repair method:  Sutures   Suture size:  5-0   Suture material:  Prolene   Suture technique:  Simple interrupted   Number of sutures:  7 Approximation:    Approximation:  Close   Vermilion border: well-aligned   Post-procedure details:    Dressing:  Open (no dressing)   Patient tolerance of procedure:  Tolerated well, no immediate complications Comments:     Wound was repaired and approximated best possible with significant underlying swelling and edema distorting many facial features, along with patient's facial hair.       Cristina Gong, PA-C 01/23/19 0433    Nira Conn, MD 01/23/19 [phone]

## 2019-01-23 NOTE — Consult Note (Signed)
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 Facial bone findings are below. 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 Healed chronic right anterior maxillary wall fracture. Poor dentition. Mandible intact and normally located. No acute maxilla or zygoma fracture. Central skull base appears stable and intact. Orbits: Intact orbital walls. Right periorbital soft tissue swelling/hematoma. Right globe remains intact. There is mild right intraorbital hematoma or contusion (series 12, image 43). Negative left orbits soft tissues. Sinuses: Well pneumatized overall. Retained blood or secretions in the nasal cavity. Small volume retained fluid in the sphenoid sinuses. Tympanic cavities and mastoids are clear. Soft tissues: Superficial soft tissue injury also along the bilateral zygoma with indistinct hematoma and contusion. Involvement also of the left parotid space and masticator space. Involvement of the left submandibular space. Asymmetric thickening of the left platysma. Negative noncontrast larynx, pharynx, parapharyngeal, retropharyngeal, sublingual,  right submandibular and parotid spaces. CT CERVICAL SPINE FINDINGS Alignment: Stable cervical lordosis. Stable mild degenerative appearing retrolisthesis of C5 on C6. Cervicothoracic junction alignment is within normal limits. Bilateral posterior element alignment is within normal limits. Skull base and vertebrae: Visualized skull base is intact. No atlanto-occipital dissociation. No acute osseous abnormality identified. Soft tissues and spinal canal: No prevertebral fluid or swelling. No visible canal hematoma. Calcified carotid atherosclerosis. Disc levels: Stable degenerative changes. Mild if any associated spinal stenosis. Upper chest: Grossly intact visible upper thoracic levels. IMPRESSION: 1. Positive for small left lateral subdural hematoma measuring 4-5 mm. Trace para falcine and tentorial subdural blood also. Trace rightward midline shift. 2. No other acute traumatic injury to the brain identified. 3. Multifocal right periorbital and bilateral face soft tissue injury and hematoma. Associated mild right intraorbital soft tissue hematoma/contusion. Right globe is intact. 4. No associated acute facial fracture. 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 5. No acute traumatic injury identified in the cervical spine. 6. Poor dentition. Electronically Signed: By: Odessa FlemingH  Hall M.D. On: 01/23/2019 00:50    Review of Systems - Negative except as per HPI    Blood pressure (!) 161/89, pulse (!) 104, temperature (!) 96.2 F (35.7 C), resp. rate 17, height 5\' 10"  (1.778 m), weight 72.6 kg, SpO2 100 %. Physical Exam  Constitutional: He is oriented to person, place, and time. He appears well-developed and well-nourished.  HENT:  Head: Normocephalic.  Multiple scalp contusions and bilateral periorbital edema  Eyes: Pupils are equal, round, and reactive to light. EOM are normal.  Neurological: He is alert and oriented to person, place, and time. He has normal strength. No cranial nerve deficit or  sensory deficit. GCS eye subscore is  4. GCS verbal subscore is 5. GCS motor subscore is 6.  MAE briskly to command.      Assessment/Plan: Subdural hematoma with abdominal trauma admitted to Trauma Service.  Patient is doing well.  Observe in hospital, repeat Head CT.  Mobilize as tolerated.  PT to work with patient.  Dorian Heckle, MD 01/23/2019, 2:28 AM
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
## 2019-01-23 NOTE — ED Notes (Signed)
Transported to CT 
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
## 2019-01-24 ENCOUNTER — Inpatient Hospital Stay (HOSPITAL_COMMUNITY): Payer: Medicaid Other

## 2019-01-24 LAB — CBC
HCT: 27.8 % — ABNORMAL LOW (ref 39.0–52.0)
HCT: 26.8 % — ABNORMAL LOW (ref 39.0–52.0)
Hemoglobin: 9 g/dL — ABNORMAL LOW (ref 13.0–17.0)
Hemoglobin: 8.8 g/dL — ABNORMAL LOW (ref 13.0–17.0)
MCH: 32 pg (ref 26.0–34.0)
MCH: 32.4 pg (ref 26.0–34.0)
MCHC: 32.4 g/dL (ref 30.0–36.0)
MCHC: 32.8 g/dL (ref 30.0–36.0)
MCV: 98.9 fL (ref 80.0–100.0)
MCV: 98.5 fL (ref 80.0–100.0)
Platelets: 86 10*3/uL — ABNORMAL LOW (ref 150–400)
Platelets: 74 10*3/uL — ABNORMAL LOW (ref 150–400)
RBC: 2.81 MIL/uL — ABNORMAL LOW (ref 4.22–5.81)
RBC: 2.72 MIL/uL — ABNORMAL LOW (ref 4.22–5.81)
RDW: 12.9 % (ref 11.5–15.5)
RDW: 12.6 % (ref 11.5–15.5)
WBC: 7.5 10*3/uL (ref 4.0–10.5)
WBC: 6.8 10*3/uL (ref 4.0–10.5)
nRBC: 0 % (ref 0.0–0.2)
nRBC: 0 % (ref 0.0–0.2)

## 2019-01-24 LAB — BASIC METABOLIC PANEL
Anion gap: 10 (ref 5–15)
BUN: 7 mg/dL (ref 6–20)
CO2: 29 mmol/L (ref 22–32)
Calcium: 8.2 mg/dL — ABNORMAL LOW (ref 8.9–10.3)
Chloride: 104 mmol/L (ref 98–111)
Creatinine, Ser: 0.81 mg/dL (ref 0.61–1.24)
GFR calc Af Amer: >60 mL/min (ref >60)
GFR calc non Af Amer: >60 mL/min (ref >60)
Glucose, Bld: 92 mg/dL (ref 70–99)
Potassium: 3.9 mmol/L (ref 3.5–5.1)
Sodium: 143 mmol/L (ref 135–145)

## 2019-01-24 MED ORDER — DEXMEDETOMIDINE HCL IN NACL 200 MCG/50ML IV SOLN
0.4000 ug/kg/h | INTRAVENOUS | Status: DC
  Administered 2019-01-24: 0.7 ug/kg/h via INTRAVENOUS
  Administered 2019-01-24: 0.8 ug/kg/h via INTRAVENOUS
  Administered 2019-01-24: 0.4 ug/kg/h via INTRAVENOUS
  Administered 2019-01-25: 1.2 ug/kg/h via INTRAVENOUS
  Administered 2019-01-25: 0.9 ug/kg/h via INTRAVENOUS
  Administered 2019-01-25: 0.8 ug/kg/h via INTRAVENOUS
  Filled 2019-01-24: qty 50
  Filled 2019-01-24: qty 100
  Filled 2019-01-24: qty 50
  Filled 2019-01-24: qty 150

## 2019-01-24 NOTE — Progress Notes (Signed)
Patient ID: Jose [name], male   DOB: 1961/01/15, 58 y.o.   MRN: [id number] Patient is awake vocalizes oriented x2 moves all extremities well  Repeat head CT today

## 2019-01-24 NOTE — Progress Notes (Signed)
Precedex started.
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
## 2019-01-25 ENCOUNTER — Inpatient Hospital Stay (HOSPITAL_COMMUNITY): Payer: Medicaid Other

## 2019-01-25 LAB — CBC WITH DIFFERENTIAL/PLATELET
Abs Immature Granulocytes: 0.02 10*3/uL (ref 0.00–0.07)
Basophils Absolute: 0 10*3/uL (ref 0.0–0.1)
Basophils Relative: 0 %
Eosinophils Absolute: 0 10*3/uL (ref 0.0–0.5)
Eosinophils Relative: 0 %
HCT: 27.4 % — ABNORMAL LOW (ref 39.0–52.0)
Hemoglobin: 9.1 g/dL — ABNORMAL LOW (ref 13.0–17.0)
Immature Granulocytes: 0 %
Lymphocytes Relative: 18 %
Lymphs Abs: 1.2 10*3/uL (ref 0.7–4.0)
MCH: 32.6 pg (ref 26.0–34.0)
MCHC: 33.2 g/dL (ref 30.0–36.0)
MCV: 98.2 fL (ref 80.0–100.0)
Monocytes Absolute: 0.5 10*3/uL (ref 0.1–1.0)
Monocytes Relative: 7 %
Neutro Abs: 5 10*3/uL (ref 1.7–7.7)
Neutrophils Relative %: 75 %
Platelets: 66 10*3/uL — ABNORMAL LOW (ref 150–400)
RBC: 2.79 MIL/uL — ABNORMAL LOW (ref 4.22–5.81)
RDW: 11.8 % (ref 11.5–15.5)
WBC: 6.8 10*3/uL (ref 4.0–10.5)
nRBC: 0 % (ref 0.0–0.2)

## 2019-01-25 LAB — BASIC METABOLIC PANEL
Anion gap: 12 (ref 5–15)
BUN: 6 mg/dL (ref 6–20)
CO2: 26 mmol/L (ref 22–32)
Calcium: 8.7 mg/dL — ABNORMAL LOW (ref 8.9–10.3)
Chloride: 104 mmol/L (ref 98–111)
Creatinine, Ser: 0.79 mg/dL (ref 0.61–1.24)
GFR calc Af Amer: >60 mL/min (ref >60)
GFR calc non Af Amer: >60 mL/min (ref >60)
Glucose, Bld: 104 mg/dL — ABNORMAL HIGH (ref 70–99)
Potassium: 3.2 mmol/L — ABNORMAL LOW (ref 3.5–5.1)
Sodium: 142 mmol/L (ref 135–145)

## 2019-01-25 MED ORDER — LORAZEPAM 2 MG/ML IJ SOLN
0.0000 mg | Freq: Four times a day (QID) | INTRAMUSCULAR | Status: DC
  Administered 2019-01-26 (×2): 2 mg via INTRAVENOUS
  Filled 2019-01-25 (×2): qty 1

## 2019-01-25 MED ORDER — DEXMEDETOMIDINE HCL IN NACL 400 MCG/100ML IV SOLN
0.4000 ug/kg/h | INTRAVENOUS | Status: DC
  Administered 2019-01-25: 2 ug/kg/h via INTRAVENOUS
  Administered 2019-01-25 (×2): 1.2 ug/kg/h via INTRAVENOUS
  Administered 2019-01-26: 10:00:00 0.7 ug/kg/h via INTRAVENOUS
  Administered 2019-01-26: 22:00:00 0.5 ug/kg/h via INTRAVENOUS
  Administered 2019-01-26: 03:00:00 1.5 ug/kg/h via INTRAVENOUS
  Administered 2019-01-27: 05:00:00 0.5 ug/kg/h via INTRAVENOUS
  Filled 2019-01-25: qty 100
  Filled 2019-01-25: qty 200
  Filled 2019-01-25: qty 100
  Filled 2019-01-25: qty 200
  Filled 2019-01-25 (×3): qty 100
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
## 2019-01-26 ENCOUNTER — Inpatient Hospital Stay (HOSPITAL_COMMUNITY): Payer: Medicaid Other

## 2019-01-26 LAB — URINALYSIS, ROUTINE W REFLEX MICROSCOPIC
Bilirubin Urine: NEGATIVE
Glucose, UA: NEGATIVE mg/dL
Hgb urine dipstick: NEGATIVE
Ketones, ur: 20 mg/dL — AB
Leukocytes,Ua: NEGATIVE
Nitrite: NEGATIVE
Protein, ur: NEGATIVE mg/dL
Specific Gravity, Urine: 1.018 (ref 1.005–1.030)
pH: 5 (ref 5.0–8.0)

## 2019-01-26 LAB — CBC WITH DIFFERENTIAL/PLATELET
Abs Immature Granulocytes: 0.03 10*3/uL (ref 0.00–0.07)
Basophils Absolute: 0 10*3/uL (ref 0.0–0.1)
Basophils Relative: 0 %
Eosinophils Absolute: 0 10*3/uL (ref 0.0–0.5)
Eosinophils Relative: 0 %
HCT: 27.4 % — ABNORMAL LOW (ref 39.0–52.0)
Hemoglobin: 9.4 g/dL — ABNORMAL LOW (ref 13.0–17.0)
Immature Granulocytes: 0 %
Lymphocytes Relative: 15 %
Lymphs Abs: 1.2 10*3/uL (ref 0.7–4.0)
MCH: 32.9 pg (ref 26.0–34.0)
MCHC: 34.3 g/dL (ref 30.0–36.0)
MCV: 95.8 fL (ref 80.0–100.0)
Monocytes Absolute: 0.6 10*3/uL (ref 0.1–1.0)
Monocytes Relative: 8 %
Neutro Abs: 6.2 10*3/uL (ref 1.7–7.7)
Neutrophils Relative %: 77 %
Platelets: 91 10*3/uL — ABNORMAL LOW (ref 150–400)
RBC: 2.86 MIL/uL — ABNORMAL LOW (ref 4.22–5.81)
RDW: 11.6 % (ref 11.5–15.5)
WBC: 8.1 10*3/uL (ref 4.0–10.5)
nRBC: 0 % (ref 0.0–0.2)

## 2019-01-26 LAB — BASIC METABOLIC PANEL
Anion gap: 9 (ref 5–15)
BUN: 9 mg/dL (ref 6–20)
CO2: 26 mmol/L (ref 22–32)
Calcium: 8.4 mg/dL — ABNORMAL LOW (ref 8.9–10.3)
Chloride: 106 mmol/L (ref 98–111)
Creatinine, Ser: 0.75 mg/dL (ref 0.61–1.24)
GFR calc Af Amer: >60 mL/min (ref >60)
GFR calc non Af Amer: >60 mL/min (ref >60)
Glucose, Bld: 120 mg/dL — ABNORMAL HIGH (ref 70–99)
Potassium: 3.6 mmol/L (ref 3.5–5.1)
Sodium: 141 mmol/L (ref 135–145)

## 2019-01-26 LAB — GLUCOSE, CAPILLARY
Glucose-Capillary: 111 mg/dL — ABNORMAL HIGH (ref 70–99)
Glucose-Capillary: 98 mg/dL (ref 70–99)
Glucose-Capillary: 85 mg/dL (ref 70–99)
Glucose-Capillary: 117 mg/dL — ABNORMAL HIGH (ref 70–99)

## 2019-01-26 LAB — PHOSPHORUS
Phosphorus: 3.3 mg/dL (ref 2.5–4.6)
Phosphorus: 2.8 mg/dL (ref 2.5–4.6)

## 2019-01-26 LAB — SARS CORONAVIRUS 2 BY RT PCR (HOSPITAL ORDER, PERFORMED IN ~~LOC~~ HOSPITAL LAB): SARS Coronavirus 2: NEGATIVE

## 2019-01-26 LAB — MAGNESIUM
Magnesium: 1.9 mg/dL (ref 1.7–2.4)
Magnesium: 1.9 mg/dL (ref 1.7–2.4)

## 2019-01-26 MED ORDER — METOPROLOL TARTRATE 5 MG/5ML IV SOLN
INTRAVENOUS | Status: AC
  Filled 2019-01-26: qty 5

## 2019-01-26 MED ORDER — LORAZEPAM 1 MG PO TABS
2.0000 mg | ORAL_TABLET | ORAL | Status: DC
  Administered 2019-01-26: 10:00:00 2 mg
  Filled 2019-01-26: qty 2

## 2019-01-26 MED ORDER — PIVOT 1.5 CAL PO LIQD
1000.0000 mL | ORAL | Status: DC
  Administered 2019-01-26: 1000 mL

## 2019-01-26 MED ORDER — LORAZEPAM 2 MG/ML IJ SOLN
1.0000 mg | INTRAMUSCULAR | Status: DC | PRN
  Administered 2019-01-26 – 2019-01-27 (×9): 2 mg via INTRAVENOUS
  Administered 2019-01-28: 1 mg via INTRAVENOUS
  Administered 2019-01-28: 23:00:00 2 mg via INTRAVENOUS
  Administered 2019-02-01: 1 mg via INTRAVENOUS
  Administered 2019-02-01 – 2019-02-02 (×4): 2 mg via INTRAVENOUS
  Filled 2019-01-26 (×19): qty 1

## 2019-01-26 MED ORDER — METOPROLOL TARTRATE 5 MG/5ML IV SOLN
5.0000 mg | INTRAVENOUS | Status: DC | PRN
  Administered 2019-01-26 – 2019-02-02 (×7): 5 mg via INTRAVENOUS
  Filled 2019-01-26 (×7): qty 5

## 2019-01-26 MED ORDER — HYDRALAZINE HCL 20 MG/ML IJ SOLN
10.0000 mg | Freq: Once | INTRAMUSCULAR | Status: AC
  Administered 2019-01-26: 11:00:00 10 mg via INTRAVENOUS

## 2019-01-26 MED ORDER — NALOXONE HCL 0.4 MG/ML IJ SOLN: 0.4000 mg | INTRAMUSCULAR | Status: DC | PRN

## 2019-01-26 MED ORDER — QUETIAPINE FUMARATE 25 MG PO TABS
50.0000 mg | ORAL_TABLET | Freq: Two times a day (BID) | ORAL | Status: DC
  Administered 2019-01-26: 09:00:00 50 mg via NASOGASTRIC
  Filled 2019-01-26 (×2): qty 2

## 2019-01-26 MED ORDER — NALOXONE HCL 0.4 MG/ML IJ SOLN
INTRAMUSCULAR | Status: AC
  Administered 2019-01-26: 13:00:00
  Filled 2019-01-26: qty 1

## 2019-01-26 MED ORDER — VITAL HIGH PROTEIN PO LIQD: 1000.0000 mL | ORAL | Status: DC

## 2019-01-26 MED ORDER — PRO-STAT SUGAR FREE PO LIQD: 30.0000 mL | Freq: Two times a day (BID) | ORAL | Status: DC

## 2019-01-26 MED ORDER — ACETAMINOPHEN 160 MG/5ML PO SOLN
650.0000 mg | Freq: Four times a day (QID) | ORAL | Status: DC | PRN
  Administered 2019-01-26 – 2019-01-27 (×3): 650 mg via ORAL
  Filled 2019-01-26 (×4): qty 20.3

## 2019-01-26 MED ORDER — HYDRALAZINE HCL 20 MG/ML IJ SOLN
INTRAMUSCULAR | Status: AC
  Administered 2019-01-26: 11:00:00
  Filled 2019-01-26: qty 1

## 2019-01-26 MED ORDER — OXYCODONE HCL 5 MG/5ML PO SOLN: 5.0000 mg | ORAL | Status: DC | PRN

## 2019-01-26 MED ORDER — HYDRALAZINE HCL 20 MG/ML IJ SOLN
10.0000 mg | INTRAMUSCULAR | Status: DC | PRN
  Administered 2019-01-27 – 2019-02-20 (×17): 10 mg via INTRAVENOUS
  Filled 2019-01-26 (×18): qty 1
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
## 2019-01-26 NOTE — Progress Notes (Signed)
Patient did have a negative COVID questionnaire on admission but now is having fevers >102 with respiratory worsening. We will test as well as perform basic fever work up.

## 2019-01-26 NOTE — Progress Notes (Signed)
RT came to patient's room due to desat to mid 60's on [place] 2 L.  Patient placed on NRB 15 L, 100%. Sats improved to 98%. RN and MD at bedside.
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
## 2019-01-26 NOTE — Progress Notes (Signed)
Precedex turned off d/t brief periods of bradycardia to the 40s and pauses. Currently NSR with occasional pauses. Closely monitoring for agitation and withdrawal symptoms.

## 2019-01-26 NOTE — Progress Notes (Signed)
Spoke with Enzo Montgomery, wife of Zerick Bencivenga to update them on Jose [name]'s changes and events today including repeat CT of head for more somnolence, fever requiring cultures/testing, progression of alcohol withdrawal treatment. She showed good understanding and all questions were answered.

## 2019-01-26 NOTE — Progress Notes (Signed)
Large amount of thick sputum and dried blood suctioned from back of pt's throat. Pt unable to fully participate in pulmonary hygiene.

## 2019-01-26 NOTE — Progress Notes (Signed)
Patient temp 102.6. This RN noticed NG likely displaced. Notified MD. See orders.

## 2019-01-26 NOTE — Progress Notes (Signed)
Patient desaturated after receiving dilaudid, gave narcan with some resp improvement, still minimal responsive -stop tube feeds -stop per tube neuro meds/pain meds -repeat CT for mental status change once resp status stable -add PRN HTN IV meds

## 2019-01-27 ENCOUNTER — Inpatient Hospital Stay (HOSPITAL_COMMUNITY): Payer: Medicaid Other

## 2019-01-27 ENCOUNTER — Inpatient Hospital Stay (HOSPITAL_COMMUNITY): Payer: Medicaid Other | Admitting: Certified Registered"

## 2019-01-27 LAB — GLUCOSE, CAPILLARY
Glucose-Capillary: 110 mg/dL — ABNORMAL HIGH (ref 70–99)
Glucose-Capillary: 122 mg/dL — ABNORMAL HIGH (ref 70–99)
Glucose-Capillary: 96 mg/dL (ref 70–99)
Glucose-Capillary: 101 mg/dL — ABNORMAL HIGH (ref 70–99)
Glucose-Capillary: 99 mg/dL (ref 70–99)
Glucose-Capillary: 95 mg/dL (ref 70–99)

## 2019-01-27 LAB — MAGNESIUM
Magnesium: 2 mg/dL (ref 1.7–2.4)
Magnesium: 2 mg/dL (ref 1.7–2.4)

## 2019-01-27 LAB — POCT I-STAT 7, (LYTES, BLD GAS, ICA,H+H)
Acid-Base Excess: 4 mmol/L — ABNORMAL HIGH (ref 0.0–2.0)
Bicarbonate: 27.6 mmol/L (ref 20.0–28.0)
Calcium, Ion: 1.17 mmol/L (ref 1.15–1.40)
HCT: 24 % — ABNORMAL LOW (ref 39.0–52.0)
Hemoglobin: 8.2 g/dL — ABNORMAL LOW (ref 13.0–17.0)
O2 Saturation: 100 %
Patient temperature: 98.6
Potassium: 3.6 mmol/L (ref 3.5–5.1)
Sodium: 146 mmol/L — ABNORMAL HIGH (ref 135–145)
TCO2: 29 mmol/L (ref 22–32)
pCO2 arterial: 38 mmHg (ref 32.0–48.0)
pH, Arterial: 7.469 — ABNORMAL HIGH (ref 7.350–7.450)
pO2, Arterial: 384 mmHg — ABNORMAL HIGH (ref 83.0–108.0)

## 2019-01-27 LAB — PHOSPHORUS
Phosphorus: 1.4 mg/dL — ABNORMAL LOW (ref 2.5–4.6)
Phosphorus: 1.5 mg/dL — ABNORMAL LOW (ref 2.5–4.6)

## 2019-01-27 LAB — TRIGLYCERIDES: Triglycerides: 79 mg/dL (ref <150)

## 2019-01-27 MED ORDER — ORAL CARE MOUTH RINSE
15.0000 mL | OROMUCOSAL | Status: DC
  Administered 2019-01-27 – 2019-03-18 (×443): 15 mL via OROMUCOSAL

## 2019-01-27 MED ORDER — CHLORHEXIDINE GLUCONATE 0.12% ORAL RINSE (MEDLINE KIT)
15.0000 mL | Freq: Two times a day (BID) | OROMUCOSAL | Status: DC
  Administered 2019-01-27 – 2019-03-18 (×98): 15 mL via OROMUCOSAL

## 2019-01-27 MED ORDER — PROPOFOL 1000 MG/100ML IV EMUL
INTRAVENOUS | Status: AC
  Administered 2019-01-27: 10 ug/kg/min via INTRAVENOUS
  Filled 2019-01-27: qty 100

## 2019-01-27 MED ORDER — ADULT MULTIVITAMIN W/MINERALS CH
1.0000 | ORAL_TABLET | Freq: Every day | ORAL | Status: DC
  Administered 2019-01-28 – 2019-03-18 (×48): 1
  Filled 2019-01-27 (×49): qty 1

## 2019-01-27 MED ORDER — PROPOFOL 1000 MG/100ML IV EMUL
5.0000 ug/kg/min | INTRAVENOUS | Status: DC
  Administered 2019-01-27: 21:00:00 20 ug/kg/min via INTRAVENOUS
  Administered 2019-01-27: 10 ug/kg/min via INTRAVENOUS
  Administered 2019-01-28 (×2): 30 ug/kg/min via INTRAVENOUS
  Administered 2019-01-29: 35 ug/kg/min via INTRAVENOUS
  Administered 2019-01-29: 06:00:00 30 ug/kg/min via INTRAVENOUS
  Administered 2019-01-29: 35 ug/kg/min via INTRAVENOUS
  Administered 2019-01-30 (×3): 30 ug/kg/min via INTRAVENOUS
  Administered 2019-01-31: 40 ug/kg/min via INTRAVENOUS
  Administered 2019-01-31: 35 ug/kg/min via INTRAVENOUS
  Administered 2019-01-31: 45 ug/kg/min via INTRAVENOUS
  Administered 2019-01-31 – 2019-02-01 (×3): 40 ug/kg/min via INTRAVENOUS
  Administered 2019-02-01: 30 ug/kg/min via INTRAVENOUS
  Administered 2019-02-01 – 2019-02-02 (×2): 40 ug/kg/min via INTRAVENOUS
  Administered 2019-02-02 (×2): 50 ug/kg/min via INTRAVENOUS
  Administered 2019-02-02 – 2019-02-03 (×3): 40 ug/kg/min via INTRAVENOUS
  Administered 2019-02-03: 20 ug/kg/min via INTRAVENOUS
  Administered 2019-02-03: 45 ug/kg/min via INTRAVENOUS
  Administered 2019-02-04: 20 ug/kg/min via INTRAVENOUS
  Administered 2019-02-04: 40 ug/kg/min via INTRAVENOUS
  Administered 2019-02-05: 35 ug/kg/min via INTRAVENOUS
  Administered 2019-02-05 (×2): 20 ug/kg/min via INTRAVENOUS
  Administered 2019-02-06: 25 ug/kg/min via INTRAVENOUS
  Administered 2019-02-06: 16:00:00 15 ug/kg/min via INTRAVENOUS
  Administered 2019-02-07: 40 ug/kg/min via INTRAVENOUS
  Administered 2019-02-07 – 2019-02-08 (×3): 20 ug/kg/min via INTRAVENOUS
  Administered 2019-02-09: 10 ug/kg/min via INTRAVENOUS
  Filled 2019-01-27 (×41): qty 100

## 2019-01-27 MED ORDER — SODIUM CHLORIDE 0.9 % IV BOLUS
1000.0000 mL | Freq: Once | INTRAVENOUS | Status: AC
  Administered 2019-01-27: 1000 mL via INTRAVENOUS

## 2019-01-27 MED ORDER — ALBUMIN HUMAN 5 % IV SOLN
INTRAVENOUS | Status: AC
  Administered 2019-01-27: 13:00:00 25 g via INTRAVENOUS
  Filled 2019-01-27: qty 500

## 2019-01-27 MED ORDER — SODIUM CHLORIDE 0.9 % IV SOLN
3.0000 g | Freq: Four times a day (QID) | INTRAVENOUS | Status: AC
  Administered 2019-01-27 – 2019-02-03 (×28): 3 g via INTRAVENOUS
  Filled 2019-01-27 (×28): qty 3

## 2019-01-27 MED ORDER — ALBUMIN HUMAN 5 % IV SOLN
25.0000 g | Freq: Once | INTRAVENOUS | Status: AC
  Administered 2019-01-27: 25 g via INTRAVENOUS

## 2019-01-27 MED ORDER — SUCCINYLCHOLINE CHLORIDE 20 MG/ML IJ SOLN
INTRAMUSCULAR | Status: DC | PRN
  Administered 2019-01-27: 100 mg via INTRAVENOUS

## 2019-01-27 MED ORDER — PROPOFOL 10 MG/ML IV BOLUS
INTRAVENOUS | Status: DC | PRN
  Administered 2019-01-27: 80 mg via INTRAVENOUS
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
## 2019-01-27 NOTE — Progress Notes (Deleted)
NG tube replaced and tylenol given- temp improved to 99.1. CXray obtained. Patient remains agitated and restless. Following CIWA protocol.

## 2019-01-27 NOTE — Anesthesia Procedure Notes (Signed)
Procedure Name: Intubation Date/Time: 01/27/2019 8:40 AM Performed by: Lanell Matar, CRNA Pre-anesthesia Checklist: Patient identified, Emergency Drugs available, Suction available and Patient being monitored Patient Re-evaluated:Patient Re-evaluated prior to induction Preoxygenation: Pre-oxygenation with 100% oxygen Induction Type: IV induction Ventilation: Two handed mask ventilation required Laryngoscope Size: Glidescope and 3 Grade View: Grade I Tube type: Oral Tube size: 8.0 mm Number of attempts: 1 Airway Equipment and Method: Stylet and Video-laryngoscopy Placement Confirmation: ETT inserted through vocal cords under direct vision,  positive ETCO2,  breath sounds checked- equal and bilateral and CO2 detector Secured at: 24 cm Tube secured with: Tape Dental Injury: Teeth and Oropharynx as per pre-operative assessment  Comments: Called to 4N for intubation. O2 sats 20s-40s. 2 hand mask. Sats increased to 90s while anesthesia bagging. Glidescope. AOI . Pt with very poor dentition. No change in teeth during intubation. Color change on CO2 Easy cap. Bilat breath sounds =B.  VSS.
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
## 2019-01-27 NOTE — Progress Notes (Signed)
Patient transported on ventilator to CT and back to 4N15 with no complications. Vitals remained stable.

## 2019-01-27 NOTE — Progress Notes (Signed)
Pt treated for agitation/withdrawal overnight with CIWA protocol. Tmax 102.6. Tylenol given x2. Pt remains tachypneic and restless.

## 2019-01-27 NOTE — Progress Notes (Signed)
At 2200 assessment, pupils unequal with the right a 2 and the left a 4. Both remain brisk and reactive. NS called. Stat CT ordered.

## 2019-01-27 NOTE — Progress Notes (Signed)
Called to pts BS while on floor.  Pt was desatting and tachy with some arrythmia. Pt with no sedation prior to. Called Anes for intubation Sats at 100% and con't to be sinus tachy CXR pending

## 2019-01-27 NOTE — Progress Notes (Signed)
NG tube replaced and tylenol given. Temp improved to 99.1. CXray obtained. Patient remains agitated and restless. Following CIWA protocol.

## 2019-01-28 ENCOUNTER — Inpatient Hospital Stay (HOSPITAL_COMMUNITY): Payer: Medicaid Other

## 2019-01-28 LAB — CBC
HCT: 20.7 % — ABNORMAL LOW (ref 39.0–52.0)
HCT: 22.1 % — ABNORMAL LOW (ref 39.0–52.0)
Hemoglobin: 6.9 g/dL — CL (ref 13.0–17.0)
Hemoglobin: 7.4 g/dL — ABNORMAL LOW (ref 13.0–17.0)
MCH: 32.4 pg (ref 26.0–34.0)
MCH: 32.6 pg (ref 26.0–34.0)
MCHC: 33.3 g/dL (ref 30.0–36.0)
MCHC: 33.5 g/dL (ref 30.0–36.0)
MCV: 97.2 fL (ref 80.0–100.0)
MCV: 97.4 fL (ref 80.0–100.0)
Platelets: 90 10*3/uL — ABNORMAL LOW (ref 150–400)
Platelets: 101 10*3/uL — ABNORMAL LOW (ref 150–400)
RBC: 2.13 MIL/uL — ABNORMAL LOW (ref 4.22–5.81)
RBC: 2.27 MIL/uL — ABNORMAL LOW (ref 4.22–5.81)
RDW: 12.3 % (ref 11.5–15.5)
RDW: 12.5 % (ref 11.5–15.5)
WBC: 4.4 10*3/uL (ref 4.0–10.5)
WBC: 5.3 10*3/uL (ref 4.0–10.5)
nRBC: 0 % (ref 0.0–0.2)
nRBC: 0 % (ref 0.0–0.2)

## 2019-01-28 LAB — BASIC METABOLIC PANEL
Anion gap: 13 (ref 5–15)
BUN: 27 mg/dL — ABNORMAL HIGH (ref 6–20)
CO2: 22 mmol/L (ref 22–32)
Calcium: 8.4 mg/dL — ABNORMAL LOW (ref 8.9–10.3)
Chloride: 114 mmol/L — ABNORMAL HIGH (ref 98–111)
Creatinine, Ser: 1.89 mg/dL — ABNORMAL HIGH (ref 0.61–1.24)
GFR calc Af Amer: 45 mL/min — ABNORMAL LOW (ref >60)
GFR calc non Af Amer: 39 mL/min — ABNORMAL LOW (ref >60)
Glucose, Bld: 100 mg/dL — ABNORMAL HIGH (ref 70–99)
Potassium: 3.2 mmol/L — ABNORMAL LOW (ref 3.5–5.1)
Sodium: 149 mmol/L — ABNORMAL HIGH (ref 135–145)

## 2019-01-28 LAB — GLUCOSE, CAPILLARY
Glucose-Capillary: 100 mg/dL — ABNORMAL HIGH (ref 70–99)
Glucose-Capillary: 101 mg/dL — ABNORMAL HIGH (ref 70–99)
Glucose-Capillary: 102 mg/dL — ABNORMAL HIGH (ref 70–99)
Glucose-Capillary: 112 mg/dL — ABNORMAL HIGH (ref 70–99)
Glucose-Capillary: 117 mg/dL — ABNORMAL HIGH (ref 70–99)
Glucose-Capillary: 147 mg/dL — ABNORMAL HIGH (ref 70–99)

## 2019-01-28 MED ORDER — ACETAMINOPHEN 160 MG/5ML PO SOLN
650.0000 mg | Freq: Four times a day (QID) | ORAL | Status: DC | PRN
  Administered 2019-01-28 – 2019-03-18 (×21): 650 mg
  Filled 2019-01-28 (×19): qty 20.3

## 2019-01-28 MED ORDER — POTASSIUM CHLORIDE 10 MEQ/100ML IV SOLN
10.0000 meq | INTRAVENOUS | Status: AC
  Administered 2019-01-28 (×6): 10 meq via INTRAVENOUS
  Filled 2019-01-28 (×6): qty 100

## 2019-01-28 MED ORDER — SODIUM PHOSPHATES 45 MMOLE/15ML IV SOLN
10.0000 mmol | Freq: Once | INTRAVENOUS | Status: AC
  Administered 2019-01-28: 14:00:00 10 mmol via INTRAVENOUS
  Filled 2019-01-28: qty 3.33

## 2019-01-28 MED ORDER — PIVOT 1.5 CAL PO LIQD
1000.0000 mL | ORAL | Status: DC
  Administered 2019-01-28: 1000 mL

## 2019-01-28 MED ORDER — POTASSIUM CHLORIDE IN NACL 20-0.45 MEQ/L-% IV SOLN
INTRAVENOUS | Status: DC
  Administered 2019-01-28 – 2019-03-12 (×38): via INTRAVENOUS
  Filled 2019-01-28 (×51): qty 1000

## 2019-01-28 MED ORDER — FENTANYL CITRATE (PF) 100 MCG/2ML IJ SOLN
25.0000 ug | INTRAMUSCULAR | Status: DC | PRN
  Administered 2019-01-31 – 2019-02-16 (×28): 50 ug via INTRAVENOUS
  Administered 2019-02-17: 18:00:00 25 ug via INTRAVENOUS
  Administered 2019-02-21 – 2019-02-25 (×7): 50 ug via INTRAVENOUS
  Administered 2019-02-27: 25 ug via INTRAVENOUS
  Administered 2019-02-27 – 2019-03-02 (×6): 50 ug via INTRAVENOUS
  Filled 2019-01-28 (×46): qty 2
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
## 2019-01-29 ENCOUNTER — Inpatient Hospital Stay (HOSPITAL_COMMUNITY): Payer: Medicaid Other

## 2019-01-29 LAB — BASIC METABOLIC PANEL
Anion gap: 9 (ref 5–15)
BUN: 29 mg/dL — ABNORMAL HIGH (ref 6–20)
CO2: 20 mmol/L — ABNORMAL LOW (ref 22–32)
Calcium: 8.4 mg/dL — ABNORMAL LOW (ref 8.9–10.3)
Chloride: 118 mmol/L — ABNORMAL HIGH (ref 98–111)
Creatinine, Ser: 1.66 mg/dL — ABNORMAL HIGH (ref 0.61–1.24)
GFR calc Af Amer: 53 mL/min — ABNORMAL LOW (ref >60)
GFR calc non Af Amer: 45 mL/min — ABNORMAL LOW (ref >60)
Glucose, Bld: 147 mg/dL — ABNORMAL HIGH (ref 70–99)
Potassium: 3.5 mmol/L (ref 3.5–5.1)
Sodium: 147 mmol/L — ABNORMAL HIGH (ref 135–145)

## 2019-01-29 LAB — PHOSPHORUS
Phosphorus: 1.7 mg/dL — ABNORMAL LOW (ref 2.5–4.6)
Phosphorus: 1.9 mg/dL — ABNORMAL LOW (ref 2.5–4.6)
Phosphorus: 2.3 mg/dL — ABNORMAL LOW (ref 2.5–4.6)

## 2019-01-29 LAB — CBC
HCT: 22 % — ABNORMAL LOW (ref 39.0–52.0)
Hemoglobin: 7.4 g/dL — ABNORMAL LOW (ref 13.0–17.0)
MCH: 32.7 pg (ref 26.0–34.0)
MCHC: 33.6 g/dL (ref 30.0–36.0)
MCV: 97.3 fL (ref 80.0–100.0)
Platelets: 99 10*3/uL — ABNORMAL LOW (ref 150–400)
RBC: 2.26 MIL/uL — ABNORMAL LOW (ref 4.22–5.81)
RDW: 12.8 % (ref 11.5–15.5)
WBC: 4.3 10*3/uL (ref 4.0–10.5)
nRBC: 0 % (ref 0.0–0.2)

## 2019-01-29 LAB — MAGNESIUM
Magnesium: 2.2 mg/dL (ref 1.7–2.4)
Magnesium: 2.2 mg/dL (ref 1.7–2.4)

## 2019-01-29 LAB — GLUCOSE, CAPILLARY
Glucose-Capillary: 121 mg/dL — ABNORMAL HIGH (ref 70–99)
Glucose-Capillary: 143 mg/dL — ABNORMAL HIGH (ref 70–99)
Glucose-Capillary: 133 mg/dL — ABNORMAL HIGH (ref 70–99)
Glucose-Capillary: 124 mg/dL — ABNORMAL HIGH (ref 70–99)
Glucose-Capillary: 142 mg/dL — ABNORMAL HIGH (ref 70–99)
Glucose-Capillary: 141 mg/dL — ABNORMAL HIGH (ref 70–99)

## 2019-01-29 MED ORDER — PRO-STAT SUGAR FREE PO LIQD
30.0000 mL | Freq: Two times a day (BID) | ORAL | Status: DC
  Administered 2019-01-29: 09:00:00 30 mL
  Filled 2019-01-29: qty 30

## 2019-01-29 MED ORDER — PIVOT 1.5 CAL PO LIQD: 1000.0000 mL | ORAL | Status: DC

## 2019-01-29 MED ORDER — VITAMIN C 500 MG PO TABS
1000.0000 mg | ORAL_TABLET | Freq: Three times a day (TID) | ORAL | Status: AC
  Administered 2019-01-29 – 2019-02-04 (×21): 1000 mg
  Filled 2019-01-29 (×21): qty 2

## 2019-01-29 MED ORDER — POTASSIUM CHLORIDE 10 MEQ/100ML IV SOLN
10.0000 meq | INTRAVENOUS | Status: AC
  Administered 2019-01-29 (×3): 10 meq via INTRAVENOUS
  Filled 2019-01-29 (×3): qty 100

## 2019-01-29 MED ORDER — SELENIUM 200 MCG PO TABS
200.0000 ug | ORAL_TABLET | Freq: Every day | ORAL | Status: AC
  Administered 2019-01-29 – 2019-02-04 (×7): 200 ug
  Filled 2019-01-29 (×7): qty 1

## 2019-01-29 MED ORDER — VITAMIN E 100 UNT/0.25ML PO OIL
400.0000 [IU] | TOPICAL_OIL | Freq: Three times a day (TID) | ORAL | Status: AC
  Administered 2019-01-29 – 2019-02-04 (×19): 400 [IU]
  Filled 2019-01-29 (×22): qty 1

## 2019-01-29 MED ORDER — VITAL HIGH PROTEIN PO LIQD
1000.0000 mL | ORAL | Status: DC
  Administered 2019-01-29: 09:00:00 1000 mL

## 2019-01-29 MED ORDER — PIVOT 1.5 CAL PO LIQD
1000.0000 mL | ORAL | Status: DC
  Administered 2019-01-29 – 2019-02-16 (×11): 1000 mL
  Filled 2019-01-29 (×2): qty 1000

## 2019-01-29 MED ORDER — PRO-STAT SUGAR FREE PO LIQD
30.0000 mL | Freq: Two times a day (BID) | ORAL | Status: DC
  Administered 2019-01-29 – 2019-02-16 (×36): 30 mL
  Filled 2019-01-29 (×36): qty 30
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
## 2019-01-30 ENCOUNTER — Inpatient Hospital Stay (HOSPITAL_COMMUNITY): Payer: Medicaid Other

## 2019-01-30 LAB — BASIC METABOLIC PANEL
Anion gap: 11 (ref 5–15)
BUN: 41 mg/dL — ABNORMAL HIGH (ref 6–20)
CO2: 20 mmol/L — ABNORMAL LOW (ref 22–32)
Calcium: 8.6 mg/dL — ABNORMAL LOW (ref 8.9–10.3)
Chloride: 117 mmol/L — ABNORMAL HIGH (ref 98–111)
Creatinine, Ser: 1.84 mg/dL — ABNORMAL HIGH (ref 0.61–1.24)
GFR calc Af Amer: 46 mL/min — ABNORMAL LOW (ref >60)
GFR calc non Af Amer: 40 mL/min — ABNORMAL LOW (ref >60)
Glucose, Bld: 143 mg/dL — ABNORMAL HIGH (ref 70–99)
Potassium: 4 mmol/L (ref 3.5–5.1)
Sodium: 148 mmol/L — ABNORMAL HIGH (ref 135–145)

## 2019-01-30 LAB — PHOSPHORUS
Phosphorus: 2.9 mg/dL (ref 2.5–4.6)
Phosphorus: 3.4 mg/dL (ref 2.5–4.6)

## 2019-01-30 LAB — TRIGLYCERIDES: Triglycerides: 92 mg/dL (ref <150)

## 2019-01-30 LAB — CBC
HCT: 21.5 % — ABNORMAL LOW (ref 39.0–52.0)
Hemoglobin: 7 g/dL — ABNORMAL LOW (ref 13.0–17.0)
MCH: 32.1 pg (ref 26.0–34.0)
MCHC: 32.6 g/dL (ref 30.0–36.0)
MCV: 98.6 fL (ref 80.0–100.0)
Platelets: 114 10*3/uL — ABNORMAL LOW (ref 150–400)
RBC: 2.18 MIL/uL — ABNORMAL LOW (ref 4.22–5.81)
RDW: 13 % (ref 11.5–15.5)
WBC: 4.7 10*3/uL (ref 4.0–10.5)
nRBC: 0 % (ref 0.0–0.2)

## 2019-01-30 LAB — GLUCOSE, CAPILLARY
Glucose-Capillary: 159 mg/dL — ABNORMAL HIGH (ref 70–99)
Glucose-Capillary: 150 mg/dL — ABNORMAL HIGH (ref 70–99)
Glucose-Capillary: 143 mg/dL — ABNORMAL HIGH (ref 70–99)
Glucose-Capillary: 138 mg/dL — ABNORMAL HIGH (ref 70–99)
Glucose-Capillary: 143 mg/dL — ABNORMAL HIGH (ref 70–99)
Glucose-Capillary: 136 mg/dL — ABNORMAL HIGH (ref 70–99)

## 2019-01-30 LAB — MAGNESIUM
Magnesium: 1.9 mg/dL (ref 1.7–2.4)
Magnesium: 2.2 mg/dL (ref 1.7–2.4)
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
## 2019-01-31 ENCOUNTER — Inpatient Hospital Stay (HOSPITAL_COMMUNITY): Payer: Medicaid Other

## 2019-01-31 LAB — CBC
HCT: 23.3 % — ABNORMAL LOW (ref 39.0–52.0)
Hemoglobin: 7.4 g/dL — ABNORMAL LOW (ref 13.0–17.0)
MCH: 31.9 pg (ref 26.0–34.0)
MCHC: 31.8 g/dL (ref 30.0–36.0)
MCV: 100.4 fL — ABNORMAL HIGH (ref 80.0–100.0)
Platelets: 125 10*3/uL — ABNORMAL LOW (ref 150–400)
RBC: 2.32 MIL/uL — ABNORMAL LOW (ref 4.22–5.81)
RDW: 13.2 % (ref 11.5–15.5)
WBC: 6.4 10*3/uL (ref 4.0–10.5)
nRBC: 0 % (ref 0.0–0.2)

## 2019-01-31 LAB — BASIC METABOLIC PANEL
Anion gap: 12 (ref 5–15)
BUN: 47 mg/dL — ABNORMAL HIGH (ref 6–20)
CO2: 19 mmol/L — ABNORMAL LOW (ref 22–32)
Calcium: 8.6 mg/dL — ABNORMAL LOW (ref 8.9–10.3)
Chloride: 117 mmol/L — ABNORMAL HIGH (ref 98–111)
Creatinine, Ser: 1.81 mg/dL — ABNORMAL HIGH (ref 0.61–1.24)
GFR calc Af Amer: 47 mL/min — ABNORMAL LOW (ref >60)
GFR calc non Af Amer: 41 mL/min — ABNORMAL LOW (ref >60)
Glucose, Bld: 150 mg/dL — ABNORMAL HIGH (ref 70–99)
Potassium: 3.9 mmol/L (ref 3.5–5.1)
Sodium: 148 mmol/L — ABNORMAL HIGH (ref 135–145)

## 2019-01-31 LAB — GLUCOSE, CAPILLARY
Glucose-Capillary: 135 mg/dL — ABNORMAL HIGH (ref 70–99)
Glucose-Capillary: 137 mg/dL — ABNORMAL HIGH (ref 70–99)
Glucose-Capillary: 126 mg/dL — ABNORMAL HIGH (ref 70–99)
Glucose-Capillary: 106 mg/dL — ABNORMAL HIGH (ref 70–99)
Glucose-Capillary: 145 mg/dL — ABNORMAL HIGH (ref 70–99)
Glucose-Capillary: 136 mg/dL — ABNORMAL HIGH (ref 70–99)
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
## 2019-02-01 LAB — CBC
HCT: 25.3 % — ABNORMAL LOW (ref 39.0–52.0)
Hemoglobin: 8.1 g/dL — ABNORMAL LOW (ref 13.0–17.0)
MCH: 31.9 pg (ref 26.0–34.0)
MCHC: 32 g/dL (ref 30.0–36.0)
MCV: 99.6 fL (ref 80.0–100.0)
Platelets: 160 10*3/uL (ref 150–400)
RBC: 2.54 MIL/uL — ABNORMAL LOW (ref 4.22–5.81)
RDW: 13.3 % (ref 11.5–15.5)
WBC: 8.5 10*3/uL (ref 4.0–10.5)
nRBC: 0 % (ref 0.0–0.2)

## 2019-02-01 LAB — BASIC METABOLIC PANEL
Anion gap: 11 (ref 5–15)
BUN: 51 mg/dL — ABNORMAL HIGH (ref 6–20)
CO2: 18 mmol/L — ABNORMAL LOW (ref 22–32)
Calcium: 8.4 mg/dL — ABNORMAL LOW (ref 8.9–10.3)
Chloride: 118 mmol/L — ABNORMAL HIGH (ref 98–111)
Creatinine, Ser: 1.78 mg/dL — ABNORMAL HIGH (ref 0.61–1.24)
GFR calc Af Amer: 48 mL/min — ABNORMAL LOW (ref >60)
GFR calc non Af Amer: 42 mL/min — ABNORMAL LOW (ref >60)
Glucose, Bld: 136 mg/dL — ABNORMAL HIGH (ref 70–99)
Potassium: 4.1 mmol/L (ref 3.5–5.1)
Sodium: 147 mmol/L — ABNORMAL HIGH (ref 135–145)

## 2019-02-01 LAB — CULTURE, BLOOD (ROUTINE X 2)
Culture: NO GROWTH
Culture: NO GROWTH
Special Requests: ADEQUATE
Special Requests: ADEQUATE

## 2019-02-01 LAB — GLUCOSE, CAPILLARY
Glucose-Capillary: 132 mg/dL — ABNORMAL HIGH (ref 70–99)
Glucose-Capillary: 142 mg/dL — ABNORMAL HIGH (ref 70–99)
Glucose-Capillary: 134 mg/dL — ABNORMAL HIGH (ref 70–99)
Glucose-Capillary: 135 mg/dL — ABNORMAL HIGH (ref 70–99)
Glucose-Capillary: 141 mg/dL — ABNORMAL HIGH (ref 70–99)

## 2019-02-01 NOTE — Progress Notes (Signed)
No changes in neuro status overnight. MAE spontaneously. Per CIWA order, ativan give 2x for agitation. Pt remains hypertensive requiring multiple PRNs to obtain SBP <180.
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
## 2019-02-01 NOTE — Progress Notes (Signed)
Patient tolerated wean trial for 8 hours today.

## 2019-02-01 NOTE — Plan of Care (Signed)
Progressing on all goals at this time.  

## 2019-02-02 ENCOUNTER — Inpatient Hospital Stay (HOSPITAL_COMMUNITY): Payer: Medicaid Other

## 2019-02-02 LAB — GLUCOSE, CAPILLARY
Glucose-Capillary: 122 mg/dL — ABNORMAL HIGH (ref 70–99)
Glucose-Capillary: 129 mg/dL — ABNORMAL HIGH (ref 70–99)
Glucose-Capillary: 135 mg/dL — ABNORMAL HIGH (ref 70–99)
Glucose-Capillary: 136 mg/dL — ABNORMAL HIGH (ref 70–99)
Glucose-Capillary: 137 mg/dL — ABNORMAL HIGH (ref 70–99)
Glucose-Capillary: 148 mg/dL — ABNORMAL HIGH (ref 70–99)
Glucose-Capillary: 150 mg/dL — ABNORMAL HIGH (ref 70–99)

## 2019-02-02 LAB — TRIGLYCERIDES: Triglycerides: 79 mg/dL (ref <150)

## 2019-02-02 MED ORDER — METOPROLOL TARTRATE 5 MG/5ML IV SOLN
10.0000 mg | INTRAVENOUS | Status: DC | PRN
  Administered 2019-02-10 – 2019-02-28 (×4): 10 mg via INTRAVENOUS
  Filled 2019-02-02 (×5): qty 10

## 2019-02-02 MED ORDER — LORAZEPAM 2 MG/ML IJ SOLN
1.0000 mg | INTRAMUSCULAR | Status: DC | PRN
  Administered 2019-02-02 – 2019-02-11 (×6): 1 mg via INTRAVENOUS
  Filled 2019-02-02 (×6): qty 1
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
## 2019-02-02 NOTE — Plan of Care (Signed)
Progressing on all goals at this time.  

## 2019-02-02 NOTE — Progress Notes (Signed)
Spoke to McClure, patient's brother, and updated him on patient's status today.  Letha Cape 10:06 AM 02/02/2019

## 2019-02-03 LAB — GLUCOSE, CAPILLARY
Glucose-Capillary: 116 mg/dL — ABNORMAL HIGH (ref 70–99)
Glucose-Capillary: 105 mg/dL — ABNORMAL HIGH (ref 70–99)
Glucose-Capillary: 114 mg/dL — ABNORMAL HIGH (ref 70–99)
Glucose-Capillary: 125 mg/dL — ABNORMAL HIGH (ref 70–99)
Glucose-Capillary: 119 mg/dL — ABNORMAL HIGH (ref 70–99)
Glucose-Capillary: 112 mg/dL — ABNORMAL HIGH (ref 70–99)

## 2019-02-03 LAB — BASIC METABOLIC PANEL
Anion gap: 9 (ref 5–15)
BUN: 56 mg/dL — ABNORMAL HIGH (ref 6–20)
CO2: 19 mmol/L — ABNORMAL LOW (ref 22–32)
Calcium: 8.5 mg/dL — ABNORMAL LOW (ref 8.9–10.3)
Chloride: 121 mmol/L — ABNORMAL HIGH (ref 98–111)
Creatinine, Ser: 1.75 mg/dL — ABNORMAL HIGH (ref 0.61–1.24)
GFR calc Af Amer: 49 mL/min — ABNORMAL LOW (ref >60)
GFR calc non Af Amer: 43 mL/min — ABNORMAL LOW (ref >60)
Glucose, Bld: 131 mg/dL — ABNORMAL HIGH (ref 70–99)
Potassium: 4.2 mmol/L (ref 3.5–5.1)
Sodium: 149 mmol/L — ABNORMAL HIGH (ref 135–145)

## 2019-02-03 LAB — CBC
HCT: 23.3 % — ABNORMAL LOW (ref 39.0–52.0)
Hemoglobin: 7.3 g/dL — ABNORMAL LOW (ref 13.0–17.0)
MCH: 31.5 pg (ref 26.0–34.0)
MCHC: 31.3 g/dL (ref 30.0–36.0)
MCV: 100.4 fL — ABNORMAL HIGH (ref 80.0–100.0)
Platelets: 192 10*3/uL (ref 150–400)
RBC: 2.32 MIL/uL — ABNORMAL LOW (ref 4.22–5.81)
RDW: 14.1 % (ref 11.5–15.5)
WBC: 6.3 10*3/uL (ref 4.0–10.5)
nRBC: 0 % (ref 0.0–0.2)

## 2019-02-03 MED ORDER — QUETIAPINE FUMARATE 25 MG PO TABS
50.0000 mg | ORAL_TABLET | Freq: Two times a day (BID) | ORAL | Status: DC
  Administered 2019-02-03 – 2019-02-08 (×12): 50 mg
  Filled 2019-02-03 (×12): qty 2

## 2019-02-03 MED ORDER — FREE WATER
200.0000 mL | Freq: Three times a day (TID) | Status: DC
  Administered 2019-02-03 – 2019-02-05 (×7): 200 mL

## 2019-02-03 MED ORDER — CLONAZEPAM 0.5 MG PO TABS
0.5000 mg | ORAL_TABLET | Freq: Two times a day (BID) | ORAL | Status: DC
  Administered 2019-02-03 – 2019-02-08 (×12): 0.5 mg
  Filled 2019-02-03 (×12): qty 1
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
## 2019-02-03 NOTE — Progress Notes (Signed)
No acute changes overnight. PRNs utilized to control agitation and restlessness.
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
## 2019-02-04 ENCOUNTER — Inpatient Hospital Stay (HOSPITAL_COMMUNITY): Payer: Medicaid Other

## 2019-02-04 LAB — CBC
HCT: 21.6 % — ABNORMAL LOW (ref 39.0–52.0)
Hemoglobin: 6.8 g/dL — CL (ref 13.0–17.0)
MCH: 31.9 pg (ref 26.0–34.0)
MCHC: 31.5 g/dL (ref 30.0–36.0)
MCV: 101.4 fL — ABNORMAL HIGH (ref 80.0–100.0)
Platelets: 201 10*3/uL (ref 150–400)
RBC: 2.13 MIL/uL — ABNORMAL LOW (ref 4.22–5.81)
RDW: 14.2 % (ref 11.5–15.5)
WBC: 6.8 10*3/uL (ref 4.0–10.5)
nRBC: 0 % (ref 0.0–0.2)

## 2019-02-04 LAB — PREPARE RBC (CROSSMATCH)

## 2019-02-04 LAB — GLUCOSE, CAPILLARY
Glucose-Capillary: 114 mg/dL — ABNORMAL HIGH (ref 70–99)
Glucose-Capillary: 121 mg/dL — ABNORMAL HIGH (ref 70–99)
Glucose-Capillary: 125 mg/dL — ABNORMAL HIGH (ref 70–99)
Glucose-Capillary: 131 mg/dL — ABNORMAL HIGH (ref 70–99)
Glucose-Capillary: 133 mg/dL — ABNORMAL HIGH (ref 70–99)
Glucose-Capillary: 142 mg/dL — ABNORMAL HIGH (ref 70–99)

## 2019-02-04 LAB — BASIC METABOLIC PANEL
Anion gap: 7 (ref 5–15)
BUN: 61 mg/dL — ABNORMAL HIGH (ref 6–20)
CO2: 18 mmol/L — ABNORMAL LOW (ref 22–32)
Calcium: 8.4 mg/dL — ABNORMAL LOW (ref 8.9–10.3)
Chloride: 122 mmol/L — ABNORMAL HIGH (ref 98–111)
Creatinine, Ser: 1.58 mg/dL — ABNORMAL HIGH (ref 0.61–1.24)
GFR calc Af Amer: 56 mL/min — ABNORMAL LOW (ref >60)
GFR calc non Af Amer: 48 mL/min — ABNORMAL LOW (ref >60)
Glucose, Bld: 117 mg/dL — ABNORMAL HIGH (ref 70–99)
Potassium: 4.4 mmol/L (ref 3.5–5.1)
Sodium: 147 mmol/L — ABNORMAL HIGH (ref 135–145)

## 2019-02-04 LAB — ABO/RH: ABO/RH(D): A POS

## 2019-02-04 MED ORDER — DEXMEDETOMIDINE HCL IN NACL 200 MCG/50ML IV SOLN
0.4000 ug/kg/h | INTRAVENOUS | Status: DC
  Administered 2019-02-04 (×2): 1.2 ug/kg/h via INTRAVENOUS
  Administered 2019-02-04: 0.4 ug/kg/h via INTRAVENOUS
  Administered 2019-02-04: 1 ug/kg/h via INTRAVENOUS
  Administered 2019-02-04: 1.2 ug/kg/h via INTRAVENOUS
  Administered 2019-02-04 – 2019-02-05 (×3): 1 ug/kg/h via INTRAVENOUS
  Administered 2019-02-05: 06:00:00 0.8 ug/kg/h via INTRAVENOUS
  Administered 2019-02-05: 1.2 ug/kg/h via INTRAVENOUS
  Administered 2019-02-05: 1 ug/kg/h via INTRAVENOUS
  Administered 2019-02-05: 1.2 ug/kg/h via INTRAVENOUS
  Administered 2019-02-05 (×2): 1 ug/kg/h via INTRAVENOUS
  Administered 2019-02-05: 0.8 ug/kg/h via INTRAVENOUS
  Administered 2019-02-06 (×5): 1 ug/kg/h via INTRAVENOUS
  Administered 2019-02-06: 0.9 ug/kg/h via INTRAVENOUS
  Administered 2019-02-06: 03:00:00 1.2 ug/kg/h via INTRAVENOUS
  Administered 2019-02-07 (×3): 1.1 ug/kg/h via INTRAVENOUS
  Filled 2019-02-04: qty 100
  Filled 2019-02-04 (×6): qty 50
  Filled 2019-02-04 (×2): qty 100
  Filled 2019-02-04: qty 50
  Filled 2019-02-04: qty 100
  Filled 2019-02-04 (×8): qty 50
  Filled 2019-02-04: qty 100
  Filled 2019-02-04 (×2): qty 50

## 2019-02-04 MED ORDER — DEXTROSE 5 % IV SOLN: 500.0000 mg | Freq: Two times a day (BID) | INTRAVENOUS | Status: DC

## 2019-02-04 MED ORDER — SODIUM CHLORIDE 0.9% IV SOLUTION
Freq: Once | INTRAVENOUS | Status: AC
  Administered 2019-02-04: 06:00:00 via INTRAVENOUS

## 2019-02-04 NOTE — Progress Notes (Signed)
Pt remains agitated MD made aware, Dr. Corliss Skains,. MD advised use precedex first if pt still agitated and needs propofol ok to restart.  Will continue to monitor.
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
## 2019-02-04 NOTE — Progress Notes (Signed)
Pt temp 102.6 at 2000. Dr. Corliss Skains made aware- Tylenol and labs ordered. Will continue to monitor.

## 2019-02-05 LAB — COMPREHENSIVE METABOLIC PANEL
ALT: 55 U/L — ABNORMAL HIGH (ref 0–44)
AST: 46 U/L — ABNORMAL HIGH (ref 15–41)
Albumin: 2.3 g/dL — ABNORMAL LOW (ref 3.5–5.0)
Alkaline Phosphatase: 55 U/L (ref 38–126)
Anion gap: 10 (ref 5–15)
BUN: 62 mg/dL — ABNORMAL HIGH (ref 6–20)
CO2: 18 mmol/L — ABNORMAL LOW (ref 22–32)
Calcium: 8.7 mg/dL — ABNORMAL LOW (ref 8.9–10.3)
Chloride: 120 mmol/L — ABNORMAL HIGH (ref 98–111)
Creatinine, Ser: 1.48 mg/dL — ABNORMAL HIGH (ref 0.61–1.24)
GFR calc Af Amer: >60 mL/min (ref >60)
GFR calc non Af Amer: 52 mL/min — ABNORMAL LOW (ref >60)
Glucose, Bld: 167 mg/dL — ABNORMAL HIGH (ref 70–99)
Potassium: 4.4 mmol/L (ref 3.5–5.1)
Sodium: 148 mmol/L — ABNORMAL HIGH (ref 135–145)
Total Bilirubin: 0.5 mg/dL (ref 0.3–1.2)
Total Protein: 5.9 g/dL — ABNORMAL LOW (ref 6.5–8.1)

## 2019-02-05 LAB — GLUCOSE, CAPILLARY
Glucose-Capillary: 119 mg/dL — ABNORMAL HIGH (ref 70–99)
Glucose-Capillary: 126 mg/dL — ABNORMAL HIGH (ref 70–99)
Glucose-Capillary: 129 mg/dL — ABNORMAL HIGH (ref 70–99)
Glucose-Capillary: 114 mg/dL — ABNORMAL HIGH (ref 70–99)
Glucose-Capillary: 109 mg/dL — ABNORMAL HIGH (ref 70–99)
Glucose-Capillary: 107 mg/dL — ABNORMAL HIGH (ref 70–99)

## 2019-02-05 LAB — CBC
HCT: 27 % — ABNORMAL LOW (ref 39.0–52.0)
Hemoglobin: 8.7 g/dL — ABNORMAL LOW (ref 13.0–17.0)
MCH: 31.9 pg (ref 26.0–34.0)
MCHC: 32.2 g/dL (ref 30.0–36.0)
MCV: 98.9 fL (ref 80.0–100.0)
Platelets: 206 10*3/uL (ref 150–400)
RBC: 2.73 MIL/uL — ABNORMAL LOW (ref 4.22–5.81)
RDW: 15.3 % (ref 11.5–15.5)
WBC: 11.1 10*3/uL — ABNORMAL HIGH (ref 4.0–10.5)
nRBC: 0 % (ref 0.0–0.2)

## 2019-02-05 LAB — BPAM RBC
Blood Product Expiration Date: 202005192359
ISSUE DATE / TIME: 202005131101
Unit Type and Rh: 6200

## 2019-02-05 LAB — TYPE AND SCREEN
ABO/RH(D): A POS
Antibody Screen: NEGATIVE
Unit division: 0

## 2019-02-05 LAB — TRIGLYCERIDES: Triglycerides: 70 mg/dL (ref <150)

## 2019-02-05 MED ORDER — FREE WATER
200.0000 mL | Freq: Four times a day (QID) | Status: DC
  Administered 2019-02-05 – 2019-02-06 (×4): 200 mL

## 2019-02-05 MED ORDER — PIPERACILLIN-TAZOBACTAM 3.375 G IVPB
3.3750 g | Freq: Three times a day (TID) | INTRAVENOUS | Status: DC
  Administered 2019-02-05 – 2019-02-09 (×13): 3.375 g via INTRAVENOUS
  Filled 2019-02-05 (×13): qty 50

## 2019-02-05 MED ORDER — GUAIFENESIN 100 MG/5ML PO SOLN
15.0000 mL | Freq: Four times a day (QID) | ORAL | Status: DC
  Administered 2019-02-05 – 2019-02-17 (×48): 300 mg
  Filled 2019-02-05 (×15): qty 15
  Filled 2019-02-05: qty 30
  Filled 2019-02-05 (×14): qty 15
  Filled 2019-02-05: qty 45
  Filled 2019-02-05 (×6): qty 15
  Filled 2019-02-05: qty 45
  Filled 2019-02-05 (×6): qty 15

## 2019-02-05 MED ORDER — GUAIFENESIN 100 MG/5ML PO SOLN: 15.0000 mL | Freq: Four times a day (QID) | ORAL | Status: DC

## 2019-02-05 MED ORDER — ENOXAPARIN SODIUM 40 MG/0.4ML ~~LOC~~ SOLN
40.0000 mg | SUBCUTANEOUS | Status: DC
  Administered 2019-02-05 – 2019-03-18 (×41): 40 mg via SUBCUTANEOUS
  Filled 2019-02-05 (×41): qty 0.4

## 2019-02-05 NOTE — Plan of Care (Signed)
Progressing on goals at this time.  

## 2019-02-05 NOTE — Progress Notes (Signed)
Pharmacy Antibiotic Note  Jose [name] is a 58 y.o. male now with concern for pneumonia. He is s/p completed Unasyn therapy and is now fevering Tmax 102.6, increased secretions, and WBC 6.8>>11.1.  Pharmacy has been consulted for Zosyn dosing.  Plan: Zosyn 3.375g IV q8h given over 4 hours per pharm Monitor clinical status, cultures, renal function, LOT  Height: 5\' 10"  (177.8 cm) Weight: 157 lb 10.1 oz (71.5 kg) IBW/kg (Calculated) : 73  Temp (24hrs), Avg:99.6 F (37.6 C), Min:97.9 F (36.6 C), Max:102.6 F (39.2 C)  Recent Labs  Lab 01/31/19 0339 02/01/19 0243 02/03/19 0209 02/04/19 0152 02/05/19 0458  WBC 6.4 8.5 6.3 6.8 11.1*  CREATININE 1.81* 1.78* 1.75* 1.58* 1.48*    Estimated Creatinine Clearance: 56.4 mL/min (A) (by C-G formula based on SCr of 1.48 mg/dL (H)).    No Known Allergies  Antimicrobials this admission: 5/5 Unasyn >> 5/11 5/14 Zosyn >>  Microbiology results: 5/14 Resp cx sent 5/14 Bcx sent 5/13 rare GV rods, GPC pairs 5/4 Bcx Neg 5/1 MRSA negative 5/1 COVID neg  Thank you for involving pharmacy in this patient's care.  Wendelyn Breslow, PharmD PGY1 Pharmacy Resident Phone: [phone] 02/05/2019 8:18 AM
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
## 2019-02-06 ENCOUNTER — Inpatient Hospital Stay (HOSPITAL_COMMUNITY): Payer: Medicaid Other

## 2019-02-06 LAB — BLOOD CULTURE ID PANEL (REFLEXED)
Acinetobacter baumannii: NOT DETECTED
Candida albicans: NOT DETECTED
Candida glabrata: NOT DETECTED
Candida krusei: NOT DETECTED
Candida parapsilosis: NOT DETECTED
Candida tropicalis: NOT DETECTED
Enterobacter cloacae complex: NOT DETECTED
Enterobacteriaceae species: NOT DETECTED
Enterococcus species: NOT DETECTED
Escherichia coli: NOT DETECTED
Haemophilus influenzae: NOT DETECTED
Klebsiella oxytoca: NOT DETECTED
Klebsiella pneumoniae: NOT DETECTED
Listeria monocytogenes: NOT DETECTED
Methicillin resistance: NOT DETECTED
Neisseria meningitidis: NOT DETECTED
Proteus species: NOT DETECTED
Pseudomonas aeruginosa: NOT DETECTED
Serratia marcescens: NOT DETECTED
Staphylococcus aureus (BCID): NOT DETECTED
Staphylococcus species: DETECTED — AB
Streptococcus agalactiae: NOT DETECTED
Streptococcus pneumoniae: NOT DETECTED
Streptococcus pyogenes: NOT DETECTED
Streptococcus species: DETECTED — AB

## 2019-02-06 LAB — CULTURE, RESPIRATORY W GRAM STAIN
Gram Stain: NONE SEEN
Special Requests: NORMAL

## 2019-02-06 LAB — CBC
HCT: 26.7 % — ABNORMAL LOW (ref 39.0–52.0)
Hemoglobin: 8.4 g/dL — ABNORMAL LOW (ref 13.0–17.0)
MCH: 31.1 pg (ref 26.0–34.0)
MCHC: 31.5 g/dL (ref 30.0–36.0)
MCV: 98.9 fL (ref 80.0–100.0)
Platelets: 217 10*3/uL (ref 150–400)
RBC: 2.7 MIL/uL — ABNORMAL LOW (ref 4.22–5.81)
RDW: 14.9 % (ref 11.5–15.5)
WBC: 9.1 10*3/uL (ref 4.0–10.5)
nRBC: 0 % (ref 0.0–0.2)

## 2019-02-06 LAB — BASIC METABOLIC PANEL
Anion gap: 6 (ref 5–15)
BUN: 65 mg/dL — ABNORMAL HIGH (ref 6–20)
CO2: 19 mmol/L — ABNORMAL LOW (ref 22–32)
Calcium: 8.5 mg/dL — ABNORMAL LOW (ref 8.9–10.3)
Chloride: 123 mmol/L — ABNORMAL HIGH (ref 98–111)
Creatinine, Ser: 1.4 mg/dL — ABNORMAL HIGH (ref 0.61–1.24)
GFR calc Af Amer: >60 mL/min (ref >60)
GFR calc non Af Amer: 56 mL/min — ABNORMAL LOW (ref >60)
Glucose, Bld: 133 mg/dL — ABNORMAL HIGH (ref 70–99)
Potassium: 4.2 mmol/L (ref 3.5–5.1)
Sodium: 148 mmol/L — ABNORMAL HIGH (ref 135–145)

## 2019-02-06 LAB — GLUCOSE, CAPILLARY
Glucose-Capillary: 113 mg/dL — ABNORMAL HIGH (ref 70–99)
Glucose-Capillary: 130 mg/dL — ABNORMAL HIGH (ref 70–99)
Glucose-Capillary: 128 mg/dL — ABNORMAL HIGH (ref 70–99)
Glucose-Capillary: 113 mg/dL — ABNORMAL HIGH (ref 70–99)
Glucose-Capillary: 104 mg/dL — ABNORMAL HIGH (ref 70–99)
Glucose-Capillary: 116 mg/dL — ABNORMAL HIGH (ref 70–99)

## 2019-02-06 MED ORDER — FREE WATER
300.0000 mL | Freq: Four times a day (QID) | Status: DC
  Administered 2019-02-06 – 2019-02-19 (×50): 300 mL
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
## 2019-02-07 ENCOUNTER — Inpatient Hospital Stay (HOSPITAL_COMMUNITY): Payer: Medicaid Other

## 2019-02-07 LAB — GLUCOSE, CAPILLARY
Glucose-Capillary: 124 mg/dL — ABNORMAL HIGH (ref 70–99)
Glucose-Capillary: 120 mg/dL — ABNORMAL HIGH (ref 70–99)
Glucose-Capillary: 104 mg/dL — ABNORMAL HIGH (ref 70–99)
Glucose-Capillary: 112 mg/dL — ABNORMAL HIGH (ref 70–99)
Glucose-Capillary: 115 mg/dL — ABNORMAL HIGH (ref 70–99)
Glucose-Capillary: 113 mg/dL — ABNORMAL HIGH (ref 70–99)

## 2019-02-07 LAB — CBC
HCT: 25.4 % — ABNORMAL LOW (ref 39.0–52.0)
Hemoglobin: 8.2 g/dL — ABNORMAL LOW (ref 13.0–17.0)
MCH: 32 pg (ref 26.0–34.0)
MCHC: 32.3 g/dL (ref 30.0–36.0)
MCV: 99.2 fL (ref 80.0–100.0)
Platelets: 212 10*3/uL (ref 150–400)
RBC: 2.56 MIL/uL — ABNORMAL LOW (ref 4.22–5.81)
RDW: 14.1 % (ref 11.5–15.5)
WBC: 8.3 10*3/uL (ref 4.0–10.5)
nRBC: 0 % (ref 0.0–0.2)

## 2019-02-07 LAB — BASIC METABOLIC PANEL
Anion gap: 11 (ref 5–15)
BUN: 56 mg/dL — ABNORMAL HIGH (ref 6–20)
CO2: 18 mmol/L — ABNORMAL LOW (ref 22–32)
Calcium: 8.6 mg/dL — ABNORMAL LOW (ref 8.9–10.3)
Chloride: 119 mmol/L — ABNORMAL HIGH (ref 98–111)
Creatinine, Ser: 1.35 mg/dL — ABNORMAL HIGH (ref 0.61–1.24)
GFR calc Af Amer: >60 mL/min (ref >60)
GFR calc non Af Amer: 58 mL/min — ABNORMAL LOW (ref >60)
Glucose, Bld: 118 mg/dL — ABNORMAL HIGH (ref 70–99)
Potassium: 4.2 mmol/L (ref 3.5–5.1)
Sodium: 148 mmol/L — ABNORMAL HIGH (ref 135–145)

## 2019-02-07 MED ORDER — DEXMEDETOMIDINE HCL IN NACL 400 MCG/100ML IV SOLN
0.4000 ug/kg/h | INTRAVENOUS | Status: DC
  Administered 2019-02-07: 1.1 ug/kg/h via INTRAVENOUS
  Administered 2019-02-07: 1 ug/kg/h via INTRAVENOUS
  Administered 2019-02-07: 16:00:00 1.1 ug/kg/h via INTRAVENOUS
  Administered 2019-02-08 – 2019-02-09 (×7): 1 ug/kg/h via INTRAVENOUS
  Administered 2019-02-09: 1.2 ug/kg/h via INTRAVENOUS
  Administered 2019-02-10: 05:00:00 1 ug/kg/h via INTRAVENOUS
  Administered 2019-02-10: 18:00:00 0.6 ug/kg/h via INTRAVENOUS
  Administered 2019-02-10: 10:00:00 0.8 ug/kg/h via INTRAVENOUS
  Administered 2019-02-11: 03:00:00 0.6 ug/kg/h via INTRAVENOUS
  Administered 2019-02-11: 18:00:00 0.3 ug/kg/h via INTRAVENOUS
  Administered 2019-02-12: 12:00:00 0.8 ug/kg/h via INTRAVENOUS
  Administered 2019-02-12 – 2019-02-13 (×2): 0.6 ug/kg/h via INTRAVENOUS
  Administered 2019-02-13: 03:00:00 0.7 ug/kg/h via INTRAVENOUS
  Administered 2019-02-13: 0.6 ug/kg/h via INTRAVENOUS
  Administered 2019-02-14 (×2): 0.4 ug/kg/h via INTRAVENOUS
  Administered 2019-02-15: 0.6 ug/kg/h via INTRAVENOUS
  Administered 2019-02-16: 0.7 ug/kg/h via INTRAVENOUS
  Administered 2019-02-16: 08:00:00 0.5 ug/kg/h via INTRAVENOUS
  Filled 2019-02-07 (×14): qty 100
  Filled 2019-02-07: qty 200
  Filled 2019-02-07 (×6): qty 100

## 2019-02-07 MED ORDER — CHLORHEXIDINE GLUCONATE CLOTH 2 % EX PADS
6.0000 | MEDICATED_PAD | Freq: Every day | CUTANEOUS | Status: DC
  Administered 2019-02-08 – 2019-02-11 (×5): 6 via TOPICAL
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
## 2019-02-07 NOTE — Progress Notes (Signed)
RT note-xray noted with ETT 6.3 above carina, placed on wean, volumes ranging from 450-880ml's, continue to monitor.

## 2019-02-08 ENCOUNTER — Inpatient Hospital Stay (HOSPITAL_COMMUNITY): Payer: Medicaid Other

## 2019-02-08 LAB — BASIC METABOLIC PANEL
Anion gap: 5 (ref 5–15)
BUN: 51 mg/dL — ABNORMAL HIGH (ref 6–20)
CO2: 19 mmol/L — ABNORMAL LOW (ref 22–32)
Calcium: 8.2 mg/dL — ABNORMAL LOW (ref 8.9–10.3)
Chloride: 120 mmol/L — ABNORMAL HIGH (ref 98–111)
Creatinine, Ser: 1.32 mg/dL — ABNORMAL HIGH (ref 0.61–1.24)
GFR calc Af Amer: >60 mL/min (ref >60)
GFR calc non Af Amer: 60 mL/min — ABNORMAL LOW (ref >60)
Glucose, Bld: 110 mg/dL — ABNORMAL HIGH (ref 70–99)
Potassium: 4.3 mmol/L (ref 3.5–5.1)
Sodium: 144 mmol/L (ref 135–145)

## 2019-02-08 LAB — CBC
HCT: 26 % — ABNORMAL LOW (ref 39.0–52.0)
Hemoglobin: 8.3 g/dL — ABNORMAL LOW (ref 13.0–17.0)
MCH: 31.4 pg (ref 26.0–34.0)
MCHC: 31.9 g/dL (ref 30.0–36.0)
MCV: 98.5 fL (ref 80.0–100.0)
Platelets: 224 K/uL (ref 150–400)
RBC: 2.64 MIL/uL — ABNORMAL LOW (ref 4.22–5.81)
RDW: 13.4 % (ref 11.5–15.5)
WBC: 8.2 K/uL (ref 4.0–10.5)
nRBC: 0 % (ref 0.0–0.2)

## 2019-02-08 LAB — GLUCOSE, CAPILLARY
Glucose-Capillary: 101 mg/dL — ABNORMAL HIGH (ref 70–99)
Glucose-Capillary: 122 mg/dL — ABNORMAL HIGH (ref 70–99)
Glucose-Capillary: 106 mg/dL — ABNORMAL HIGH (ref 70–99)
Glucose-Capillary: 105 mg/dL — ABNORMAL HIGH (ref 70–99)
Glucose-Capillary: 102 mg/dL — ABNORMAL HIGH (ref 70–99)
Glucose-Capillary: 121 mg/dL — ABNORMAL HIGH (ref 70–99)

## 2019-02-08 LAB — TRIGLYCERIDES: Triglycerides: 76 mg/dL (ref <150)

## 2019-02-08 MED ORDER — FUROSEMIDE 10 MG/ML IJ SOLN: 20.0000 mg | Freq: Once | INTRAMUSCULAR | Status: DC

## 2019-02-08 MED ORDER — HYDROCHLOROTHIAZIDE 12.5 MG PO CAPS
12.5000 mg | ORAL_CAPSULE | Freq: Every day | ORAL | Status: DC
  Administered 2019-02-08 – 2019-02-12 (×5): 12.5 mg
  Filled 2019-02-08 (×7): qty 1
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
## 2019-02-09 ENCOUNTER — Inpatient Hospital Stay (HOSPITAL_COMMUNITY): Payer: Medicaid Other

## 2019-02-09 LAB — COMPREHENSIVE METABOLIC PANEL
ALT: 78 U/L — ABNORMAL HIGH (ref 0–44)
AST: 53 U/L — ABNORMAL HIGH (ref 15–41)
Albumin: 1.9 g/dL — ABNORMAL LOW (ref 3.5–5.0)
Alkaline Phosphatase: 63 U/L (ref 38–126)
Anion gap: 4 — ABNORMAL LOW (ref 5–15)
BUN: 46 mg/dL — ABNORMAL HIGH (ref 6–20)
CO2: 19 mmol/L — ABNORMAL LOW (ref 22–32)
Calcium: 8.4 mg/dL — ABNORMAL LOW (ref 8.9–10.3)
Chloride: 118 mmol/L — ABNORMAL HIGH (ref 98–111)
Creatinine, Ser: 1.19 mg/dL (ref 0.61–1.24)
GFR calc Af Amer: >60 mL/min (ref >60)
GFR calc non Af Amer: >60 mL/min (ref >60)
Glucose, Bld: 134 mg/dL — ABNORMAL HIGH (ref 70–99)
Potassium: 3.9 mmol/L (ref 3.5–5.1)
Sodium: 141 mmol/L (ref 135–145)
Total Bilirubin: 0.4 mg/dL (ref 0.3–1.2)
Total Protein: 5.2 g/dL — ABNORMAL LOW (ref 6.5–8.1)

## 2019-02-09 LAB — GLUCOSE, CAPILLARY
Glucose-Capillary: 117 mg/dL — ABNORMAL HIGH (ref 70–99)
Glucose-Capillary: 121 mg/dL — ABNORMAL HIGH (ref 70–99)
Glucose-Capillary: 101 mg/dL — ABNORMAL HIGH (ref 70–99)
Glucose-Capillary: 103 mg/dL — ABNORMAL HIGH (ref 70–99)
Glucose-Capillary: 96 mg/dL (ref 70–99)
Glucose-Capillary: 111 mg/dL — ABNORMAL HIGH (ref 70–99)

## 2019-02-09 LAB — CBC
HCT: 26.1 % — ABNORMAL LOW (ref 39.0–52.0)
Hemoglobin: 8.3 g/dL — ABNORMAL LOW (ref 13.0–17.0)
MCH: 31.1 pg (ref 26.0–34.0)
MCHC: 31.8 g/dL (ref 30.0–36.0)
MCV: 97.8 fL (ref 80.0–100.0)
Platelets: 204 10*3/uL (ref 150–400)
RBC: 2.67 MIL/uL — ABNORMAL LOW (ref 4.22–5.81)
RDW: 13.2 % (ref 11.5–15.5)
WBC: 7.5 10*3/uL (ref 4.0–10.5)
nRBC: 0 % (ref 0.0–0.2)

## 2019-02-09 MED ORDER — SODIUM CHLORIDE 0.9 % IV SOLN
2.0000 g | INTRAVENOUS | Status: AC
  Administered 2019-02-09 – 2019-02-11 (×3): 2 g via INTRAVENOUS
  Filled 2019-02-09 (×3): qty 20

## 2019-02-09 NOTE — Progress Notes (Signed)
Patient ID: Jose [name], male   DOB: [birth year], 58 y.o.   MRN: [id number] Tolerated extubation well so far. No posterior neck tenderness, no pain on AROM - collar D/Cd. Will order therapies.  Violeta Gelinas, MD, MPH, FACS Trauma & General Surgery: [phone]
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
## 2019-02-09 NOTE — Procedures (Signed)
Extubation Procedure Note  Patient Details:   Name: Jose [name] DOB: 08-08-1961 MRN: [id number]   Airway Documentation:    Vent end date: 02/09/19 Vent end time: 0853   Evaluation  O2 sats: stable throughout Complications: No apparent complications Patient did tolerate procedure well. Bilateral Breath Sounds: Diminished   Yes   Patient extubated per order to 4L [place] with no apparent complications. Positive cuff leak was noted prior to extubation. Patient is alert and is able to weakly speak. Vitals are stable. RT will continue to monitor.   Britney Newstrom Lajuana Ripple 02/09/2019, 8:57 AM

## 2019-02-10 ENCOUNTER — Inpatient Hospital Stay (HOSPITAL_COMMUNITY): Payer: Medicaid Other

## 2019-02-10 ENCOUNTER — Inpatient Hospital Stay (HOSPITAL_COMMUNITY): Payer: Medicaid Other | Admitting: Anesthesiology

## 2019-02-10 LAB — BASIC METABOLIC PANEL
Anion gap: 8 (ref 5–15)
BUN: 31 mg/dL — ABNORMAL HIGH (ref 6–20)
CO2: 19 mmol/L — ABNORMAL LOW (ref 22–32)
Calcium: 8.7 mg/dL — ABNORMAL LOW (ref 8.9–10.3)
Chloride: 114 mmol/L — ABNORMAL HIGH (ref 98–111)
Creatinine, Ser: 1.04 mg/dL (ref 0.61–1.24)
GFR calc Af Amer: >60 mL/min (ref >60)
GFR calc non Af Amer: >60 mL/min (ref >60)
Glucose, Bld: 103 mg/dL — ABNORMAL HIGH (ref 70–99)
Potassium: 3.7 mmol/L (ref 3.5–5.1)
Sodium: 141 mmol/L (ref 135–145)

## 2019-02-10 LAB — GLUCOSE, CAPILLARY
Glucose-Capillary: 111 mg/dL — ABNORMAL HIGH (ref 70–99)
Glucose-Capillary: 92 mg/dL (ref 70–99)
Glucose-Capillary: 102 mg/dL — ABNORMAL HIGH (ref 70–99)
Glucose-Capillary: 143 mg/dL — ABNORMAL HIGH (ref 70–99)
Glucose-Capillary: 122 mg/dL — ABNORMAL HIGH (ref 70–99)
Glucose-Capillary: 138 mg/dL — ABNORMAL HIGH (ref 70–99)

## 2019-02-10 LAB — POCT I-STAT 7, (LYTES, BLD GAS, ICA,H+H)
Acid-base deficit: 6 mmol/L — ABNORMAL HIGH (ref 0.0–2.0)
Bicarbonate: 18.1 mmol/L — ABNORMAL LOW (ref 20.0–28.0)
Calcium, Ion: 1.32 mmol/L (ref 1.15–1.40)
HCT: 23 % — ABNORMAL LOW (ref 39.0–52.0)
Hemoglobin: 7.8 g/dL — ABNORMAL LOW (ref 13.0–17.0)
O2 Saturation: 97 %
Patient temperature: 98.6
Potassium: 3.8 mmol/L (ref 3.5–5.1)
Sodium: 144 mmol/L (ref 135–145)
TCO2: 19 mmol/L — ABNORMAL LOW (ref 22–32)
pCO2 arterial: 28.2 mmHg — ABNORMAL LOW (ref 32.0–48.0)
pH, Arterial: 7.415 (ref 7.350–7.450)
pO2, Arterial: 89 mmHg (ref 83.0–108.0)

## 2019-02-10 LAB — CULTURE, BLOOD (ROUTINE X 2)
Culture: NO GROWTH
Special Requests: ADEQUATE

## 2019-02-10 LAB — CBC
HCT: 27.4 % — ABNORMAL LOW (ref 39.0–52.0)
Hemoglobin: 9 g/dL — ABNORMAL LOW (ref 13.0–17.0)
MCH: 31.9 pg (ref 26.0–34.0)
MCHC: 32.8 g/dL (ref 30.0–36.0)
MCV: 97.2 fL (ref 80.0–100.0)
Platelets: 201 10*3/uL (ref 150–400)
RBC: 2.82 MIL/uL — ABNORMAL LOW (ref 4.22–5.81)
RDW: 12.7 % (ref 11.5–15.5)
WBC: 8.7 10*3/uL (ref 4.0–10.5)
nRBC: 0 % (ref 0.0–0.2)

## 2019-02-10 LAB — TRIGLYCERIDES: Triglycerides: 119 mg/dL (ref <150)

## 2019-02-10 MED ORDER — PROPOFOL 1000 MG/100ML IV EMUL
5.0000 ug/kg/min | INTRAVENOUS | Status: DC
  Administered 2019-02-10: 5 ug/kg/min via INTRAVENOUS
  Filled 2019-02-10: qty 100

## 2019-02-10 MED ORDER — CLONIDINE HCL 0.2 MG PO TABS
0.3000 mg | ORAL_TABLET | Freq: Four times a day (QID) | ORAL | Status: DC
  Administered 2019-02-10 – 2019-02-16 (×25): 0.3 mg
  Filled 2019-02-10 (×25): qty 1

## 2019-02-10 MED ORDER — ALBUMIN HUMAN 5 % IV SOLN
25.0000 g | Freq: Once | INTRAVENOUS | Status: AC
  Administered 2019-02-10: 25 g via INTRAVENOUS
  Filled 2019-02-10: qty 250

## 2019-02-10 MED ORDER — QUETIAPINE FUMARATE 25 MG PO TABS
50.0000 mg | ORAL_TABLET | Freq: Two times a day (BID) | ORAL | Status: DC
  Administered 2019-02-10 – 2019-02-16 (×14): 50 mg
  Filled 2019-02-10 (×15): qty 2

## 2019-02-10 MED ORDER — PROPOFOL 10 MG/ML IV BOLUS
INTRAVENOUS | Status: DC | PRN
  Administered 2019-02-10: 30 mg via INTRAVENOUS
  Administered 2019-02-10: 50 mg via INTRAVENOUS

## 2019-02-10 MED ORDER — SUCCINYLCHOLINE CHLORIDE 20 MG/ML IJ SOLN
INTRAMUSCULAR | Status: DC | PRN
  Administered 2019-02-10: 60 mg via INTRAVENOUS

## 2019-02-10 MED ORDER — CLONAZEPAM 0.5 MG PO TBDP
0.5000 mg | ORAL_TABLET | Freq: Two times a day (BID) | ORAL | Status: DC
  Administered 2019-02-10 – 2019-02-16 (×14): 0.5 mg
  Filled 2019-02-10 (×14): qty 1

## 2019-02-10 MED ORDER — PANTOPRAZOLE SODIUM 40 MG PO PACK
40.0000 mg | PACK | Freq: Every day | ORAL | Status: DC
  Administered 2019-02-10 – 2019-03-18 (×34): 40 mg
  Filled 2019-02-10 (×40): qty 20

## 2019-02-10 NOTE — Progress Notes (Signed)
Pt. 02 saturations dropped throughout the night and required increased 02- he maintained in the 90s on 10L NRB. Increased agitation and AMS. BP also continued to increase and wouldn't respond to PRN medications.   Pt. Went into 170-180's and converting in and out of afib with respirations in the 30's and BP >180 - paged Dr.Cornett and advised we re-intubate patient.   Pt. Now resting comfortably- BP, HR and respirations all within range. Will continue to monitor.   Sherrie George, RN BSN

## 2019-02-10 NOTE — Progress Notes (Signed)
PT Cancellation Note  Patient Details Name: ELIESER GARVIS MRN: [id number] DOB: 07-16-1961   Cancelled Treatment:    Reason Eval/Treat Not Completed: Patient not medically ready;Active bedrest order(reintubated)   Fabio Asa 02/10/2019, 2:57 PM Charlotte Crumb, PT DPT  Board Certified Neurologic Specialist Acute Rehabilitation Services Pager [phone] Office [phone]
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
## 2019-02-10 NOTE — Progress Notes (Signed)
Patient ID: Jose [name], male   DOB: 05/31/1961, 58 y.o.   MRN: [id number] I spoke with his brother, Dorinda Hill, and updated him on the plan of care. I also discussed the planned tracheostomy 5/21 including the procedure, risks, and benefits. He agrees. Phone consent documented.  Violeta Gelinas, MD, MPH, FACS Trauma & General Surgery: [phone]

## 2019-02-10 NOTE — Anesthesia Procedure Notes (Addendum)
Procedure Name: Intubation Date/Time: 02/10/2019 1:45 AM Performed by: Edmonia Caprio, CRNA Pre-anesthesia Checklist: Patient identified, Emergency Drugs available, Suction available, Patient being monitored and Timeout performed Patient Re-evaluated:Patient Re-evaluated prior to induction Oxygen Delivery Method: Ambu bag Preoxygenation: Pre-oxygenation with 100% oxygen Induction Type: IV induction and Rapid sequence Laryngoscope Size: Glidescope and 4 Grade View: Grade I Tube type: Oral Tube size: 7.5 mm Number of attempts: 1 Airway Equipment and Method: Stylet and Video-laryngoscopy Placement Confirmation: CO2 detector Secured at: 24 cm Tube secured with: secured by RT with commercial ETT holder. Dental Injury: Teeth and Oropharynx as per pre-operative assessment

## 2019-02-10 NOTE — Progress Notes (Signed)
OT Cancellation Note  Patient Details Name: Jose [name] MRN: [id number] DOB: 03/22/1961   Cancelled Treatment:    Reason Eval/Treat Not Completed: Patient not medically ready;Active bedrest order; reintubated last PM and with active bedrest orders. Will follow up for OT eval as appropriate.  Marcy Siren, OT Supplemental Rehabilitation Services Pager [phone] Office [phone]   Orlando Penner 02/10/2019, 2:08 PM

## 2019-02-10 NOTE — Progress Notes (Signed)
SLP Cancellation Note  Patient Details Name: Jose [name] MRN: [id number] DOB: 1960/10/24   Cancelled treatment:        Pt now intubated. Will follow.   Royce Macadamia 02/10/2019, 1:19 PM   Breck Coons Lonell Face.Ed Nurse, children's [phone] Office [phone]

## 2019-02-11 ENCOUNTER — Inpatient Hospital Stay (HOSPITAL_COMMUNITY): Payer: Medicaid Other

## 2019-02-11 LAB — CULTURE, BLOOD (ROUTINE X 2): Special Requests: ADEQUATE

## 2019-02-11 LAB — GLUCOSE, CAPILLARY
Glucose-Capillary: 129 mg/dL — ABNORMAL HIGH (ref 70–99)
Glucose-Capillary: 121 mg/dL — ABNORMAL HIGH (ref 70–99)
Glucose-Capillary: 131 mg/dL — ABNORMAL HIGH (ref 70–99)
Glucose-Capillary: 125 mg/dL — ABNORMAL HIGH (ref 70–99)
Glucose-Capillary: 104 mg/dL — ABNORMAL HIGH (ref 70–99)
Glucose-Capillary: 126 mg/dL — ABNORMAL HIGH (ref 70–99)

## 2019-02-11 LAB — BASIC METABOLIC PANEL
Anion gap: 8 (ref 5–15)
BUN: 31 mg/dL — ABNORMAL HIGH (ref 6–20)
CO2: 19 mmol/L — ABNORMAL LOW (ref 22–32)
Calcium: 8.5 mg/dL — ABNORMAL LOW (ref 8.9–10.3)
Chloride: 110 mmol/L (ref 98–111)
Creatinine, Ser: 0.87 mg/dL (ref 0.61–1.24)
GFR calc Af Amer: >60 mL/min (ref >60)
GFR calc non Af Amer: >60 mL/min (ref >60)
Glucose, Bld: 127 mg/dL — ABNORMAL HIGH (ref 70–99)
Potassium: 3.8 mmol/L (ref 3.5–5.1)
Sodium: 137 mmol/L (ref 135–145)

## 2019-02-11 LAB — CBC
HCT: 24.4 % — ABNORMAL LOW (ref 39.0–52.0)
Hemoglobin: 8 g/dL — ABNORMAL LOW (ref 13.0–17.0)
MCH: 31.7 pg (ref 26.0–34.0)
MCHC: 32.8 g/dL (ref 30.0–36.0)
MCV: 96.8 fL (ref 80.0–100.0)
Platelets: 168 10*3/uL (ref 150–400)
RBC: 2.52 MIL/uL — ABNORMAL LOW (ref 4.22–5.81)
RDW: 12.8 % (ref 11.5–15.5)
WBC: 7.6 10*3/uL (ref 4.0–10.5)
nRBC: 0 % (ref 0.0–0.2)

## 2019-02-11 MED ORDER — VITAMIN B-1 100 MG PO TABS
100.0000 mg | ORAL_TABLET | Freq: Every day | ORAL | Status: DC
  Administered 2019-02-12 – 2019-03-18 (×35): 100 mg
  Filled 2019-02-11 (×35): qty 1

## 2019-02-11 MED ORDER — THIAMINE HCL 100 MG/ML IJ SOLN
100.0000 mg | Freq: Every day | INTRAMUSCULAR | Status: DC
  Filled 2019-02-11: qty 2

## 2019-02-11 MED ORDER — DOCUSATE SODIUM 50 MG/5ML PO LIQD
50.0000 mg | Freq: Every day | ORAL | Status: DC
  Administered 2019-02-11 – 2019-03-18 (×30): 50 mg
  Filled 2019-02-11 (×33): qty 10
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
## 2019-02-11 NOTE — Progress Notes (Signed)
SLP Cancellation Note  Patient Details Name: Jose [name] MRN: [id number] DOB: [birth year]   Cancelled treatment:        Requiring mechanical ventilation. Will continue to follow progress.   Royce Macadamia 02/11/2019, 12:50 PM  Breck Coons Lonell Face.Ed Nurse, children's [phone] Office [phone]
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
## 2019-02-11 NOTE — Addendum Note (Signed)
Addendum  created 02/11/19 1631 by Val Eagle, MD   Attestation recorded in Intraprocedure, Intraprocedure Attestations filed
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
## 2019-02-12 ENCOUNTER — Encounter (HOSPITAL_COMMUNITY): Admission: EM | Disposition: A | Discharge status: Skilled Nursing Facility | Payer: Self-pay | Source: Home / Self Care

## 2019-02-12 ENCOUNTER — Inpatient Hospital Stay (HOSPITAL_COMMUNITY): Payer: Medicaid Other

## 2019-02-12 HISTORY — PX: PERCUTANEOUS TRACHEOSTOMY: SHX5288

## 2019-02-12 LAB — GLUCOSE, CAPILLARY
Glucose-Capillary: 107 mg/dL — ABNORMAL HIGH (ref 70–99)
Glucose-Capillary: 105 mg/dL — ABNORMAL HIGH (ref 70–99)
Glucose-Capillary: 108 mg/dL — ABNORMAL HIGH (ref 70–99)
Glucose-Capillary: 116 mg/dL — ABNORMAL HIGH (ref 70–99)
Glucose-Capillary: 123 mg/dL — ABNORMAL HIGH (ref 70–99)
Glucose-Capillary: 137 mg/dL — ABNORMAL HIGH (ref 70–99)

## 2019-02-12 SURGERY — CREATION, TRACHEOSTOMY, PERCUTANEOUS
Anesthesia: LOCAL | Site: Neck

## 2019-02-12 MED ORDER — FENTANYL CITRATE (PF) 100 MCG/2ML IJ SOLN
100.0000 ug | Freq: Once | INTRAMUSCULAR | Status: AC
  Administered 2019-02-12: 10:00:00 100 ug via INTRAVENOUS
  Filled 2019-02-12: qty 2

## 2019-02-12 MED ORDER — LIDOCAINE-EPINEPHRINE (PF) 1.5 %-1:200000 IJ SOLN
INTRAMUSCULAR | Status: DC | PRN
  Administered 2019-02-12: 10 mL via INTRADERMAL

## 2019-02-12 MED ORDER — SODIUM CHLORIDE 0.9% FLUSH
INTRAVENOUS | Status: DC | PRN
  Administered 2019-02-12 (×3): 10 mL

## 2019-02-12 MED ORDER — MIDAZOLAM HCL 2 MG/2ML IJ SOLN
2.0000 mg | Freq: Once | INTRAMUSCULAR | Status: AC
  Administered 2019-02-12: 10:00:00 2 mg via INTRAVENOUS
  Filled 2019-02-12: qty 2

## 2019-02-12 MED ORDER — VECURONIUM BROMIDE 10 MG IV SOLR
10.0000 mg | Freq: Once | INTRAVENOUS | Status: AC
  Administered 2019-02-12: 10:00:00 10 mg via INTRAVENOUS
  Filled 2019-02-12: qty 10

## 2019-02-12 MED ORDER — CHLORHEXIDINE GLUCONATE CLOTH 2 % EX PADS
6.0000 | MEDICATED_PAD | Freq: Every day | CUTANEOUS | Status: DC
  Administered 2019-02-13 – 2019-03-04 (×18): 6 via TOPICAL

## 2019-02-12 SURGICAL SUPPLY — 30 items
DRAPE HALF SHEET 40X57 (DRAPES) ×4 IMPLANT
DRAPE UTILITY XL STRL (DRAPES) ×2 IMPLANT
DRESSING ALLEVYN 2X4 GNTL LT (GAUZE/BANDAGES/DRESSINGS) ×2 IMPLANT
ELECT CAUTERY BLADE 6.4 (BLADE) ×2 IMPLANT
ELECT REM PT RETURN 9FT ADLT (ELECTROSURGICAL) ×2
ELECTRODE REM PT RTRN 9FT ADLT (ELECTROSURGICAL) ×1 IMPLANT
GAUZE 4X4 16PLY RFD (DISPOSABLE) ×2 IMPLANT
GLOVE BIO SURGEON STRL SZ 6 (GLOVE) IMPLANT
GLOVE BIO SURGEON STRL SZ8 (GLOVE) ×2 IMPLANT
GLOVE BIOGEL PI IND STRL 6.5 (GLOVE) ×1 IMPLANT
GLOVE BIOGEL PI IND STRL 7.5 (GLOVE) ×1 IMPLANT
GLOVE BIOGEL PI IND STRL 8 (GLOVE) ×1 IMPLANT
GLOVE BIOGEL PI INDICATOR 6.5 (GLOVE) ×1
GLOVE BIOGEL PI INDICATOR 7.5 (GLOVE) ×1
GLOVE BIOGEL PI INDICATOR 8 (GLOVE) ×1
GLOVE ECLIPSE 7.5 STRL STRAW (GLOVE) ×2 IMPLANT
GOWN STRL REUS W/ TWL LRG LVL3 (GOWN DISPOSABLE) ×2 IMPLANT
GOWN STRL REUS W/ TWL XL LVL3 (GOWN DISPOSABLE) ×1 IMPLANT
GOWN STRL REUS W/TWL LRG LVL3 (GOWN DISPOSABLE) ×2
GOWN STRL REUS W/TWL XL LVL3 (GOWN DISPOSABLE) ×1
INTRODUCER TRACH BLUE RHINO 6F (TUBING) ×2 IMPLANT
INTRODUCER TRACH BLUE RHINO 8F (TUBING) IMPLANT
PENCIL BUTTON HOLSTER BLD 10FT (ELECTRODE) ×2 IMPLANT
SPONGE DRAIN TRACH 4X4 STRL 2S (GAUZE/BANDAGES/DRESSINGS) ×2 IMPLANT
SPONGE INTESTINAL PEANUT (DISPOSABLE) ×2 IMPLANT
SUT SILK 3 0 SH CR/8 (SUTURE) ×2 IMPLANT
SUT VICRYL AB 3 0 TIES (SUTURE) IMPLANT
TOWEL OR 17X24 6PK STRL BLUE (TOWEL DISPOSABLE) ×2 IMPLANT
TUBE CONNECTING 12X1/4 (SUCTIONS) ×2 IMPLANT
YANKAUER SUCT BULB TIP NO VENT (SUCTIONS) ×2 IMPLANT

## 2019-02-12 NOTE — Plan of Care (Signed)
Progressing on all goals at this time.  

## 2019-02-12 NOTE — Progress Notes (Signed)
Spoke with SW- will contact brother Dorinda Hill tomorrow in AM.

## 2019-02-12 NOTE — Progress Notes (Signed)
PT Cancellation Note  Patient Details Name: Jose [name] MRN: [id number] DOB: 1961/03/31   Cancelled Treatment:    Reason Eval/Treat Not Completed: Patient at procedure or test/unavailable (trach placement).  Laurina Bustle, PT, DPT Acute Rehabilitation Services Pager [phone] Office [phone]    Vanetta Mulders 02/12/2019, 11:12 AM
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
## 2019-02-13 ENCOUNTER — Inpatient Hospital Stay (HOSPITAL_COMMUNITY): Payer: Medicaid Other

## 2019-02-13 ENCOUNTER — Encounter (HOSPITAL_COMMUNITY): Payer: Self-pay | Admitting: General Surgery

## 2019-02-13 LAB — BASIC METABOLIC PANEL
Anion gap: 10 (ref 5–15)
BUN: 27 mg/dL — ABNORMAL HIGH (ref 6–20)
CO2: 22 mmol/L (ref 22–32)
Calcium: 8.7 mg/dL — ABNORMAL LOW (ref 8.9–10.3)
Chloride: 104 mmol/L (ref 98–111)
Creatinine, Ser: 0.89 mg/dL (ref 0.61–1.24)
GFR calc Af Amer: >60 mL/min (ref >60)
GFR calc non Af Amer: >60 mL/min (ref >60)
Glucose, Bld: 147 mg/dL — ABNORMAL HIGH (ref 70–99)
Potassium: 3.8 mmol/L (ref 3.5–5.1)
Sodium: 136 mmol/L (ref 135–145)

## 2019-02-13 LAB — CBC
HCT: 25.1 % — ABNORMAL LOW (ref 39.0–52.0)
Hemoglobin: 8.7 g/dL — ABNORMAL LOW (ref 13.0–17.0)
MCH: 31.8 pg (ref 26.0–34.0)
MCHC: 34.7 g/dL (ref 30.0–36.0)
MCV: 91.6 fL (ref 80.0–100.0)
Platelets: 153 10*3/uL (ref 150–400)
RBC: 2.74 MIL/uL — ABNORMAL LOW (ref 4.22–5.81)
RDW: 12.6 % (ref 11.5–15.5)
WBC: 8.9 10*3/uL (ref 4.0–10.5)
nRBC: 0 % (ref 0.0–0.2)

## 2019-02-13 LAB — GLUCOSE, CAPILLARY
Glucose-Capillary: 126 mg/dL — ABNORMAL HIGH (ref 70–99)
Glucose-Capillary: 142 mg/dL — ABNORMAL HIGH (ref 70–99)
Glucose-Capillary: 124 mg/dL — ABNORMAL HIGH (ref 70–99)
Glucose-Capillary: 105 mg/dL — ABNORMAL HIGH (ref 70–99)
Glucose-Capillary: 109 mg/dL — ABNORMAL HIGH (ref 70–99)
Glucose-Capillary: 102 mg/dL — ABNORMAL HIGH (ref 70–99)

## 2019-02-13 LAB — TRIGLYCERIDES: Triglycerides: 81 mg/dL (ref <150)

## 2019-02-13 MED ORDER — HYDROCHLOROTHIAZIDE 12.5 MG PO CAPS
25.0000 mg | ORAL_CAPSULE | Freq: Every day | ORAL | Status: DC
  Administered 2019-02-13 – 2019-03-18 (×34): 25 mg
  Filled 2019-02-13 (×35): qty 2
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
## 2019-02-13 NOTE — Progress Notes (Signed)
CSW placed call to patient's brother Dorinda Hill with no answer, CSW left voicemail on Donald's work number requesting a return call.  Edwin Dada, MSW, LCSW-A Clinical Social Worker [facility] [phone]
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
## 2019-02-14 LAB — GLUCOSE, CAPILLARY
Glucose-Capillary: 110 mg/dL — ABNORMAL HIGH (ref 70–99)
Glucose-Capillary: 109 mg/dL — ABNORMAL HIGH (ref 70–99)
Glucose-Capillary: 122 mg/dL — ABNORMAL HIGH (ref 70–99)
Glucose-Capillary: 103 mg/dL — ABNORMAL HIGH (ref 70–99)
Glucose-Capillary: 113 mg/dL — ABNORMAL HIGH (ref 70–99)
Glucose-Capillary: 135 mg/dL — ABNORMAL HIGH (ref 70–99)

## 2019-02-14 MED ORDER — POLYETHYLENE GLYCOL 3350 17 G PO PACK
17.0000 g | PACK | Freq: Every day | ORAL | Status: DC
  Administered 2019-02-14 – 2019-03-18 (×26): 17 g
  Filled 2019-02-14 (×28): qty 1
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
## 2019-02-15 LAB — BASIC METABOLIC PANEL
Anion gap: 10 (ref 5–15)
BUN: 26 mg/dL — ABNORMAL HIGH (ref 6–20)
CO2: 26 mmol/L (ref 22–32)
Calcium: 8.7 mg/dL — ABNORMAL LOW (ref 8.9–10.3)
Chloride: 101 mmol/L (ref 98–111)
Creatinine, Ser: 0.86 mg/dL (ref 0.61–1.24)
GFR calc Af Amer: >60 mL/min (ref >60)
GFR calc non Af Amer: >60 mL/min (ref >60)
Glucose, Bld: 111 mg/dL — ABNORMAL HIGH (ref 70–99)
Potassium: 3.4 mmol/L — ABNORMAL LOW (ref 3.5–5.1)
Sodium: 137 mmol/L (ref 135–145)

## 2019-02-15 LAB — CBC
HCT: 24.2 % — ABNORMAL LOW (ref 39.0–52.0)
Hemoglobin: 8.2 g/dL — ABNORMAL LOW (ref 13.0–17.0)
MCH: 31.4 pg (ref 26.0–34.0)
MCHC: 33.9 g/dL (ref 30.0–36.0)
MCV: 92.7 fL (ref 80.0–100.0)
Platelets: 156 10*3/uL (ref 150–400)
RBC: 2.61 MIL/uL — ABNORMAL LOW (ref 4.22–5.81)
RDW: 12.6 % (ref 11.5–15.5)
WBC: 10.2 10*3/uL (ref 4.0–10.5)
nRBC: 0 % (ref 0.0–0.2)

## 2019-02-15 LAB — GLUCOSE, CAPILLARY
Glucose-Capillary: 103 mg/dL — ABNORMAL HIGH (ref 70–99)
Glucose-Capillary: 115 mg/dL — ABNORMAL HIGH (ref 70–99)
Glucose-Capillary: 116 mg/dL — ABNORMAL HIGH (ref 70–99)
Glucose-Capillary: 118 mg/dL — ABNORMAL HIGH (ref 70–99)
Glucose-Capillary: 103 mg/dL — ABNORMAL HIGH (ref 70–99)
Glucose-Capillary: 110 mg/dL — ABNORMAL HIGH (ref 70–99)

## 2019-02-15 MED ORDER — MAGNESIUM CITRATE PO SOLN
0.5000 | Freq: Once | ORAL | Status: AC
  Administered 2019-02-15: 0.5
  Filled 2019-02-15: qty 296
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
## 2019-02-16 LAB — TRIGLYCERIDES: Triglycerides: 89 mg/dL (ref <150)

## 2019-02-16 LAB — GLUCOSE, CAPILLARY
Glucose-Capillary: 134 mg/dL — ABNORMAL HIGH (ref 70–99)
Glucose-Capillary: 110 mg/dL — ABNORMAL HIGH (ref 70–99)
Glucose-Capillary: 153 mg/dL — ABNORMAL HIGH (ref 70–99)
Glucose-Capillary: 114 mg/dL — ABNORMAL HIGH (ref 70–99)
Glucose-Capillary: 115 mg/dL — ABNORMAL HIGH (ref 70–99)
Glucose-Capillary: 118 mg/dL — ABNORMAL HIGH (ref 70–99)

## 2019-02-16 MED ORDER — CLONIDINE HCL 0.2 MG PO TABS
0.4000 mg | ORAL_TABLET | Freq: Four times a day (QID) | ORAL | Status: DC
  Administered 2019-02-16 – 2019-02-17 (×3): 0.4 mg
  Filled 2019-02-16 (×3): qty 2
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
## 2019-02-17 LAB — BASIC METABOLIC PANEL
Anion gap: 8 (ref 5–15)
BUN: 25 mg/dL — ABNORMAL HIGH (ref 6–20)
CO2: 27 mmol/L (ref 22–32)
Calcium: 9 mg/dL (ref 8.9–10.3)
Chloride: 103 mmol/L (ref 98–111)
Creatinine, Ser: 0.82 mg/dL (ref 0.61–1.24)
GFR calc Af Amer: >60 mL/min (ref >60)
GFR calc non Af Amer: >60 mL/min (ref >60)
Glucose, Bld: 128 mg/dL — ABNORMAL HIGH (ref 70–99)
Potassium: 3.8 mmol/L (ref 3.5–5.1)
Sodium: 138 mmol/L (ref 135–145)

## 2019-02-17 LAB — GLUCOSE, CAPILLARY
Glucose-Capillary: 123 mg/dL — ABNORMAL HIGH (ref 70–99)
Glucose-Capillary: 118 mg/dL — ABNORMAL HIGH (ref 70–99)
Glucose-Capillary: 140 mg/dL — ABNORMAL HIGH (ref 70–99)
Glucose-Capillary: 132 mg/dL — ABNORMAL HIGH (ref 70–99)
Glucose-Capillary: 117 mg/dL — ABNORMAL HIGH (ref 70–99)

## 2019-02-17 LAB — CBC
HCT: 25 % — ABNORMAL LOW (ref 39.0–52.0)
Hemoglobin: 8.2 g/dL — ABNORMAL LOW (ref 13.0–17.0)
MCH: 31.1 pg (ref 26.0–34.0)
MCHC: 32.8 g/dL (ref 30.0–36.0)
MCV: 94.7 fL (ref 80.0–100.0)
Platelets: 166 10*3/uL (ref 150–400)
RBC: 2.64 MIL/uL — ABNORMAL LOW (ref 4.22–5.81)
RDW: 12.5 % (ref 11.5–15.5)
WBC: 8.7 10*3/uL (ref 4.0–10.5)
nRBC: 0 % (ref 0.0–0.2)

## 2019-02-17 MED ORDER — QUETIAPINE FUMARATE 50 MG PO TABS
25.0000 mg | ORAL_TABLET | Freq: Two times a day (BID) | ORAL | Status: DC
  Administered 2019-02-17 – 2019-02-18 (×4): 25 mg
  Filled 2019-02-17 (×4): qty 1

## 2019-02-17 MED ORDER — CLONIDINE HCL 0.1 MG PO TABS
0.2000 mg | ORAL_TABLET | Freq: Four times a day (QID) | ORAL | Status: AC
  Administered 2019-02-18 – 2019-02-19 (×4): 0.2 mg
  Filled 2019-02-17 (×4): qty 2

## 2019-02-17 MED ORDER — LORAZEPAM 2 MG/ML IJ SOLN
2.0000 mg | Freq: Four times a day (QID) | INTRAMUSCULAR | Status: DC | PRN
  Administered 2019-02-18: 2 mg via INTRAVENOUS
  Filled 2019-02-17: qty 1

## 2019-02-17 MED ORDER — CLONIDINE HCL 0.1 MG PO TABS
0.3000 mg | ORAL_TABLET | Freq: Four times a day (QID) | ORAL | Status: AC
  Administered 2019-02-17 – 2019-02-18 (×4): 0.3 mg
  Filled 2019-02-17: qty 3
  Filled 2019-02-17: qty 1
  Filled 2019-02-17: qty 3
  Filled 2019-02-17: qty 1
  Filled 2019-02-17: qty 3

## 2019-02-17 MED ORDER — CLONIDINE HCL 0.1 MG PO TABS
0.1000 mg | ORAL_TABLET | Freq: Every day | ORAL | Status: AC
  Administered 2019-02-21: 10:00:00 0.1 mg
  Filled 2019-02-17: qty 1

## 2019-02-17 MED ORDER — CLONAZEPAM 0.25 MG PO TBDP
0.5000 mg | ORAL_TABLET | Freq: Every day | ORAL | Status: DC
  Administered 2019-02-17 – 2019-02-18 (×2): 0.5 mg
  Filled 2019-02-17 (×2): qty 2

## 2019-02-17 MED ORDER — PIVOT 1.5 CAL PO LIQD
1000.0000 mL | ORAL | Status: DC
  Administered 2019-02-17 – 2019-02-22 (×5): 1000 mL
  Filled 2019-02-17 (×12): qty 1000

## 2019-02-17 MED ORDER — MAGNESIUM CITRATE PO SOLN
0.5000 | Freq: Once | ORAL | Status: AC
  Administered 2019-02-17: 08:00:00 0.5
  Filled 2019-02-17 (×2): qty 296

## 2019-02-17 MED ORDER — CLONIDINE HCL 0.1 MG PO TABS
0.1000 mg | ORAL_TABLET | Freq: Four times a day (QID) | ORAL | Status: AC
  Administered 2019-02-19 – 2019-02-20 (×4): 0.1 mg
  Filled 2019-02-17 (×4): qty 1

## 2019-02-17 MED ORDER — CLONIDINE HCL 0.1 MG PO TABS
0.1000 mg | ORAL_TABLET | Freq: Two times a day (BID) | ORAL | Status: AC
  Administered 2019-02-20 (×2): 0.1 mg
  Filled 2019-02-17 (×2): qty 1

## 2019-02-17 NOTE — Progress Notes (Signed)
SLP Cancellation Note  Patient Details Name: Jose [name] MRN: [id number] DOB: 22-Aug-1961   Cancelled treatment:       Reason Eval/Treat Not Completed: Other (comment) Pt being transferred. Will f/u as able.   Virl Axe Merril Nagy 02/17/2019, 9:34 AM   Ivar Drape, M.A. CCC-SLP Acute Herbalist [phone] Office [phone]

## 2019-02-17 NOTE — Progress Notes (Signed)
CSW placed call to patient's brother Dorinda Hill at [phone] with no answer and no voicemail option. CSW texted patient to notify him of CSW's attempt to reach him.   Edwin Dada, MSW, LCSW-A Clinical Social Worker [facility] [phone]
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
## 2019-02-18 LAB — GLUCOSE, CAPILLARY
Glucose-Capillary: 112 mg/dL — ABNORMAL HIGH (ref 70–99)
Glucose-Capillary: 112 mg/dL — ABNORMAL HIGH (ref 70–99)
Glucose-Capillary: 114 mg/dL — ABNORMAL HIGH (ref 70–99)
Glucose-Capillary: 122 mg/dL — ABNORMAL HIGH (ref 70–99)
Glucose-Capillary: 147 mg/dL — ABNORMAL HIGH (ref 70–99)
Glucose-Capillary: 99 mg/dL (ref 70–99)

## 2019-02-18 MED ORDER — GUAIFENESIN 100 MG/5ML PO SOLN
5.0000 mL | Freq: Three times a day (TID) | ORAL | Status: DC
  Administered 2019-02-18 – 2019-02-19 (×4): 100 mg
  Filled 2019-02-18 (×4): qty 10

## 2019-02-18 MED ORDER — LORAZEPAM 2 MG/ML IJ SOLN
2.0000 mg | INTRAMUSCULAR | Status: DC | PRN
  Administered 2019-02-18 – 2019-02-20 (×7): 2 mg via INTRAVENOUS
  Filled 2019-02-18 (×8): qty 1
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
## 2019-02-18 NOTE — Progress Notes (Signed)
Patient very agitated, seems confused and scared of RN as if patient does not know location in hospital. Mouthing gibberish. Patient given 2mg  IV Ativan. Dr. Luisa Hart paged and received orders for bilateral wrist restraints and posey belt. Order to increase IV Ativan 2mg  from Q6 to Q2. Thanks Dr. Luisa Hart!  Lin Landsman, RN

## 2019-02-19 LAB — GLUCOSE, CAPILLARY
Glucose-Capillary: 109 mg/dL — ABNORMAL HIGH (ref 70–99)
Glucose-Capillary: 118 mg/dL — ABNORMAL HIGH (ref 70–99)
Glucose-Capillary: 119 mg/dL — ABNORMAL HIGH (ref 70–99)
Glucose-Capillary: 123 mg/dL — ABNORMAL HIGH (ref 70–99)
Glucose-Capillary: 126 mg/dL — ABNORMAL HIGH (ref 70–99)
Glucose-Capillary: 128 mg/dL — ABNORMAL HIGH (ref 70–99)

## 2019-02-19 LAB — BASIC METABOLIC PANEL
Anion gap: 6 (ref 5–15)
BUN: 16 mg/dL (ref 6–20)
CO2: 25 mmol/L (ref 22–32)
Calcium: 7.8 mg/dL — ABNORMAL LOW (ref 8.9–10.3)
Chloride: 99 mmol/L (ref 98–111)
Creatinine, Ser: 0.75 mg/dL (ref 0.61–1.24)
GFR calc Af Amer: >60 mL/min (ref >60)
GFR calc non Af Amer: >60 mL/min (ref >60)
Glucose, Bld: 105 mg/dL — ABNORMAL HIGH (ref 70–99)
Potassium: 6.9 mmol/L (ref 3.5–5.1)
Sodium: 130 mmol/L — ABNORMAL LOW (ref 135–145)

## 2019-02-19 LAB — POTASSIUM: Potassium: 4.1 mmol/L (ref 3.5–5.1)

## 2019-02-19 MED ORDER — FREE WATER
100.0000 mL | Freq: Four times a day (QID) | Status: DC
  Administered 2019-02-19 – 2019-02-20 (×4): 100 mL

## 2019-02-19 MED ORDER — GUAIFENESIN 100 MG/5ML PO SOLN
5.0000 mL | Freq: Four times a day (QID) | ORAL | Status: DC
  Administered 2019-02-19 – 2019-02-24 (×19): 100 mg
  Filled 2019-02-19 (×5): qty 10
  Filled 2019-02-19: qty 20
  Filled 2019-02-19 (×2): qty 10
  Filled 2019-02-19: qty 100
  Filled 2019-02-19 (×7): qty 10
  Filled 2019-02-19: qty 50
  Filled 2019-02-19: qty 10

## 2019-02-19 MED ORDER — METOPROLOL TARTRATE 12.5 MG HALF TABLET
12.5000 mg | ORAL_TABLET | Freq: Two times a day (BID) | ORAL | Status: DC
  Administered 2019-02-19 (×2): 12.5 mg
  Filled 2019-02-19 (×2): qty 1

## 2019-02-19 MED ORDER — METOPROLOL TARTRATE 12.5 MG HALF TABLET: 12.5000 mg | ORAL_TABLET | Freq: Two times a day (BID) | ORAL | Status: DC

## 2019-02-19 MED ORDER — CLONAZEPAM 0.25 MG PO TBDP
1.0000 mg | ORAL_TABLET | Freq: Every day | ORAL | Status: DC
  Administered 2019-02-19 – 2019-03-03 (×12): 1 mg
  Filled 2019-02-19 (×13): qty 4

## 2019-02-19 MED ORDER — QUETIAPINE FUMARATE 50 MG PO TABS
50.0000 mg | ORAL_TABLET | Freq: Two times a day (BID) | ORAL | Status: DC
  Administered 2019-02-19 – 2019-02-26 (×15): 50 mg
  Filled 2019-02-19 (×16): qty 1
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
## 2019-02-19 NOTE — Progress Notes (Signed)
CRITICAL VALUE ALERT  Critical Value:  K- 6.9  Date & Time Notied:  02/19/19 0900  Provider Notified: Dr. Janee Morn  Orders Received/Actions taken: repeat lab order
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
## 2019-02-19 NOTE — Progress Notes (Signed)
Trach care done per RRT. Site was soiled and oozing secretions. Site was cleansed with NS and sterile water, dried, and clean dressing applied. IC was removed and replaced with a clean one, reinserted and locked back into place. Patient tolerated it well no distress or complications noted.

## 2019-02-19 NOTE — Progress Notes (Signed)
Trach sutures removed per order by Dr. Leonard Schwartz. Jose [name].
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
## 2019-02-20 LAB — BASIC METABOLIC PANEL WITH GFR
Anion gap: 11 (ref 5–15)
BUN: 19 mg/dL (ref 6–20)
CO2: 28 mmol/L (ref 22–32)
Calcium: 9.6 mg/dL (ref 8.9–10.3)
Chloride: 98 mmol/L (ref 98–111)
Creatinine, Ser: 0.73 mg/dL (ref 0.61–1.24)
GFR calc Af Amer: >60 mL/min
GFR calc non Af Amer: >60 mL/min
Glucose, Bld: 113 mg/dL — ABNORMAL HIGH (ref 70–99)
Potassium: 4.2 mmol/L (ref 3.5–5.1)
Sodium: 137 mmol/L (ref 135–145)

## 2019-02-20 LAB — GLUCOSE, CAPILLARY
Glucose-Capillary: 102 mg/dL — ABNORMAL HIGH (ref 70–99)
Glucose-Capillary: 112 mg/dL — ABNORMAL HIGH (ref 70–99)
Glucose-Capillary: 113 mg/dL — ABNORMAL HIGH (ref 70–99)
Glucose-Capillary: 114 mg/dL — ABNORMAL HIGH (ref 70–99)
Glucose-Capillary: 119 mg/dL — ABNORMAL HIGH (ref 70–99)
Glucose-Capillary: 130 mg/dL — ABNORMAL HIGH (ref 70–99)

## 2019-02-20 MED ORDER — METOPROLOL TARTRATE 25 MG PO TABS
25.0000 mg | ORAL_TABLET | Freq: Two times a day (BID) | ORAL | Status: DC
  Administered 2019-02-20 – 2019-03-03 (×23): 25 mg
  Filled 2019-02-20 (×23): qty 1

## 2019-02-20 MED ORDER — LORAZEPAM 2 MG/ML IJ SOLN
1.0000 mg | INTRAMUSCULAR | Status: DC | PRN
  Administered 2019-02-21 – 2019-02-25 (×8): 2 mg via INTRAVENOUS
  Filled 2019-02-20 (×8): qty 1

## 2019-02-20 MED ORDER — FREE WATER
100.0000 mL | Freq: Three times a day (TID) | Status: DC
  Administered 2019-02-20 – 2019-03-10 (×53): 100 mL

## 2019-02-20 MED ORDER — AMLODIPINE BESYLATE 5 MG PO TABS
5.0000 mg | ORAL_TABLET | Freq: Every day | ORAL | Status: DC
  Administered 2019-02-20 – 2019-03-01 (×10): 5 mg via ORAL
  Filled 2019-02-20 (×10): qty 1
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
## 2019-02-20 NOTE — Procedures (Signed)
Tracheostomy Change Note  Patient Details:   Name: Jose [name] DOB: 04/09/1961 MRN: [id number]    Airway Documentation:   Change trach to #4 cuffless Shiley per physician order  Evaluation  O2 sats: stable throughout Complications: No apparent complications Patient did tolerate procedure well. Bilateral Breath Sounds: Rhonchi    Audrie Lia 02/20/2019, 10:59 AM
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
## 2019-02-20 NOTE — Plan of Care (Signed)
Progressing on all goals at this time.  
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
## 2019-02-21 ENCOUNTER — Other Ambulatory Visit: Payer: Self-pay

## 2019-02-21 LAB — CBC
HCT: 28 % — ABNORMAL LOW (ref 39.0–52.0)
Hemoglobin: 9.5 g/dL — ABNORMAL LOW (ref 13.0–17.0)
MCH: 31.4 pg (ref 26.0–34.0)
MCHC: 33.9 g/dL (ref 30.0–36.0)
MCV: 92.4 fL (ref 80.0–100.0)
Platelets: 234 10*3/uL (ref 150–400)
RBC: 3.03 MIL/uL — ABNORMAL LOW (ref 4.22–5.81)
RDW: 12.4 % (ref 11.5–15.5)
WBC: 12.4 10*3/uL — ABNORMAL HIGH (ref 4.0–10.5)
nRBC: 0 % (ref 0.0–0.2)

## 2019-02-21 LAB — BASIC METABOLIC PANEL
Anion gap: 13 (ref 5–15)
BUN: 20 mg/dL (ref 6–20)
CO2: 24 mmol/L (ref 22–32)
Calcium: 9.3 mg/dL (ref 8.9–10.3)
Chloride: 97 mmol/L — ABNORMAL LOW (ref 98–111)
Creatinine, Ser: 0.81 mg/dL (ref 0.61–1.24)
GFR calc Af Amer: >60 mL/min (ref >60)
GFR calc non Af Amer: >60 mL/min (ref >60)
Glucose, Bld: 138 mg/dL — ABNORMAL HIGH (ref 70–99)
Potassium: 3.6 mmol/L (ref 3.5–5.1)
Sodium: 134 mmol/L — ABNORMAL LOW (ref 135–145)

## 2019-02-21 LAB — GLUCOSE, CAPILLARY
Glucose-Capillary: 104 mg/dL — ABNORMAL HIGH (ref 70–99)
Glucose-Capillary: 116 mg/dL — ABNORMAL HIGH (ref 70–99)
Glucose-Capillary: 118 mg/dL — ABNORMAL HIGH (ref 70–99)
Glucose-Capillary: 119 mg/dL — ABNORMAL HIGH (ref 70–99)
Glucose-Capillary: 130 mg/dL — ABNORMAL HIGH (ref 70–99)
Glucose-Capillary: 134 mg/dL — ABNORMAL HIGH (ref 70–99)

## 2019-02-22 ENCOUNTER — Inpatient Hospital Stay (HOSPITAL_COMMUNITY): Payer: Medicaid Other

## 2019-02-22 LAB — GLUCOSE, CAPILLARY
Glucose-Capillary: 116 mg/dL — ABNORMAL HIGH (ref 70–99)
Glucose-Capillary: 132 mg/dL — ABNORMAL HIGH (ref 70–99)
Glucose-Capillary: 101 mg/dL — ABNORMAL HIGH (ref 70–99)
Glucose-Capillary: 126 mg/dL — ABNORMAL HIGH (ref 70–99)
Glucose-Capillary: 119 mg/dL — ABNORMAL HIGH (ref 70–99)
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
## 2019-02-23 LAB — URINALYSIS, ROUTINE W REFLEX MICROSCOPIC
Bilirubin Urine: NEGATIVE
Glucose, UA: NEGATIVE mg/dL
Ketones, ur: NEGATIVE mg/dL
Nitrite: NEGATIVE
Protein, ur: NEGATIVE mg/dL
Specific Gravity, Urine: 1.008 (ref 1.005–1.030)
pH: 6 (ref 5.0–8.0)

## 2019-02-23 LAB — BASIC METABOLIC PANEL
Anion gap: 12 (ref 5–15)
BUN: 27 mg/dL — ABNORMAL HIGH (ref 6–20)
CO2: 26 mmol/L (ref 22–32)
Calcium: 9.4 mg/dL (ref 8.9–10.3)
Chloride: 96 mmol/L — ABNORMAL LOW (ref 98–111)
Creatinine, Ser: 0.85 mg/dL (ref 0.61–1.24)
GFR calc Af Amer: >60 mL/min (ref >60)
GFR calc non Af Amer: >60 mL/min (ref >60)
Glucose, Bld: 136 mg/dL — ABNORMAL HIGH (ref 70–99)
Potassium: 3.8 mmol/L (ref 3.5–5.1)
Sodium: 134 mmol/L — ABNORMAL LOW (ref 135–145)

## 2019-02-23 LAB — CBC
HCT: 26.8 % — ABNORMAL LOW (ref 39.0–52.0)
Hemoglobin: 8.8 g/dL — ABNORMAL LOW (ref 13.0–17.0)
MCH: 30.8 pg (ref 26.0–34.0)
MCHC: 32.8 g/dL (ref 30.0–36.0)
MCV: 93.7 fL (ref 80.0–100.0)
Platelets: 222 10*3/uL (ref 150–400)
RBC: 2.86 MIL/uL — ABNORMAL LOW (ref 4.22–5.81)
RDW: 12.5 % (ref 11.5–15.5)
WBC: 6.5 10*3/uL (ref 4.0–10.5)
nRBC: 0 % (ref 0.0–0.2)

## 2019-02-23 LAB — HEPATIC FUNCTION PANEL
ALT: 139 U/L — ABNORMAL HIGH (ref 0–44)
AST: 114 U/L — ABNORMAL HIGH (ref 15–41)
Albumin: 2.8 g/dL — ABNORMAL LOW (ref 3.5–5.0)
Alkaline Phosphatase: 105 U/L (ref 38–126)
Bilirubin, Direct: 0.1 mg/dL (ref 0.0–0.2)
Indirect Bilirubin: 0.3 mg/dL (ref 0.3–0.9)
Total Bilirubin: 0.4 mg/dL (ref 0.3–1.2)
Total Protein: 7.5 g/dL (ref 6.5–8.1)

## 2019-02-23 LAB — GLUCOSE, CAPILLARY
Glucose-Capillary: 109 mg/dL — ABNORMAL HIGH (ref 70–99)
Glucose-Capillary: 129 mg/dL — ABNORMAL HIGH (ref 70–99)
Glucose-Capillary: 109 mg/dL — ABNORMAL HIGH (ref 70–99)
Glucose-Capillary: 116 mg/dL — ABNORMAL HIGH (ref 70–99)
Glucose-Capillary: 121 mg/dL — ABNORMAL HIGH (ref 70–99)

## 2019-02-23 MED ORDER — SODIUM CHLORIDE 0.9 % IV SOLN
2.0000 g | Freq: Three times a day (TID) | INTRAVENOUS | Status: AC
  Administered 2019-02-23 – 2019-03-01 (×20): 2 g via INTRAVENOUS
  Filled 2019-02-23 (×20): qty 2
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
## 2019-02-23 NOTE — Progress Notes (Signed)
Patient has fever and has been coughing up excessive amounts of mucous since last week.
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
## 2019-02-24 ENCOUNTER — Inpatient Hospital Stay (HOSPITAL_COMMUNITY): Payer: Medicaid Other

## 2019-02-24 LAB — COMPREHENSIVE METABOLIC PANEL
ALT: 193 U/L — ABNORMAL HIGH (ref 0–44)
AST: 151 U/L — ABNORMAL HIGH (ref 15–41)
Albumin: 2.8 g/dL — ABNORMAL LOW (ref 3.5–5.0)
Alkaline Phosphatase: 110 U/L (ref 38–126)
Anion gap: 12 (ref 5–15)
BUN: 28 mg/dL — ABNORMAL HIGH (ref 6–20)
CO2: 26 mmol/L (ref 22–32)
Calcium: 9.4 mg/dL (ref 8.9–10.3)
Chloride: 99 mmol/L (ref 98–111)
Creatinine, Ser: 0.72 mg/dL (ref 0.61–1.24)
GFR calc Af Amer: >60 mL/min (ref >60)
GFR calc non Af Amer: >60 mL/min (ref >60)
Glucose, Bld: 114 mg/dL — ABNORMAL HIGH (ref 70–99)
Potassium: 4 mmol/L (ref 3.5–5.1)
Sodium: 137 mmol/L (ref 135–145)
Total Bilirubin: 0.5 mg/dL (ref 0.3–1.2)
Total Protein: 7.1 g/dL (ref 6.5–8.1)

## 2019-02-24 LAB — CBC
HCT: 27.6 % — ABNORMAL LOW (ref 39.0–52.0)
Hemoglobin: 9 g/dL — ABNORMAL LOW (ref 13.0–17.0)
MCH: 30.4 pg (ref 26.0–34.0)
MCHC: 32.6 g/dL (ref 30.0–36.0)
MCV: 93.2 fL (ref 80.0–100.0)
Platelets: 236 10*3/uL (ref 150–400)
RBC: 2.96 MIL/uL — ABNORMAL LOW (ref 4.22–5.81)
RDW: 12.4 % (ref 11.5–15.5)
WBC: 6.7 10*3/uL (ref 4.0–10.5)
nRBC: 0 % (ref 0.0–0.2)

## 2019-02-24 LAB — GLUCOSE, CAPILLARY
Glucose-Capillary: 102 mg/dL — ABNORMAL HIGH (ref 70–99)
Glucose-Capillary: 103 mg/dL — ABNORMAL HIGH (ref 70–99)
Glucose-Capillary: 105 mg/dL — ABNORMAL HIGH (ref 70–99)
Glucose-Capillary: 111 mg/dL — ABNORMAL HIGH (ref 70–99)
Glucose-Capillary: 113 mg/dL — ABNORMAL HIGH (ref 70–99)
Glucose-Capillary: 130 mg/dL — ABNORMAL HIGH (ref 70–99)

## 2019-02-24 MED ORDER — OSMOLITE 1.5 CAL PO LIQD
1000.0000 mL | ORAL | Status: DC
  Administered 2019-02-24 – 2019-02-28 (×4): 1000 mL
  Filled 2019-02-24 (×9): qty 1000

## 2019-02-24 MED ORDER — PRO-STAT SUGAR FREE PO LIQD
30.0000 mL | Freq: Three times a day (TID) | ORAL | Status: DC
  Administered 2019-02-24 – 2019-03-03 (×17): 30 mL
  Filled 2019-02-24 (×17): qty 30

## 2019-02-24 MED ORDER — GUAIFENESIN 100 MG/5ML PO SOLN
10.0000 mL | Freq: Four times a day (QID) | ORAL | Status: DC
  Administered 2019-02-24 – 2019-03-07 (×38): 200 mg
  Filled 2019-02-24 (×3): qty 20
  Filled 2019-02-24 (×7): qty 10
  Filled 2019-02-24: qty 20
  Filled 2019-02-24: qty 10
  Filled 2019-02-24: qty 20
  Filled 2019-02-24: qty 10
  Filled 2019-02-24: qty 20
  Filled 2019-02-24: qty 10
  Filled 2019-02-24 (×4): qty 20
  Filled 2019-02-24: qty 10
  Filled 2019-02-24: qty 20
  Filled 2019-02-24: qty 10
  Filled 2019-02-24: qty 20
  Filled 2019-02-24 (×2): qty 10
  Filled 2019-02-24: qty 20
  Filled 2019-02-24 (×4): qty 10
  Filled 2019-02-24: qty 20
  Filled 2019-02-24 (×6): qty 10

## 2019-02-24 MED ORDER — SODIUM CHLORIDE 0.9% FLUSH
10.0000 mL | INTRAVENOUS | Status: DC | PRN
  Administered 2019-03-17: 10 mL
  Filled 2019-02-24: qty 40
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
## 2019-02-25 ENCOUNTER — Inpatient Hospital Stay (HOSPITAL_COMMUNITY): Payer: Medicaid Other

## 2019-02-25 ENCOUNTER — Other Ambulatory Visit: Payer: Self-pay

## 2019-02-25 LAB — URINE CULTURE: Culture: >=100000 — AB

## 2019-02-25 LAB — BASIC METABOLIC PANEL
Anion gap: 10 (ref 5–15)
BUN: 26 mg/dL — ABNORMAL HIGH (ref 6–20)
CO2: 27 mmol/L (ref 22–32)
Calcium: 9.7 mg/dL (ref 8.9–10.3)
Chloride: 100 mmol/L (ref 98–111)
Creatinine, Ser: 0.72 mg/dL (ref 0.61–1.24)
GFR calc Af Amer: >60 mL/min (ref >60)
GFR calc non Af Amer: >60 mL/min (ref >60)
Glucose, Bld: 142 mg/dL — ABNORMAL HIGH (ref 70–99)
Potassium: 3.9 mmol/L (ref 3.5–5.1)
Sodium: 137 mmol/L (ref 135–145)

## 2019-02-25 LAB — GLUCOSE, CAPILLARY
Glucose-Capillary: 101 mg/dL — ABNORMAL HIGH (ref 70–99)
Glucose-Capillary: 104 mg/dL — ABNORMAL HIGH (ref 70–99)
Glucose-Capillary: 105 mg/dL — ABNORMAL HIGH (ref 70–99)
Glucose-Capillary: 106 mg/dL — ABNORMAL HIGH (ref 70–99)
Glucose-Capillary: 128 mg/dL — ABNORMAL HIGH (ref 70–99)
Glucose-Capillary: 82 mg/dL (ref 70–99)
Glucose-Capillary: 99 mg/dL (ref 70–99)

## 2019-02-25 MED ORDER — LORAZEPAM 2 MG/ML IJ SOLN
1.0000 mg | Freq: Three times a day (TID) | INTRAMUSCULAR | Status: DC | PRN
  Administered 2019-02-26 (×2): 2 mg via INTRAVENOUS
  Administered 2019-02-27: 1 mg via INTRAVENOUS
  Administered 2019-02-28 (×2): 2 mg via INTRAVENOUS
  Filled 2019-02-25 (×6): qty 1

## 2019-02-25 MED ORDER — LIDOCAINE VISCOUS HCL 2 % MT SOLN
5.0000 mL | Freq: Once | OROMUCOSAL | Status: AC
  Administered 2019-02-25 (×2): 5 mL via OROMUCOSAL

## 2019-02-25 MED ORDER — IOHEXOL 300 MG/ML  SOLN
25.0000 mL | Freq: Once | INTRAMUSCULAR | Status: AC | PRN
  Administered 2019-02-25: 25 mL via ORAL

## 2019-02-25 NOTE — Progress Notes (Signed)
OT Cancellation Note  Patient Details Name: KARON BRACEY MRN: [id number] DOB: 11-19-1960   Cancelled Treatment:    Reason Eval/Treat Not Completed: Fatigue/lethargy limiting ability to participate. OT will continue to follow acutely.  Evern Bio Aleksi Brummet 02/25/2019, 5:00 PM   Sherryl Manges OTR/L Acute Rehabilitation Services Pager: [phone] Office: [phone]
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
## 2019-02-26 ENCOUNTER — Inpatient Hospital Stay (HOSPITAL_COMMUNITY): Payer: Medicaid Other

## 2019-02-26 LAB — GLUCOSE, CAPILLARY
Glucose-Capillary: 102 mg/dL — ABNORMAL HIGH (ref 70–99)
Glucose-Capillary: 97 mg/dL (ref 70–99)
Glucose-Capillary: 110 mg/dL — ABNORMAL HIGH (ref 70–99)
Glucose-Capillary: 100 mg/dL — ABNORMAL HIGH (ref 70–99)
Glucose-Capillary: 135 mg/dL — ABNORMAL HIGH (ref 70–99)
Glucose-Capillary: 106 mg/dL — ABNORMAL HIGH (ref 70–99)

## 2019-02-26 LAB — CULTURE, RESPIRATORY W GRAM STAIN

## 2019-02-26 MED ORDER — HALOPERIDOL LACTATE 5 MG/ML IJ SOLN
2.0000 mg | Freq: Once | INTRAMUSCULAR | Status: AC
  Administered 2019-02-26: 2 mg via INTRAVENOUS
  Filled 2019-02-26: qty 1
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
## 2019-02-26 NOTE — Plan of Care (Signed)
  Problem: Health Behavior/Discharge Planning: Goal: Ability to manage health-related needs will improve Outcome: Progressing   Problem: Clinical Measurements: Goal: Ability to maintain clinical measurements within normal limits will improve Outcome: Progressing Goal: Will remain free from infection Outcome: Progressing Goal: Diagnostic test results will improve Outcome: Progressing Goal: Respiratory complications will improve Outcome: Progressing Goal: Cardiovascular complication will be avoided Outcome: Progressing   Problem: Activity: Goal: Risk for activity intolerance will decrease Outcome: Progressing   Problem: Nutrition: Goal: Adequate nutrition will be maintained Outcome: Progressing   Problem: Coping: Goal: Level of anxiety will decrease Outcome: Progressing   Problem: Elimination: Goal: Will not experience complications related to bowel motility Outcome: Progressing Goal: Will not experience complications related to urinary retention Outcome: Progressing   Problem: Pain Managment: Goal: General experience of comfort will improve Outcome: Progressing   Problem: Safety: Goal: Ability to remain free from injury will improve Outcome: Progressing   Problem: Skin Integrity: Goal: Risk for impaired skin integrity will decrease Outcome: Progressing   Problem: Activity: Goal: Ability to perform activities at highest level will improve Outcome: Progressing Goal: Ability to avoid complications of mobility impairment will improve Outcome: Progressing Goal: Ability to tolerate increased activity will improve Outcome: Progressing Goal: Will remain free from falls Outcome: Progressing   Problem: Respiratory: Goal: Ability to maintain adequate ventilation will improve Outcome: Progressing   Problem: Tissue Perfusion: Goal: Hemodynamically stable with effective tissue perfusion will improve Outcome: Progressing Goal: Postoperative complications will be avoided  or minimized Outcome: Progressing   Problem: Skin Integrity: Goal: Ability to maintain adequate tissue integrity will improve Outcome: Progressing   Problem: Infection: Goal: Risk for infection will decrease (spleen) Outcome: Progressing   Problem: Bowel/Gastric: Goal: Gastrointestinal status for postoperative course will improve Outcome: Progressing Goal: GI tract motility and GI tissue perfusion will improve Outcome: Progressing Goal: Ability to demonstrate the techniques of an individualized bowel program will improve Outcome: Progressing   Problem: Urinary Elimination: Goal: Ability to achieve a regular elimination pattern will improve Outcome: Progressing   Problem: Fluid Volume: Goal: Return to normovolemic state will improve Outcome: Progressing   Problem: Metabolic: Goal: Ability to maintain a metabolic balance will improve Outcome: Progressing   Problem: Coping: Goal: Exhibits appropriate coping mechanism and reduced anxiety resulting from physical and emotional stress Outcome: Progressing   Problem: Self-Concept: Goal: Ability to acknowledge body changes will improve Outcome: Progressing   

## 2019-02-27 ENCOUNTER — Inpatient Hospital Stay (HOSPITAL_COMMUNITY): Payer: Medicaid Other

## 2019-02-27 LAB — GLUCOSE, CAPILLARY
Glucose-Capillary: 107 mg/dL — ABNORMAL HIGH (ref 70–99)
Glucose-Capillary: 109 mg/dL — ABNORMAL HIGH (ref 70–99)
Glucose-Capillary: 109 mg/dL — ABNORMAL HIGH (ref 70–99)
Glucose-Capillary: 110 mg/dL — ABNORMAL HIGH (ref 70–99)
Glucose-Capillary: 112 mg/dL — ABNORMAL HIGH (ref 70–99)
Glucose-Capillary: 90 mg/dL (ref 70–99)

## 2019-02-27 LAB — CBC
HCT: 28.2 % — ABNORMAL LOW (ref 39.0–52.0)
Hemoglobin: 9.5 g/dL — ABNORMAL LOW (ref 13.0–17.0)
MCH: 30.9 pg (ref 26.0–34.0)
MCHC: 33.7 g/dL (ref 30.0–36.0)
MCV: 91.9 fL (ref 80.0–100.0)
Platelets: 323 10*3/uL (ref 150–400)
RBC: 3.07 MIL/uL — ABNORMAL LOW (ref 4.22–5.81)
RDW: 12.1 % (ref 11.5–15.5)
WBC: 9.7 10*3/uL (ref 4.0–10.5)
nRBC: 0 % (ref 0.0–0.2)

## 2019-02-27 LAB — BASIC METABOLIC PANEL
Anion gap: 13 (ref 5–15)
BUN: 20 mg/dL (ref 6–20)
CO2: 25 mmol/L (ref 22–32)
Calcium: 10.2 mg/dL (ref 8.9–10.3)
Chloride: 100 mmol/L (ref 98–111)
Creatinine, Ser: 0.78 mg/dL (ref 0.61–1.24)
GFR calc Af Amer: >60 mL/min (ref >60)
GFR calc non Af Amer: >60 mL/min (ref >60)
Glucose, Bld: 118 mg/dL — ABNORMAL HIGH (ref 70–99)
Potassium: 3.6 mmol/L (ref 3.5–5.1)
Sodium: 138 mmol/L (ref 135–145)

## 2019-02-27 MED ORDER — QUETIAPINE FUMARATE 100 MG PO TABS
100.0000 mg | ORAL_TABLET | Freq: Two times a day (BID) | ORAL | Status: DC
  Administered 2019-02-27 – 2019-03-03 (×10): 100 mg
  Filled 2019-02-27 (×10): qty 1

## 2019-02-27 MED ORDER — HALOPERIDOL LACTATE 5 MG/ML IJ SOLN
2.0000 mg | Freq: Once | INTRAMUSCULAR | Status: AC
  Administered 2019-02-27: 2 mg via INTRAVENOUS
  Filled 2019-02-27: qty 1

## 2019-02-27 NOTE — Progress Notes (Signed)
Modified Barium Swallow Progress Note  Patient Details  Name: Jose [name] MRN: [id number] Date of Birth: [birth year]  Today's Date: 02/27/2019  Modified Barium Swallow completed.  Full report located under Chart Review in the Imaging Section.  Brief recommendations include the following:  Clinical Impression  [name] demonstrated severe oropharyngeal dysphagia leading to + penetration and aspiration with puree and honey thick consistencies. Oral impairments were marked by reduced lingual to soft palate contact, reduced tongue base retraction and incomplete velopharyngeal closure leading to delayed transit and brief regurgitation into nasopharynx. Efforts to initiate a pharynx swallow were non functional. He could not contact tongue base to pharyx to move bolus into pharynx;  minimal hyoid elevation did not allow any epiglottic movement and anterior hyoid movement was non existant therefore UES could not open. Pt sensed material on vocal cords during initial episode only with reflexive throat clear however not clearing vestibule. Bolus in phayrnx fell into pharyx and was aspirated with increasing amounts with limited trials given. SLP utilized the [place] and cues for pt to cough in attempts to clear valleculae and pyriform sinuses. Pt should remain NPO with longer term alternate means of nutrition and continued ST.        Swallow Evaluation Recommendations       SLP Diet Recommendations: NPO       Medication Administration: Via alternative means               Oral Care Recommendations: Oral care QID        Royce Macadamia 02/27/2019,2:12 PM   Breck Coons Blanket.Ed Nurse, children's [phone] Office [phone]

## 2019-02-27 NOTE — Progress Notes (Addendum)
RT called to bedside.  Pt had pulled out Trach.  RT replaced trach.  BBS equal, SPO2 99%, ETCO2 had good color change.  Pt tolerated well.  RT to monitor
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
## 2019-02-27 NOTE — Progress Notes (Signed)
  Speech Language Pathology  Patient Details Name: ANTONI WHITMILL MRN: [id number] DOB: [birth year] Today's Date: 02/27/2019 Time:  -      MBS today at 11:30               Royce Macadamia 02/27/2019, 8:49 AM  Breck Coons Lonell Face.Ed Nurse, children's [phone] Office [phone]

## 2019-02-27 NOTE — Procedures (Signed)
Cortrak  Person Inserting Tube:  Markelle Najarian, RD Tube Type:  Cortrak - 43 inches Tube Location:  Right nare Initial Placement:  Stomach Secured by: Bridle Technique Used to Measure Tube Placement:  Documented cm marking at nare/ corner of mouth Cortrak Secured At:  65 cm   No x-ray is required. RN may begin using tube.   If the tube becomes dislodged please keep the tube and contact the Cortrak team at [email or web address] (password [email or web address]) for replacement.  If after hours and replacement cannot be delayed, place a NG tube and confirm placement with an abdominal x-ray.   Raelene Trew RD, LDN Clinical Nutrition Pager # - [phone]    

## 2019-02-28 LAB — CULTURE, BLOOD (ROUTINE X 2)
Culture: NO GROWTH
Culture: NO GROWTH
Special Requests: ADEQUATE
Special Requests: ADEQUATE

## 2019-02-28 LAB — GLUCOSE, CAPILLARY
Glucose-Capillary: 88 mg/dL (ref 70–99)
Glucose-Capillary: 89 mg/dL (ref 70–99)
Glucose-Capillary: 104 mg/dL — ABNORMAL HIGH (ref 70–99)
Glucose-Capillary: 88 mg/dL (ref 70–99)
Glucose-Capillary: 120 mg/dL — ABNORMAL HIGH (ref 70–99)
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
## 2019-02-28 NOTE — Plan of Care (Signed)
  Problem: Health Behavior/Discharge Planning: Goal: Ability to manage health-related needs will improve Outcome: Progressing   Problem: Clinical Measurements: Goal: Ability to maintain clinical measurements within normal limits will improve Outcome: Progressing Goal: Will remain free from infection Outcome: Progressing Goal: Diagnostic test results will improve Outcome: Progressing Goal: Respiratory complications will improve Outcome: Progressing Goal: Cardiovascular complication will be avoided Outcome: Progressing   Problem: Activity: Goal: Risk for activity intolerance will decrease Outcome: Progressing   Problem: Nutrition: Goal: Adequate nutrition will be maintained Outcome: Progressing   Problem: Coping: Goal: Level of anxiety will decrease Outcome: Progressing   Problem: Elimination: Goal: Will not experience complications related to bowel motility Outcome: Progressing Goal: Will not experience complications related to urinary retention Outcome: Progressing   Problem: Pain Managment: Goal: General experience of comfort will improve Outcome: Progressing   Problem: Safety: Goal: Ability to remain free from injury will improve Outcome: Progressing   Problem: Skin Integrity: Goal: Risk for impaired skin integrity will decrease Outcome: Progressing   Problem: Activity: Goal: Ability to perform activities at highest level will improve Outcome: Progressing Goal: Ability to avoid complications of mobility impairment will improve Outcome: Progressing Goal: Ability to tolerate increased activity will improve Outcome: Progressing Goal: Will remain free from falls Outcome: Progressing   Problem: Respiratory: Goal: Ability to maintain adequate ventilation will improve Outcome: Progressing   Problem: Tissue Perfusion: Goal: Hemodynamically stable with effective tissue perfusion will improve Outcome: Progressing Goal: Postoperative complications will be avoided  or minimized Outcome: Progressing   Problem: Skin Integrity: Goal: Ability to maintain adequate tissue integrity will improve Outcome: Progressing   Problem: Infection: Goal: Risk for infection will decrease (spleen) Outcome: Progressing   Problem: Bowel/Gastric: Goal: Gastrointestinal status for postoperative course will improve Outcome: Progressing Goal: GI tract motility and GI tissue perfusion will improve Outcome: Progressing Goal: Ability to demonstrate the techniques of an individualized bowel program will improve Outcome: Progressing   Problem: Urinary Elimination: Goal: Ability to achieve a regular elimination pattern will improve Outcome: Progressing   Problem: Fluid Volume: Goal: Return to normovolemic state will improve Outcome: Progressing   Problem: Metabolic: Goal: Ability to maintain a metabolic balance will improve Outcome: Progressing   Problem: Coping: Goal: Exhibits appropriate coping mechanism and reduced anxiety resulting from physical and emotional stress Outcome: Progressing   Problem: Self-Concept: Goal: Ability to acknowledge body changes will improve Outcome: Progressing   

## 2019-03-01 LAB — GLUCOSE, CAPILLARY
Glucose-Capillary: 112 mg/dL — ABNORMAL HIGH (ref 70–99)
Glucose-Capillary: 124 mg/dL — ABNORMAL HIGH (ref 70–99)
Glucose-Capillary: 125 mg/dL — ABNORMAL HIGH (ref 70–99)
Glucose-Capillary: 126 mg/dL — ABNORMAL HIGH (ref 70–99)
Glucose-Capillary: 128 mg/dL — ABNORMAL HIGH (ref 70–99)
Glucose-Capillary: 90 mg/dL (ref 70–99)

## 2019-03-01 MED ORDER — LORAZEPAM 2 MG/ML IJ SOLN
1.0000 mg | Freq: Four times a day (QID) | INTRAMUSCULAR | Status: DC | PRN
  Administered 2019-03-01 – 2019-03-02 (×3): 2 mg via INTRAVENOUS
  Filled 2019-03-01 (×3): qty 1
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
## 2019-03-02 ENCOUNTER — Inpatient Hospital Stay (HOSPITAL_COMMUNITY): Payer: Medicaid Other | Admitting: Certified Registered Nurse Anesthetist

## 2019-03-02 ENCOUNTER — Encounter (HOSPITAL_COMMUNITY): Admission: EM | Disposition: A | Discharge status: Skilled Nursing Facility | Payer: Self-pay | Source: Home / Self Care

## 2019-03-02 ENCOUNTER — Encounter (HOSPITAL_COMMUNITY): Payer: Self-pay | Admitting: *Deleted

## 2019-03-02 HISTORY — PX: ESOPHAGOGASTRODUODENOSCOPY (EGD) WITH PROPOFOL: SHX5813

## 2019-03-02 HISTORY — PX: PEG PLACEMENT: SHX5437

## 2019-03-02 LAB — GLUCOSE, CAPILLARY
Glucose-Capillary: 104 mg/dL — ABNORMAL HIGH (ref 70–99)
Glucose-Capillary: 98 mg/dL (ref 70–99)
Glucose-Capillary: 124 mg/dL — ABNORMAL HIGH (ref 70–99)
Glucose-Capillary: 167 mg/dL — ABNORMAL HIGH (ref 70–99)

## 2019-03-02 SURGERY — ESOPHAGOGASTRODUODENOSCOPY (EGD) WITH PROPOFOL
Anesthesia: Monitor Anesthesia Care

## 2019-03-02 MED ORDER — ONDANSETRON HCL 4 MG/2ML IJ SOLN
INTRAMUSCULAR | Status: DC | PRN
  Administered 2019-03-02: 4 mg via INTRAVENOUS

## 2019-03-02 MED ORDER — ONDANSETRON HCL 4 MG PO TABS
4.0000 mg | ORAL_TABLET | Freq: Three times a day (TID) | ORAL | Status: DC | PRN
  Administered 2019-03-02: 4 mg
  Filled 2019-03-02: qty 1

## 2019-03-02 MED ORDER — FENTANYL CITRATE (PF) 100 MCG/2ML IJ SOLN
INTRAMUSCULAR | Status: DC | PRN
  Administered 2019-03-02: 25 ug via INTRAVENOUS

## 2019-03-02 MED ORDER — DEXAMETHASONE SODIUM PHOSPHATE 10 MG/ML IJ SOLN
INTRAMUSCULAR | Status: DC | PRN
  Administered 2019-03-02: 5 mg via INTRAVENOUS

## 2019-03-02 MED ORDER — OSMOLITE 1.5 CAL PO LIQD
1000.0000 mL | ORAL | Status: DC
  Administered 2019-03-02: 1000 mL
  Filled 2019-03-02 (×2): qty 1000

## 2019-03-02 MED ORDER — ONDANSETRON HCL 4 MG/5ML PO SOLN: 4.0000 mg | Freq: Three times a day (TID) | ORAL | Status: DC | PRN

## 2019-03-02 MED ORDER — FENTANYL CITRATE (PF) 100 MCG/2ML IJ SOLN
INTRAMUSCULAR | Status: AC
  Filled 2019-03-02: qty 2

## 2019-03-02 MED ORDER — AMLODIPINE BESYLATE 5 MG PO TABS
5.0000 mg | ORAL_TABLET | Freq: Every day | ORAL | Status: DC
  Administered 2019-03-02 – 2019-03-18 (×17): 5 mg
  Filled 2019-03-02 (×17): qty 1

## 2019-03-02 MED ORDER — PROPOFOL 10 MG/ML IV BOLUS
INTRAVENOUS | Status: DC | PRN
  Administered 2019-03-02: 20 mg via INTRAVENOUS

## 2019-03-02 MED ORDER — LACTATED RINGERS IV SOLN
INTRAVENOUS | Status: DC
  Administered 2019-03-02: 08:00:00 via INTRAVENOUS

## 2019-03-02 NOTE — Anesthesia Postprocedure Evaluation (Signed)
Anesthesia Post Note  Patient: Jose [name]  Procedure(s) Performed: ESOPHAGOGASTRODUODENOSCOPY (EGD) WITH PROPOFOL (N/A ) PERCUTANEOUS ENDOSCOPIC GASTROSTOMY (PEG) PLACEMENT (N/A )     Patient location during evaluation: PACU Anesthesia Type: MAC Level of consciousness: awake and alert Pain management: pain level controlled Vital Signs Assessment: post-procedure vital signs reviewed and stable Respiratory status: spontaneous breathing Cardiovascular status: stable Anesthetic complications: no    Last Vitals:  Vitals:   03/02/19 1100 03/02/19 1136  BP:  (!) 153/85  Pulse: 89 94  Resp: 16 11  Temp:  36.9 C  SpO2: 100% 100%    Last Pain:  Vitals:   03/02/19 1136  TempSrc: Oral  PainSc:                  [name]

## 2019-03-02 NOTE — Transfer of Care (Signed)
Immediate Anesthesia Transfer of Care Note  Patient: Jose [name]  Procedure(s) Performed: ESOPHAGOGASTRODUODENOSCOPY (EGD) WITH PROPOFOL (N/A ) PERCUTANEOUS ENDOSCOPIC GASTROSTOMY (PEG) PLACEMENT (N/A )  Patient Location: Endoscopy Unit  Anesthesia Type:General  Level of Consciousness: drowsy  Airway & Oxygen Therapy: Patient Spontanous Breathing and Patient connected to face mask oxygen  Post-op Assessment: Report given to RN, Post -op Vital signs reviewed and stable and Patient moving all extremities  Post vital signs: Reviewed and stable  Last Vitals:  Vitals Value Taken Time  BP 143/84 03/02/2019  8:33 AM  Temp 36.9 C 03/02/2019  8:33 AM  Pulse 93 03/02/2019  8:36 AM  Resp 16 03/02/2019  8:36 AM  SpO2 100 % 03/02/2019  8:36 AM  Vitals shown include unvalidated device data.  Last Pain:  Vitals:   03/02/19 0833  TempSrc: Oral  PainSc:       Patients Stated Pain Goal: 0 (35/32/99 2426)  Complications: No apparent anesthesia complications

## 2019-03-02 NOTE — Progress Notes (Signed)
PT Cancellation Note  Patient Details Name: Jose [name] MRN: [id number] DOB: 08-Mar-1961   Cancelled Treatment:    Reason Eval/Treat Not Completed: Other (comment) Pt just returned from OR from receiving PEG tube. RN gave pt ativan due to patient being agitated as well. Pt unable to participate in PT at this time. Acute PT to return as able, as appropriate.  Kittie Plater, PT, DPT Acute Rehabilitation Services Pager #: [phone] Office #: [phone]    Berline Lopes 03/02/2019, 9:52 AM

## 2019-03-02 NOTE — Anesthesia Preprocedure Evaluation (Signed)
Anesthesia Evaluation  Patient identified by MRN, date of birth, ID band Patient awake    Reviewed: Allergy & Precautions, NPO status , Patient's Chart, lab work & pertinent test results  Airway Mallampati: II  TM Distance: >3 FB Neck ROM: Full    Dental  (+) Dental Advisory Given   Pulmonary neg pulmonary ROS, Current Smoker,    Pulmonary exam normal breath sounds clear to auscultation       Cardiovascular negative cardio ROS Normal cardiovascular exam Rhythm:Regular Rate:Normal     Neuro/Psych negative neurological ROS  negative psych ROS   GI/Hepatic negative GI ROS, Neg liver ROS,   Endo/Other  negative endocrine ROS  Renal/GU negative Renal ROS     Musculoskeletal negative musculoskeletal ROS (+)   Abdominal   Peds  Hematology negative hematology ROS (+)   Anesthesia Other Findings   Reproductive/Obstetrics                             Anesthesia Physical Anesthesia Plan  ASA: III  Anesthesia Plan: MAC   Post-op Pain Management:    Induction: Intravenous  PONV Risk Score and Plan: 0 and Propofol infusion and TIVA  Airway Management Planned:   Additional Equipment:   Intra-op Plan:   Post-operative Plan:   Informed Consent: I have reviewed the patients History and Physical, chart, labs and discussed the procedure including the risks, benefits and alternatives for the proposed anesthesia with the patient or authorized representative who has indicated his/her understanding and acceptance.     Dental advisory given  Plan Discussed with: CRNA  Anesthesia Plan Comments:         Anesthesia Quick Evaluation
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
## 2019-03-02 NOTE — Anesthesia Procedure Notes (Signed)
Date/Time: 03/02/2019 8:13 AM Performed by: Malone Vanblarcom T, CRNA Pre-anesthesia Checklist: Patient identified, Emergency Drugs available, Suction available and Patient being monitored Patient Re-evaluated:Patient Re-evaluated prior to induction Oxygen Delivery Method: Circle system utilized Preoxygenation: Pre-oxygenation with 100% oxygen Induction Type: Tracheostomy

## 2019-03-03 ENCOUNTER — Encounter (HOSPITAL_COMMUNITY): Payer: Self-pay | Admitting: General Surgery

## 2019-03-03 LAB — GLUCOSE, CAPILLARY
Glucose-Capillary: 115 mg/dL — ABNORMAL HIGH (ref 70–99)
Glucose-Capillary: 125 mg/dL — ABNORMAL HIGH (ref 70–99)
Glucose-Capillary: 141 mg/dL — ABNORMAL HIGH (ref 70–99)
Glucose-Capillary: 149 mg/dL — ABNORMAL HIGH (ref 70–99)
Glucose-Capillary: 93 mg/dL (ref 70–99)
Glucose-Capillary: 98 mg/dL (ref 70–99)

## 2019-03-03 MED ORDER — PRO-STAT SUGAR FREE PO LIQD
30.0000 mL | Freq: Two times a day (BID) | ORAL | Status: DC
  Administered 2019-03-03 – 2019-03-18 (×29): 30 mL
  Filled 2019-03-03 (×30): qty 30

## 2019-03-03 MED ORDER — OSMOLITE 1.5 CAL PO LIQD
1000.0000 mL | ORAL | Status: DC
  Administered 2019-03-03 – 2019-03-05 (×3): 1000 mL
  Filled 2019-03-03 (×12): qty 1000

## 2019-03-03 NOTE — Plan of Care (Signed)
  Problem: Health Behavior/Discharge Planning: Goal: Ability to manage health-related needs will improve Outcome: Progressing   Problem: Clinical Measurements: Goal: Ability to maintain clinical measurements within normal limits will improve Outcome: Progressing Goal: Will remain free from infection Outcome: Progressing Goal: Diagnostic test results will improve Outcome: Progressing Goal: Respiratory complications will improve Outcome: Progressing Goal: Cardiovascular complication will be avoided Outcome: Progressing   Problem: Activity: Goal: Risk for activity intolerance will decrease Outcome: Progressing   Problem: Nutrition: Goal: Adequate nutrition will be maintained Outcome: Progressing   Problem: Coping: Goal: Level of anxiety will decrease Outcome: Progressing   Problem: Elimination: Goal: Will not experience complications related to bowel motility Outcome: Progressing Goal: Will not experience complications related to urinary retention Outcome: Progressing   Problem: Pain Managment: Goal: General experience of comfort will improve Outcome: Progressing   Problem: Safety: Goal: Ability to remain free from injury will improve Outcome: Progressing   Problem: Skin Integrity: Goal: Risk for impaired skin integrity will decrease Outcome: Progressing   Problem: Activity: Goal: Ability to perform activities at highest level will improve Outcome: Progressing Goal: Ability to avoid complications of mobility impairment will improve Outcome: Progressing Goal: Ability to tolerate increased activity will improve Outcome: Progressing Goal: Will remain free from falls Outcome: Progressing   Problem: Respiratory: Goal: Ability to maintain adequate ventilation will improve Outcome: Progressing   Problem: Tissue Perfusion: Goal: Hemodynamically stable with effective tissue perfusion will improve Outcome: Progressing Goal: Postoperative complications will be avoided  or minimized Outcome: Progressing   Problem: Skin Integrity: Goal: Ability to maintain adequate tissue integrity will improve Outcome: Progressing   Problem: Infection: Goal: Risk for infection will decrease (spleen) Outcome: Progressing   Problem: Bowel/Gastric: Goal: Gastrointestinal status for postoperative course will improve Outcome: Progressing Goal: GI tract motility and GI tissue perfusion will improve Outcome: Progressing Goal: Ability to demonstrate the techniques of an individualized bowel program will improve Outcome: Progressing   Problem: Urinary Elimination: Goal: Ability to achieve a regular elimination pattern will improve Outcome: Progressing   Problem: Fluid Volume: Goal: Return to normovolemic state will improve Outcome: Progressing   Problem: Metabolic: Goal: Ability to maintain a metabolic balance will improve Outcome: Progressing   Problem: Coping: Goal: Exhibits appropriate coping mechanism and reduced anxiety resulting from physical and emotional stress Outcome: Progressing   Problem: Self-Concept: Goal: Ability to acknowledge body changes will improve Outcome: Progressing   
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
## 2019-03-04 LAB — BASIC METABOLIC PANEL
Anion gap: 11 (ref 5–15)
BUN: 18 mg/dL (ref 6–20)
CO2: 30 mmol/L (ref 22–32)
Calcium: 10.1 mg/dL (ref 8.9–10.3)
Chloride: 95 mmol/L — ABNORMAL LOW (ref 98–111)
Creatinine, Ser: 0.71 mg/dL (ref 0.61–1.24)
GFR calc Af Amer: >60 mL/min (ref >60)
GFR calc non Af Amer: >60 mL/min (ref >60)
Glucose, Bld: 122 mg/dL — ABNORMAL HIGH (ref 70–99)
Potassium: 3.6 mmol/L (ref 3.5–5.1)
Sodium: 136 mmol/L (ref 135–145)

## 2019-03-04 LAB — GLUCOSE, CAPILLARY
Glucose-Capillary: 102 mg/dL — ABNORMAL HIGH (ref 70–99)
Glucose-Capillary: 105 mg/dL — ABNORMAL HIGH (ref 70–99)
Glucose-Capillary: 117 mg/dL — ABNORMAL HIGH (ref 70–99)
Glucose-Capillary: 118 mg/dL — ABNORMAL HIGH (ref 70–99)
Glucose-Capillary: 120 mg/dL — ABNORMAL HIGH (ref 70–99)
Glucose-Capillary: 136 mg/dL — ABNORMAL HIGH (ref 70–99)

## 2019-03-04 LAB — CBC
HCT: 27.4 % — ABNORMAL LOW (ref 39.0–52.0)
Hemoglobin: 9.2 g/dL — ABNORMAL LOW (ref 13.0–17.0)
MCH: 31 pg (ref 26.0–34.0)
MCHC: 33.6 g/dL (ref 30.0–36.0)
MCV: 92.3 fL (ref 80.0–100.0)
Platelets: 302 10*3/uL (ref 150–400)
RBC: 2.97 MIL/uL — ABNORMAL LOW (ref 4.22–5.81)
RDW: 12.6 % (ref 11.5–15.5)
WBC: 13.9 10*3/uL — ABNORMAL HIGH (ref 4.0–10.5)
nRBC: 0 % (ref 0.0–0.2)

## 2019-03-04 MED ORDER — CLONAZEPAM 0.25 MG PO TBDP
1.0000 mg | ORAL_TABLET | Freq: Two times a day (BID) | ORAL | Status: DC
  Administered 2019-03-04 – 2019-03-12 (×17): 1 mg
  Filled 2019-03-04 (×18): qty 4

## 2019-03-04 MED ORDER — METOPROLOL TARTRATE 25 MG/10 ML ORAL SUSPENSION
25.0000 mg | Freq: Two times a day (BID) | ORAL | Status: DC
  Administered 2019-03-04 – 2019-03-18 (×27): 25 mg
  Filled 2019-03-04 (×29): qty 10

## 2019-03-04 MED ORDER — QUETIAPINE FUMARATE 50 MG PO TABS
150.0000 mg | ORAL_TABLET | Freq: Two times a day (BID) | ORAL | Status: DC
  Administered 2019-03-04 – 2019-03-12 (×17): 150 mg
  Filled 2019-03-04 (×19): qty 1

## 2019-03-04 MED ORDER — OXYCODONE HCL 5 MG/5ML PO SOLN
5.0000 mg | Freq: Four times a day (QID) | ORAL | Status: DC | PRN
  Administered 2019-03-04 – 2019-03-14 (×25): 5 mg via ORAL
  Filled 2019-03-04 (×27): qty 5
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
## 2019-03-04 NOTE — Plan of Care (Signed)
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
## 2019-03-05 LAB — CBC
HCT: 25.9 % — ABNORMAL LOW (ref 39.0–52.0)
Hemoglobin: 8.7 g/dL — ABNORMAL LOW (ref 13.0–17.0)
MCH: 30.9 pg (ref 26.0–34.0)
MCHC: 33.6 g/dL (ref 30.0–36.0)
MCV: 91.8 fL (ref 80.0–100.0)
Platelets: 273 10*3/uL (ref 150–400)
RBC: 2.82 MIL/uL — ABNORMAL LOW (ref 4.22–5.81)
RDW: 12.6 % (ref 11.5–15.5)
WBC: 12.1 10*3/uL — ABNORMAL HIGH (ref 4.0–10.5)
nRBC: 0 % (ref 0.0–0.2)

## 2019-03-05 LAB — GLUCOSE, CAPILLARY
Glucose-Capillary: 116 mg/dL — ABNORMAL HIGH (ref 70–99)
Glucose-Capillary: 116 mg/dL — ABNORMAL HIGH (ref 70–99)
Glucose-Capillary: 117 mg/dL — ABNORMAL HIGH (ref 70–99)
Glucose-Capillary: 119 mg/dL — ABNORMAL HIGH (ref 70–99)
Glucose-Capillary: 128 mg/dL — ABNORMAL HIGH (ref 70–99)
Glucose-Capillary: 131 mg/dL — ABNORMAL HIGH (ref 70–99)

## 2019-03-05 MED ORDER — ONDANSETRON HCL 4 MG/2ML IJ SOLN
4.0000 mg | Freq: Three times a day (TID) | INTRAMUSCULAR | Status: DC | PRN
  Administered 2019-03-05: 4 mg via INTRAVENOUS
  Filled 2019-03-05 (×2): qty 2
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
## 2019-03-06 ENCOUNTER — Inpatient Hospital Stay (HOSPITAL_COMMUNITY): Payer: Medicaid Other

## 2019-03-06 LAB — GLUCOSE, CAPILLARY
Glucose-Capillary: 108 mg/dL — ABNORMAL HIGH (ref 70–99)
Glucose-Capillary: 109 mg/dL — ABNORMAL HIGH (ref 70–99)
Glucose-Capillary: 111 mg/dL — ABNORMAL HIGH (ref 70–99)
Glucose-Capillary: 114 mg/dL — ABNORMAL HIGH (ref 70–99)
Glucose-Capillary: 116 mg/dL — ABNORMAL HIGH (ref 70–99)
Glucose-Capillary: 99 mg/dL (ref 70–99)

## 2019-03-06 NOTE — Plan of Care (Signed)
  Problem: Health Behavior/Discharge Planning: Goal: Ability to manage health-related needs will improve Outcome: Progressing   Problem: Clinical Measurements: Goal: Ability to maintain clinical measurements within normal limits will improve Outcome: Progressing Goal: Will remain free from infection Outcome: Progressing Goal: Diagnostic test results will improve Outcome: Progressing Goal: Respiratory complications will improve Outcome: Progressing Goal: Cardiovascular complication will be avoided Outcome: Progressing   Problem: Activity: Goal: Risk for activity intolerance will decrease Outcome: Progressing   Problem: Nutrition: Goal: Adequate nutrition will be maintained Outcome: Progressing   Problem: Coping: Goal: Level of anxiety will decrease Outcome: Progressing   Problem: Elimination: Goal: Will not experience complications related to bowel motility Outcome: Progressing Goal: Will not experience complications related to urinary retention Outcome: Progressing   Problem: Pain Managment: Goal: General experience of comfort will improve Outcome: Progressing   Problem: Safety: Goal: Ability to remain free from injury will improve Outcome: Progressing   Problem: Skin Integrity: Goal: Risk for impaired skin integrity will decrease Outcome: Progressing   Problem: Activity: Goal: Ability to perform activities at highest level will improve Outcome: Progressing Goal: Ability to avoid complications of mobility impairment will improve Outcome: Progressing Goal: Ability to tolerate increased activity will improve Outcome: Progressing Goal: Will remain free from falls Outcome: Progressing   Problem: Respiratory: Goal: Ability to maintain adequate ventilation will improve Outcome: Progressing   Problem: Tissue Perfusion: Goal: Hemodynamically stable with effective tissue perfusion will improve Outcome: Progressing Goal: Postoperative complications will be avoided  or minimized Outcome: Progressing   Problem: Skin Integrity: Goal: Ability to maintain adequate tissue integrity will improve Outcome: Progressing   Problem: Infection: Goal: Risk for infection will decrease (spleen) Outcome: Progressing   Problem: Bowel/Gastric: Goal: Gastrointestinal status for postoperative course will improve Outcome: Progressing Goal: GI tract motility and GI tissue perfusion will improve Outcome: Progressing Goal: Ability to demonstrate the techniques of an individualized bowel program will improve Outcome: Progressing   Problem: Urinary Elimination: Goal: Ability to achieve a regular elimination pattern will improve Outcome: Progressing   Problem: Fluid Volume: Goal: Return to normovolemic state will improve Outcome: Progressing   Problem: Metabolic: Goal: Ability to maintain a metabolic balance will improve Outcome: Progressing   Problem: Coping: Goal: Exhibits appropriate coping mechanism and reduced anxiety resulting from physical and emotional stress Outcome: Progressing   Problem: Self-Concept: Goal: Ability to acknowledge body changes will improve Outcome: Progressing   

## 2019-03-06 NOTE — Progress Notes (Signed)
Spoke with Dr. Grandville Silos regarding trach change. MD stated that it was okay to wait 4 weeks for trach change. Lurline Idol change will be due on March 20, 2019. RT will pass along in report.
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
## 2019-03-06 NOTE — TOC Progression Note (Signed)
Transition of Care [facility]) - Progression Note    Patient Details  Name: Jose [name] MRN: [id number] Date of Birth: [birth year]  Transition [facility]) CM/SW [place], LCSW Phone Number: 03/06/2019, 9:09 AM  Clinical Narrative:    CSW continues to follow patients care. Current barriers to discharge- patient remains in restraints and no insurance.   Goal: Placement at SNF once more stable.     Expected Discharge Plan: [place] Barriers to Discharge: Inadequate or no insurance, Continued Medical Work up  Expected Discharge Plan and Services Expected Discharge Plan: [place] arrangements for the past 2 months: Single Family Home                                       Social Determinants of Health (SDOH) Interventions    Readmission Risk Interventions No flowsheet data found.

## 2019-03-07 LAB — GLUCOSE, CAPILLARY
Glucose-Capillary: 117 mg/dL — ABNORMAL HIGH (ref 70–99)
Glucose-Capillary: 118 mg/dL — ABNORMAL HIGH (ref 70–99)
Glucose-Capillary: 119 mg/dL — ABNORMAL HIGH (ref 70–99)
Glucose-Capillary: 125 mg/dL — ABNORMAL HIGH (ref 70–99)
Glucose-Capillary: 128 mg/dL — ABNORMAL HIGH (ref 70–99)
Glucose-Capillary: 157 mg/dL — ABNORMAL HIGH (ref 70–99)
Glucose-Capillary: 98 mg/dL (ref 70–99)

## 2019-03-07 MED ORDER — GUAIFENESIN 100 MG/5ML PO SOLN
15.0000 mL | Freq: Four times a day (QID) | ORAL | Status: DC
  Administered 2019-03-07 – 2019-03-18 (×42): 300 mg
  Filled 2019-03-07: qty 10
  Filled 2019-03-07: qty 20
  Filled 2019-03-07: qty 30
  Filled 2019-03-07 (×13): qty 20
  Filled 2019-03-07 (×2): qty 10
  Filled 2019-03-07 (×3): qty 20
  Filled 2019-03-07: qty 10
  Filled 2019-03-07: qty 30
  Filled 2019-03-07 (×3): qty 20
  Filled 2019-03-07: qty 10
  Filled 2019-03-07 (×4): qty 20
  Filled 2019-03-07: qty 150
  Filled 2019-03-07: qty 10
  Filled 2019-03-07: qty 30
  Filled 2019-03-07: qty 20
  Filled 2019-03-07: qty 10
  Filled 2019-03-07 (×5): qty 20

## 2019-03-07 NOTE — Plan of Care (Signed)
  Problem: Health Behavior/Discharge Planning: Goal: Ability to manage health-related needs will improve Outcome: Progressing   Problem: Clinical Measurements: Goal: Ability to maintain clinical measurements within normal limits will improve Outcome: Progressing Goal: Will remain free from infection Outcome: Progressing Goal: Diagnostic test results will improve Outcome: Progressing Goal: Respiratory complications will improve Outcome: Progressing Goal: Cardiovascular complication will be avoided Outcome: Progressing   Problem: Activity: Goal: Risk for activity intolerance will decrease Outcome: Progressing   Problem: Nutrition: Goal: Adequate nutrition will be maintained Outcome: Progressing   Problem: Coping: Goal: Level of anxiety will decrease Outcome: Progressing   Problem: Elimination: Goal: Will not experience complications related to bowel motility Outcome: Progressing Goal: Will not experience complications related to urinary retention Outcome: Progressing   Problem: Pain Managment: Goal: General experience of comfort will improve Outcome: Progressing   Problem: Safety: Goal: Ability to remain free from injury will improve Outcome: Progressing   Problem: Skin Integrity: Goal: Risk for impaired skin integrity will decrease Outcome: Progressing   Problem: Activity: Goal: Ability to perform activities at highest level will improve Outcome: Progressing Goal: Ability to avoid complications of mobility impairment will improve Outcome: Progressing Goal: Ability to tolerate increased activity will improve Outcome: Progressing Goal: Will remain free from falls Outcome: Progressing   Problem: Respiratory: Goal: Ability to maintain adequate ventilation will improve Outcome: Progressing   Problem: Tissue Perfusion: Goal: Hemodynamically stable with effective tissue perfusion will improve Outcome: Progressing Goal: Postoperative complications will be avoided  or minimized Outcome: Progressing   Problem: Skin Integrity: Goal: Ability to maintain adequate tissue integrity will improve Outcome: Progressing   Problem: Infection: Goal: Risk for infection will decrease (spleen) Outcome: Progressing   Problem: Bowel/Gastric: Goal: Gastrointestinal status for postoperative course will improve Outcome: Progressing Goal: GI tract motility and GI tissue perfusion will improve Outcome: Progressing Goal: Ability to demonstrate the techniques of an individualized bowel program will improve Outcome: Progressing   Problem: Urinary Elimination: Goal: Ability to achieve a regular elimination pattern will improve Outcome: Progressing   Problem: Fluid Volume: Goal: Return to normovolemic state will improve Outcome: Progressing   Problem: Metabolic: Goal: Ability to maintain a metabolic balance will improve Outcome: Progressing   Problem: Coping: Goal: Exhibits appropriate coping mechanism and reduced anxiety resulting from physical and emotional stress Outcome: Progressing   Problem: Self-Concept: Goal: Ability to acknowledge body changes will improve Outcome: Progressing   
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
## 2019-03-08 LAB — GLUCOSE, CAPILLARY
Glucose-Capillary: 135 mg/dL — ABNORMAL HIGH (ref 70–99)
Glucose-Capillary: 122 mg/dL — ABNORMAL HIGH (ref 70–99)
Glucose-Capillary: 127 mg/dL — ABNORMAL HIGH (ref 70–99)
Glucose-Capillary: 128 mg/dL — ABNORMAL HIGH (ref 70–99)

## 2019-03-08 LAB — URINE CULTURE

## 2019-03-08 LAB — MRSA PCR SCREENING: MRSA by PCR: POSITIVE — AB

## 2019-03-08 MED ORDER — MUPIROCIN 2 % EX OINT
1.0000 "application " | TOPICAL_OINTMENT | Freq: Two times a day (BID) | CUTANEOUS | Status: AC
  Administered 2019-03-08 – 2019-03-13 (×10): 1 via NASAL
  Filled 2019-03-08 (×2): qty 22

## 2019-03-08 MED ORDER — PHENOL 1.4 % MT LIQD
1.0000 | OROMUCOSAL | Status: DC | PRN
  Administered 2019-03-08: 1 via OROMUCOSAL

## 2019-03-08 MED ORDER — CHLORHEXIDINE GLUCONATE CLOTH 2 % EX PADS
6.0000 | MEDICATED_PAD | Freq: Every day | CUTANEOUS | Status: AC
  Administered 2019-03-09 – 2019-03-11 (×3): 6 via TOPICAL
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
## 2019-03-09 ENCOUNTER — Inpatient Hospital Stay (HOSPITAL_COMMUNITY): Payer: Medicaid Other

## 2019-03-09 LAB — GLUCOSE, CAPILLARY
Glucose-Capillary: 92 mg/dL (ref 70–99)
Glucose-Capillary: 134 mg/dL — ABNORMAL HIGH (ref 70–99)
Glucose-Capillary: 124 mg/dL — ABNORMAL HIGH (ref 70–99)
Glucose-Capillary: 118 mg/dL — ABNORMAL HIGH (ref 70–99)

## 2019-03-09 LAB — BASIC METABOLIC PANEL
Anion gap: 8 (ref 5–15)
BUN: 18 mg/dL (ref 6–20)
CO2: 32 mmol/L (ref 22–32)
Calcium: 9.4 mg/dL (ref 8.9–10.3)
Chloride: 97 mmol/L — ABNORMAL LOW (ref 98–111)
Creatinine, Ser: 0.78 mg/dL (ref 0.61–1.24)
GFR calc Af Amer: >60 mL/min (ref >60)
GFR calc non Af Amer: >60 mL/min (ref >60)
Glucose, Bld: 138 mg/dL — ABNORMAL HIGH (ref 70–99)
Potassium: 3.8 mmol/L (ref 3.5–5.1)
Sodium: 137 mmol/L (ref 135–145)

## 2019-03-09 MED ORDER — VANCOMYCIN HCL IN DEXTROSE 1-5 GM/200ML-% IV SOLN
1000.0000 mg | Freq: Two times a day (BID) | INTRAVENOUS | Status: DC
  Administered 2019-03-10 – 2019-03-12 (×6): 1000 mg via INTRAVENOUS
  Filled 2019-03-09 (×6): qty 200

## 2019-03-09 MED ORDER — VANCOMYCIN HCL 10 G IV SOLR
1250.0000 mg | Freq: Once | INTRAVENOUS | Status: AC
  Administered 2019-03-09: 1250 mg via INTRAVENOUS
  Filled 2019-03-09: qty 1250

## 2019-03-09 MED ORDER — SODIUM CHLORIDE 0.9 % IV SOLN
1.0000 g | Freq: Three times a day (TID) | INTRAVENOUS | Status: AC
  Administered 2019-03-09 – 2019-03-16 (×21): 1 g via INTRAVENOUS
  Filled 2019-03-09 (×21): qty 1
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
## 2019-03-09 NOTE — Progress Notes (Signed)
Pharmacy Antibiotic Note  Jose [name] is a 58 y.o. male admitted on 01/22/2019 with pneumonia - recently completed course of cefepime for pseudomonas and enterobacter in trach aspirate, now trach aspirate from 6/12 growing pseudomonas, strep pneumo, and few MRSA. Per Surgery, could be colonization but since cultures were drawn due to patient spiking fevers, will treat for now. Pharmacy has been consulted for vancomycin/meropenem dosing. Last SCr stable 0.71 on 6/10. Currently afebrile.  Plan: Vancomycin 1250mg  IV x 1; then 1000 mg IV Q 12 hrs. Goal AUC 400-550. Expected AUC: 495 SCr used: 0.8 Meropenem 1g IV q8h Monitor clinical progress, c/s, renal function F/u de-escalation plan/LOT, vancomycin levels as indicated  Height: 5\' 10"  (177.8 cm) Weight: 133 lb 6.4 oz (60.5 kg) IBW/kg (Calculated) : 73  Temp (24hrs), Avg:98.7 F (37.1 C), Min:98.2 F (36.8 C), Max:99.3 F (37.4 C)  Recent Labs  Lab 03/04/19 0833 03/05/19 1053  WBC 13.9* 12.1*  CREATININE 0.71  --     Estimated Creatinine Clearance: 88.2 mL/min (by C-G formula based on SCr of 0.71 mg/dL).    No Known Allergies  Elicia Lamp, PharmD, BCPS Please check AMION for all [place] contact numbers Clinical Pharmacist 03/09/2019 12:05 PM

## 2019-03-09 NOTE — Progress Notes (Signed)
Modified Barium Swallow Progress Note  Patient Details  Name: Jose [name] MRN: [id number] Date of Birth: June 04, 1961  Today's Date: 03/09/2019  Modified Barium Swallow completed.  Full report located under Chart Review in the Imaging Section.  Brief recommendations include the following:  Clinical Impression  Pt continues to exhibit severe oropharyngeal dysphagia, with marginal improvements that are not sufficient to safely start POs. He continues to present with reduced posterior lingual propulsion.  He has begun to show limited anterior and superior movement of the hyoid, but the laryngeal excursion is not enough to open UES, and there is no epiglottic closure over the airway.  Inadequate laryngeal vestibule closure leads to penetration of puree in large quantities towards the vocal folds and aspiration of thin liquids.  There was significant and diffuse residue throughout the pharynx that pt could not transition through UES. Pt was able to generate throat-clearing/cough to eject material and expectorate into [place].  Video was shown to pt after the study; he had difficulty comprehending results given his cognitive deficits.  REC: continue NPO; PEG feedings.  SLP to continue swallowing therapy with focus on  increasing mobility of laryngeal/pharyngeal structures.    Swallow Evaluation Recommendations       SLP Diet Recommendations: NPO       Medication Administration: Via alternative means               Oral Care Recommendations: Oral care QID     Aoife Bold L. [name], [place] Office number [phone] Pager [phone]    [name] 03/09/2019,1:42 PM
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
## 2019-03-09 NOTE — TOC Progression Note (Signed)
Transition of Care [facility]) - Progression Note    Patient Details  Name: Jose [name] MRN: [id number] Date of Birth: 1960/12/16  Transition of Care [facility]) CM/SW [place], [place] Phone Number: 03/09/2019, 3:34 PM  Clinical Narrative:    Pt LOG placement, no current bed offers. Continue to follow and make referrals. Aware pt stable, out of restraints. Medicaid has been submitted.    Expected Discharge Plan: [place] Barriers to Discharge: Inadequate or no insurance, Continued Medical Work up  Expected Discharge Plan and Services Expected Discharge Plan: [place] arrangements for the past 2 months: Single Family Home   Social Determinants of Health (SDOH) Interventions    Readmission Risk Interventions No flowsheet data found.

## 2019-03-10 LAB — GLUCOSE, CAPILLARY
Glucose-Capillary: 101 mg/dL — ABNORMAL HIGH (ref 70–99)
Glucose-Capillary: 117 mg/dL — ABNORMAL HIGH (ref 70–99)
Glucose-Capillary: 118 mg/dL — ABNORMAL HIGH (ref 70–99)
Glucose-Capillary: 120 mg/dL — ABNORMAL HIGH (ref 70–99)
Glucose-Capillary: 133 mg/dL — ABNORMAL HIGH (ref 70–99)
Glucose-Capillary: 95 mg/dL (ref 70–99)

## 2019-03-10 LAB — CULTURE, RESPIRATORY W GRAM STAIN

## 2019-03-10 MED ORDER — OSMOLITE 1.5 CAL PO LIQD
480.0000 mL | Freq: Three times a day (TID) | ORAL | Status: DC
  Administered 2019-03-10 – 2019-03-11 (×2): 480 mL
  Filled 2019-03-10 (×4): qty 711

## 2019-03-10 MED ORDER — FREE WATER
100.0000 mL | Freq: Three times a day (TID) | Status: DC
  Administered 2019-03-10 – 2019-03-17 (×22): 100 mL
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
## 2019-03-10 NOTE — NC FL2 (Addendum)
[place] LEVEL OF CARE SCREENING TOOL     IDENTIFICATION  Patient Name: Jose [name] Birthdate: Aug 22, 1961 Sex: male Admission Date (Current Location): 01/22/2019  [facility] and [place] Number:  Engineering geologist and Address:  [facility] [place], [place] [address] [place], [place] [address]      Provider Number: [id number]  Attending Physician Name and Address:  Md, Trauma, MD  Relative Name and Phone Number:  Dovid Bartko; QHUTMLY;[phone]    Current Level of Care: Hospital Recommended Level of Care: [place] Prior Approval Number:    Date Approved/Denied:   PASRR Number: pending  Discharge Plan: SNF    Current Diagnoses: Patient Active Problem List   Diagnosis Date Noted  . SDH (subdural hematoma) ([place]) 01/23/2019    Orientation RESPIRATION BLADDER Height & Weight     Self, Situation, Time  Tracheostomy (33m shilley cuffless) Continent, External catheter Weight: 133 lb 6.4 oz (60.5 kg) Height:  _0  (177.8 cm)  BEHAVIORAL SYMPTOMS/MOOD NEUROLOGICAL BOWEL NUTRITION STATUS      Continent Diet, Feeding tube(Osmolite 1.5 to 60 ml/hr via PEG; 30 ml Prostat to BID; 100 ml free water flush every 8 hours)  AMBULATORY STATUS COMMUNICATION OF NEEDS Skin   Extensive Assist Verbally Skin abrasions(abrasions on knees; arms; face)                       Personal Care Assistance Level of Assistance  Bathing, Feeding, Dressing Bathing Assistance: Maximum assistance Feeding assistance: Maximum assistance(tube feeds) Dressing Assistance: Maximum assistance     Functional Limitations Info  Sight, Speech, Hearing Sight Info: Adequate Hearing Info: Adequate Speech Info: Adequate    SPECIAL CARE FACTORS FREQUENCY  PT (By licensed PT), OT (By licensed OT)     PT Frequency: 5x week OT Frequency: 5x week            Contractures Contractures Info: Not present    Additional Factors Info  Code Status,  Allergies, Psychotropic Code Status Info: Full Code Allergies Info: No Known Allergies Psychotropic Info: clonazePAM (KLONOPIN) disintegrating tablet 1 mg 2x daily per tube;QUEtiapine (SEROQUEL) tablet 150 mg 2x daily per tube         Current Medications (03/10/2019):  This is the current hospital active medication list Current Facility-Administered Medications  Medication Dose Route Frequency Provider Last Rate Last Dose  . 0.45 % NaCl with KCl 20 mEq / L infusion   Intravenous Continuous WRolm Bookbinder MD 50 mL/hr at 03/09/19 1126    . acetaminophen (TYLENOL) solution 650 mg  650 mg Per Tube Q6H PRN RRalene Ok MD   650 mg at 03/06/19 0932  . amLODipine (NORVASC) tablet 5 mg  5 mg Per Tube Daily Rayburn, Kelly A, PA-C   5 mg at 03/10/19 1156  . chlorhexidine gluconate (MEDLINE KIT) (PERIDEX) 0.12 % solution 15 mL  15 mL Mouth Rinse BID RRalene Ok MD   15 mL at 03/10/19 0800  . Chlorhexidine Gluconate Cloth 2 % PADS 6 each  6 each Topical Q0600 Rayburn, Kelly A, PA-C   6 each at 03/10/19 1209  . clonazePAM (KLONOPIN) disintegrating tablet 1 mg  1 mg Per Tube BID Rayburn, Kelly A, PA-C   1 mg at 03/10/19 1157  . docusate (COLACE) 50 MG/5ML liquid 50 mg  50 mg Per Tube Daily TDonnie Mesa MD   50 mg at 03/10/19 1158  . enoxaparin (LOVENOX) injection 40 mg  40 mg Subcutaneous Q24H  Georganna Skeans, MD   40 mg at 03/10/19 1155  . feeding supplement (OSMOLITE 1.5 CAL) liquid 1,000 mL  1,000 mL Per Tube Continuous Georganna Skeans, MD 60 mL/hr at 03/05/19 0613 1,000 mL at 03/05/19 0613  . feeding supplement (PRO-STAT SUGAR FREE 64) liquid 30 mL  30 mL Per Tube BID Georganna Skeans, MD   30 mL at 03/10/19 1156  . folic acid injection 1 mg  1 mg Intravenous Daily Georganna Skeans, MD   1 mg at 03/10/19 1156  . free water 100 mL  100 mL Per Tube Q8H Meuth, Brooke A, PA-C   100 mL at 03/10/19 1421  . guaiFENesin (ROBITUSSIN) 100 MG/5ML solution 300 mg  15 mL Per Tube Q6H Rayburn, Kelly A,  PA-C   300 mg at 03/10/19 1158  . hydrALAZINE (APRESOLINE) injection 10 mg  10 mg Intravenous Q4H PRN Kinsinger, Arta Bruce, MD   10 mg at 02/20/19 0555  . hydrochlorothiazide (MICROZIDE) capsule 25 mg  25 mg Per Tube Daily Georganna Skeans, MD   25 mg at 03/10/19 1157  . LORazepam (ATIVAN) injection 1-2 mg  1-2 mg Intravenous Q6H PRN Focht, Jessica L, PA   2 mg at 03/02/19 1431  . MEDLINE mouth rinse  15 mL Mouth Rinse 10 times per day Ralene Ok, MD   15 mL at 03/10/19 1349  . meropenem (MERREM) 1 g in sodium chloride 0.9 % 100 mL IVPB  1 g Intravenous Q8H Romona Curls, RPH 200 mL/hr at 03/10/19 1454 1 g at 03/10/19 1454  . metoprolol tartrate (LOPRESSOR) 25 mg/10 mL oral suspension 25 mg  25 mg Per Tube BID Rayburn, Kelly A, PA-C   25 mg at 03/10/19 1157  . metoprolol tartrate (LOPRESSOR) injection 10 mg  10 mg Intravenous Q3H PRN Ralene Ok, MD   10 mg at 02/28/19 0438  . multivitamin with minerals tablet 1 tablet  1 tablet Per Tube Daily Ralene Ok, MD   1 tablet at 03/10/19 1158  . mupirocin ointment (BACTROBAN) 2 % 1 application  1 application Nasal BID Rayburn, Kelly A, PA-C   1 application at 40/34/74 1210  . naloxone [facility]) injection 0.4 mg  0.4 mg Intravenous PRN Kinsinger, Arta Bruce, MD      . ondansetron [facility]) injection 4 mg  4 mg Intravenous Q8H PRN Georganna Skeans, MD   4 mg at 03/05/19 1714  . ondansetron (ZOFRAN) tablet 4 mg  4 mg Per Tube Q8H PRN Rayburn, Kelly A, PA-C   4 mg at 03/02/19 1421  . oxyCODONE (ROXICODONE) 5 MG/5ML solution 5 mg  5 mg Oral Q6H PRN Rayburn, Kelly A, PA-C   5 mg at 03/10/19 1344  . pantoprazole sodium (PROTONIX) 40 mg/20 mL oral suspension 40 mg  40 mg Per Tube Daily Georganna Skeans, MD   40 mg at 03/10/19 1156  . phenol (CHLORASEPTIC) mouth spray 1 spray  1 spray Mouth/Throat PRN Rayburn, Kelly A, PA-C   1 spray at 03/08/19 0824  . polyethylene glycol (MIRALAX / GLYCOLAX) packet 17 g  17 g Per Tube Daily Georganna Skeans, MD   17 g  at 03/10/19 1056  . QUEtiapine (SEROQUEL) tablet 150 mg  150 mg Per Tube BID Rayburn, Kelly A, PA-C   150 mg at 03/10/19 1156  . sodium chloride flush (NS) 0.9 % injection 10-40 mL  10-40 mL Intracatheter PRN Erline Levine, MD      . thiamine (VITAMIN B-1) tablet 100 mg  100 mg Per  Tube Daily Donnie Mesa, MD   100 mg at 03/10/19 1157  . vancomycin (VANCOCIN) IVPB 1000 mg/200 mL premix  1,000 mg Intravenous Q12H Romona Curls, RPH 200 mL/hr at 03/10/19 1341 1,000 mg at 03/10/19 1341     Discharge Medications: Please see discharge summary for a list of discharge medications.  Relevant Imaging Results:  Relevant Lab Results:   Additional Information [id number] [place], [place]

## 2019-03-10 NOTE — Progress Notes (Signed)
Case discussed in LOS meeting difficult to place; Financial Counselor called to make sure Medicaid / Disability paperwork has been initiated; CM/ SW will continue to progress the patient for discharge; [place] Supervisor [phone]
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
## 2019-03-11 LAB — CBC
HCT: 25.7 % — ABNORMAL LOW (ref 39.0–52.0)
Hemoglobin: 8.5 g/dL — ABNORMAL LOW (ref 13.0–17.0)
MCH: 30.5 pg (ref 26.0–34.0)
MCHC: 33.1 g/dL (ref 30.0–36.0)
MCV: 92.1 fL (ref 80.0–100.0)
Platelets: 215 10*3/uL (ref 150–400)
RBC: 2.79 MIL/uL — ABNORMAL LOW (ref 4.22–5.81)
RDW: 12.5 % (ref 11.5–15.5)
WBC: 7.1 10*3/uL (ref 4.0–10.5)
nRBC: 0 % (ref 0.0–0.2)

## 2019-03-11 LAB — BASIC METABOLIC PANEL
Anion gap: 9 (ref 5–15)
BUN: 14 mg/dL (ref 6–20)
CO2: 31 mmol/L (ref 22–32)
Calcium: 9.6 mg/dL (ref 8.9–10.3)
Chloride: 95 mmol/L — ABNORMAL LOW (ref 98–111)
Creatinine, Ser: 0.62 mg/dL (ref 0.61–1.24)
GFR calc Af Amer: >60 mL/min (ref >60)
GFR calc non Af Amer: >60 mL/min (ref >60)
Glucose, Bld: 106 mg/dL — ABNORMAL HIGH (ref 70–99)
Potassium: 3.7 mmol/L (ref 3.5–5.1)
Sodium: 135 mmol/L (ref 135–145)

## 2019-03-11 LAB — GLUCOSE, CAPILLARY
Glucose-Capillary: 103 mg/dL — ABNORMAL HIGH (ref 70–99)
Glucose-Capillary: 104 mg/dL — ABNORMAL HIGH (ref 70–99)
Glucose-Capillary: 112 mg/dL — ABNORMAL HIGH (ref 70–99)
Glucose-Capillary: 142 mg/dL — ABNORMAL HIGH (ref 70–99)
Glucose-Capillary: 83 mg/dL (ref 70–99)
Glucose-Capillary: 88 mg/dL (ref 70–99)

## 2019-03-11 MED ORDER — OSMOLITE 1.5 CAL PO LIQD
474.0000 mL | Freq: Three times a day (TID) | ORAL | Status: DC
  Administered 2019-03-11 – 2019-03-18 (×20): 474 mL
  Filled 2019-03-11 (×4): qty 474
  Filled 2019-03-11: qty 1000
  Filled 2019-03-11 (×4): qty 474
  Filled 2019-03-11: qty 1000
  Filled 2019-03-11 (×3): qty 474
  Filled 2019-03-11 (×2): qty 1000
  Filled 2019-03-11: qty 474
  Filled 2019-03-11 (×2): qty 1000
  Filled 2019-03-11 (×6): qty 474
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
## 2019-03-12 DIAGNOSIS — S069X9A Unspecified intracranial injury with loss of consciousness of unspecified duration, initial encounter: Secondary | ICD-10-CM

## 2019-03-12 DIAGNOSIS — Z930 Tracheostomy status: Secondary | ICD-10-CM

## 2019-03-12 DIAGNOSIS — Z931 Gastrostomy status: Secondary | ICD-10-CM

## 2019-03-12 DIAGNOSIS — R1312 Dysphagia, oropharyngeal phase: Secondary | ICD-10-CM

## 2019-03-12 DIAGNOSIS — S065X9A Traumatic subdural hemorrhage with loss of consciousness of unspecified duration, initial encounter: Principal | ICD-10-CM

## 2019-03-12 LAB — GLUCOSE, CAPILLARY
Glucose-Capillary: 125 mg/dL — ABNORMAL HIGH (ref 70–99)
Glucose-Capillary: 126 mg/dL — ABNORMAL HIGH (ref 70–99)
Glucose-Capillary: 131 mg/dL — ABNORMAL HIGH (ref 70–99)
Glucose-Capillary: 76 mg/dL (ref 70–99)
Glucose-Capillary: 81 mg/dL (ref 70–99)
Glucose-Capillary: 81 mg/dL (ref 70–99)

## 2019-03-12 LAB — VANCOMYCIN, TROUGH: Vancomycin Tr: 18 ug/mL (ref 15–20)

## 2019-03-12 LAB — VANCOMYCIN, PEAK: Vancomycin Pk: 41 ug/mL — ABNORMAL HIGH (ref 30–40)

## 2019-03-12 MED ORDER — VANCOMYCIN HCL IN DEXTROSE 750-5 MG/150ML-% IV SOLN
750.0000 mg | Freq: Two times a day (BID) | INTRAVENOUS | Status: AC
  Administered 2019-03-13 – 2019-03-16 (×7): 750 mg via INTRAVENOUS
  Filled 2019-03-12 (×8): qty 150
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
## 2019-03-12 NOTE — Progress Notes (Signed)
Pharmacy Antibiotic Note  Jose [name] is a 58 y.o. male admitted on 01/22/2019 with pneumonia - recently completed course of cefepime for pseudomonas and enterobacter in trach aspirate, now trach aspirate from 6/12 growing pseudomonas, strep pneumo, and few MRSA. Per Surgery, could be colonization but since cultures were drawn due to patient spiking fevers, will treat for now.   Had been on Vanc 1 g q12h: levels drawn with peak of 41 and trough of 18 for an AUC of 642 (goal 400 - 550)  Plan: Reduce vanc to 750 mg q12h New levels at steady state  Height: 5\' 10"  (177.8 cm) Weight: 133 lb 6.4 oz (60.5 kg) IBW/kg (Calculated) : 73  Temp (24hrs), Avg:98.6 F (37 C), Min:98.1 F (36.7 C), Max:99.1 F (37.3 C)  Recent Labs  Lab 03/09/19 1040 03/11/19 0337 03/12/19 1218 03/12/19 1443  WBC  --  7.1  --   --   CREATININE 0.78 0.62  --   --   VANCOTROUGH  --   --  18  --   VANCOPEAK  --   --   --  41*    Estimated Creatinine Clearance: 88.2 mL/min (by C-G formula based on SCr of 0.62 mg/dL).    No Known Allergies  Levester Fresh, PharmD, BCPS, BCCCP Clinical Pharmacist [phone]  Please check AMION for all [facility] numbers  03/12/2019 4:50 PM

## 2019-03-13 LAB — CBC
HCT: 24.8 % — ABNORMAL LOW (ref 39.0–52.0)
Hemoglobin: 8.1 g/dL — ABNORMAL LOW (ref 13.0–17.0)
MCH: 30.2 pg (ref 26.0–34.0)
MCHC: 32.7 g/dL (ref 30.0–36.0)
MCV: 92.5 fL (ref 80.0–100.0)
Platelets: 205 10*3/uL (ref 150–400)
RBC: 2.68 MIL/uL — ABNORMAL LOW (ref 4.22–5.81)
RDW: 12.7 % (ref 11.5–15.5)
WBC: 7.1 10*3/uL (ref 4.0–10.5)
nRBC: 0 % (ref 0.0–0.2)

## 2019-03-13 LAB — BASIC METABOLIC PANEL WITH GFR
Anion gap: 7 (ref 5–15)
BUN: 21 mg/dL — ABNORMAL HIGH (ref 6–20)
CO2: 32 mmol/L (ref 22–32)
Calcium: 9.3 mg/dL (ref 8.9–10.3)
Chloride: 97 mmol/L — ABNORMAL LOW (ref 98–111)
Creatinine, Ser: 0.68 mg/dL (ref 0.61–1.24)
GFR calc Af Amer: >60 mL/min
GFR calc non Af Amer: >60 mL/min
Glucose, Bld: 99 mg/dL (ref 70–99)
Potassium: 3.8 mmol/L (ref 3.5–5.1)
Sodium: 136 mmol/L (ref 135–145)

## 2019-03-13 LAB — GLUCOSE, CAPILLARY
Glucose-Capillary: 134 mg/dL — ABNORMAL HIGH (ref 70–99)
Glucose-Capillary: 105 mg/dL — ABNORMAL HIGH (ref 70–99)
Glucose-Capillary: 109 mg/dL — ABNORMAL HIGH (ref 70–99)
Glucose-Capillary: 99 mg/dL (ref 70–99)
Glucose-Capillary: 110 mg/dL — ABNORMAL HIGH (ref 70–99)
Glucose-Capillary: 96 mg/dL (ref 70–99)

## 2019-03-13 MED ORDER — CLONAZEPAM 0.25 MG PO TBDP: 1.0000 mg | ORAL_TABLET | Freq: Two times a day (BID) | ORAL | Status: DC | PRN

## 2019-03-13 MED ORDER — QUETIAPINE FUMARATE 100 MG PO TABS
100.0000 mg | ORAL_TABLET | Freq: Every day | ORAL | Status: DC
  Administered 2019-03-13 – 2019-03-17 (×5): 100 mg
  Filled 2019-03-13 (×5): qty 1

## 2019-03-13 MED ORDER — CLONAZEPAM 0.25 MG PO TBDP
1.0000 mg | ORAL_TABLET | Freq: Two times a day (BID) | ORAL | Status: DC | PRN
  Administered 2019-03-17 – 2019-03-18 (×2): 1 mg
  Filled 2019-03-13 (×2): qty 4

## 2019-03-13 NOTE — Progress Notes (Signed)
Inpatient Rehab Admissions Coordinator:   I spoke extensively on the phone with Elenore Rota, his wife, Webb Silversmith, and pt's sister, Mariann Laster.  I reviewed goals and expectations of a short term rehab stay in CIR, with expectation that pt would need 24/7 supervision at discharge from our facility.  Unfortunately, family is unable to provide this and agrees that allowing pt longer to recover in a SNF setting is the best option.  Will let CSW know and sign off.    Shann Medal, PT, DPT Admissions Coordinator [phone] 03/13/19  12:13 PM

## 2019-03-13 NOTE — Progress Notes (Signed)
03/12/2019 - Case discussed in LOS with Medical Director, Assist Director of Soc Work. CM /SW will continue to follow for progression of care. B Felipa Laroche RN,MHA,BSN Advance Care Supervisor [phone] 

## 2019-03-13 NOTE — TOC Progression Note (Signed)
Transition of Care [facility]) - Progression Note    Patient Details  Name: Jose [name] MRN: [id number] Date of Birth: 01-19-1961  Transition of Care [facility]) CM/SW [place], [place] Phone Number: 03/13/2019, 12:29 PM  Clinical Narrative:    Aware CIR has consulted with family, he does not have enough assist for CIR admission at this time. Continue to search for LOG, have f/u with [facility] and [facility].   Expected Discharge Plan: [place] Barriers to Discharge: Inadequate or no insurance, Continued Medical Work up  Expected Discharge Plan and Services Expected Discharge Plan: [place] arrangements for the past 2 months: Single Family Home     Social Determinants of Health (SDOH) Interventions    Readmission Risk Interventions No flowsheet data found.

## 2019-03-13 NOTE — Progress Notes (Signed)
Inpatient Rehab Admissions:  Inpatient Rehab Consult received.  I met with patient at the bedside for rehabilitation assessment and to discuss goals and expectations of an inpatient rehab admission.  He is oriented, rancho level 6, and states that he needs "a kick in the pants to get to work." He also is perseverative on eating/drinking throughout my visit.  He did give me permission to call his brother, Elenore Rota, to discuss dispo, which I will do today. I will need to confirm patient has 24/7 supervision in place for discharge before I can look at possibly admitting him.   Signed: Shann Medal, PT, DPT Admissions Coordinator [phone] 03/13/19  10:25 AM
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
## 2019-03-14 LAB — GLUCOSE, CAPILLARY
Glucose-Capillary: 118 mg/dL — ABNORMAL HIGH (ref 70–99)
Glucose-Capillary: 142 mg/dL — ABNORMAL HIGH (ref 70–99)
Glucose-Capillary: 144 mg/dL — ABNORMAL HIGH (ref 70–99)
Glucose-Capillary: 87 mg/dL (ref 70–99)
Glucose-Capillary: 89 mg/dL (ref 70–99)
Glucose-Capillary: 92 mg/dL (ref 70–99)
Glucose-Capillary: 94 mg/dL (ref 70–99)

## 2019-03-14 MED ORDER — OXYCODONE HCL 5 MG/5ML PO SOLN
5.0000 mg | Freq: Four times a day (QID) | ORAL | Status: DC | PRN
  Administered 2019-03-14 – 2019-03-18 (×9): 5 mg
  Filled 2019-03-14 (×9): qty 5
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
## 2019-03-15 LAB — GLUCOSE, CAPILLARY
Glucose-Capillary: 100 mg/dL — ABNORMAL HIGH (ref 70–99)
Glucose-Capillary: 130 mg/dL — ABNORMAL HIGH (ref 70–99)
Glucose-Capillary: 112 mg/dL — ABNORMAL HIGH (ref 70–99)
Glucose-Capillary: 141 mg/dL — ABNORMAL HIGH (ref 70–99)
Glucose-Capillary: 124 mg/dL — ABNORMAL HIGH (ref 70–99)

## 2019-03-15 NOTE — Progress Notes (Signed)
Pharmacy Antibiotic Note  Jose [name] is a 58 y.o. male admitted on 01/22/2019 with pneumonia - recently completed course of cefepime for pseudomonas and enterobacter in trach aspirate, now trach aspirate from 6/12 growing pseudomonas, strep pneumo, and few MRSA. Per Surgery, could be colonization but since cultures were drawn due to patient spiking fevers, will treat for now.   Day#7 of Vancomycin and Merrem for MRSA, Pseudomonas, and Strep pneumo in the trach aspirate. Stop dates have been added after today's doses as per Dr. Dois Davenport progress note on 6/20 indicating duration of 7 days.   Plan: Continue vancomycin 750 mg q12h. Stop date in place. Pharmacy will sign off.   Height: 5\' 10"  (177.8 cm) Weight: 145 lb (65.8 kg) IBW/kg (Calculated) : 73  Temp (24hrs), Avg:98.8 F (37.1 C), Min:98.3 F (36.8 C), Max:99.6 F (37.6 C)  Recent Labs  Lab 03/09/19 1040 03/11/19 0337 03/12/19 1218 03/12/19 1443 03/13/19 0659  WBC  --  7.1  --   --  7.1  CREATININE 0.78 0.62  --   --  0.68  VANCOTROUGH  --   --  18  --   --   VANCOPEAK  --   --   --  41*  --     Estimated Creatinine Clearance: 96 mL/min (by C-G formula based on SCr of 0.68 mg/dL).    No Known Allergies  Sloan Leiter, PharmD, BCPS, BCCCP Clinical Pharmacist Please refer to [facility] for [place] numbers  Please check AMION for all [place] numbers  03/15/2019 7:44 AM
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
## 2019-03-16 ENCOUNTER — Inpatient Hospital Stay (HOSPITAL_COMMUNITY): Payer: Medicaid Other

## 2019-03-16 LAB — GLUCOSE, CAPILLARY
Glucose-Capillary: 121 mg/dL — ABNORMAL HIGH (ref 70–99)
Glucose-Capillary: 124 mg/dL — ABNORMAL HIGH (ref 70–99)
Glucose-Capillary: 155 mg/dL — ABNORMAL HIGH (ref 70–99)
Glucose-Capillary: 82 mg/dL (ref 70–99)
Glucose-Capillary: 96 mg/dL (ref 70–99)

## 2019-03-16 MED ORDER — RESOURCE THICKENUP CLEAR PO POWD
ORAL | Status: DC | PRN
  Filled 2019-03-16: qty 125
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
## 2019-03-16 NOTE — Progress Notes (Signed)
  Speech Language Pathology  Patient Details Name: Jose [name] MRN: [id number] DOB: 06-Jul-1961 Today's Date: 03/16/2019 Time:  -      MBS scheduled this morning at 11:00      Jose [name] [place] M.Ed Risk analyst [phone] Office [phone]

## 2019-03-16 NOTE — Procedures (Signed)
Tracheostomy Change Note  Patient Details:   Name: Jose [name] DOB: Aug 05, 1961 MRN: [id number]    Airway Documentation:     Evaluation  O2 sats: stable throughout Complications: No apparent complications Patient did tolerate procedure well. Bilateral Breath Sounds: Clear, Diminished    Rudene Re 03/16/2019, 3:22 PM
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
## 2019-03-16 NOTE — TOC Progression Note (Signed)
Transition of Care [facility]) - Progression Note    Patient Details  Name: Jose [name] MRN: [id number] Date of Birth: [birth year]  Transition of Care [facility]) CM/SW [place], [place] Phone Number: 03/16/2019, 5:12 PM  Clinical Narrative:    Pt has bed at [facility]. Spoke with pt brother; explained as long as pt medically stable and has a safe discharge plan we will need to proceed with dc and explained that pt needs continued therapy and management before he can go home. Discussed 30 day LOG and that pt would have therapies as well as room and board. Pt brother provided Medicare.gov link to [place]. We discussed that we would contact him prior to dc to discuss timing of dc. Pt brother states understanding.    Expected Discharge Plan: [place] Barriers to Discharge: Inadequate or no insurance, Continued Medical Work up  Expected Discharge Plan and Services Expected Discharge Plan: [place] In-house Referral: Clinical Social Work, Conservation officer, nature Services: [employer] Consult Post Acute Care Choice: [place] Living arrangements for the past 2 months: Single Family Home, No permanent address      Social Determinants of Health (SDOH) Interventions    Readmission Risk Interventions No flowsheet data found.

## 2019-03-17 LAB — GLUCOSE, CAPILLARY
Glucose-Capillary: 100 mg/dL — ABNORMAL HIGH (ref 70–99)
Glucose-Capillary: 122 mg/dL — ABNORMAL HIGH (ref 70–99)
Glucose-Capillary: 82 mg/dL (ref 70–99)
Glucose-Capillary: 88 mg/dL (ref 70–99)
Glucose-Capillary: 88 mg/dL (ref 70–99)
Glucose-Capillary: 98 mg/dL (ref 70–99)

## 2019-03-17 LAB — NOVEL CORONAVIRUS, NAA (HOSP ORDER, SEND-OUT TO REF LAB; TAT 18-24 HRS): SARS-CoV-2, NAA: NOT DETECTED

## 2019-03-17 MED ORDER — FOLIC ACID 1 MG PO TABS
1.0000 mg | ORAL_TABLET | Freq: Every day | ORAL | Status: DC
  Administered 2019-03-18: 1 mg
  Filled 2019-03-17: qty 1
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
## 2019-03-17 NOTE — Progress Notes (Signed)
Chart reviewed for LOS B Rashaun Wichert RN,MHA,BSN Advance Care Supervisor [phone] 
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
## 2019-03-18 LAB — GLUCOSE, CAPILLARY
Glucose-Capillary: 110 mg/dL — ABNORMAL HIGH (ref 70–99)
Glucose-Capillary: 116 mg/dL — ABNORMAL HIGH (ref 70–99)
Glucose-Capillary: 127 mg/dL — ABNORMAL HIGH (ref 70–99)
Glucose-Capillary: 96 mg/dL (ref 70–99)

## 2019-03-18 MED ORDER — OXYCODONE HCL 5 MG PO TABS: 5.0000 mg | ORAL_TABLET | Freq: Four times a day (QID) | ORAL | 0 refills | Status: DC | PRN

## 2019-03-18 MED ORDER — GUAIFENESIN 100 MG/5ML PO SOLN: 15.0000 mL | Freq: Four times a day (QID) | ORAL | 0 refills | Status: DC

## 2019-03-18 MED ORDER — QUETIAPINE FUMARATE 100 MG PO TABS: 100.0000 mg | ORAL_TABLET | Freq: Every day | ORAL | Status: DC

## 2019-03-18 MED ORDER — METOPROLOL TARTRATE 25 MG/10 ML ORAL SUSPENSION: 25.0000 mg | Freq: Two times a day (BID) | ORAL | Status: DC

## 2019-03-18 MED ORDER — ADULT MULTIVITAMIN W/MINERALS CH: 1.0000 | ORAL_TABLET | Freq: Every day | ORAL | Status: DC

## 2019-03-18 MED ORDER — FOLIC ACID 1 MG PO TABS: 1.0000 mg | ORAL_TABLET | Freq: Every day | ORAL | Status: DC

## 2019-03-18 MED ORDER — DOCUSATE SODIUM 50 MG/5ML PO LIQD: 50.0000 mg | Freq: Every day | ORAL | 0 refills | Status: DC

## 2019-03-18 MED ORDER — ONDANSETRON HCL 4 MG PO TABS: 4.0000 mg | ORAL_TABLET | Freq: Three times a day (TID) | ORAL | Status: DC | PRN

## 2019-03-18 MED ORDER — THIAMINE HCL 100 MG PO TABS: 100.0000 mg | ORAL_TABLET | Freq: Every day | ORAL | Status: DC

## 2019-03-18 MED ORDER — POLYETHYLENE GLYCOL 3350 17 G PO PACK: 17.0000 g | PACK | Freq: Every day | ORAL | Status: DC

## 2019-03-18 MED ORDER — CLONAZEPAM 0.25 MG PO TBDP: 1.0000 mg | ORAL_TABLET | Freq: Two times a day (BID) | ORAL | Status: DC | PRN

## 2019-03-18 MED ORDER — PANTOPRAZOLE SODIUM 20 MG PO TBEC: 20.0000 mg | DELAYED_RELEASE_TABLET | Freq: Every day | ORAL | Status: DC

## 2019-03-18 MED ORDER — ACETAMINOPHEN 160 MG/5ML PO SOLN: 650.0000 mg | Freq: Four times a day (QID) | ORAL | 0 refills | Status: DC | PRN

## 2019-03-18 MED ORDER — ACETAMINOPHEN 160 MG/5ML PO SOLN: 650.0000 mg | Freq: Four times a day (QID) | ORAL | Status: DC | PRN

## 2019-03-18 MED ORDER — HYDROCHLOROTHIAZIDE 12.5 MG PO CAPS: 25.0000 mg | ORAL_CAPSULE | Freq: Every day | ORAL | Status: DC

## 2019-03-18 MED ORDER — VITAMIN B-1 100 MG PO TABS: 100.0000 mg | ORAL_TABLET | Freq: Every day | ORAL | Status: DC

## 2019-03-18 MED ORDER — AMLODIPINE BESYLATE 5 MG PO TABS: 5.0000 mg | ORAL_TABLET | Freq: Every day | ORAL | Status: DC

## 2019-03-18 MED ORDER — PRO-STAT SUGAR FREE PO LIQD: 30.0000 mL | Freq: Two times a day (BID) | ORAL | 0 refills | Status: DC

## 2019-03-18 MED ORDER — DOCUSATE SODIUM 50 MG/5ML PO LIQD: 50.0000 mg | Freq: Every day | ORAL | Status: DC

## 2019-03-18 MED ORDER — OSMOLITE 1.5 CAL PO LIQD: 474.0000 mL | Freq: Three times a day (TID) | ORAL | 0 refills | Status: DC

## 2019-03-18 MED ORDER — CHLORHEXIDINE GLUCONATE 0.12% ORAL RINSE (MEDLINE KIT): 15.0000 mL | Freq: Two times a day (BID) | OROMUCOSAL | 0 refills | Status: DC

## 2019-03-18 MED ORDER — OXYCODONE HCL 5 MG PO TABS: 5.0000 mg | ORAL_TABLET | ORAL | Status: DC | PRN

## 2019-03-18 MED ORDER — CLONAZEPAM 1 MG PO TBDP: 1.0000 mg | ORAL_TABLET | Freq: Two times a day (BID) | ORAL | 0 refills | Status: DC | PRN

## 2019-03-18 MED ORDER — ONDANSETRON HCL 4 MG PO TABS: 4.0000 mg | ORAL_TABLET | Freq: Three times a day (TID) | ORAL | 0 refills | Status: DC | PRN

## 2019-03-18 MED ORDER — POLYETHYLENE GLYCOL 3350 17 G PO PACK: 17.0000 g | PACK | Freq: Every day | ORAL | 0 refills | Status: DC

## 2019-03-18 NOTE — Social Work (Signed)
Clinical Social Worker facilitated patient discharge including contacting patient family and facility to confirm patient discharge plans.  Clinical information faxed to facility and family agreeable with plan.  CSW arranged ambulance transport via [facility] to [place] at 3:30pm. RN to call [phone] with report prior to discharge.  Clinical Social Worker will sign off for now as social work intervention is no longer needed. Please consult [place] again if new need arises.  Westley Hummer, MSW, [place] Social Worker [phone]

## 2019-03-18 NOTE — Discharge Instructions (Signed)
All oral medications need to be crushed with puree   Full Liquid Diet A full liquid diet refers to fluids and foods that are liquid or will become liquid at room temperature. This diet should only be used for a short period of time to help you recover from illness or surgery. Your health care provider or dietitian will help you determine when it is safe to eat regular foods. What are tips for following this plan?     Reading food labels  Check food labels of nutrition shakes for the amount of protein. Look for nutrition shakes that have at least 8-10 grams of protein in each serving.  Look for drinks, such as milks and juices, that are "fortified" or "enriched." This means that vitamins and minerals have been added. Shopping  Buy premade nutritional shakes to keep on hand.  To vary your choices, buy different flavors of milks and shakes. Meal planning  Choose flavors and foods that you enjoy.  To make sure you get enough energy from food (calories): ? Eat 3 full liquid meals each day. Have a liquid snack between each meal. ? Drink 6-8 ounces (177-237 ml) of a nutrition supplement shake with meals or as snacks. ? Add protein powder, powdered milk, milk, or yogurt to shakes to increase the amount of protein.  Drink at least one serving a day of citrus fruit juice or fruit juice that has vitamin C added. General guidelines  Before starting the full-liquid diet, check with your health care provider to know what foods you should avoid. These may include full-fat or high-fiber liquids.  You may have any liquid or food that becomes a liquid at room temperature. The food is considered a liquid if it can be poured off a spoon at room temperature.  Do not drink alcohol unless approved by your health care provider.  This diet gives you most of the nutrients that you need for energy, but you may not get enough of certain vitamins, minerals, and fiber. Make sure to talk to your health care  provider or dietitian about: ? How many calories you need to eat get day. ? How much fluid you should have each day. ? Taking a multivitamin or a nutritional supplement. What foods are allowed? The items listed may not be a complete list. Talk with your dietitian about what dietary choices are best for you. Grains Thin hot cereal, such as cream of wheat. Soft-cooked pasta or rice pured in soup. Vegetables Pulp-free tomato or vegetable juice. Vegetables pured in soup. Fruits Fruit juice without pulp. Strained fruit pures (seeds and skins removed). Meats and other protein foods Beef, chicken, and fish broths. Powdered protein supplements. Dairy Milk and milk-based beverages, including milk shakes and instant breakfast mixes. Smooth yogurt. Pured cottage cheese. Beverages Water. Coffee and tea (caffeinated or decaffeinated). Cocoa. Liquid nutritional supplements. Soft drinks. Nondairy milks, such as almond, coconut, rice, or soy milk. Fats and oils Melted margarine and butter. Cream. Canola, almond, avocado, corn, grapeseed, sunflower, and sesame oils. Gravy. Sweets and desserts Custard. Pudding. Flavored gelatin. Smooth ice cream (without nuts or candy pieces). Sherbet. Popsicles. [place] ice. Pudding pops. Seasoning and other foods Salt and pepper. Spices. Cocoa powder. Vinegar. Ketchup. Yellow mustard. Smooth sauces, such as Hollandaise, cheese sauce, or white sauce. Soy sauce. Cream soups. Strained soups. Syrup. Honey. Jelly (without fruit pieces). What foods are not allowed? The items listed may not be a complete list. Talk with your dietitian about what dietary choices are best  for you. Grains Whole grains. Pasta. Rice. Cold cereal. Bread. Crackers. Vegetables All whole fresh, frozen, or canned vegetables. Fruits All whole fresh, frozen, or canned fruits. Meats and other protein foods All cuts of meat, poultry, and fish. Eggs. Tofu and soy protein. Nuts and nut butters. Lunch  meat. Sausage. Dairy Hard cheese. Yogurt with fruit chunks. Fats and oils Coconut oil. Palm oil. Lard. Cold butter. Sweets and desserts Ice cream or other frozen desserts that have any solids in them or on top, such as nuts, chocolate chips, and pieces of cookies. Cakes. Cookies. Candy. Seasoning and other foods Stone-ground mustards. Soups with chunks or pieces. Summary  A full liquid diet refers to fluids and foods that are liquid or will become liquid at room temperature.  This diet should only be used for a short period of time to help you recover from illness or surgery. Ask your health care provider or dietitian when it is safe for you to eat regular foods.  To make sure you get enough calories and nutrients, eat 3 meals each day with snacks between. Drink premade nutrition supplement shakes or add protein powder to homemade shakes. Take a vitamin and mineral supplement as told by your health care provider. This information is not intended to replace advice given to you by your health care provider. Make sure you discuss any questions you have with your health care provider. Document Released: 09/10/2005 Document Revised: 10/24/2016 Document Reviewed: 10/24/2016 Elsevier Interactive Patient Education  2019 [employer]

## 2019-03-18 NOTE — TOC Transition Note (Signed)
Transition of Care [facility]) - CM/SW Discharge Note   Patient Details  Name: Jose [name] MRN: [id number] Date of Birth: 08-01-1961  [facility] CM/SW Contact:  Alexander Mt, [place] Phone Number: 03/18/2019, 11:21 AM   Clinical Narrative:    Pt aware discharge to [facility] under 30 day LOG is today. COVID negative test received, paperwork for SNF completed with pt (CSW verbally read through paperwork with pt and he will have a copy to take with him). Pt brother contacted and aware, pt will need some clothing to take with him as his clothing will be brought to SNF on Saturday morning.   Await dc summary and order, any medications for prescriptions that are controlled need to be printed and signed to accompany pt.   Final next level of care: Skilled Nursing Facility Barriers to Discharge: Barriers Resolved   Patient Goals and CMS Choice Patient states their goals for this hospitalization and ongoing recovery are:: to do 30 days of rehab and be able to go home CMS Medicare.gov Compare Post Acute Care list provided to:: Other (Comment Required)(pt and pt brother; LOG placement) Choice offered to / list presented to : Patient  Discharge Placement PASRR number recieved: 03/17/19            Patient chooses bed at: Other - please specify in the comment section below:([facility]) Patient to be transferred to facility by: [facility] Name of family member notified: pt brother Elenore Rota Patient and family notified of of transfer: 03/18/19  Discharge Plan and Services In-house Referral: Clinical Social Work, Conservation officer, nature Services: [employer] Consult Post Acute Care Choice: [facility]                               Social Determinants of Health (SDOH) Interventions     Readmission Risk Interventions No flowsheet data found.
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
## 2019-03-18 NOTE — Progress Notes (Signed)
Report called to [facility]. Spoke with Stanton Kidney RN to give report. All paperwork prepared and given the transport. Pt is aware of transfer and is stable at this time with no acute changes. Simmie Davies RN
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
## 2019-03-18 NOTE — TOC Transition Note (Signed)
Transition of Care [facility]) - CM/SW Discharge Note   Patient Details  Name: DIAR BERKEL MRN: [id number] Date of Birth: 1961/09/04  Transition [facility] Care [facility] CM/SW Contact:  Alexander Mt, Ray Phone Number: 03/18/2019, 12:53 PM   Clinical Narrative:    Pt stable for dc, all paperwork complete.  Pt aware of dc, will be sent with additional trach supplies per RN staff. Will provide pt with some clothing prior to dc.   Final next level of care: Skilled Nursing Facility Barriers to Discharge: Barriers Resolved   Patient Goals and CMS Choice Patient states their goals for this hospitalization and ongoing recovery are:: to do 30 days of rehab and be able to go home CMS Medicare.gov Compare Post Acute Care list provided to:: Other (Comment Required)(pt and pt brother; LOG placement) Choice offered to / list presented to : Patient  Discharge Placement PASRR number recieved: 03/17/19            Patient chooses bed at: Other - please specify in the comment section below:([facility]) Patient to be transferred to facility by: [place] Name of family member notified: pt brother Elenore Rota Patient and family notified of of transfer: 03/18/19  Discharge Plan and Services In-house Referral: Clinical Social Work, Conservation officer, nature Services: [employer] Consult Post Acute Care Choice: [facility]            Social Determinants of Health (SDOH) Interventions     Readmission Risk Interventions No flowsheet data found.

## 2019-04-19 IMAGING — CR DG CHEST 2V
2 series · 2 of 2 positions shown · non-contrast
Comparison: None.

CLINICAL DATA: Patient is intoxicated and began having chest pain
yesterday.

EXAM:
CHEST - 2 VIEW

[chest pa]
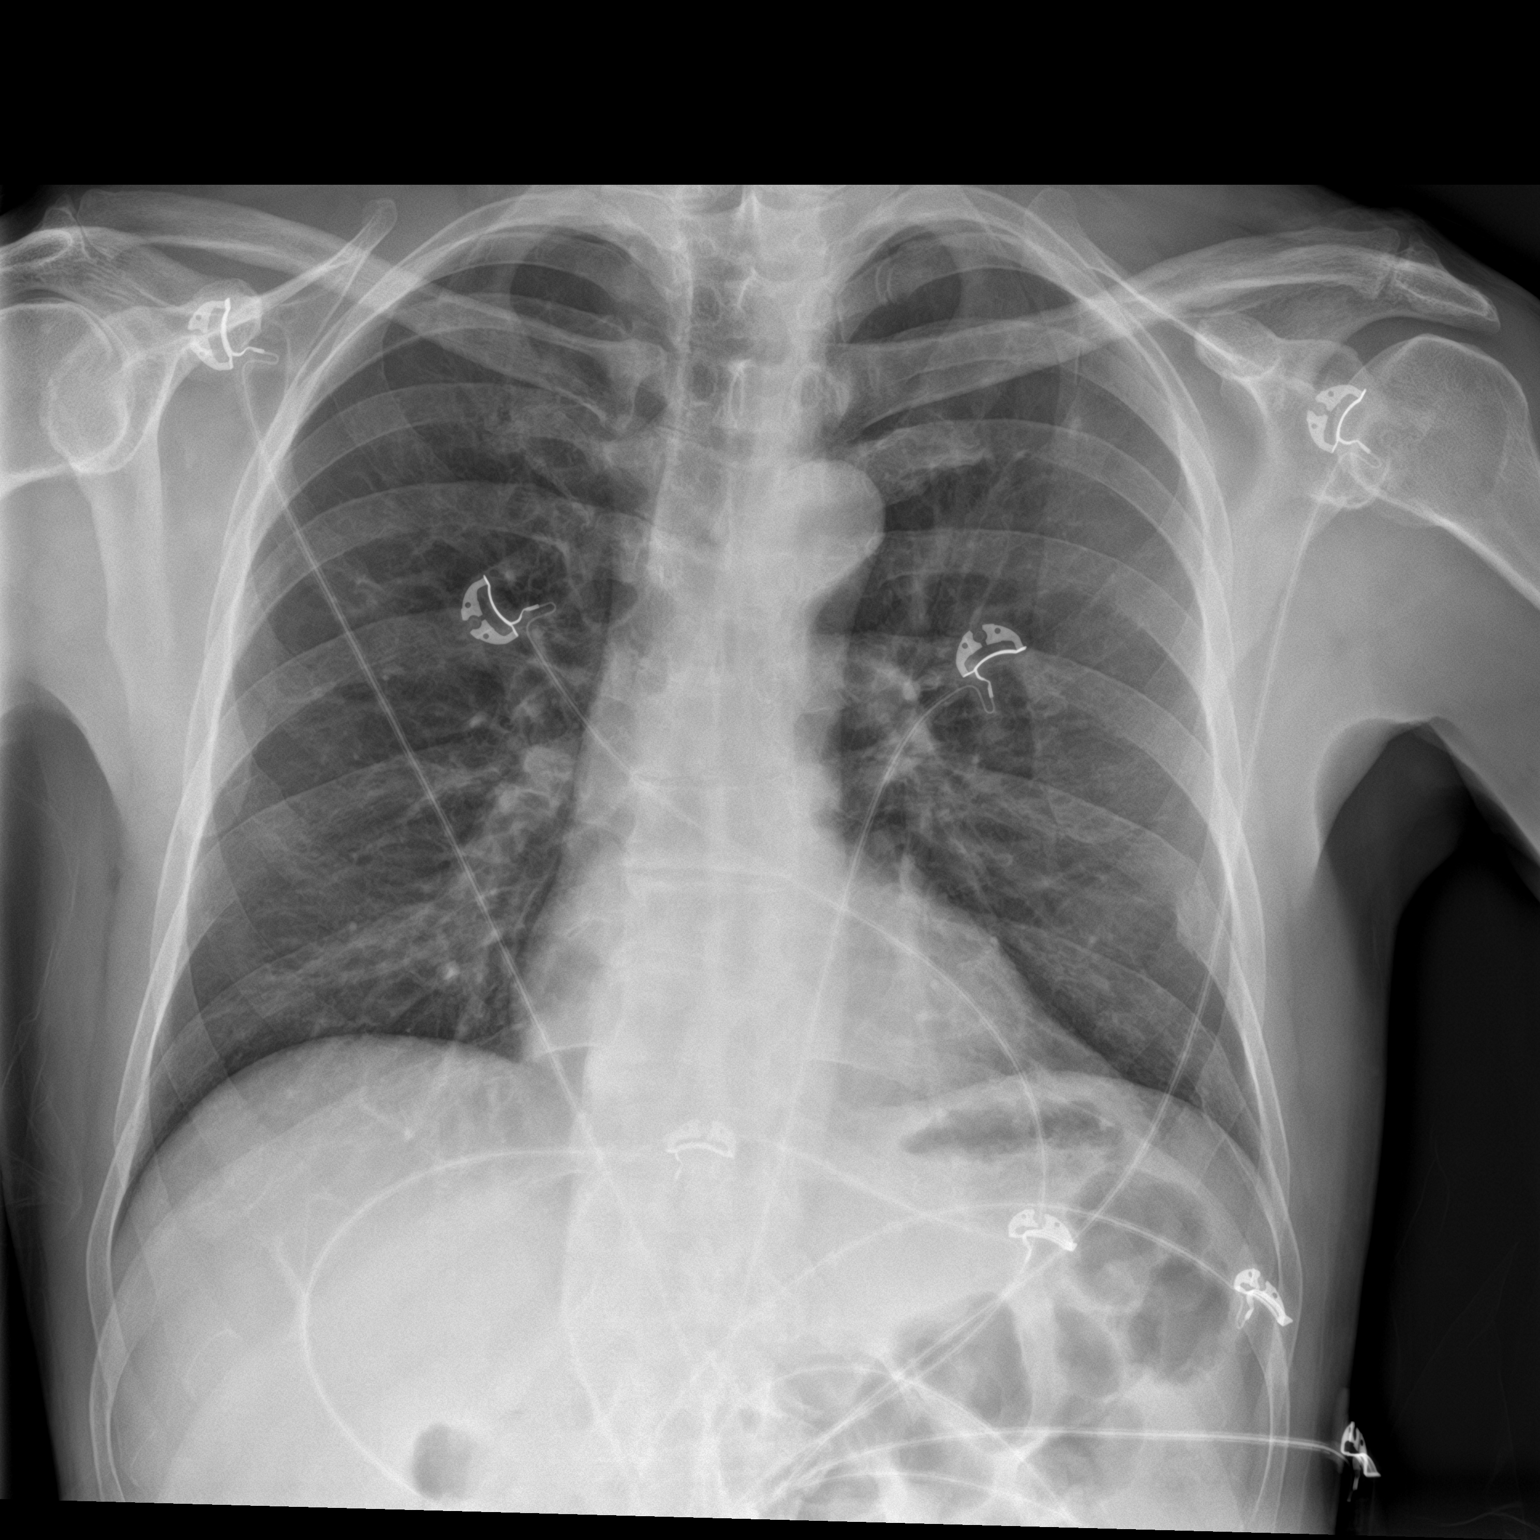

[chest lat]
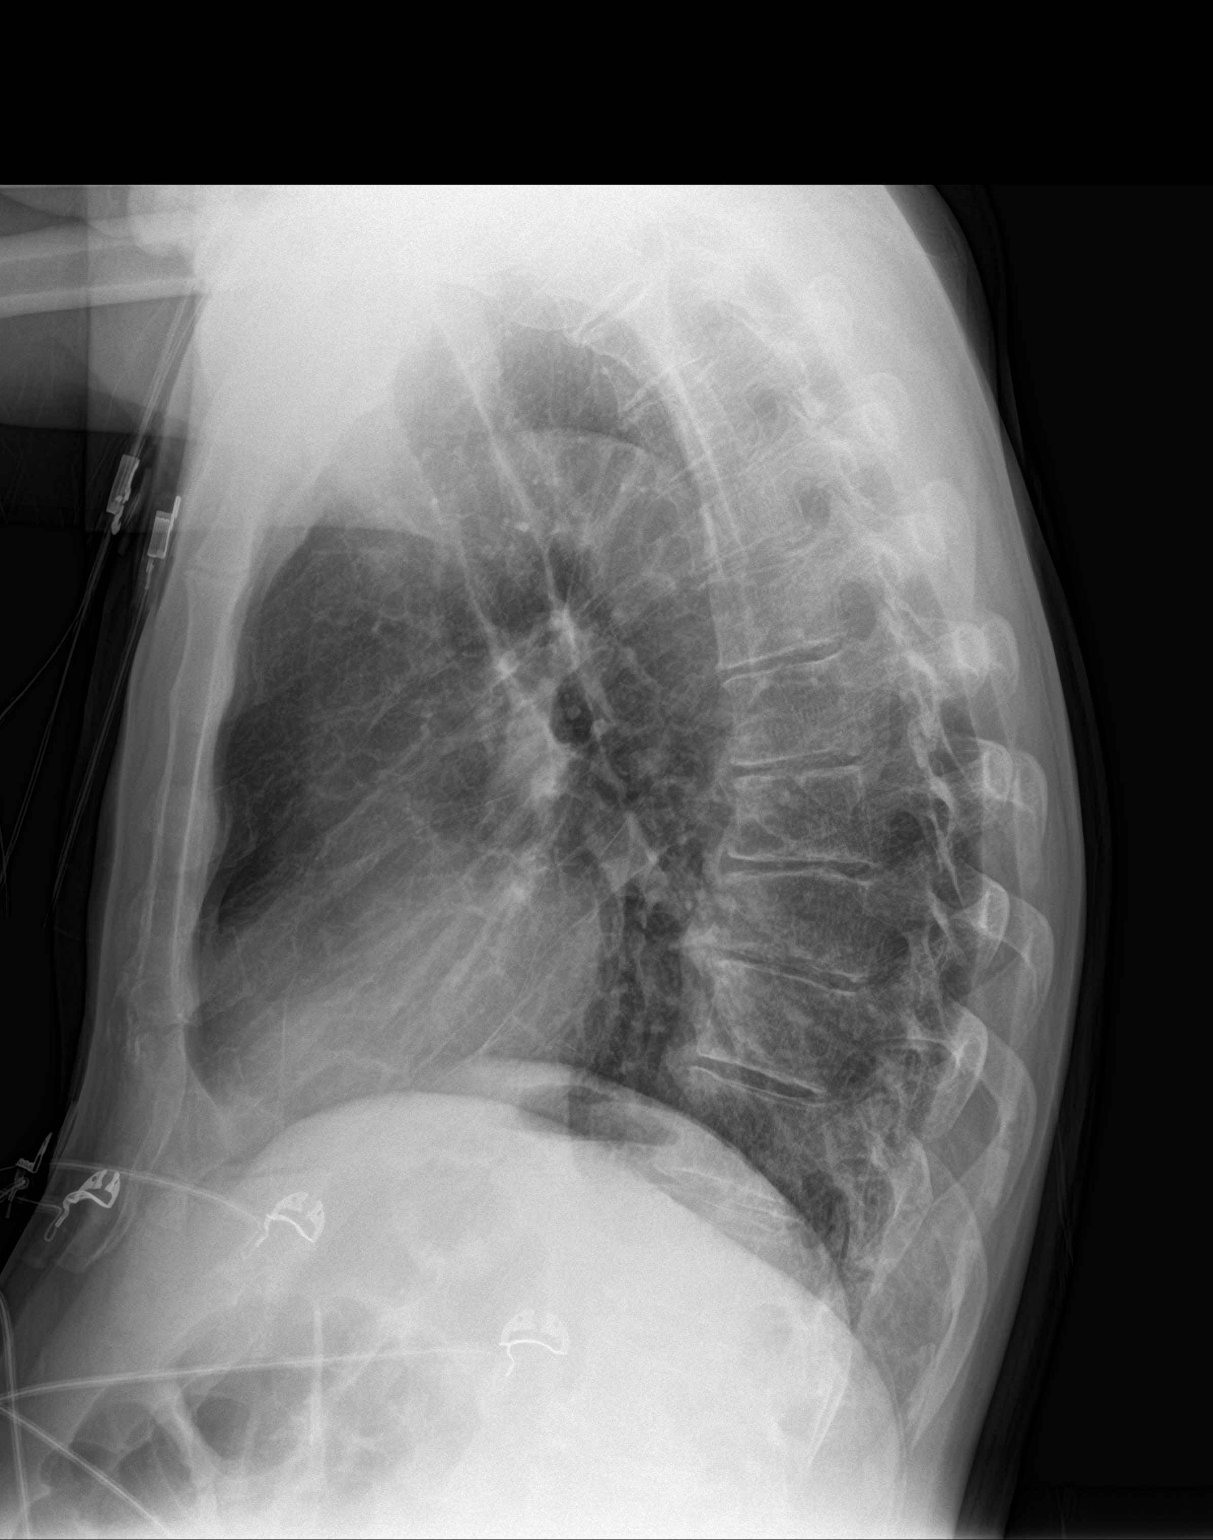

[2 of 2 positions shown; findings below may reference images not displayed]

FINDINGS: The heart size and mediastinal contours are within normal limits.
The thoracic aorta is atherosclerotic and tortuous in appearance
without aneurysm. Tiny granuloma or bone island projects over the
anterior left second rib. Lungs are free of pulmonary
consolidations. No CHF, effusion or pneumothorax. Remote left eighth
and ninth healed rib fractures. No acute osseous abnormality.
IMPRESSION: No active cardiopulmonary disease. Aortic atherosclerosis without
aneurysm. Remote left-sided rib fractures.

## 2019-04-27 ENCOUNTER — Other Ambulatory Visit (HOSPITAL_COMMUNITY): Payer: Self-pay | Admitting: Family Medicine

## 2019-04-27 DIAGNOSIS — R1312 Dysphagia, oropharyngeal phase: Secondary | ICD-10-CM

## 2019-04-28 ENCOUNTER — Other Ambulatory Visit: Payer: Self-pay | Admitting: Acute Care

## 2019-04-28 DIAGNOSIS — Z930 Tracheostomy status: Secondary | ICD-10-CM

## 2019-04-28 NOTE — Progress Notes (Unsigned)
Order placed for COVID-19 screen  Erick Colace ACNP-BC [place] Pager # [phone] OR # [phone] if no answer

## 2019-04-30 ENCOUNTER — Other Ambulatory Visit: Payer: Self-pay

## 2019-04-30 ENCOUNTER — Ambulatory Visit (HOSPITAL_COMMUNITY)
Admission: RE | Admit: 2019-04-30 | Discharge: 2019-04-30 | Disposition: A | Discharge status: Home / Self Care | Payer: Self-pay | Source: Ambulatory Visit | Attending: Acute Care | Admitting: Acute Care

## 2019-05-01 ENCOUNTER — Other Ambulatory Visit (HOSPITAL_COMMUNITY)
Admission: RE | Admit: 2019-05-01 | Discharge: 2019-05-01 | Disposition: A | Discharge status: Home / Self Care | Payer: Medicaid Other | Source: Ambulatory Visit | Attending: Acute Care | Admitting: Acute Care

## 2019-05-01 ENCOUNTER — Other Ambulatory Visit (HOSPITAL_COMMUNITY): Payer: Self-pay

## 2019-05-01 DIAGNOSIS — Z01812 Encounter for preprocedural laboratory examination: Secondary | ICD-10-CM | POA: Insufficient documentation

## 2019-05-01 DIAGNOSIS — R1310 Dysphagia, unspecified: Secondary | ICD-10-CM | POA: Insufficient documentation

## 2019-05-01 DIAGNOSIS — Z20828 Contact with and (suspected) exposure to other viral communicable diseases: Secondary | ICD-10-CM | POA: Insufficient documentation

## 2019-05-01 DIAGNOSIS — K2970 Gastritis, unspecified, without bleeding: Secondary | ICD-10-CM | POA: Insufficient documentation

## 2019-05-02 LAB — SARS CORONAVIRUS 2 (TAT 6-24 HRS): SARS Coronavirus 2: NEGATIVE

## 2019-05-04 ENCOUNTER — Encounter (HOSPITAL_COMMUNITY): Payer: Self-pay | Admitting: Physician Assistant

## 2019-05-04 ENCOUNTER — Ambulatory Visit (HOSPITAL_COMMUNITY)
Admission: RE | Admit: 2019-05-04 | Discharge: 2019-05-04 | Disposition: A | Discharge status: Home / Self Care | Payer: Medicaid Other | Source: Ambulatory Visit | Attending: Family Medicine | Admitting: Family Medicine

## 2019-05-04 ENCOUNTER — Other Ambulatory Visit: Payer: Self-pay

## 2019-05-04 DIAGNOSIS — Z431 Encounter for attention to gastrostomy: Secondary | ICD-10-CM | POA: Diagnosis not present

## 2019-05-04 DIAGNOSIS — R1312 Dysphagia, oropharyngeal phase: Secondary | ICD-10-CM | POA: Diagnosis not present

## 2019-05-04 HISTORY — PX: IR GASTROSTOMY TUBE REMOVAL: IMG5492

## 2019-05-04 MED ORDER — LIDOCAINE VISCOUS HCL 2 % MT SOLN
OROMUCOSAL | Status: AC
  Filled 2019-05-04: qty 15

## 2019-05-04 NOTE — Procedures (Signed)
Pre-procedure diagnosis: Dysphagia Post-procedure diagnosis: Same  Successful bedside removal of intact pull through G-tube. No immediate post procedural complications. EBL: zero  Please see imaging section of Epic for full dictation.  Joaquim Nam 05/04/2019 10:41 AM

## 2019-05-07 ENCOUNTER — Ambulatory Visit (HOSPITAL_COMMUNITY)
Admission: RE | Admit: 2019-05-07 | Discharge: 2019-05-07 | Disposition: A | Discharge status: Home / Self Care | Payer: Medicaid Other | Source: Ambulatory Visit | Attending: Acute Care | Admitting: Acute Care

## 2019-05-07 ENCOUNTER — Other Ambulatory Visit: Payer: Self-pay

## 2019-05-07 DIAGNOSIS — I10 Essential (primary) hypertension: Secondary | ICD-10-CM | POA: Insufficient documentation

## 2019-05-07 DIAGNOSIS — Z930 Tracheostomy status: Secondary | ICD-10-CM

## 2019-05-07 DIAGNOSIS — Z430 Encounter for attention to tracheostomy: Secondary | ICD-10-CM | POA: Insufficient documentation

## 2019-05-07 DIAGNOSIS — B182 Chronic viral hepatitis C: Secondary | ICD-10-CM | POA: Diagnosis not present

## 2019-05-07 DIAGNOSIS — Z87820 Personal history of traumatic brain injury: Secondary | ICD-10-CM | POA: Insufficient documentation
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
## 2019-05-07 NOTE — Progress Notes (Signed)
Tracheostomy Procedure Note  Jose [name] [id number] 28-Jan-1961  Pre Procedure Tracheostomy Information  Trach Brand: Shiley Size: 4.0  Style: Uncuffed Secured by: Velcro   Procedure: trach decannulation/removal    Post Procedure Tracheostomy Information  Trach Brand:  Size;   Style: Secured by:   Post Procedure Evaluation:  ETCO2 positive color change from yellow to purple : No.trach removed Vital signs:blood pressure N/A pulse 73, respirations 19 and pulse oximetry 100 % Patients current condition: stable Complications: No apparent complications Trach site exam: clean, dry, dressing applied Wound care done: cleaned, dressing applied Patient did tolerate procedure well.   Education: Patient instructed to keep bandage on stoma site, mayt take shower as long as  Stoma is covered with bandage.  Keep stoma covered with bandage until stoma is closed  Prescription needs:NONE    Additional needs:No followup necessary unless have problems with stoma healing
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
## 2019-07-06 IMAGING — CR DG CHEST 2V
1 series · 3 of 3 positions shown · non-contrast
Comparison: May 03, 2018

CLINICAL DATA: Chest pain and shortness of breath

EXAM:
CHEST - 2 VIEW

[Series 1: dg chest 2 view · 0.14mm/px · 3 of 3 slices shown]
[im 1/3]
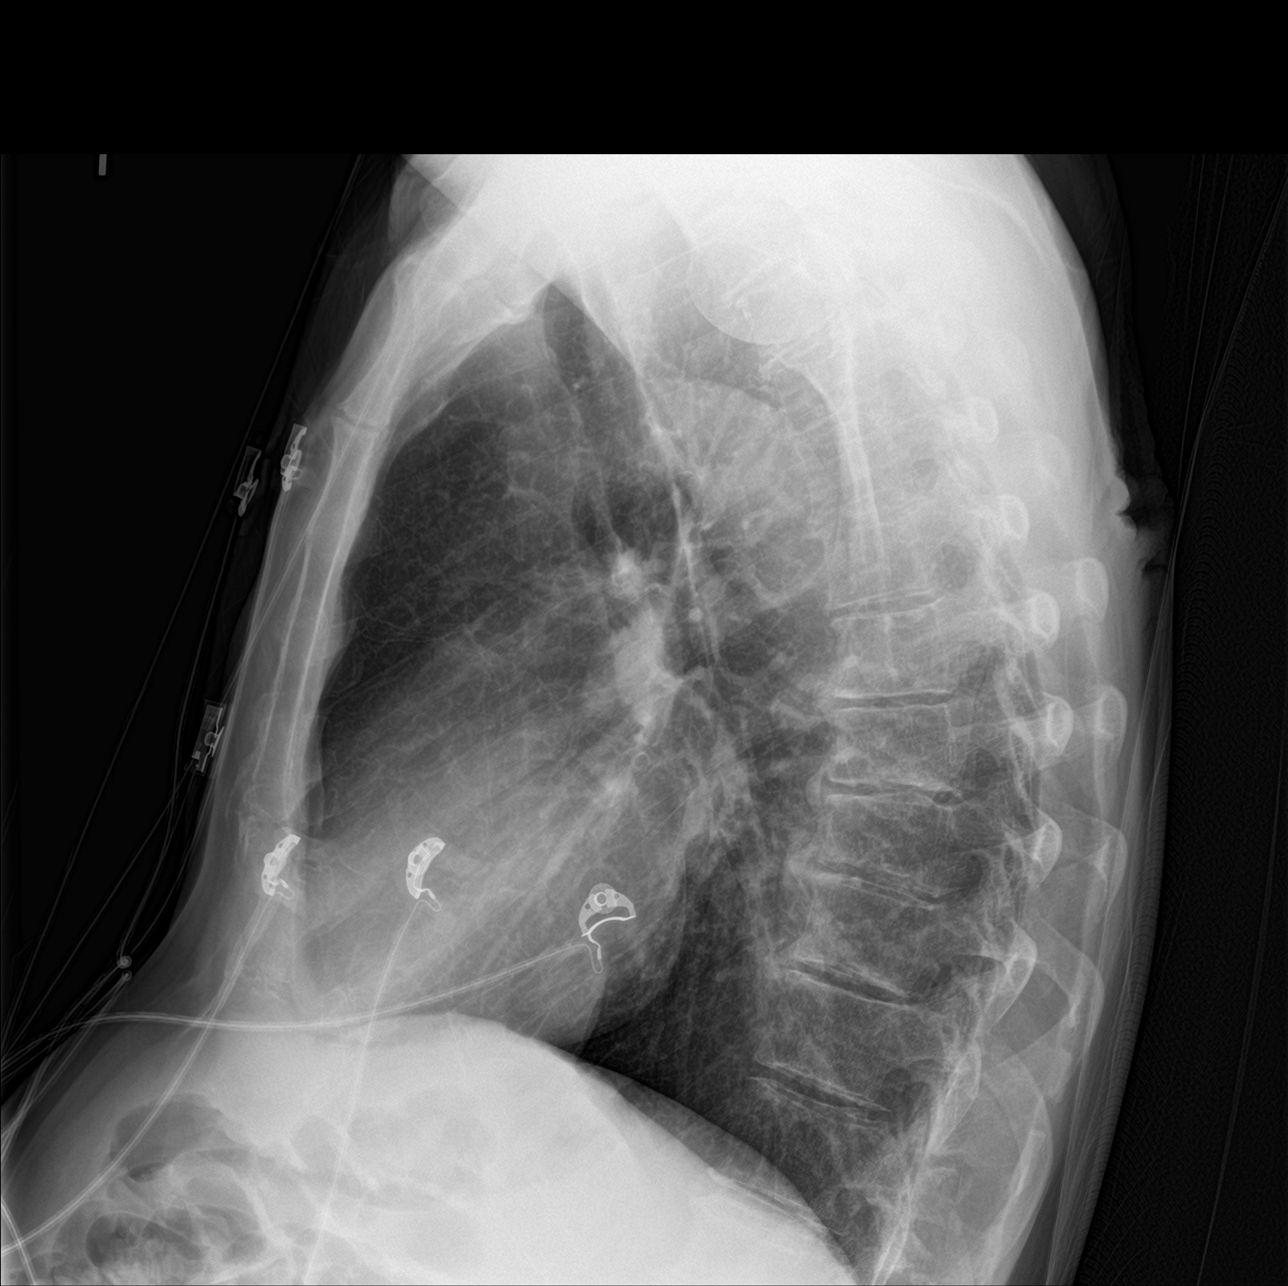
[im 2/3]
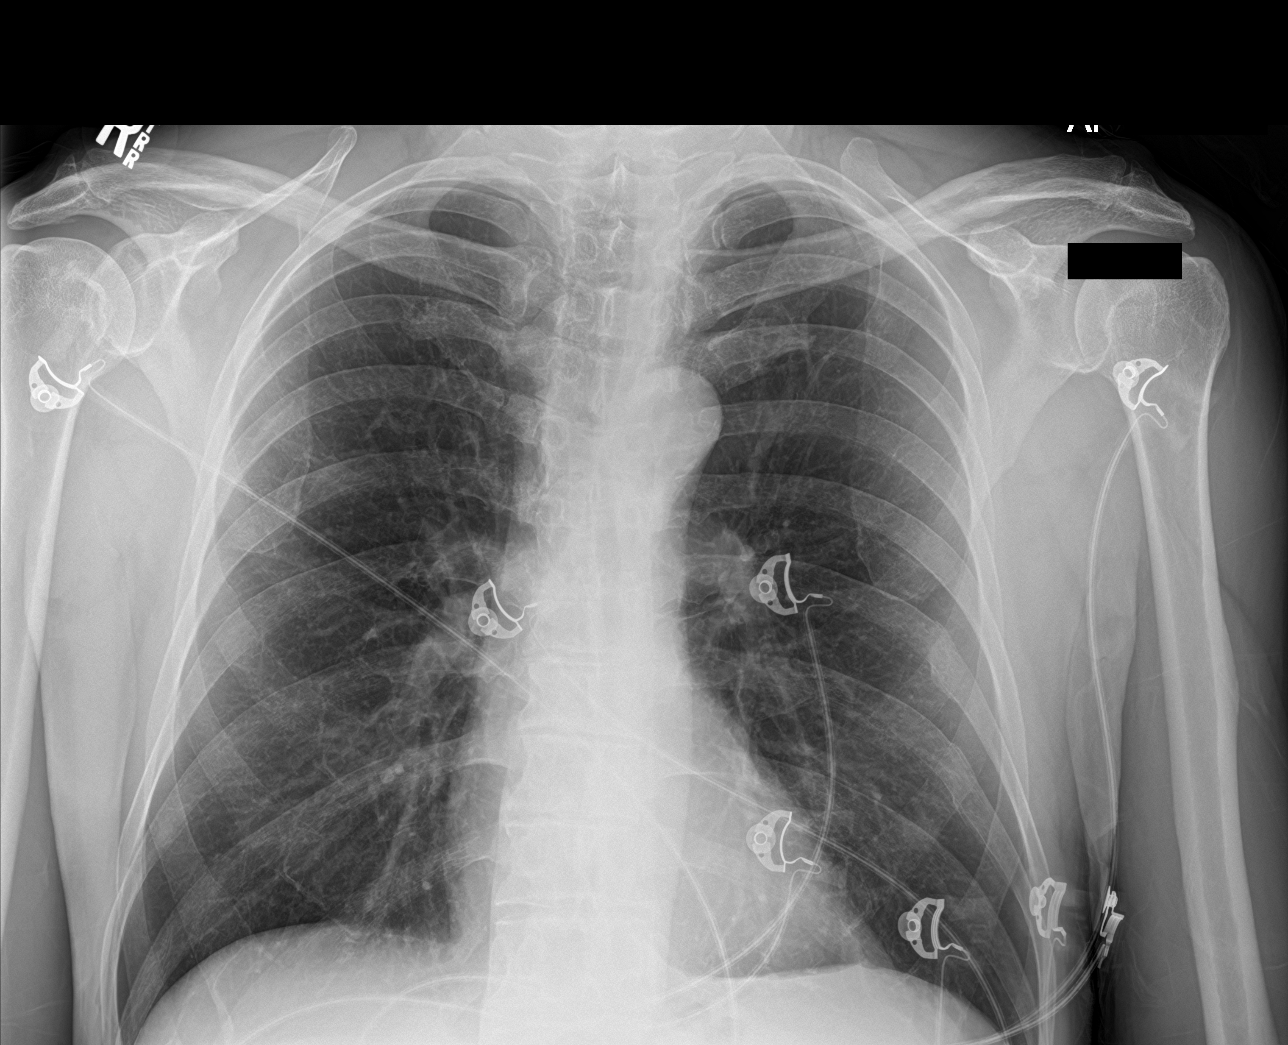
[im 3/3]
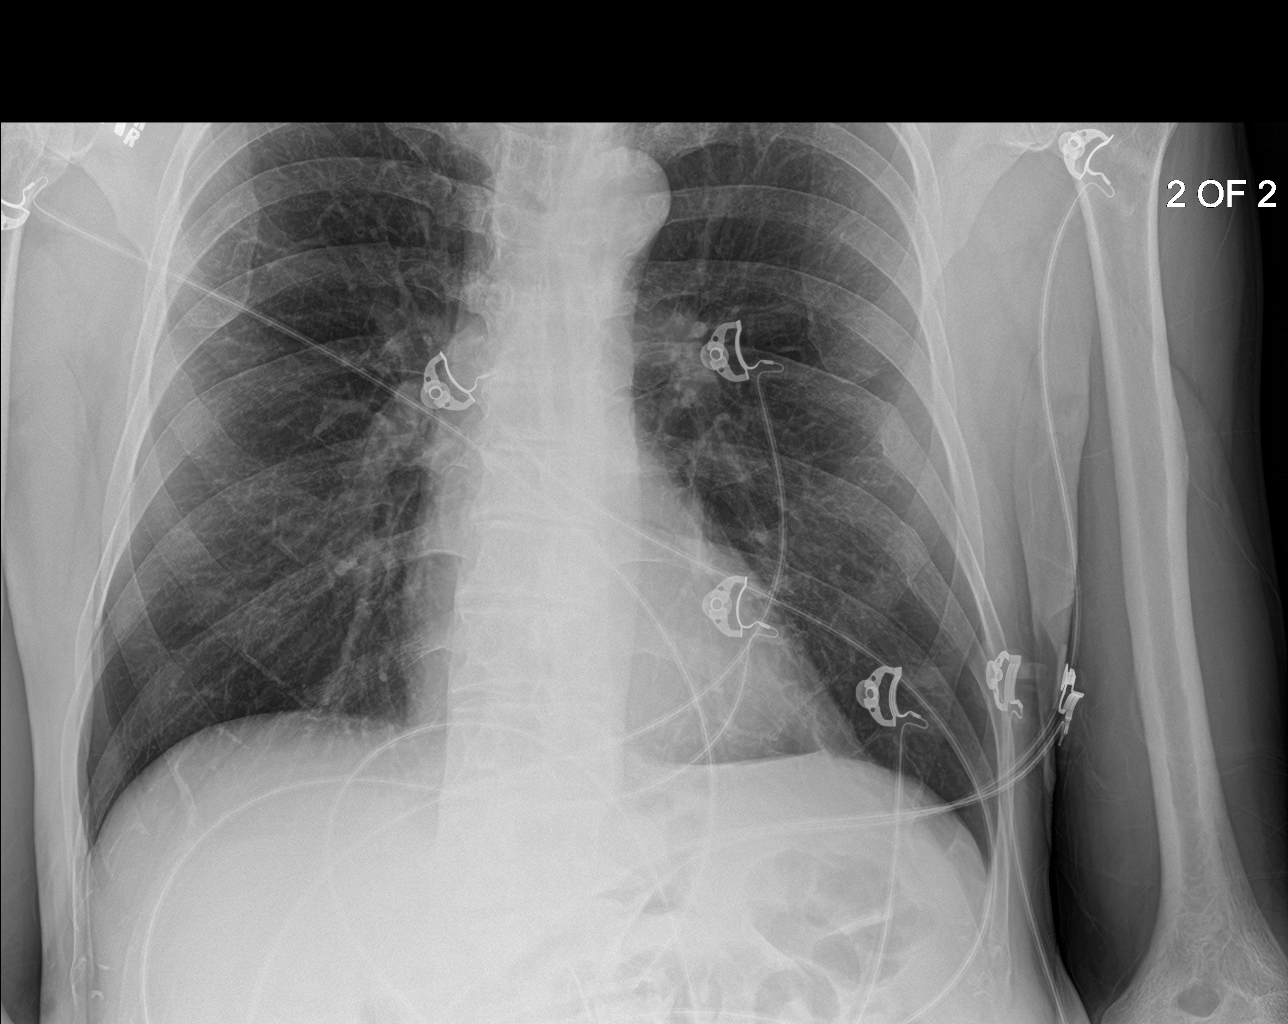

[3 of 3 positions shown; findings below may reference images not displayed]

FINDINGS: There is no edema or consolidation. Heart size and pulmonary
vascularity are normal. No adenopathy. No pneumothorax. There is
degenerative change in the midthoracic spine. There old healed rib
fractures on the left.
IMPRESSION: No edema or consolidation.

## 2019-07-06 IMAGING — CR DG HAND COMPLETE 3+V*L*
1 series · 3 of 3 positions shown · non-contrast
Comparison: None.

CLINICAL DATA: 57 y/o  M; left thumb pain.

EXAM:
LEFT HAND - COMPLETE 3+ VIEW

[Series 1: dg hand complete left · 0.14mm/px · 3 of 3 slices shown]
[im 1/3]
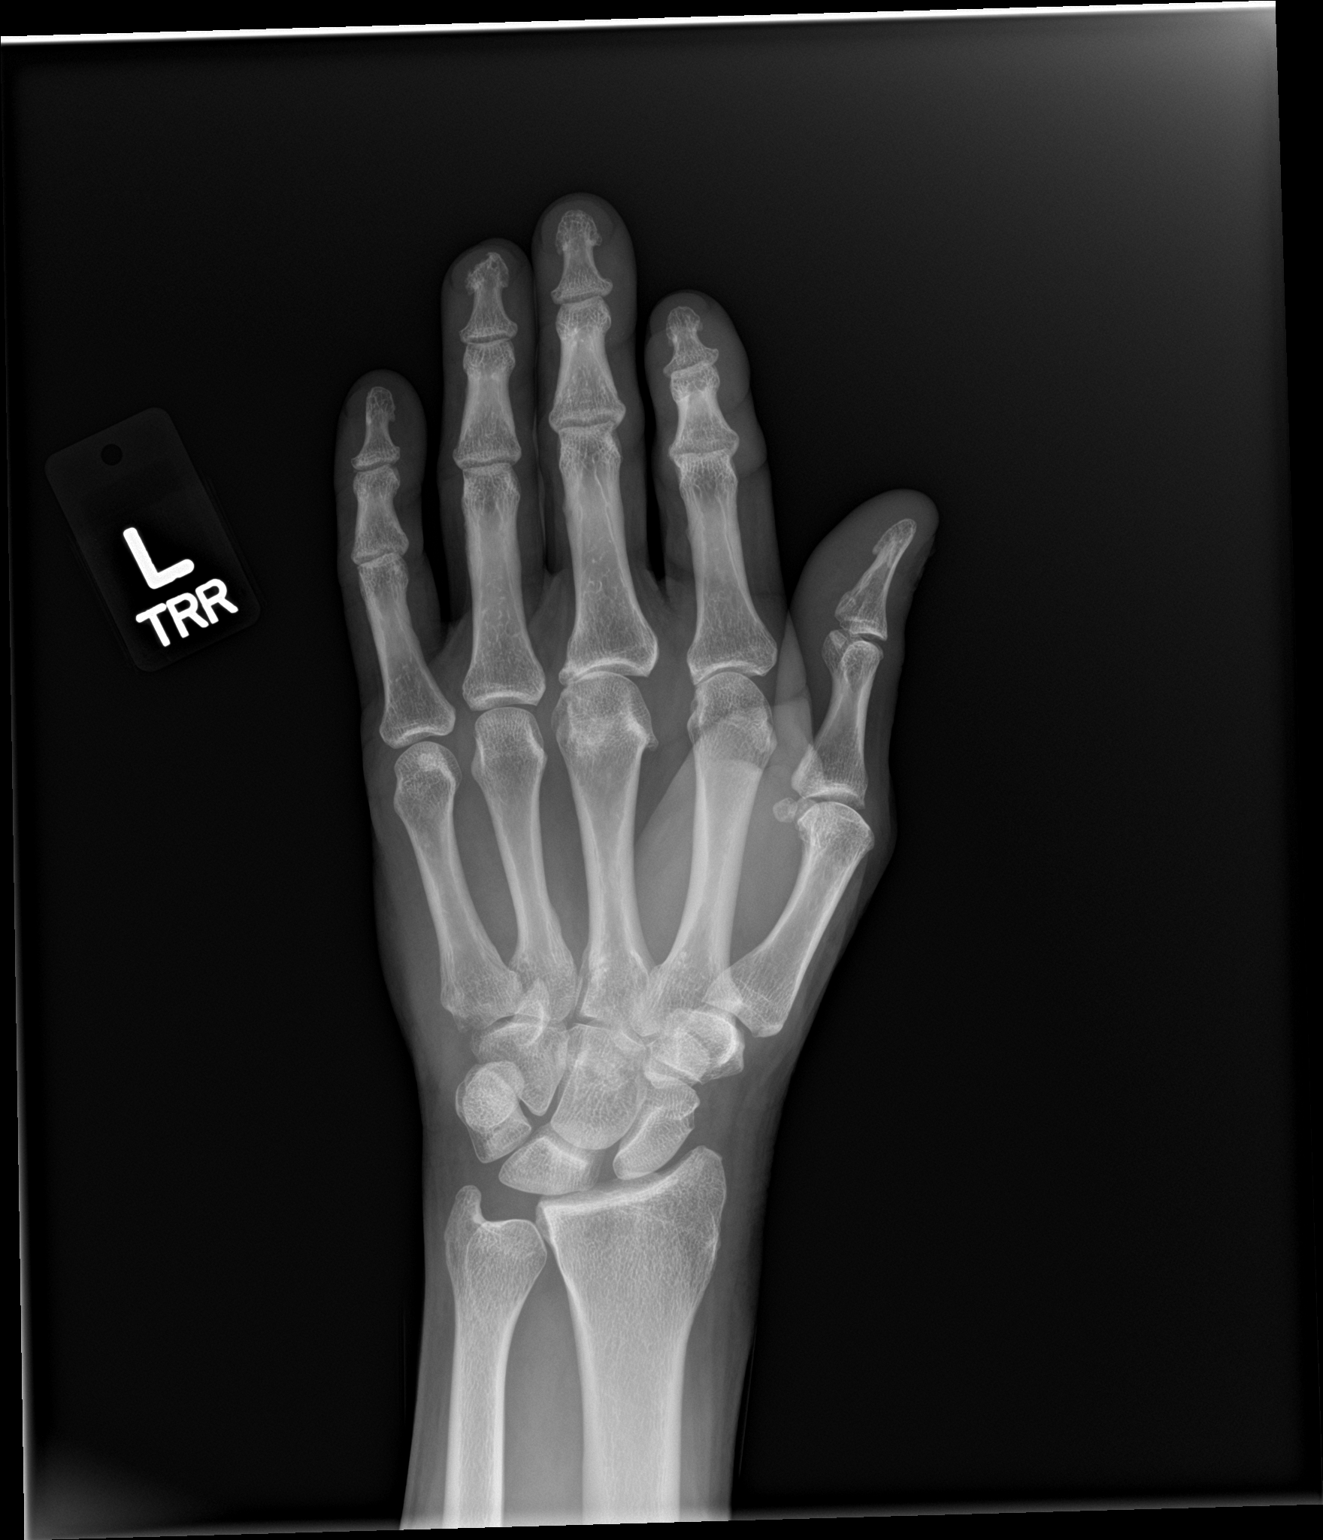
[im 2/3]
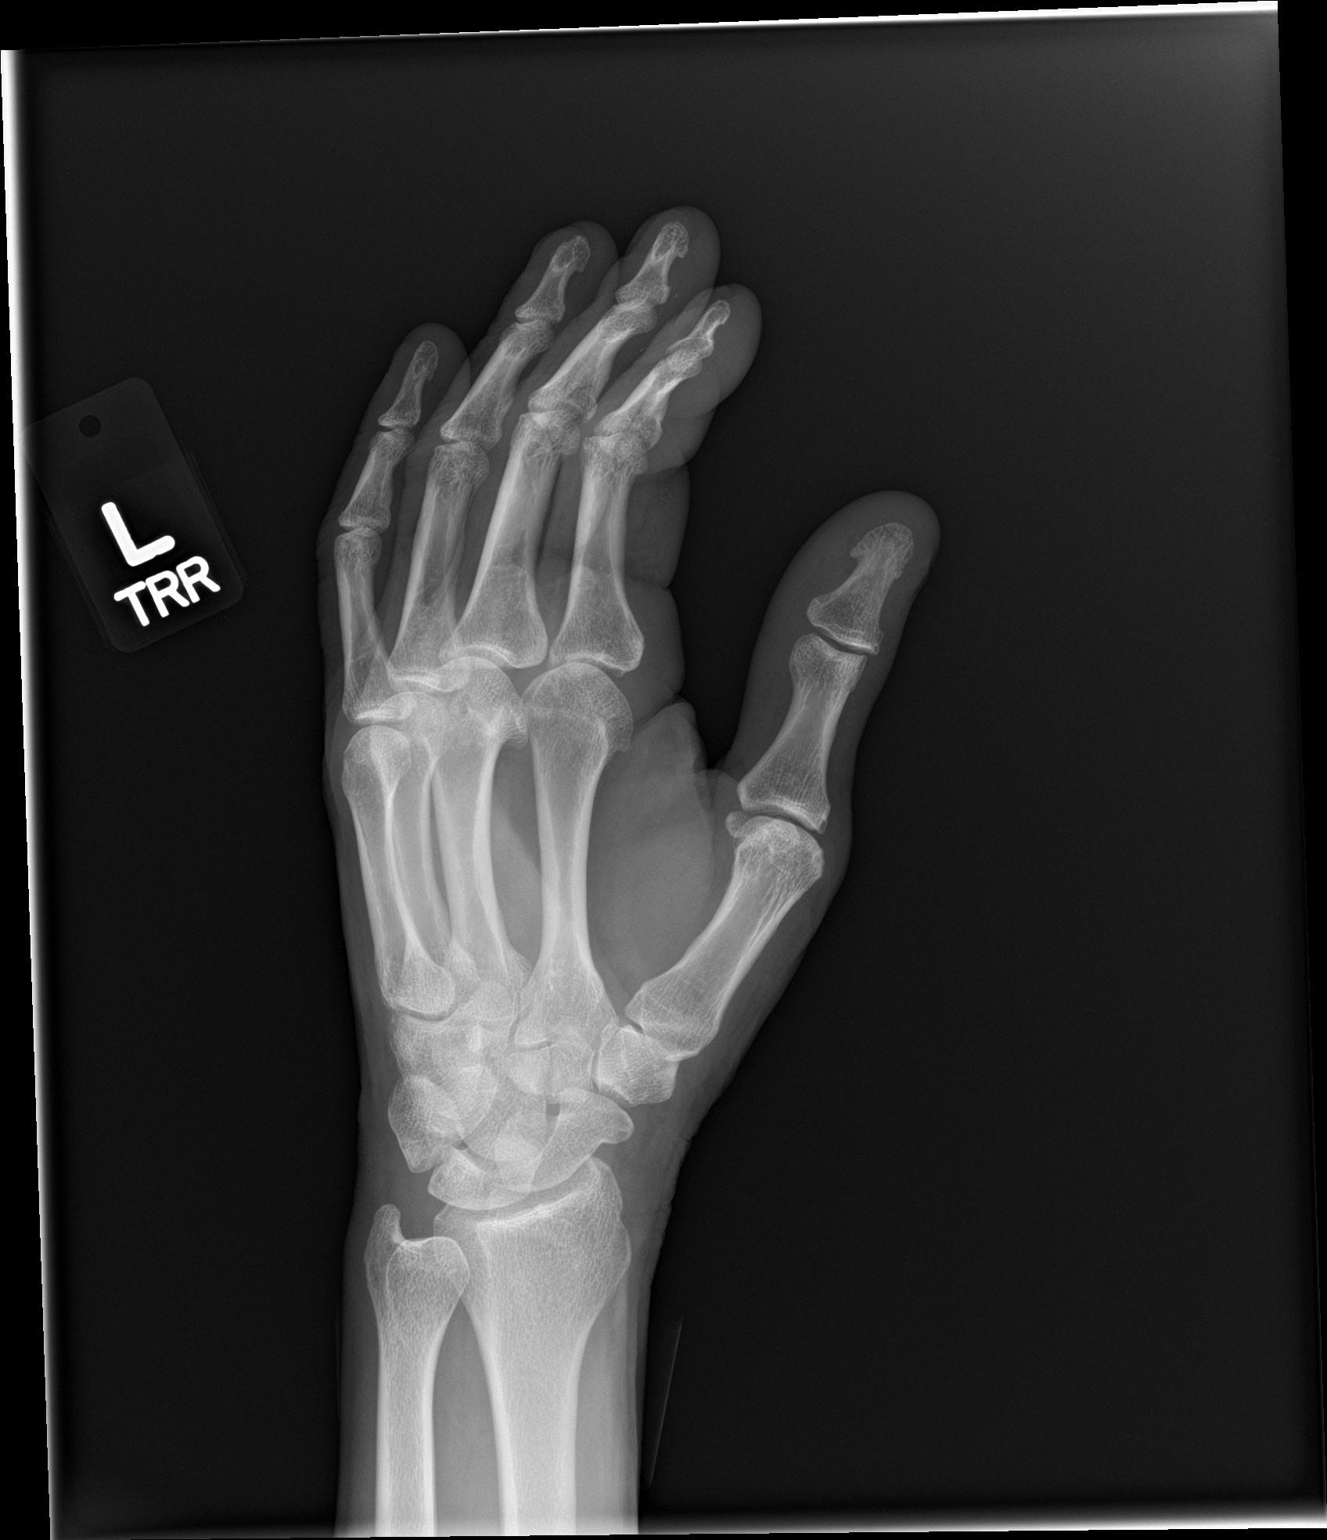
[im 3/3]
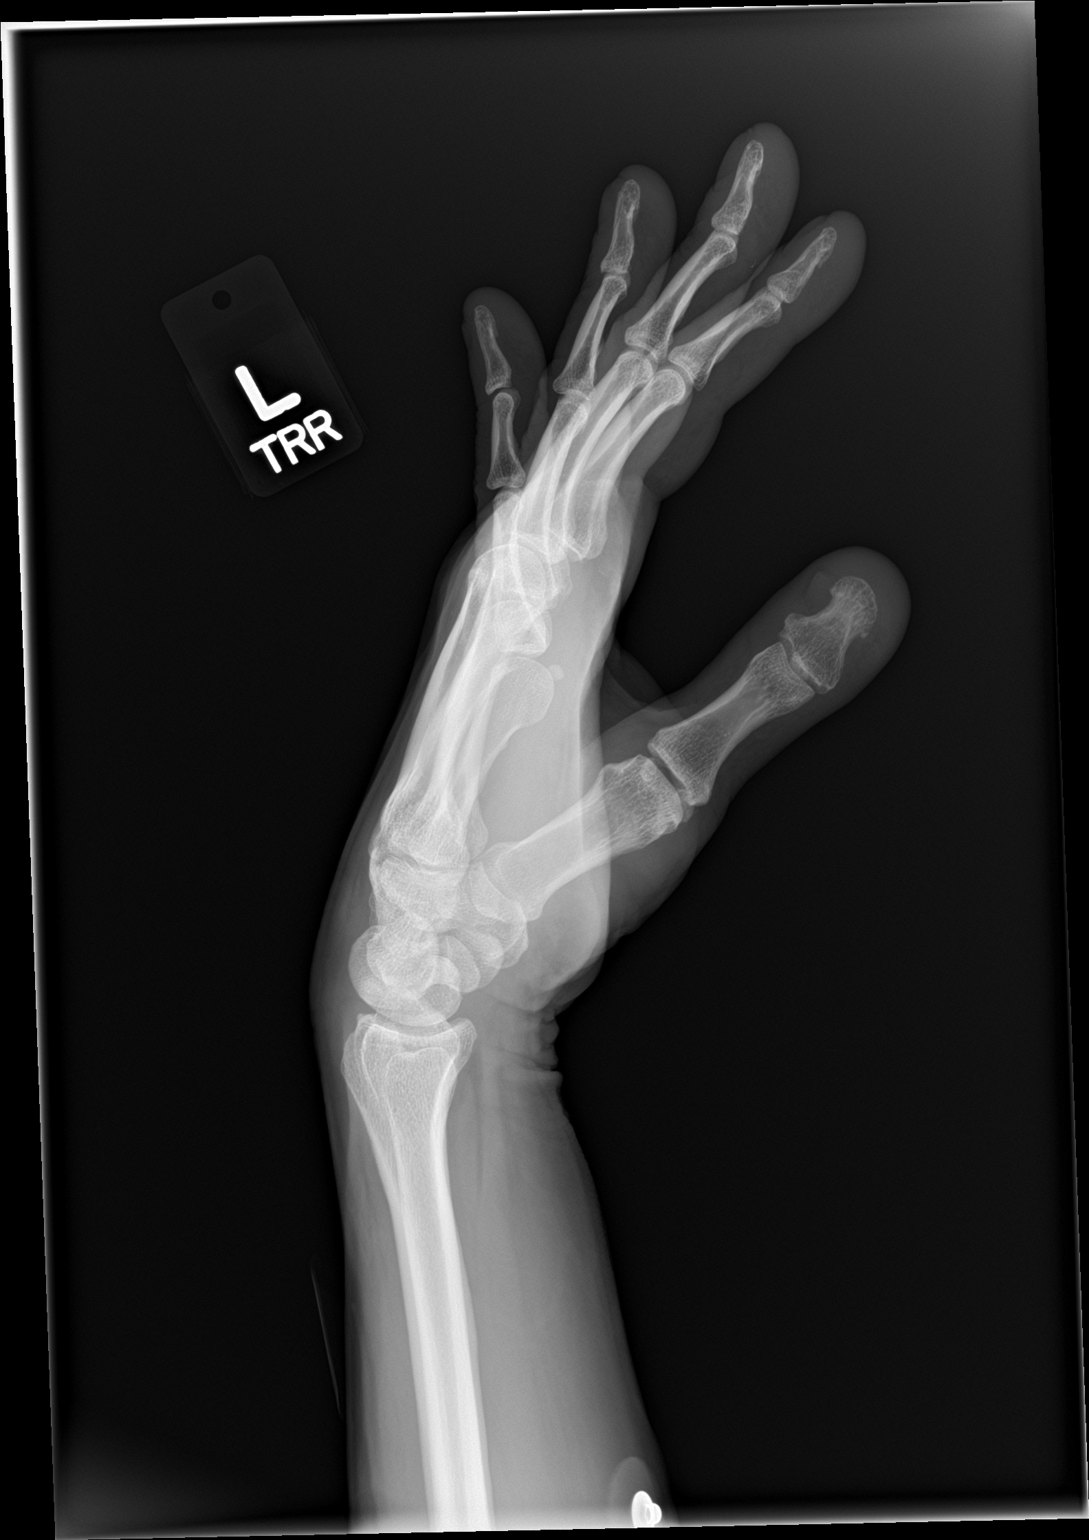

[3 of 3 positions shown; findings below may reference images not displayed]

FINDINGS: There is no evidence of fracture or dislocation. Mild osteoarthrosis
of the third metacarpophalangeal joint with loss of the joint space
and periarticular osteophytosis. Mild negative ulnar variance.
IMPRESSION: No acute fracture or dislocation identified.

By: Elvia Cn M.D.

## 2019-07-13 ENCOUNTER — Emergency Department
Admission: EM | Admit: 2019-07-13 | Discharge: 2019-07-13 | Disposition: A | Discharge status: Home / Self Care | Payer: Medicaid Other | Attending: Emergency Medicine | Admitting: Emergency Medicine

## 2019-07-13 ENCOUNTER — Encounter: Payer: Self-pay | Admitting: Emergency Medicine

## 2019-07-13 DIAGNOSIS — I10 Essential (primary) hypertension: Secondary | ICD-10-CM | POA: Insufficient documentation

## 2019-07-13 DIAGNOSIS — G8921 Chronic pain due to trauma: Secondary | ICD-10-CM | POA: Diagnosis not present

## 2019-07-13 DIAGNOSIS — R52 Pain, unspecified: Secondary | ICD-10-CM | POA: Diagnosis present

## 2019-07-13 DIAGNOSIS — F17200 Nicotine dependence, unspecified, uncomplicated: Secondary | ICD-10-CM | POA: Diagnosis not present

## 2019-07-13 DIAGNOSIS — Z79899 Other long term (current) drug therapy: Secondary | ICD-10-CM | POA: Insufficient documentation

## 2019-07-13 DIAGNOSIS — F1010 Alcohol abuse, uncomplicated: Secondary | ICD-10-CM | POA: Diagnosis not present

## 2019-07-13 LAB — COMPREHENSIVE METABOLIC PANEL
ALT: 35 U/L (ref 0–44)
AST: 59 U/L — ABNORMAL HIGH (ref 15–41)
Albumin: 4.4 g/dL (ref 3.5–5.0)
Alkaline Phosphatase: 106 U/L (ref 38–126)
Anion gap: 12 (ref 5–15)
BUN: 12 mg/dL (ref 6–20)
CO2: 25 mmol/L (ref 22–32)
Calcium: 9.5 mg/dL (ref 8.9–10.3)
Chloride: 101 mmol/L (ref 98–111)
Creatinine, Ser: 0.87 mg/dL (ref 0.61–1.24)
GFR calc Af Amer: >60 mL/min (ref >60)
GFR calc non Af Amer: >60 mL/min (ref >60)
Glucose, Bld: 83 mg/dL (ref 70–99)
Potassium: 4.1 mmol/L (ref 3.5–5.1)
Sodium: 138 mmol/L (ref 135–145)
Total Bilirubin: 0.6 mg/dL (ref 0.3–1.2)
Total Protein: 8.4 g/dL — ABNORMAL HIGH (ref 6.5–8.1)

## 2019-07-13 LAB — CBC
HCT: 38.2 % — ABNORMAL LOW (ref 39.0–52.0)
Hemoglobin: 12.9 g/dL — ABNORMAL LOW (ref 13.0–17.0)
MCH: 31.1 pg (ref 26.0–34.0)
MCHC: 33.8 g/dL (ref 30.0–36.0)
MCV: 92 fL (ref 80.0–100.0)
Platelets: 155 10*3/uL (ref 150–400)
RBC: 4.15 MIL/uL — ABNORMAL LOW (ref 4.22–5.81)
RDW: 15.2 % (ref 11.5–15.5)
WBC: 6.6 10*3/uL (ref 4.0–10.5)
nRBC: 0 % (ref 0.0–0.2)

## 2019-07-13 LAB — URINE DRUG SCREEN, QUALITATIVE (ARMC ONLY)
Amphetamines, Ur Screen: NOT DETECTED
Barbiturates, Ur Screen: NOT DETECTED
Benzodiazepine, Ur Scrn: NOT DETECTED
Cannabinoid 50 Ng, Ur ~~LOC~~: NOT DETECTED
Cocaine Metabolite,Ur ~~LOC~~: NOT DETECTED
MDMA (Ecstasy)Ur Screen: NOT DETECTED
Methadone Scn, Ur: NOT DETECTED
Opiate, Ur Screen: NOT DETECTED
Phencyclidine (PCP) Ur S: NOT DETECTED
Tricyclic, Ur Screen: NOT DETECTED

## 2019-07-13 LAB — ETHANOL: Alcohol, Ethyl (B): 304 mg/dL (ref <10)
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
## 2019-11-18 ENCOUNTER — Emergency Department: Payer: Medicaid Other

## 2019-11-18 ENCOUNTER — Encounter: Payer: Self-pay | Admitting: Emergency Medicine

## 2019-11-18 ENCOUNTER — Other Ambulatory Visit: Payer: Self-pay

## 2019-11-18 DIAGNOSIS — Z79899 Other long term (current) drug therapy: Secondary | ICD-10-CM | POA: Diagnosis not present

## 2019-11-18 DIAGNOSIS — Y939 Activity, unspecified: Secondary | ICD-10-CM | POA: Insufficient documentation

## 2019-11-18 DIAGNOSIS — S0083XA Contusion of other part of head, initial encounter: Secondary | ICD-10-CM | POA: Insufficient documentation

## 2019-11-18 DIAGNOSIS — Y929 Unspecified place or not applicable: Secondary | ICD-10-CM | POA: Diagnosis not present

## 2019-11-18 DIAGNOSIS — F17210 Nicotine dependence, cigarettes, uncomplicated: Secondary | ICD-10-CM | POA: Insufficient documentation

## 2019-11-18 DIAGNOSIS — I10 Essential (primary) hypertension: Secondary | ICD-10-CM | POA: Insufficient documentation

## 2019-11-18 DIAGNOSIS — S0990XA Unspecified injury of head, initial encounter: Secondary | ICD-10-CM | POA: Diagnosis not present

## 2019-11-18 DIAGNOSIS — Y999 Unspecified external cause status: Secondary | ICD-10-CM | POA: Diagnosis not present
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
## 2019-11-19 ENCOUNTER — Emergency Department
Admission: EM | Admit: 2019-11-19 | Discharge: 2019-11-19 | Disposition: A | Discharge status: Home / Self Care | Payer: Medicaid Other | Attending: Emergency Medicine | Admitting: Emergency Medicine

## 2019-11-19 DIAGNOSIS — S0990XA Unspecified injury of head, initial encounter: Secondary | ICD-10-CM

## 2019-11-19 DIAGNOSIS — S0083XA Contusion of other part of head, initial encounter: Secondary | ICD-10-CM

## 2019-11-19 MED ORDER — OXYCODONE-ACETAMINOPHEN 5-325 MG PO TABS
1.0000 | ORAL_TABLET | Freq: Once | ORAL | Status: AC
  Administered 2019-11-19: 1 via ORAL
  Filled 2019-11-19: qty 1

## 2019-11-19 NOTE — ED Notes (Signed)
Pt given phone to speak to brother
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
## 2019-11-19 NOTE — Discharge Instructions (Addendum)
Return to the ER for new, worsening, or persistent severe pain, severe headache, dizziness, vomiting, or any other new or worsening symptoms that concern you.  You may take Tylenol or ibuprofen over-the-counter as needed for pain.

## 2019-11-19 NOTE — ED Notes (Signed)
Pt given bus pass for d/c
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
## 2020-01-09 IMAGING — DX PORTABLE ABDOMEN - 1 VIEW
1 series · 2 of 2 positions shown · non-contrast
Comparison: Prior radiographs obtained earlier today at [DATE] a.m.

CLINICAL DATA: 57-year-old male undergoing readjustment of
nasogastric tube

EXAM:
PORTABLE ABDOMEN - 1 VIEW

[Series 1: abdomen kub · 0.14mm/px · 2 of 2 slices shown]
[im 1/2]
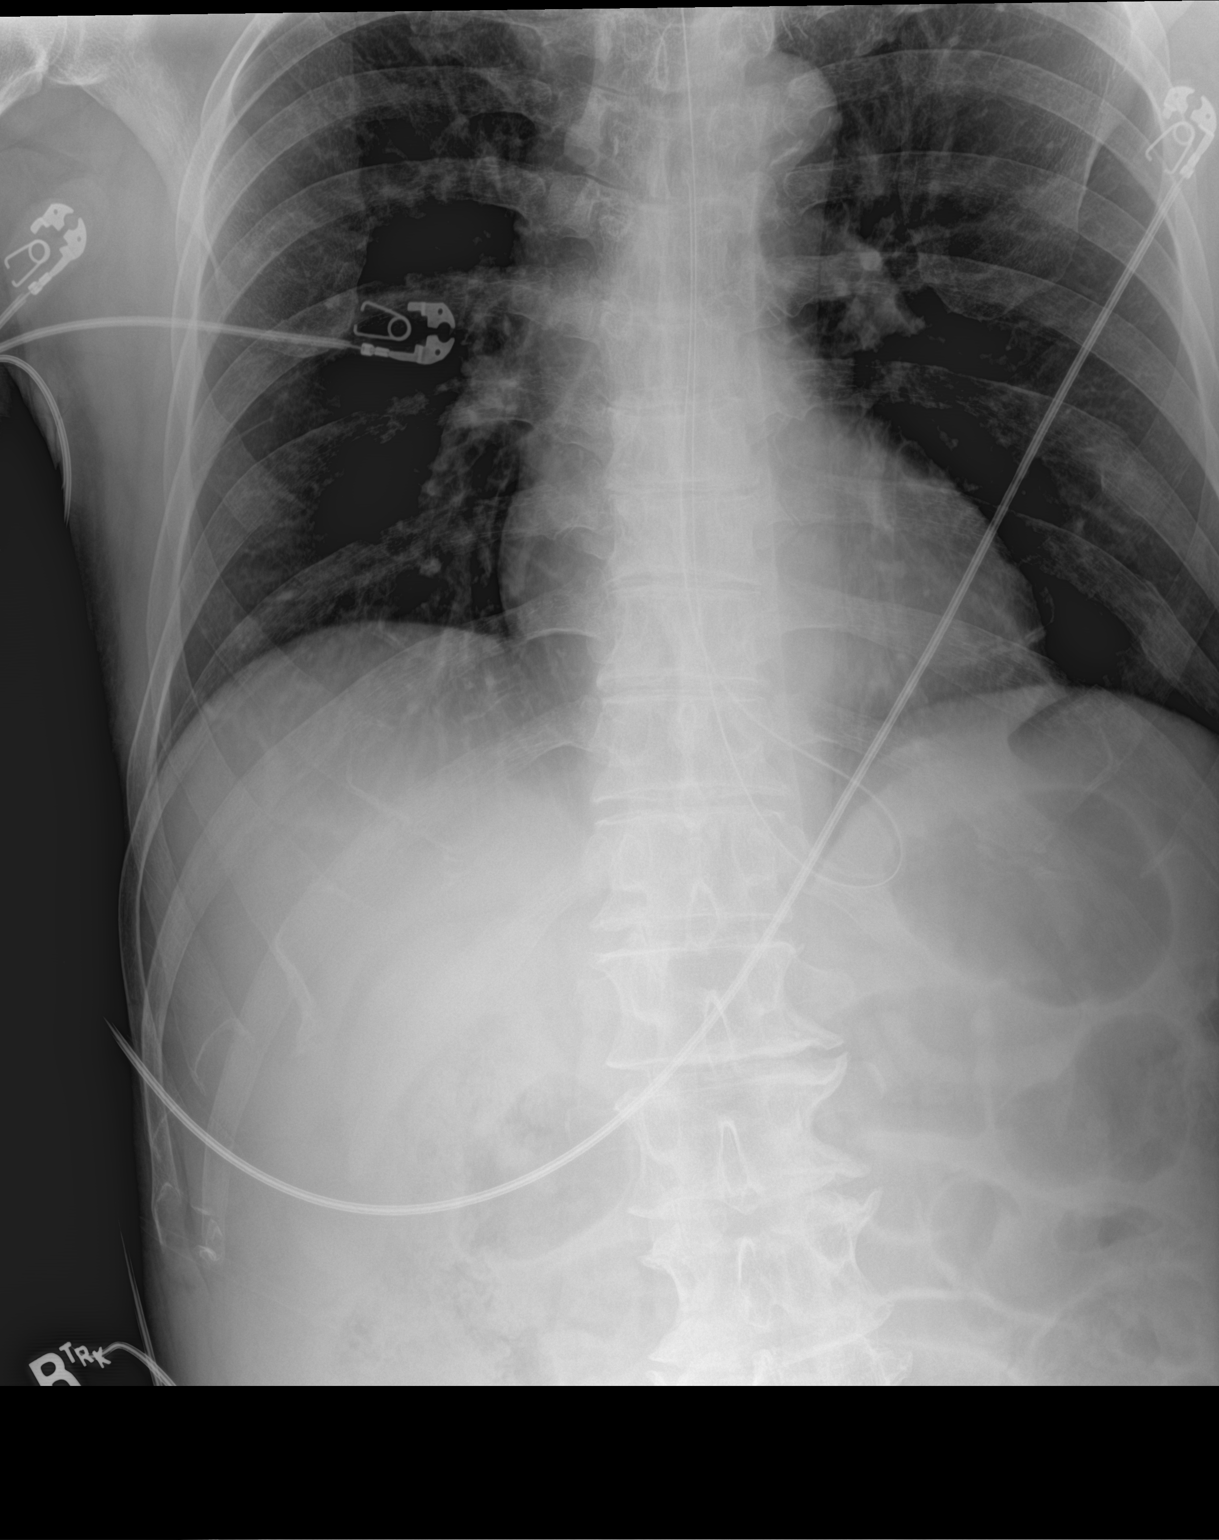
[im 2/2]
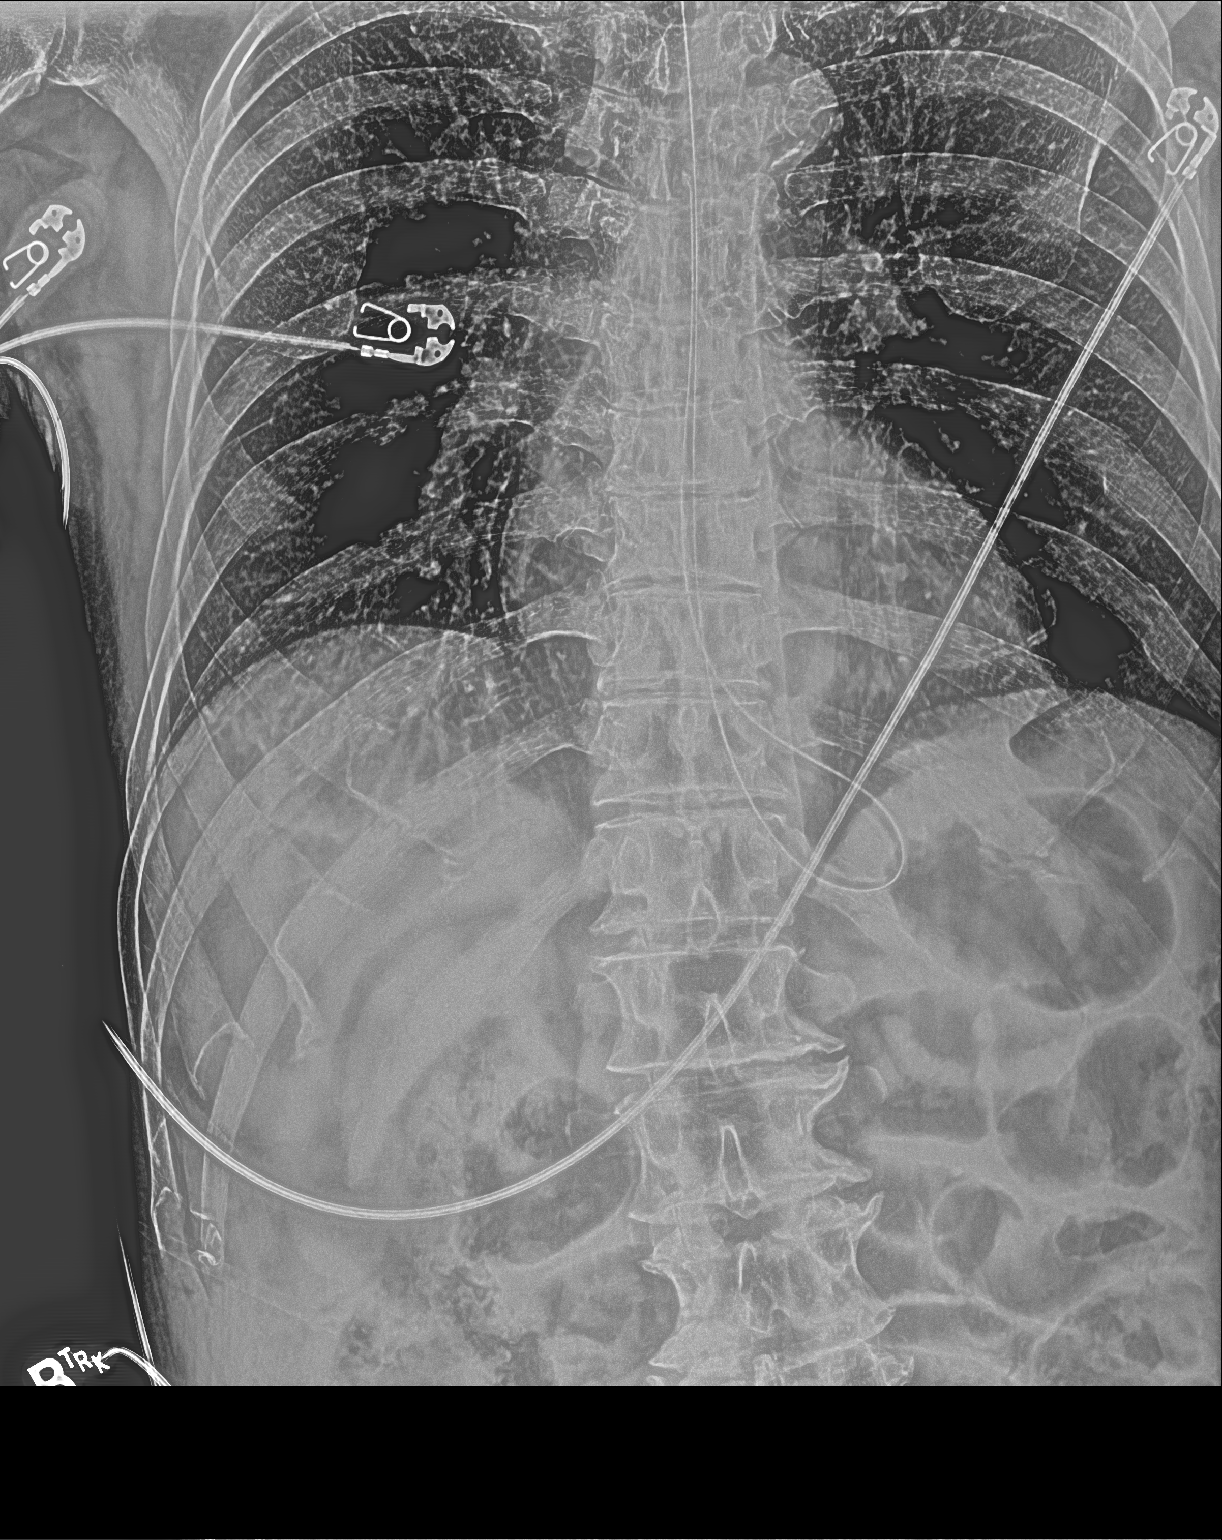

[2 of 2 positions shown; findings below may reference images not displayed]

FINDINGS: The nasogastric tube remains folded upon itself within the
esophagus. The tip overlies the upper esophagus. The lungs remain
clear. Gaseous distension of multiple loops of bowel noted in the
abdomen.
IMPRESSION: The nasogastric tube remains folded upon itself with the tip in the
upper esophagus.
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
## 2020-01-09 IMAGING — DX PORTABLE ABDOMEN - 1 VIEW
1 series · 1 of 1 positions shown · non-contrast
Comparison: CT of the abdomen 01/23/2019

CLINICAL DATA: Encounter for nasogastric tube placement.

EXAM:
PORTABLE ABDOMEN - 1 VIEW

[abdomen kub]
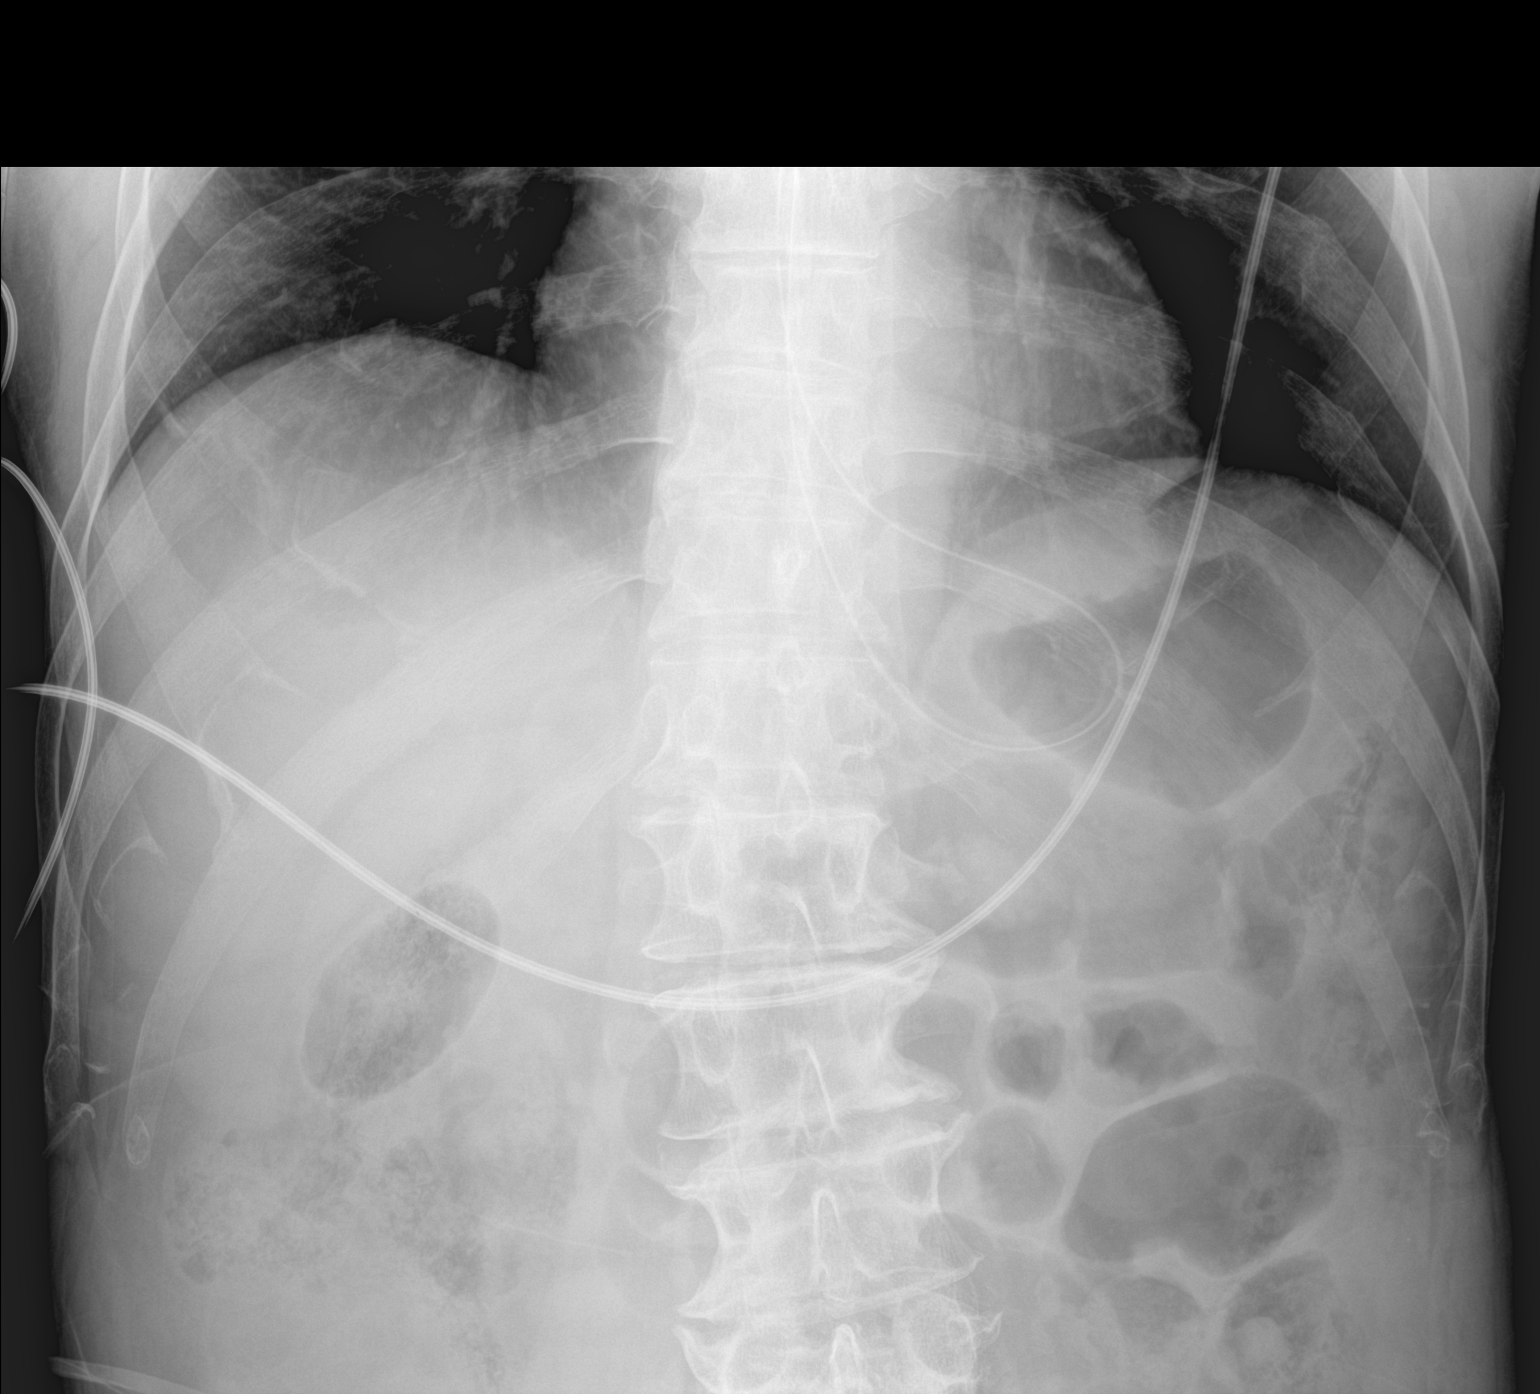

[1 of 1 positions shown; findings below may reference images not displayed]

FINDINGS: Nasogastric tube extends into the abdomen but then coils in the
proximal stomach and goes back up into the esophagus. Tip of the
nasogastric tube is probably in the mid esophagus region. Bowel gas
and stool in the upper abdomen. Limited evaluation of the lung
bases.
IMPRESSION: Nasogastric tube is coiled in the stomach and then goes back up into
the esophagus. The tip of the nasogastric tube is in the esophagus.
Recommend repositioning of the nasogastric tube.

## 2020-01-09 IMAGING — DX PORTABLE ABDOMEN - 1 VIEW
1 series · 1 of 1 positions shown · non-contrast
Comparison: 01/23/2019

CLINICAL DATA: NG tube repositioning

EXAM:
PORTABLE ABDOMEN - 1 VIEW

[abdomen kub]
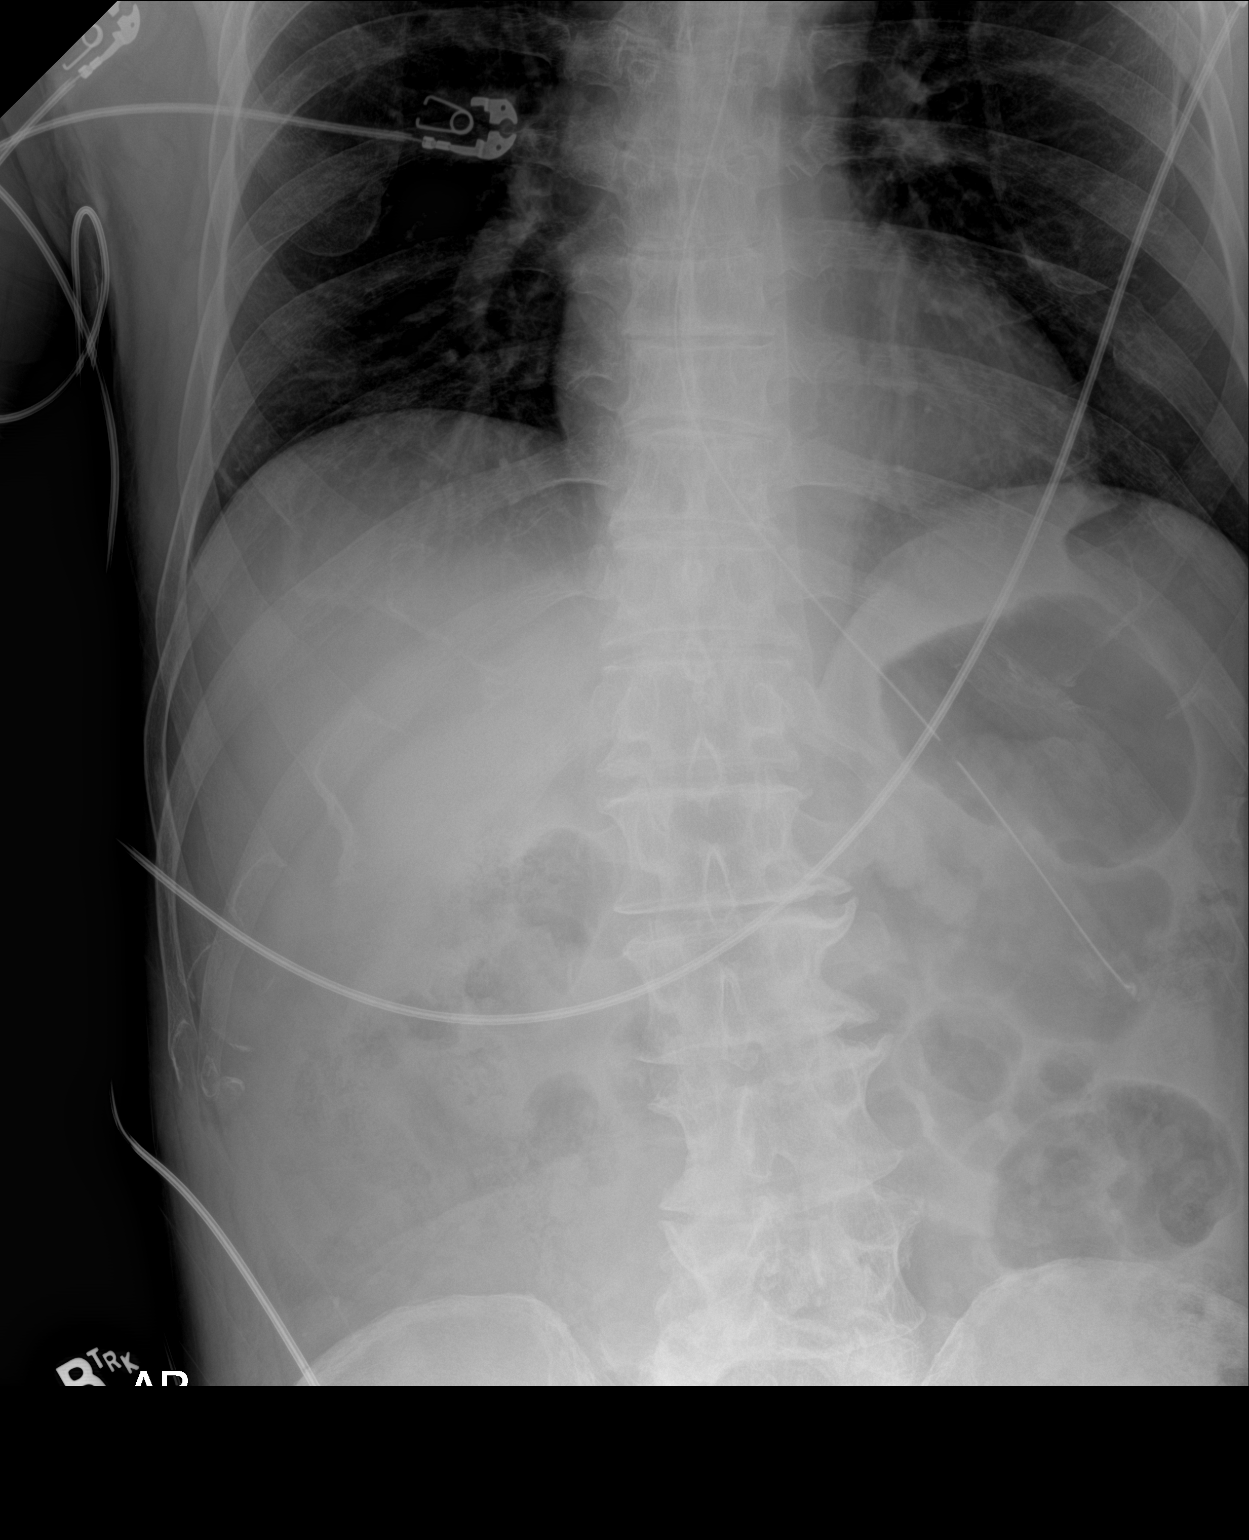

[1 of 1 positions shown; findings below may reference images not displayed]

FINDINGS: Interval repositioning of an esophagogastric tube, now with tip and
side port below the diaphragm.
IMPRESSION: Interval repositioning of an esophagogastric tube, now with tip and
side port below the diaphragm.
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
## 2020-01-10 IMAGING — CT CT HEAD WITHOUT CONTRAST
4 series · 16 of 47 positions shown, 18 images · non-contrast
Comparison: Yesterday

CLINICAL DATA: Subdural hemorrhage follow-up

EXAM:
CT HEAD WITHOUT CONTRAST
TECHNIQUE: Contiguous axial images were obtained from the base of the skull
through the vertex without intravenous contrast.

[Series 3: head wo · axial · 0.44mm/px · z∈[-104,+22]mm · 7 of 35 slices shown, 9 images]
[im 5/35  brain]
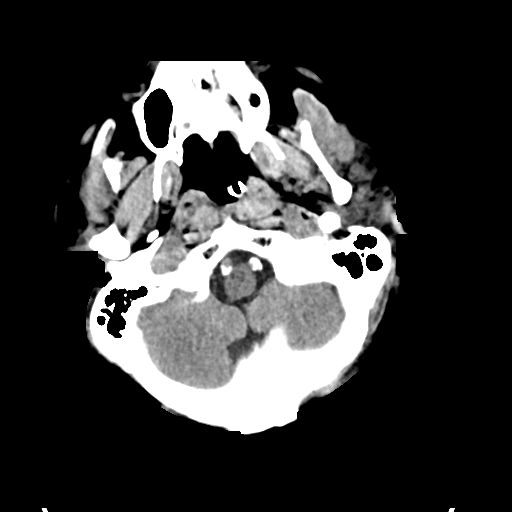
[im 5/35  bone]
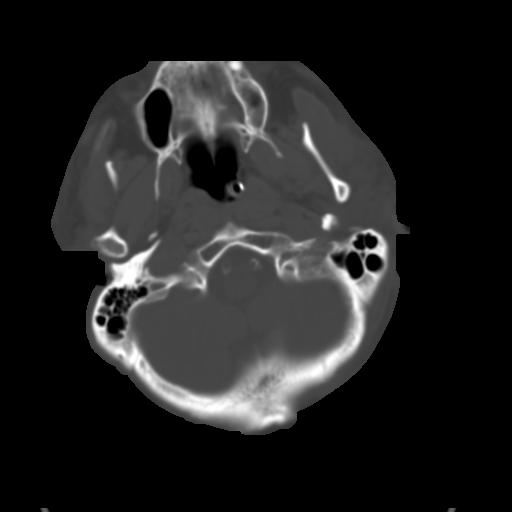
[im 9/35  brain]
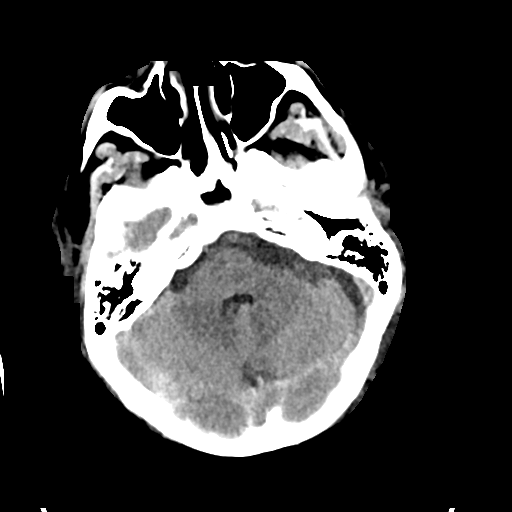
[im 13/35  brain]
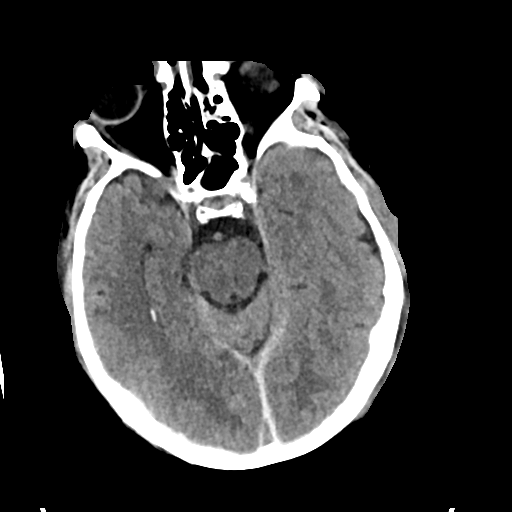
[im 18/35  brain]
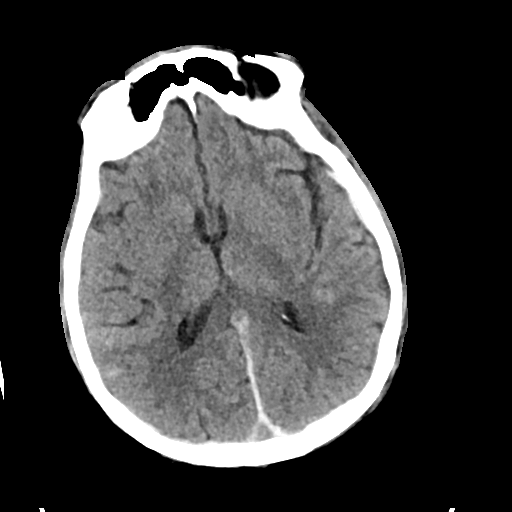
[im 22/35  brain]
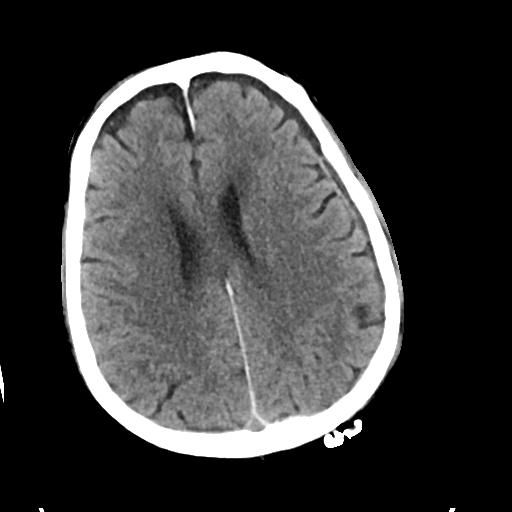
[im 22/35  bone]
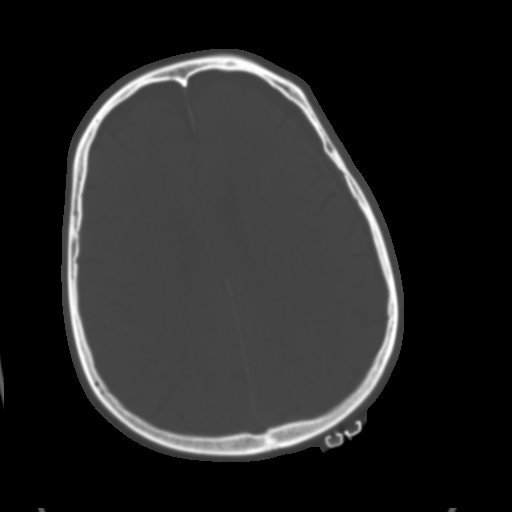
[im 26/35  brain]
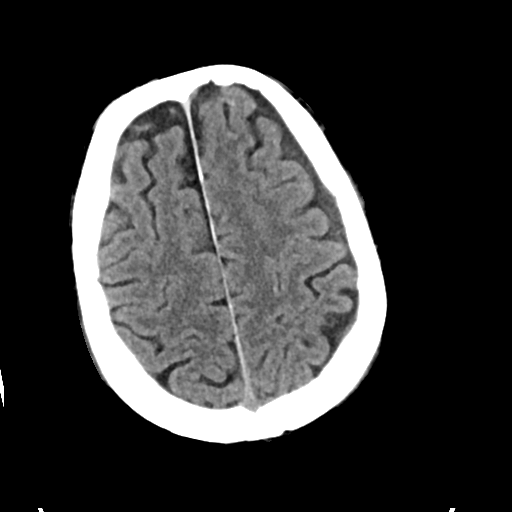
[im 30/35  brain]
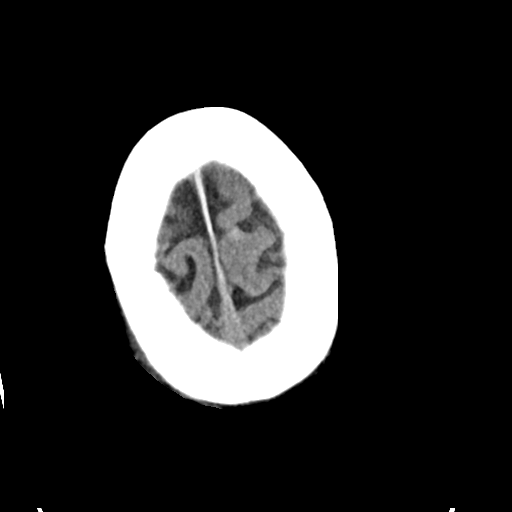

[Series 4: head bone · axial · 0.44mm/px · z∈[-108,-74]mm · 3 of 87 slices shown]
[im 9/87  bone]
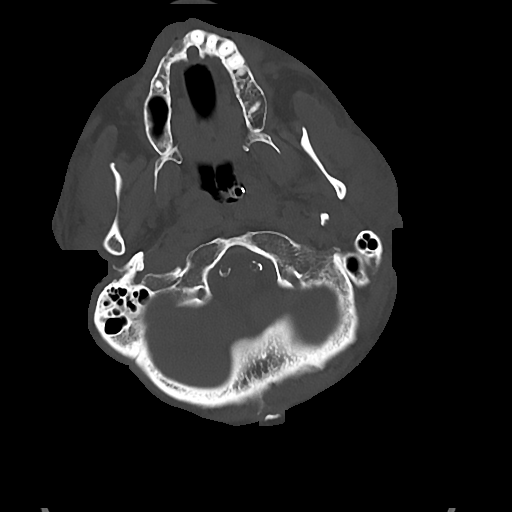
[im 18/87  bone]
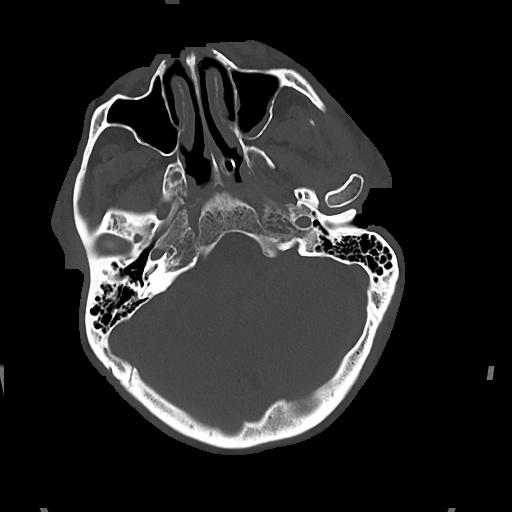
[im 26/87  bone]
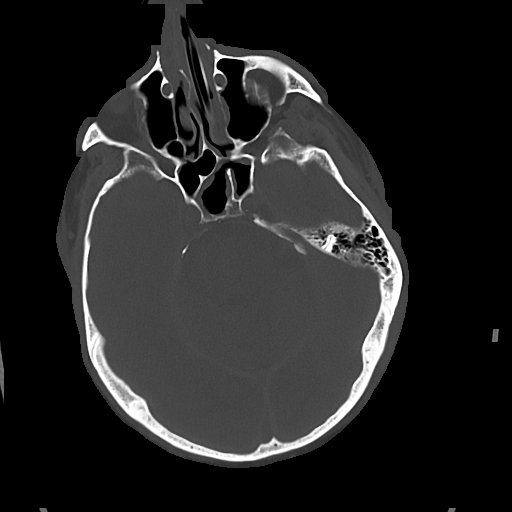

[Series 5: cor soft · coronal · 0.38mm/px · 3 of 69 slices shown]
[im 23/69  brain]
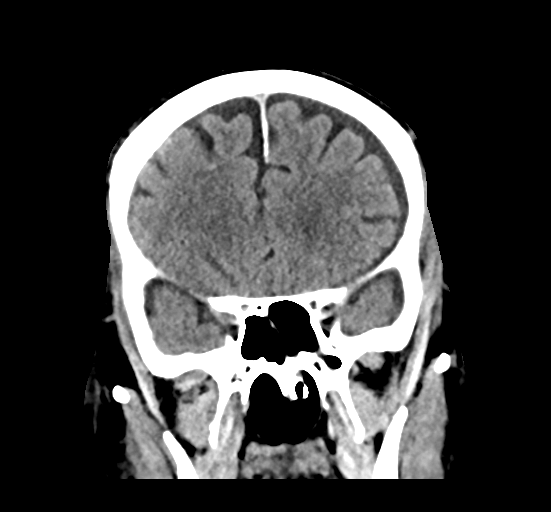
[im 31/69  brain]
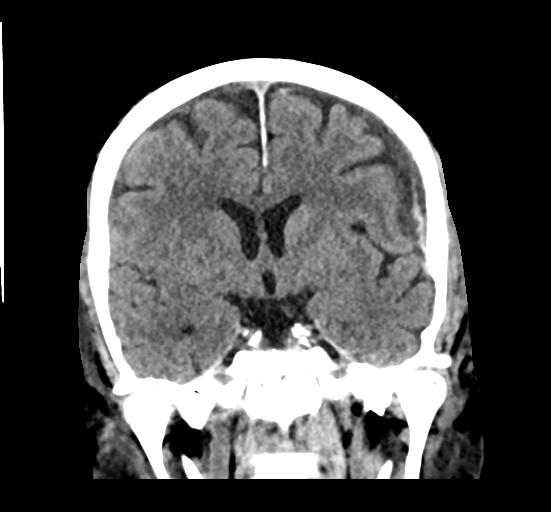
[im 38/69  brain]
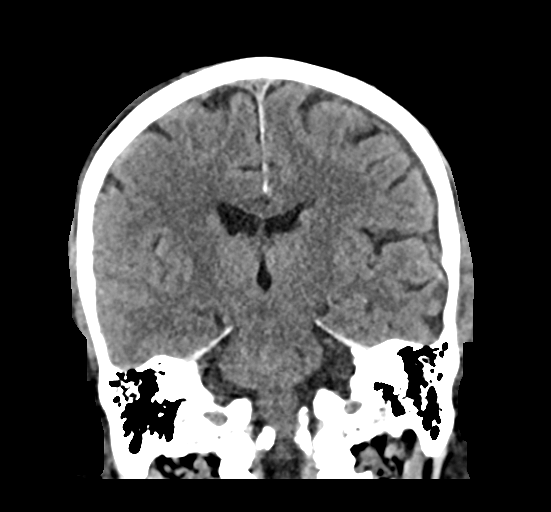

[Series 6: sag soft · sagittal · 0.36mm/px · 3 of 58 slices shown]
[im 20/58  brain]
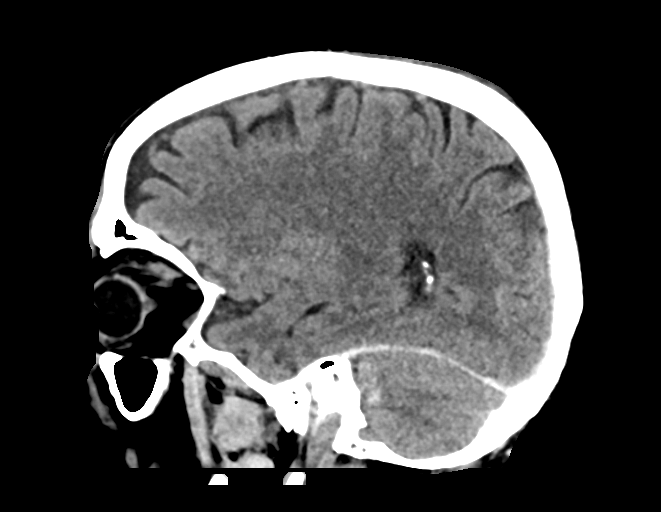
[im 29/58  brain]
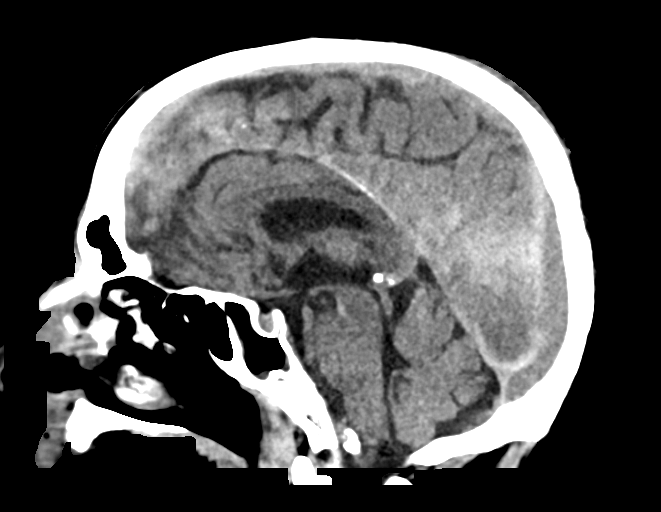
[im 39/58  brain]
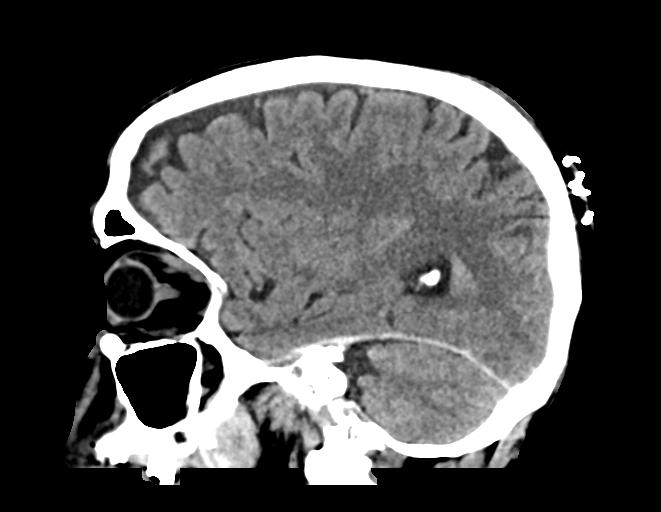

[16 of 47 positions shown; findings below may reference images not displayed]

FINDINGS: Brain: Thin subdural hematoma along the left cerebral convexity and
in the interhemispheric fissure which is diminished. High-density
clot along the convexity measures up to 3 mm in thickness today.
Patchy subarachnoid hemorrhage seen along the convexities, in the
interpeduncular fossa, and in the right CP angle. The extensive
subarachnoid blood is more prominent than before. Small volume
subdural hematoma along the right more than left tentorium. Midline
shift measures 3 mm. No hydrocephalus or infarct

Vascular: Atherosclerotic calcification

Skull: Scalp contusion with staples.  Comminuted nasal arch.

Sinuses/Orbits: Negative
IMPRESSION: 1. Decreasing subdural hematoma along the left cerebral convexity.
2. Patchy subarachnoid hemorrhage which has increased from
yesterday.
3. 3 mm of midline shift.

## 2020-01-11 IMAGING — CR PORTABLE ABDOMEN - 1 VIEW
1 series · 1 of 1 positions shown · non-contrast
Comparison: Prior radiograph from 01/24/2019

CLINICAL DATA: Initial evaluation for NG tube placement.

EXAM:
PORTABLE ABDOMEN - 1 VIEW

[AP]
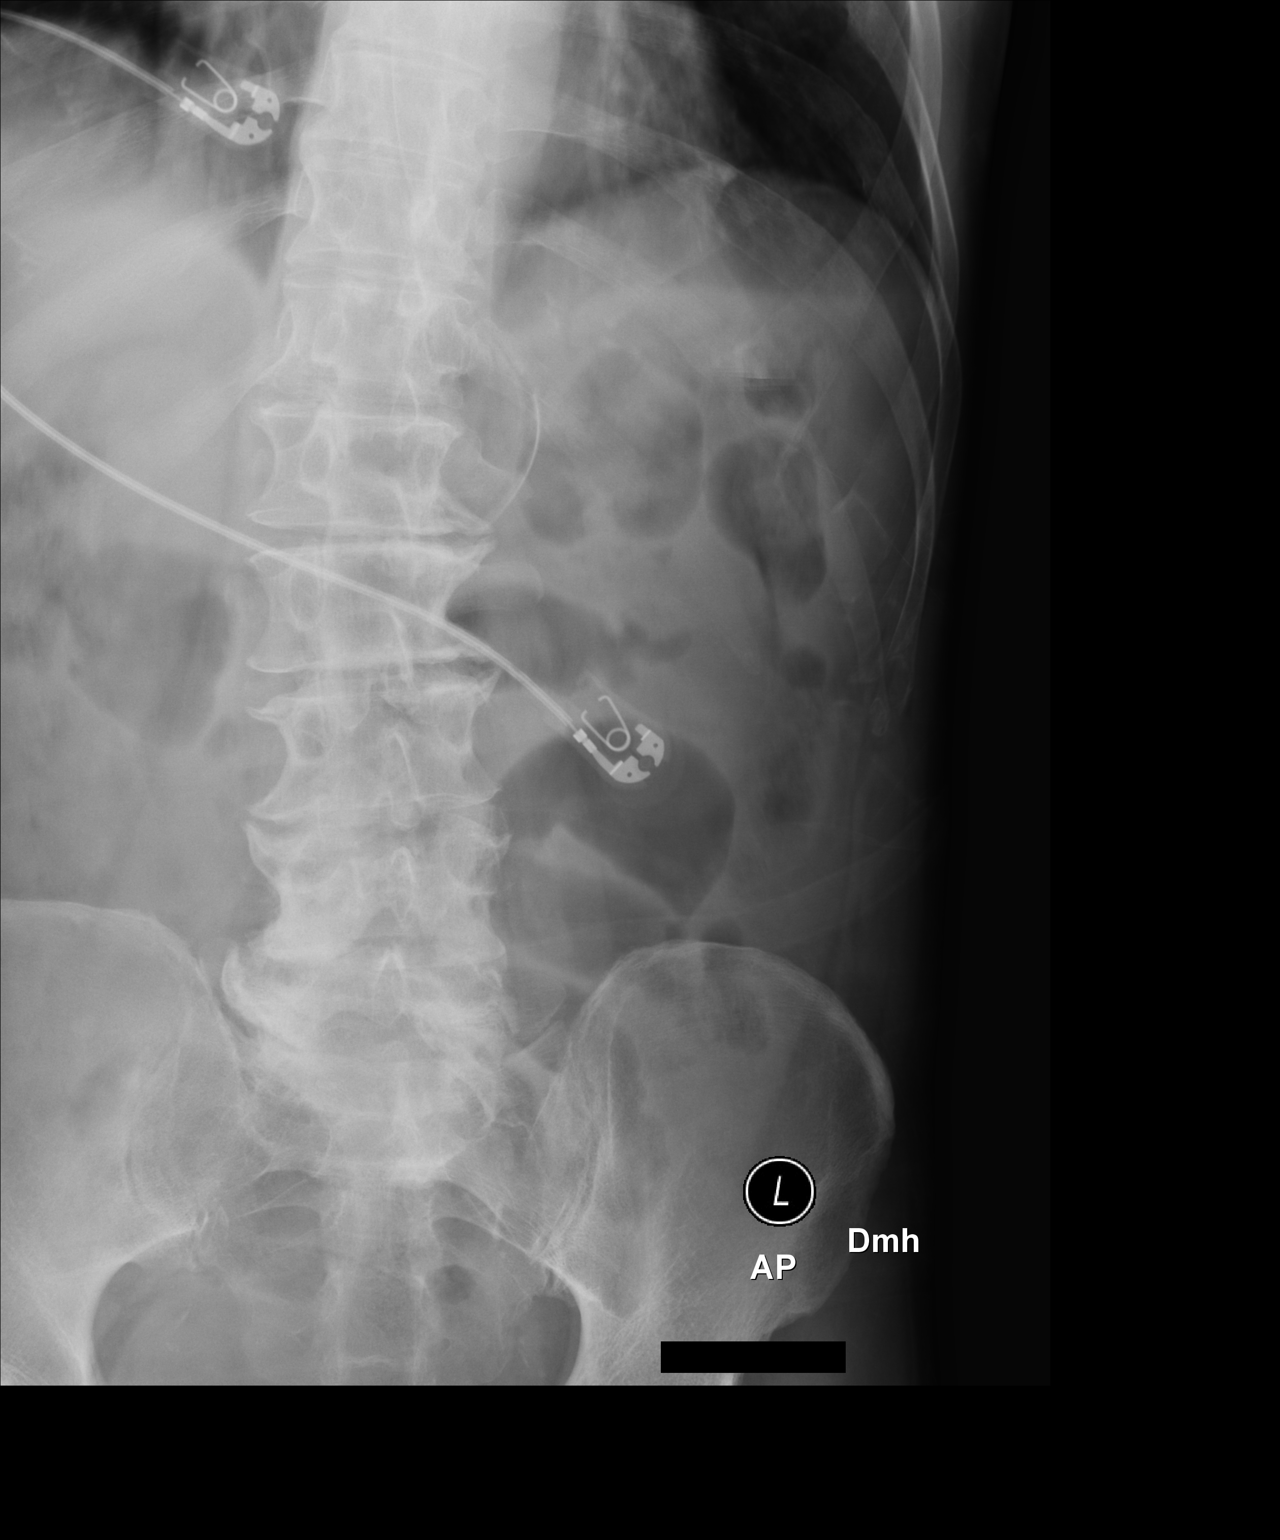

[1 of 1 positions shown; findings below may reference images not displayed]

FINDINGS: NG tube in place with tip overlying the gastric body, side hole just
beyond the GE junction.

Visualized bowel gas pattern is nonobstructive. No other acute
findings seen within the abdomen.

Partially visualized lung bases are grossly clear. Prominent
degenerative changes noted within the lower lumbar spine.
IMPRESSION: Tip of NG tube overlying the gastric body, side hole just beyond the
GE junction.

## 2020-01-12 IMAGING — CT CT HEAD WITHOUT CONTRAST
4 series · 15 of 47 positions shown, 17 images · non-contrast
Comparison: 01/24/2019, 01/23/2019, 09/19/2018

CLINICAL DATA: 57-year-old male with subdural hemorrhage/head
injury

EXAM:
CT HEAD WITHOUT CONTRAST
TECHNIQUE: Contiguous axial images were obtained from the base of the skull
through the vertex without intravenous contrast.

[Series 3: head wo · axial · 0.44mm/px · z∈[-242,-117]mm · 7 of 35 slices shown, 9 images]
[im 5/35  brain]
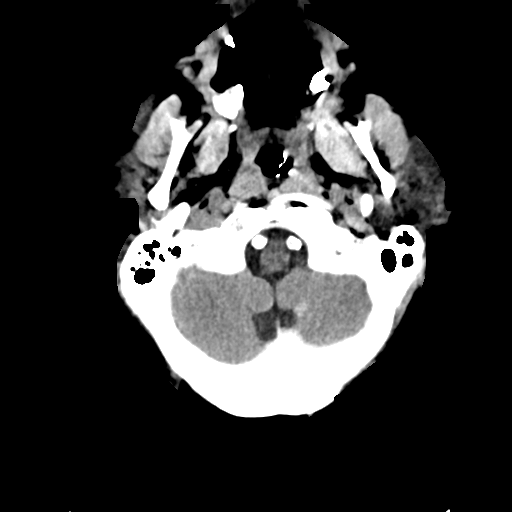
[im 5/35  bone]
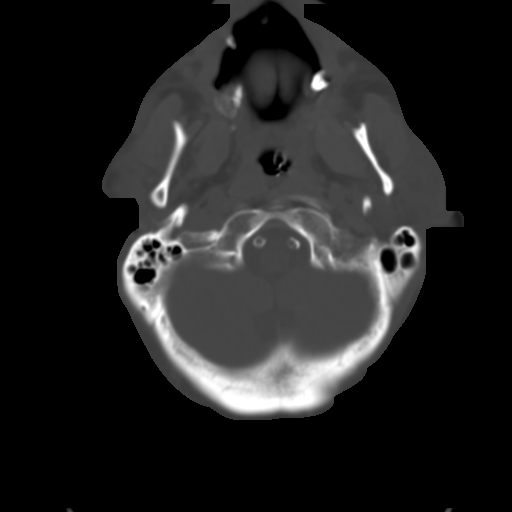
[im 9/35  brain]
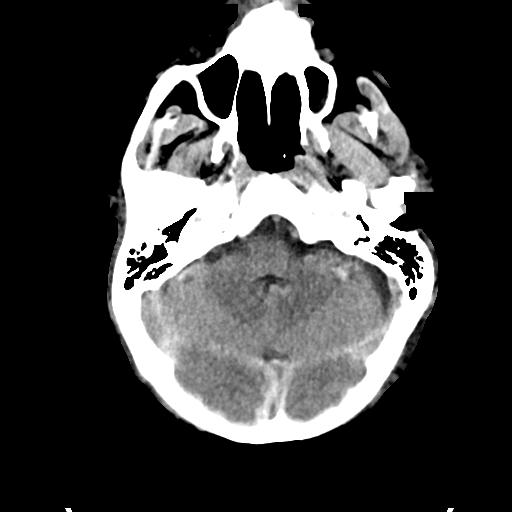
[im 13/35  brain]
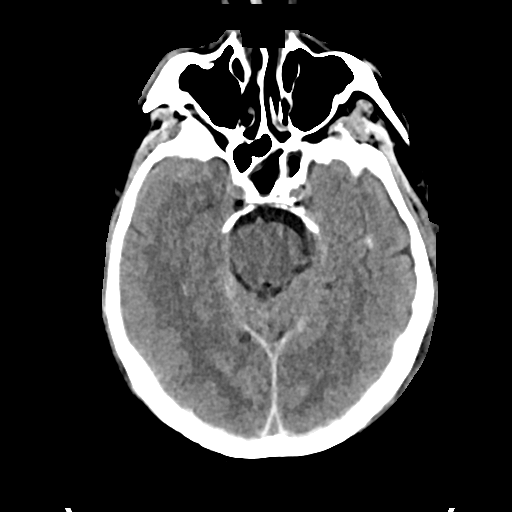
[im 18/35  brain]
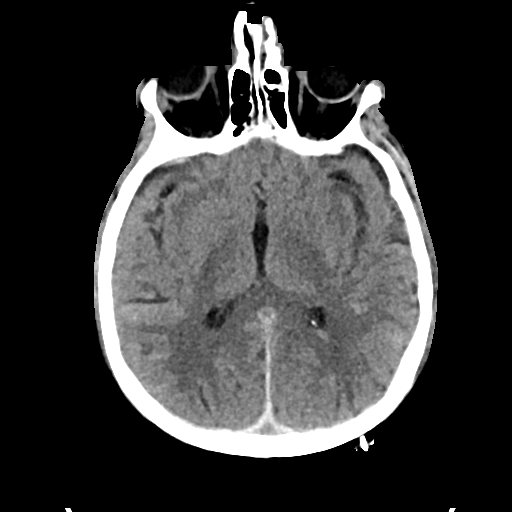
[im 22/35  brain]
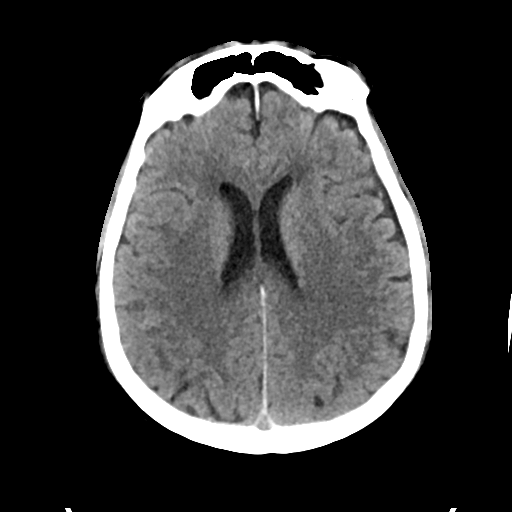
[im 22/35  bone]
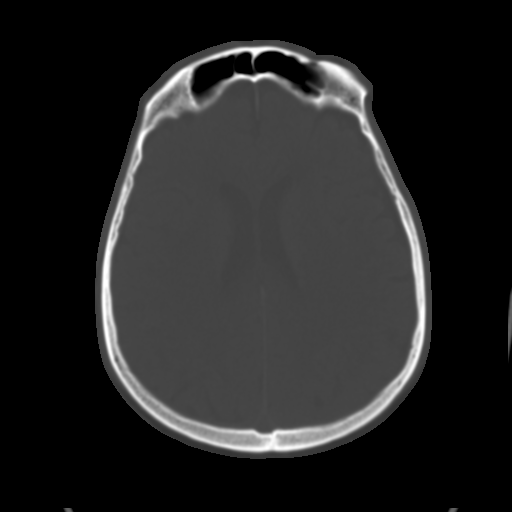
[im 26/35  brain]
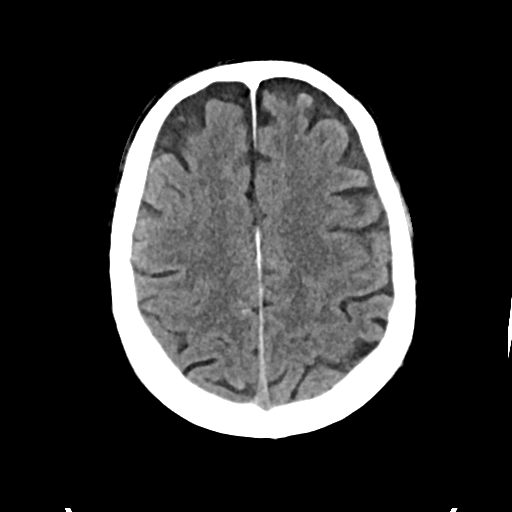
[im 30/35  brain]
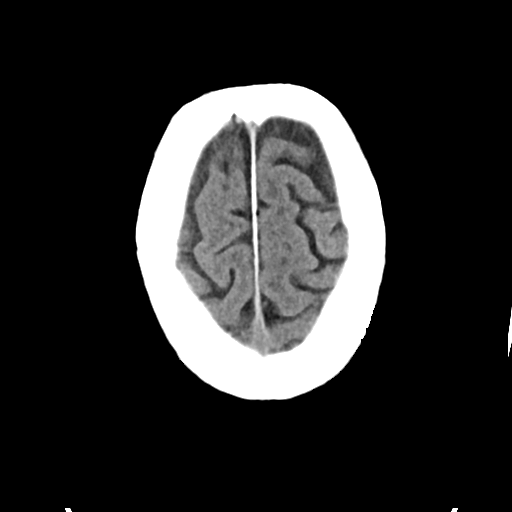

[Series 4: head bone · axial · 0.44mm/px · z∈[-246,-228]mm · 2 of 88 slices shown]
[im 9/88  bone]
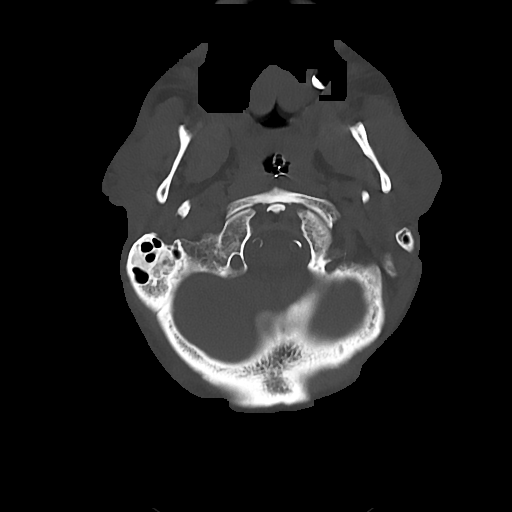
[im 18/88  bone]
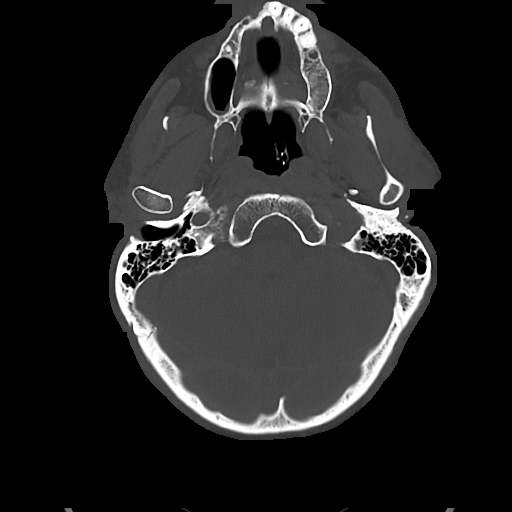

[Series 5: cor soft · coronal · 0.38mm/px · 3 of 68 slices shown]
[im 23/68  brain]
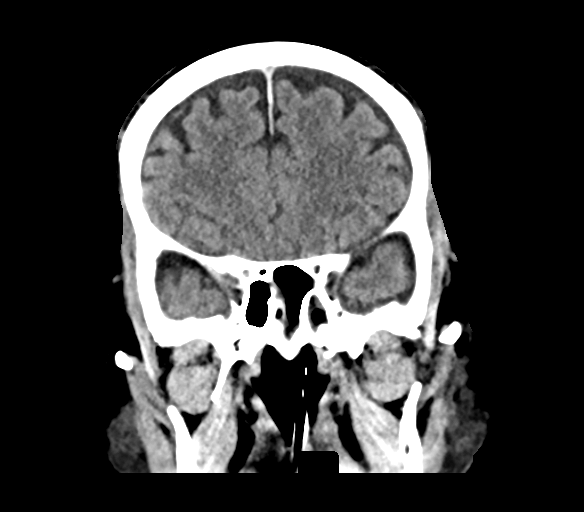
[im 30/68  brain]
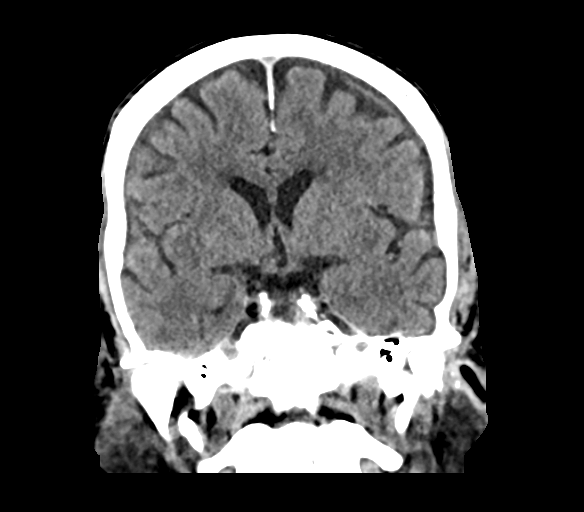
[im 38/68  brain]
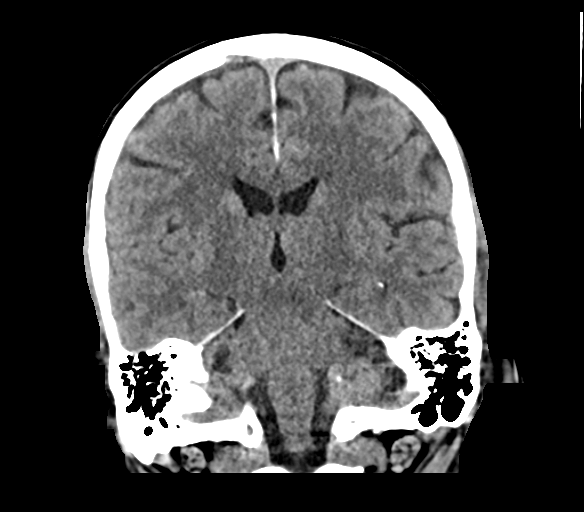

[Series 6: sag soft · sagittal · 0.37mm/px · 3 of 55 slices shown]
[im 19/55  brain]
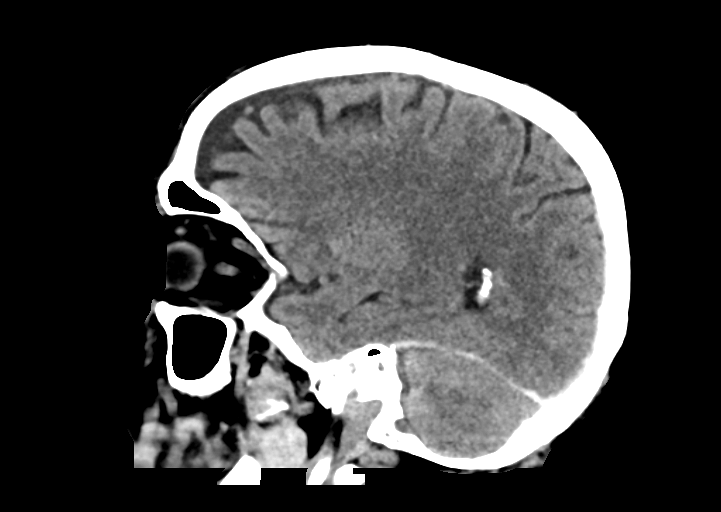
[im 28/55  brain]
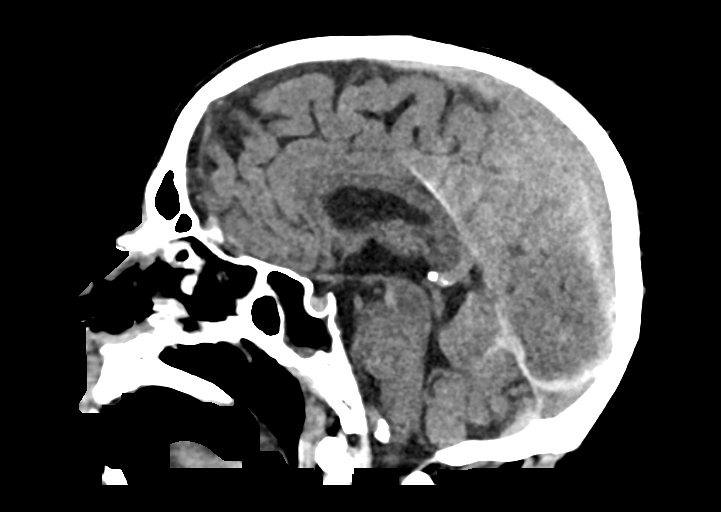
[im 37/55  brain]
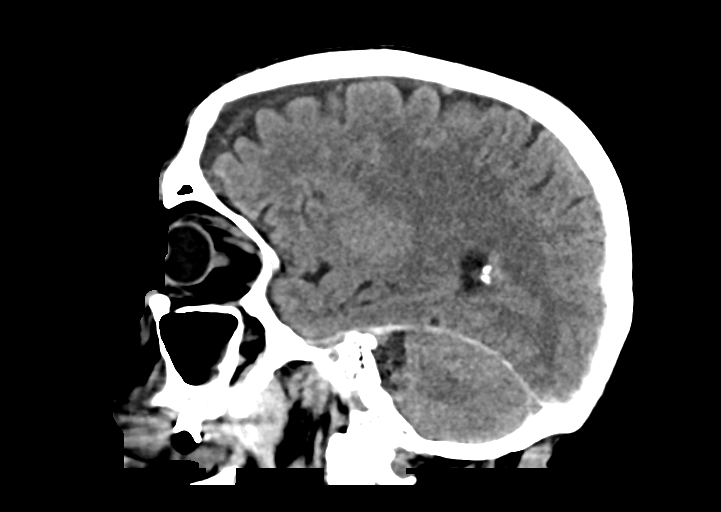

[15 of 47 positions shown; findings below may reference images not displayed]

FINDINGS: Brain: The left convexity subdural is mostly resolved, with only
small intermediate density component persisting without significant
local mass effect. There is near complete resolution of the
left-to-right midline shift.

Similar appearance of tentorial thickening/subdural hemorrhage.

Increasing conspicuity of intraventricular hemorrhage layered in the
posterior horns of the lateral ventricles, left greater than right,
as well as within the interpeduncular cistern.

Redemonstration multiple scattered foci of subarachnoid hemorrhage
in the right para median/perirolandic sulci, bilateral parietal
sulci. Redemonstration of subarachnoid/contusion of the left frontal
cortex and hemorrhage at the right CP angle.

No new midline shift or mass effect.  No new focus of hemorrhage.

Vascular: Intracranial atherosclerosis

Skull: Redemonstration of nasal bone fractures and associated soft
tissue swelling.

Sinuses/Orbits: Mucosal disease of the ethmoid air cells, bilateral
maxillary sinuses, sphenoid sinuses. No middle ear or mastoid
effusion. No evidence of globe injury.

Other: None
IMPRESSION: Resolving left-sided subdural hemorrhage, with near complete
resolution of left-to-right midline shift and no new subdural
hemorrhage.

Redemonstration of multifocal subarachnoid hemorrhage,
intraventricular hemorrhage, left frontal PRZEMCIO/contusion, and
interpeduncular cistern/right CP angle blood products. No new or
enlarging focus identified.

Similar appearance of nasal bone fractures and paranasal sinus
disease

## 2020-01-12 IMAGING — DX PORTABLE CHEST - 1 VIEW
1 series · 1 of 1 positions shown · non-contrast
Comparison: 01/23/2019 CT chest

CLINICAL DATA: 57 y/o  M; aspiration. Low O2 sats.

EXAM:
PORTABLE CHEST 1 VIEW

[chest ap]
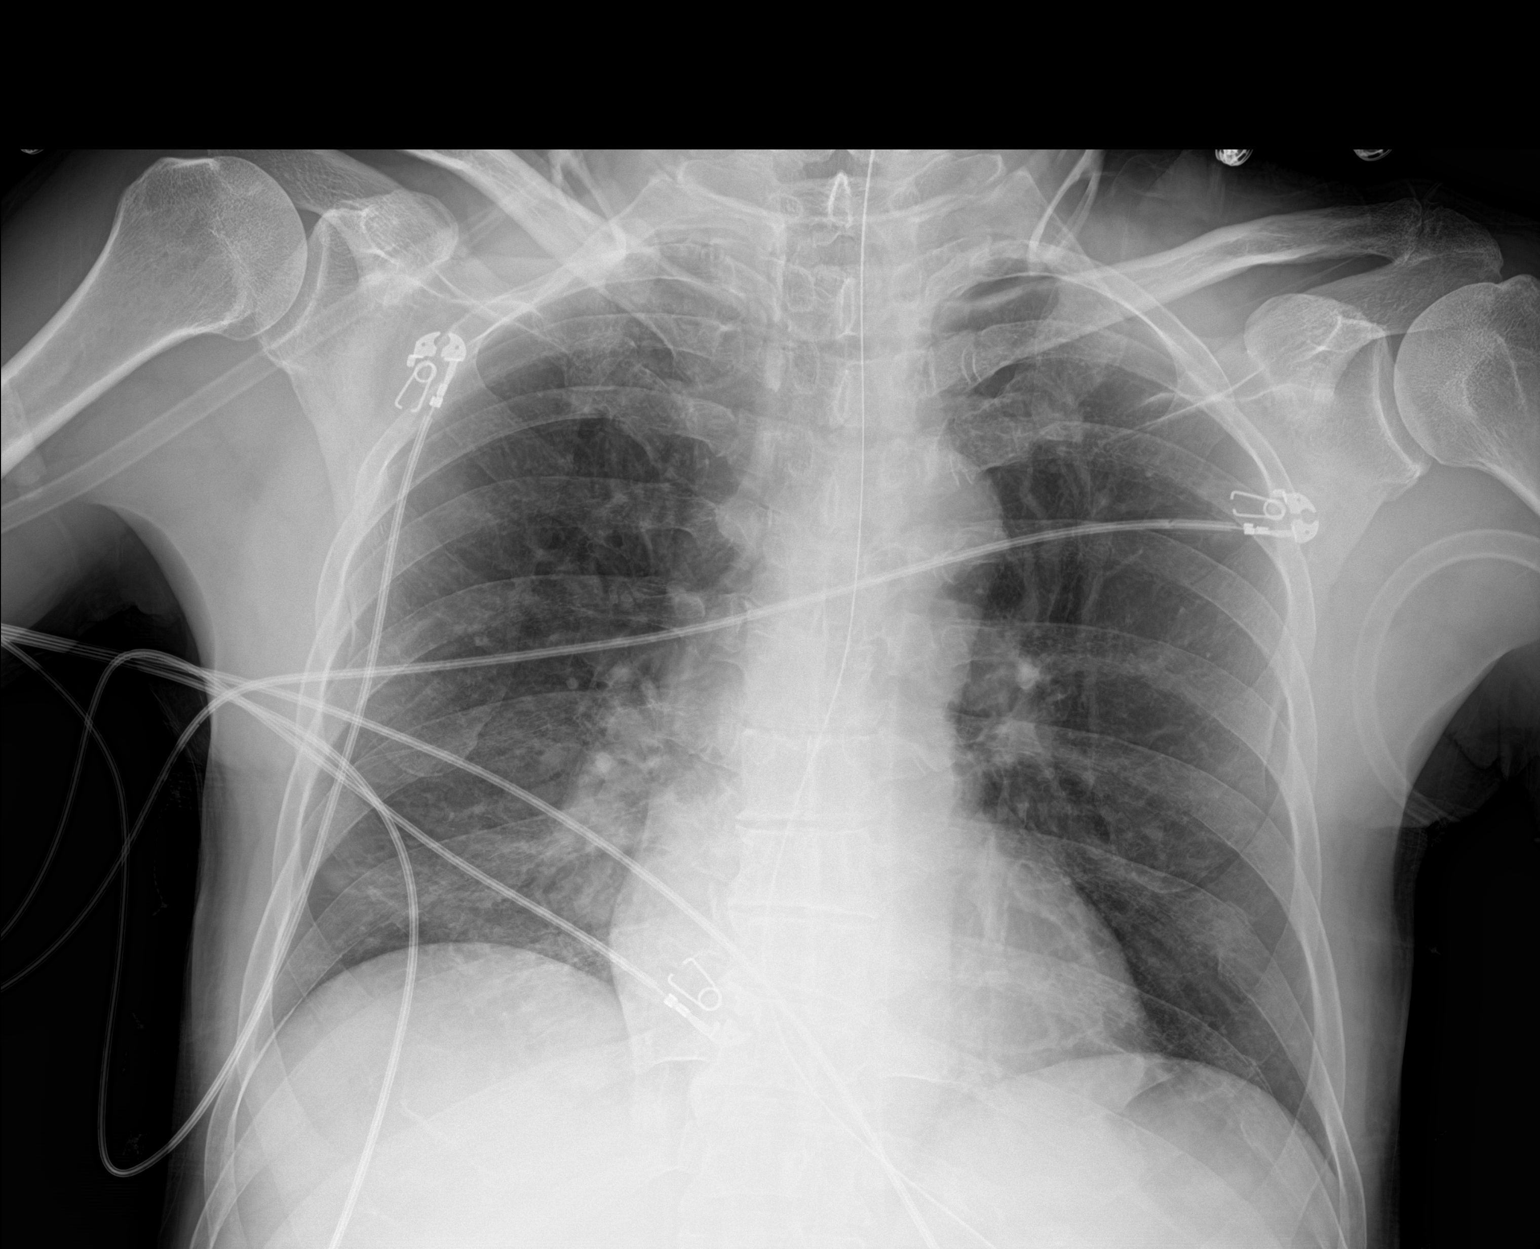

[1 of 1 positions shown; findings below may reference images not displayed]

FINDINGS: Stable cardiac silhouette given projection and technique. Hazy
opacity at the right lung base. No pleural effusion or pneumothorax.
Multiple chronic left-sided rib fractures. No acute osseous
abnormality identified. Enteric tube tip extends below the field of
view into the abdomen.
IMPRESSION: Hazy opacity at the right lung base may represent area of
aspiration.

## 2020-01-12 IMAGING — DX ABDOMEN - 1 VIEW
1 series · 1 of 1 positions shown · non-contrast
Comparison: 01/25/2019

CLINICAL DATA: Check gastric catheter placement

EXAM:
ABDOMEN - 1 VIEW

[abdomen kub]
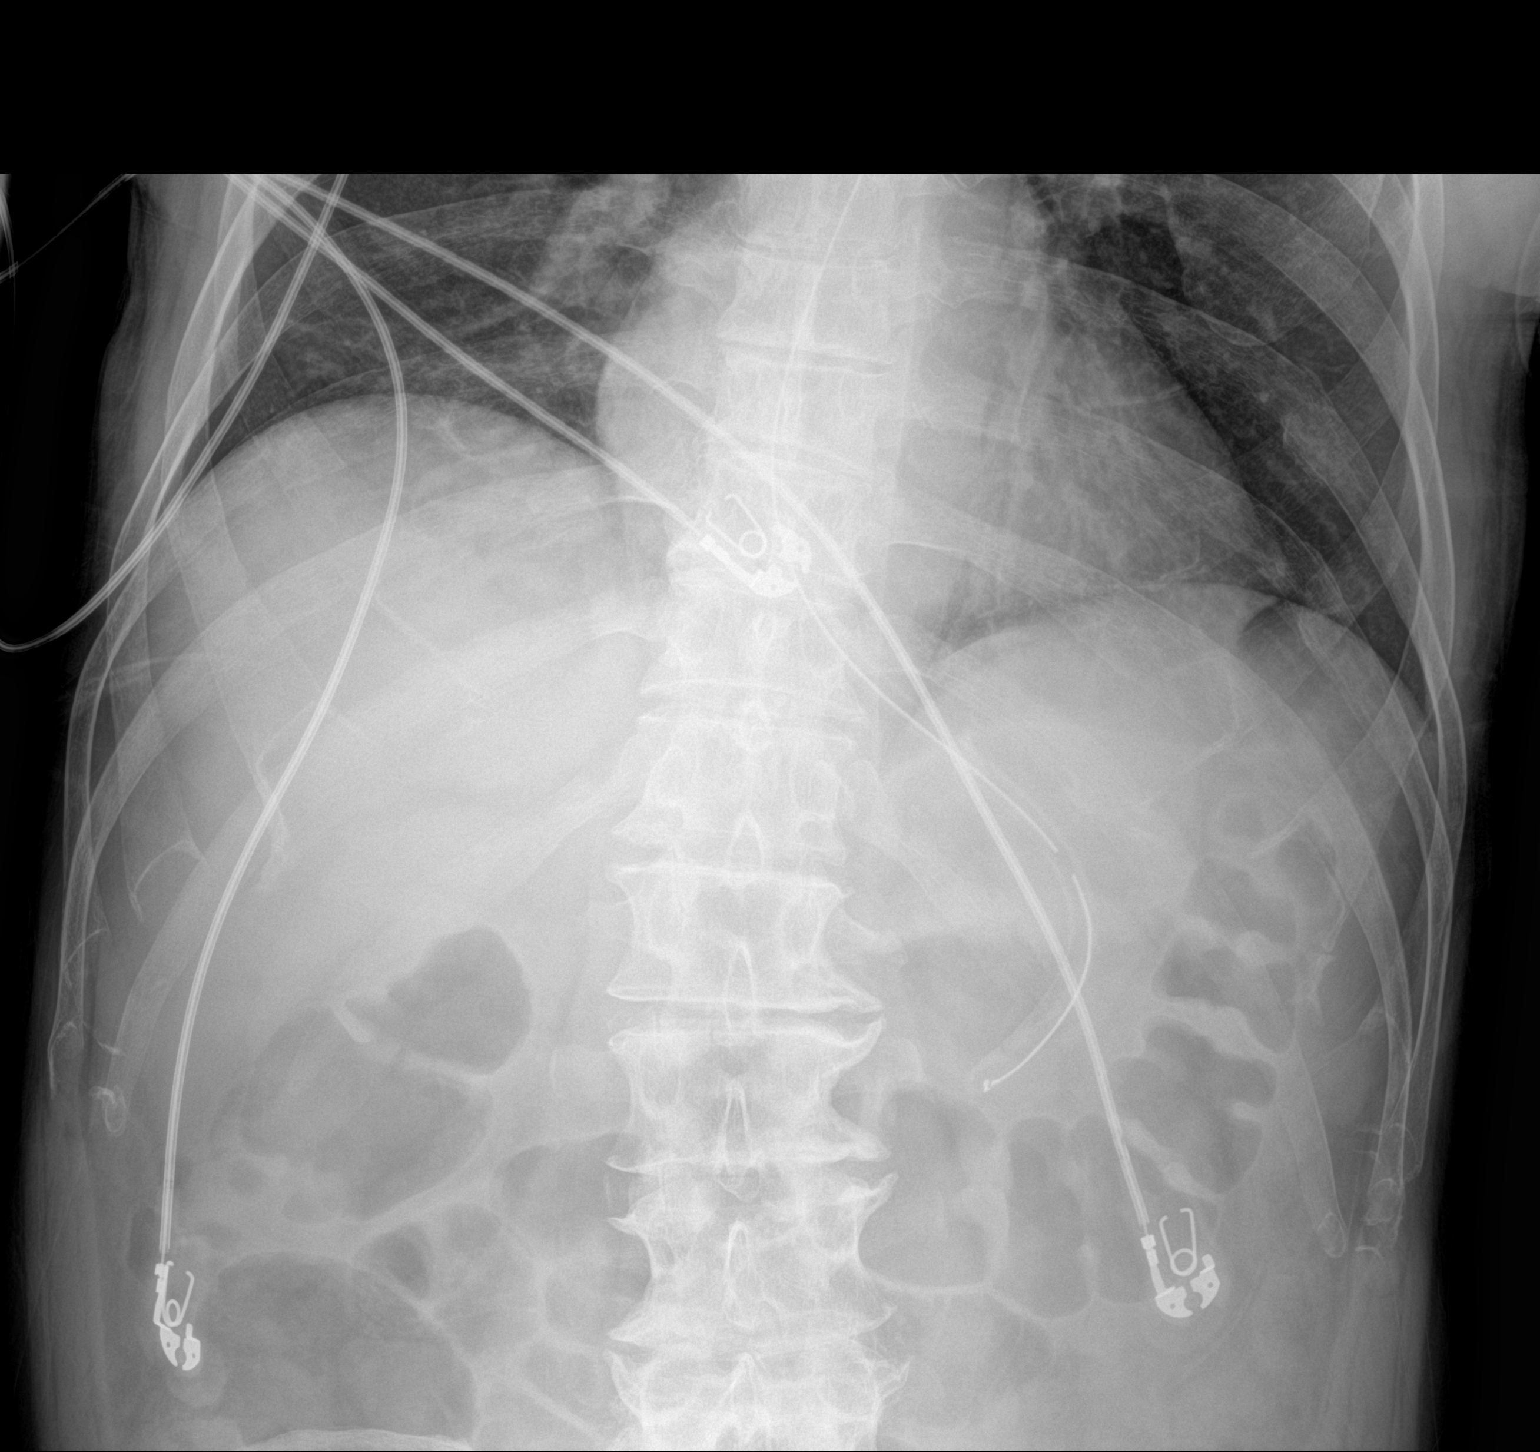

[1 of 1 positions shown; findings below may reference images not displayed]

FINDINGS: Scattered large and small bowel gas is noted. Gastric catheter is
noted within the midportion of the stomach.
IMPRESSION: Gastric catheter within the stomach.

## 2020-01-13 IMAGING — DX PORTABLE CHEST - 1 VIEW
1 series · 1 of 1 positions shown · non-contrast
Comparison: Radiograph January 26, 2019.

CLINICAL DATA: Endotracheal tube placement.

EXAM:
PORTABLE CHEST 1 VIEW

[chest]
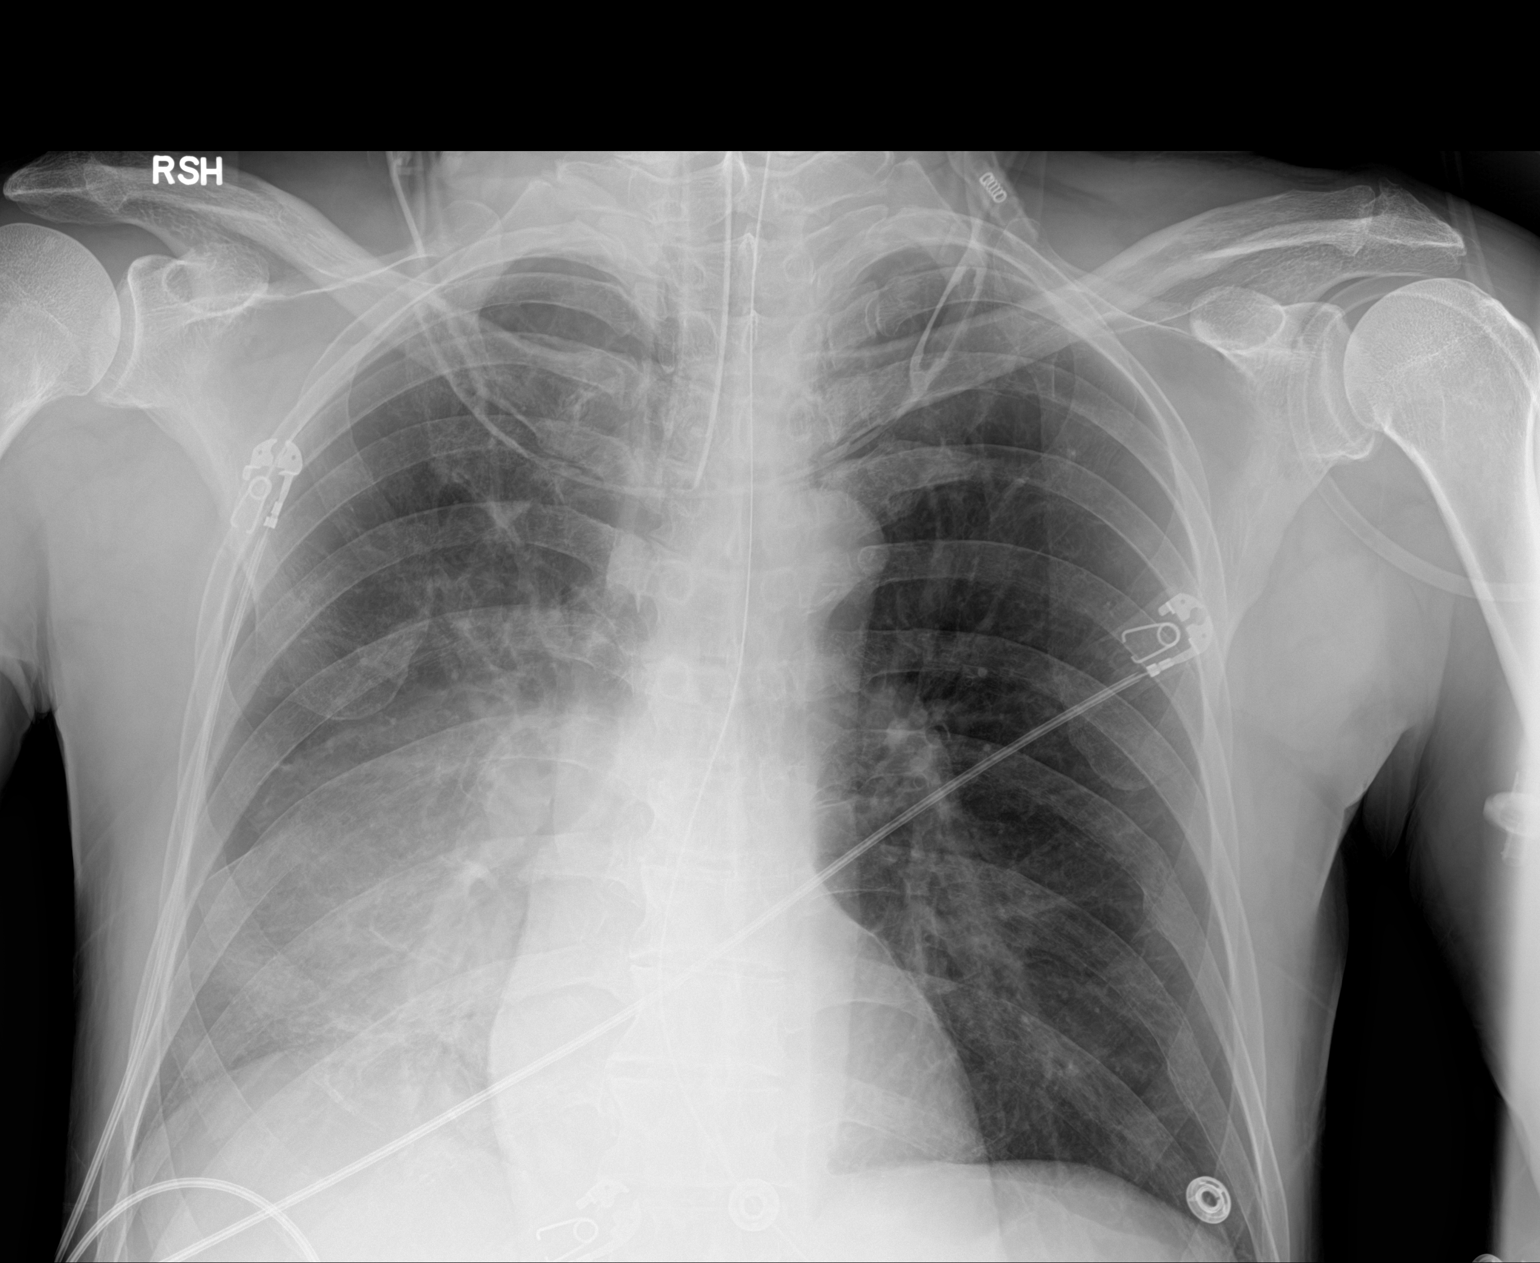

[1 of 1 positions shown; findings below may reference images not displayed]

FINDINGS: The heart size and mediastinal contours are within normal limits.
Endotracheal tube is in grossly good position. Nasogastric tube is
unchanged in position. No pneumothorax is noted. Left lung is clear.
New right basilar opacity is noted concerning for aspiration
pneumonia or atelectasis and associated pleural effusion. The
visualized skeletal structures are unremarkable.
IMPRESSION: Endotracheal and nasogastric tubes in grossly good position. New
right lower lobe opacity is noted concerning for aspiration
pneumonia or atelectasis and possible associated effusion.

## 2020-01-13 IMAGING — CT CT HEAD WITHOUT CONTRAST
4 series · 16 of 47 positions shown, 18 images · non-contrast
Comparison: Prior CT from 01/26/2019.

CLINICAL DATA: Follow-up examination for subarachnoid hemorrhage.
Change in pupils.

EXAM:
CT HEAD WITHOUT CONTRAST
TECHNIQUE: Contiguous axial images were obtained from the base of the skull
through the vertex without intravenous contrast.

[Series 3: head wo · axial · 0.43mm/px · z∈[-120,+10]mm · 7 of 36 slices shown, 9 images]
[im 5/36  brain]
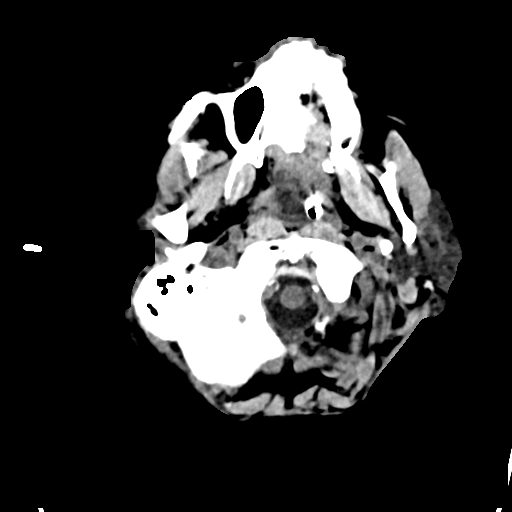
[im 5/36  bone]
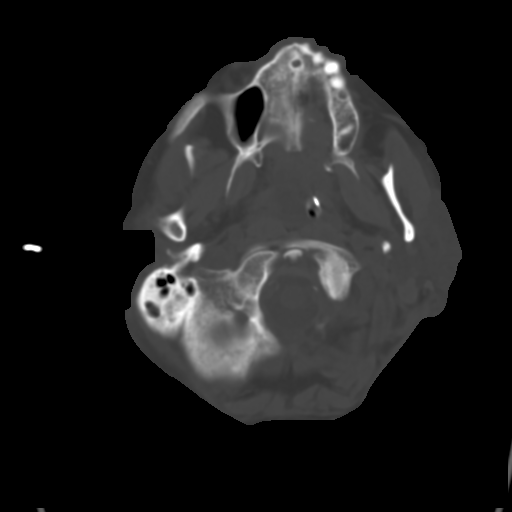
[im 9/36  brain]
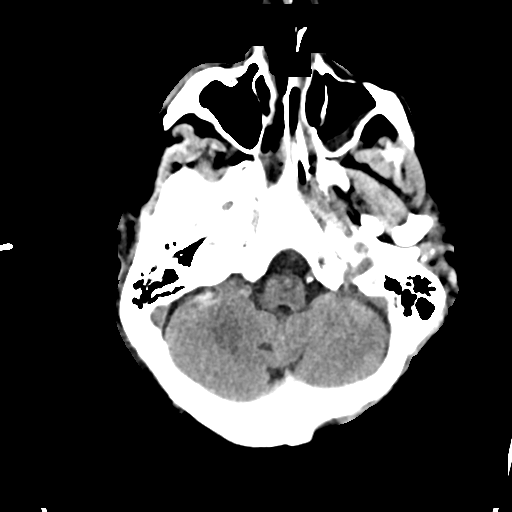
[im 14/36  brain]
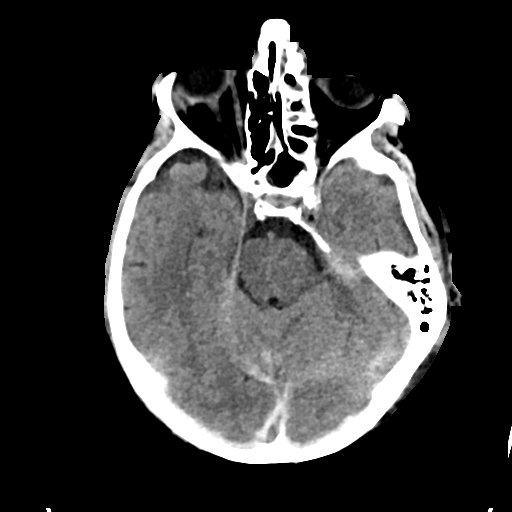
[im 18/36  brain]
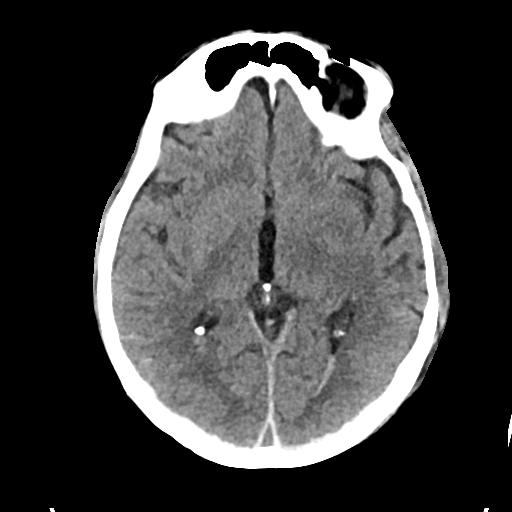
[im 22/36  brain]
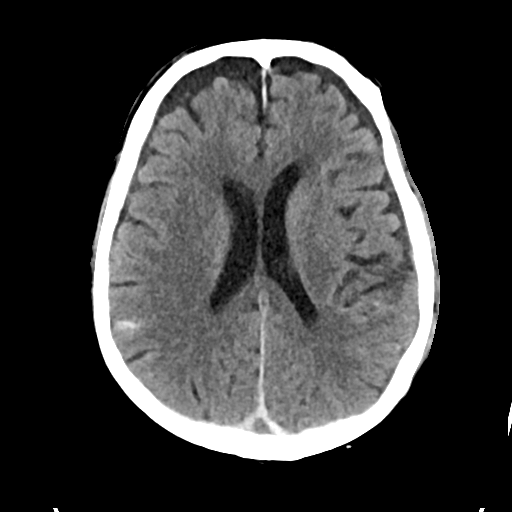
[im 22/36  bone]
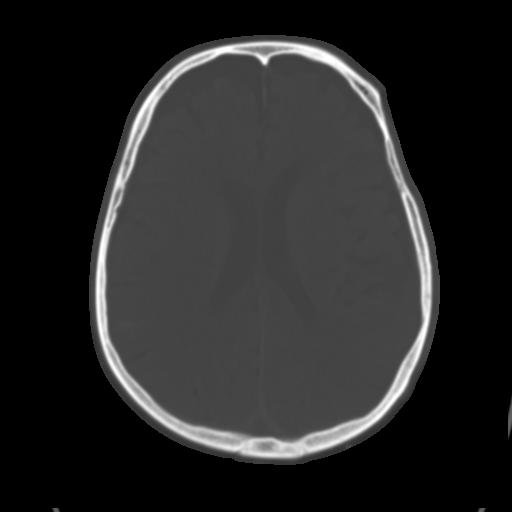
[im 27/36  brain]
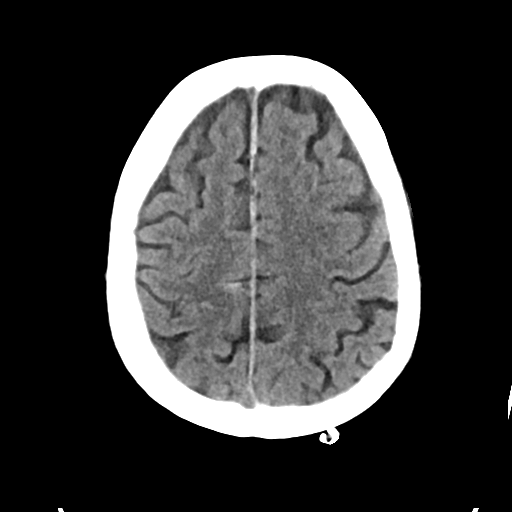
[im 31/36  brain]
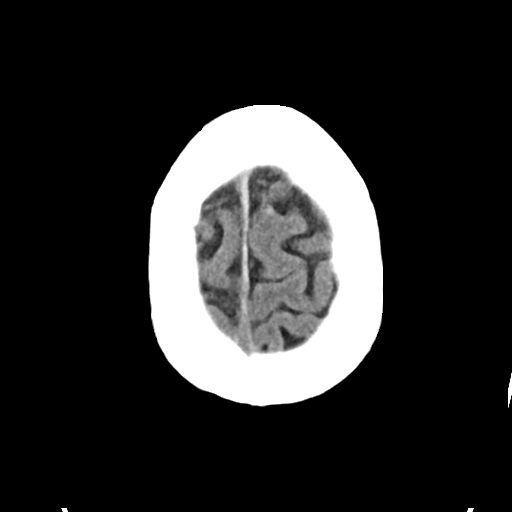

[Series 4: head bone · axial · 0.43mm/px · z∈[-124,-88]mm · 3 of 90 slices shown]
[im 9/90  bone]
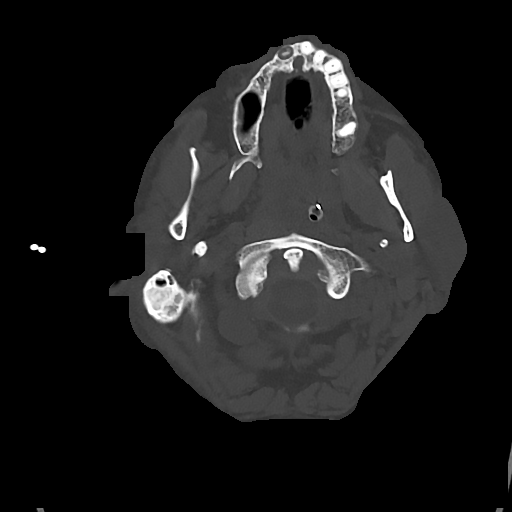
[im 18/90  bone]
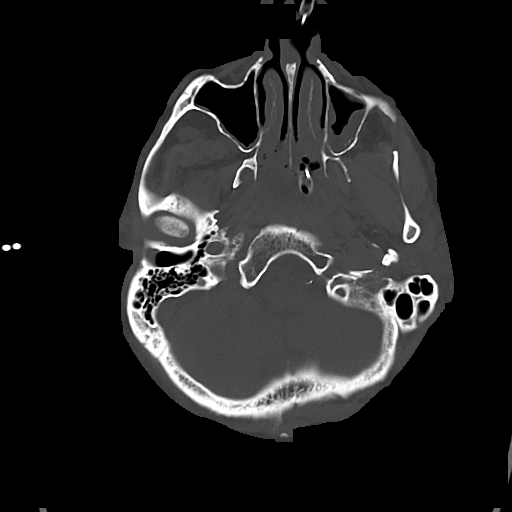
[im 27/90  bone]
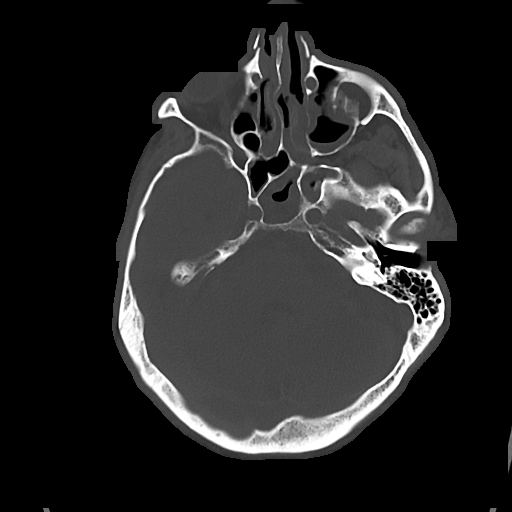

[Series 5: cor soft · coronal · 0.35mm/px · 3 of 71 slices shown]
[im 24/71  brain]
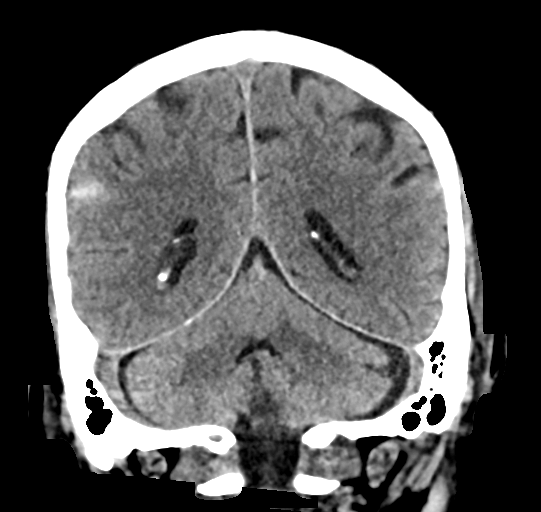
[im 32/71  brain]
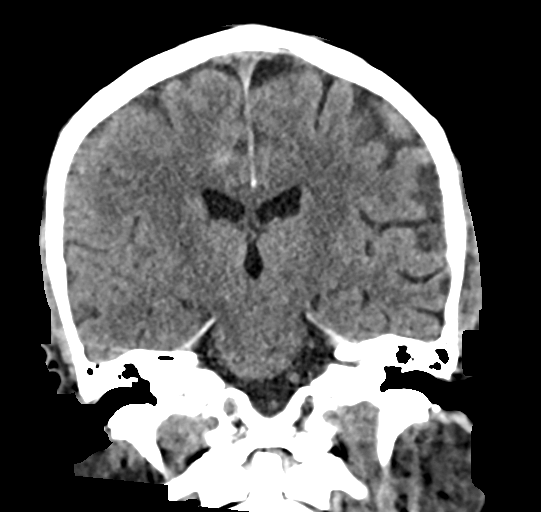
[im 39/71  brain]
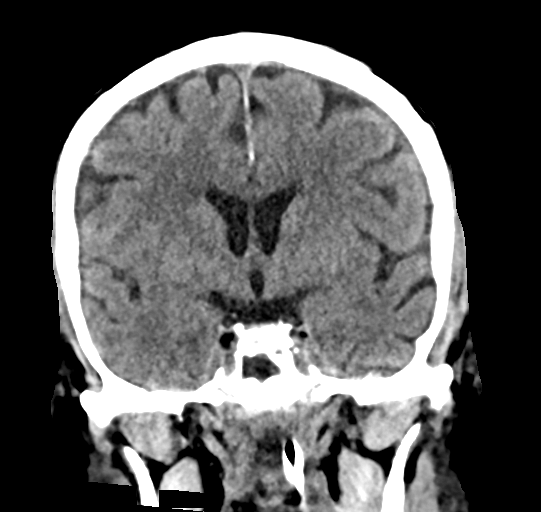

[Series 6: sag soft · sagittal · 0.37mm/px · 3 of 62 slices shown]
[im 23/62  brain]
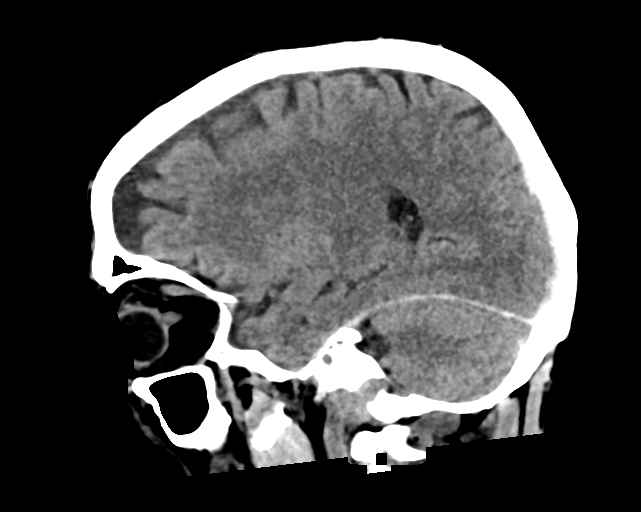
[im 31/62  brain]
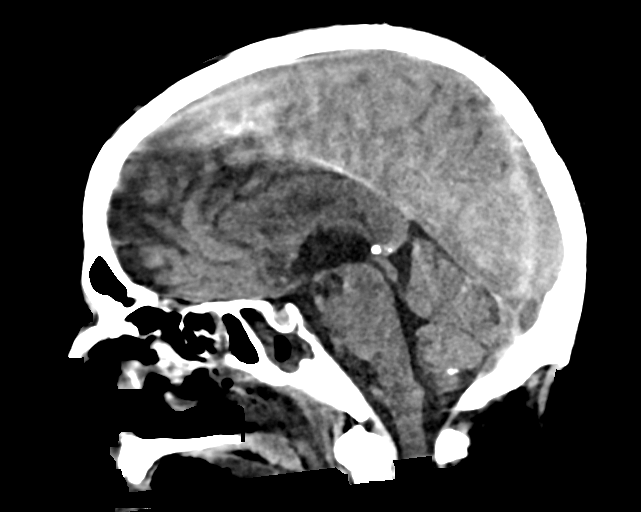
[im 39/62  brain]
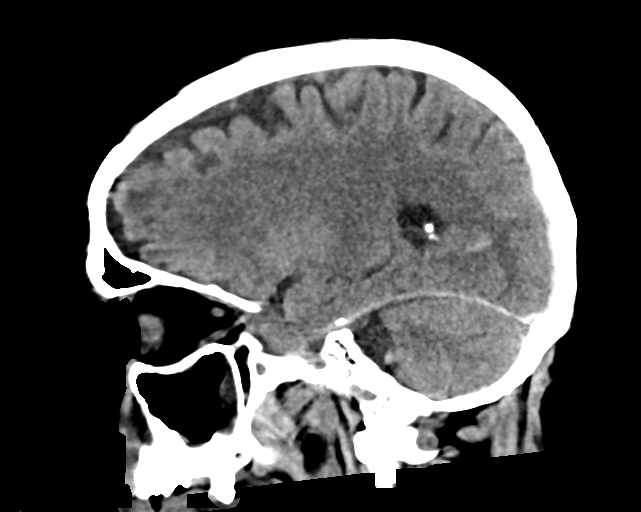

[16 of 47 positions shown; findings below may reference images not displayed]

FINDINGS: Brain: Persistent small volume subdural hemorrhage again seen
overlying the bilateral cerebral convexities, most notable
posteriorly. This measures up to 3 mm in maximal thickness.
Extension along the falx and tentorium, also unchanged. No
significant midline shift or mass effect.

Scattered small volume subarachnoid hemorrhage involving the
bilateral cerebral hemispheres again seen, little interval change
from previous. Trace hemorrhage noted within the interpeduncular
cistern. Small volume intraventricular hemorrhage seen layering
within the occipital horns of both lateral ventricles, also little
interval changed. No hydrocephalus or ventricular trapping. Basilar
cisterns remain patent. No uncal herniation.

No acute large vessel territory infarct.

Vascular: No unexpected hyperdense vessel. Calcified atherosclerosis
at the skull base.

Skull: Left parietal scalp laceration with skin staples in place.
Calvarium unchanged.

Sinuses/Orbits: Globes and orbital soft tissues within normal
limits. Moderate mucosal thickening throughout the ethmoidal air
cells, sphenoid sinuses, and maxillary sinuses. Endotracheal and
enteric tubes in place. Mastoid air cells remain clear.

Other: None.
IMPRESSION: 1. No significant interval change in small volume acute subdural and
subarachnoid hemorrhage as above. No significant mass effect or
midline shift.
2. Small volume intraventricular hemorrhage, also not significantly
changed. No hydrocephalus or ventricular trapping.
3. No other new acute intracranial process.
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
## 2020-01-15 IMAGING — DX PORTABLE CHEST - 1 VIEW
1 series · 1 of 1 positions shown · non-contrast
Comparison: Yesterday

CLINICAL DATA: Endotracheal intubation

EXAM:
PORTABLE CHEST 1 VIEW

[chest]
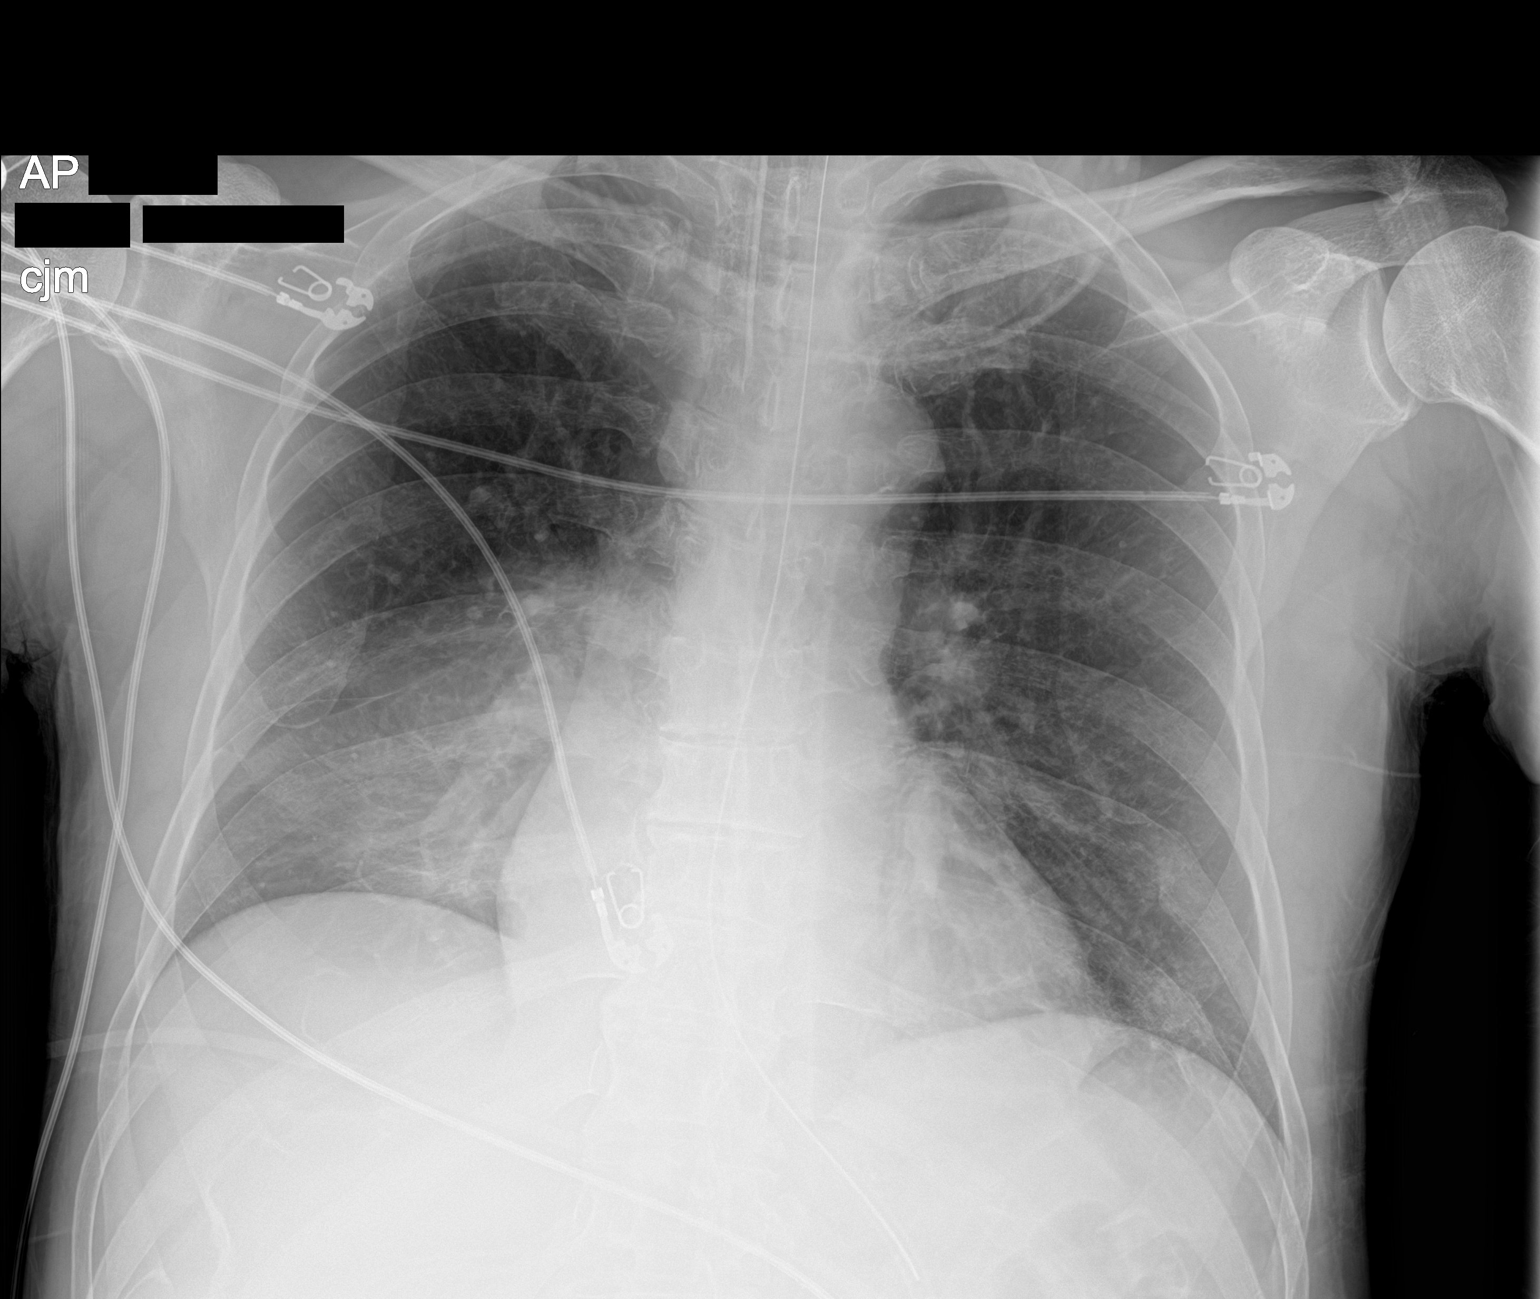

[1 of 1 positions shown; findings below may reference images not displayed]

FINDINGS: Endotracheal tube tip just below the clavicular heads. The
orogastric tube reaches the stomach.

Stable hazy opacity of the lower right chest. No edema, effusion, or
pneumothorax. Normal heart size.
IMPRESSION: Stable hardware positioning and right-sided pneumonia.

## 2020-01-16 IMAGING — DX PORTABLE CHEST - 1 VIEW
1 series · 1 of 1 positions shown · non-contrast
Comparison: 01/29/2019.

CLINICAL DATA: Pneumonia.

EXAM:
PORTABLE CHEST 1 VIEW

[chest ap]
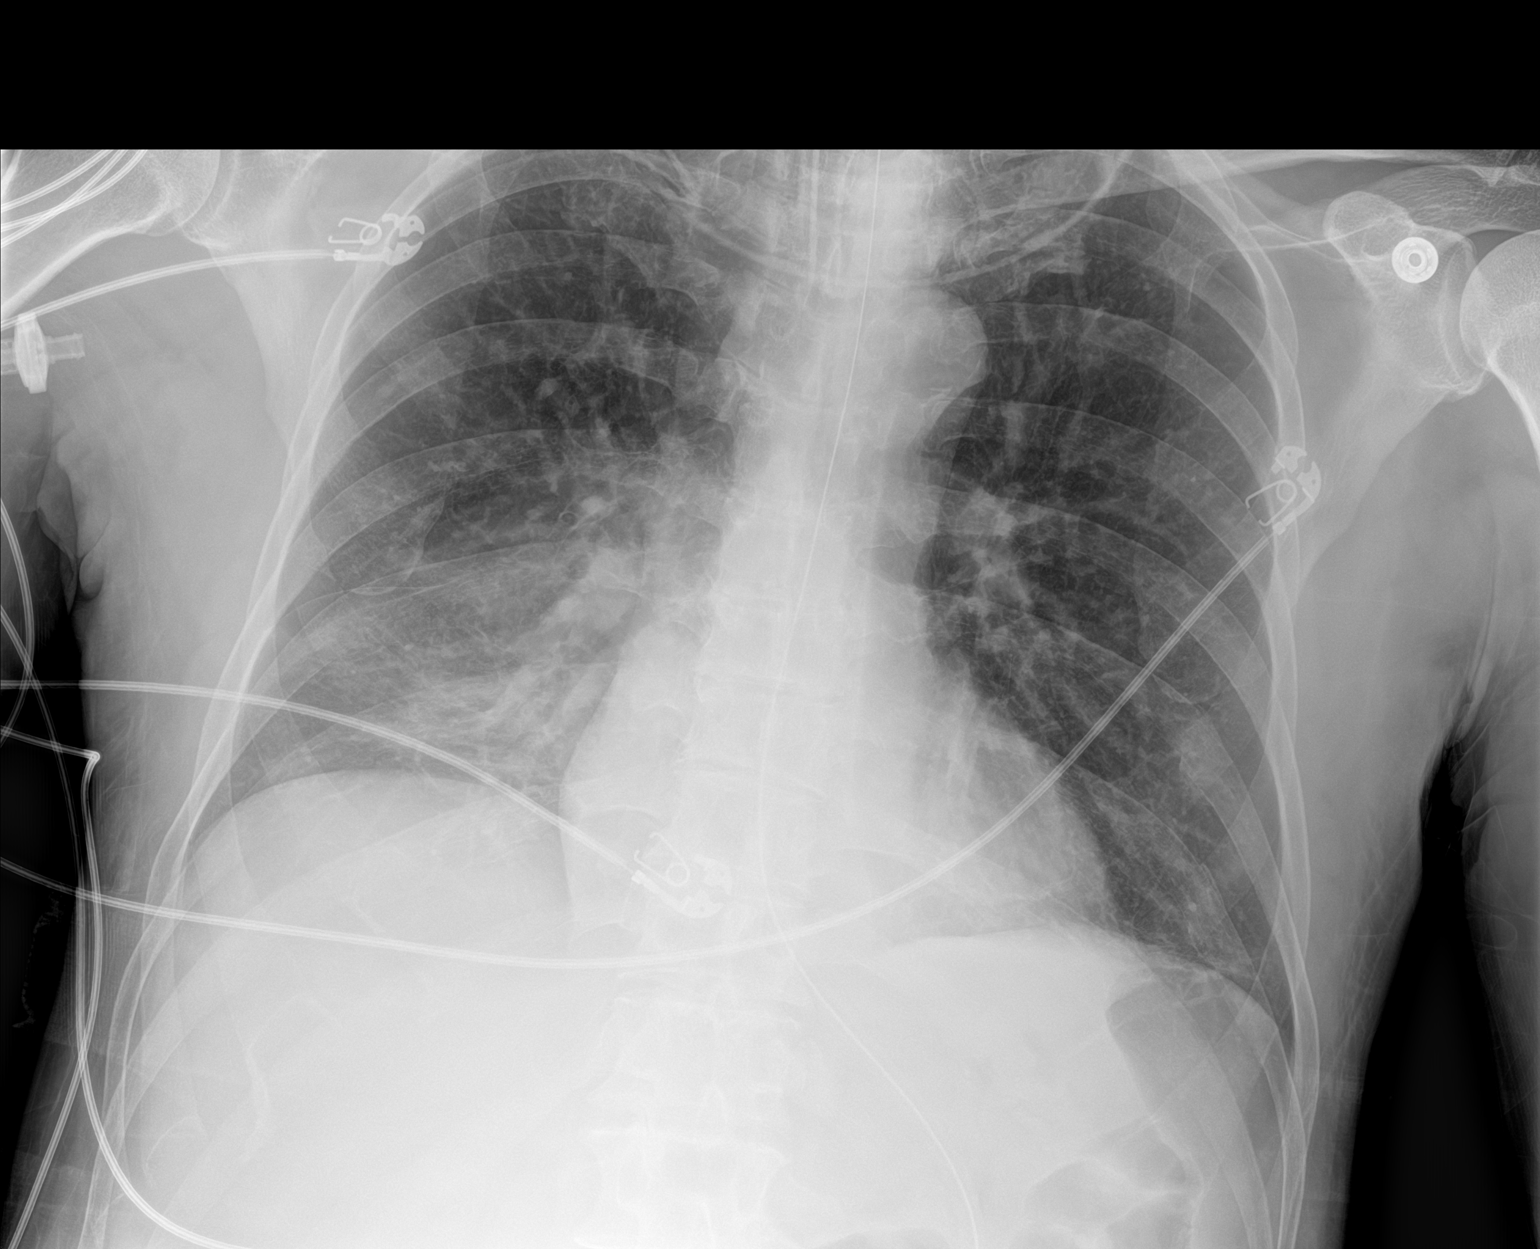

[1 of 1 positions shown; findings below may reference images not displayed]

FINDINGS: Unchanged cardiomediastinal silhouette. Support tubes and lines are
stable. RIGHT base opacity, likely infiltrate plus atelectasis.
IMPRESSION: Stable RIGHT base infiltrate.  Stable support apparatus.

## 2020-01-17 IMAGING — DX CHEST - 2 VIEW SAME DAY
1 series · 1 of 1 positions shown · non-contrast
Comparison: 01/30/2019 and prior radiographs

CLINICAL DATA: Respiratory failure.

EXAM:
CHEST - 2 VIEW SAME DAY

[chest lat]
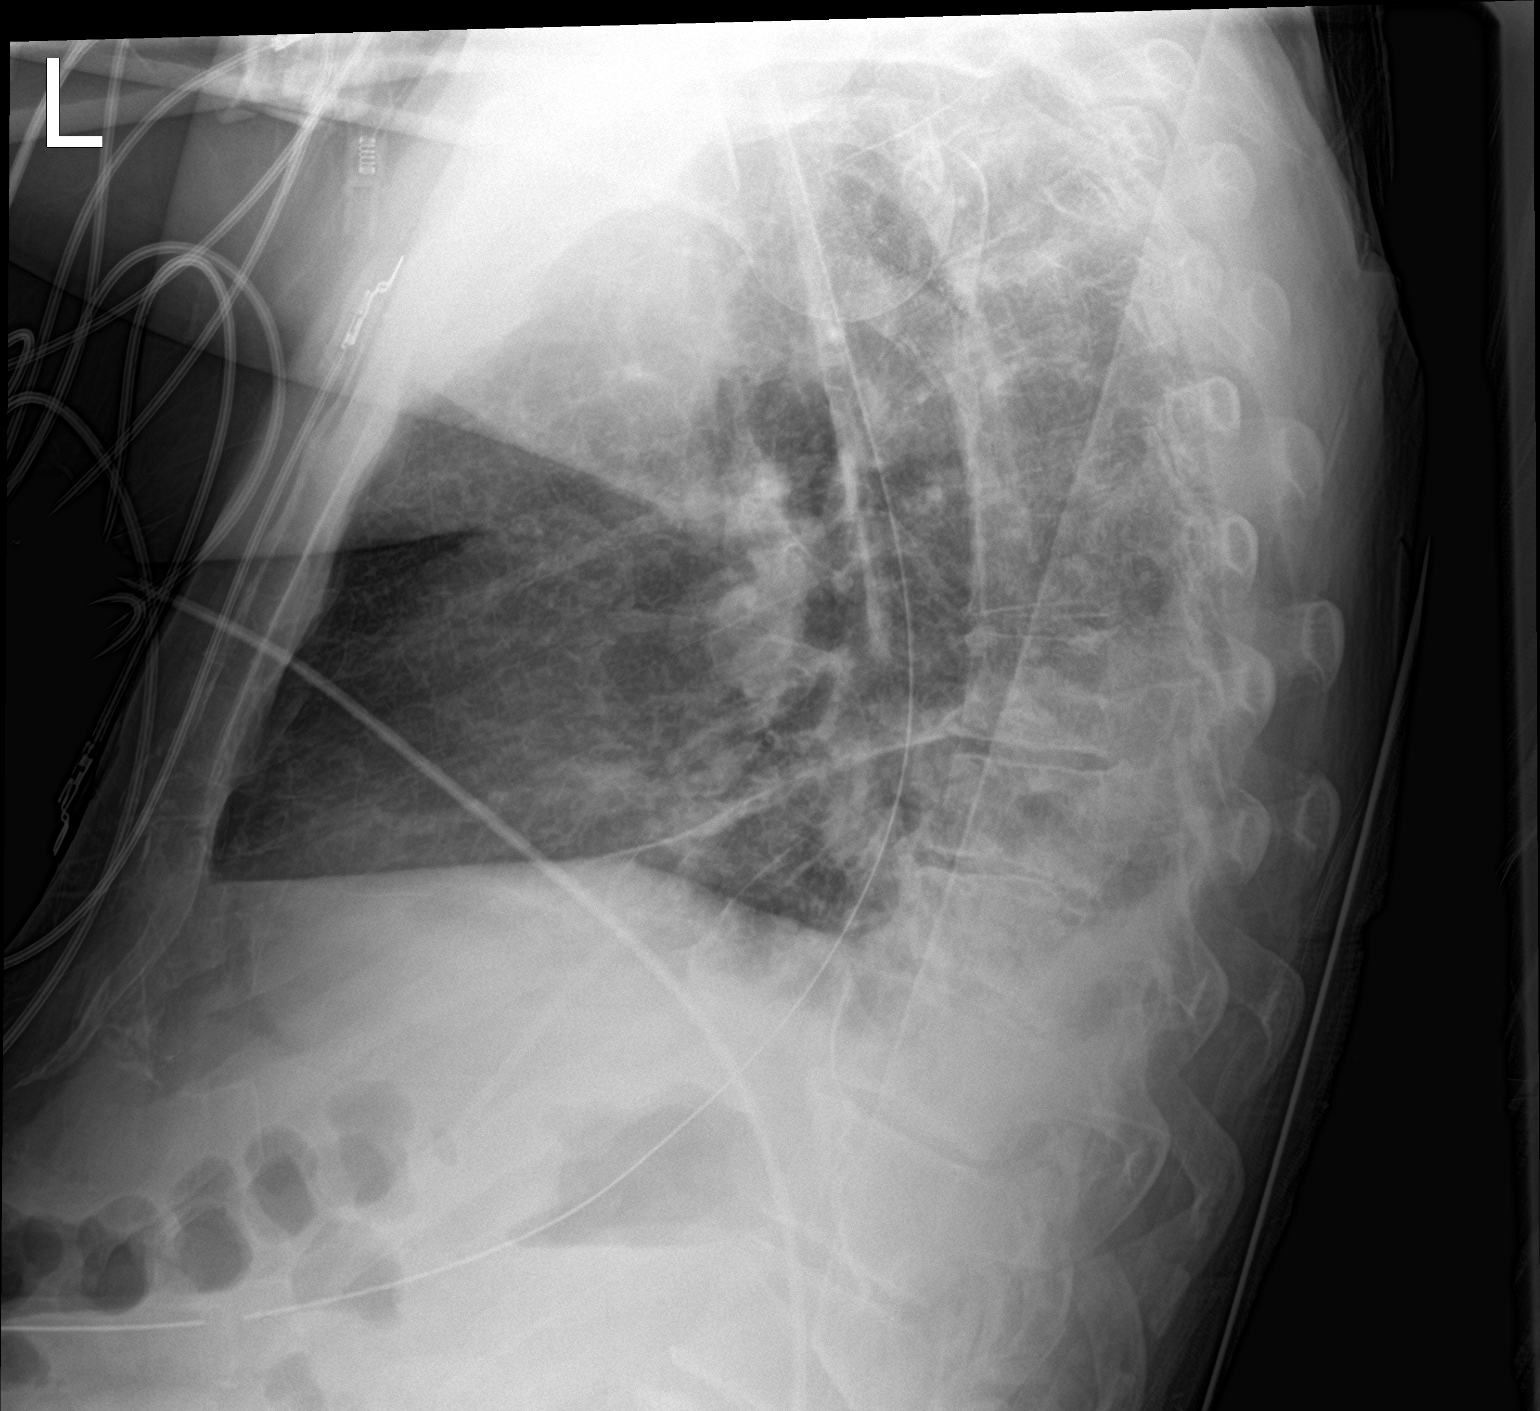

[1 of 1 positions shown; findings below may reference images not displayed]

FINDINGS: An endotracheal tube with tip 3 cm above the carina and small bore
feeding tube entering the stomach with tip off the field of view
again noted.

UPPER limits normal heart size and pulmonary vascular congestion
again noted.

Bilateral LOWER lung lobe opacities are noted, increased on the
LEFT.

Probable small effusions noted.
IMPRESSION: 1. Bilateral LOWER lung lobe opacities, stable in the RIGHT and
increased on the LEFT-question airspace disease and/or atelectasis.
2. UPPER limits normal heart size, pulmonary vascular congestion and
probable small bilateral pleural effusions.

## 2020-01-19 IMAGING — DX PORTABLE CHEST - 1 VIEW
1 series · 1 of 1 positions shown · non-contrast
Comparison: Radiographs January 31, 2019.

CLINICAL DATA: Respiratory failure.

EXAM:
PORTABLE CHEST 1 VIEW

[chest ap]
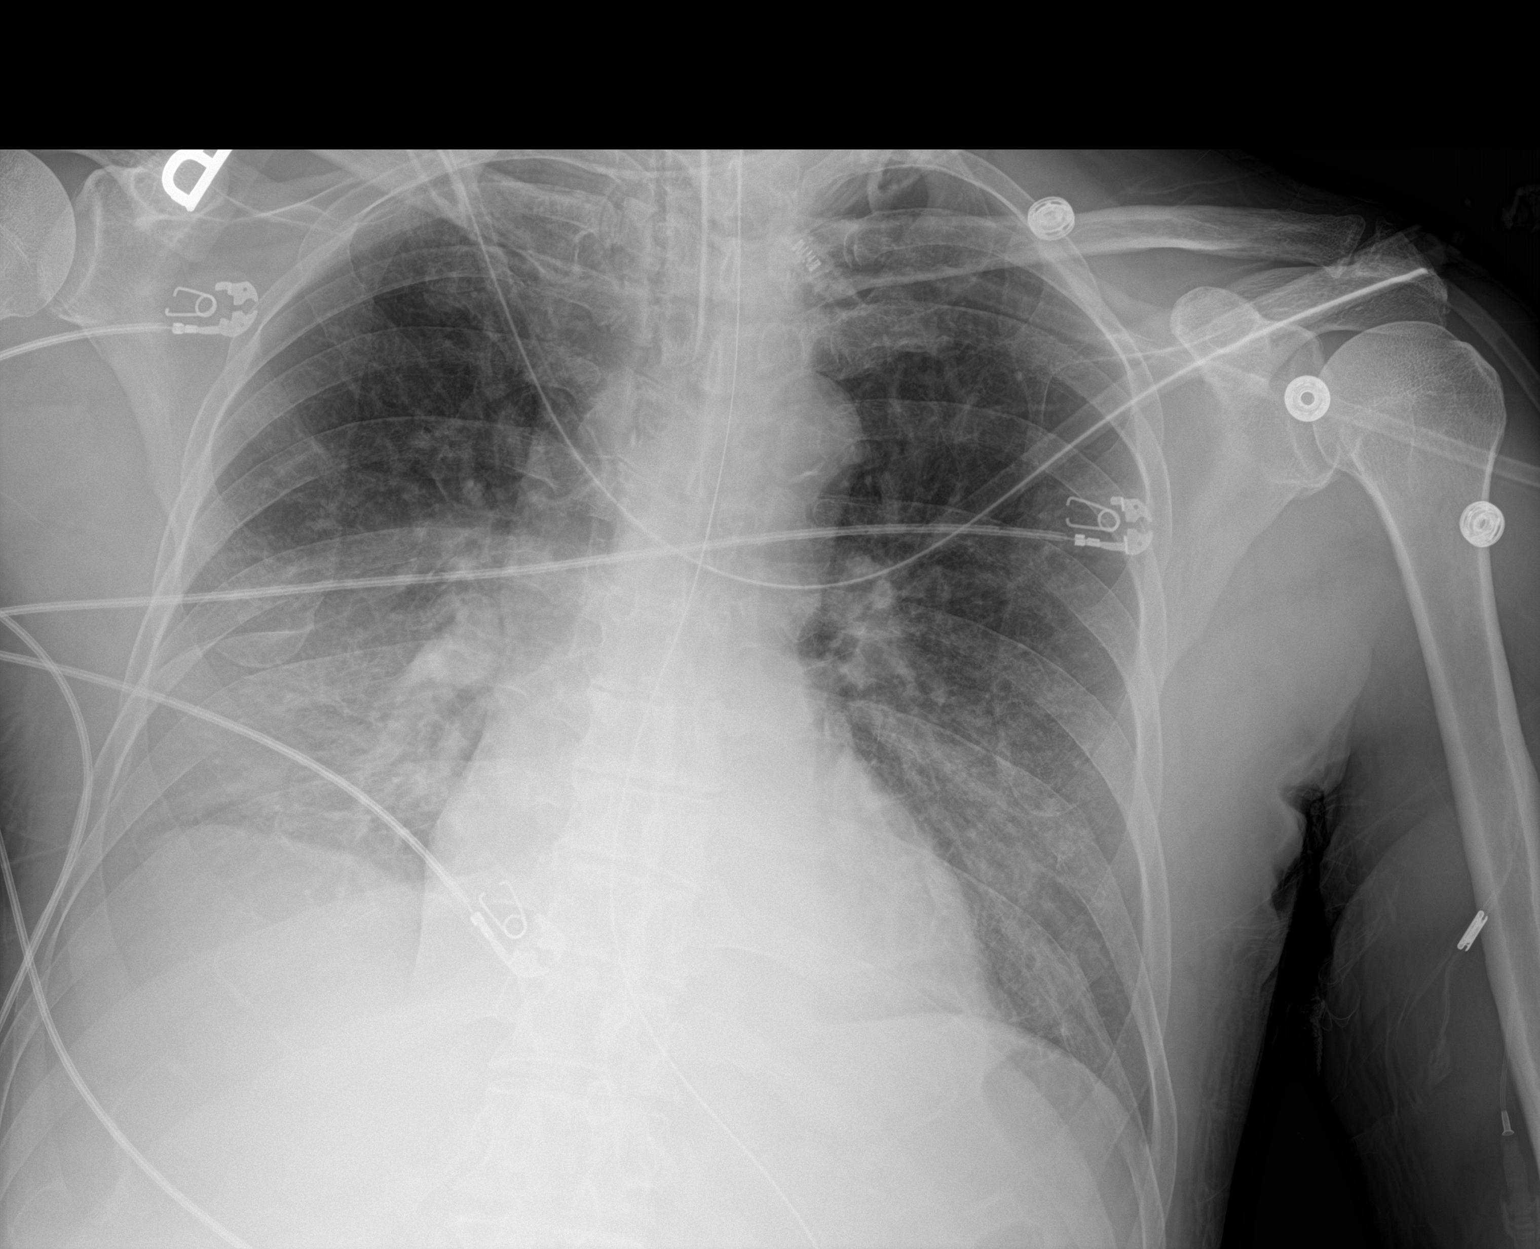

[1 of 1 positions shown; findings below may reference images not displayed]

FINDINGS: Stable cardiomediastinal silhouette. Endotracheal and nasogastric
tubes are unchanged in position. No pneumothorax is noted. Bilateral
lung opacities are noted, right greater than left, concerning for
subsegmental atelectasis or possibly infiltrates. Some degree of
right pleural effusion is noted. Bony thorax is unremarkable.
IMPRESSION: Stable support apparatus. Bilateral lung opacities are noted, right
greater than left as described above.

## 2020-01-21 IMAGING — DX PORTABLE CHEST - 1 VIEW
1 series · 1 of 1 positions shown · non-contrast
Comparison: 02/02/2019

CLINICAL DATA: Evaluate endotracheal tube position. Increased
secretions.

EXAM:
PORTABLE CHEST 1 VIEW

[chest ap]
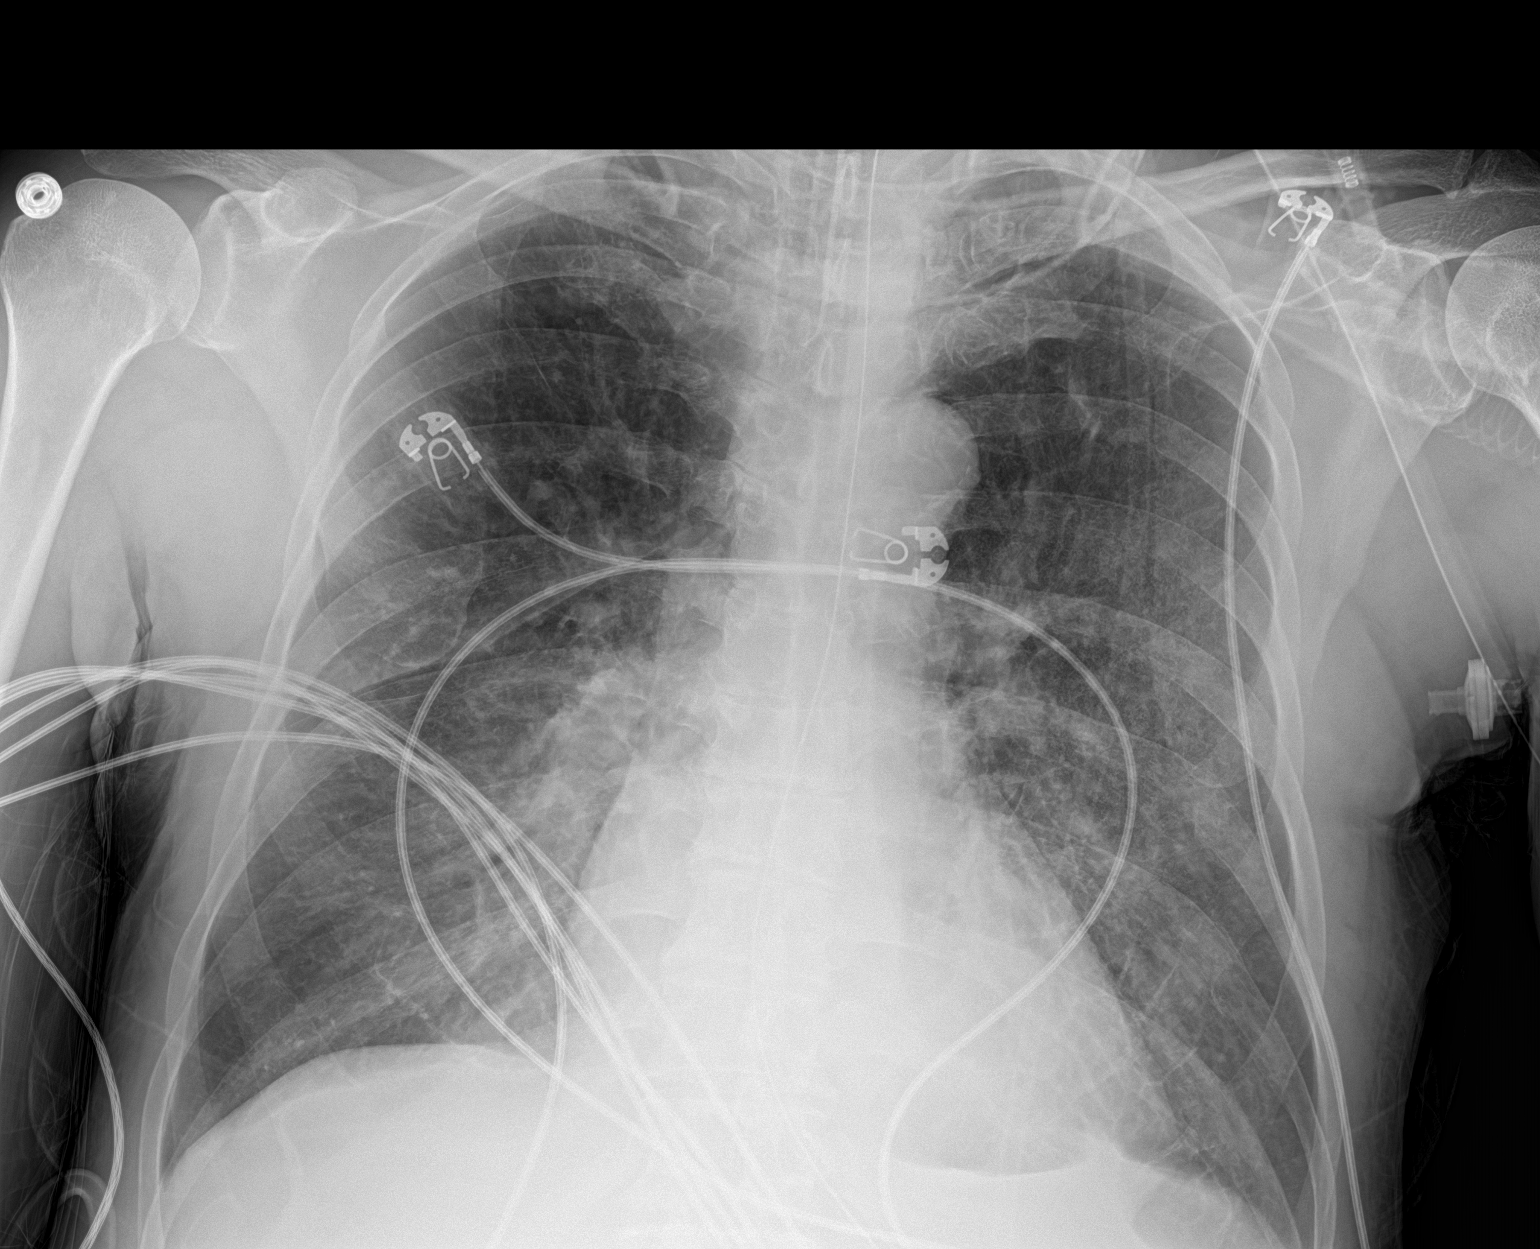

[1 of 1 positions shown; findings below may reference images not displayed]

FINDINGS: Endotracheal tube terminates 6.2 cm above carina. Nasogastric tube
extends beyond the inferior aspect of the film.

Midline trachea. Normal heart size. Atherosclerosis in the
transverse aorta. Left costophrenic angle minimally excluded. No
pleural effusion or pneumothorax. Minimal improvement in aeration,
with bilateral interstitial and airspace disease remaining, lower
lung predominant.
IMPRESSION: Slightly improved aeration, likely due to decreased pulmonary edema.

Appropriate position of endotracheal tube.

Aortic Atherosclerosis ([id number]).
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
## 2020-01-24 IMAGING — DX PORTABLE CHEST - 1 VIEW
1 series · 1 of 1 positions shown · non-contrast
Comparison: 02/06/2019

CLINICAL DATA: Healthcare associated pneumonia

EXAM:
PORTABLE CHEST 1 VIEW

[chest ap]
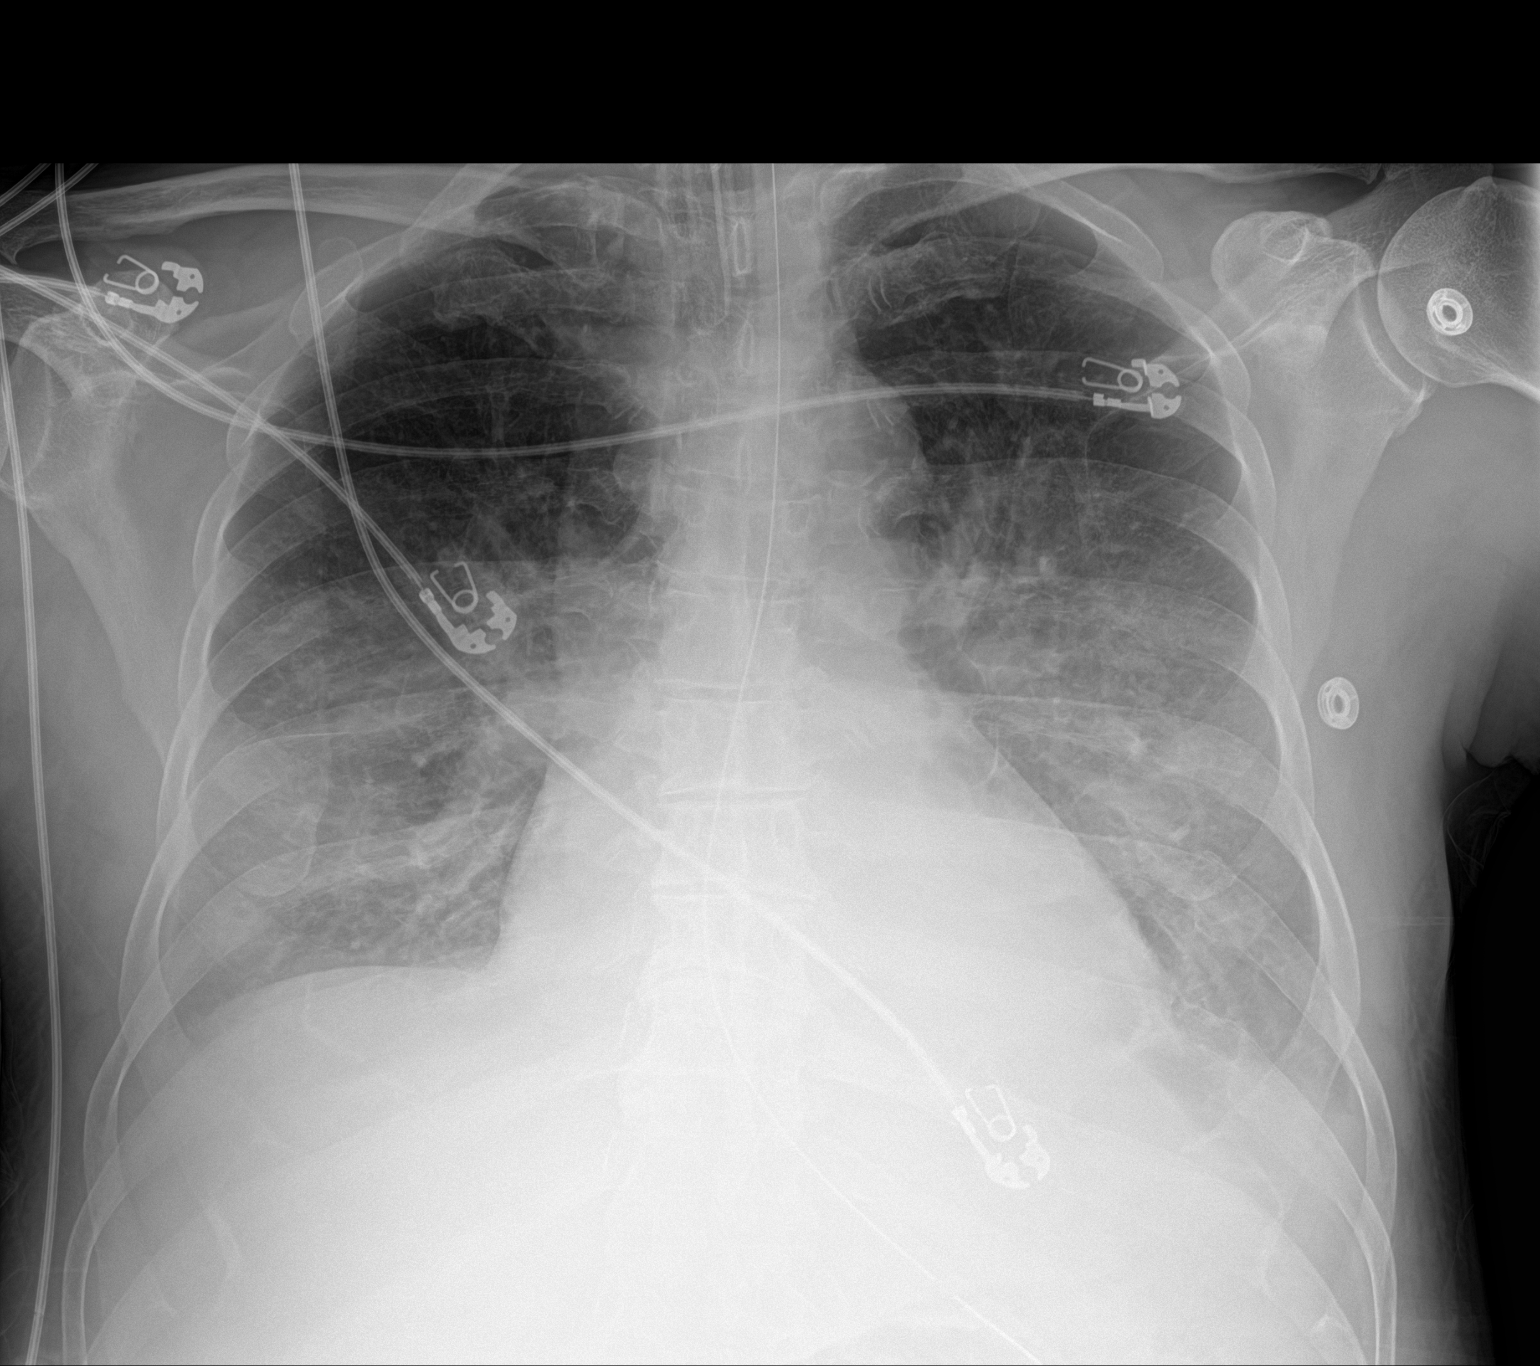

[1 of 1 positions shown; findings below may reference images not displayed]

FINDINGS: Endotracheal tube terminates 6.3 cm above the carina.

Enteric tube courses below the diaphragm.

Hazy bilateral lower lung opacities, possibly reflecting layering
pleural effusions. No frank interstitial edema. No pneumothorax.

The heart is normal in size.
IMPRESSION: Endotracheal tube terminates 6.3 cm above the carina.

Possible layering bilateral pleural effusions.

## 2020-01-25 IMAGING — CR PORTABLE CHEST - 1 VIEW
1 series · 1 of 1 positions shown · non-contrast
Comparison: 02/07/2019

CLINICAL DATA: Ventilated

EXAM:
PORTABLE CHEST 1 VIEW

[AP]
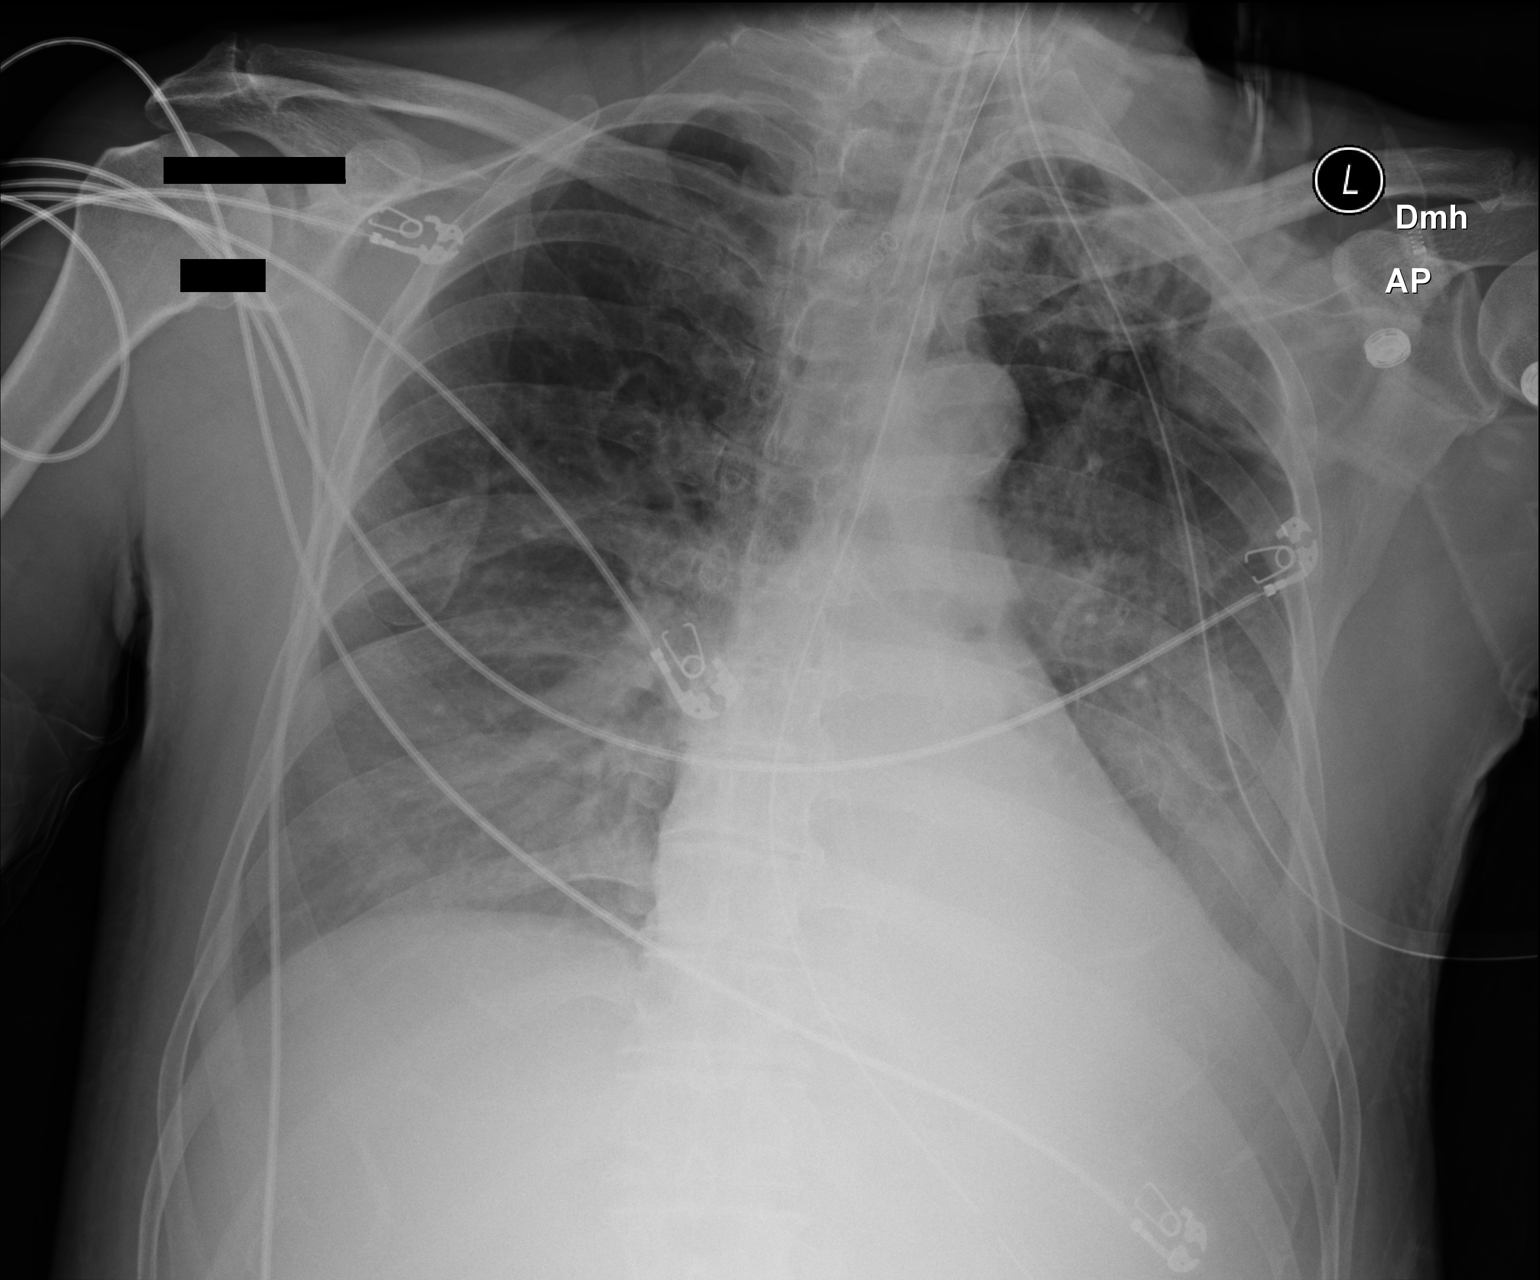

[1 of 1 positions shown; findings below may reference images not displayed]

FINDINGS: Endotracheal tube terminates 6.5 cm above the carina.

Small to moderate layering pleural effusions, mildly decreased. No
pneumothorax.

The heart is normal in size.

Enteric tube terminates in the proximal stomach.
IMPRESSION: Endotracheal tube terminates 6.5 cm above the carina.

Small to moderate layering bilateral pleural effusions, mildly
decreased.

## 2020-01-26 IMAGING — DX PORTABLE CHEST - 1 VIEW
1 series · 2 of 2 positions shown · non-contrast
Comparison: Yesterday

CLINICAL DATA: Brain injury.

EXAM:
PORTABLE CHEST 1 VIEW

[Series 1: chest ap · 0.14mm/px · 2 of 2 slices shown]
[im 1/2]
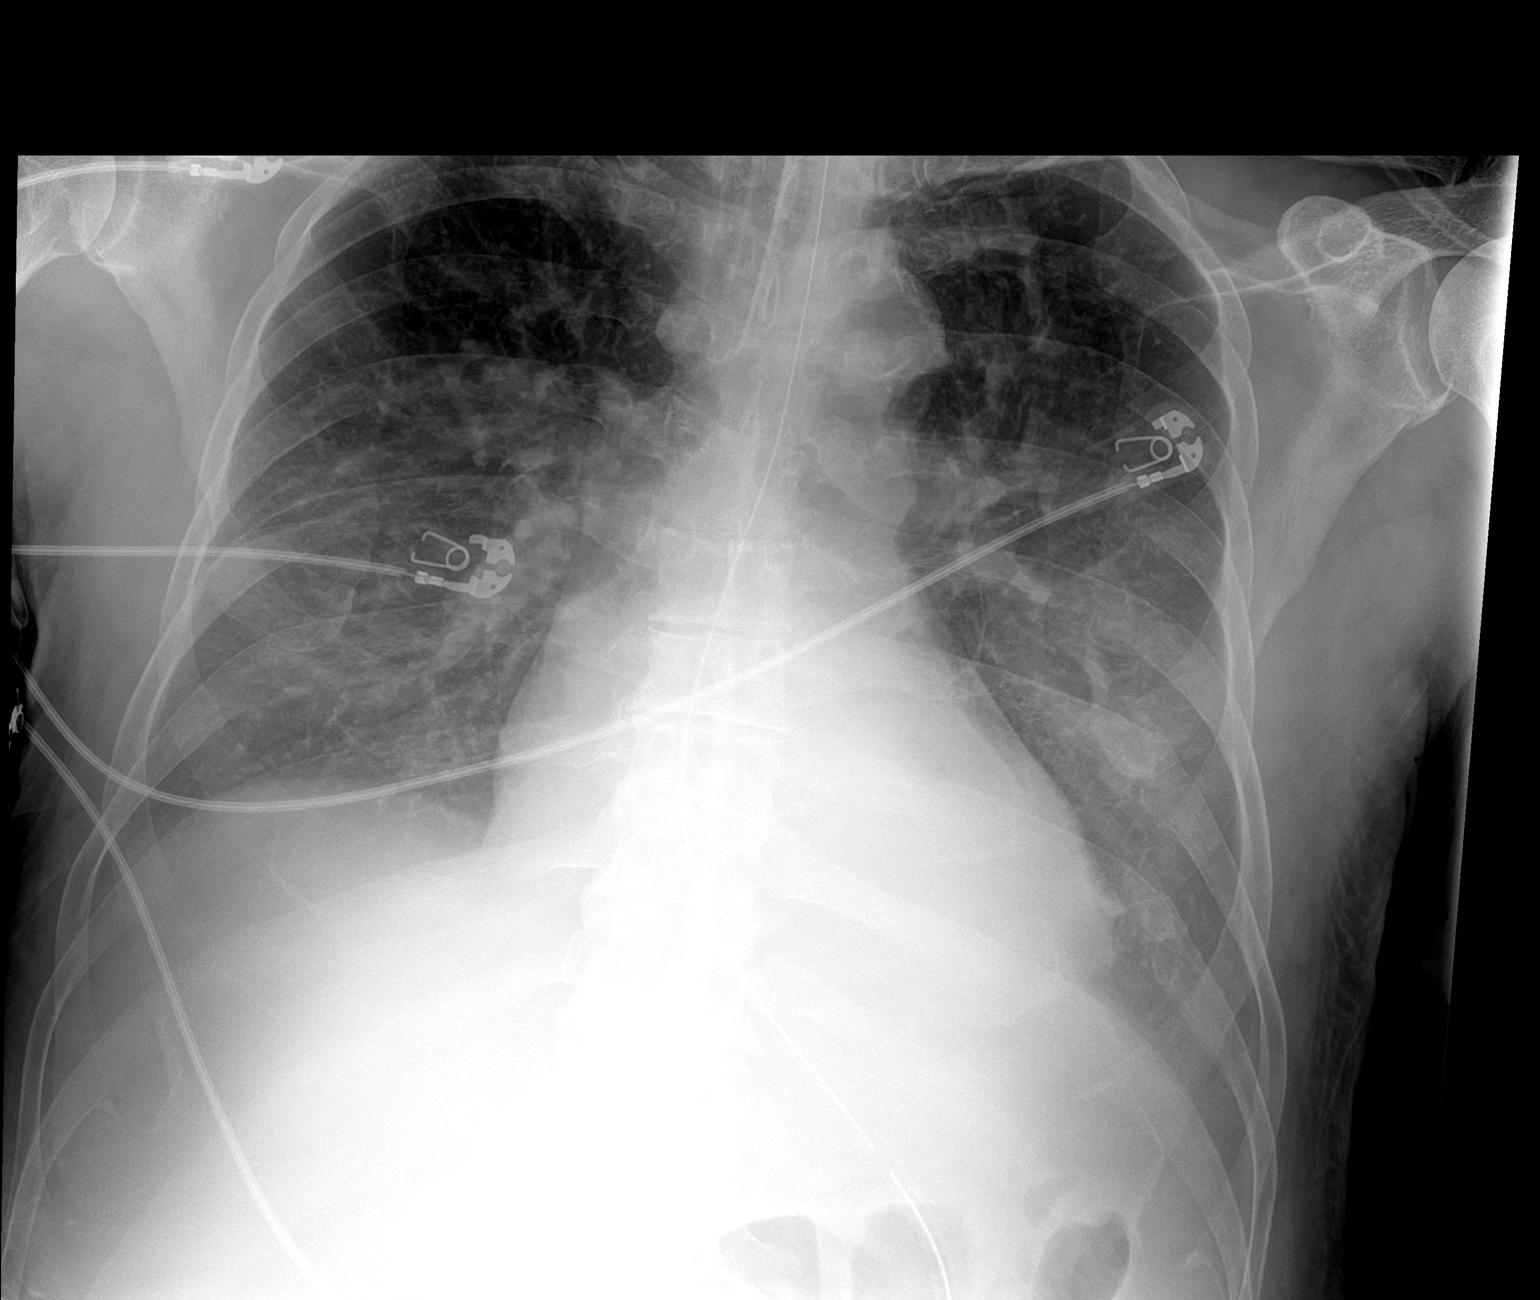
[im 2/2]
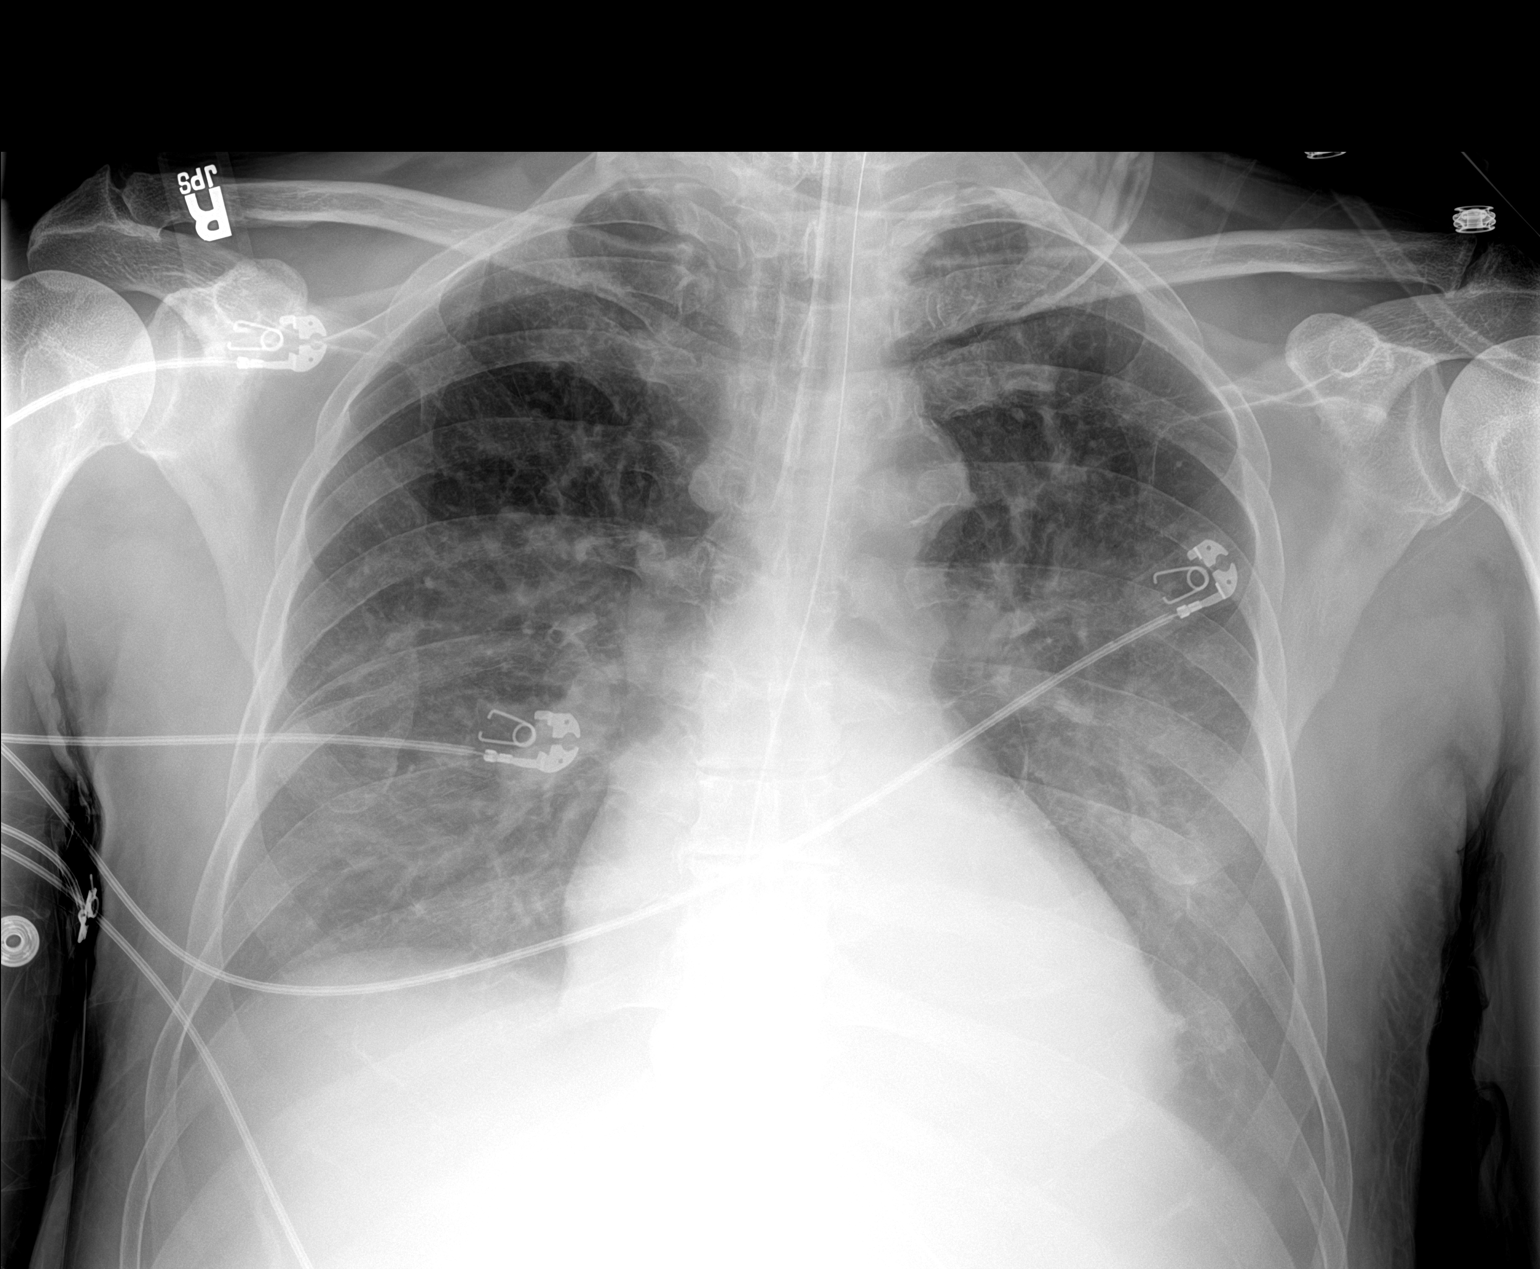

[2 of 2 positions shown; findings below may reference images not displayed]

FINDINGS: Endotracheal tube tip is 2 cm above the carina. The orogastric tube
reaches the stomach.

Haziness of the bilateral chest, likely atelectasis and layering
pleural fluid. No Kerley lines or air bronchogram. Normal heart
size.
IMPRESSION: 1. Unremarkable hardware positioning.
2. Haziness of the bilateral chest that is usually from atelectasis
and pleural fluid.
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
## 2020-01-28 IMAGING — DX PORTABLE CHEST - 1 VIEW
1 series · 1 of 1 positions shown · non-contrast
Comparison: Yesterday

CLINICAL DATA: Pneumonia follow-up

EXAM:
PORTABLE CHEST 1 VIEW

[chest ap]
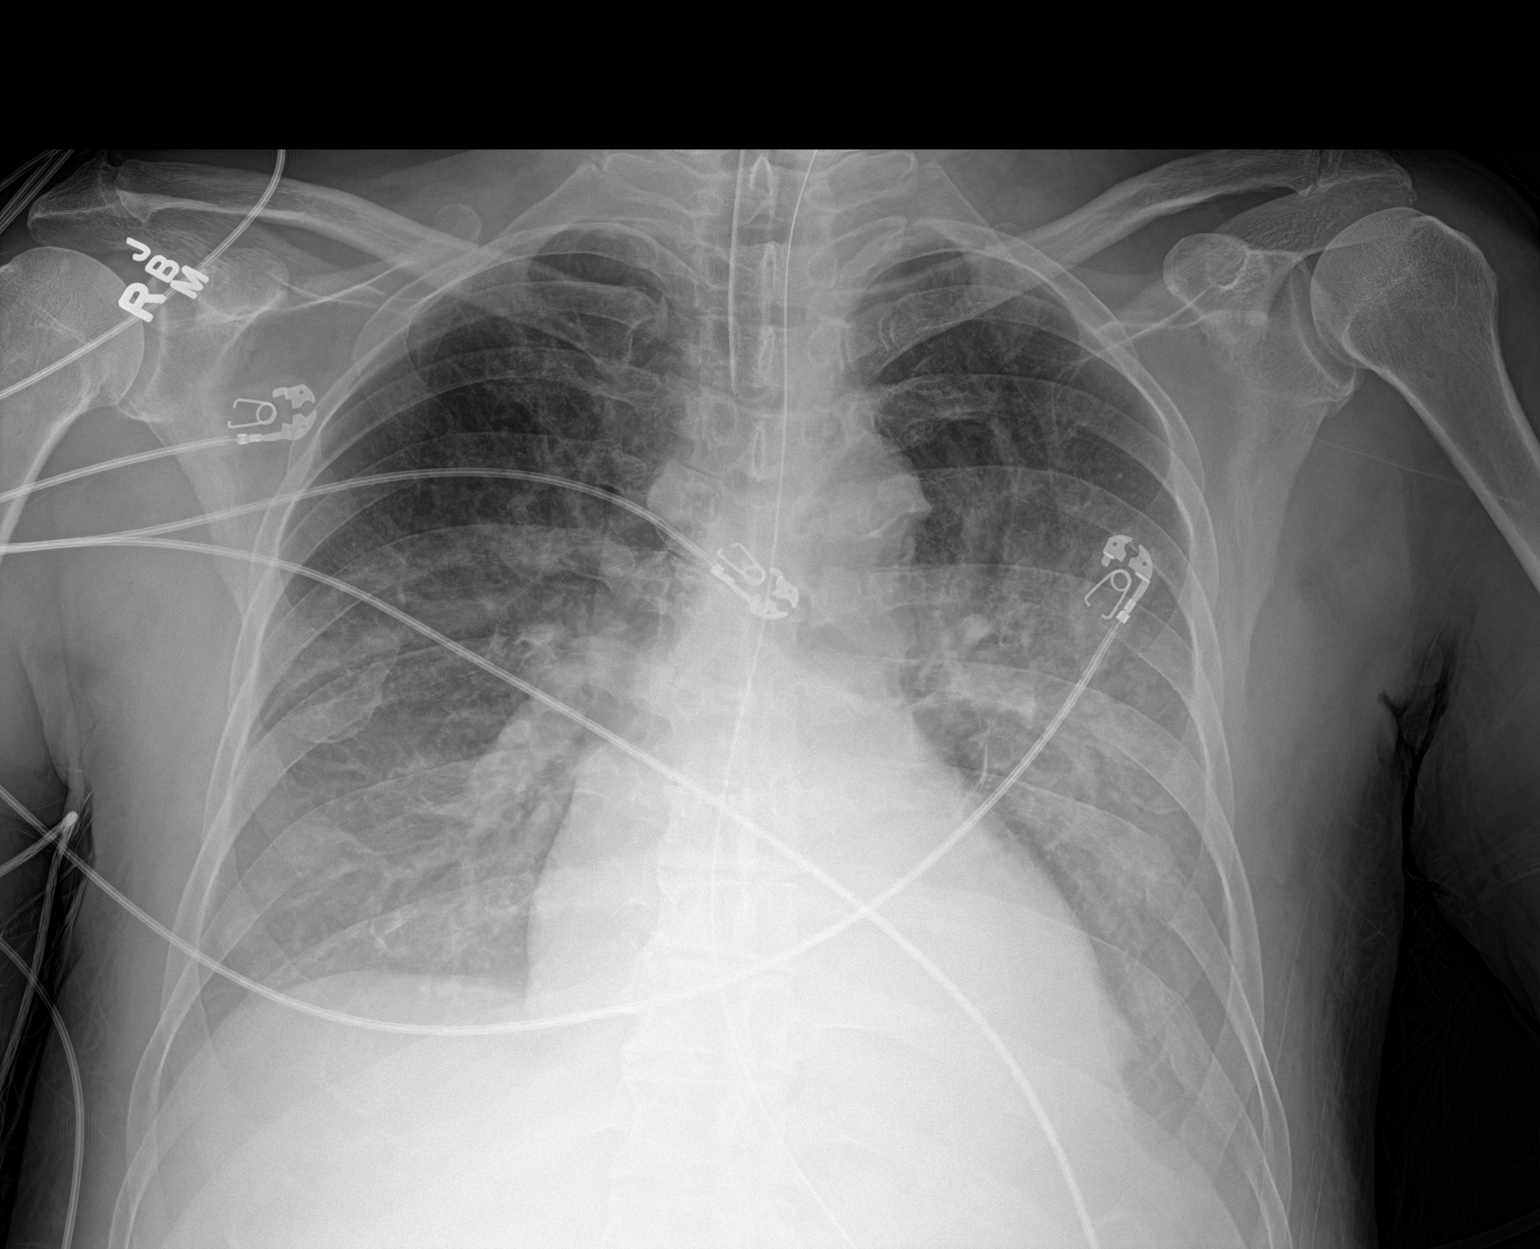

[1 of 1 positions shown; findings below may reference images not displayed]

FINDINGS: Endotracheal tube tip at the clavicular heads. Orogastric tube which
at least reaches the stomach.

Normal heart size and mediastinal contours.

Haziness of the bilateral chest usually related atelectasis and
pleural fluid. There is also history of pneumonia.
IMPRESSION: Unremarkable hardware positioning.

History of pneumonia with unchanged hazy chest opacification.
Suspect layering pleural fluid.

## 2020-01-29 IMAGING — CR PORTABLE CHEST - 1 VIEW
1 series · 1 of 1 positions shown · non-contrast
Comparison: 02/11/2019, 02/10/2019

CLINICAL DATA: 57-year-old male with tracheostomy

EXAM:
PORTABLE CHEST 1 VIEW

[AP]
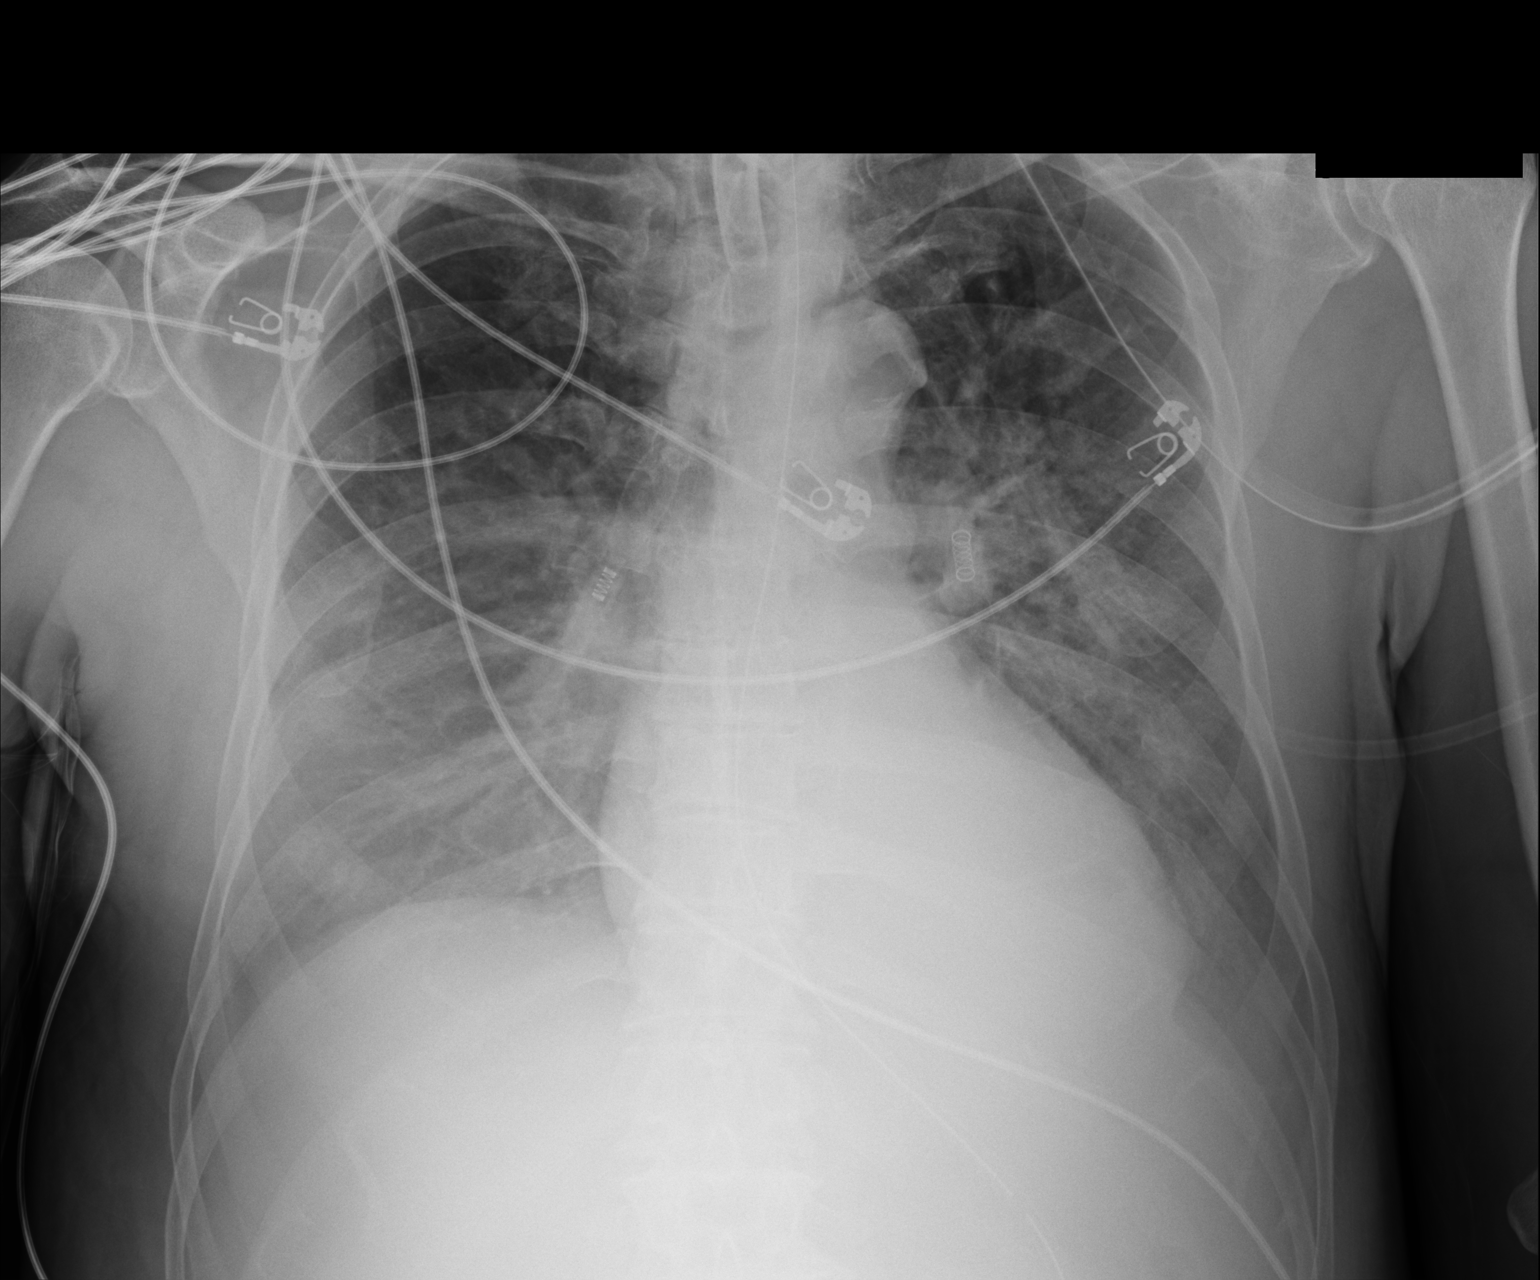

[1 of 1 positions shown; findings below may reference images not displayed]

FINDINGS: Cardiomediastinal silhouette unchanged in size and contour.
Unchanged appearance of gastric tube terminating in the left upper
quadrant out of the field of view.

Interval removal of endotracheal tube and placement of tracheostomy.

Similar appearance of mixed interstitial and airspace disease with
graded opacity towards the lung bases. Blunting of the left
costophrenic angle and right costophrenic angle.
IMPRESSION: Interval removal of endotracheal tube and placement of tracheostomy.

Similar appearance of mixed interstitial and airspace opacities with
likely small bilateral pleural effusions.

Unchanged gastric tube

## 2020-01-30 IMAGING — DX PORTABLE CHEST - 1 VIEW
1 series · 1 of 1 positions shown · non-contrast
Comparison: Radiograph February 12, 2019.

CLINICAL DATA: Respiratory failure.

EXAM:
PORTABLE CHEST 1 VIEW

[chest ap]
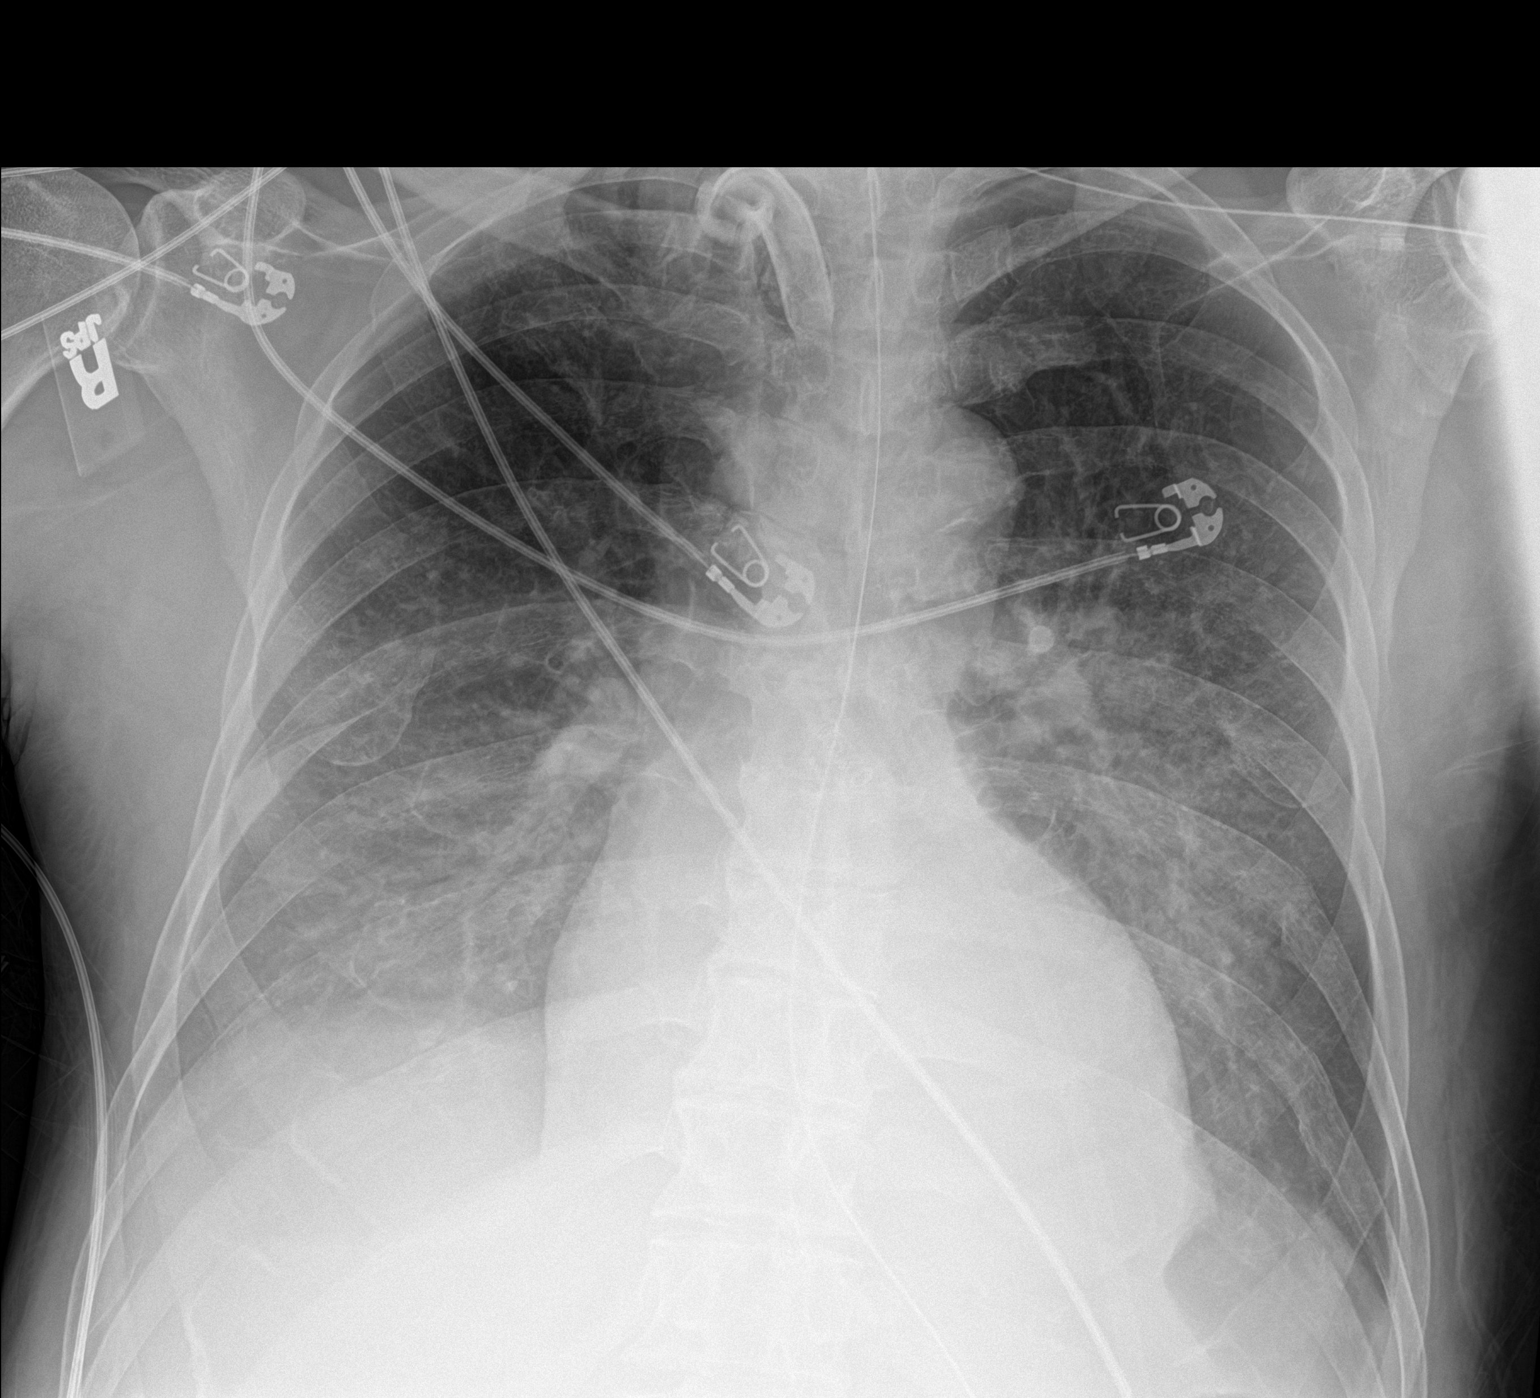

[1 of 1 positions shown; findings below may reference images not displayed]

FINDINGS: Stable cardiomediastinal silhouette. Tracheostomy tube is unchanged
in position. Nasogastric tube is seen entering stomach. No
pneumothorax is noted. Stable bibasilar atelectasis or pneumonia is
noted with probable small pleural effusions. Bony thorax is
unremarkable.
IMPRESSION: Stable support apparatus. Stable bibasilar opacities as described
above.

## 2020-02-08 IMAGING — DX PORTABLE CHEST - 1 VIEW
1 series · 1 of 1 positions shown · non-contrast
Comparison: 02/13/2019, 02/12/2019, 02/10/2019

CLINICAL DATA: New fever

EXAM:
PORTABLE CHEST 1 VIEW

[chest ap]
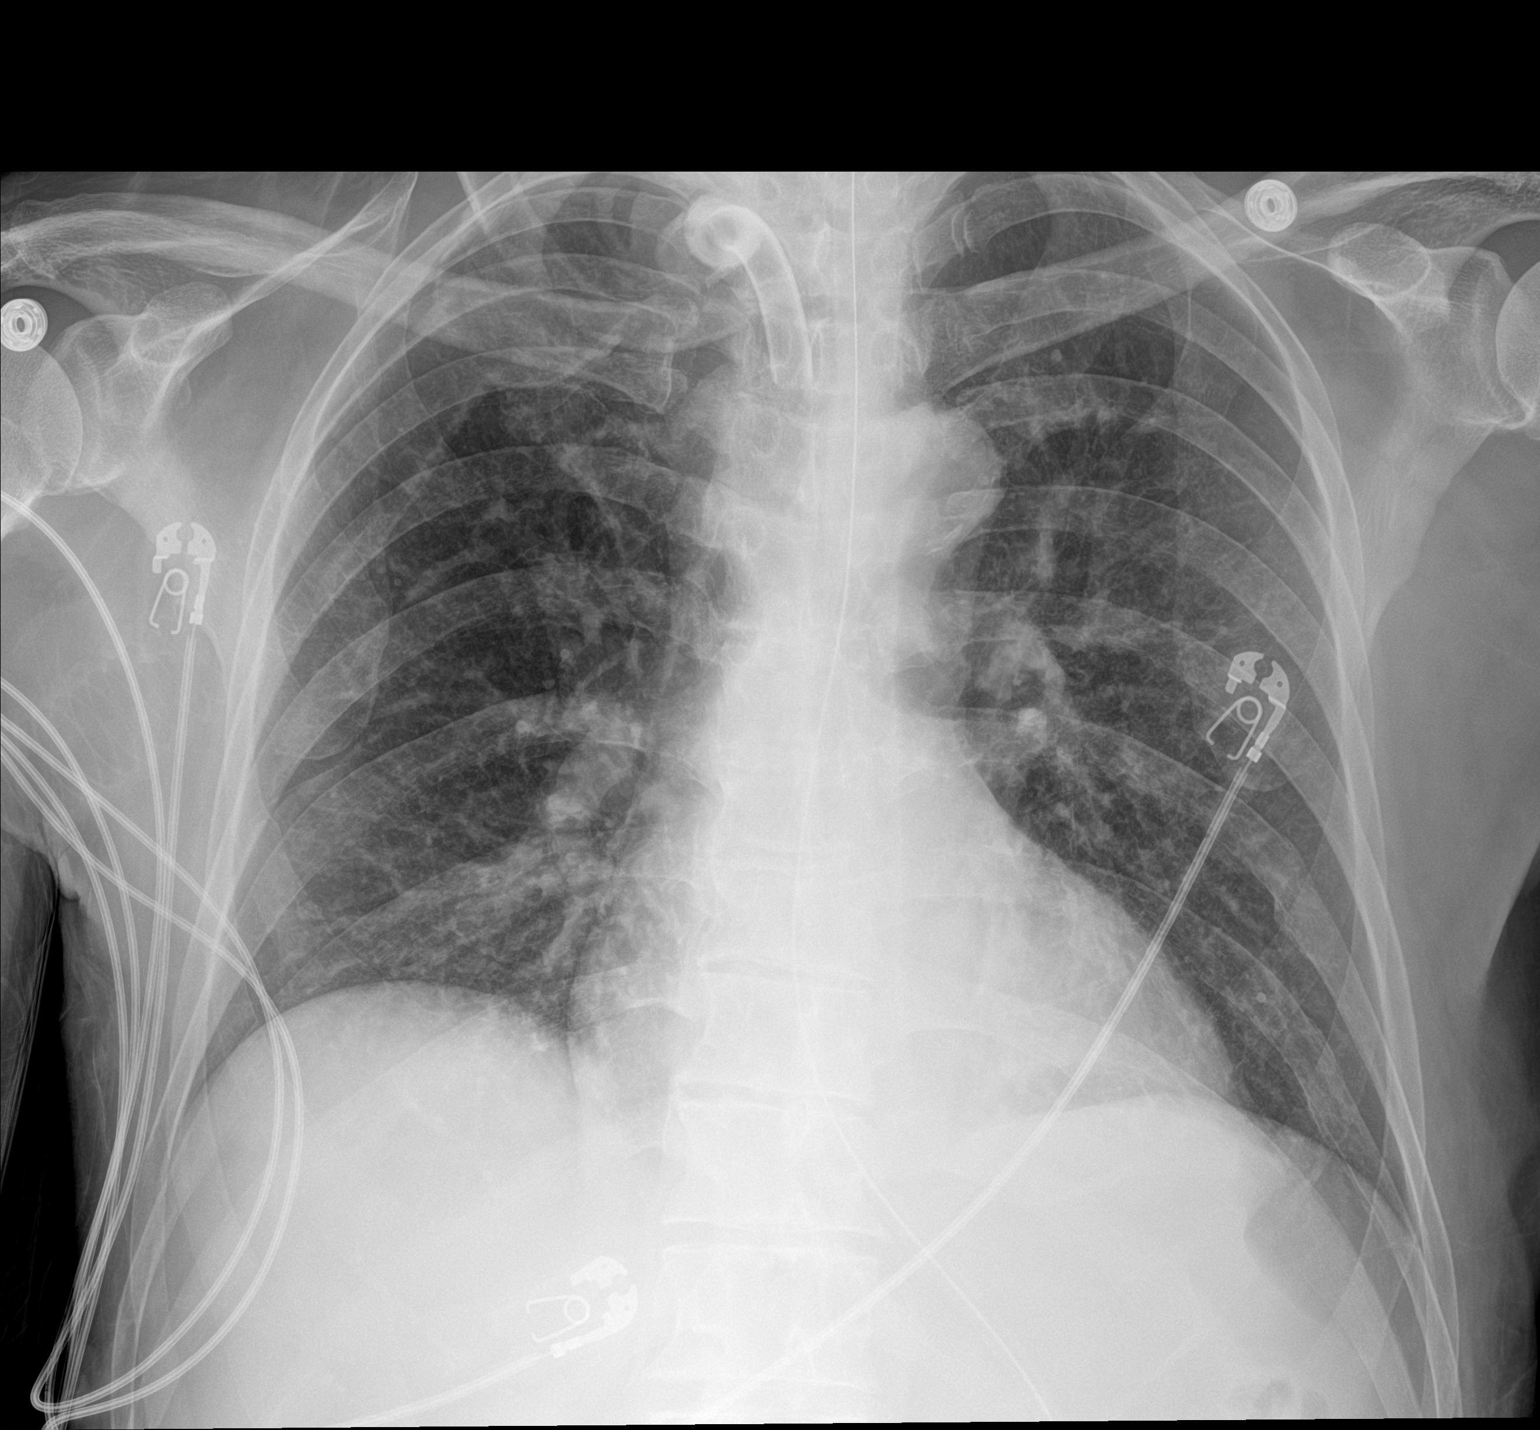

[1 of 1 positions shown; findings below may reference images not displayed]

FINDINGS: Tracheostomy tube remains in place. Esophageal tube tip is below the
diaphragm. Improved aeration since 02/13/2019 with better
visualization of the diaphragms. No pleural effusion. Stable
cardiomediastinal silhouette with aortic atherosclerosis. No
pneumothorax.
IMPRESSION: Improved aeration since the prior examination. No acute focal
airspace disease is visualized.

## 2020-02-10 IMAGING — DX PORTABLE ABDOMEN - 1 VIEW
1 series · 1 of 1 positions shown · non-contrast
Comparison: 01/26/2019

CLINICAL DATA: NG tube placement

EXAM:
PORTABLE ABDOMEN - 1 VIEW

[abdomen kub]
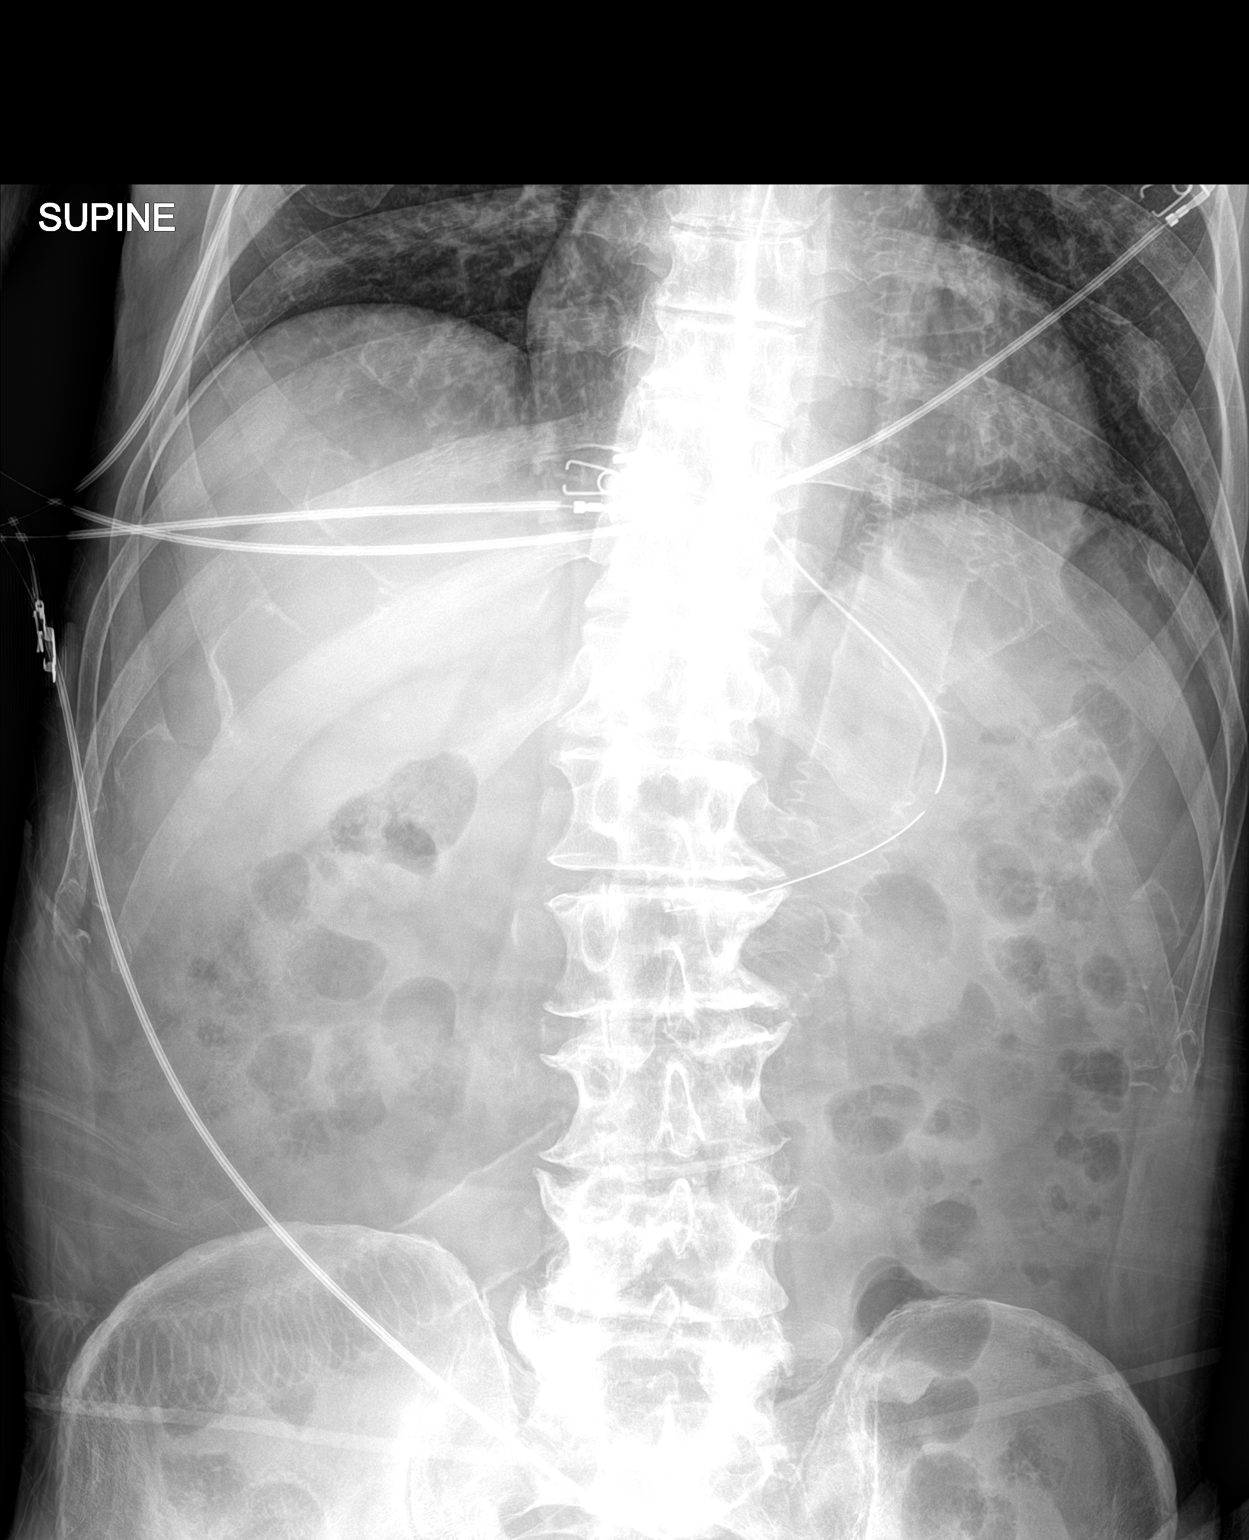

[1 of 1 positions shown; findings below may reference images not displayed]

FINDINGS: The enteric tube tip projects over the gastric body. The tube is
pointed distally. The bowel gas pattern is nonobstructive and
nonspecific.
IMPRESSION: Enteric tube tip projects over the gastric body.

## 2020-02-11 IMAGING — RF ABDOMEN - 1 VIEW
1 series · 1 of 1 positions shown · non-contrast
Comparison: One view abdomen 02/24/2019.

CLINICAL DATA: Feeding tube placement.

EXAM:
ABDOMEN - 1 VIEW

[Series 1: cp_standard · 0.25mm/px · 1 of 1 slices shown]
[im 1/1]
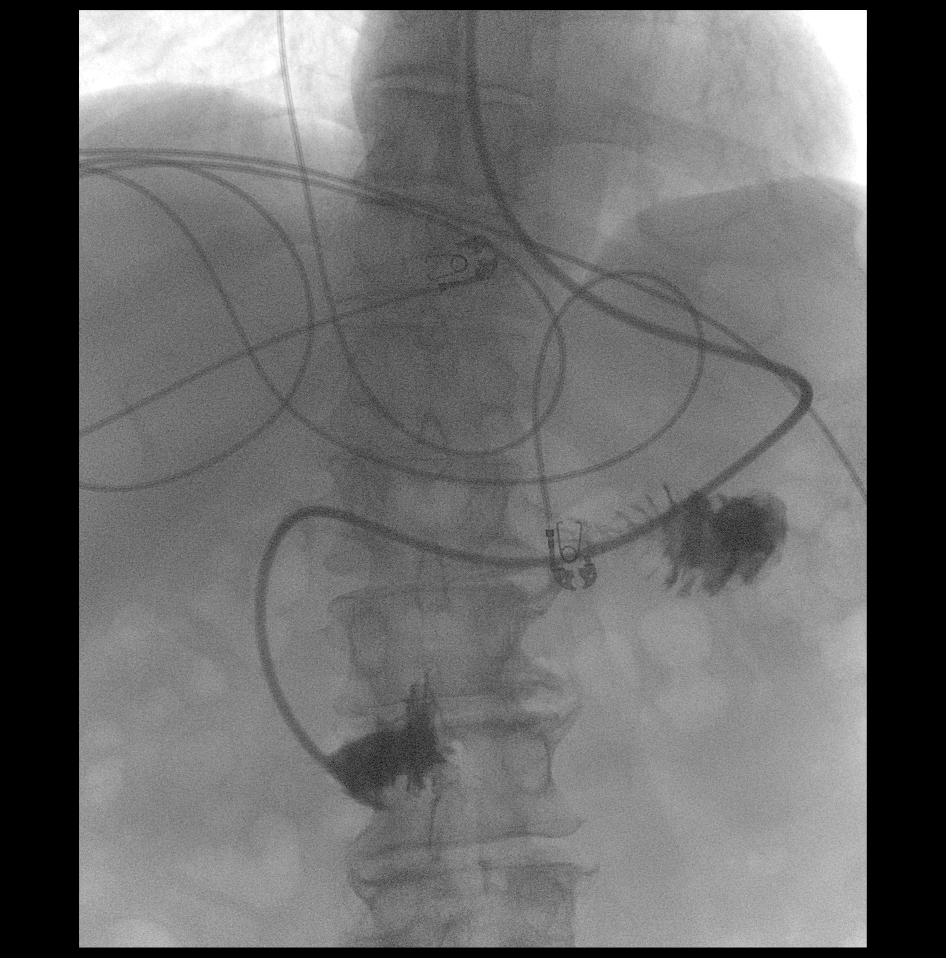

[1 of 1 positions shown; findings below may reference images not displayed]

FINDINGS: A 10 French Cortrek feeding tube was placed by the radiology
technologist. Twenty ml of Omnipaque 300 was utilized to aid
placement.

Fluoroscopy time: 42 seconds.

A single spot fluoroscopic image after feeding tube placement
demonstrates the tip of the tube overlying the lumbar spine in the
3rd portion of the duodenum.
IMPRESSION: Feeding tube placement into the mid duodenum.

## 2020-02-12 IMAGING — DX PORTABLE ABDOMEN - 1 VIEW
1 series · 1 of 1 positions shown · non-contrast
Comparison: 02/25/2011

CLINICAL DATA: Check NG placement

EXAM:
PORTABLE ABDOMEN - 1 VIEW

[abdomen]
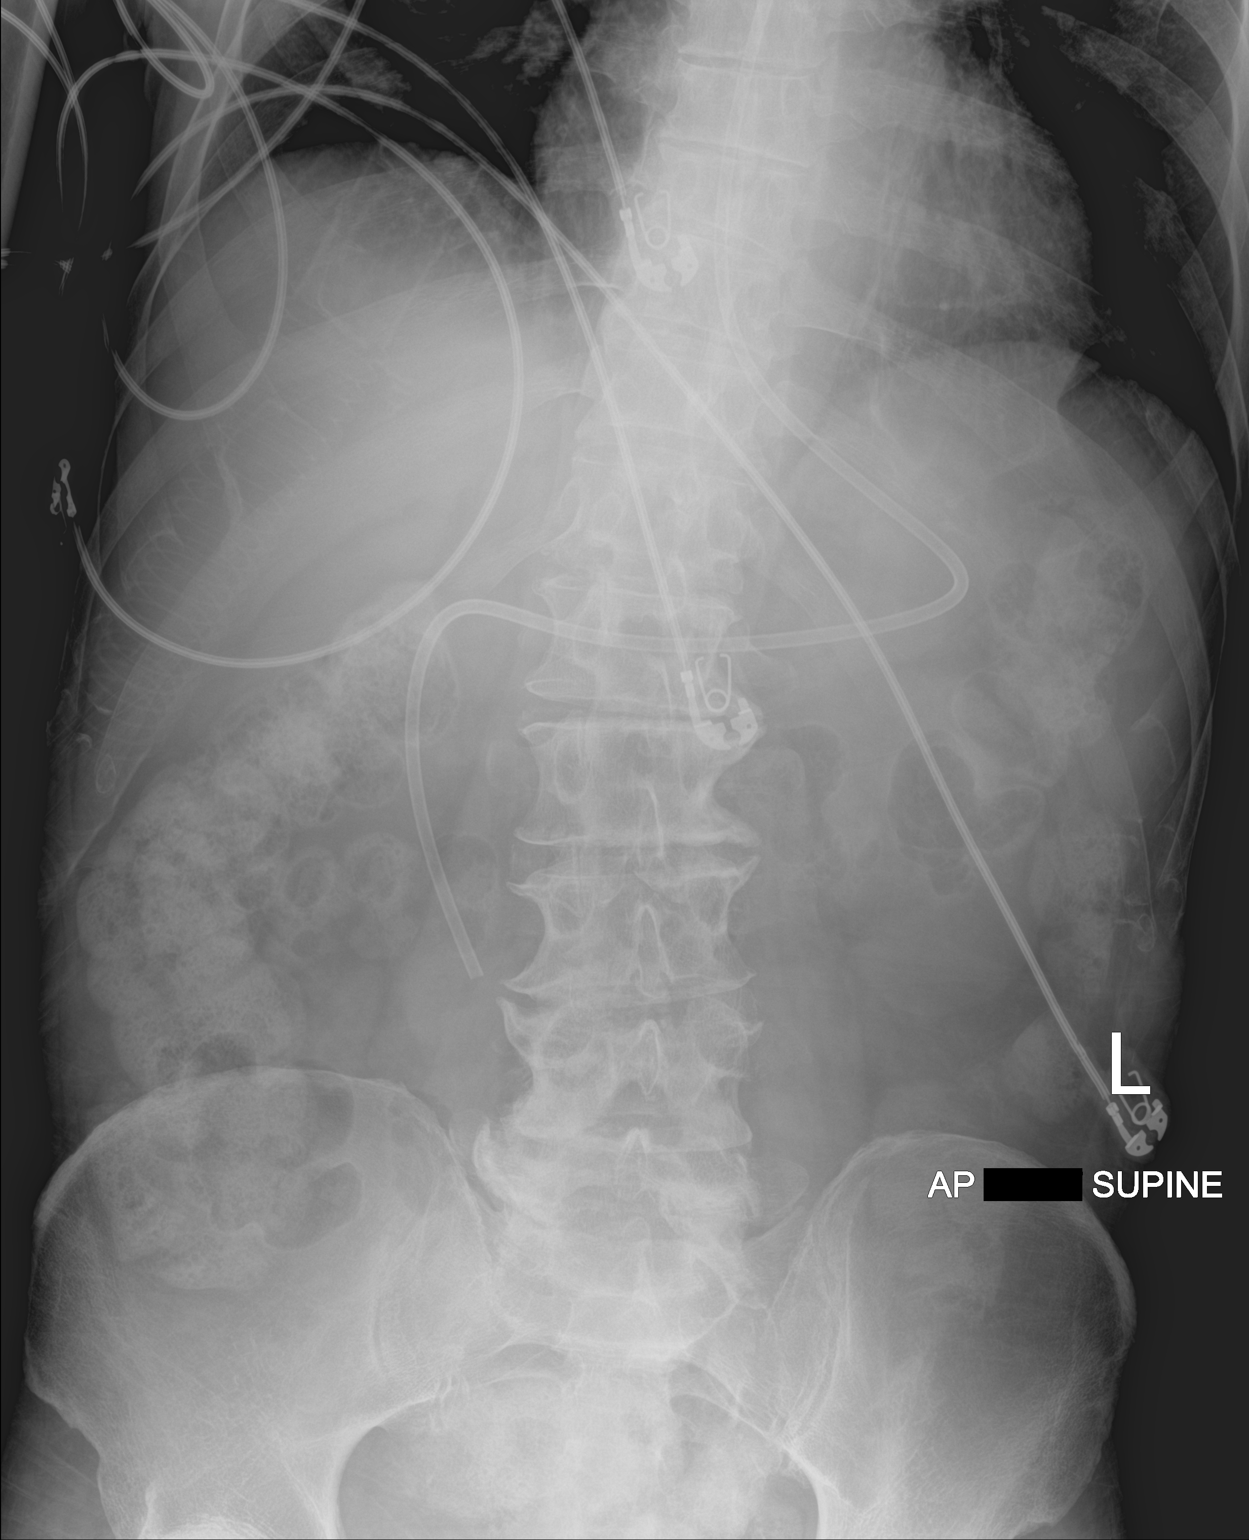

[1 of 1 positions shown; findings below may reference images not displayed]

FINDINGS: Feeding catheter is again noted in the distal aspect of the second
portion of the duodenum. The overall appearance is similar to that
seen on the prior exam. Previously administered contrast during tube
check has been passed into the colon.
IMPRESSION: Feeding catheter in the distal aspect of the second portion of the
duodenum.

## 2020-02-20 IMAGING — DX PORTABLE CHEST - 1 VIEW
1 series · 1 of 1 positions shown · non-contrast
Comparison: Single-view of the chest 02/22/2019, 02/13/2019. PA and
lateral chest 05/03/2018.

CLINICAL DATA: Low-grade fever today.

EXAM:
PORTABLE CHEST 1 VIEW

[chest ap]
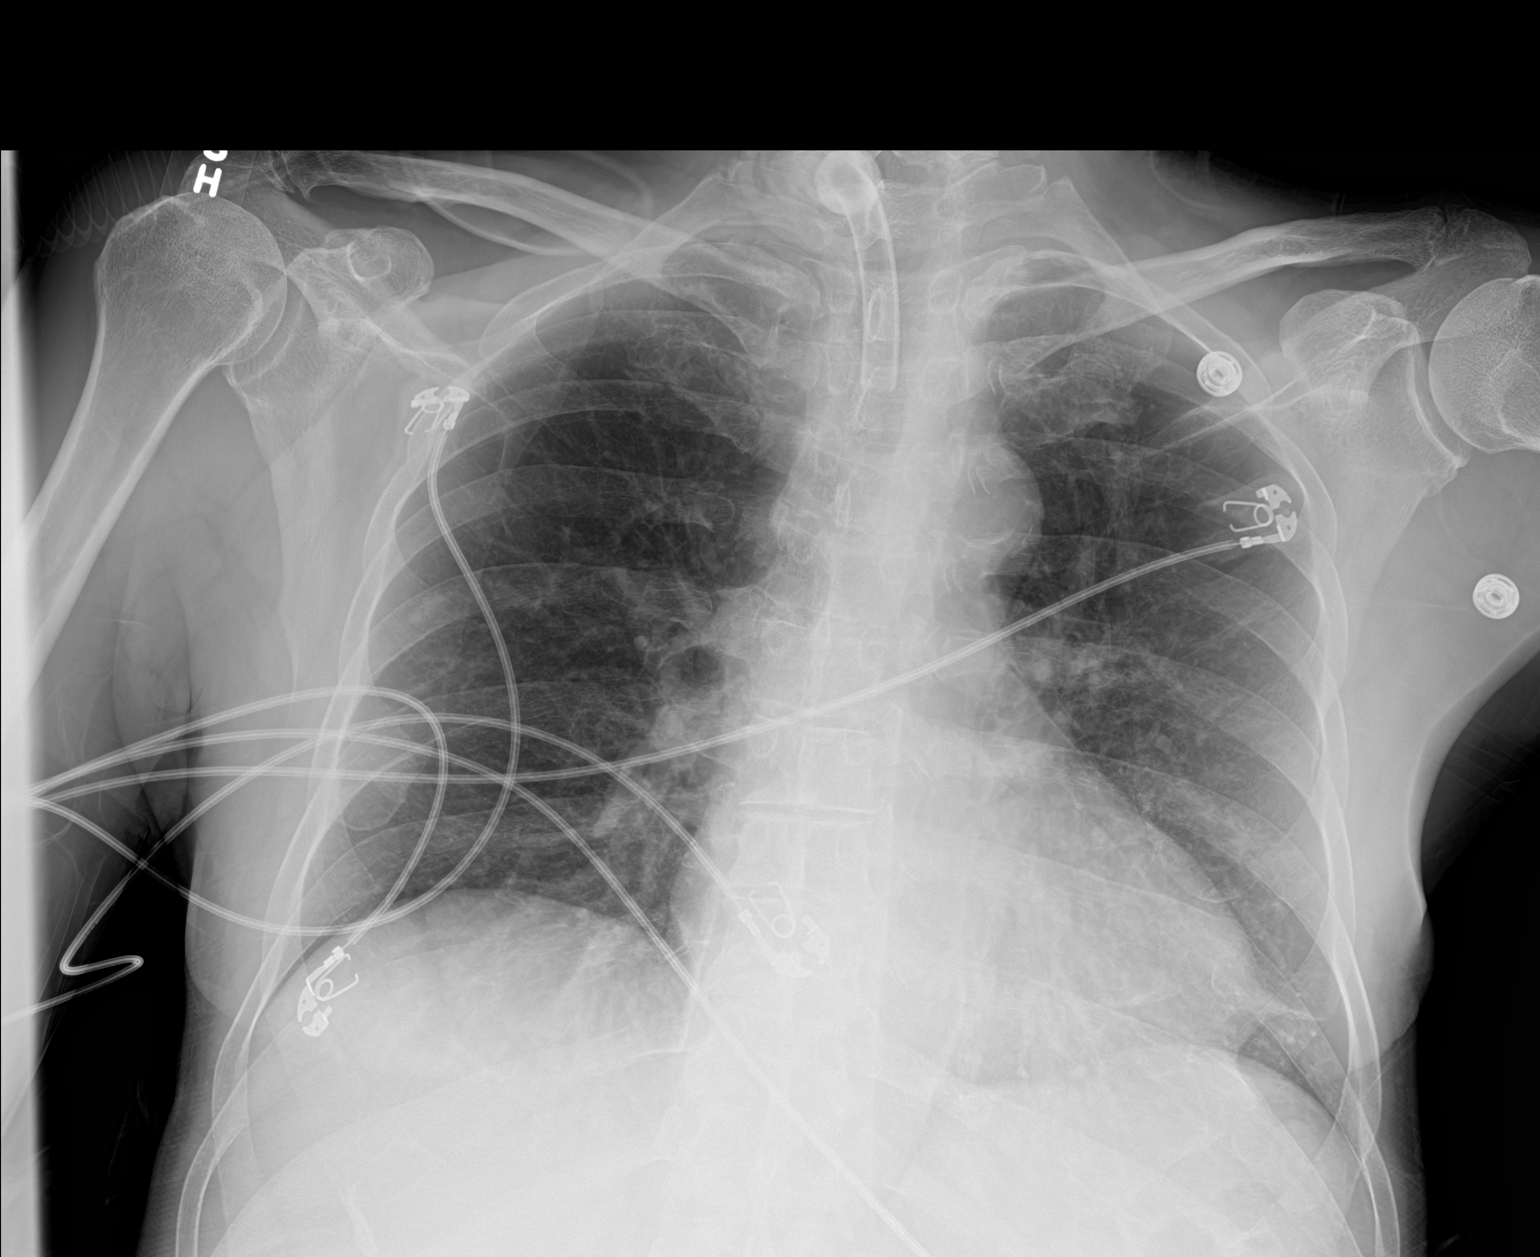

[1 of 1 positions shown; findings below may reference images not displayed]

FINDINGS: Tracheostomy tube is in good position. Lungs are emphysematous but
clear. Heart size is normal. No pneumothorax or pleural fluid.
Aortic atherosclerosis noted. No acute or focal bony abnormality.
IMPRESSION: No acute disease.

Atherosclerosis.

Emphysema.
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
## 2021-04-21 ENCOUNTER — Emergency Department: Payer: Medicaid Other

## 2021-04-21 ENCOUNTER — Emergency Department
Admission: EM | Admit: 2021-04-21 | Discharge: 2021-04-21 | Disposition: A | Discharge status: Home / Self Care | Payer: Medicaid Other | Attending: Emergency Medicine | Admitting: Emergency Medicine

## 2021-04-21 ENCOUNTER — Other Ambulatory Visit: Payer: Self-pay

## 2021-04-21 ENCOUNTER — Encounter: Payer: Self-pay | Admitting: Emergency Medicine

## 2021-04-21 DIAGNOSIS — Z79899 Other long term (current) drug therapy: Secondary | ICD-10-CM | POA: Insufficient documentation

## 2021-04-21 DIAGNOSIS — M542 Cervicalgia: Secondary | ICD-10-CM | POA: Diagnosis not present

## 2021-04-21 DIAGNOSIS — I10 Essential (primary) hypertension: Secondary | ICD-10-CM | POA: Insufficient documentation

## 2021-04-21 DIAGNOSIS — F17210 Nicotine dependence, cigarettes, uncomplicated: Secondary | ICD-10-CM | POA: Diagnosis not present

## 2021-04-21 DIAGNOSIS — R519 Headache, unspecified: Secondary | ICD-10-CM

## 2021-04-21 DIAGNOSIS — U071 COVID-19: Secondary | ICD-10-CM | POA: Insufficient documentation

## 2021-04-21 LAB — RESP PANEL BY RT-PCR (FLU A&B, COVID) ARPGX2
Influenza A by PCR: NEGATIVE
Influenza B by PCR: NEGATIVE
SARS Coronavirus 2 by RT PCR: POSITIVE — AB

## 2021-04-21 LAB — BASIC METABOLIC PANEL
Anion gap: 9 (ref 5–15)
BUN: 5 mg/dL — ABNORMAL LOW (ref 6–20)
CO2: 26 mmol/L (ref 22–32)
Calcium: 9.2 mg/dL (ref 8.9–10.3)
Chloride: 97 mmol/L — ABNORMAL LOW (ref 98–111)
Creatinine, Ser: 0.62 mg/dL (ref 0.61–1.24)
GFR, Estimated: >60 mL/min (ref >60)
Glucose, Bld: 152 mg/dL — ABNORMAL HIGH (ref 70–99)
Potassium: 3.7 mmol/L (ref 3.5–5.1)
Sodium: 132 mmol/L — ABNORMAL LOW (ref 135–145)

## 2021-04-21 LAB — CBC
HCT: 41.6 % (ref 39.0–52.0)
Hemoglobin: 14.6 g/dL (ref 13.0–17.0)
MCH: 32.7 pg (ref 26.0–34.0)
MCHC: 35.1 g/dL (ref 30.0–36.0)
MCV: 93.3 fL (ref 80.0–100.0)
Platelets: 146 10*3/uL — ABNORMAL LOW (ref 150–400)
RBC: 4.46 MIL/uL (ref 4.22–5.81)
RDW: 11.9 % (ref 11.5–15.5)
WBC: 9.2 10*3/uL (ref 4.0–10.5)
nRBC: 0 % (ref 0.0–0.2)

## 2021-04-21 LAB — TROPONIN I (HIGH SENSITIVITY)
Troponin I (High Sensitivity): 4 ng/L (ref <18)
Troponin I (High Sensitivity): 4 ng/L (ref <18)

## 2021-04-21 MED ORDER — AMLODIPINE BESYLATE 5 MG PO TABS
5.0000 mg | ORAL_TABLET | Freq: Once | ORAL | Status: AC
  Administered 2021-04-21: 5 mg via ORAL
  Filled 2021-04-21: qty 1

## 2021-04-21 MED ORDER — CYCLOBENZAPRINE HCL 10 MG PO TABS
5.0000 mg | ORAL_TABLET | Freq: Once | ORAL | Status: AC
  Administered 2021-04-21: 5 mg via ORAL
  Filled 2021-04-21: qty 1

## 2021-04-21 MED ORDER — IBUPROFEN 600 MG PO TABS
600.0000 mg | ORAL_TABLET | Freq: Once | ORAL | Status: AC
  Administered 2021-04-21: 600 mg via ORAL
  Filled 2021-04-21: qty 1

## 2021-04-21 MED ORDER — PAXLOVID 10 X 150 MG & 10 X 100MG PO TBPK: 2.0000 | ORAL_TABLET | Freq: Two times a day (BID) | ORAL | 0 refills | Status: AC

## 2021-04-21 NOTE — ED Triage Notes (Signed)
Pt to ER with c/o bilateral neck pain and chest pain. Pt states both for last 5 days.  States neck hurst worse with movement.  Pt denies n/v/diaphoresis or other s/s at this time.  Pt denies known injury.

## 2021-04-21 NOTE — ED Provider Notes (Signed)
[facility] Emergency Department Provider Note   ____________________________________________   Event Date/Time   First MD Initiated Contact with Patient 04/21/21 1952     (approximate)  I have reviewed the triage vital signs and the nursing notes.   HISTORY  Chief Complaint Chest Pain and Neck Pain    HPI Jose [name] is a 60 y.o. male who presents for headache and neck pain  LOCATION: Head and neck DURATION: 1 week TIMING: Worsening since onset SEVERITY: Severe QUALITY: Aching CONTEXT: States he woke up with severe bilateral neck pain 1 week prior to arrival now intermittently causes some headaches MODIFYING FACTORS: Any movement of the head worsens this neck pain and he denies any relieving factors ASSOCIATED SYMPTOMS: Headache, generalized fatigue, chills   Per medical record review, patient does have history of chronic hep C and polysubstance abuse          Past Medical History:  Diagnosis Date   Hep C w/o coma, chronic ([place])    Hypertension    Polysubstance abuse ([place])     Patient Active Problem List   Diagnosis Date Noted   Tracheostomy status ([place])    SDH (subdural hematoma) ([place]) 01/23/2019   Sedative, hypnotic or anxiolytic use disorder, severe, 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/14/2015   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   Substance induced mood disorder ([place]) 11/03/2015   Alcohol use disorder, severe, dependence ([place]) 10/24/2015   Alcohol withdrawal ([place]) 10/24/2015   Stimulant use disorder ([place]) (cocaine) 10/24/2015   Tobacco use disorder 10/24/2015   Hepatitis C 09/06/2015    Past Surgical History:  Procedure Laterality Date   ESOPHAGOGASTRODUODENOSCOPY (EGD) WITH PROPOFOL N/A 03/02/2019   Procedure: ESOPHAGOGASTRODUODENOSCOPY (EGD) WITH PROPOFOL;  Surgeon: Georganna Skeans, MD;  Location: [place];  Service: General;  Laterality: N/A;   IR GASTROSTOMY TUBE REMOVAL   05/04/2019   PEG PLACEMENT N/A 03/02/2019   Procedure: PERCUTANEOUS ENDOSCOPIC GASTROSTOMY (PEG) PLACEMENT;  Surgeon: Georganna Skeans, MD;  Location: [place];  Service: General;  Laterality: N/A;   PERCUTANEOUS TRACHEOSTOMY N/A 02/12/2019   Procedure: PERCUTANEOUS TRACHEOSTOMY;  Surgeon: Georganna Skeans, MD;  Location: [place];  Service: General;  Laterality: N/A;    Prior to Admission medications   Medication Sig Start Date End Date Taking? Authorizing Provider  nirmatrelvir/ritonavir EUA, renal dosing, (PAXLOVID) TBPK Take 2 tablets by mouth 2 (two) times daily for 5 days. Patient GFR is >60. Take nirmatrelvir (150 mg) one tablet twice daily for 5 days and ritonavir (100 mg) one tablet twice daily for 5 days. 04/21/21 04/26/21 Yes Naaman Plummer, MD  acetaminophen (TYLENOL) 160 MG/5ML solution Take 20.3 mLs (650 mg total) by mouth every 6 (six) hours as needed for fever. 03/18/19   Meuth, Brooke A, PA-C  Amino Acids-Protein Hydrolys (FEEDING SUPPLEMENT, PRO-STAT SUGAR FREE 64,) LIQD Place 30 mLs into feeding tube 2 (two) times daily. 03/18/19   Meuth, Brooke A, PA-C  amLODipine (NORVASC) 5 MG tablet Take 1 tablet (5 mg total) by mouth daily. 03/19/19   Meuth, Brooke A, PA-C  cephALEXin (KEFLEX) 500 MG capsule Take 1 capsule (500 mg total) by mouth 3 (three) times daily. 09/19/18   Paulette Blanch, MD  chlorhexidine gluconate, MEDLINE KIT, (PERIDEX) 0.12 % solution 15 mLs by Mouth Rinse route 2 (two) times daily. 03/18/19   Meuth, Brooke A, PA-C  clonazePAM (KLONOPIN) 1 MG disintegrating tablet Take 1 tablet (1 mg total) by mouth 2 (two) times daily as needed  for seizure (and/or agitation). 03/18/19   Meuth, Blaine Hamper, PA-C  docusate (COLACE) 50 MG/5ML liquid Take 5 mLs (50 mg total) by mouth daily. 03/19/19   Meuth, Blaine Hamper, PA-C  folic acid (FOLVITE) 1 MG tablet Take 1 tablet (1 mg total) by mouth daily. 03/19/19   Meuth, Brooke A, PA-C  guaiFENesin (ROBITUSSIN) 100 MG/5ML SOLN Place 15 mLs (300 mg total)  into feeding tube every 6 (six) hours. 03/18/19   Meuth, Blaine Hamper, PA-C  hydrochlorothiazide (MICROZIDE) 12.5 MG capsule Take 2 capsules (25 mg total) by mouth daily. 03/19/19   Meuth, Brooke A, PA-C  metoprolol tartrate (LOPRESSOR) 25 mg/10 mL SUSP Take 10 mLs (25 mg total) by mouth 2 (two) times daily. 03/18/19   Meuth, Brooke A, PA-C  Multiple Vitamin (MULTIVITAMIN WITH MINERALS) TABS tablet Take 1 tablet by mouth daily. 03/19/19   Meuth, Brooke A, PA-C  Nutritional Supplements (FEEDING SUPPLEMENT, OSMOLITE 1.5 CAL,) LIQD Place 474 mLs into feeding tube 3 (three) times daily. 03/18/19   Meuth, Brooke A, PA-C  ondansetron (ZOFRAN ODT) 4 MG disintegrating tablet Take 1 tablet (4 mg total) by mouth every 8 (eight) hours as needed for nausea or vomiting. 09/19/18   Paulette Blanch, MD  ondansetron (ZOFRAN) 4 MG tablet Take 1 tablet (4 mg total) by mouth every 8 (eight) hours as needed for nausea or vomiting. 03/18/19   Meuth, Jerene Pitch A, PA-C  oxyCODONE (OXY IR/ROXICODONE) 5 MG immediate release tablet Take 1 tablet (5 mg total) by mouth every 6 (six) hours as needed for severe pain. 03/18/19   Meuth, Blaine Hamper, PA-C  oxyCODONE-acetaminophen (PERCOCET/ROXICET) 5-325 MG tablet Take 1 tablet by mouth every 4 (four) hours as needed for severe pain. 09/19/18   Paulette Blanch, MD  pantoprazole (PROTONIX) 20 MG tablet Take 1 tablet (20 mg total) by mouth daily. 03/18/19   Meuth, Brooke A, PA-C  polyethylene glycol (MIRALAX / GLYCOLAX) 17 g packet Take 17 g by mouth daily. 03/19/19   Meuth, Brooke A, PA-C  QUEtiapine (SEROQUEL) 100 MG tablet Take 1 tablet (100 mg total) by mouth at bedtime. 03/18/19   Meuth, Brooke A, PA-C  thiamine 100 MG tablet Take 1 tablet (100 mg total) by mouth daily. 03/19/19   Meuth, Blaine Hamper, PA-C    Allergies Patient has no known allergies.  History reviewed. No pertinent family history.  Social History Social History   Tobacco Use   Smoking status: Every Day    Packs/day: 0.50    Years:  45.00    Pack years: 22.50    Types: Cigarettes   Smokeless tobacco: Never  Vaping Use   Vaping Use: Never used  Substance Use Topics   Alcohol use: Yes    Comment: 12 pack per day   Drug use: Yes    Types: Cocaine    Comment: today    Review of Systems Constitutional: No fever/endorses chills Eyes: No visual changes. ENT: No sore throat. Cardiovascular: Denies chest pain. Respiratory: Denies shortness of breath. Gastrointestinal: No abdominal pain.  No nausea, no vomiting.  No diarrhea. Genitourinary: Negative for dysuria. Musculoskeletal: Positive for acute neck pain Skin: Negative for rash. Neurological: Positive for headaches, negative for weakness/numbness/paresthesias in any extremity Psychiatric: Negative for suicidal ideation/homicidal ideation   ____________________________________________   PHYSICAL EXAM:  VITAL SIGNS: ED Triage Vitals  Enc Vitals Group     BP 04/21/21 1521 (!) 149/95     Pulse Rate 04/21/21 1521 (!) 113     Resp  04/21/21 1521 18     Temp 04/21/21 1521 100.1 F (37.8 C)     Temp Source 04/21/21 1521 Oral     SpO2 04/21/21 1521 96 %     Weight 04/21/21 1522 150 lb (68 kg)     Height 04/21/21 1522 5' 8"  (1.727 m)     Head Circumference --      Peak Flow --      Pain Score 04/21/21 1521 10     Pain Loc --      Pain Edu? --      Excl. in [place]? --    Constitutional: Alert and oriented. Well appearing and in no acute distress. Eyes: Conjunctivae are injected. PERRL. Head: Atraumatic. Nose: No congestion/rhinnorhea. Mouth/Throat: Mucous membranes are moist. Neck: No stridor.  Supple.  No meningismus Cardiovascular: Grossly normal heart sounds.  Good peripheral circulation. Respiratory: Normal respiratory effort.  No retractions. Gastrointestinal: Soft and nontender. No distention. Musculoskeletal: No obvious deformities Neurologic:  Normal speech and language. No gross focal neurologic deficits are appreciated. Skin:  Skin is warm and  dry. No rash noted. Psychiatric: Mood and affect are normal. Speech and behavior are normal.  ____________________________________________   LABS (all labs ordered are listed, but only abnormal results are displayed)  Labs Reviewed  RESP PANEL BY RT-PCR (FLU A&B, COVID) ARPGX2 - Abnormal; Notable for the following components:      Result Value   SARS Coronavirus 2 by RT PCR POSITIVE (*)    All other components within normal limits  BASIC METABOLIC PANEL - Abnormal; Notable for the following components:   Sodium 132 (*)    Chloride 97 (*)    Glucose, Bld 152 (*)    BUN 5 (*)    All other components within normal limits  CBC - Abnormal; Notable for the following components:   Platelets 146 (*)    All other components within normal limits  TROPONIN I (HIGH SENSITIVITY)  TROPONIN I (HIGH SENSITIVITY)   ____________________________________________  EKG  ED ECG REPORT I, Naaman Plummer, the attending physician, personally viewed and interpreted this ECG.  Date: 04/21/2021 EKG Time: 1515 Rate: 112 Rhythm: Tachycardic sinus rhythm QRS Axis: normal Intervals: Right bundle branch block ST/T Wave abnormalities: normal Narrative Interpretation: Right bundle branch block, no evidence of acute ischemia  ____________________________________________  RADIOLOGY  ED MD interpretation: 2 view chest x-ray shows no evidence of acute abnormalities including no pneumonia, pneumothorax, or widened mediastinum CT of the head without contrast shows no evidence of acute abnormalities including no intracerebral hemorrhage, obvious masses, or significant edema  Official radiology report(s): DG Chest 2 View  Result Date: 04/21/2021 CLINICAL DATA:  Chest and neck pain for 5 days. EXAM: CHEST - 2 VIEW COMPARISON:  03/06/2019 FINDINGS: Emphysema. Atherosclerotic calcification of the aortic arch. Left posterolateral eighth and ninth rib deformities likely from old fractures. Heart size within normal  limits. The lungs appear otherwise clear. No blunting of the costophrenic angles. Mild thoracic spondylosis. IMPRESSION: 1. No acute findings. 2. Aortic Atherosclerosis (ICD10-I70.0) and Emphysema (ICD10-J43.9). Electronically Signed   By: Van Clines M.D.   On: 04/21/2021 16:18   CT HEAD WO CONTRAST  Result Date: 04/21/2021 CLINICAL DATA:  Neck pain, acute, no red flags EXAM: CT HEAD WITHOUT CONTRAST TECHNIQUE: Contiguous axial images were obtained from the base of the skull through the vertex without intravenous contrast. COMPARISON:  Head CT 11/18/2019 FINDINGS: Brain: Mild generalized atrophy is stable from prior exam. There is periventricular chronic small vessel  ischemia. No intracranial hemorrhage, mass effect, or midline shift. No hydrocephalus. The basilar cisterns are patent. No evidence of territorial infarct or acute ischemia. No extra-axial or intracranial fluid collection. Vascular: No hyperdense vessel.  Skull base atherosclerosis. Skull: Normal. Negative for fracture or focal lesion. Sinuses/Orbits: Paranasal sinus mucosal thickening, progressed from prior exam. There is subtotal opacification of the right and left sphenoid sinus with mucosal thickening. Ethmoid air cells in the left maxillary sinus. No mastoid effusion. No acute orbital findings. Other: None. IMPRESSION: 1. No acute intracranial abnormality. 2. Unchanged atrophy and chronic small vessel ischemia. Skull base atherosclerosis, advanced for age. 3. Progressive paranasal sinus disease since prior exam. Electronically Signed   By: Keith Rake M.D.   On: 04/21/2021 20:44   CT Cervical Spine Wo Contrast  Result Date: 04/21/2021 CLINICAL DATA:  Patient reports cervical neck pain for 5 days. EXAM: CT CERVICAL SPINE WITHOUT CONTRAST TECHNIQUE: Multidetector CT imaging of the cervical spine was performed without intravenous contrast. Multiplanar CT image reconstructions were also generated. COMPARISON:  Cervical spine CT  11/18/2019 FINDINGS: Alignment: Stable from prior exam. Trace retrolisthesis of C5 on C6. Skull base and vertebrae: No acute fracture. No primary bone lesion or focal pathologic process. Soft tissues and spinal canal: No prevertebral fluid or swelling. No visible canal hematoma. Disc levels: Multilevel disc space narrowing and endplate spurring, most prominently affecting C4-C5 and C5-C6. Multilevel facet hypertrophy. There is no high-grade canal stenosis. Upper chest: Emphysema.  No acute findings. Other: None. IMPRESSION: 1. Multilevel degenerative disc disease and facet hypertrophy throughout the cervical spine. Similar degenerative change to 11/18/2019 CT. 2. No acute findings. Emphysema (ICD10-J43.9). Electronically Signed   By: Keith Rake M.D.   On: 04/21/2021 20:40    ____________________________________________   PROCEDURES  Procedure(s) performed (including Critical Care):  .1-3 Lead EKG Interpretation  Date/Time: 04/21/2021 9:34 PM Performed by: Naaman Plummer, MD Authorized by: Naaman Plummer, MD     Interpretation: normal     ECG rate:  85   ECG rate assessment: normal     Rhythm: sinus rhythm     Ectopy: none     Conduction: normal     ____________________________________________   INITIAL IMPRESSION / ASSESSMENT AND PLAN / ED COURSE  As part of my medical decision making, I reviewed the following data within the electronic medical record, if available:  Nursing notes reviewed and incorporated, Labs reviewed, EKG interpreted, Old chart reviewed, Radiograph reviewed and Notes from prior ED visits reviewed and incorporated        Presentation most consistent with Viral Syndrome.  Patient has tested positive for COVID-19. Based on vitals and exam they are nontoxic and stable for discharge.  Given History and Exam I have a lower suspicion for: Emergent CardioPulmonary causes [such as Acute Asthma or COPD Exacerbation, acute Heart Failure or exacerbation, PE, PTX,  atypical ACS, PNA]. Emergent Otolaryngeal causes [such as PTA, RPA, [place]igs, Epiglottitis, EBV].  Regarding Emergent Travel or Immunosuppressive related infectious: I have a low suspicion for acute HIV.  Will provide strict return precautions and instructions on self-isolation/quarantine and anticipatory guidance.      ____________________________________________   FINAL CLINICAL IMPRESSION(S) / ED DIAGNOSES  Final diagnoses:  COVID-19 virus infection  Neck pain  Nonintractable episodic headache, unspecified headache type     ED Discharge Orders          Ordered    nirmatrelvir/ritonavir EUA, renal dosing, (PAXLOVID) TBPK  2 times daily  04/21/21 2134             Note:  This document was prepared using Dragon voice recognition software and may include unintentional dictation errors.    Naaman Plummer, MD 04/21/21 [phone]

## 2021-04-21 NOTE — ED Notes (Signed)
[name], MD notified about pt high BP. Norvasc ordered.

## 2021-04-30 ENCOUNTER — Emergency Department: Payer: Medicaid Other

## 2021-04-30 ENCOUNTER — Emergency Department
Admission: EM | Admit: 2021-04-30 | Discharge: 2021-05-01 | Disposition: A | Discharge status: Home / Self Care | Payer: Medicaid Other | Attending: Emergency Medicine | Admitting: Emergency Medicine

## 2021-04-30 ENCOUNTER — Other Ambulatory Visit: Payer: Self-pay

## 2021-04-30 DIAGNOSIS — F17210 Nicotine dependence, cigarettes, uncomplicated: Secondary | ICD-10-CM | POA: Diagnosis not present

## 2021-04-30 DIAGNOSIS — Z79899 Other long term (current) drug therapy: Secondary | ICD-10-CM | POA: Insufficient documentation

## 2021-04-30 DIAGNOSIS — F10929 Alcohol use, unspecified with intoxication, unspecified: Secondary | ICD-10-CM | POA: Diagnosis not present

## 2021-04-30 DIAGNOSIS — Y907 Blood alcohol level of 200-239 mg/100 ml: Secondary | ICD-10-CM | POA: Diagnosis not present

## 2021-04-30 DIAGNOSIS — S0083XA Contusion of other part of head, initial encounter: Secondary | ICD-10-CM

## 2021-04-30 DIAGNOSIS — I10 Essential (primary) hypertension: Secondary | ICD-10-CM | POA: Diagnosis not present

## 2021-04-30 DIAGNOSIS — Z23 Encounter for immunization: Secondary | ICD-10-CM | POA: Insufficient documentation

## 2021-04-30 DIAGNOSIS — S61401A Unspecified open wound of right hand, initial encounter: Secondary | ICD-10-CM | POA: Diagnosis not present

## 2021-04-30 DIAGNOSIS — S60221A Contusion of right hand, initial encounter: Secondary | ICD-10-CM

## 2021-04-30 DIAGNOSIS — S01511A Laceration without foreign body of lip, initial encounter: Secondary | ICD-10-CM | POA: Diagnosis not present

## 2021-04-30 DIAGNOSIS — Y939 Activity, unspecified: Secondary | ICD-10-CM | POA: Insufficient documentation

## 2021-04-30 DIAGNOSIS — Y999 Unspecified external cause status: Secondary | ICD-10-CM | POA: Diagnosis not present

## 2021-04-30 DIAGNOSIS — M791 Myalgia, unspecified site: Secondary | ICD-10-CM | POA: Insufficient documentation

## 2021-04-30 DIAGNOSIS — Y929 Unspecified place or not applicable: Secondary | ICD-10-CM | POA: Insufficient documentation

## 2021-04-30 DIAGNOSIS — S0181XA Laceration without foreign body of other part of head, initial encounter: Secondary | ICD-10-CM | POA: Diagnosis not present

## 2021-04-30 DIAGNOSIS — S61411A Laceration without foreign body of right hand, initial encounter: Secondary | ICD-10-CM

## 2021-04-30 MED ORDER — TETANUS-DIPHTH-ACELL PERTUSSIS 5-2.5-18.5 LF-MCG/0.5 IM SUSY
0.5000 mL | PREFILLED_SYRINGE | Freq: Once | INTRAMUSCULAR | Status: AC
  Administered 2021-04-30: 0.5 mL via INTRAMUSCULAR
  Filled 2021-04-30: qty 0.5

## 2021-04-30 MED ORDER — ONDANSETRON HCL 4 MG/2ML IJ SOLN
4.0000 mg | INTRAMUSCULAR | Status: AC
  Administered 2021-04-30: 4 mg via INTRAVENOUS
  Filled 2021-04-30: qty 2

## 2021-04-30 MED ORDER — CEFAZOLIN SODIUM-DEXTROSE 1-4 GM/50ML-% IV SOLN
1.0000 g | Freq: Once | INTRAVENOUS | Status: AC
  Administered 2021-04-30: 1 g via INTRAVENOUS
  Filled 2021-04-30: qty 50

## 2021-04-30 MED ORDER — MORPHINE SULFATE (PF) 4 MG/ML IV SOLN
4.0000 mg | Freq: Once | INTRAVENOUS | Status: AC
  Administered 2021-04-30: 4 mg via INTRAVENOUS
  Filled 2021-04-30: qty 1
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
## 2021-05-01 ENCOUNTER — Emergency Department: Payer: Medicaid Other

## 2021-05-01 ENCOUNTER — Other Ambulatory Visit: Payer: Self-pay

## 2021-05-01 ENCOUNTER — Emergency Department
Admission: EM | Admit: 2021-05-01 | Discharge: 2021-05-01 | Disposition: A | Discharge status: Home / Self Care | Payer: Medicaid Other | Source: Home / Self Care | Attending: Emergency Medicine | Admitting: Emergency Medicine

## 2021-05-01 DIAGNOSIS — Y907 Blood alcohol level of 200-239 mg/100 ml: Secondary | ICD-10-CM | POA: Insufficient documentation

## 2021-05-01 DIAGNOSIS — Z79899 Other long term (current) drug therapy: Secondary | ICD-10-CM | POA: Insufficient documentation

## 2021-05-01 DIAGNOSIS — I10 Essential (primary) hypertension: Secondary | ICD-10-CM | POA: Insufficient documentation

## 2021-05-01 DIAGNOSIS — M791 Myalgia, unspecified site: Secondary | ICD-10-CM | POA: Insufficient documentation

## 2021-05-01 DIAGNOSIS — F17210 Nicotine dependence, cigarettes, uncomplicated: Secondary | ICD-10-CM | POA: Insufficient documentation

## 2021-05-01 DIAGNOSIS — M7918 Myalgia, other site: Secondary | ICD-10-CM

## 2021-05-01 LAB — URINE DRUG SCREEN, QUALITATIVE (ARMC ONLY)
Amphetamines, Ur Screen: NOT DETECTED
Barbiturates, Ur Screen: NOT DETECTED
Benzodiazepine, Ur Scrn: NOT DETECTED
Cannabinoid 50 Ng, Ur ~~LOC~~: NOT DETECTED
Cocaine Metabolite,Ur ~~LOC~~: POSITIVE — AB
MDMA (Ecstasy)Ur Screen: NOT DETECTED
Methadone Scn, Ur: NOT DETECTED
Opiate, Ur Screen: NOT DETECTED
Phencyclidine (PCP) Ur S: NOT DETECTED
Tricyclic, Ur Screen: NOT DETECTED

## 2021-05-01 LAB — CBC WITH DIFFERENTIAL/PLATELET
Abs Immature Granulocytes: 0.06 10*3/uL (ref 0.00–0.07)
Basophils Absolute: 0 10*3/uL (ref 0.0–0.1)
Basophils Relative: 1 %
Eosinophils Absolute: 0.1 10*3/uL (ref 0.0–0.5)
Eosinophils Relative: 1 %
HCT: 40.3 % (ref 39.0–52.0)
Hemoglobin: 13.7 g/dL (ref 13.0–17.0)
Immature Granulocytes: 1 %
Lymphocytes Relative: 27 %
Lymphs Abs: 1.7 10*3/uL (ref 0.7–4.0)
MCH: 33 pg (ref 26.0–34.0)
MCHC: 34 g/dL (ref 30.0–36.0)
MCV: 97.1 fL (ref 80.0–100.0)
Monocytes Absolute: 0.6 10*3/uL (ref 0.1–1.0)
Monocytes Relative: 10 %
Neutro Abs: 3.8 10*3/uL (ref 1.7–7.7)
Neutrophils Relative %: 60 %
Platelets: 182 10*3/uL (ref 150–400)
RBC: 4.15 MIL/uL — ABNORMAL LOW (ref 4.22–5.81)
RDW: 12.2 % (ref 11.5–15.5)
WBC: 6.3 10*3/uL (ref 4.0–10.5)
nRBC: 0 % (ref 0.0–0.2)

## 2021-05-01 LAB — ACETAMINOPHEN LEVEL: Acetaminophen (Tylenol), Serum: <10 ug/mL — ABNORMAL LOW (ref 10–30)

## 2021-05-01 LAB — COMPREHENSIVE METABOLIC PANEL
ALT: 49 U/L — ABNORMAL HIGH (ref 0–44)
AST: 70 U/L — ABNORMAL HIGH (ref 15–41)
Albumin: 4 g/dL (ref 3.5–5.0)
Alkaline Phosphatase: 94 U/L (ref 38–126)
Anion gap: 8 (ref 5–15)
BUN: 6 mg/dL (ref 6–20)
CO2: 28 mmol/L (ref 22–32)
Calcium: 9.3 mg/dL (ref 8.9–10.3)
Chloride: 99 mmol/L (ref 98–111)
Creatinine, Ser: 0.72 mg/dL (ref 0.61–1.24)
GFR, Estimated: >60 mL/min (ref >60)
Glucose, Bld: 102 mg/dL — ABNORMAL HIGH (ref 70–99)
Potassium: 4.2 mmol/L (ref 3.5–5.1)
Sodium: 135 mmol/L (ref 135–145)
Total Bilirubin: 0.6 mg/dL (ref 0.3–1.2)
Total Protein: 8.3 g/dL — ABNORMAL HIGH (ref 6.5–8.1)

## 2021-05-01 LAB — CBC
HCT: 39.9 % (ref 39.0–52.0)
Hemoglobin: 13.7 g/dL (ref 13.0–17.0)
MCH: 33.6 pg (ref 26.0–34.0)
MCHC: 34.3 g/dL (ref 30.0–36.0)
MCV: 97.8 fL (ref 80.0–100.0)
Platelets: 169 10*3/uL (ref 150–400)
RBC: 4.08 MIL/uL — ABNORMAL LOW (ref 4.22–5.81)
RDW: 12.3 % (ref 11.5–15.5)
WBC: 8.2 10*3/uL (ref 4.0–10.5)
nRBC: 0 % (ref 0.0–0.2)

## 2021-05-01 LAB — BASIC METABOLIC PANEL
Anion gap: 13 (ref 5–15)
BUN: 10 mg/dL (ref 6–20)
CO2: 24 mmol/L (ref 22–32)
Calcium: 9.6 mg/dL (ref 8.9–10.3)
Chloride: 104 mmol/L (ref 98–111)
Creatinine, Ser: 0.88 mg/dL (ref 0.61–1.24)
GFR, Estimated: >60 mL/min (ref >60)
Glucose, Bld: 103 mg/dL — ABNORMAL HIGH (ref 70–99)
Potassium: 4.3 mmol/L (ref 3.5–5.1)
Sodium: 141 mmol/L (ref 135–145)

## 2021-05-01 LAB — ETHANOL
Alcohol, Ethyl (B): 221 mg/dL — ABNORMAL HIGH (ref <10)
Alcohol, Ethyl (B): 395 mg/dL (ref <10)

## 2021-05-01 LAB — SALICYLATE LEVEL: Salicylate Lvl: <7 mg/dL — ABNORMAL LOW (ref 7.0–30.0)

## 2021-05-01 MED ORDER — DROPERIDOL 2.5 MG/ML IJ SOLN
2.5000 mg | Freq: Once | INTRAMUSCULAR | Status: AC
  Administered 2021-05-01: 2.5 mg via INTRAVENOUS
  Filled 2021-05-01: qty 2

## 2021-05-01 MED ORDER — AMOXICILLIN-POT CLAVULANATE 875-125 MG PO TABS: 1.0000 | ORAL_TABLET | Freq: Two times a day (BID) | ORAL | 0 refills | Status: AC

## 2021-05-01 MED ORDER — KETOROLAC TROMETHAMINE 60 MG/2ML IM SOLN
15.0000 mg | Freq: Once | INTRAMUSCULAR | Status: AC
  Administered 2021-05-01: 15 mg via INTRAMUSCULAR
  Filled 2021-05-01: qty 2

## 2021-05-01 MED ORDER — NAPROXEN 250 MG PO TABS: 500.0000 mg | ORAL_TABLET | Freq: Two times a day (BID) | ORAL | 0 refills | Status: AC
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
## 2021-05-01 NOTE — ED Notes (Signed)
Pt face cleaned and wiped free of blood; pt mid eye laceration covered with a 4x4 gauze and tape.

## 2021-05-01 NOTE — Discharge Instructions (Signed)
Please keep your wounds clean and dry.  Stick to a soft or liquid diet for least a few days to allow your lip to heal.  Please change the dressing on your hand wounds and take the prescribed antibiotics as written.  You can use your hand as tolerated by the pain; there are no broken bones but the contusions will take a while to heal.  Follow-up with your regular doctor or contact [facility] to establish a primary care provider.

## 2021-05-14 ENCOUNTER — Emergency Department: Payer: Medicaid Other

## 2021-05-14 ENCOUNTER — Other Ambulatory Visit: Payer: Self-pay

## 2021-05-14 ENCOUNTER — Emergency Department
Admission: EM | Admit: 2021-05-14 | Discharge: 2021-05-14 | Disposition: A | Discharge status: Home / Self Care | Payer: Medicaid Other | Attending: Emergency Medicine | Admitting: Emergency Medicine

## 2021-05-14 DIAGNOSIS — I10 Essential (primary) hypertension: Secondary | ICD-10-CM | POA: Insufficient documentation

## 2021-05-14 DIAGNOSIS — S0990XA Unspecified injury of head, initial encounter: Secondary | ICD-10-CM | POA: Diagnosis not present

## 2021-05-14 DIAGNOSIS — M546 Pain in thoracic spine: Secondary | ICD-10-CM | POA: Diagnosis not present

## 2021-05-14 DIAGNOSIS — F17210 Nicotine dependence, cigarettes, uncomplicated: Secondary | ICD-10-CM | POA: Insufficient documentation

## 2021-05-14 DIAGNOSIS — Z79899 Other long term (current) drug therapy: Secondary | ICD-10-CM | POA: Diagnosis not present

## 2021-05-14 DIAGNOSIS — S8001XA Contusion of right knee, initial encounter: Secondary | ICD-10-CM | POA: Diagnosis not present

## 2021-05-14 DIAGNOSIS — S299XXA Unspecified injury of thorax, initial encounter: Secondary | ICD-10-CM | POA: Diagnosis present

## 2021-05-14 DIAGNOSIS — S2232XA Fracture of one rib, left side, initial encounter for closed fracture: Secondary | ICD-10-CM | POA: Diagnosis not present

## 2021-05-14 LAB — CBC
HCT: 38.8 % — ABNORMAL LOW (ref 39.0–52.0)
Hemoglobin: 13.5 g/dL (ref 13.0–17.0)
MCH: 33.5 pg (ref 26.0–34.0)
MCHC: 34.8 g/dL (ref 30.0–36.0)
MCV: 96.3 fL (ref 80.0–100.0)
Platelets: 105 10*3/uL — ABNORMAL LOW (ref 150–400)
RBC: 4.03 MIL/uL — ABNORMAL LOW (ref 4.22–5.81)
RDW: 12.5 % (ref 11.5–15.5)
WBC: 7.5 10*3/uL (ref 4.0–10.5)
nRBC: 0 % (ref 0.0–0.2)

## 2021-05-14 LAB — BASIC METABOLIC PANEL
Anion gap: 13 (ref 5–15)
BUN: 7 mg/dL (ref 6–20)
CO2: 26 mmol/L (ref 22–32)
Calcium: 9.2 mg/dL (ref 8.9–10.3)
Chloride: 94 mmol/L — ABNORMAL LOW (ref 98–111)
Creatinine, Ser: 0.66 mg/dL (ref 0.61–1.24)
GFR, Estimated: >60 mL/min (ref >60)
Glucose, Bld: 101 mg/dL — ABNORMAL HIGH (ref 70–99)
Potassium: 4.2 mmol/L (ref 3.5–5.1)
Sodium: 133 mmol/L — ABNORMAL LOW (ref 135–145)

## 2021-05-14 LAB — TROPONIN I (HIGH SENSITIVITY): Troponin I (High Sensitivity): 6 ng/L (ref <18)

## 2021-05-14 MED ORDER — LIDOCAINE 5 % EX PTCH
1.0000 | MEDICATED_PATCH | CUTANEOUS | Status: DC
  Administered 2021-05-14: 1 via TRANSDERMAL
  Filled 2021-05-14: qty 1

## 2021-05-14 MED ORDER — OXYCODONE-ACETAMINOPHEN 5-325 MG PO TABS
1.0000 | ORAL_TABLET | Freq: Three times a day (TID) | ORAL | 0 refills | Status: AC | PRN
Start: 2021-05-14 — End: 2021-05-19

## 2021-05-14 MED ORDER — IBUPROFEN 400 MG PO TABS
400.0000 mg | ORAL_TABLET | Freq: Once | ORAL | Status: AC
  Administered 2021-05-14: 400 mg via ORAL
  Filled 2021-05-14: qty 1

## 2021-05-14 MED ORDER — OXYCODONE HCL 5 MG PO TABS
5.0000 mg | ORAL_TABLET | ORAL | Status: AC
  Administered 2021-05-14: 5 mg via ORAL
  Filled 2021-05-14: qty 1

## 2021-05-14 MED ORDER — ACETAMINOPHEN 500 MG PO TABS
1000.0000 mg | ORAL_TABLET | Freq: Once | ORAL | Status: AC
  Administered 2021-05-14: 1000 mg via ORAL
  Filled 2021-05-14: qty 2

## 2021-05-14 NOTE — ED Notes (Signed)
c-collar applied to pt.
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
## 2021-06-24 ENCOUNTER — Other Ambulatory Visit: Payer: Self-pay

## 2021-06-24 ENCOUNTER — Emergency Department: Payer: Medicaid Other

## 2021-06-24 ENCOUNTER — Encounter: Payer: Self-pay | Admitting: Internal Medicine

## 2021-06-24 ENCOUNTER — Emergency Department
Admission: EM | Admit: 2021-06-24 | Discharge: 2021-06-26 | Disposition: A | Discharge status: Home / Self Care | Payer: Medicaid Other | Attending: Emergency Medicine | Admitting: Emergency Medicine

## 2021-06-24 DIAGNOSIS — Y907 Blood alcohol level of 200-239 mg/100 ml: Secondary | ICD-10-CM | POA: Diagnosis not present

## 2021-06-24 DIAGNOSIS — F1010 Alcohol abuse, uncomplicated: Secondary | ICD-10-CM

## 2021-06-24 DIAGNOSIS — I10 Essential (primary) hypertension: Secondary | ICD-10-CM | POA: Insufficient documentation

## 2021-06-24 DIAGNOSIS — R079 Chest pain, unspecified: Secondary | ICD-10-CM | POA: Diagnosis present

## 2021-06-24 DIAGNOSIS — F1210 Cannabis abuse, uncomplicated: Secondary | ICD-10-CM | POA: Insufficient documentation

## 2021-06-24 DIAGNOSIS — Z20822 Contact with and (suspected) exposure to covid-19: Secondary | ICD-10-CM | POA: Diagnosis not present

## 2021-06-24 DIAGNOSIS — F10120 Alcohol abuse with intoxication, uncomplicated: Secondary | ICD-10-CM | POA: Insufficient documentation

## 2021-06-24 DIAGNOSIS — R0789 Other chest pain: Secondary | ICD-10-CM | POA: Insufficient documentation

## 2021-06-24 DIAGNOSIS — F1410 Cocaine abuse, uncomplicated: Secondary | ICD-10-CM

## 2021-06-24 DIAGNOSIS — K760 Fatty (change of) liver, not elsewhere classified: Secondary | ICD-10-CM | POA: Insufficient documentation

## 2021-06-24 DIAGNOSIS — F17210 Nicotine dependence, cigarettes, uncomplicated: Secondary | ICD-10-CM | POA: Diagnosis not present

## 2021-06-24 DIAGNOSIS — Z79899 Other long term (current) drug therapy: Secondary | ICD-10-CM | POA: Insufficient documentation

## 2021-06-24 LAB — CBC
HCT: 41.6 % (ref 39.0–52.0)
Hemoglobin: 14.3 g/dL (ref 13.0–17.0)
MCH: 32.9 pg (ref 26.0–34.0)
MCHC: 34.4 g/dL (ref 30.0–36.0)
MCV: 95.6 fL (ref 80.0–100.0)
Platelets: 116 10*3/uL — ABNORMAL LOW (ref 150–400)
RBC: 4.35 MIL/uL (ref 4.22–5.81)
RDW: 13.5 % (ref 11.5–15.5)
WBC: 4.3 10*3/uL (ref 4.0–10.5)
nRBC: 0 % (ref 0.0–0.2)

## 2021-06-24 LAB — BASIC METABOLIC PANEL
Anion gap: 13 (ref 5–15)
BUN: 8 mg/dL (ref 6–20)
CO2: 26 mmol/L (ref 22–32)
Calcium: 9.7 mg/dL (ref 8.9–10.3)
Chloride: 99 mmol/L (ref 98–111)
Creatinine, Ser: 0.67 mg/dL (ref 0.61–1.24)
GFR, Estimated: >60 mL/min (ref >60)
Glucose, Bld: 95 mg/dL (ref 70–99)
Potassium: 4 mmol/L (ref 3.5–5.1)
Sodium: 138 mmol/L (ref 135–145)

## 2021-06-24 LAB — TROPONIN I (HIGH SENSITIVITY): Troponin I (High Sensitivity): 11 ng/L (ref <18)

## 2021-06-24 LAB — ETHANOL: Alcohol, Ethyl (B): 230 mg/dL — ABNORMAL HIGH (ref <10)

## 2021-06-24 NOTE — ED Triage Notes (Signed)
Pt to ED via [facility] from a [facility], per EMS pt c/o substernal CP that started yesterday morning, per EMS pain is non-radiating/sharp in nature. Per EMS pt is also coming for detox from crack/ETOH, per EMS pt has no money and now requesting detox.    170/120 (hx of same and does not take his medications)

## 2021-06-24 NOTE — ED Provider Notes (Signed)
[facility] Emergency Department Provider Note  ____________________________________________   Event Date/Time   First MD Initiated Contact with Patient 06/24/21 2343     (approximate)  I have reviewed the triage vital signs and the nursing notes.   HISTORY  Chief Complaint Detox and Chest Pain    HPI Jose [name] is a 60 y.o. male with polysubstance abuse, hip pretension, hep C who comes in with concern for substernal chest pain that started yesterday morning.  Patient reports having no money and now requesting detox.  Patient states that he last drank a few hours ago.  States he drinks 10-12 beers daily.  He also reports he was using crack cocaine yesterday.  Patient has pan positive review of systems.  Every question I asked patient states that it hurts.  However he states that his biggest concern is his chest pain that radiates into his back, constant, started yesterday, nothing makes it better or worse.  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   Substance induced mood disorder ([place]) 11/03/2015   Alcohol use disorder, severe, dependence ([place]) 10/24/2015   Alcohol withdrawal ([place]) 10/24/2015   Stimulant use disorder ([place]) (cocaine) 10/24/2015   Tobacco use disorder 10/24/2015   Hepatitis C 09/06/2015    Past Surgical History:  Procedure Laterality Date   ESOPHAGOGASTRODUODENOSCOPY (EGD) WITH PROPOFOL N/A 03/02/2019   Procedure: ESOPHAGOGASTRODUODENOSCOPY (EGD) WITH PROPOFOL;  Surgeon: Georganna Skeans, MD;  Location: [place];   Service: General;  Laterality: N/A;   IR GASTROSTOMY TUBE REMOVAL  05/04/2019   PEG PLACEMENT N/A 03/02/2019   Procedure: PERCUTANEOUS ENDOSCOPIC GASTROSTOMY (PEG) PLACEMENT;  Surgeon: Georganna Skeans, MD;  Location: [place];  Service: General;  Laterality: N/A;   PERCUTANEOUS TRACHEOSTOMY N/A 02/12/2019   Procedure: PERCUTANEOUS TRACHEOSTOMY;  Surgeon: Georganna Skeans, MD;  Location: [place];  Service: General;  Laterality: N/A;    Prior to Admission medications   Medication Sig Start Date End Date Taking? Authorizing Provider  acetaminophen (TYLENOL) 160 MG/5ML solution Take 20.3 mLs (650 mg total) by mouth every 6 (six) hours as needed for fever. 03/18/19   Meuth, Brooke A, PA-C  Amino Acids-Protein Hydrolys (FEEDING SUPPLEMENT, PRO-STAT SUGAR FREE 64,) LIQD Place 30 mLs into feeding tube 2 (two) times daily. 03/18/19   Meuth, Brooke A, PA-C  amLODipine (NORVASC) 5 MG tablet Take 1 tablet (5 mg total) by mouth daily. 03/19/19   Meuth, Brooke A, PA-C  cephALEXin (KEFLEX) 500 MG capsule Take 1 capsule (500 mg total) by mouth 3 (three) times daily. 09/19/18   Paulette Blanch, MD  chlorhexidine gluconate, MEDLINE KIT, (PERIDEX) 0.12 % solution 15 mLs by Mouth Rinse route 2 (two) times daily. 03/18/19   Meuth, Brooke A, PA-C  clonazePAM (KLONOPIN) 1 MG disintegrating tablet Take 1 tablet (1 mg total) by mouth 2 (two) times daily as needed for seizure (and/or agitation). 03/18/19   Meuth, Blaine Hamper, PA-C  docusate (COLACE) 50 MG/5ML liquid Take 5 mLs (50 mg total) by mouth daily. 03/19/19  Meuth, Brooke A, PA-C  folic acid (FOLVITE) 1 MG tablet Take 1 tablet (1 mg total) by mouth daily. 03/19/19   Meuth, Brooke A, PA-C  guaiFENesin (ROBITUSSIN) 100 MG/5ML SOLN Place 15 mLs (300 mg total) into feeding tube every 6 (six) hours. 03/18/19   Meuth, Blaine Hamper, PA-C  hydrochlorothiazide (MICROZIDE) 12.5 MG capsule Take 2 capsules (25 mg total) by mouth daily. 03/19/19   Meuth, Brooke A, PA-C  metoprolol tartrate  (LOPRESSOR) 25 mg/10 mL SUSP Take 10 mLs (25 mg total) by mouth 2 (two) times daily. 03/18/19   Meuth, Brooke A, PA-C  Multiple Vitamin (MULTIVITAMIN WITH MINERALS) TABS tablet Take 1 tablet by mouth daily. 03/19/19   Meuth, Brooke A, PA-C  Nutritional Supplements (FEEDING SUPPLEMENT, OSMOLITE 1.5 CAL,) LIQD Place 474 mLs into feeding tube 3 (three) times daily. 03/18/19   Meuth, Brooke A, PA-C  ondansetron (ZOFRAN ODT) 4 MG disintegrating tablet Take 1 tablet (4 mg total) by mouth every 8 (eight) hours as needed for nausea or vomiting. 09/19/18   Paulette Blanch, MD  ondansetron (ZOFRAN) 4 MG tablet Take 1 tablet (4 mg total) by mouth every 8 (eight) hours as needed for nausea or vomiting. 03/18/19   Meuth, Jerene Pitch A, PA-C  oxyCODONE (OXY IR/ROXICODONE) 5 MG immediate release tablet Take 1 tablet (5 mg total) by mouth every 6 (six) hours as needed for severe pain. 03/18/19   Meuth, Brooke A, PA-C  pantoprazole (PROTONIX) 20 MG tablet Take 1 tablet (20 mg total) by mouth daily. 03/18/19   Meuth, Brooke A, PA-C  polyethylene glycol (MIRALAX / GLYCOLAX) 17 g packet Take 17 g by mouth daily. 03/19/19   Meuth, Brooke A, PA-C  QUEtiapine (SEROQUEL) 100 MG tablet Take 1 tablet (100 mg total) by mouth at bedtime. 03/18/19   Meuth, Brooke A, PA-C  thiamine 100 MG tablet Take 1 tablet (100 mg total) by mouth daily. 03/19/19   Meuth, Blaine Hamper, PA-C    Allergies Patient has no known allergies.  History reviewed. No pertinent family history.  Social History Social History   Tobacco Use   Smoking status: Every Day    Packs/day: 0.50    Years: 45.00    Pack years: 22.50    Types: Cigarettes   Smokeless tobacco: Never  Vaping Use   Vaping Use: Never used  Substance Use Topics   Alcohol use: Yes    Comment: 12 pack per day   Drug use: Yes    Types: Cocaine    Comment: today      Review of Systems Constitutional: No fever/chills Eyes: No visual changes. ENT: No sore throat. Cardiovascular: Positive  chest pain Respiratory: Denies shortness of breath. Gastrointestinal: No abdominal pain.  No nausea, no vomiting.  No diarrhea.  No constipation. Genitourinary: Negative for dysuria. Musculoskeletal: Positive back pain Skin: Negative for rash. Neurological: Negative for headaches, focal weakness or numbness. All other ROS negative ____________________________________________   PHYSICAL EXAM:  VITAL SIGNS: ED Triage Vitals  Enc Vitals Group     BP 06/24/21 2250 (!) 138/101     Pulse Rate 06/24/21 2250 (!) 102     Resp 06/24/21 2250 18     Temp 06/24/21 2250 98.5 F (36.9 C)     Temp Source 06/24/21 2250 Oral     SpO2 06/24/21 2250 97 %     Weight 06/24/21 2243 150 lb (68 kg)     Height 06/24/21 2243 5' 8"  (1.727 m)     Head Circumference --  Peak Flow --      Pain Score 06/24/21 2242 9     Pain Loc --      Pain Edu? --      Excl. in [place]? --     Constitutional: Alert and oriented. Well appearing and in no acute distress. Eyes: Conjunctivae are normal. EOMI. Head: Atraumatic. Nose: No congestion/rhinnorhea. Mouth/Throat: Mucous membranes are moist.   Neck: No stridor. Trachea Midline. FROM Cardiovascular: Normal rate, regular rhythm. Grossly normal heart sounds.  Good peripheral circulation. Respiratory: Normal respiratory effort.  No retractions. Lungs CTAB. Gastrointestinal: Soft and nontender. No distention. No abdominal bruits.  Musculoskeletal: No lower extremity tenderness nor edema.  No joint effusions. Neurologic:  Normal speech and language. No gross focal neurologic deficits are appreciated.  Skin:  Skin is warm, dry and intact. No rash noted. Psychiatric: Mood and affect are normal. Speech and behavior are normal. GU: Deferred   ____________________________________________   LABS (all labs ordered are listed, but only abnormal results are displayed)  Labs Reviewed  CBC - Abnormal; Notable for the following components:      Result Value   Platelets  116 (*)    All other components within normal limits  ETHANOL - Abnormal; Notable for the following components:   Alcohol, Ethyl (B) 230 (*)    All other components within normal limits  HEPATIC FUNCTION PANEL - Abnormal; Notable for the following components:   Total Protein 8.7 (*)    AST 84 (*)    ALT 45 (*)    All other components within normal limits  LIPASE, BLOOD - Abnormal; Notable for the following components:   Lipase 53 (*)    All other components within normal limits  URINE DRUG SCREEN, QUALITATIVE ([facility] ONLY) - Abnormal; Notable for the following components:   Cocaine Metabolite,Ur [place] POSITIVE (*)    All other components within normal limits  URINALYSIS, ROUTINE W REFLEX MICROSCOPIC - Abnormal; Notable for the following components:   Color, Urine STRAW (*)    APPearance CLEAR (*)    Specific Gravity, Urine 1.004 (*)    Hgb urine dipstick SMALL (*)    Protein, ur 30 (*)    All other components within normal limits  BASIC METABOLIC PANEL  TROPONIN I (HIGH SENSITIVITY)  TROPONIN I (HIGH SENSITIVITY)   ____________________________________________   ED ECG REPORT I, Vanessa Guttenberg, the attending physician, personally viewed and interpreted this ECG.  Sinus tachycardia rate of 106, no ST elevation, T wave version, right bundle branch block  Looks similar to prior EKG ____________________________________________  RADIOLOGY I, Vanessa Natoma, personally viewed and evaluated these images (plain radiographs) as part of my medical decision making, as well as reviewing the written report by the radiologist.  ED MD interpretation: No pneumonia  Official radiology report(s): DG Chest 2 View  Result Date: 06/24/2021 CLINICAL DATA:  Chest pain. EXAM: CHEST - 2 VIEW COMPARISON:  May 14, 2021 FINDINGS: The lungs are hyperinflated. There is no evidence of acute infiltrate, pleural effusion or pneumothorax. The heart size and mediastinal contours are within normal limits. Both  lungs are clear. A chronic eighth left rib fracture is noted. IMPRESSION: No active cardiopulmonary disease. Electronically Signed   By: Virgina Norfolk M.D.   On: 06/24/2021 23:12    ____________________________________________   PROCEDURES  Procedure(s) performed (including Critical Care):  .1-3 Lead EKG Interpretation Performed by: Vanessa Dubach, MD Authorized by: Vanessa , MD     Interpretation: normal  ECG rate:  70s   ECG rate assessment: normal     Rhythm: sinus rhythm     Ectopy: none     Conduction: normal     ____________________________________________   INITIAL IMPRESSION / ASSESSMENT AND PLAN / ED COURSE   Thi Sisemore was evaluated in Emergency Department on 06/24/2021 for the symptoms described in the history of present illness. He was evaluated in the context of the global COVID-19 pandemic, which necessitated consideration that the patient might be at risk for infection with the SARS-CoV-2 virus that causes COVID-19. Institutional protocols and algorithms that pertain to the evaluation of patients at risk for COVID-19 are in a state of rapid change based on information released by regulatory bodies including the CDC and federal and state organizations. These policies and algorithms were followed during the patient's care in the ED.    Most Likely DDx:  -MSK (atypical chest pain) but will get cardiac markers to evaluate for ACS given risk factors/age -Given cocaine use, hypertension and chest pain rating to the back we will also get CT dissection   DDx that was also considered d/t potential to cause harm, but was found less likely based on history and physical (as detailed above): -PNA (no fevers, cough but CXR to evaluate) -PNX (reassured with equal b/l breath sounds, CXR to evaluate) -Symptomatic anemia (will get H&H) -Pulmonary embolism as no sob at rest, not pleuritic in nature, no hypoxia -Pericarditis no rub on exam, EKG changes or hx to  suggest dx -Tamponade (no notable SOB, tachycardic, hypotensive) -Esophageal rupture (no h/o diffuse vomitting/no crepitus)  EtOH level is elevated to 230 similar to prior.  Initial cardiac marker is negative.  Labs are reassuring.    2:23 AM patient still complaining of severe chest pain.  We will get a CT dissection just to ensure no dissection given the crack cocaine, hypertension and daily chest, back pain.  CT scan is negative.  Patient does have fatty liver.  Patient is still requesting detox.  Will place TTS consult.  Patient will be given some droperidol for some continued pain.  Will place on CIWA protocol      ____________________________________________   FINAL CLINICAL IMPRESSION(S) / ED DIAGNOSES   Final diagnoses:  ETOH abuse  Cocaine abuse ([place])     MEDICATIONS GIVEN DURING THIS VISIT:  Medications  droperidol (INAPSINE) 2.5 MG/ML injection 1.25 mg (has no administration in time range)  acetaminophen (TYLENOL) tablet 1,000 mg (1,000 mg Oral Given 06/25/21 0025)  sodium chloride 0.9 % bolus 1,000 mL (0 mLs Intravenous Stopped 06/25/21 0136)  thiamine (B-1) injection 100 mg (100 mg Intravenous Given 06/25/21 0030)  midazolam (VERSED) 5 MG/5ML injection 1 mg (1 mg Intravenous Given 06/25/21 0030)  iohexol (OMNIPAQUE) 350 MG/ML injection 100 mL (100 mLs Intravenous Contrast Given 06/25/21 0230)     ED Discharge Orders     None        Note:  This document was prepared using Dragon voice recognition software and may include unintentional dictation errors.    Vanessa , MD 06/25/21 [phone]

## 2021-06-25 ENCOUNTER — Emergency Department: Payer: Medicaid Other

## 2021-06-25 LAB — URINALYSIS, ROUTINE W REFLEX MICROSCOPIC
Bacteria, UA: NONE SEEN
Bilirubin Urine: NEGATIVE
Glucose, UA: NEGATIVE mg/dL
Ketones, ur: NEGATIVE mg/dL
Leukocytes,Ua: NEGATIVE
Nitrite: NEGATIVE
Protein, ur: 30 mg/dL — AB
Specific Gravity, Urine: 1.004 — ABNORMAL LOW (ref 1.005–1.030)
Squamous Epithelial / HPF: NONE SEEN (ref 0–5)
pH: 6 (ref 5.0–8.0)

## 2021-06-25 LAB — RESP PANEL BY RT-PCR (FLU A&B, COVID) ARPGX2
Influenza A by PCR: NEGATIVE
Influenza B by PCR: NEGATIVE
SARS Coronavirus 2 by RT PCR: NEGATIVE

## 2021-06-25 LAB — URINE DRUG SCREEN, QUALITATIVE (ARMC ONLY)
Amphetamines, Ur Screen: NOT DETECTED
Barbiturates, Ur Screen: NOT DETECTED
Benzodiazepine, Ur Scrn: NOT DETECTED
Cannabinoid 50 Ng, Ur ~~LOC~~: NOT DETECTED
Cocaine Metabolite,Ur ~~LOC~~: POSITIVE — AB
MDMA (Ecstasy)Ur Screen: NOT DETECTED
Methadone Scn, Ur: NOT DETECTED
Opiate, Ur Screen: NOT DETECTED
Phencyclidine (PCP) Ur S: NOT DETECTED
Tricyclic, Ur Screen: NOT DETECTED

## 2021-06-25 LAB — TROPONIN I (HIGH SENSITIVITY): Troponin I (High Sensitivity): 9 ng/L (ref <18)

## 2021-06-25 LAB — HEPATIC FUNCTION PANEL
ALT: 45 U/L — ABNORMAL HIGH (ref 0–44)
AST: 84 U/L — ABNORMAL HIGH (ref 15–41)
Albumin: 4.4 g/dL (ref 3.5–5.0)
Alkaline Phosphatase: 83 U/L (ref 38–126)
Bilirubin, Direct: <0.1 mg/dL (ref 0.0–0.2)
Total Bilirubin: 0.8 mg/dL (ref 0.3–1.2)
Total Protein: 8.7 g/dL — ABNORMAL HIGH (ref 6.5–8.1)

## 2021-06-25 LAB — LIPASE, BLOOD: Lipase: 53 U/L — ABNORMAL HIGH (ref 11–51)

## 2021-06-25 MED ORDER — ACETAMINOPHEN 500 MG PO TABS
1000.0000 mg | ORAL_TABLET | Freq: Once | ORAL | Status: AC
  Administered 2021-06-25: 1000 mg via ORAL
  Filled 2021-06-25: qty 2

## 2021-06-25 MED ORDER — LORAZEPAM 2 MG/ML IJ SOLN: 0.0000 mg | Freq: Four times a day (QID) | INTRAMUSCULAR | Status: DC

## 2021-06-25 MED ORDER — LORAZEPAM 2 MG PO TABS: 0.0000 mg | ORAL_TABLET | Freq: Two times a day (BID) | ORAL | Status: DC

## 2021-06-25 MED ORDER — SODIUM CHLORIDE 0.9 % IV BOLUS
1000.0000 mL | Freq: Once | INTRAVENOUS | Status: AC
  Administered 2021-06-25: 1000 mL via INTRAVENOUS

## 2021-06-25 MED ORDER — MIDAZOLAM HCL 5 MG/5ML IJ SOLN
1.0000 mg | Freq: Once | INTRAMUSCULAR | Status: AC
  Administered 2021-06-25: 1 mg via INTRAVENOUS
  Filled 2021-06-25: qty 5

## 2021-06-25 MED ORDER — THIAMINE HCL 100 MG/ML IJ SOLN
100.0000 mg | Freq: Once | INTRAMUSCULAR | Status: AC
  Administered 2021-06-25: 100 mg via INTRAVENOUS
  Filled 2021-06-25: qty 2

## 2021-06-25 MED ORDER — DROPERIDOL 2.5 MG/ML IJ SOLN: 1.2500 mg | Freq: Once | INTRAMUSCULAR | Status: DC

## 2021-06-25 MED ORDER — THIAMINE HCL 100 MG/ML IJ SOLN: 100.0000 mg | Freq: Every day | INTRAMUSCULAR | Status: DC

## 2021-06-25 MED ORDER — IOHEXOL 350 MG/ML SOLN
100.0000 mL | Freq: Once | INTRAVENOUS | Status: AC | PRN
  Administered 2021-06-25: 100 mL via INTRAVENOUS

## 2021-06-25 MED ORDER — LORAZEPAM 2 MG/ML IJ SOLN: 0.0000 mg | Freq: Two times a day (BID) | INTRAMUSCULAR | Status: DC

## 2021-06-25 MED ORDER — THIAMINE HCL 100 MG PO TABS
100.0000 mg | ORAL_TABLET | Freq: Every day | ORAL | Status: DC
  Administered 2021-06-25: 100 mg via ORAL
  Filled 2021-06-25: qty 1

## 2021-06-25 MED ORDER — LORAZEPAM 2 MG PO TABS
0.0000 mg | ORAL_TABLET | Freq: Four times a day (QID) | ORAL | Status: DC
  Administered 2021-06-25: 1 mg via ORAL
  Filled 2021-06-25: qty 1

## 2021-06-25 MED ORDER — DROPERIDOL 2.5 MG/ML IJ SOLN
1.2500 mg | Freq: Once | INTRAMUSCULAR | Status: AC
  Administered 2021-06-25: 1.25 mg via INTRAVENOUS
  Filled 2021-06-25: qty 2

## 2021-06-25 NOTE — BH Assessment (Addendum)
Referral information for detox/ substance abuse treatment;   [facility] [phone])  [facility] [phone])  [place] [phone] or [phone])  [facility] [phone] -or- [phone]),   [facility] [phone])  [facility] [phone])

## 2021-06-25 NOTE — BH Assessment (Signed)
TTS checked in with [facility] and left message at 11:24 and [facility], Nurse was in process of reviewing referrals and stated she would call back shortly at 11:25.Jose [name], Nurse at [facility] called back at 11:48 stating they did not have male beds at this time, but should have availability this afternoon and would call back...  Jose [name] TTS
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
## 2021-06-25 NOTE — ED Notes (Signed)
Pt transported to CT via stretcher at this time.  

## 2021-06-25 NOTE — ED Notes (Signed)
Patient to 19H to await TTS.  No acute distress noted.

## 2021-06-25 NOTE — ED Notes (Signed)
Vol TTS sent referrals to various placement pending acceptance

## 2021-06-25 NOTE — ED Notes (Signed)
Pt given breakfast tray

## 2021-06-26 NOTE — ED Notes (Signed)
VOL/ pending TTS placement 

## 2021-06-26 NOTE — Discharge Instructions (Addendum)
Please try not to drink or use any other substances.  If you want help with detox and you cannot find it yourself please return here we will help you.

## 2021-06-26 NOTE — ED Notes (Signed)
E-signature not working at this time. Pt verbalized understanding of D/C instructions, prescriptions and follow up care with no further questions at this time. Pt in NAD and ambulatory at time of D/C. Pt wishes not to wait at this time for placement. Resources given to patient. Pt denies SI/HI.

## 2021-06-26 NOTE — ED Provider Notes (Signed)
Patient is now feeling well.  His blood pressure and heart rate are within normal limits.  He does not want to stay any longer.  TTS will give him resources for detox if he wishes to pursue that.  His troponins are dropping and the rest of his evaluation for his chest pain etc. were negative.  His drug screen was positive for cocaine.   Arnaldo Natal, MD 06/26/21 1102

## 2021-06-26 NOTE — ED Provider Notes (Signed)
Emergency Medicine Observation Re-evaluation Note  Jose [name] is a 60 y.o. male, seen on rounds today.  Pt initially presented to the ED for complaints of Detox and Chest Pain  Currently, the patient is currently sleeping in hallway bed.   Physical Exam  Blood pressure (!) 184/122, pulse (!) 123, temperature 98.2 F (36.8 C), temperature source Oral, resp. rate 15, height 5\' 8"  (1.727 m), weight 68 kg, SpO2 98 %.  Physical Exam General: No apparent distress, resting in bed  HEENT: moist mucous membranes Pulm: Normal WOB GI: soft and non tender Neuro: sleeping      ED Course / MDM     I have reviewed the labs performed to date as well as medications administered while in observation.  Recent changes in the last 24 hours include none   Plan   Current plan is to continue to wait for placement for substance abuse  Patient is not under full IVC at this time.   , MD 06/26/21 [phone]

## 2021-06-26 NOTE — BH Assessment (Signed)
Writer provided patient with local resources for detox.

## 2021-07-02 ENCOUNTER — Emergency Department
Admission: EM | Admit: 2021-07-02 | Discharge: 2021-07-02 | Disposition: A | Discharge status: Home / Self Care | Payer: Medicaid Other | Attending: Emergency Medicine | Admitting: Emergency Medicine

## 2021-07-02 ENCOUNTER — Other Ambulatory Visit: Payer: Self-pay

## 2021-07-02 DIAGNOSIS — Z79899 Other long term (current) drug therapy: Secondary | ICD-10-CM | POA: Diagnosis not present

## 2021-07-02 DIAGNOSIS — F10929 Alcohol use, unspecified with intoxication, unspecified: Secondary | ICD-10-CM

## 2021-07-02 DIAGNOSIS — G8929 Other chronic pain: Secondary | ICD-10-CM | POA: Diagnosis not present

## 2021-07-02 DIAGNOSIS — I10 Essential (primary) hypertension: Secondary | ICD-10-CM | POA: Diagnosis not present

## 2021-07-02 DIAGNOSIS — F17210 Nicotine dependence, cigarettes, uncomplicated: Secondary | ICD-10-CM | POA: Diagnosis not present

## 2021-07-02 DIAGNOSIS — F10129 Alcohol abuse with intoxication, unspecified: Secondary | ICD-10-CM | POA: Diagnosis present

## 2021-07-02 DIAGNOSIS — Y908 Blood alcohol level of 240 mg/100 ml or more: Secondary | ICD-10-CM | POA: Insufficient documentation

## 2021-07-02 LAB — CBC WITH DIFFERENTIAL/PLATELET
Abs Immature Granulocytes: 0.06 10*3/uL (ref 0.00–0.07)
Basophils Absolute: 0 10*3/uL (ref 0.0–0.1)
Basophils Relative: 0 %
Eosinophils Absolute: 0 10*3/uL (ref 0.0–0.5)
Eosinophils Relative: 1 %
HCT: 40.1 % (ref 39.0–52.0)
Hemoglobin: 14 g/dL (ref 13.0–17.0)
Immature Granulocytes: 1 %
Lymphocytes Relative: 38 %
Lymphs Abs: 2.5 10*3/uL (ref 0.7–4.0)
MCH: 34 pg (ref 26.0–34.0)
MCHC: 34.9 g/dL (ref 30.0–36.0)
MCV: 97.3 fL (ref 80.0–100.0)
Monocytes Absolute: 0.7 10*3/uL (ref 0.1–1.0)
Monocytes Relative: 11 %
Neutro Abs: 3.4 10*3/uL (ref 1.7–7.7)
Neutrophils Relative %: 49 %
Platelets: 151 10*3/uL (ref 150–400)
RBC: 4.12 MIL/uL — ABNORMAL LOW (ref 4.22–5.81)
RDW: 13.1 % (ref 11.5–15.5)
WBC: 6.8 10*3/uL (ref 4.0–10.5)
nRBC: 0 % (ref 0.0–0.2)

## 2021-07-02 LAB — BASIC METABOLIC PANEL
Anion gap: 11 (ref 5–15)
BUN: 7 mg/dL (ref 6–20)
CO2: 27 mmol/L (ref 22–32)
Calcium: 9.7 mg/dL (ref 8.9–10.3)
Chloride: 98 mmol/L (ref 98–111)
Creatinine, Ser: 0.62 mg/dL (ref 0.61–1.24)
GFR, Estimated: >60 mL/min (ref >60)
Glucose, Bld: 100 mg/dL — ABNORMAL HIGH (ref 70–99)
Potassium: 4.1 mmol/L (ref 3.5–5.1)
Sodium: 136 mmol/L (ref 135–145)

## 2021-07-02 LAB — ETHANOL: Alcohol, Ethyl (B): 272 mg/dL — ABNORMAL HIGH (ref <10)

## 2021-07-02 LAB — TROPONIN I (HIGH SENSITIVITY): Troponin I (High Sensitivity): 6 ng/L (ref <18)

## 2021-07-02 MED ORDER — DROPERIDOL 2.5 MG/ML IJ SOLN: 2.5000 mg | Freq: Once | INTRAMUSCULAR | Status: DC

## 2021-07-02 MED ORDER — DIPHENHYDRAMINE HCL 50 MG/ML IJ SOLN: 12.5000 mg | INTRAMUSCULAR | Status: DC

## 2021-07-02 MED ORDER — DROPERIDOL 2.5 MG/ML IJ SOLN: 2.5000 mg | Freq: Once | INTRAMUSCULAR | Status: DC | PRN

## 2021-07-02 NOTE — ED Notes (Signed)
Pt ambulatory with steady gait around the room. Ambulatory to wheelchair.

## 2021-07-02 NOTE — ED Provider Notes (Signed)
[facility] Emergency Department Provider Note  ____________________________________________   Event Date/Time   First MD Initiated Contact with Patient 07/02/21 0104     (approximate)  I have reviewed the triage vital signs and the nursing notes.   HISTORY  Chief Complaint Drug Problem  Level 5 caveat: History may be limited by patient's acute intoxication.  HPI Jose [name] is a 60 y.o. male with history as listed below which notably includes polysubstance abuse, specifically crack cocaine, as well as alcohol abuse.  He was also diagnosed with a C7 fracture 2 months ago and recommended neurosurgical follow-up.  He was seen in the emergency department recently for substance abuse.  Denied he is brought in by EMS after being picked up at a gas station.  He reports his chief complaint is "total body pain".  He said that his neck hurts and has been hurting for 2 months, but also his chest, both arms, both legs, and the rest of his body also hurts.  He has had no new injuries.  He told me that 6 years ago he was admitted to [facility] and given some oxycodone.  He claims to have not taken any medication recently, however he reports drinking several beers today and doing crack cocaine yesterday.  He is ambulatory and has no new numbness nor tingling nor weakness in any extremity.  He said his pain is severe and that he has chronic pain and he is tired of hurting.     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/14/2015   Substance induced mood disorder ([place]) 11/03/2015   Alcohol use disorder,  severe, dependence ([place]) 10/24/2015   Alcohol withdrawal ([place]) 10/24/2015   Stimulant use disorder ([place]) (cocaine) 10/24/2015   Tobacco use disorder 10/24/2015   Hepatitis C 09/06/2015    Past Surgical History:  Procedure Laterality Date   ESOPHAGOGASTRODUODENOSCOPY (EGD) WITH PROPOFOL N/A 03/02/2019   Procedure: ESOPHAGOGASTRODUODENOSCOPY (EGD) WITH PROPOFOL;  Surgeon: Georganna Skeans, MD;  Location: [place];  Service: General;  Laterality: N/A;   IR GASTROSTOMY TUBE REMOVAL  05/04/2019   PEG PLACEMENT N/A 03/02/2019   Procedure: PERCUTANEOUS ENDOSCOPIC GASTROSTOMY (PEG) PLACEMENT;  Surgeon: Georganna Skeans, MD;  Location: [place];  Service: General;  Laterality: N/A;   PERCUTANEOUS TRACHEOSTOMY N/A 02/12/2019   Procedure: PERCUTANEOUS TRACHEOSTOMY;  Surgeon: Georganna Skeans, MD;  Location: [place];  Service: General;  Laterality: N/A;    Prior to Admission medications   Medication Sig Start Date End Date Taking? Authorizing Provider  acetaminophen (TYLENOL) 160 MG/5ML solution Take 20.3 mLs (650 mg total) by mouth every 6 (six) hours as needed for fever. Patient not taking: Reported on 06/25/2021 03/18/19   Margie Billet A, PA-C  Amino Acids-Protein Hydrolys (FEEDING SUPPLEMENT, PRO-STAT SUGAR FREE 64,) LIQD Place 30 mLs into feeding tube 2 (two) times daily. 03/18/19   Meuth, Brooke A, PA-C  amLODipine (NORVASC) 5 MG tablet Take 1 tablet (5 mg total) by mouth daily. Patient not taking: Reported on 06/25/2021 03/19/19   Margie Billet A, PA-C  cephALEXin (KEFLEX) 500 MG capsule Take 1 capsule (500 mg  total) by mouth 3 (three) times daily. Patient not taking: Reported on 06/25/2021 09/19/18   Paulette Blanch, MD  chlorhexidine gluconate, MEDLINE KIT, (PERIDEX) 0.12 % solution 15 mLs by Mouth Rinse route 2 (two) times daily. Patient not taking: Reported on 06/25/2021 03/18/19   Margie Billet A, PA-C  clonazePAM (KLONOPIN) 1 MG disintegrating tablet Take 1 tablet (1 mg total) by mouth 2 (two) times  daily as needed for seizure (and/or agitation). Patient not taking: Reported on 06/25/2021 03/18/19   Wellington Hampshire, PA-C  docusate (COLACE) 50 MG/5ML liquid Take 5 mLs (50 mg total) by mouth daily. Patient not taking: Reported on 06/25/2021 03/19/19   Wellington Hampshire, PA-C  folic acid (FOLVITE) 1 MG tablet Take 1 tablet (1 mg total) by mouth daily. Patient not taking: Reported on 06/25/2021 03/19/19   Margie Billet A, PA-C  guaiFENesin (ROBITUSSIN) 100 MG/5ML SOLN Place 15 mLs (300 mg total) into feeding tube every 6 (six) hours. Patient not taking: Reported on 06/25/2021 03/18/19   Wellington Hampshire, PA-C  hydrochlorothiazide (MICROZIDE) 12.5 MG capsule Take 2 capsules (25 mg total) by mouth daily. Patient not taking: Reported on 06/25/2021 03/19/19   Margie Billet A, PA-C  metoprolol tartrate (LOPRESSOR) 25 mg/10 mL SUSP Take 10 mLs (25 mg total) by mouth 2 (two) times daily. Patient not taking: Reported on 06/25/2021 03/18/19   Margie Billet A, PA-C  Multiple Vitamin (MULTIVITAMIN WITH MINERALS) TABS tablet Take 1 tablet by mouth daily. Patient not taking: Reported on 06/25/2021 03/19/19   Margie Billet A, PA-C  Nutritional Supplements (FEEDING SUPPLEMENT, OSMOLITE 1.5 CAL,) LIQD Place 474 mLs into feeding tube 3 (three) times daily. 03/18/19   Meuth, Brooke A, PA-C  ondansetron (ZOFRAN ODT) 4 MG disintegrating tablet Take 1 tablet (4 mg total) by mouth every 8 (eight) hours as needed for nausea or vomiting. Patient not taking: Reported on 06/25/2021 09/19/18   Paulette Blanch, MD  ondansetron (ZOFRAN) 4 MG tablet Take 1 tablet (4 mg total) by mouth every 8 (eight) hours as needed for nausea or vomiting. Patient not taking: Reported on 06/25/2021 03/18/19   Margie Billet A, PA-C  oxyCODONE (OXY IR/ROXICODONE) 5 MG immediate release tablet Take 1 tablet (5 mg total) by mouth every 6 (six) hours as needed for severe pain. Patient not taking: Reported on 06/25/2021 03/18/19   Margie Billet A, PA-C  pantoprazole  (PROTONIX) 20 MG tablet Take 1 tablet (20 mg total) by mouth daily. Patient not taking: Reported on 06/25/2021 03/18/19   Margie Billet A, PA-C  polyethylene glycol (MIRALAX / GLYCOLAX) 17 g packet Take 17 g by mouth daily. Patient not taking: Reported on 06/25/2021 03/19/19   Margie Billet A, PA-C  QUEtiapine (SEROQUEL) 100 MG tablet Take 1 tablet (100 mg total) by mouth at bedtime. Patient not taking: Reported on 06/25/2021 03/18/19   Margie Billet A, PA-C  thiamine 100 MG tablet Take 1 tablet (100 mg total) by mouth daily. Patient not taking: Reported on 06/25/2021 03/19/19   Wellington Hampshire, PA-C    Allergies Patient has no known allergies.  No family history on file.  Social History Social History   Tobacco Use   Smoking status: Every Day    Packs/day: 0.50    Years: 45.00    Pack years: 22.50    Types: Cigarettes   Smokeless tobacco: Never  Vaping Use   Vaping Use: Never used  Substance Use Topics   Alcohol use: Yes    Comment:  12 pack per day   Drug use: Yes    Types: Cocaine    Comment: today    Review of Systems Level 5 caveat: History may be limited by patient's acute intoxication.  The patient reports pain over his entire body.  He admits to recent alcohol and crack cocaine use.  ____________________________________________   PHYSICAL EXAM:  VITAL SIGNS: ED Triage Vitals  Enc Vitals Group     BP 07/02/21 0111 (!) 164/106     Pulse Rate 07/02/21 0111 84     Resp 07/02/21 0111 16     Temp 07/02/21 0111 97.9 F (36.6 C)     Temp Source 07/02/21 0111 Oral     SpO2 07/02/21 0111 99 %     Weight 07/02/21 0112 68 kg (150 lb)     Height 07/02/21 0112 1.727 m (5' 8" )     Head Circumference --      Peak Flow --      Pain Score --      Pain Loc --      Pain Edu? --      Excl. in [place]? --     Constitutional: Alert and oriented.  Appears slightly intoxicated. Eyes: Conjunctivae are normal.  Head: Atraumatic. Nose: No congestion/rhinnorhea. Mouth/Throat:  Patient is wearing a mask. Neck: No stridor.  No meningeal signs.   Cardiovascular: Normal rate, regular rhythm. Good peripheral circulation. Respiratory: Normal respiratory effort.  No retractions. Gastrointestinal: Soft and nondistended.  Diffusely tender to palpation throughout the abdomen with no rebound and no guarding. Musculoskeletal: No lower extremity tenderness nor edema. No gross deformities of extremities. Neurologic: Slightly slurred speech and language. No gross focal neurologic deficits are appreciated.  Skin:  Skin is warm, dry and intact.   ____________________________________________   LABS (all labs ordered are listed, but only abnormal results are displayed)  Labs Reviewed  CBC WITH DIFFERENTIAL/PLATELET - Abnormal; Notable for the following components:      Result Value   RBC 4.12 (*)    All other components within normal limits  BASIC METABOLIC PANEL - Abnormal; Notable for the following components:   Glucose, Bld 100 (*)    All other components within normal limits  ETHANOL - Abnormal; Notable for the following components:   Alcohol, Ethyl (B) 272 (*)    All other components within normal limits  TROPONIN I (HIGH SENSITIVITY)   ____________________________________________  EKG  ED ECG REPORT I, Hinda Kehr, the attending physician, personally viewed and interpreted this ECG.  Date: 07/02/2021 EKG Time: 1:26 AM Rate: 79 Rhythm: normal sinus rhythm QRS Axis: normal Intervals: Right bundle branch block ST/T Wave abnormalities: Non-specific ST segment / T-wave changes, but no clear evidence of acute ischemia. Narrative Interpretation: no definitive evidence of acute ischemia; does not meet STEMI criteria.  ____________________________________________   .1-3 Lead EKG Interpretation Performed by: Hinda Kehr, MD Authorized by: Hinda Kehr, MD     Interpretation: normal     ECG rate:  70   ECG rate assessment: normal     Rhythm: sinus rhythm      Ectopy: none     Conduction: normal     ____________________________________________  INITIAL IMPRESSION / MDM / ASSESSMENT AND PLAN / ED COURSE  As part of my medical decision making, I reviewed the following data within the [place] notes reviewed and incorporated, Labs reviewed , EKG interpreted , and Notes from prior ED visits   Differential diagnosis includes, but is not limited  to, alcohol intoxication, substance abuse, substance-induced mood disorder, chronic pain.  The patient is on the cardiac monitor to evaluate for evidence of arrhythmia and/or significant heart rate changes.  No evidence of acute emergent medical condition.  Vital signs are stable and he appears to be in no distress.  He has had similar presentations in the past.  Low risk for ACS based on HEAR score.  Strongly suspect his issues are related to his social situation and substance abuse.  We will monitor the patient and check basic labs but anticipate discharge when clinically sober.  Of note I did review the medical records and see that he had a cervical spine fracture of C7 about 2 months ago.  He said his neck always hurts since then but the pain is no worse than usual and he has no neurological deficits with good major muscle groups strength in the arms and the legs and he is ambulatory.  There is no indication for additional imaging nor emergent neurosurgical consultation.  I also saw the medical record where he did not follow-up with Dr. Izora Ribas but again this was 2 months ago.     Clinical Course as of 07/02/21 0741  Sun Jul 02, 2021  0159 Alcohol, Ethyl (B)(!): 272 [CF]  0159 CBC within normal limits [CF]  1062 Basic metabolic panel(!) Basic metabolic panel reassuring and values are within normal limits.  High-sensitivity troponin is normal and there is no indication to repeat. [CF]  0423 Patient became agitated and started pulling off his leads and monitoring.  With verbal  redirection he calmed down and settle down but if he continues to be agitated then he will need a calming agent such as droperidol because he does not have the capacity to leave at this time given his acute intoxication. [CF]  [id number] Patient has been stable overnight.  He is alert and oriented, ambulatory without difficulty.  I am discharging him with some recommendations for outpatient substance abuse resources and I am also encouraging him to go back and see the neurosurgeon he was supposed to see 2 months ago. [CF]  0739 Troponin I (High Sensitivity): 6 [CF]    Clinical Course User Index [CF] Hinda Kehr, MD     ____________________________________________  FINAL CLINICAL IMPRESSION(S) / ED DIAGNOSES  Final diagnoses:  Alcoholic intoxication with complication ([place])  Other chronic pain     MEDICATIONS GIVEN DURING THIS VISIT:  Medications  diphenhydrAMINE (BENADRYL) injection 12.5 mg (0 mg Intravenous Hold 07/02/21 0147)  droperidol (INAPSINE) 2.5 MG/ML injection 2.5 mg (has no administration in time range)     ED Discharge Orders     None        Note:  This document was prepared using Dragon voice recognition software and may include unintentional dictation errors.   Hinda Kehr, MD 07/02/21 [phone]

## 2021-07-02 NOTE — ED Triage Notes (Signed)
Patient BIB EMS from a gas station due to "whole body pain", patient with history of chronic pain. Patient states he drank two beers tonight and did crack cocaine yesterday. Patient states he is homeless and wants detox. Patient is in NAD, AxOx4.

## 2021-07-02 NOTE — Discharge Instructions (Addendum)
You were seen tonight for alcohol intoxication and chronic pain.  There is no evidence that you have an emergent medical condition or require hospitalization at this time.  We recommend that you consider following up with one of the resources listed in the outpatient resource guide for substance abuse and counseling.  You should also consider following up with Dr. Myer Haff with neurosurgery as was previously recommended to for your neck injury.

## 2021-08-15 ENCOUNTER — Encounter: Payer: Self-pay | Admitting: Emergency Medicine

## 2021-08-15 ENCOUNTER — Other Ambulatory Visit: Payer: Self-pay

## 2021-08-15 ENCOUNTER — Emergency Department: Payer: Medicaid Other

## 2021-08-15 DIAGNOSIS — Z23 Encounter for immunization: Secondary | ICD-10-CM | POA: Diagnosis not present

## 2021-08-15 DIAGNOSIS — K529 Noninfective gastroenteritis and colitis, unspecified: Secondary | ICD-10-CM | POA: Diagnosis not present

## 2021-08-15 DIAGNOSIS — F17210 Nicotine dependence, cigarettes, uncomplicated: Secondary | ICD-10-CM | POA: Diagnosis not present

## 2021-08-15 DIAGNOSIS — Z760 Encounter for issue of repeat prescription: Secondary | ICD-10-CM | POA: Diagnosis not present

## 2021-08-15 DIAGNOSIS — F1010 Alcohol abuse, uncomplicated: Secondary | ICD-10-CM | POA: Diagnosis not present

## 2021-08-15 DIAGNOSIS — Y906 Blood alcohol level of 120-199 mg/100 ml: Secondary | ICD-10-CM | POA: Insufficient documentation

## 2021-08-15 DIAGNOSIS — I10 Essential (primary) hypertension: Secondary | ICD-10-CM | POA: Diagnosis not present

## 2021-08-15 DIAGNOSIS — R1110 Vomiting, unspecified: Secondary | ICD-10-CM | POA: Diagnosis not present

## 2021-08-15 DIAGNOSIS — S80812A Abrasion, left lower leg, initial encounter: Secondary | ICD-10-CM | POA: Insufficient documentation

## 2021-08-15 DIAGNOSIS — Z79899 Other long term (current) drug therapy: Secondary | ICD-10-CM | POA: Diagnosis not present

## 2021-08-15 DIAGNOSIS — S8991XA Unspecified injury of right lower leg, initial encounter: Secondary | ICD-10-CM | POA: Diagnosis present

## 2021-08-15 DIAGNOSIS — F10120 Alcohol abuse with intoxication, uncomplicated: Secondary | ICD-10-CM | POA: Diagnosis not present

## 2021-08-15 DIAGNOSIS — X58XXXA Exposure to other specified factors, initial encounter: Secondary | ICD-10-CM | POA: Diagnosis not present

## 2021-08-15 DIAGNOSIS — R0602 Shortness of breath: Secondary | ICD-10-CM | POA: Insufficient documentation

## 2021-08-15 DIAGNOSIS — R1084 Generalized abdominal pain: Secondary | ICD-10-CM | POA: Insufficient documentation

## 2021-08-15 DIAGNOSIS — R0789 Other chest pain: Secondary | ICD-10-CM | POA: Insufficient documentation

## 2021-08-15 DIAGNOSIS — R197 Diarrhea, unspecified: Secondary | ICD-10-CM | POA: Insufficient documentation

## 2021-08-15 LAB — COMPREHENSIVE METABOLIC PANEL
ALT: 69 U/L — ABNORMAL HIGH (ref 0–44)
AST: 108 U/L — ABNORMAL HIGH (ref 15–41)
Albumin: 4.2 g/dL (ref 3.5–5.0)
Alkaline Phosphatase: 69 U/L (ref 38–126)
Anion gap: 10 (ref 5–15)
BUN: 8 mg/dL (ref 6–20)
CO2: 23 mmol/L (ref 22–32)
Calcium: 9.1 mg/dL (ref 8.9–10.3)
Chloride: 101 mmol/L (ref 98–111)
Creatinine, Ser: 0.78 mg/dL (ref 0.61–1.24)
GFR, Estimated: >60 mL/min (ref >60)
Glucose, Bld: 112 mg/dL — ABNORMAL HIGH (ref 70–99)
Potassium: 3.5 mmol/L (ref 3.5–5.1)
Sodium: 134 mmol/L — ABNORMAL LOW (ref 135–145)
Total Bilirubin: 0.6 mg/dL (ref 0.3–1.2)
Total Protein: 7.8 g/dL (ref 6.5–8.1)

## 2021-08-15 LAB — CBC
HCT: 41.7 % (ref 39.0–52.0)
Hemoglobin: 14.4 g/dL (ref 13.0–17.0)
MCH: 33.6 pg (ref 26.0–34.0)
MCHC: 34.5 g/dL (ref 30.0–36.0)
MCV: 97.4 fL (ref 80.0–100.0)
Platelets: 112 10*3/uL — ABNORMAL LOW (ref 150–400)
RBC: 4.28 MIL/uL (ref 4.22–5.81)
RDW: 12.5 % (ref 11.5–15.5)
WBC: 5.3 10*3/uL (ref 4.0–10.5)
nRBC: 0 % (ref 0.0–0.2)

## 2021-08-15 LAB — TROPONIN I (HIGH SENSITIVITY)
Troponin I (High Sensitivity): 11 ng/L (ref <18)
Troponin I (High Sensitivity): 8 ng/L (ref <18)

## 2021-08-15 LAB — LIPASE, BLOOD: Lipase: 57 U/L — ABNORMAL HIGH (ref 11–51)

## 2021-08-15 NOTE — ED Triage Notes (Signed)
Arrives via [facility].  C/O chest pain x 2 days and abdominal pain x several weeks.  C/O painful inspiration.  Abdomen tender to palpation.  VS wnl.  12-lead unremarkable.  324 mg ASA give PTA

## 2021-08-15 NOTE — ED Triage Notes (Signed)
Pt to ED via EMS c/o left chest pain x2 days getting worse, also abd pain and headache.  N/V x2 today.  Pt given 324 ASA via EMS.  Had 40oz beer today and cocaine 3 days ago.  Pt A&Ox4, chest rise even and unlabored, skin WNL and in NAD at this time.

## 2021-08-16 ENCOUNTER — Emergency Department
Admission: EM | Admit: 2021-08-16 | Discharge: 2021-08-16 | Disposition: A | Discharge status: Home / Self Care | Payer: Medicaid Other | Attending: Emergency Medicine | Admitting: Emergency Medicine

## 2021-08-16 ENCOUNTER — Emergency Department: Payer: Medicaid Other

## 2021-08-16 ENCOUNTER — Encounter: Payer: Self-pay | Admitting: Emergency Medicine

## 2021-08-16 ENCOUNTER — Other Ambulatory Visit: Payer: Self-pay

## 2021-08-16 DIAGNOSIS — I10 Essential (primary) hypertension: Secondary | ICD-10-CM | POA: Insufficient documentation

## 2021-08-16 DIAGNOSIS — K529 Noninfective gastroenteritis and colitis, unspecified: Secondary | ICD-10-CM | POA: Insufficient documentation

## 2021-08-16 DIAGNOSIS — F10920 Alcohol use, unspecified with intoxication, uncomplicated: Secondary | ICD-10-CM

## 2021-08-16 DIAGNOSIS — Z760 Encounter for issue of repeat prescription: Secondary | ICD-10-CM | POA: Insufficient documentation

## 2021-08-16 DIAGNOSIS — F17210 Nicotine dependence, cigarettes, uncomplicated: Secondary | ICD-10-CM | POA: Insufficient documentation

## 2021-08-16 DIAGNOSIS — R0789 Other chest pain: Secondary | ICD-10-CM

## 2021-08-16 DIAGNOSIS — R1084 Generalized abdominal pain: Secondary | ICD-10-CM

## 2021-08-16 DIAGNOSIS — S80812A Abrasion, left lower leg, initial encounter: Secondary | ICD-10-CM

## 2021-08-16 DIAGNOSIS — Z79899 Other long term (current) drug therapy: Secondary | ICD-10-CM | POA: Insufficient documentation

## 2021-08-16 DIAGNOSIS — F1010 Alcohol abuse, uncomplicated: Secondary | ICD-10-CM | POA: Insufficient documentation

## 2021-08-16 LAB — URINALYSIS, COMPLETE (UACMP) WITH MICROSCOPIC
Bacteria, UA: NONE SEEN
Bilirubin Urine: NEGATIVE
Glucose, UA: NEGATIVE mg/dL
Ketones, ur: NEGATIVE mg/dL
Leukocytes,Ua: NEGATIVE
Nitrite: NEGATIVE
Protein, ur: NEGATIVE mg/dL
Specific Gravity, Urine: 1.008 (ref 1.005–1.030)
WBC, UA: NONE SEEN WBC/hpf (ref 0–5)
pH: 5 (ref 5.0–8.0)

## 2021-08-16 LAB — CBC
HCT: 39.3 % (ref 39.0–52.0)
Hemoglobin: 13.4 g/dL (ref 13.0–17.0)
MCH: 33.4 pg (ref 26.0–34.0)
MCHC: 34.1 g/dL (ref 30.0–36.0)
MCV: 98 fL (ref 80.0–100.0)
Platelets: 100 10*3/uL — ABNORMAL LOW (ref 150–400)
RBC: 4.01 MIL/uL — ABNORMAL LOW (ref 4.22–5.81)
RDW: 12.5 % (ref 11.5–15.5)
WBC: 6.2 10*3/uL (ref 4.0–10.5)
nRBC: 0 % (ref 0.0–0.2)

## 2021-08-16 LAB — BASIC METABOLIC PANEL
Anion gap: 9 (ref 5–15)
BUN: 7 mg/dL (ref 6–20)
CO2: 25 mmol/L (ref 22–32)
Calcium: 8.9 mg/dL (ref 8.9–10.3)
Chloride: 96 mmol/L — ABNORMAL LOW (ref 98–111)
Creatinine, Ser: 0.67 mg/dL (ref 0.61–1.24)
GFR, Estimated: >60 mL/min (ref >60)
Glucose, Bld: 95 mg/dL (ref 70–99)
Potassium: 4 mmol/L (ref 3.5–5.1)
Sodium: 130 mmol/L — ABNORMAL LOW (ref 135–145)

## 2021-08-16 LAB — ETHANOL: Alcohol, Ethyl (B): 180 mg/dL — ABNORMAL HIGH (ref <10)

## 2021-08-16 LAB — TROPONIN I (HIGH SENSITIVITY): Troponin I (High Sensitivity): 8 ng/L (ref <18)

## 2021-08-16 MED ORDER — KETOROLAC TROMETHAMINE 30 MG/ML IJ SOLN
30.0000 mg | Freq: Once | INTRAMUSCULAR | Status: AC
  Administered 2021-08-16: 30 mg via INTRAVENOUS
  Filled 2021-08-16: qty 1

## 2021-08-16 MED ORDER — BACITRACIN ZINC 500 UNIT/GM EX OINT
TOPICAL_OINTMENT | Freq: Once | CUTANEOUS | Status: AC
  Filled 2021-08-16: qty 1.8

## 2021-08-16 MED ORDER — MORPHINE SULFATE (PF) 4 MG/ML IV SOLN
4.0000 mg | Freq: Once | INTRAVENOUS | Status: AC
  Administered 2021-08-16: 4 mg via INTRAVENOUS
  Filled 2021-08-16: qty 1

## 2021-08-16 MED ORDER — DICYCLOMINE HCL 10 MG/ML IM SOLN
20.0000 mg | Freq: Once | INTRAMUSCULAR | Status: AC
  Administered 2021-08-16: 20 mg via INTRAMUSCULAR
  Filled 2021-08-16: qty 2

## 2021-08-16 MED ORDER — TETANUS-DIPHTH-ACELL PERTUSSIS 5-2.5-18.5 LF-MCG/0.5 IM SUSY
0.5000 mL | PREFILLED_SYRINGE | Freq: Once | INTRAMUSCULAR | Status: AC
  Administered 2021-08-16: 0.5 mL via INTRAMUSCULAR
  Filled 2021-08-16: qty 0.5

## 2021-08-16 MED ORDER — DICYCLOMINE HCL 20 MG PO TABS: 20.0000 mg | ORAL_TABLET | Freq: Three times a day (TID) | ORAL | 0 refills | Status: DC | PRN

## 2021-08-16 MED ORDER — SODIUM CHLORIDE 0.9 % IV BOLUS (SEPSIS)
1000.0000 mL | Freq: Once | INTRAVENOUS | Status: AC
  Administered 2021-08-16: 1000 mL via INTRAVENOUS

## 2021-08-16 MED ORDER — ONDANSETRON HCL 4 MG/2ML IJ SOLN
4.0000 mg | Freq: Once | INTRAMUSCULAR | Status: AC
  Administered 2021-08-16: 4 mg via INTRAVENOUS
  Filled 2021-08-16: qty 2

## 2021-08-16 MED ORDER — ONDANSETRON 4 MG PO TBDP: 4.0000 mg | ORAL_TABLET | Freq: Four times a day (QID) | ORAL | 0 refills | Status: DC | PRN

## 2021-08-16 MED ORDER — IOHEXOL 300 MG/ML  SOLN
100.0000 mL | Freq: Once | INTRAMUSCULAR | Status: AC | PRN
  Administered 2021-08-16: 100 mL via INTRAVENOUS

## 2021-08-16 MED ORDER — BACITRACIN 500 UNIT/GM EX OINT: 1.0000 "application " | TOPICAL_OINTMENT | Freq: Two times a day (BID) | CUTANEOUS | 0 refills | Status: DC

## 2021-08-16 NOTE — Discharge Instructions (Addendum)
Steps to find a Primary Care Provider (PCP):  Call [phone] or [phone] to access "[place] Find a Doctor Service."  2.  You may also go on the [facility] website at [email or web address]  

## 2021-08-16 NOTE — ED Provider Notes (Signed)
Emergency Medicine Provider Triage Evaluation Note  Brion Sossamon , a 60 y.o. male  was evaluated in triage.  Pt complains of multiple medical complaints consisting of chest pain, left leg pain and abdominal pain.  Patient was seen earlier today for the same complaints, had negative work-up and was sent home with prescriptions, patient states he was unable to get his medications filled.  Review of Systems  Positive: Chest pain, abdominal pain, left leg pain Negative: Shortness of breath, fever  Physical Exam  BP (!) 150/89 (BP Location: Left Arm)   Pulse 93   Temp 98.5 F (36.9 C) (Oral)   Resp 18   Ht 5\' 8"  (1.727 m)   Wt 68 kg   SpO2 96%   BMI 22.81 kg/m  Gen:   Awake, no distress   Resp:  Normal effort  MSK:   Moves extremities without difficulty, dressing clean dry and intact left lower extremity Other:    Medical Decision Making  Medically screening exam initiated at 9:25 PM.  Appropriate orders placed.  Mordecai Tindol was informed that the remainder of the evaluation will be completed by another provider, this initial triage assessment does not replace that evaluation, and the importance of remaining in the ED until their evaluation is complete.     Cira Rue 08/16/21 2132    2133, MD 08/17/21 1046

## 2021-08-16 NOTE — ED Triage Notes (Signed)
Pt arrived via [facility] from home with reports of  L lower leg pain, low abd pain. Pt states he lost his release papers and did not get his medications filled.   PT ambulatory without assistance.  Pt has dressed to L lower leg.

## 2021-08-16 NOTE — ED Provider Notes (Signed)
[facility] Emergency Department Provider Note  ____________________________________________   Event Date/Time   First MD Initiated Contact with Patient 08/16/21 [id number]     (approximate)  I have reviewed the triage vital signs and the nursing notes.   HISTORY  Chief Complaint Chest Pain    HPI Jose [name] is a 60 y.o. male with history of hypertension, hepatitis C, polysubstance abuse who presents to the emergency department with multiple complaints.  Patient is complaining of left-sided chest pain that feels like a tightness with associated shortness of breath for several days.  No aggravating or alleviating factors.  It is worse with palpation on my examination.  No fevers or cough.  He is also complained of generalized abdominal pain for several days.  Reports associated vomiting and diarrhea.  Denies previous abdominal surgeries.  No dysuria or hematuria.  Also complaining of pain to the left anterior shin with associated abrasion.  States that this occurred while trying to get into the ambulance to come here to the emergency department.  He thinks that he may have broken his leg but he is able to ambulate.  Unsure of his last tetanus vaccination.  States he is only had 1 beer today.  Denies drinking alcohol daily.        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   Substance induced mood disorder ([place]) 11/03/2015   Alcohol use disorder, severe, dependence ([place]) 10/24/2015   Alcohol withdrawal ([place]) 10/24/2015   Stimulant use disorder ([place]) (cocaine) 10/24/2015   Tobacco use  disorder 10/24/2015   Hepatitis C 09/06/2015    Past Surgical History:  Procedure Laterality Date   ESOPHAGOGASTRODUODENOSCOPY (EGD) WITH PROPOFOL N/A 03/02/2019   Procedure: ESOPHAGOGASTRODUODENOSCOPY (EGD) WITH PROPOFOL;  Surgeon: Georganna Skeans, MD;  Location: [facility];  Service: General;  Laterality: N/A;   IR GASTROSTOMY TUBE REMOVAL  05/04/2019   PEG PLACEMENT N/A 03/02/2019   Procedure: PERCUTANEOUS ENDOSCOPIC GASTROSTOMY (PEG) PLACEMENT;  Surgeon: Georganna Skeans, MD;  Location: [place];  Service: General;  Laterality: N/A;   PERCUTANEOUS TRACHEOSTOMY N/A 02/12/2019   Procedure: PERCUTANEOUS TRACHEOSTOMY;  Surgeon: Georganna Skeans, MD;  Location: [place];  Service: General;  Laterality: N/A;    Prior to Admission medications   Medication Sig Start Date End Date Taking? Authorizing Provider  bacitracin 500 UNIT/GM ointment Apply 1 application topically 2 (two) times daily. 08/16/21  Yes Keerthi Hazell, Delice Bison, DO  dicyclomine (BENTYL) 20 MG tablet Take 1 tablet (20 mg total) by mouth every 8 (eight) hours as needed for spasms (Abdominal cramping). 08/16/21  Yes Lailie Smead N, DO  ondansetron (ZOFRAN-ODT) 4 MG disintegrating tablet Take 1 tablet (4 mg total) by mouth every 6 (six) hours as needed for nausea or vomiting. 08/16/21  Yes Mariyanna Mucha, Delice Bison, DO  acetaminophen (TYLENOL) 160 MG/5ML solution Take 20.3 mLs (650 mg total) by mouth every 6 (six) hours as needed for fever. Patient not taking: Reported on 06/25/2021 03/18/19   Margie Billet A, PA-C  Amino Acids-Protein Hydrolys (FEEDING SUPPLEMENT, PRO-STAT SUGAR FREE 64,) LIQD Place 30 mLs into feeding tube 2 (two) times  daily. 03/18/19   Meuth, Jerene Pitch A, PA-C  amLODipine (NORVASC) 5 MG tablet Take 1 tablet (5 mg total) by mouth daily. Patient not taking: Reported on 06/25/2021 03/19/19   Margie Billet A, PA-C  cephALEXin (KEFLEX) 500 MG capsule Take 1 capsule (500 mg total) by mouth 3 (three) times daily. Patient not taking: Reported on  06/25/2021 09/19/18   Paulette Blanch, MD  chlorhexidine gluconate, MEDLINE KIT, (PERIDEX) 0.12 % solution 15 mLs by Mouth Rinse route 2 (two) times daily. Patient not taking: Reported on 06/25/2021 03/18/19   Margie Billet A, PA-C  clonazePAM (KLONOPIN) 1 MG disintegrating tablet Take 1 tablet (1 mg total) by mouth 2 (two) times daily as needed for seizure (and/or agitation). Patient not taking: Reported on 06/25/2021 03/18/19   Wellington Hampshire, PA-C  docusate (COLACE) 50 MG/5ML liquid Take 5 mLs (50 mg total) by mouth daily. Patient not taking: Reported on 06/25/2021 03/19/19   Wellington Hampshire, PA-C  folic acid (FOLVITE) 1 MG tablet Take 1 tablet (1 mg total) by mouth daily. Patient not taking: Reported on 06/25/2021 03/19/19   Margie Billet A, PA-C  guaiFENesin (ROBITUSSIN) 100 MG/5ML SOLN Place 15 mLs (300 mg total) into feeding tube every 6 (six) hours. Patient not taking: Reported on 06/25/2021 03/18/19   Wellington Hampshire, PA-C  hydrochlorothiazide (MICROZIDE) 12.5 MG capsule Take 2 capsules (25 mg total) by mouth daily. Patient not taking: Reported on 06/25/2021 03/19/19   Margie Billet A, PA-C  metoprolol tartrate (LOPRESSOR) 25 mg/10 mL SUSP Take 10 mLs (25 mg total) by mouth 2 (two) times daily. Patient not taking: Reported on 06/25/2021 03/18/19   Margie Billet A, PA-C  Multiple Vitamin (MULTIVITAMIN WITH MINERALS) TABS tablet Take 1 tablet by mouth daily. Patient not taking: Reported on 06/25/2021 03/19/19   Margie Billet A, PA-C  Nutritional Supplements (FEEDING SUPPLEMENT, OSMOLITE 1.5 CAL,) LIQD Place 474 mLs into feeding tube 3 (three) times daily. 03/18/19   Meuth, Brooke A, PA-C  ondansetron (ZOFRAN) 4 MG tablet Take 1 tablet (4 mg total) by mouth every 8 (eight) hours as needed for nausea or vomiting. Patient not taking: Reported on 06/25/2021 03/18/19   Margie Billet A, PA-C  oxyCODONE (OXY IR/ROXICODONE) 5 MG immediate release tablet Take 1 tablet (5 mg total) by mouth every 6 (six) hours as needed  for severe pain. Patient not taking: Reported on 06/25/2021 03/18/19   Margie Billet A, PA-C  pantoprazole (PROTONIX) 20 MG tablet Take 1 tablet (20 mg total) by mouth daily. Patient not taking: Reported on 06/25/2021 03/18/19   Margie Billet A, PA-C  polyethylene glycol (MIRALAX / GLYCOLAX) 17 g packet Take 17 g by mouth daily. Patient not taking: Reported on 06/25/2021 03/19/19   Margie Billet A, PA-C  QUEtiapine (SEROQUEL) 100 MG tablet Take 1 tablet (100 mg total) by mouth at bedtime. Patient not taking: Reported on 06/25/2021 03/18/19   Margie Billet A, PA-C  thiamine 100 MG tablet Take 1 tablet (100 mg total) by mouth daily. Patient not taking: Reported on 06/25/2021 03/19/19   Wellington Hampshire, PA-C    Allergies Patient has no known allergies.  No family history on file.  Social History Social History   Tobacco Use   Smoking status: Every Day    Packs/day: 0.50    Years: 45.00    Pack years: 22.50    Types: Cigarettes   Smokeless tobacco: Never  Vaping Use   Vaping Use: Never used  Substance Use Topics  Alcohol use: Yes    Comment: 12 pack per day   Drug use: Yes    Types: Cocaine, Marijuana    Comment: today    Review of Systems Constitutional: No fever. Eyes: No visual changes. ENT: No sore throat. Cardiovascular: + chest pain. Respiratory: + shortness of breath. Gastrointestinal: + nausea, vomiting, diarrhea. Genitourinary: Negative for dysuria. Musculoskeletal: Negative for back pain. Skin: Negative for rash. Neurological: Negative for focal weakness or numbness.  ____________________________________________   PHYSICAL EXAM:  VITAL SIGNS: ED Triage Vitals  Enc Vitals Group     BP 08/15/21 1908 (!) 140/92     Pulse Rate 08/15/21 1908 98     Resp 08/15/21 1908 16     Temp 08/15/21 1908 98.3 F (36.8 C)     Temp Source 08/15/21 1908 Oral     SpO2 08/15/21 1908 97 %     Weight 08/15/21 1903 150 lb (68 kg)     Height 08/15/21 1903 5' 8"  (1.727 m)     Head  Circumference --      Peak Flow --      Pain Score 08/15/21 1903 10     Pain Loc --      Pain Edu? --      Excl. in [place]? --    CONSTITUTIONAL: Alert and oriented and responds appropriately to questions.  Chronically ill-appearing HEAD: Normocephalic EYES: Conjunctivae clear, pupils appear equal, EOM appear intact ENT: normal nose; moist mucous membranes NECK: Supple, normal ROM CARD: RRR; S1 and S2 appreciated; no murmurs, no clicks, no rubs, no gallops CHEST:  Chest wall is tender to palpation over the anterior left chest wall which reproduces his pain.  No crepitus, ecchymosis, erythema, warmth, rash or other lesions present.   RESP: Normal chest excursion without splinting or tachypnea; breath sounds clear and equal bilaterally; no wheezes, no rhonchi, no rales, no hypoxia or respiratory distress, speaking full sentences ABD/GI: Normal bowel sounds; non-distended; soft, diffusely tender throughout the abdomen without guarding or rebound, no peritoneal signs BACK: The back appears normal EXT: Normal ROM in all joints; abrasion to the left anterior shin, no laceration that needs repair, no bony deformity, no ecchymosis or soft tissue swelling, no calf tenderness or calf swelling, extremities warm and well-perfused, no joint effusion, compartments soft SKIN: Normal color for age and race; warm; no rash on exposed skin NEURO: Moves all extremities equally PSYCH: The patient's mood and manner are appropriate.  ____________________________________________   LABS (all labs ordered are listed, but only abnormal results are displayed)  Labs Reviewed  CBC - Abnormal; Notable for the following components:      Result Value   Platelets 112 (*)    All other components within normal limits  LIPASE, BLOOD - Abnormal; Notable for the following components:   Lipase 57 (*)    All other components within normal limits  COMPREHENSIVE METABOLIC PANEL - Abnormal; Notable for the following components:    Sodium 134 (*)    Glucose, Bld 112 (*)    AST 108 (*)    ALT 69 (*)    All other components within normal limits  URINALYSIS, COMPLETE (UACMP) WITH MICROSCOPIC - Abnormal; Notable for the following components:   Color, Urine YELLOW (*)    APPearance CLEAR (*)    Hgb urine dipstick SMALL (*)    All other components within normal limits  ETHANOL - Abnormal; Notable for the following components:   Alcohol, Ethyl (B) 180 (*)    All  other components within normal limits  TROPONIN I (HIGH SENSITIVITY)  TROPONIN I (HIGH SENSITIVITY)   ____________________________________________  EKG   EKG Interpretation  Date/Time:  Tuesday August 15 2021 19:02:32 EST Ventricular Rate:  93 PR Interval:  152 QRS Duration: 140 QT Interval:  396 QTC Calculation: 492 R Axis:   73 Text Interpretation: Normal sinus rhythm Right bundle branch block Abnormal ECG No significant change since last tracing Confirmed by Pryor Curia [phone]) on 08/16/2021 1:34:08 AM        ____________________________________________  RADIOLOGY Jessie Foot Kortnee Bas, personally viewed and evaluated these images (plain radiographs) as part of my medical decision making, as well as reviewing the written report by the radiologist.  ED MD interpretation: X-ray showed no acute abnormality.  CT of the abdomen pelvis shows no acute abnormality.  Official radiology report(s): DG Chest 2 View  Result Date: 08/15/2021 CLINICAL DATA:  Chest pain. EXAM: CHEST - 2 VIEW COMPARISON:  Chest radiograph dated 06/24/2021. FINDINGS: No focal consolidation, pleural effusion, pneumothorax. The cardiac silhouette is within limits. Atherosclerotic calcification of the aorta. Osteopenia with degenerative changes of the spine. Old healed left rib fractures. No acute osseous pathology. IMPRESSION: No active cardiopulmonary disease. Electronically Signed   By: Anner Crete M.D.   On: 08/15/2021 19:29   DG Tibia/Fibula Left  Result Date:  08/16/2021 CLINICAL DATA:  Recent fall with left leg pain, initial encounter EXAM: LEFT TIBIA AND FIBULA - 2 VIEW COMPARISON:  None. FINDINGS: There is no evidence of fracture or other focal bone lesions. Soft tissues are unremarkable.Vascular calcifications are noted in the popliteal artery. IMPRESSION: No acute abnormality noted. Electronically Signed   By: Inez Catalina M.D.   On: 08/16/2021 02:15   CT ABDOMEN PELVIS W CONTRAST  Result Date: 08/16/2021 CLINICAL DATA:  Acute and nonlocalized abdominal pain. EXAM: CT ABDOMEN AND PELVIS WITH CONTRAST TECHNIQUE: Multidetector CT imaging of the abdomen and pelvis was performed using the standard protocol following bolus administration of intravenous contrast. CONTRAST:  130m OMNIPAQUE IOHEXOL 300 MG/ML  SOLN COMPARISON:  06/25/2021 FINDINGS: Lower chest:  Coronary atherosclerosis. Hepatobiliary: Hepatic steatosis.No evidence of biliary obstruction or stone. Pancreas: Unremarkable. Spleen: Unremarkable. Adrenals/Urinary Tract: Negative adrenals. No hydronephrosis or stone. Unremarkable bladder. Stomach/Bowel:  No obstruction. No visible bowel inflammation. Vascular/Lymphatic: No acute vascular abnormality. Extensive atheromatous plaque. No mass or adenopathy. Reproductive:No pathologic findings. Other: No ascites or pneumoperitoneum. Musculoskeletal: No acute abnormalities. Severe lumbar spine degeneration with mild scoliosis. Gas containing herniation at the right L4-5 foramen causing severe right foraminal impingement. IMPRESSION: 1. No acute finding. 2. Advanced atherosclerosis which includes the coronary arteries. 3. Hepatic steatosis Electronically Signed   By: JJorje GuildM.D.   On: 08/16/2021 04:58    ____________________________________________   PROCEDURES  Procedure(s) performed (including Critical Care):  Procedures   ____________________________________________   INITIAL IMPRESSION / ASSESSMENT AND PLAN / ED COURSE  As part of my  medical decision making, I reviewed the following data within the [facility] Centernotes reviewed and incorporated, Labs reviewed , EKG interpreted , Old EKG reviewed, Old chart reviewed, Radiograph reviewed , CT scan reviewed, and Notes from prior ED visits         Patient here with multiple complaints.  Chest pain seems atypical and likely musculoskeletal in nature.  Doubt ACS, PE or dissection.  Troponin x2 negative.  EKG shows no new ischemic change.  Chest x-ray reviewed by myself radiology shows no infiltrate, edema or pneumothorax.  Also complaining of diffuse  abdominal pain.  Lipase is slightly elevated and has a history of substance abuse.  Discussed with patient that this could be pancreatitis.  Will obtain CT of the abdomen pelvis for further evaluation.  Differential also includes colitis, diverticulitis, appendicitis, less likely bowel obstruction or perforation.  Urine also pending.  Patient also complaining of an injury to the left shin while getting in the ambulance to come to the ED.  We will clean this wound, apply bacitracin and nonadherent dressing.  We will update his tetanus vaccination.  He is concerned that he could have broken his leg.  I have very low suspicion for this but will obtain x-ray.  Neurovascularly intact distally.  No other signs of injury on exam.  ED PROGRESS  Patient CT of abdomen pelvis shows no acute abnormality.  Still complaining of pain after morphine.  Given no acute abnormality seen in his history of substance abuse, will give Toradol, Bentyl and p.o. challenge.  X-ray of the left shin shows no acute traumatic injury.  We will clean, dressed this area.   6:00 AM  Patient tolerated p.o. without difficulty.  Able to ambulate without difficulty.  Will discharge with prescriptions of Bentyl, Zofran and bacitracin.   At this time, I do not feel there is any life-threatening condition present. I have reviewed, interpreted and  discussed all results (EKG, imaging, lab, urine as appropriate) and exam findings with patient/family. I have reviewed nursing notes and appropriate previous records.  I feel the patient is safe to be discharged home without further emergent workup and can continue workup as an outpatient as needed. Discussed usual and customary return precautions. Patient/family verbalize understanding and are comfortable with this plan.  Outpatient follow-up has been provided as needed. All questions have been answered.  ____________________________________________   FINAL CLINICAL IMPRESSION(S) / ED DIAGNOSES  Final diagnoses:  Atypical chest pain  Generalized abdominal pain  Abrasion of left leg, initial encounter  Alcoholic intoxication without complication [facility])     ED Discharge Orders          Ordered    dicyclomine (BENTYL) 20 MG tablet  Every 8 hours PRN        08/16/21 0510    ondansetron (ZOFRAN-ODT) 4 MG disintegrating tablet  Every 6 hours PRN        08/16/21 0510    bacitracin 500 UNIT/GM ointment  2 times daily        08/16/21 0510            *Please note:  Pierce Barocio was evaluated in Emergency Department on 08/16/2021 for the symptoms described in the history of present illness. He was evaluated in the context of the global COVID-19 pandemic, which necessitated consideration that the patient might be at risk for infection with the SARS-CoV-2 virus that causes COVID-19. Institutional protocols and algorithms that pertain to the evaluation of patients at risk for COVID-19 are in a state of rapid change based on information released by regulatory bodies including the CDC and federal and state organizations. These policies and algorithms were followed during the patient's care in the ED.  Some ED evaluations and interventions may be delayed as a result of limited staffing during and the pandemic.*   Note:  This document was prepared using Dragon voice recognition software and  may include unintentional dictation errors.    Chisa Kushner, Delice Bison, DO 08/16/21 0600

## 2021-08-17 ENCOUNTER — Emergency Department
Admission: EM | Admit: 2021-08-17 | Discharge: 2021-08-17 | Disposition: A | Discharge status: Home / Self Care | Payer: Medicaid Other | Source: Home / Self Care | Attending: Emergency Medicine | Admitting: Emergency Medicine

## 2021-08-17 DIAGNOSIS — K529 Noninfective gastroenteritis and colitis, unspecified: Secondary | ICD-10-CM

## 2021-08-17 DIAGNOSIS — F1010 Alcohol abuse, uncomplicated: Secondary | ICD-10-CM

## 2021-08-17 DIAGNOSIS — Z760 Encounter for issue of repeat prescription: Secondary | ICD-10-CM

## 2021-08-17 DIAGNOSIS — R079 Chest pain, unspecified: Secondary | ICD-10-CM

## 2021-08-17 LAB — HEPATIC FUNCTION PANEL
ALT: 58 U/L — ABNORMAL HIGH (ref 0–44)
AST: 93 U/L — ABNORMAL HIGH (ref 15–41)
Albumin: 4.2 g/dL (ref 3.5–5.0)
Alkaline Phosphatase: 74 U/L (ref 38–126)
Bilirubin, Direct: 0.1 mg/dL (ref 0.0–0.2)
Indirect Bilirubin: 0.9 mg/dL (ref 0.3–0.9)
Total Bilirubin: 1 mg/dL (ref 0.3–1.2)
Total Protein: 8.3 g/dL — ABNORMAL HIGH (ref 6.5–8.1)

## 2021-08-17 LAB — LIPASE, BLOOD: Lipase: 46 U/L (ref 11–51)

## 2021-08-17 LAB — TROPONIN I (HIGH SENSITIVITY): Troponin I (High Sensitivity): 9 ng/L

## 2021-08-17 MED ORDER — HYDROCHLOROTHIAZIDE 12.5 MG PO CAPS
25.0000 mg | ORAL_CAPSULE | Freq: Every day | ORAL | Status: DC
Start: 2021-08-17 — End: 2021-10-07

## 2021-08-17 MED ORDER — AMLODIPINE BESYLATE 5 MG PO TABS
5.0000 mg | ORAL_TABLET | Freq: Every day | ORAL | Status: DC
  Administered 2021-08-17: 5 mg via ORAL
  Filled 2021-08-17: qty 1

## 2021-08-17 MED ORDER — ONDANSETRON 4 MG PO TBDP
4.0000 mg | ORAL_TABLET | Freq: Once | ORAL | Status: AC
  Administered 2021-08-17: 4 mg via ORAL
  Filled 2021-08-17: qty 1

## 2021-08-17 MED ORDER — DICYCLOMINE HCL 10 MG PO CAPS
10.0000 mg | ORAL_CAPSULE | Freq: Once | ORAL | Status: AC
  Administered 2021-08-17: 10 mg via ORAL
  Filled 2021-08-17: qty 1

## 2021-08-17 MED ORDER — THIAMINE HCL 100 MG PO TABS: 100.0000 mg | ORAL_TABLET | Freq: Every day | ORAL | Status: DC

## 2021-08-17 MED ORDER — ACETAMINOPHEN 500 MG PO TABS
1000.0000 mg | ORAL_TABLET | Freq: Once | ORAL | Status: AC
  Administered 2021-08-17: 1000 mg via ORAL
  Filled 2021-08-17: qty 2

## 2021-08-17 MED ORDER — METOPROLOL TARTRATE 25 MG/10 ML ORAL SUSPENSION
25.0000 mg | Freq: Two times a day (BID) | ORAL | Status: DC
  Filled 2021-08-17: qty 10

## 2021-08-17 MED ORDER — PANTOPRAZOLE SODIUM 20 MG PO TBEC
20.0000 mg | DELAYED_RELEASE_TABLET | Freq: Every day | ORAL | Status: DC
Start: 2021-08-17 — End: 2021-10-07

## 2021-08-17 MED ORDER — FOLIC ACID 1 MG PO TABS
1.0000 mg | ORAL_TABLET | Freq: Every day | ORAL | Status: DC
Start: 2021-08-17 — End: 2021-10-07

## 2021-08-17 MED ORDER — DICYCLOMINE HCL 20 MG PO TABS: 20.0000 mg | ORAL_TABLET | Freq: Three times a day (TID) | ORAL | 0 refills | Status: DC | PRN

## 2021-08-17 MED ORDER — ONDANSETRON 4 MG PO TBDP: 4.0000 mg | ORAL_TABLET | Freq: Four times a day (QID) | ORAL | 0 refills | Status: DC | PRN

## 2021-08-17 MED ORDER — AMLODIPINE BESYLATE 5 MG PO TABS: 5.0000 mg | ORAL_TABLET | Freq: Every day | ORAL | Status: DC

## 2021-08-17 MED ORDER — ADULT MULTIVITAMIN W/MINERALS CH: 1.0000 | ORAL_TABLET | Freq: Every day | ORAL | Status: DC

## 2021-08-17 NOTE — ED Notes (Signed)
Ambulated independently to the BR.  Gait steady.  Tolerated well.

## 2021-08-17 NOTE — ED Provider Notes (Signed)
[facility] Emergency Department Provider Note  ____________________________________________   Event Date/Time   First MD Initiated Contact with Patient 08/17/21 0740     (approximate)  I have reviewed the triage vital signs and the nursing notes.   HISTORY  Chief Complaint Leg Pain and Abdominal Pain   HPI Jose [name] is a 60 y.o. male with past medical history of HTN, hep C and polysubstance abuse who presents for assessment of ongoing chest discomfort, generalized crampy abdominal pain and nausea and vomiting as well as some soreness of his left lower leg after being discharged yesterday.  Patient states he lost his papers and was not able to get any of his prescriptions.  He states he had 1 beer after leaving but has not had any recurrent or interim falls.  He has no new shortness of breath, fevers, chest pain but states the chest tightness he felt before is the same and has not changed at all.  He has no new neck pain, back pain or any other acute extremity pain other than endorsing some persistent soreness in his left lower leg.  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   Substance induced mood disorder (HCC) 11/03/2015   Alcohol use disorder, severe, dependence (HCC) 10/24/2015   Alcohol withdrawal (HCC) 10/24/2015   Stimulant use disorder (HCC) (cocaine) 10/24/2015   Tobacco use disorder 10/24/2015   Hepatitis C 09/06/2015    Past Surgical History:  Procedure Laterality Date    ESOPHAGOGASTRODUODENOSCOPY (EGD) WITH PROPOFOL N/A 03/02/2019   Procedure: ESOPHAGOGASTRODUODENOSCOPY (EGD) WITH PROPOFOL;  Surgeon: Violeta Gelinas, MD;  Location: [facility] ENDOSCOPY;  Service: General;  Laterality: N/A;   IR GASTROSTOMY TUBE REMOVAL  05/04/2019   PEG PLACEMENT N/A 03/02/2019   Procedure: PERCUTANEOUS ENDOSCOPIC GASTROSTOMY (PEG) PLACEMENT;  Surgeon: Violeta Gelinas, MD;  Location: [facility] ENDOSCOPY;  Service: General;  Laterality: N/A;   PERCUTANEOUS TRACHEOSTOMY N/A 02/12/2019   Procedure: PERCUTANEOUS TRACHEOSTOMY;  Surgeon: Violeta Gelinas, MD;  Location: [facility] OR;  Service: General;  Laterality: N/A;    Prior to Admission medications   Medication Sig Start Date End Date Taking? Authorizing Provider  Amino Acids-Protein Hydrolys (FEEDING SUPPLEMENT, PRO-STAT SUGAR FREE 64,) LIQD Place 30 mLs into feeding tube 2 (two) times daily. 03/18/19   Meuth, Brooke A, PA-C  amLODipine (NORVASC) 5 MG tablet Take 1 tablet (5 mg total) by mouth daily. 08/17/21 09/16/21  Gilles Chiquito, MD  bacitracin 500 UNIT/GM ointment Apply 1 application topically 2 (two) times daily. 08/16/21   Ward, Layla Maw, DO  clonazePAM (KLONOPIN) 1 MG disintegrating tablet Take 1 tablet (1 mg total) by mouth 2 (two) times daily as needed for seizure (and/or agitation). Patient not taking: Reported on 06/25/2021 03/18/19   Carlena Bjornstad A, PA-C  dicyclomine (BENTYL) 20 MG tablet Take 1 tablet (20 mg total) by mouth every 8 (eight) hours as needed for spasms (Abdominal cramping). 08/17/21   Gilles Chiquito, MD  folic acid (FOLVITE) 1 MG  tablet Take 1 tablet (1 mg total) by mouth daily. 08/17/21   Gilles Chiquito, MD  hydrochlorothiazide (MICROZIDE) 12.5 MG capsule Take 2 capsules (25 mg total) by mouth daily. 08/17/21   Gilles Chiquito, MD  metoprolol tartrate (LOPRESSOR) 25 mg/10 mL SUSP Take 10 mLs (25 mg total) by mouth 2 (two) times daily. Patient not taking: Reported on 06/25/2021 03/18/19   Carlena Bjornstad A, PA-C  Multiple  Vitamin (MULTIVITAMIN WITH MINERALS) TABS tablet Take 1 tablet by mouth daily. 08/17/21   Gilles Chiquito, MD  Nutritional Supplements (FEEDING SUPPLEMENT, OSMOLITE 1.5 CAL,) LIQD Place 474 mLs into feeding tube 3 (three) times daily. 03/18/19   Meuth, Brooke A, PA-C  ondansetron (ZOFRAN-ODT) 4 MG disintegrating tablet Take 1 tablet (4 mg total) by mouth every 6 (six) hours as needed for nausea or vomiting. 08/17/21   Gilles Chiquito, MD  pantoprazole (PROTONIX) 20 MG tablet Take 1 tablet (20 mg total) by mouth daily. 08/17/21   Gilles Chiquito, MD  QUEtiapine (SEROQUEL) 100 MG tablet Take 1 tablet (100 mg total) by mouth at bedtime. Patient not taking: Reported on 06/25/2021 03/18/19   Carlena Bjornstad A, PA-C  thiamine 100 MG tablet Take 1 tablet (100 mg total) by mouth daily. 08/17/21   Gilles Chiquito, MD    Allergies Patient has no known allergies.  History reviewed. No pertinent family history.  Social History Social History   Tobacco Use   Smoking status: Every Day    Packs/day: 0.50    Years: 45.00    Pack years: 22.50    Types: Cigarettes   Smokeless tobacco: Never  Vaping Use   Vaping Use: Never used  Substance Use Topics   Alcohol use: Yes    Comment: 12 pack per day   Drug use: Yes    Types: Cocaine, Marijuana    Comment: today    Review of Systems  Review of Systems  Constitutional:  Negative for chills and fever.  HENT:  Negative for sore throat.   Eyes:  Negative for pain.  Respiratory:  Negative for cough and stridor.   Cardiovascular:  Positive for chest pain.  Gastrointestinal:  Positive for abdominal pain, nausea and vomiting.  Musculoskeletal:  Positive for myalgias (anterior L lower leg).  Skin:  Negative for rash.  Neurological:  Negative for seizures, loss of consciousness and headaches.  Psychiatric/Behavioral:  Positive for substance abuse.   All other systems reviewed and are negative.     ____________________________________________   PHYSICAL EXAM:  VITAL SIGNS: ED Triage Vitals  Enc Vitals Group     BP 08/16/21 2113 (!) 150/89     Pulse Rate 08/16/21 2113 93     Resp 08/16/21 2113 18     Temp 08/16/21 2113 98.5 F (36.9 C)     Temp Source 08/16/21 2113 Oral     SpO2 08/16/21 2113 96 %     Weight 08/16/21 2112 150 lb (68 kg)     Height 08/16/21 2112  (1.727 m)     Head Circumference --      Peak Flow --      Pain Score 08/16/21 2111 10     Pain Loc --      Pain Edu? --      Excl. in GC? --    Vitals:   08/17/21 0443 08/17/21 0806  BP: (!) 174/99 (!) 164/97  Pulse: 98 88  Resp: 20 16  Temp: 98.2 F (36.8 C)  SpO2: 98% 98%   Physical Exam Vitals and nursing note reviewed.  Constitutional:      General: He is not in acute distress.    Appearance: He is well-developed.  HENT:     Head: Normocephalic and atraumatic.  Eyes:     Conjunctiva/sclera: Conjunctivae normal.  Cardiovascular:     Rate and Rhythm: Normal rate and regular rhythm.     Heart sounds: No murmur heard. Pulmonary:     Effort: Pulmonary effort is normal. No respiratory distress.     Breath sounds: Normal breath sounds.  Abdominal:     Palpations: Abdomen is soft.     Tenderness: There is generalized abdominal tenderness.  Musculoskeletal:        General: No swelling.     Cervical back: Neck supple.  Skin:    General: Skin is warm and dry.     Capillary Refill: Capillary refill takes less than 2 seconds.  Neurological:     Mental Status: He is alert.  Psychiatric:        Mood and Affect: Mood normal.    Cranial nerves II through XII are grossly intact.  There is no tenderness step-offs or deformities over the C/T/L-spine.  There is a small skin tear and abrasion over the anterior left lower leg midway between the knee and ankle.  Patient otherwise has full strength at the bilateral hips, knees and ankles.  2+ radial and DP pulses.  Sensation is intact light touch all  extremities.  Very mild tenderness throughout the abdomen which is no deep tenderness peritonitis or guarding.  Lungs are clear bilaterally.  Chest has some mild reproducible tenderness specifically over the left costochondral joints. ____________________________________________   LABS (all labs ordered are listed, but only abnormal results are displayed)  Labs Reviewed  BASIC METABOLIC PANEL - Abnormal; Notable for the following components:      Result Value   Sodium 130 (*)    Chloride 96 (*)    All other components within normal limits  CBC - Abnormal; Notable for the following components:   RBC 4.01 (*)    Platelets 100 (*)    All other components within normal limits  HEPATIC FUNCTION PANEL - Abnormal; Notable for the following components:   Total Protein 8.3 (*)    AST 93 (*)    ALT 58 (*)    All other components within normal limits  LIPASE, BLOOD  TROPONIN I (HIGH SENSITIVITY)  TROPONIN I (HIGH SENSITIVITY)   ____________________________________________  EKG  ECG shows sinus tachycardia with a ventricular rate of 104, right bundle branch block and some nonspecific ST changes in anterior lateral leads without other clear evidence of acute ischemia or significant arrhythmia ____________________________________________  RADIOLOGY  ED MD interpretation: This x-ray shows old chronic pain left-sided rib fractures without any acute fractures, pneumothorax, focal consolidation, effusion, edema or other clear acute thoracic process.  Official radiology report(s): DG Chest 2 View  Result Date: 08/16/2021 CLINICAL DATA:  Chest pain EXAM: CHEST - 2 VIEW COMPARISON:  08/15/2021 FINDINGS: The heart size and mediastinal contours are within normal limits. Both lungs are clear. Old appearing left-sided rib fractures. IMPRESSION: No active cardiopulmonary disease. Electronically Signed   By: Jasmine Pang M.D.   On: 08/16/2021 23:41     ____________________________________________   PROCEDURES  Procedure(s) performed (including Critical Care):  Procedures   ____________________________________________   INITIAL IMPRESSION / ASSESSMENT AND PLAN / ED COURSE      Patient presents with above-stated history  exam for some persistent nausea vomiting chest tightness and generalized crampy abdominal pain and rash in his left lower leg ongoing since he was last discharged following evaluation in the ED yesterday.  Seems he had scraped his leg from a mechanical ground-level fall at that time and has been able to ambulate without difficulty.  He has no apparent new symptoms on arrival back to the emergency room.  On arrival he is hypertensive with otherwise stable vital signs.  He does have an abrasion over his left lower leg which appears clean without evidence of infection or surrounding skin changes.  He is otherwise neurovascular intact without other evidence of significant trauma on exam.  I was able to review patient's work-up from yesterday that included a CT of abdomen pelvis as well as a plain film left tibia.  CT was remarkable for no acute findings other than some atherosclerotic disease and hepatic steatosis.  Specifically no evidence of appendicitis, pancreatitis, diverticulitis, kidney stone, perinephric stranding, or other acute process.  Plan for left tib-fib showed no underlying fracture dislocation at the site of patient's abrasion.  I suspect symptoms of generalized crampy abdominal pain and nausea and vomiting given reassuring CT most likely related to acute infectious gastroenteritis possibly exacerbated by ongoing alcohol abuse.  With guard patient's chest discomfort is denying any shortness of breath and has no hypoxia tachypnea or tachycardia to suggest a PE at this time.  ECG shows sinus tachycardia with a ventricular rate of 104, right bundle branch block and some nonspecific ST changes in anterior  lateral leads without other clear evidence of acute ischemia or significant arrhythmia.  However given nonelevated troponin x2 Evalose patient for ACS or myocarditis.  Chest x-ray shows old chronic pain left-sided rib fractures without any acute fractures, pneumothorax, focal consolidation, effusion, edema or other clear acute thoracic process.  CBC shows no leukocytosis or acute anemia.  BMP shows no significant electrolyte metabolic derangements aside from some mild hyponatremia with NA of 130.  Suspect secondary to alcohol abuse.  UA has some small hemoglobin but otherwise no evidence of infection given absence of stone seen on CT imaging in the last 24 hours a low suspicion for significant stone at this time.    Lipase not consistent with acute pancreatitis.  Hepatic function panel shows stable mildly elevated transaminitis without evidence of significant acute hepatitis or cholestatic process.  Impression is likely some gastroenteritis possibly infectious versus related to ongoing alcohol abuse and abrasion from ground-level fall on patient's left lower leg.  His chest discomfort could certainly relate this off esophagitis versus costochondritis although Evalose patient further medial after an pathology at this time.  Patient is not ready to completely discontinue alcohol at this time and I do not believe a Librium taper is appropriate.  He does not appear intoxicated or in acute withdrawal with no tremulousness tachycardia although he is mildly hypertensive.  He notes he has been out of his blood pressure medicines for several years.  We will give refills of these provided Rx for Zofran and Bentyl.  Discharged stable condition.  Strict return cautions advised and discussed.     ____________________________________________   FINAL CLINICAL IMPRESSION(S) / ED DIAGNOSES  Final diagnoses:  Chest pain, unspecified type  Gastroenteritis  Alcohol abuse  Medication refill    Medications   amLODipine (NORVASC) tablet 5 mg (5 mg Oral Given 08/17/21 0812)  ondansetron (ZOFRAN-ODT) disintegrating tablet 4 mg (4 mg Oral Given 08/17/21 0812)  dicyclomine (BENTYL) capsule 10 mg (  10 mg Oral Given 08/17/21 0812)  acetaminophen (TYLENOL) tablet 1,000 mg (1,000 mg Oral Given 08/17/21 0812)     ED Discharge Orders          Ordered    amLODipine (NORVASC) 5 MG tablet  Daily        08/17/21 0939    dicyclomine (BENTYL) 20 MG tablet  Every 8 hours PRN        08/17/21 0939    thiamine 100 MG tablet  Daily        08/17/21 0939    pantoprazole (PROTONIX) 20 MG tablet  Daily        08/17/21 0939    ondansetron (ZOFRAN-ODT) 4 MG disintegrating tablet  Every 6 hours PRN        08/17/21 0939    Multiple Vitamin (MULTIVITAMIN WITH MINERALS) TABS tablet  Daily        08/17/21 0939    hydrochlorothiazide (MICROZIDE) 12.5 MG capsule  Daily        08/17/21 0939    folic acid (FOLVITE) 1 MG tablet  Daily        08/17/21 8372             Note:  This document was prepared using Dragon voice recognition software and may include unintentional dictation errors.    Gilles Chiquito, MD 08/17/21 [phone]

## 2021-09-09 ENCOUNTER — Other Ambulatory Visit: Payer: Self-pay

## 2021-09-09 ENCOUNTER — Emergency Department: Payer: Medicaid Other

## 2021-09-09 DIAGNOSIS — R0789 Other chest pain: Secondary | ICD-10-CM | POA: Insufficient documentation

## 2021-09-09 DIAGNOSIS — F17210 Nicotine dependence, cigarettes, uncomplicated: Secondary | ICD-10-CM | POA: Insufficient documentation

## 2021-09-09 DIAGNOSIS — Z79899 Other long term (current) drug therapy: Secondary | ICD-10-CM | POA: Diagnosis not present

## 2021-09-09 DIAGNOSIS — R1084 Generalized abdominal pain: Secondary | ICD-10-CM | POA: Insufficient documentation

## 2021-09-09 DIAGNOSIS — M545 Low back pain, unspecified: Secondary | ICD-10-CM | POA: Insufficient documentation

## 2021-09-09 DIAGNOSIS — I10 Essential (primary) hypertension: Secondary | ICD-10-CM | POA: Diagnosis not present

## 2021-09-09 DIAGNOSIS — R1110 Vomiting, unspecified: Secondary | ICD-10-CM | POA: Diagnosis not present

## 2021-09-09 DIAGNOSIS — S39012A Strain of muscle, fascia and tendon of lower back, initial encounter: Secondary | ICD-10-CM | POA: Diagnosis not present

## 2021-09-09 LAB — BASIC METABOLIC PANEL
Anion gap: 11 (ref 5–15)
BUN: 8 mg/dL (ref 6–20)
CO2: 23 mmol/L (ref 22–32)
Calcium: 9.1 mg/dL (ref 8.9–10.3)
Chloride: 97 mmol/L — ABNORMAL LOW (ref 98–111)
Creatinine, Ser: 0.52 mg/dL — ABNORMAL LOW (ref 0.61–1.24)
GFR, Estimated: >60 mL/min (ref >60)
Glucose, Bld: 99 mg/dL (ref 70–99)
Potassium: 3.4 mmol/L — ABNORMAL LOW (ref 3.5–5.1)
Sodium: 131 mmol/L — ABNORMAL LOW (ref 135–145)

## 2021-09-09 LAB — CBC
HCT: 39.2 % (ref 39.0–52.0)
Hemoglobin: 13.5 g/dL (ref 13.0–17.0)
MCH: 33.4 pg (ref 26.0–34.0)
MCHC: 34.4 g/dL (ref 30.0–36.0)
MCV: 97 fL (ref 80.0–100.0)
Platelets: 98 10*3/uL — ABNORMAL LOW (ref 150–400)
RBC: 4.04 MIL/uL — ABNORMAL LOW (ref 4.22–5.81)
RDW: 12.6 % (ref 11.5–15.5)
WBC: 5.4 10*3/uL (ref 4.0–10.5)
nRBC: 0 % (ref 0.0–0.2)

## 2021-09-09 LAB — LIPASE, BLOOD: Lipase: 50 U/L (ref 11–51)

## 2021-09-09 LAB — TROPONIN I (HIGH SENSITIVITY): Troponin I (High Sensitivity): 10 ng/L (ref <18)

## 2021-09-09 NOTE — ED Triage Notes (Signed)
Pt arrived via [facility] with reports of CP and abd pain. Pt was picked up at [facility], vomited x 3 prior to EMS  PT given 324 ASA with EMS  18G L FA  155/95 96% RA, p-85 99.0 CBG 156

## 2021-09-09 NOTE — ED Triage Notes (Signed)
Pt presents to ER c/o chest pain, abd pain, nausea and dizziness for around a week but today has become significantly worse.  Pt states he has vomited several times.  Chest pain radiates to left shoulder and into jaw.  Pt A&O x4 at this time.  NAD at this time.

## 2021-09-10 ENCOUNTER — Emergency Department
Admission: EM | Admit: 2021-09-10 | Discharge: 2021-09-10 | Disposition: A | Discharge status: Home / Self Care | Payer: Medicaid Other | Source: Home / Self Care | Attending: Emergency Medicine | Admitting: Emergency Medicine

## 2021-09-10 ENCOUNTER — Emergency Department
Admission: EM | Admit: 2021-09-10 | Discharge: 2021-09-10 | Disposition: A | Discharge status: Home / Self Care | Payer: Medicaid Other | Attending: Emergency Medicine | Admitting: Emergency Medicine

## 2021-09-10 ENCOUNTER — Other Ambulatory Visit: Payer: Self-pay

## 2021-09-10 DIAGNOSIS — W010XXA Fall on same level from slipping, tripping and stumbling without subsequent striking against object, initial encounter: Secondary | ICD-10-CM | POA: Insufficient documentation

## 2021-09-10 DIAGNOSIS — M545 Low back pain, unspecified: Secondary | ICD-10-CM | POA: Insufficient documentation

## 2021-09-10 DIAGNOSIS — R0789 Other chest pain: Secondary | ICD-10-CM

## 2021-09-10 DIAGNOSIS — I10 Essential (primary) hypertension: Secondary | ICD-10-CM | POA: Insufficient documentation

## 2021-09-10 DIAGNOSIS — F17210 Nicotine dependence, cigarettes, uncomplicated: Secondary | ICD-10-CM | POA: Insufficient documentation

## 2021-09-10 DIAGNOSIS — Z79899 Other long term (current) drug therapy: Secondary | ICD-10-CM | POA: Insufficient documentation

## 2021-09-10 DIAGNOSIS — Y9301 Activity, walking, marching and hiking: Secondary | ICD-10-CM | POA: Insufficient documentation

## 2021-09-10 DIAGNOSIS — R1084 Generalized abdominal pain: Secondary | ICD-10-CM

## 2021-09-10 LAB — HEPATIC FUNCTION PANEL
ALT: 60 U/L — ABNORMAL HIGH (ref 0–44)
AST: 94 U/L — ABNORMAL HIGH (ref 15–41)
Albumin: 3.9 g/dL (ref 3.5–5.0)
Alkaline Phosphatase: 69 U/L (ref 38–126)
Bilirubin, Direct: 0.1 mg/dL (ref 0.0–0.2)
Indirect Bilirubin: 0.8 mg/dL (ref 0.3–0.9)
Total Bilirubin: 0.9 mg/dL (ref 0.3–1.2)
Total Protein: 7.9 g/dL (ref 6.5–8.1)

## 2021-09-10 LAB — TROPONIN I (HIGH SENSITIVITY): Troponin I (High Sensitivity): 10 ng/L (ref <18)

## 2021-09-10 MED ORDER — KETOROLAC TROMETHAMINE 60 MG/2ML IM SOLN
60.0000 mg | Freq: Once | INTRAMUSCULAR | Status: AC
  Administered 2021-09-10: 23:00:00 60 mg via INTRAMUSCULAR
  Filled 2021-09-10: qty 2

## 2021-09-10 MED ORDER — ACETAMINOPHEN 325 MG PO TABS
650.0000 mg | ORAL_TABLET | Freq: Once | ORAL | Status: AC
  Administered 2021-09-10: 23:00:00 650 mg via ORAL
  Filled 2021-09-10: qty 2

## 2021-09-10 MED ORDER — DEXAMETHASONE SODIUM PHOSPHATE 10 MG/ML IJ SOLN
10.0000 mg | Freq: Once | INTRAMUSCULAR | Status: AC
  Administered 2021-09-10: 23:00:00 10 mg via INTRAMUSCULAR
  Filled 2021-09-10: qty 1

## 2021-09-10 MED ORDER — HYDROCODONE-ACETAMINOPHEN 5-325 MG PO TABS: 2.0000 | ORAL_TABLET | Freq: Once | ORAL | Status: DC

## 2021-09-10 MED ORDER — ONDANSETRON 4 MG PO TBDP
4.0000 mg | ORAL_TABLET | Freq: Once | ORAL | Status: AC
  Administered 2021-09-10: 03:00:00 4 mg via ORAL
  Filled 2021-09-10: qty 1

## 2021-09-10 MED ORDER — METHOCARBAMOL 500 MG PO TABS: 500.0000 mg | ORAL_TABLET | Freq: Three times a day (TID) | ORAL | 0 refills | Status: AC | PRN

## 2021-09-10 MED ORDER — OXYCODONE-ACETAMINOPHEN 5-325 MG PO TABS
1.0000 | ORAL_TABLET | Freq: Once | ORAL | Status: AC
  Administered 2021-09-10: 03:00:00 1 via ORAL
  Filled 2021-09-10: qty 1

## 2021-09-10 MED ORDER — IBUPROFEN 600 MG PO TABS: 600.0000 mg | ORAL_TABLET | Freq: Three times a day (TID) | ORAL | 0 refills | Status: DC | PRN

## 2021-09-10 MED ORDER — CHLORDIAZEPOXIDE HCL 25 MG PO CAPS
50.0000 mg | ORAL_CAPSULE | Freq: Once | ORAL | Status: AC
  Administered 2021-09-10: 23:00:00 50 mg via ORAL
  Filled 2021-09-10: qty 2

## 2021-09-10 NOTE — Discharge Instructions (Signed)
Take the meds as prescribed  Follow-up with a primary doctor in 1-2 weeks if able. You can call one of the numbers above to set up an appointment at a low-cost clinic locally.  Try to minimize heavy lifting or twisting.

## 2021-09-10 NOTE — ED Notes (Signed)
Pt given dc ppw, 2 cans of soda and ice. Pt assisted to WR via wheelchair.

## 2021-09-10 NOTE — ED Triage Notes (Signed)
FIRST NURSE NOTE:  Pt ambulatory, no distress noted, here with multiple medical complaints. Pt is homeless, was seen here last night for similar complaints, pt stayed in lobby after DC until the morning to walk somewhere.

## 2021-09-10 NOTE — ED Triage Notes (Signed)
Pt states that he twisted his back walking down the road earlier today. Pt ambulatory to triage room. Pt c/o lower back pain.

## 2021-09-10 NOTE — ED Provider Notes (Signed)
[facility] Emergency Department Provider Note  ____________________________________________   Event Date/Time   First MD Initiated Contact with Patient 09/10/21 2201     (approximate)  I have reviewed the triage vital signs and the nursing notes.   HISTORY  Chief Complaint Back Pain    HPI Jose [name] is a 60 y.o. male  with h/o hep C, EtOH dependence, HTN, here with back pain. Pt reports that he "tripped" while walking today and "tweaked" his back, causing him to fall. He reports that he experienced acute onset of sharp, stabbing, lower back pain. Since then, the pain has persisted and is 10/10 in severity. Shoots to his buttocks but not down his legs. No weakness or numbness. No loss of bowel or bladder function. Pt reports he is also having chest/abd pain, though this was worked up last night without acute abnormality. Pain is worse w/ movement, palpation. No alleviating factors.       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/14/2015   Substance induced mood disorder (HCC) 11/03/2015   Alcohol use disorder, severe, dependence (HCC) 10/24/2015   Alcohol withdrawal (HCC) 10/24/2015   Stimulant use disorder (HCC) (cocaine) 10/24/2015   Tobacco use disorder 10/24/2015   Hepatitis C 09/06/2015    Past Surgical History:  Procedure Laterality Date   ESOPHAGOGASTRODUODENOSCOPY (EGD) WITH PROPOFOL N/A 03/02/2019   Procedure: ESOPHAGOGASTRODUODENOSCOPY (EGD) WITH PROPOFOL;  Surgeon: Violeta Gelinas, MD;  Location: [facility] ENDOSCOPY;  Service: General;  Laterality: N/A;   IR GASTROSTOMY  TUBE REMOVAL  05/04/2019   PEG PLACEMENT N/A 03/02/2019   Procedure: PERCUTANEOUS ENDOSCOPIC GASTROSTOMY (PEG) PLACEMENT;  Surgeon: Violeta Gelinas, MD;  Location: [facility] ENDOSCOPY;  Service: General;  Laterality: N/A;   PERCUTANEOUS TRACHEOSTOMY N/A 02/12/2019   Procedure: PERCUTANEOUS TRACHEOSTOMY;  Surgeon: Violeta Gelinas, MD;  Location: [facility] OR;  Service: General;  Laterality: N/A;    Prior to Admission medications   Medication Sig Start Date End Date Taking? Authorizing Provider  ibuprofen (ADVIL) 600 MG tablet Take 1 tablet (600 mg total) by mouth every 8 (eight) hours as needed for moderate pain. 09/10/21  Yes Shaune Pollack, MD  methocarbamol (ROBAXIN) 500 MG tablet Take 1 tablet (500 mg total) by mouth every 8 (eight) hours as needed for up to 7 days for muscle spasms. 09/10/21 09/17/21 Yes Shaune Pollack, MD  Amino Acids-Protein Hydrolys (FEEDING SUPPLEMENT, PRO-STAT SUGAR FREE 64,) LIQD Place 30 mLs into feeding tube 2 (two) times daily. 03/18/19   Meuth, Brooke A, PA-C  amLODipine (NORVASC) 5 MG tablet Take 1 tablet (5 mg total) by mouth daily. 08/17/21 09/16/21  Gilles Chiquito, MD  bacitracin 500 UNIT/GM ointment Apply 1 application topically 2 (two) times daily. 08/16/21   Ward, Layla Maw, DO  clonazePAM (KLONOPIN) 1 MG disintegrating tablet Take 1 tablet (1 mg total) by mouth 2 (two) times daily as needed for seizure (and/or agitation). Patient not taking: Reported on 06/25/2021 03/18/19   Carlena Bjornstad A, PA-C  dicyclomine (BENTYL) 20 MG tablet Take 1 tablet (20 mg total) by mouth every 8 (eight) hours as needed for spasms (Abdominal cramping).  08/17/21   Gilles Chiquito, MD  folic acid (FOLVITE) 1 MG tablet Take 1 tablet (1 mg total) by mouth daily. 08/17/21   Gilles Chiquito, MD  hydrochlorothiazide (MICROZIDE) 12.5 MG capsule Take 2 capsules (25 mg total) by mouth daily. 08/17/21   Gilles Chiquito, MD  metoprolol tartrate (LOPRESSOR) 25 mg/10 mL SUSP Take 10 mLs (25 mg total) by  mouth 2 (two) times daily. Patient not taking: Reported on 06/25/2021 03/18/19   Carlena Bjornstad A, PA-C  Multiple Vitamin (MULTIVITAMIN WITH MINERALS) TABS tablet Take 1 tablet by mouth daily. 08/17/21   Gilles Chiquito, MD  Nutritional Supplements (FEEDING SUPPLEMENT, OSMOLITE 1.5 CAL,) LIQD Place 474 mLs into feeding tube 3 (three) times daily. 03/18/19   Meuth, Brooke A, PA-C  ondansetron (ZOFRAN-ODT) 4 MG disintegrating tablet Take 1 tablet (4 mg total) by mouth every 6 (six) hours as needed for nausea or vomiting. 08/17/21   Gilles Chiquito, MD  pantoprazole (PROTONIX) 20 MG tablet Take 1 tablet (20 mg total) by mouth daily. 08/17/21   Gilles Chiquito, MD  QUEtiapine (SEROQUEL) 100 MG tablet Take 1 tablet (100 mg total) by mouth at bedtime. Patient not taking: Reported on 06/25/2021 03/18/19   Carlena Bjornstad A, PA-C  thiamine 100 MG tablet Take 1 tablet (100 mg total) by mouth daily. 08/17/21   Gilles Chiquito, MD    Allergies Patient has no known allergies.  No family history on file.  Social History Social History   Tobacco Use   Smoking status: Every Day    Packs/day: 0.50    Years: 45.00    Pack years: 22.50    Types: Cigarettes   Smokeless tobacco: Never  Vaping Use   Vaping Use: Never used  Substance Use Topics   Alcohol use: Yes    Comment: 12 pack per day   Drug use: Yes    Types: Cocaine, Marijuana    Comment: today    Review of Systems  Review of Systems  Constitutional:  Negative for chills, fatigue and fever.  HENT:  Negative for sore throat.   Respiratory:  Negative for shortness of breath.   Cardiovascular:  Negative for chest pain.  Gastrointestinal:  Negative for abdominal pain.  Genitourinary:  Negative for flank pain.  Musculoskeletal:  Positive for arthralgias and back pain. Negative for neck pain.  Skin:  Negative for rash and wound.  Allergic/Immunologic: Negative for immunocompromised state.  Neurological:  Negative for weakness and numbness.   Hematological:  Does not bruise/bleed easily.  All other systems reviewed and are negative.   ____________________________________________  PHYSICAL EXAM:      VITAL SIGNS: ED Triage Vitals  Enc Vitals Group     BP 09/10/21 2037 (!) 178/99     Pulse Rate 09/10/21 2037 93     Resp 09/10/21 2037 18     Temp 09/10/21 2037 98.3 F (36.8 C)     Temp Source 09/10/21 2037 Oral     SpO2 09/10/21 2037 98 %     Weight 09/10/21 2035 145 lb (65.8 kg)     Height 09/10/21 2035 5\' 8"  (1.727 m)     Head Circumference --      Peak Flow --      Pain Score 09/10/21 2035 10     Pain Loc --      Pain Edu? --      Excl. in GC? --      Physical Exam Vitals and nursing note  reviewed.  Constitutional:      General: He is not in acute distress.    Appearance: He is well-developed.  HENT:     Head: Normocephalic and atraumatic.  Eyes:     Conjunctiva/sclera: Conjunctivae normal.  Cardiovascular:     Rate and Rhythm: Normal rate and regular rhythm.     Heart sounds: Normal heart sounds. No murmur heard.   No friction rub.     Comments: DP pulses 2+ and symmetric. Pulmonary:     Effort: Pulmonary effort is normal. No respiratory distress.     Breath sounds: Normal breath sounds. No wheezing or rales.  Abdominal:     General: There is no distension.     Palpations: Abdomen is soft.     Tenderness: There is no abdominal tenderness.  Musculoskeletal:     Cervical back: Neck supple.     Comments: Moderate paraspinal TTP over lower lumbar spine, no deformity.   Skin:    General: Skin is warm.     Capillary Refill: Capillary refill takes less than 2 seconds.  Neurological:     Mental Status: He is alert and oriented to person, place, and time.     Motor: No abnormal muscle tone.     Comments: Strength 5/5 bilateral LE, with encouragement. Sensation to light touch intact b/l LE. Quad reflexes 2+ and symmetric.      ____________________________________________   LABS (all labs ordered  are listed, but only abnormal results are displayed)  Labs Reviewed - No data to display  ____________________________________________  EKG:  ________________________________________  RADIOLOGY All imaging, including plain films, CT scans, and ultrasounds, independently reviewed by me, and interpretations confirmed via formal radiology reads.  ED MD interpretation:   None  Official radiology report(s): No results found.  ____________________________________________  PROCEDURES   Procedure(s) performed (including Critical Care):  Procedures  ____________________________________________  INITIAL IMPRESSION / MDM / ASSESSMENT AND PLAN / ED COURSE  As part of my medical decision making, I reviewed the following data within the electronic MEDICAL RECORD NUMBER Nursing notes reviewed and incorporated, Old chart reviewed, Notes from prior ED visits, and [place] Controlled Substance Database       *Jose [name] was evaluated in Emergency Department on 09/10/2021 for the symptoms described in the history of present illness. He was evaluated in the context of the global COVID-19 pandemic, which necessitated consideration that the patient might be at risk for infection with the SARS-CoV-2 virus that causes COVID-19. Institutional protocols and algorithms that pertain to the evaluation of patients at risk for COVID-19 are in a state of rapid change based on information released by regulatory bodies including the CDC and federal and state organizations. These policies and algorithms were followed during the patient's care in the ED.  Some ED evaluations and interventions may be delayed as a result of limited staffing during the pandemic.*     Medical Decision Making:  60 yo F here with back pain after tripping and catching himself. I reviewed prior CTs, which show degen changes. Suspect back strain, less likely radiculopathy given b/l nature of pain. No LE weakness, numbness, loss of bowel  or bladder function or signs of cauda equina or red flags. Pt states he wants to stay in ED b/c he is homeless and his back hurts. Will allow him to stay in [facility] but no apparent emergent pathology needing further evaluation at this time. His chest/abd pain is chronic, well documented, and he just had recent w/u within 24  hours for this. Pain, history is not c/f dissection. Pulses 2+ and symmetric. Librium given since pt will be in [facility] overnight, and has h/o EtOH dependence. No signs of w/d at this time.  ____________________________________________  FINAL CLINICAL IMPRESSION(S) / ED DIAGNOSES  Final diagnoses:  Acute bilateral low back pain without sciatica     MEDICATIONS GIVEN DURING THIS VISIT:  Medications  ketorolac (TORADOL) injection 60 mg (60 mg Intramuscular Given 09/10/21 2244)  chlordiazePOXIDE (LIBRIUM) capsule 50 mg (50 mg Oral Given 09/10/21 2244)  acetaminophen (TYLENOL) tablet 650 mg (650 mg Oral Given 09/10/21 2244)  dexamethasone (DECADRON) injection 10 mg (10 mg Intramuscular Given 09/10/21 2244)     ED Discharge Orders          Ordered    ibuprofen (ADVIL) 600 MG tablet  Every 8 hours PRN        09/10/21 2231    methocarbamol (ROBAXIN) 500 MG tablet  Every 8 hours PRN        09/10/21 2231             Note:  This document was prepared using Dragon voice recognition software and may include unintentional dictation errors.   Shaune Pollack, MD 09/10/21 2256

## 2021-09-10 NOTE — Discharge Instructions (Signed)

## 2021-09-10 NOTE — ED Provider Notes (Signed)
[facility] Emergency Department Provider Note  ____________________________________________  Time seen: Approximately 2:34 AM  I have reviewed the triage vital signs and the nursing notes.   HISTORY  Chief Complaint Chest Pain and Abdominal Pain   HPI Jose [name] is a 60 y.o. male with a history of smoking, alcohol abuse, cocaine abuse, hypertension, hepatitis C who presents for evaluation of chest pain, abdominal pain, vomiting.  Patient continues to drink several beers on a daily basis.  Today started vomiting and having abdominal pain.  Patient does have a history of chronic abdominal pain that is identical to what he is having right now.  Is complaining of diffuse cramping abdominal pain.  He is also complaining of left-sided chest pain that is present with palpation of his chest.  He does not remember falling or hitting his chest.  No shortness of breath, no diarrhea, no fever, no cough.  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   Substance induced mood disorder (HCC) 11/03/2015   Alcohol use disorder, severe, dependence (HCC) 10/24/2015   Alcohol withdrawal (HCC) 10/24/2015   Stimulant use disorder (HCC) (cocaine) 10/24/2015   Tobacco use disorder 10/24/2015   Hepatitis C 09/06/2015    Past Surgical History:  Procedure Laterality Date   ESOPHAGOGASTRODUODENOSCOPY (EGD) WITH PROPOFOL N/A 03/02/2019   Procedure: ESOPHAGOGASTRODUODENOSCOPY (EGD) WITH PROPOFOL;  Surgeon: Violeta Gelinas, MD;  Location: [facility] ENDOSCOPY;  Service: General;  Laterality: N/A;   IR GASTROSTOMY TUBE  REMOVAL  05/04/2019   PEG PLACEMENT N/A 03/02/2019   Procedure: PERCUTANEOUS ENDOSCOPIC GASTROSTOMY (PEG) PLACEMENT;  Surgeon: Violeta Gelinas, MD;  Location: [facility] ENDOSCOPY;  Service: General;  Laterality: N/A;   PERCUTANEOUS TRACHEOSTOMY N/A 02/12/2019   Procedure: PERCUTANEOUS TRACHEOSTOMY;  Surgeon: Violeta Gelinas, MD;  Location: [facility] OR;  Service: General;  Laterality: N/A;    Prior to Admission medications   Medication Sig Start Date End Date Taking? Authorizing Provider  Amino Acids-Protein Hydrolys (FEEDING SUPPLEMENT, PRO-STAT SUGAR FREE 64,) LIQD Place 30 mLs into feeding tube 2 (two) times daily. 03/18/19   Meuth, Brooke A, PA-C  amLODipine (NORVASC) 5 MG tablet Take 1 tablet (5 mg total) by mouth daily. 08/17/21 09/16/21  Gilles Chiquito, MD  bacitracin 500 UNIT/GM ointment Apply 1 application topically 2 (two) times daily. 08/16/21   Ward, Layla Maw, DO  clonazePAM (KLONOPIN) 1 MG disintegrating tablet Take 1 tablet (1 mg total) by mouth 2 (two) times daily as needed for seizure (and/or agitation). Patient not taking: Reported on 06/25/2021 03/18/19   Carlena Bjornstad A, PA-C  dicyclomine (BENTYL) 20 MG tablet Take 1 tablet (20 mg total) by mouth every 8 (eight) hours as needed for spasms (Abdominal cramping). 08/17/21   Gilles Chiquito, MD  folic acid (FOLVITE) 1 MG tablet Take 1 tablet (1 mg total) by mouth daily. 08/17/21   Gilles Chiquito, MD  hydrochlorothiazide (MICROZIDE) 12.5 MG capsule Take 2 capsules (25 mg total) by mouth daily. 08/17/21   Gilles Chiquito, MD  metoprolol tartrate (LOPRESSOR) 25 mg/10 mL SUSP Take 10 mLs (25 mg total) by mouth 2 (two) times  daily. Patient not taking: Reported on 06/25/2021 03/18/19   Carlena Bjornstad A, PA-C  Multiple Vitamin (MULTIVITAMIN WITH MINERALS) TABS tablet Take 1 tablet by mouth daily. 08/17/21   Gilles Chiquito, MD  Nutritional Supplements (FEEDING SUPPLEMENT, OSMOLITE 1.5 CAL,) LIQD Place 474 mLs into feeding tube 3 (three) times daily.  03/18/19   Meuth, Brooke A, PA-C  ondansetron (ZOFRAN-ODT) 4 MG disintegrating tablet Take 1 tablet (4 mg total) by mouth every 6 (six) hours as needed for nausea or vomiting. 08/17/21   Gilles Chiquito, MD  pantoprazole (PROTONIX) 20 MG tablet Take 1 tablet (20 mg total) by mouth daily. 08/17/21   Gilles Chiquito, MD  QUEtiapine (SEROQUEL) 100 MG tablet Take 1 tablet (100 mg total) by mouth at bedtime. Patient not taking: Reported on 06/25/2021 03/18/19   Carlena Bjornstad A, PA-C  thiamine 100 MG tablet Take 1 tablet (100 mg total) by mouth daily. 08/17/21   Gilles Chiquito, MD    Allergies Patient has no known allergies.  History reviewed. No pertinent family history.  Social History Social History   Tobacco Use   Smoking status: Every Day    Packs/day: 0.50    Years: 45.00    Pack years: 22.50    Types: Cigarettes   Smokeless tobacco: Never  Vaping Use   Vaping Use: Never used  Substance Use Topics   Alcohol use: Yes    Comment: 12 pack per day   Drug use: Yes    Types: Cocaine, Marijuana    Comment: today    Review of Systems  Constitutional: Negative for fever. Eyes: Negative for visual changes. ENT: Negative for sore throat. Neck: No neck pain  Cardiovascular: Negative for chest pain. + L sided chest wall pain Respiratory: Negative for shortness of breath. Gastrointestinal: + diffuse abdominal pain, nausea, and vomiting. No diarrhea. Genitourinary: Negative for dysuria. Musculoskeletal: Negative for back pain. Skin: Negative for rash. Neurological: Negative for headaches, weakness or numbness. Psych: No SI or HI  ____________________________________________   PHYSICAL EXAM:  VITAL SIGNS: ED Triage Vitals  Enc Vitals Group     BP 09/09/21 2235 128/88     Pulse Rate 09/09/21 2235 94     Resp 09/09/21 2235 16     Temp 09/09/21 2235 97.6 F (36.4 C)     Temp Source 09/09/21 2235 Oral     SpO2 09/09/21 2235 93 %     Weight 09/09/21 2233 140 lb (63.5 kg)      Height 09/09/21 2233  (1.727 m)     Head Circumference --      Peak Flow --      Pain Score 09/09/21 2233 10     Pain Loc --      Pain Edu? --      Excl. in GC? --     Constitutional: Alert and oriented. Well appearing and in no apparent distress. HEENT:      Head: Normocephalic and atraumatic.         Eyes: Conjunctivae are normal. Sclera is non-icteric.       Mouth/Throat: Mucous membranes are moist.       Neck: Supple with no signs of meningismus. Cardiovascular: Regular rate and rhythm. No murmurs, gallops, or rubs. 2+ symmetrical distal pulses are present in all extremities. No JVD.  Chest wall is atraumatic.  Patient is tender to palpation on the left chest wall Respiratory: Normal respiratory effort. Lungs are clear to auscultation bilaterally.  Gastrointestinal: Soft, mild  diffuse tenderness to palpation, and non distended with positive bowel sounds. No rebound or guarding. Genitourinary: No CVA tenderness. Musculoskeletal:  No edema, cyanosis, or erythema of extremities. Neurologic: Normal speech and language. Face is symmetric. Moving all extremities. No gross focal neurologic deficits are appreciated. Skin: Skin is warm, dry and intact. No rash noted. Psychiatric: Mood and affect are normal. Speech and behavior are normal.  ____________________________________________   LABS (all labs ordered are listed, but only abnormal results are displayed)  Labs Reviewed  BASIC METABOLIC PANEL - Abnormal; Notable for the following components:      Result Value   Sodium 131 (*)    Potassium 3.4 (*)    Chloride 97 (*)    Creatinine, Ser 0.52 (*)    All other components within normal limits  CBC - Abnormal; Notable for the following components:   RBC 4.04 (*)    Platelets 98 (*)    All other components within normal limits  HEPATIC FUNCTION PANEL - Abnormal; Notable for the following components:   AST 94 (*)    ALT 60 (*)    All other components within normal limits   LIPASE, BLOOD  TROPONIN I (HIGH SENSITIVITY)  TROPONIN I (HIGH SENSITIVITY)   ____________________________________________  EKG  ED ECG REPORT I, Nita Sickle, the attending physician, personally viewed and interpreted this ECG.  Sinus rhythm with a rate of 92, right bundle branch block, no ST elevations or depressions.  Unchanged from prior. ____________________________________________  RADIOLOGY  I have personally reviewed the images performed during this visit and I agree with the Radiologist's read.   Interpretation by Radiologist:  DG Chest 2 View  Result Date: 09/09/2021 CLINICAL DATA:  Chest pain EXAM: CHEST - 2 VIEW COMPARISON:  08/16/2021 multiple healed left FINDINGS: Lungs are well expanded, symmetric, and clear. No pneumothorax or pleural effusion. Cardiac size within normal limits. Pulmonary vascularity is normal. Osseous structures are age-appropriate. Rib fractures are again noted. No acute bone abnormality. IMPRESSION: No active cardiopulmonary disease. Electronically Signed   By: Helyn Numbers M.D.   On: 09/09/2021 23:00     ____________________________________________   PROCEDURES  Procedure(s) performed: None Procedures   Critical Care performed:  None ____________________________________________   INITIAL IMPRESSION / ASSESSMENT AND PLAN / ED COURSE  60 y.o. male with a history of smoking, alcohol abuse, cocaine abuse, hypertension, hepatitis C who presents for evaluation of chest pain, abdominal pain, vomiting.  Patient sleeping comfortably, arouses easily.  Chest wall is atraumatic but patient is tender to palpation diffusely on the left chest wall.  Abdomen is nondistended with mild diffuse tenderness, no localized tenderness, rebound or guarding.  Review of EMR shows that this is patient's fifth visit to the ER in the last 2 months for the same complaints.  Patient does report that his symptoms are the same and ongoing for the last 2 months.   Patient has undergone CT of the chest once and abdomen pelvis twice in the last 2 months showing no acute abnormalities.  His EKG is unchanged from baseline.  2 high-sensitivity troponins that are negative.  Labs showing no leukocytosis, no anemia, stable mild thrombocytopenia.  Slightly elevated LFTs unchanged from baseline with a normal T bili, normal lipase, normal electrolytes, no signs of AKI or significant dehydration.  Chest x-ray visualized by me with no acute findings such as rib fractures, pneumonia, pneumothorax.  Abdominal pain and vomiting in the setting of alcohol most likely gastritis.  With 2 negative CTs in the last  2 months and same symptoms I do feel that patient will benefit for another CT at this time specially with normal labs and a benign exam.  The chest pain seems to be musculoskeletal in nature since it is reproducible with palpation of the chest.  With a normal EKG, normal 2 troponins, normal chest x-ray I do not feel the patient warrants any further imaging for this.  He has normal work of breathing and normal sats, his lungs are clear to auscultation otherwise.  Patient was given 1 Percocet and Zofran for his symptoms.  He is tolerating p.o. no further episodes of vomiting.  We did discuss the patient should be seeing a GI specialist for further evaluation of his chronic abdominal pain which is most likely cause by gastritis from alcohol. Recommended f/u with PCP and discussed my standard return precautions for new or worsening chest pain, abdominal pain, hemoptysis, hematemesis, fever, shortness of breath      _____________________________________________ Please note:  Patient was evaluated in Emergency Department today for the symptoms described in the history of present illness. Patient was evaluated in the context of the global COVID-19 pandemic, which necessitated consideration that the patient might be at risk for infection with the SARS-CoV-2 virus that causes COVID-19.  Institutional protocols and algorithms that pertain to the evaluation of patients at risk for COVID-19 are in a state of rapid change based on information released by regulatory bodies including the CDC and federal and state organizations. These policies and algorithms were followed during the patient's care in the ED.  Some ED evaluations and interventions may be delayed as a result of limited staffing during the pandemic.    Controlled Substance Database was reviewed by me. ____________________________________________   FINAL CLINICAL IMPRESSION(S) / ED DIAGNOSES   Final diagnoses:  None      NEW MEDICATIONS STARTED DURING THIS VISIT:  ED Discharge Orders     None        Note:  This document was prepared using Dragon voice recognition software and may include unintentional dictation errors.    Nita Sickle, MD 09/10/21 [phone]

## 2021-09-10 NOTE — ED Notes (Signed)
Lab called to add on hepatic function panel

## 2021-09-13 ENCOUNTER — Other Ambulatory Visit: Payer: Self-pay

## 2021-09-13 ENCOUNTER — Encounter: Payer: Self-pay | Admitting: Emergency Medicine

## 2021-09-13 DIAGNOSIS — S39012A Strain of muscle, fascia and tendon of lower back, initial encounter: Secondary | ICD-10-CM | POA: Insufficient documentation

## 2021-09-13 DIAGNOSIS — S3992XA Unspecified injury of lower back, initial encounter: Secondary | ICD-10-CM | POA: Diagnosis present

## 2021-09-13 DIAGNOSIS — Z79899 Other long term (current) drug therapy: Secondary | ICD-10-CM | POA: Insufficient documentation

## 2021-09-13 DIAGNOSIS — M5431 Sciatica, right side: Secondary | ICD-10-CM | POA: Insufficient documentation

## 2021-09-13 DIAGNOSIS — Z5900 Homelessness unspecified: Secondary | ICD-10-CM | POA: Diagnosis not present

## 2021-09-13 DIAGNOSIS — I10 Essential (primary) hypertension: Secondary | ICD-10-CM | POA: Diagnosis not present

## 2021-09-13 DIAGNOSIS — X58XXXA Exposure to other specified factors, initial encounter: Secondary | ICD-10-CM | POA: Diagnosis not present

## 2021-09-13 DIAGNOSIS — F17210 Nicotine dependence, cigarettes, uncomplicated: Secondary | ICD-10-CM | POA: Insufficient documentation

## 2021-09-13 NOTE — ED Triage Notes (Signed)
Pt via [facility] reporting multiple complaints including back pain, hip pain, leg pain, neck pain, headache, abdominal pain, lung pain, and chest pain since last week. He has been evaluated multiple times for the same and has no new complaints. Currently rates pain 10/10 generalized. NAD in triage.

## 2021-09-14 ENCOUNTER — Emergency Department
Admission: EM | Admit: 2021-09-14 | Discharge: 2021-09-14 | Disposition: A | Discharge status: Home / Self Care | Payer: Medicaid Other | Attending: Emergency Medicine | Admitting: Emergency Medicine

## 2021-09-14 DIAGNOSIS — S39012A Strain of muscle, fascia and tendon of lower back, initial encounter: Secondary | ICD-10-CM

## 2021-09-14 DIAGNOSIS — Z5900 Homelessness unspecified: Secondary | ICD-10-CM

## 2021-09-14 DIAGNOSIS — M5431 Sciatica, right side: Secondary | ICD-10-CM

## 2021-09-14 MED ORDER — METHOCARBAMOL 500 MG PO TABS
500.0000 mg | ORAL_TABLET | Freq: Once | ORAL | Status: AC
  Administered 2021-09-14: 08:00:00 500 mg via ORAL
  Filled 2021-09-14: qty 1

## 2021-09-14 MED ORDER — LIDOCAINE 5 % EX PTCH: 1.0000 | MEDICATED_PATCH | Freq: Two times a day (BID) | CUTANEOUS | 0 refills | Status: DC

## 2021-09-14 MED ORDER — LIDOCAINE 5 % EX PTCH
1.0000 | MEDICATED_PATCH | Freq: Once | CUTANEOUS | Status: DC
  Administered 2021-09-14: 08:00:00 1 via TRANSDERMAL
  Filled 2021-09-14: qty 1

## 2021-09-14 MED ORDER — NAPROXEN 500 MG PO TABS
500.0000 mg | ORAL_TABLET | Freq: Once | ORAL | Status: AC
  Administered 2021-09-14: 08:00:00 500 mg via ORAL
  Filled 2021-09-14: qty 1

## 2021-09-14 MED ORDER — ACETAMINOPHEN 500 MG PO TABS
1000.0000 mg | ORAL_TABLET | Freq: Once | ORAL | Status: AC
  Administered 2021-09-14: 08:00:00 1000 mg via ORAL
  Filled 2021-09-14: qty 2

## 2021-09-14 NOTE — ED Provider Notes (Signed)
[facility] Emergency Department Provider Note ____________________________________________   Event Date/Time   First MD Initiated Contact with Patient 09/14/21 [phone]     (approximate)  I have reviewed the triage vital signs and the nursing notes.  HISTORY  Chief Complaint Back Pain   HPI Jose [name] is a 60 y.o. malewho presents to the ED for evaluation of pain.  Chart review indicates history of hypertension, polysubstance abuse and multiple ED visits in the past week for similar complaints. Seen twice 4 days ago for similar complaints.  Benign CXR.  Last CT of his abdomen was on 11/23.  Patient presents to the ED for evaluation of 1 or 2 weeks of lower back pain, chest pain, abdominal pain, headache.  Reports that he is currently undomiciled and bouncing around between couches at friends houses, staying at [place] for much of the day where it is warm.  He reports 10/10 intensity pain everywhere throughout his body without any falls, trauma, assault or incidents.  Reports he "tweaked" his back for 5 days ago, when lifting something, but denies any falls or direct injury.  Reports he is ambulating at his baseline without saddle anesthesias, fever.  Denies IVDU for many years.  Reports radiating back pain down his right hip and buttocks.  Reports inability to pay for any OTC medications.  Last had food yesterday, tolerated without difficulty and denies emesis or diarrhea.  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   Substance induced mood disorder (HCC) 11/03/2015   Alcohol use  disorder, severe, dependence (HCC) 10/24/2015   Alcohol withdrawal (HCC) 10/24/2015   Stimulant use disorder (HCC) (cocaine) 10/24/2015   Tobacco use disorder 10/24/2015   Hepatitis C 09/06/2015    Past Surgical History:  Procedure Laterality Date   ESOPHAGOGASTRODUODENOSCOPY (EGD) WITH PROPOFOL N/A 03/02/2019   Procedure: ESOPHAGOGASTRODUODENOSCOPY (EGD) WITH PROPOFOL;  Surgeon: Violeta Gelinas, MD;  Location: [facility] ENDOSCOPY;  Service: General;  Laterality: N/A;   IR GASTROSTOMY TUBE REMOVAL  05/04/2019   PEG PLACEMENT N/A 03/02/2019   Procedure: PERCUTANEOUS ENDOSCOPIC GASTROSTOMY (PEG) PLACEMENT;  Surgeon: Violeta Gelinas, MD;  Location: [facility] ENDOSCOPY;  Service: General;  Laterality: N/A;   PERCUTANEOUS TRACHEOSTOMY N/A 02/12/2019   Procedure: PERCUTANEOUS TRACHEOSTOMY;  Surgeon: Violeta Gelinas, MD;  Location: [facility] OR;  Service: General;  Laterality: N/A;    Prior to Admission medications   Medication Sig Start Date End Date Taking? Authorizing Provider  Amino Acids-Protein Hydrolys (FEEDING SUPPLEMENT, PRO-STAT SUGAR FREE 64,) LIQD Place 30 mLs into feeding tube 2 (two) times daily. 03/18/19   Meuth, Brooke A, PA-C  amLODipine (NORVASC) 5 MG tablet Take 1 tablet (5 mg total) by mouth daily. 08/17/21 09/16/21  Gilles Chiquito, MD  bacitracin 500 UNIT/GM ointment Apply 1 application topically 2 (two) times daily. 08/16/21   Ward, Layla Maw, DO  clonazePAM (KLONOPIN) 1 MG disintegrating tablet Take 1 tablet (1 mg total) by mouth 2 (two) times daily as needed for seizure (and/or agitation). Patient not taking: Reported on 06/25/2021 03/18/19   Carlena Bjornstad A, PA-C  dicyclomine (BENTYL) 20 MG tablet Take 1 tablet (  20 mg total) by mouth every 8 (eight) hours as needed for spasms (Abdominal cramping). 08/17/21   Gilles Chiquito, MD  folic acid (FOLVITE) 1 MG tablet Take 1 tablet (1 mg total) by mouth daily. 08/17/21   Gilles Chiquito, MD  hydrochlorothiazide (MICROZIDE) 12.5 MG capsule Take 2 capsules  (25 mg total) by mouth daily. 08/17/21   Gilles Chiquito, MD  ibuprofen (ADVIL) 600 MG tablet Take 1 tablet (600 mg total) by mouth every 8 (eight) hours as needed for moderate pain. 09/10/21   Shaune Pollack, MD  methocarbamol (ROBAXIN) 500 MG tablet Take 1 tablet (500 mg total) by mouth every 8 (eight) hours as needed for up to 7 days for muscle spasms. 09/10/21 09/17/21  Shaune Pollack, MD  metoprolol tartrate (LOPRESSOR) 25 mg/10 mL SUSP Take 10 mLs (25 mg total) by mouth 2 (two) times daily. Patient not taking: Reported on 06/25/2021 03/18/19   Carlena Bjornstad A, PA-C  Multiple Vitamin (MULTIVITAMIN WITH MINERALS) TABS tablet Take 1 tablet by mouth daily. 08/17/21   Gilles Chiquito, MD  Nutritional Supplements (FEEDING SUPPLEMENT, OSMOLITE 1.5 CAL,) LIQD Place 474 mLs into feeding tube 3 (three) times daily. 03/18/19   Meuth, Brooke A, PA-C  ondansetron (ZOFRAN-ODT) 4 MG disintegrating tablet Take 1 tablet (4 mg total) by mouth every 6 (six) hours as needed for nausea or vomiting. 08/17/21   Gilles Chiquito, MD  pantoprazole (PROTONIX) 20 MG tablet Take 1 tablet (20 mg total) by mouth daily. 08/17/21   Gilles Chiquito, MD  QUEtiapine (SEROQUEL) 100 MG tablet Take 1 tablet (100 mg total) by mouth at bedtime. Patient not taking: Reported on 06/25/2021 03/18/19   Carlena Bjornstad A, PA-C  thiamine 100 MG tablet Take 1 tablet (100 mg total) by mouth daily. 08/17/21   Gilles Chiquito, MD    Allergies Patient has no known allergies.  History reviewed. No pertinent family history.  Social History Social History   Tobacco Use   Smoking status: Every Day    Packs/day: 0.50    Years: 45.00    Pack years: 22.50    Types: Cigarettes   Smokeless tobacco: Never  Vaping Use   Vaping Use: Never used  Substance Use Topics   Alcohol use: Yes    Comment: 12 pack per day   Drug use: Yes    Types: Cocaine, Marijuana    Comment: today    Review of Systems  Constitutional: No fever/chills Eyes:  No visual changes. ENT: No sore throat. Cardiovascular: Denies chest pain. Respiratory: Denies shortness of breath. Gastrointestinal: Positive for abdominal pain.   No nausea, no vomiting.  No diarrhea.  No constipation. Genitourinary: Negative for dysuria. Musculoskeletal: Positive for back, chest, head pain. Skin: Negative for rash. Neurological: Negative for headaches, focal weakness or numbness.  ____________________________________________   PHYSICAL EXAM:  VITAL SIGNS: Vitals:   09/14/21 0715 09/14/21 0723  BP: (!) 162/98 (!) 162/98  Pulse: 82 84  Resp: 20 20  Temp:    SpO2: 96% 95%     Constitutional: Alert and oriented. Well appearing and in no acute distress.  Resting comfortably when I entered the room, easy to arouse.  Pleasant and conversational.  Able to sit up independently so I can examine his back. Eyes: Conjunctivae are normal. PERRL. EOMI. Head: Atraumatic. Nose: No congestion/rhinnorhea. Mouth/Throat: Mucous membranes are moist.  Oropharynx non-erythematous. Neck: No stridor. No cervical spine tenderness to palpation. Cardiovascular: Normal rate, regular rhythm. Grossly normal heart sounds.  Good peripheral circulation. Respiratory: Normal respiratory effort.  No retractions. Lungs CTAB. Gastrointestinal: Soft , nondistended, nontender to palpation. No CVA tenderness. Musculoskeletal: No lower extremity tenderness nor edema.  No joint effusions. No signs of acute trauma. A degree of hyperalgesia throughout.  Most tender to his bilateral paraspinal lumbar back.  No midline spinal tenderness, step-offs or signs of trauma to the back. Neurologic:  Normal speech and language. No gross focal neurologic deficits are appreciated.  Cranial nerves II through XII intact 5/5 strength and sensation in all 4 extremities Skin:  Skin is warm, dry and intact. No rash noted. Psychiatric: Mood and affect are normal. Speech and behavior are  normal. ____________________________________________   LABS (all labs ordered are listed, but only abnormal results are displayed)  Labs Reviewed - No data to display ____________________________________________  12 Lead EKG   ____________________________________________  RADIOLOGY  ED MD interpretation:    Official radiology report(s): No results found.  ____________________________________________   PROCEDURES and INTERVENTIONS  Procedure(s) performed (including Critical Care):  Procedures  Medications  acetaminophen (TYLENOL) tablet 1,000 mg (has no administration in time range)  naproxen (NAPROSYN) tablet 500 mg (has no administration in time range)  lidocaine (LIDODERM) 5 % 1 patch (has no administration in time range)  methocarbamol (ROBAXIN) tablet 500 mg (has no administration in time range)    ____________________________________________   MDM / ED COURSE   Local undomiciled gentleman presents to the ED with subacute and chronic pain without evidence of any acute pathology.  He has normal vitals and has a reassuring examination without evidence of neurologic or vascular deficits.  No signs of trauma.  No evidence of cord compression or cauda equina syndrome.  I suspect a degree of his presentation is due to the rain and cold weather outside.  We discussed nonnarcotic multimodal analgesia, p.o. challenge with anticipation of outpatient management and he is agreeable.     ____________________________________________   FINAL CLINICAL IMPRESSION(S) / ED DIAGNOSES  Final diagnoses:  Strain of lumbar region, initial encounter  Homelessness  Sciatica of right side     ED Discharge Orders     None        Bita Cartwright   Note:  This document was prepared using Dragon voice recognition software and may include unintentional dictation errors.    Delton Prairie, MD 09/14/21 1440

## 2021-09-14 NOTE — ED Notes (Signed)
Pt verbalizes understanding of d/c instructions and follow up. 

## 2021-09-14 NOTE — Discharge Instructions (Addendum)
Please take Tylenol and ibuprofen/Advil for your pain.  It is safe to take them together, or to alternate them every few hours.  Take up to 1000mg of Tylenol at a time, up to 4 times per day.  Do not take more than 4000 mg of Tylenol in 24 hours.  For ibuprofen, take 400-600 mg, 4-5 times per day.  Please use lidocaine patches and your site of pain.  Apply 1 patch at a time, leave on for 12 hours, then remove for 12 hours.  12 hours on, 12 hours off.  Do not apply more than 1 patch at a time.  

## 2021-09-14 NOTE — ED Notes (Signed)
Pt reports was walking the other day and twisted his back. Pt states was seen here and given a shot because he has back problems anyway but it not helping and he can hardly walk. Pt reports legs are numb.

## 2021-09-15 ENCOUNTER — Encounter: Payer: Self-pay | Admitting: Emergency Medicine

## 2021-09-15 DIAGNOSIS — Z79899 Other long term (current) drug therapy: Secondary | ICD-10-CM | POA: Insufficient documentation

## 2021-09-15 DIAGNOSIS — M549 Dorsalgia, unspecified: Secondary | ICD-10-CM | POA: Diagnosis not present

## 2021-09-15 DIAGNOSIS — F17210 Nicotine dependence, cigarettes, uncomplicated: Secondary | ICD-10-CM | POA: Diagnosis not present

## 2021-09-15 DIAGNOSIS — F1010 Alcohol abuse, uncomplicated: Secondary | ICD-10-CM | POA: Insufficient documentation

## 2021-09-15 DIAGNOSIS — I10 Essential (primary) hypertension: Secondary | ICD-10-CM | POA: Diagnosis not present

## 2021-09-15 NOTE — ED Triage Notes (Signed)
Pt presents to the ED with complaints of chronic low back pain. He states he was seen here recently and was given lidocaine patches and they have not been very effective. He states he also wishes to have assistance with detoxing from alcohol. He states he had on beer today and has recently quit drinking but is still needing assistance with his habit. Denies CP or SOB.

## 2021-09-16 ENCOUNTER — Emergency Department
Admission: EM | Admit: 2021-09-16 | Discharge: 2021-09-16 | Disposition: A | Discharge status: Home / Self Care | Payer: Medicaid Other | Attending: Emergency Medicine | Admitting: Emergency Medicine

## 2021-09-16 DIAGNOSIS — F1010 Alcohol abuse, uncomplicated: Secondary | ICD-10-CM

## 2021-09-16 MED ORDER — NAPROXEN 500 MG PO TABS
500.0000 mg | ORAL_TABLET | Freq: Once | ORAL | Status: AC
  Administered 2021-09-16: 08:00:00 500 mg via ORAL
  Filled 2021-09-16: qty 1

## 2021-09-16 MED ORDER — DEXAMETHASONE SODIUM PHOSPHATE 10 MG/ML IJ SOLN
10.0000 mg | Freq: Once | INTRAMUSCULAR | Status: AC
  Administered 2021-09-16: 08:00:00 10 mg via INTRAMUSCULAR
  Filled 2021-09-16: qty 1

## 2021-09-16 MED ORDER — NAPROXEN 375 MG PO TABS: 375.0000 mg | ORAL_TABLET | Freq: Two times a day (BID) | ORAL | 1 refills | Status: DC

## 2021-09-16 NOTE — ED Notes (Signed)
Pt sleeping in lobby in no acute distress.  

## 2021-09-16 NOTE — ED Provider Notes (Signed)
[facility] Emergency Department Provider Note   ____________________________________________    I have reviewed the triage vital signs and the nursing notes.   HISTORY  Chief Complaint Back Pain and Alcohol Problem     HPI Jose [name] is a 60 y.o. male who presents with complaints of back pain.  Patient reports a history of chronic back pain.  He reports it is all up and down his back.  He denies fevers chills.  Occasionally has pain radiating down his legs bilaterally, normal strength in the lower extremities.  No saddle anesthesia. Reports in the past he has gotten an injection in the emergency department which helped his pain.  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   Substance induced mood disorder (HCC) 11/03/2015   Alcohol use disorder, severe, dependence (HCC) 10/24/2015   Alcohol withdrawal (HCC) 10/24/2015   Stimulant use disorder (HCC) (cocaine) 10/24/2015   Tobacco use disorder 10/24/2015   Hepatitis C 09/06/2015    Past Surgical History:  Procedure Laterality Date   ESOPHAGOGASTRODUODENOSCOPY (EGD) WITH PROPOFOL N/A 03/02/2019   Procedure: ESOPHAGOGASTRODUODENOSCOPY (EGD) WITH PROPOFOL;  Surgeon: Violeta Gelinas, MD;  Location: [facility] ENDOSCOPY;  Service: General;  Laterality: N/A;   IR GASTROSTOMY TUBE REMOVAL  05/04/2019   PEG PLACEMENT N/A 03/02/2019   Procedure: PERCUTANEOUS ENDOSCOPIC GASTROSTOMY (PEG) PLACEMENT;  Surgeon: Violeta Gelinas, MD;  Location: [facility] ENDOSCOPY;   Service: General;  Laterality: N/A;   PERCUTANEOUS TRACHEOSTOMY N/A 02/12/2019   Procedure: PERCUTANEOUS TRACHEOSTOMY;  Surgeon: Violeta Gelinas, MD;  Location: [facility] OR;  Service: General;  Laterality: N/A;    Prior to Admission medications   Medication Sig Start Date End Date Taking? Authorizing Provider  naproxen (NAPROSYN) 375 MG tablet Take 1 tablet (375 mg total) by mouth 2 (two) times daily with a meal. 09/16/21  Yes Jene Every, MD  Amino Acids-Protein Hydrolys (FEEDING SUPPLEMENT, PRO-STAT SUGAR FREE 64,) LIQD Place 30 mLs into feeding tube 2 (two) times daily. 03/18/19   Meuth, Brooke A, PA-C  amLODipine (NORVASC) 5 MG tablet Take 1 tablet (5 mg total) by mouth daily. 08/17/21 09/16/21  Gilles Chiquito, MD  bacitracin 500 UNIT/GM ointment Apply 1 application topically 2 (two) times daily. 08/16/21   Ward, Layla Maw, DO  clonazePAM (KLONOPIN) 1 MG disintegrating tablet Take 1 tablet (1 mg total) by mouth 2 (two) times daily as needed for seizure (and/or agitation). Patient not taking: Reported on 06/25/2021 03/18/19   Carlena Bjornstad A, PA-C  dicyclomine (BENTYL) 20 MG tablet Take 1 tablet (20 mg total) by mouth every 8 (eight) hours as needed for spasms (Abdominal cramping). 08/17/21   Gilles Chiquito, MD  folic acid (FOLVITE) 1 MG tablet Take 1 tablet (1 mg total) by mouth daily. 08/17/21   Gilles Chiquito, MD  hydrochlorothiazide (MICROZIDE) 12.5 MG capsule Take 2 capsules (25 mg total) by mouth daily. 08/17/21   Gilles Chiquito, MD  ibuprofen (ADVIL) 600 MG tablet Take 1 tablet (600 mg total) by mouth every  8 (eight) hours as needed for moderate pain. 09/10/21   Shaune Pollack, MD  lidocaine (LIDODERM) 5 % Place 1 patch onto the skin every 12 (twelve) hours. Remove & Discard patch within 12 hours or as directed by MD 09/14/21 09/14/22  Delton Prairie, MD  methocarbamol (ROBAXIN) 500 MG tablet Take 1 tablet (500 mg total) by mouth every 8 (eight) hours as needed for up to 7 days for muscle  spasms. 09/10/21 09/17/21  Shaune Pollack, MD  metoprolol tartrate (LOPRESSOR) 25 mg/10 mL SUSP Take 10 mLs (25 mg total) by mouth 2 (two) times daily. Patient not taking: Reported on 06/25/2021 03/18/19   Carlena Bjornstad A, PA-C  Multiple Vitamin (MULTIVITAMIN WITH MINERALS) TABS tablet Take 1 tablet by mouth daily. 08/17/21   Gilles Chiquito, MD  Nutritional Supplements (FEEDING SUPPLEMENT, OSMOLITE 1.5 CAL,) LIQD Place 474 mLs into feeding tube 3 (three) times daily. 03/18/19   Meuth, Brooke A, PA-C  ondansetron (ZOFRAN-ODT) 4 MG disintegrating tablet Take 1 tablet (4 mg total) by mouth every 6 (six) hours as needed for nausea or vomiting. 08/17/21   Gilles Chiquito, MD  pantoprazole (PROTONIX) 20 MG tablet Take 1 tablet (20 mg total) by mouth daily. 08/17/21   Gilles Chiquito, MD  QUEtiapine (SEROQUEL) 100 MG tablet Take 1 tablet (100 mg total) by mouth at bedtime. Patient not taking: Reported on 06/25/2021 03/18/19   Carlena Bjornstad A, PA-C  thiamine 100 MG tablet Take 1 tablet (100 mg total) by mouth daily. 08/17/21   Gilles Chiquito, MD     Allergies Patient has no known allergies.  History reviewed. No pertinent family history.  Social History Social History   Tobacco Use   Smoking status: Every Day    Packs/day: 0.50    Years: 45.00    Pack years: 22.50    Types: Cigarettes   Smokeless tobacco: Never  Vaping Use   Vaping Use: Never used  Substance Use Topics   Alcohol use: Yes    Alcohol/week: 2.0 standard drinks    Types: 2 Cans of beer per week    Comment: 12 pack per day   Drug use: Not Currently    Types: Cocaine, Marijuana    Comment: today    Review of Systems  Constitutional: No fever/chills Eyes: No visual changes.  ENT: No sore throat. Cardiovascular: Denies chest pain. Respiratory: Denies shortness of breath. Gastrointestinal: No abdominal pain.   Genitourinary: Negative for dysuria. Musculoskeletal: As above Skin: Negative for rash. Neurological:  Negative for weakness   ____________________________________________   PHYSICAL EXAM:  VITAL SIGNS: ED Triage Vitals [09/15/21 2102]  Enc Vitals Group     BP (!) 157/98     Pulse Rate 99     Resp 20     Temp 97.9 F (36.6 C)     Temp Source Oral     SpO2 99 %     Weight 65.8 kg (145 lb)     Height 1.727 m (5\' 8" )     Head Circumference      Peak Flow      Pain Score 10     Pain Loc      Pain Edu?      Excl. in GC?     Constitutional: Alert and oriented. No acute distress. Pleasant and interactive Eyes: Conjunctivae are normal.  Head: Atraumatic.   Cardiovascular: Normal rate, regular rhythm. Grossly normal heart sounds.  Good peripheral circulation. Respiratory: Normal respiratory effort.  No retractions. Lungs  CTAB. Gastrointestinal: Soft and nontender. No distention.  No CVA tenderness. Genitourinary: deferred Musculoskeletal: Normal strength in the lower extremities, no saddle anesthesia.  No vertebral tenderness palpation, mild paraspinal muscle tenderness bilaterally particularly in the lumbar spine Neurologic:  Normal speech and language. No gross focal neurologic deficits are appreciated.  Skin:  Skin is warm, dry and intact. No rash noted. Psychiatric: Mood and affect are normal. Speech and behavior are normal.  ____________________________________________   LABS (all labs ordered are listed, but only abnormal results are displayed)  Labs Reviewed - No data to display ____________________________________________  EKG   ____________________________________________  RADIOLOGY   ____________________________________________   PROCEDURES  Procedure(s) performed: No  Procedures   Critical Care performed: No ____________________________________________   INITIAL IMPRESSION / ASSESSMENT AND PLAN / ED COURSE  Pertinent labs & imaging results that were available during my care of the patient were reviewed by me and considered in my medical  decision making (see chart for details).   Patient well-appearing and in no acute distress.  Exam is overall reassuring, no signs of weakness, no fever.  This seems to be in keeping with his chronic back pain  Patient treated with IM Decadron, p.o. naproxen  Will refer to [facility] for alcohol detox  Outpatient referral to orthopedics    ____________________________________________   FINAL CLINICAL IMPRESSION(S) / ED DIAGNOSES  Final diagnoses:  Alcohol abuse        Note:  This document was prepared using Dragon voice recognition software and may include unintentional dictation errors.    Jene Every, MD 09/16/21 [phone]

## 2021-09-16 NOTE — ED Notes (Signed)
NT wasn't able to wake pt for vitals.

## 2021-09-19 ENCOUNTER — Other Ambulatory Visit: Payer: Self-pay

## 2021-09-19 ENCOUNTER — Emergency Department: Payer: Medicaid Other

## 2021-09-19 ENCOUNTER — Encounter: Payer: Self-pay | Admitting: Emergency Medicine

## 2021-09-19 ENCOUNTER — Emergency Department
Admission: EM | Admit: 2021-09-19 | Discharge: 2021-09-19 | Disposition: A | Discharge status: Home / Self Care | Payer: Medicaid Other | Attending: Student in an Organized Health Care Education/Training Program | Admitting: Student in an Organized Health Care Education/Training Program

## 2021-09-19 DIAGNOSIS — G8929 Other chronic pain: Secondary | ICD-10-CM | POA: Diagnosis not present

## 2021-09-19 DIAGNOSIS — X500XXA Overexertion from strenuous movement or load, initial encounter: Secondary | ICD-10-CM | POA: Insufficient documentation

## 2021-09-19 DIAGNOSIS — R079 Chest pain, unspecified: Secondary | ICD-10-CM | POA: Insufficient documentation

## 2021-09-19 DIAGNOSIS — Z79899 Other long term (current) drug therapy: Secondary | ICD-10-CM | POA: Insufficient documentation

## 2021-09-19 DIAGNOSIS — R1084 Generalized abdominal pain: Secondary | ICD-10-CM | POA: Diagnosis not present

## 2021-09-19 DIAGNOSIS — I10 Essential (primary) hypertension: Secondary | ICD-10-CM | POA: Insufficient documentation

## 2021-09-19 DIAGNOSIS — F17210 Nicotine dependence, cigarettes, uncomplicated: Secondary | ICD-10-CM | POA: Insufficient documentation

## 2021-09-19 DIAGNOSIS — M545 Low back pain, unspecified: Secondary | ICD-10-CM | POA: Insufficient documentation

## 2021-09-19 DIAGNOSIS — R0789 Other chest pain: Secondary | ICD-10-CM

## 2021-09-19 LAB — CBC
HCT: 39 % (ref 39.0–52.0)
HCT: 35.4 % — ABNORMAL LOW (ref 39.0–52.0)
Hemoglobin: 13 g/dL (ref 13.0–17.0)
Hemoglobin: 12.1 g/dL — ABNORMAL LOW (ref 13.0–17.0)
MCH: 33.5 pg (ref 26.0–34.0)
MCH: 34.4 pg — ABNORMAL HIGH (ref 26.0–34.0)
MCHC: 33.3 g/dL (ref 30.0–36.0)
MCHC: 34.2 g/dL (ref 30.0–36.0)
MCV: 100.5 fL — ABNORMAL HIGH (ref 80.0–100.0)
MCV: 100.6 fL — ABNORMAL HIGH (ref 80.0–100.0)
Platelets: 142 10*3/uL — ABNORMAL LOW (ref 150–400)
Platelets: 132 10*3/uL — ABNORMAL LOW (ref 150–400)
RBC: 3.88 MIL/uL — ABNORMAL LOW (ref 4.22–5.81)
RBC: 3.52 MIL/uL — ABNORMAL LOW (ref 4.22–5.81)
RDW: 12.9 % (ref 11.5–15.5)
RDW: 12.7 % (ref 11.5–15.5)
WBC: 5.4 10*3/uL (ref 4.0–10.5)
WBC: 5 10*3/uL (ref 4.0–10.5)
nRBC: 0 % (ref 0.0–0.2)
nRBC: 0 % (ref 0.0–0.2)

## 2021-09-19 LAB — BASIC METABOLIC PANEL
Anion gap: 7 (ref 5–15)
BUN: 11 mg/dL (ref 6–20)
CO2: 26 mmol/L (ref 22–32)
Calcium: 9.3 mg/dL (ref 8.9–10.3)
Chloride: 105 mmol/L (ref 98–111)
Creatinine, Ser: 0.71 mg/dL (ref 0.61–1.24)
GFR, Estimated: >60 mL/min (ref >60)
Glucose, Bld: 97 mg/dL (ref 70–99)
Potassium: 3.9 mmol/L (ref 3.5–5.1)
Sodium: 138 mmol/L (ref 135–145)

## 2021-09-19 LAB — TROPONIN I (HIGH SENSITIVITY)
Troponin I (High Sensitivity): 8 ng/L (ref <18)
Troponin I (High Sensitivity): 8 ng/L (ref <18)

## 2021-09-19 MED ORDER — CYCLOBENZAPRINE HCL 10 MG PO TABS: 10.0000 mg | ORAL_TABLET | Freq: Three times a day (TID) | ORAL | 0 refills | Status: DC | PRN

## 2021-09-19 MED ORDER — CYCLOBENZAPRINE HCL 10 MG PO TABS
10.0000 mg | ORAL_TABLET | Freq: Once | ORAL | Status: AC
  Administered 2021-09-19: 10:00:00 10 mg via ORAL
  Filled 2021-09-19: qty 1

## 2021-09-19 MED ORDER — KETOROLAC TROMETHAMINE 30 MG/ML IJ SOLN
30.0000 mg | Freq: Once | INTRAMUSCULAR | Status: AC
  Administered 2021-09-19: 10:00:00 30 mg via INTRAMUSCULAR
  Filled 2021-09-19: qty 1

## 2021-09-19 NOTE — ED Triage Notes (Signed)
Pt to ED from home c/o left chest pain for the last month.  States was here yesterday for back pain and received shot that isn't working.  SOB, n/v/d.  States blood in urine.  Pt A&Ox4, chest rise even and unlabored, skin WNL and in NAD at this time.

## 2021-09-19 NOTE — ED Provider Notes (Signed)
[facility] Emergency Department Provider Note    Event Date/Time   First MD Initiated Contact with Patient 09/19/21 [phone]     (approximate)  I have reviewed the triage vital signs and the nursing notes.   HISTORY  Chief Complaint Chest Pain and Back Pain    HPI Jose [name] is a 60 y.o. male with a history of polysubstance abuse as well as chronic back pain presents to the ER for back pain chest pain started yesterday after he was lifting heavy objects.  Denies any numbness or tingling.  Pain consistent with previous episodes.  Denies any pain with deep inspiration no shortness of breath.  No cough or congestion no fevers.  Denies any nausea or vomiting.  States that he is tried Tylenol as well as Motrin other over-the-counter medications but has not had any relief.  States that he wants a shot for pain medication  Past Medical History:  Diagnosis Date   Hep C w/o coma, chronic ([place])    Hypertension    Polysubstance abuse ([place])    History reviewed. No pertinent family history. Past Surgical History:  Procedure Laterality Date   ESOPHAGOGASTRODUODENOSCOPY (EGD) WITH PROPOFOL N/A 03/02/2019   Procedure: ESOPHAGOGASTRODUODENOSCOPY (EGD) WITH PROPOFOL;  Surgeon: Georganna Skeans, MD;  Location: [place];  Service: General;  Laterality: N/A;   IR GASTROSTOMY TUBE REMOVAL  05/04/2019   PEG PLACEMENT N/A 03/02/2019   Procedure: PERCUTANEOUS ENDOSCOPIC GASTROSTOMY (PEG) PLACEMENT;  Surgeon: Georganna Skeans, MD;  Location: [place];  Service: General;  Laterality: N/A;   PERCUTANEOUS TRACHEOSTOMY N/A 02/12/2019   Procedure: PERCUTANEOUS TRACHEOSTOMY;  Surgeon: Georganna Skeans, MD;  Location: [place];  Service: General;  Laterality: N/A;   Patient Active Problem List   Diagnosis Date Noted   Tracheostomy status ([place])    SDH (subdural hematoma) 01/23/2019   Sedative, hypnotic or anxiolytic use disorder, severe, dependence ([place]) 11/14/2015    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 Authorizing Provider  cyclobenzaprine (FLEXERIL) 10 MG tablet Take 1 tablet (10 mg total) by mouth 3 (three) times daily as needed for muscle spasms. 09/19/21  Yes Merlyn Lot, MD  Amino Acids-Protein Hydrolys (FEEDING SUPPLEMENT, PRO-STAT SUGAR FREE 64,) LIQD Place 30 mLs into feeding tube 2 (two) times daily. 03/18/19   Meuth, Brooke A, PA-C  amLODipine (NORVASC) 5 MG tablet Take 1 tablet (5 mg total) by mouth daily. 08/17/21 09/16/21  Lucrezia Starch, MD  bacitracin 500 UNIT/GM ointment Apply 1 application topically 2 (two) times daily. 08/16/21   Ward, Delice Bison, DO  clonazePAM (KLONOPIN) 1 MG disintegrating tablet Take 1 tablet (1 mg total) by mouth 2 (two) times daily as needed for seizure (and/or agitation). Patient not taking: Reported on 06/25/2021 03/18/19   Margie Billet A, PA-C  dicyclomine (BENTYL) 20 MG tablet Take 1 tablet (20 mg total) by mouth every 8 (eight) hours as needed for spasms (Abdominal cramping). 08/17/21   Lucrezia Starch, MD  folic acid (FOLVITE) 1 MG tablet Take 1 tablet (1 mg total) by mouth daily. 08/17/21   Lucrezia Starch, MD  hydrochlorothiazide (MICROZIDE) 12.5 MG  capsule Take 2 capsules (25 mg total) by mouth daily. 08/17/21   Gilles ChiquitoSmith, Zachary P, MD  ibuprofen (ADVIL) 600 MG tablet Take 1 tablet (600 mg total) by mouth every 8 (eight) hours as needed for moderate pain. 09/10/21   Shaune PollackIsaacs, Cameron, MD  lidocaine (LIDODERM) 5 % Place 1 patch onto the skin every 12 (twelve) hours. Remove & Discard patch within 12 hours or as directed  by MD 09/14/21 09/14/22  Delton PrairieSmith, Dylan, MD  metoprolol tartrate (LOPRESSOR) 25 mg/10 mL SUSP Take 10 mLs (25 mg total) by mouth 2 (two) times daily. Patient not taking: Reported on 06/25/2021 03/18/19   Carlena BjornstadMeuth, Brooke A, PA-C  Multiple Vitamin (MULTIVITAMIN WITH MINERALS) TABS tablet Take 1 tablet by mouth daily. 08/17/21   Gilles ChiquitoSmith, Zachary P, MD  naproxen (NAPROSYN) 375 MG tablet Take 1 tablet (375 mg total) by mouth 2 (two) times daily with a meal. 09/16/21   Jene EveryKinner, Robert, MD  Nutritional Supplements (FEEDING SUPPLEMENT, OSMOLITE 1.5 CAL,) LIQD Place 474 mLs into feeding tube 3 (three) times daily. 03/18/19   Meuth, Brooke A, PA-C  ondansetron (ZOFRAN-ODT) 4 MG disintegrating tablet Take 1 tablet (4 mg total) by mouth every 6 (six) hours as needed for nausea or vomiting. 08/17/21   Gilles ChiquitoSmith, Zachary P, MD  pantoprazole (PROTONIX) 20 MG tablet Take 1 tablet (20 mg total) by mouth daily. 08/17/21   Gilles ChiquitoSmith, Zachary P, MD  QUEtiapine (SEROQUEL) 100 MG tablet Take 1 tablet (100 mg total) by mouth at bedtime. Patient not taking: Reported on 06/25/2021 03/18/19   Carlena BjornstadMeuth, Brooke A, PA-C  thiamine 100 MG tablet Take 1 tablet (100 mg total) by mouth daily. 08/17/21   Gilles ChiquitoSmith, Zachary P, MD    Allergies Patient has no known allergies.    Social History Social History   Tobacco Use   Smoking status: Every Day    Packs/day: 0.50    Years: 45.00    Pack years: 22.50    Types: Cigarettes   Smokeless tobacco: Never  Vaping Use   Vaping Use: Never used  Substance Use Topics   Alcohol use: Yes    Alcohol/week: 2.0 standard drinks    Types: 2 Cans of beer per week    Comment: 12 pack per day   Drug use: Not Currently    Types: Cocaine, Marijuana    Comment: today    Review of Systems Patient denies headaches, rhinorrhea, blurry vision, numbness, shortness of breath, chest pain, edema, cough, abdominal pain, nausea, vomiting, diarrhea, dysuria, fevers, rashes or hallucinations unless otherwise stated above  in HPI. ____________________________________________   PHYSICAL EXAM:  VITAL SIGNS: Vitals:   09/19/21 0316 09/19/21 0534  BP: 109/72 (!) 135/95  Pulse: (!) 110 93  Resp: 17 18  Temp: 98.6 F (37 C)   SpO2: 95% 99%    Constitutional: Alert and oriented.  Eyes: Conjunctivae are normal.  Head: Atraumatic. Nose: No congestion/rhinnorhea. Mouth/Throat: Mucous membranes are moist.   Neck: No stridor. Painless ROM.  Cardiovascular: Normal rate, regular rhythm. Grossly normal heart sounds.  Good peripheral circulation. Respiratory: Normal respiratory effort.  No retractions. Lungs CTAB. Gastrointestinal: Soft and nontender. No distention. No abdominal bruits. No CVA tenderness. Genitourinary:  Musculoskeletal: No lower extremity tenderness nor edema.  No joint effusions. Neurologic:  Normal speech and language. No gross focal neurologic deficits are appreciated. No facial droop Skin:  Skin is warm, dry and intact. No rash noted. Psychiatric: Mood and affect are normal. Speech and behavior are normal.  ____________________________________________  LABS (all labs ordered are listed, but only abnormal results are displayed)  Results for orders placed or performed during the hospital encounter of 09/19/21 (from the past 24 hour(s))  Basic metabolic panel     Status: None   Collection Time: 09/19/21  3:17 AM  Result Value Ref Range   Sodium 138 135 - 145 mmol/L   Potassium 3.9 3.5 - 5.1 mmol/L   Chloride 105 98 - 111 mmol/L   CO2 26 22 - 32 mmol/L   Glucose, Bld 97 70 - 99 mg/dL   BUN 11 6 - 20 mg/dL   Creatinine, Ser 9.75 0.61 - 1.24 mg/dL   Calcium 9.3 8.9 - 30.0 mg/dL   GFR, Estimated >51 >10 mL/min   Anion gap 7 5 - 15  CBC     Status: Abnormal   Collection Time: 09/19/21  3:17 AM  Result Value Ref Range   WBC 5.4 4.0 - 10.5 K/uL   RBC 3.88 (L) 4.22 - 5.81 MIL/uL   Hemoglobin 13.0 13.0 - 17.0 g/dL   HCT 21.1 17.3 - 56.7 %   MCV 100.5 (H) 80.0 - 100.0 fL   MCH 33.5  26.0 - 34.0 pg   MCHC 33.3 30.0 - 36.0 g/dL   RDW 01.4 10.3 - 01.3 %   Platelets 142 (L) 150 - 400 K/uL   nRBC 0.0 0.0 - 0.2 %  Troponin I (High Sensitivity)     Status: None   Collection Time: 09/19/21  3:17 AM  Result Value Ref Range   Troponin I (High Sensitivity) 8 <18 ng/L  Troponin I (High Sensitivity)     Status: None   Collection Time: 09/19/21  5:39 AM  Result Value Ref Range   Troponin I (High Sensitivity) 8 <18 ng/L   ____________________________________________  EKG My review and personal interpretation at Time: 3:21   Indication: back pain  Rate: 105  Rhythm: sinus Axis: normal Other: rbbb, no stemi, nonspecific st abn ____________________________________________  RADIOLOGY  I personally reviewed all radiographic images ordered to evaluate for the above acute complaints and reviewed radiology reports and findings.  These findings were personally discussed with the patient.  Please see medical record for radiology report.  ____________________________________________   PROCEDURES  Procedure(s) performed:  Procedures    Critical Care performed: no ____________________________________________   INITIAL IMPRESSION / ASSESSMENT AND PLAN / ED COURSE  Pertinent labs & imaging results that were available during my care of the patient were reviewed by me and considered in my medical decision making (see chart for details).   DDX: ACS, pericarditis, esophagitis, boerhaaves, pe, dissection, pna, bronchitis, costochondritis   Tabitha Tupper is a 60 y.o. who presents to the ED with presentation as described above.  Patient clinically very well-appearing in no acute distress.  He is not tachycardic no hypoxia no respiratory symptoms.  Patient's pain is very reproducible and worsened with motion after heavy lifting.  Chest x-ray shows no evidence of pneumothorax or infiltrates no sign of cardiomegaly or CHF.  His EKG is nonischemic.  Serial enzymes are negative.   This not consistent with ACS.  Not consistent with PE he is low risk by Wells criteria and has no respiratory symptoms or hypoxia.  Does not seem consistent with dissection.  His abdominal exam is soft and benign.  Seems most consistent with musculoskeletal strain possible muscle spasm.  Does appear stable and appropriate for outpatient follow-up.  Patient agreeable plan.     The patient was evaluated in  Emergency Department today for the symptoms described in the history of present illness. He/she was evaluated in the context of the global COVID-19 pandemic, which necessitated consideration that the patient might be at risk for infection with the SARS-CoV-2 virus that causes COVID-19. Institutional protocols and algorithms that pertain to the evaluation of patients at risk for COVID-19 are in a state of rapid change based on information released by regulatory bodies including the CDC and federal and state organizations. These policies and algorithms were followed during the patient's care in the ED.  As part of my medical decision making, I reviewed the following data within the [place] notes reviewed and incorporated, Labs reviewed, notes from prior ED visits and [place] Controlled Substance Database   ____________________________________________   FINAL CLINICAL IMPRESSION(S) / ED DIAGNOSES  Final diagnoses:  Atypical chest pain      NEW MEDICATIONS STARTED DURING THIS VISIT:  New Prescriptions   CYCLOBENZAPRINE (FLEXERIL) 10 MG TABLET    Take 1 tablet (10 mg total) by mouth 3 (three) times daily as needed for muscle spasms.     Note:  This document was prepared using Dragon voice recognition software and may include unintentional dictation errors.    Merlyn Lot, MD 09/19/21 [phone]

## 2021-09-19 NOTE — ED Triage Notes (Signed)
Pt arrived via [facility] from home with c/o substernal chest pain and lower back pain since lifting heavy objects yesterday. Pt describes pain as stabbing and worse when taking deep breath.   CBG with EMS 124

## 2021-09-19 NOTE — ED Notes (Signed)
Patient discharged to home per MD order. Patient in stable condition, and deemed medically cleared by ED provider for discharge. Discharge instructions reviewed with patient/family using "Teach Back"; verbalized understanding of medication education and administration, and information about follow-up care. Denies further concerns. ° °

## 2021-09-20 ENCOUNTER — Emergency Department
Admission: EM | Admit: 2021-09-20 | Discharge: 2021-09-20 | Disposition: A | Discharge status: Home / Self Care | Payer: Medicaid Other | Source: Home / Self Care | Attending: Emergency Medicine | Admitting: Emergency Medicine

## 2021-09-20 DIAGNOSIS — G8929 Other chronic pain: Secondary | ICD-10-CM

## 2021-09-20 DIAGNOSIS — M545 Low back pain, unspecified: Secondary | ICD-10-CM

## 2021-09-20 LAB — COMPREHENSIVE METABOLIC PANEL
ALT: 65 U/L — ABNORMAL HIGH (ref 0–44)
AST: 70 U/L — ABNORMAL HIGH (ref 15–41)
Albumin: 3.7 g/dL (ref 3.5–5.0)
Alkaline Phosphatase: 62 U/L (ref 38–126)
Anion gap: 7 (ref 5–15)
BUN: 11 mg/dL (ref 6–20)
CO2: 27 mmol/L (ref 22–32)
Calcium: 9.2 mg/dL (ref 8.9–10.3)
Chloride: 101 mmol/L (ref 98–111)
Creatinine, Ser: 0.74 mg/dL (ref 0.61–1.24)
GFR, Estimated: >60 mL/min (ref >60)
Glucose, Bld: 94 mg/dL (ref 70–99)
Potassium: 4.6 mmol/L (ref 3.5–5.1)
Sodium: 135 mmol/L (ref 135–145)
Total Bilirubin: 0.6 mg/dL (ref 0.3–1.2)
Total Protein: 7 g/dL (ref 6.5–8.1)

## 2021-09-20 LAB — URINALYSIS, ROUTINE W REFLEX MICROSCOPIC
Bilirubin Urine: NEGATIVE
Glucose, UA: NEGATIVE mg/dL
Hgb urine dipstick: NEGATIVE
Ketones, ur: NEGATIVE mg/dL
Leukocytes,Ua: NEGATIVE
Nitrite: NEGATIVE
Protein, ur: NEGATIVE mg/dL
Specific Gravity, Urine: 1.01 (ref 1.005–1.030)
pH: 7 (ref 5.0–8.0)

## 2021-09-20 LAB — LIPASE, BLOOD: Lipase: 53 U/L — ABNORMAL HIGH (ref 11–51)

## 2021-09-20 LAB — TROPONIN I (HIGH SENSITIVITY)
Troponin I (High Sensitivity): 8 ng/L (ref <18)
Troponin I (High Sensitivity): 9 ng/L (ref <18)

## 2021-09-20 NOTE — ED Notes (Signed)
Pt given two sprites.

## 2021-09-20 NOTE — Discharge Instructions (Signed)
Follow-up with your primary care provider if any continued problems.  A good Rx card was given to you to help with the expense of your medication.

## 2021-09-20 NOTE — ED Provider Notes (Signed)
[facility] Emergency Department Provider Note   ____________________________________________   Event Date/Time   First MD Initiated Contact with Patient 09/20/21 [phone]     (approximate)  I have reviewed the triage vital signs and the nursing notes.   HISTORY  Chief Complaint Chest Pain and Back Pain    HPI Zebbie Ace is a 60 y.o. male presents to the ED with complaint of low back pain.  Patient states he was seen in the emergency room yesterday for chest pain after lifting heavy objects.  He states that he got an injection which did not help.  The medications that were sent to the pharmacy he has been unable to obtain due to financial reasons.  Patient states that he walked to the emergency department today.  He denies any shortness of breath or chest pain.  He reports he has chronic back problems.         Past Medical History:  Diagnosis Date   Hep C w/o coma, chronic ([facility])    Hypertension    Polysubstance abuse [facility])     Patient Active Problem List   Diagnosis Date Noted   Tracheostomy status ([facility])    SDH (subdural hematoma) 01/23/2019   Sedative, hypnotic or anxiolytic use disorder, severe, dependence ([facility]) 11/14/2015   Alcohol-induced depressive disorder with moderate or severe use disorder ([facility]) 11/14/2015   Substance induced mood disorder ([facility]) 11/03/2015   Alcohol use disorder, severe, dependence ([facility]) 10/24/2015   Alcohol withdrawal ([facility]) 10/24/2015   Stimulant use disorder ([facility]) (cocaine) 10/24/2015   Tobacco use disorder 10/24/2015   Hepatitis C 09/06/2015    Past Surgical History:  Procedure Laterality Date   ESOPHAGOGASTRODUODENOSCOPY (EGD) WITH PROPOFOL N/A 03/02/2019   Procedure: ESOPHAGOGASTRODUODENOSCOPY (EGD) WITH PROPOFOL;  Surgeon: Violeta Gelinas, MD;  [facility] ENDOSCOPY;  Service: General;  Laterality: N/A;   IR GASTROSTOMY TUBE REMOVAL  05/04/2019   PEG PLACEMENT N/A 03/02/2019   Procedure: PERCUTANEOUS  ENDOSCOPIC GASTROSTOMY (PEG) PLACEMENT;  Surgeon: Violeta Gelinas, MD;  Location: [facility] ENDOSCOPY;  Service: General;  Laterality: N/A;   PERCUTANEOUS TRACHEOSTOMY N/A 02/12/2019   Procedure: PERCUTANEOUS TRACHEOSTOMY;  Surgeon: Violeta Gelinas, MD;  Location: [facility] OR;  Service: General;  Laterality: N/A;    Prior to Admission medications   Medication Sig Start Date End Date Taking? Authorizing Provider  Amino Acids-Protein Hydrolys (FEEDING SUPPLEMENT, PRO-STAT SUGAR FREE 64,) LIQD Place 30 mLs into feeding tube 2 (two) times daily. 03/18/19   Meuth, Brooke A, PA-C  amLODipine (NORVASC) 5 MG tablet Take 1 tablet (5 mg total) by mouth daily. 08/17/21 09/16/21  Gilles Chiquito, MD  bacitracin 500 UNIT/GM ointment Apply 1 application topically 2 (two) times daily. 08/16/21   Ward, Layla Maw, DO  clonazePAM (KLONOPIN) 1 MG disintegrating tablet Take 1 tablet (1 mg total) by mouth 2 (two) times daily as needed for seizure (and/or agitation). Patient not taking: Reported on 06/25/2021 03/18/19   Carlena Bjornstad A, PA-C  cyclobenzaprine (FLEXERIL) 10 MG tablet Take 1 tablet (10 mg total) by mouth 3 (three) times daily as needed for muscle spasms. 09/19/21   Willy Eddy, MD  dicyclomine (BENTYL) 20 MG tablet Take 1 tablet (20 mg total) by mouth every 8 (eight) hours as needed for spasms (Abdominal cramping). 08/17/21   Gilles Chiquito, MD  folic acid (FOLVITE) 1 MG tablet Take 1 tablet (1 mg total) by mouth daily. 08/17/21   Gilles Chiquito, MD  hydrochlorothiazide (MICROZIDE) 12.5 MG capsule Take 2 capsules (  25 mg total) by mouth daily. 08/17/21   Gilles Chiquito, MD  ibuprofen (ADVIL) 600 MG tablet Take 1 tablet (600 mg total) by mouth every 8 (eight) hours as needed for moderate pain. 09/10/21   Shaune Pollack, MD  lidocaine (LIDODERM) 5 % Place 1 patch onto the skin every 12 (twelve) hours. Remove & Discard patch within 12 hours or as directed by MD 09/14/21 09/14/22  Delton Prairie, MD  metoprolol  tartrate (LOPRESSOR) 25 mg/10 mL SUSP Take 10 mLs (25 mg total) by mouth 2 (two) times daily. Patient not taking: Reported on 06/25/2021 03/18/19   Carlena Bjornstad A, PA-C  Multiple Vitamin (MULTIVITAMIN WITH MINERALS) TABS tablet Take 1 tablet by mouth daily. 08/17/21   Gilles Chiquito, MD  naproxen (NAPROSYN) 375 MG tablet Take 1 tablet (375 mg total) by mouth 2 (two) times daily with a meal. 09/16/21   Jene Every, MD  Nutritional Supplements (FEEDING SUPPLEMENT, OSMOLITE 1.5 CAL,) LIQD Place 474 mLs into feeding tube 3 (three) times daily. 03/18/19   Meuth, Brooke A, PA-C  ondansetron (ZOFRAN-ODT) 4 MG disintegrating tablet Take 1 tablet (4 mg total) by mouth every 6 (six) hours as needed for nausea or vomiting. 08/17/21   Gilles Chiquito, MD  pantoprazole (PROTONIX) 20 MG tablet Take 1 tablet (20 mg total) by mouth daily. 08/17/21   Gilles Chiquito, MD  QUEtiapine (SEROQUEL) 100 MG tablet Take 1 tablet (100 mg total) by mouth at bedtime. Patient not taking: Reported on 06/25/2021 03/18/19   Carlena Bjornstad A, PA-C  thiamine 100 MG tablet Take 1 tablet (100 mg total) by mouth daily. 08/17/21   Gilles Chiquito, MD    Allergies Patient has no known allergies.  History reviewed. No pertinent family history.  Social History Social History   Tobacco Use   Smoking status: Every Day    Packs/day: 0.50    Years: 45.00    Pack years: 22.50    Types: Cigarettes   Smokeless tobacco: Never  Vaping Use   Vaping Use: Never used  Substance Use Topics   Alcohol use: Yes    Alcohol/week: 2.0 standard drinks    Types: 2 Cans of beer per week    Comment: 1 beer 09/19/21 around 1600   Drug use: Not Currently    Types: Cocaine, Marijuana    Comment: today    Review of Systems Constitutional: No fever/chills Eyes: No visual changes. ENT: No sore throat. Cardiovascular: Positive for chest pain. Respiratory: Denies shortness of breath. Gastrointestinal: No abdominal pain.  No nausea, no  vomiting.  No diarrhea.   Genitourinary: Negative for dysuria. Musculoskeletal: Positive for low back pain. Skin: Negative for rash. Neurological: Negative for headaches, focal weakness or numbness. Psychiatric: History of polysubstance abuse.   ____________________________________________   PHYSICAL EXAM:  VITAL SIGNS: ED Triage Vitals  Enc Vitals Group     BP 09/19/21 2000 (!) 127/92     Pulse Rate 09/19/21 2000 (!) 105     Resp 09/19/21 2000 18     Temp 09/19/21 2000 98.4 F (36.9 C)     Temp Source 09/19/21 2000 Oral     SpO2 09/19/21 2000 95 %     Weight 09/19/21 2000 145 lb (65.8 kg)     Height 09/19/21 2000 5\' 8"  (1.727 m)     Head Circumference --      Peak Flow --      Pain Score 09/19/21 2000 10     Pain Loc --  Pain Edu? --      Excl. in GC? --     Constitutional: Alert and oriented. Well appearing and in no acute distress.  Patient is sitting in a chair watching TV and does not appear to be in any acute distress. Eyes: Conjunctivae are normal.  Head: Atraumatic. Neck: No stridor.   Cardiovascular: Normal rate, regular rhythm. Grossly normal heart sounds.  Good peripheral circulation. Respiratory: Normal respiratory effort.  No retractions. Lungs CTAB. Gastrointestinal: Soft and nontender. No distention.  No CVA tenderness. Musculoskeletal: Diffuse tenderness noted on palpation lower lumbar and sacral area.  No gross deformity.  Patient is able to stand and walk without any assistance. Neurologic:  Normal speech and language. No gross focal neurologic deficits are appreciated.  Normal gait was noted. Skin:  Skin is warm, dry and intact. No rash noted. Psychiatric: Mood and affect are normal. Speech and behavior are normal.  ____________________________________________   LABS (all labs ordered are listed, but only abnormal results are displayed)  Labs Reviewed  CBC - Abnormal; Notable for the following components:      Result Value   RBC 3.52 (*)     Hemoglobin 12.1 (*)    HCT 35.4 (*)    MCV 100.6 (*)    MCH 34.4 (*)    Platelets 132 (*)    All other components within normal limits  COMPREHENSIVE METABOLIC PANEL - Abnormal; Notable for the following components:   AST 70 (*)    ALT 65 (*)    All other components within normal limits  LIPASE, BLOOD - Abnormal; Notable for the following components:   Lipase 53 (*)    All other components within normal limits  URINALYSIS, ROUTINE W REFLEX MICROSCOPIC - Abnormal; Notable for the following components:   Color, Urine YELLOW (*)    APPearance CLEAR (*)    All other components within normal limits  TROPONIN I (HIGH SENSITIVITY)  TROPONIN I (HIGH SENSITIVITY)     PROCEDURES  Procedure(s) performed (including Critical Care):  Procedures   ____________________________________________   INITIAL IMPRESSION / ASSESSMENT AND PLAN / ED COURSE  As part of my medical decision making, I reviewed the following data within the electronic MEDICAL RECORD NUMBER Notes from prior ED visits and [place] Controlled Substance Database  60 year old male presents to the ED with history of the same as he was seen yesterday.  Patient has a history of chronic low back pain and was prescribed cyclobenzaprine 10 mg 3 times daily which she has not yet picked up.  Patient was given Toradol 30 mg IM and the cyclobenzaprine 10 mg yesterday.  Patient reports that he walked to the ED today and that the injection and the pill that he was given in the emergency department did not help with his pain at all.  He also reports that he has financial problems picking up his medication but does not have any plans to do so and that it did not help him.  He states he is wasting his time because he will not get what he wanted.  After examination and prior to discharge patient eloped from the room.  He was noted to be walking down the hallway as he left without any difficulty or  limping.   ____________________________________________   FINAL CLINICAL IMPRESSION(S) / ED DIAGNOSES  Final diagnoses:  Chronic bilateral low back pain without sciatica     ED Discharge Orders     None        Note:  This document was prepared using Dragon voice recognition software and may include unintentional dictation errors.    Tommi Rumps, PA-C 09/20/21 1618    Gilles Chiquito, MD 09/20/21 [phone]

## 2021-09-24 ENCOUNTER — Other Ambulatory Visit: Payer: Self-pay

## 2021-09-24 DIAGNOSIS — M545 Low back pain, unspecified: Secondary | ICD-10-CM | POA: Diagnosis not present

## 2021-09-24 DIAGNOSIS — R531 Weakness: Secondary | ICD-10-CM | POA: Insufficient documentation

## 2021-09-24 DIAGNOSIS — R112 Nausea with vomiting, unspecified: Secondary | ICD-10-CM | POA: Insufficient documentation

## 2021-09-24 DIAGNOSIS — R1084 Generalized abdominal pain: Secondary | ICD-10-CM | POA: Insufficient documentation

## 2021-09-24 DIAGNOSIS — R197 Diarrhea, unspecified: Secondary | ICD-10-CM | POA: Diagnosis not present

## 2021-09-24 NOTE — ED Triage Notes (Signed)
Pt presents to ER c/o back pain, leg pain and rib pain x3-4 weeks.  Pt seen here for same recently.  Pt states pain goes from bottom of back all the way up into his neck.  Pt states pain is worse when bending over.  Pt states leg weakness has been getting worse and pooped on himself and has had a few times where he had less bladder control.  Pt A&O x4 at this time in NAD.

## 2021-09-25 ENCOUNTER — Emergency Department: Payer: Medicaid Other

## 2021-09-25 ENCOUNTER — Emergency Department
Admission: EM | Admit: 2021-09-25 | Discharge: 2021-09-25 | Disposition: A | Discharge status: Home / Self Care | Payer: Medicaid Other | Attending: Emergency Medicine | Admitting: Emergency Medicine

## 2021-09-25 ENCOUNTER — Encounter: Payer: Self-pay | Admitting: Radiology

## 2021-09-25 DIAGNOSIS — M5441 Lumbago with sciatica, right side: Secondary | ICD-10-CM

## 2021-09-25 DIAGNOSIS — R109 Unspecified abdominal pain: Secondary | ICD-10-CM

## 2021-09-25 LAB — URINALYSIS, ROUTINE W REFLEX MICROSCOPIC
Bilirubin Urine: NEGATIVE
Glucose, UA: NEGATIVE mg/dL
Hgb urine dipstick: NEGATIVE
Ketones, ur: NEGATIVE mg/dL
Leukocytes,Ua: NEGATIVE
Nitrite: NEGATIVE
Protein, ur: NEGATIVE mg/dL
Specific Gravity, Urine: 1.024 (ref 1.005–1.030)
pH: 6 (ref 5.0–8.0)

## 2021-09-25 LAB — COMPREHENSIVE METABOLIC PANEL
ALT: 53 U/L — ABNORMAL HIGH (ref 0–44)
AST: 67 U/L — ABNORMAL HIGH (ref 15–41)
Albumin: 3.9 g/dL (ref 3.5–5.0)
Alkaline Phosphatase: 73 U/L (ref 38–126)
Anion gap: 8 (ref 5–15)
BUN: 8 mg/dL (ref 6–20)
CO2: 25 mmol/L (ref 22–32)
Calcium: 9.2 mg/dL (ref 8.9–10.3)
Chloride: 100 mmol/L (ref 98–111)
Creatinine, Ser: 0.61 mg/dL (ref 0.61–1.24)
GFR, Estimated: >60 mL/min (ref >60)
Glucose, Bld: 90 mg/dL (ref 70–99)
Potassium: 4.5 mmol/L (ref 3.5–5.1)
Sodium: 133 mmol/L — ABNORMAL LOW (ref 135–145)
Total Bilirubin: 1.1 mg/dL (ref 0.3–1.2)
Total Protein: 7.6 g/dL (ref 6.5–8.1)

## 2021-09-25 LAB — CBC
HCT: 39.1 % (ref 39.0–52.0)
Hemoglobin: 13.3 g/dL (ref 13.0–17.0)
MCH: 33.8 pg (ref 26.0–34.0)
MCHC: 34 g/dL (ref 30.0–36.0)
MCV: 99.5 fL (ref 80.0–100.0)
Platelets: 147 10*3/uL — ABNORMAL LOW (ref 150–400)
RBC: 3.93 MIL/uL — ABNORMAL LOW (ref 4.22–5.81)
RDW: 12.6 % (ref 11.5–15.5)
WBC: 6.1 10*3/uL (ref 4.0–10.5)
nRBC: 0 % (ref 0.0–0.2)

## 2021-09-25 LAB — LIPASE, BLOOD: Lipase: 42 U/L (ref 11–51)

## 2021-09-25 MED ORDER — MORPHINE SULFATE (PF) 4 MG/ML IV SOLN
4.0000 mg | Freq: Once | INTRAVENOUS | Status: AC
Start: 2021-09-25 — End: 2021-09-25
  Administered 2021-09-25: 4 mg via INTRAVENOUS
  Filled 2021-09-25: qty 1

## 2021-09-25 MED ORDER — PREDNISONE 10 MG PO TABS: 10.0000 mg | ORAL_TABLET | Freq: Every day | ORAL | 0 refills | Status: DC

## 2021-09-25 MED ORDER — ONDANSETRON HCL 4 MG/2ML IJ SOLN
4.0000 mg | Freq: Once | INTRAMUSCULAR | Status: AC
  Administered 2021-09-25: 4 mg via INTRAVENOUS
  Filled 2021-09-25: qty 2

## 2021-09-25 MED ORDER — IOHEXOL 300 MG/ML  SOLN
100.0000 mL | Freq: Once | INTRAMUSCULAR | Status: AC | PRN
  Administered 2021-09-25: 100 mL via INTRAVENOUS
  Filled 2021-09-25: qty 100

## 2021-09-25 MED ORDER — DEXAMETHASONE SODIUM PHOSPHATE 10 MG/ML IJ SOLN
10.0000 mg | Freq: Once | INTRAMUSCULAR | Status: AC
  Administered 2021-09-25: 10 mg via INTRAVENOUS
  Filled 2021-09-25: qty 1

## 2021-09-25 MED ORDER — OXYCODONE-ACETAMINOPHEN 5-325 MG PO TABS: 1.0000 | ORAL_TABLET | ORAL | 0 refills | Status: DC | PRN

## 2021-09-25 MED ORDER — SODIUM CHLORIDE 0.9 % IV BOLUS
1000.0000 mL | Freq: Once | INTRAVENOUS | Status: AC
Start: 2021-09-25 — End: 2021-09-25
  Administered 2021-09-25: 1000 mL via INTRAVENOUS

## 2021-09-25 NOTE — ED Notes (Signed)
See triage note  presents with pain from neck into lower back and legs  states he had a fall about 3 months ago injury to neck at that time

## 2021-09-25 NOTE — ED Provider Notes (Signed)
[facility] Provider Note    Event Date/Time   First MD Initiated Contact with Patient 09/25/21 [phone]     (approximate)   History   Back pain, abdominal pain  HPI  Jeriel Hardwell is a 61 y.o. male with a past medical history of hepatitis C, alcohol use/abuse, presents to the emergency department with multiple complaints.  Patient states over the past month or so he has been experiencing pain throughout his body but mostly in his abdomen and lower back.  Patient states several times he is having continence issues of the bladder and bowels over the past 1 month.  Patient states weakness in his legs and feels like it is gotten somewhat worse.  Denies any numbness in the legs.  Patient also is complaining of diffuse abdominal pain x1 month.  States at times he will become nauseated and vomit.  States at times he has diarrhea.  Denies any known fever.  Denies any cough or congestion.  No shortness of breath or chest pain.     Physical Exam   Triage Vital Signs: ED Triage Vitals  Enc Vitals Group     BP 09/24/21 2223 131/80     Pulse Rate 09/24/21 2223 91     Resp 09/24/21 2223 17     Temp 09/24/21 2223 98.4 F (36.9 C)     Temp Source 09/24/21 2223 Oral     SpO2 09/24/21 2223 95 %     Weight 09/25/21 0715 144 lb 15.9 oz (65.8 kg)     Height 09/25/21 0715 5\' 6"  (1.676 m)     Head Circumference --      Peak Flow --      Pain Score 09/24/21 2222 10     Pain Loc --      Pain Edu? --      Excl. in [place]? --     Most recent vital signs: Vitals:   09/25/21 0203 09/25/21 0610  BP: (!) 143/98 (!) 153/101  Pulse: 85 89  Resp: 16 16  Temp: 98 F (36.7 C)   SpO2: 98% 93%     General: Awake, no distress.  Lying in bed, appears well. CV:  Good peripheral perfusion.  Regular rate and rhythm around 90 bpm. Resp:  Normal effort.  Equal breath sounds bilaterally without obvious wheeze rales or rhonchi. Abd:  No distention.  Soft, patient does have mild  diffuse tenderness in all quadrants without focal tenderness identified.  No rebound guarding or distention Other:  Patient has sensation intact and equal in bilateral lower extremities.  Full strength in bilateral upper extremities however 4/5 strength in bilateral lower extremities although somewhat limited by effort.   ED Results / Procedures / Treatments   Labs (all labs ordered are listed, but only abnormal results are displayed) Labs Reviewed  CBC - Abnormal; Notable for the following components:      Result Value   RBC 3.93 (*)    Platelets 147 (*)    All other components within normal limits  COMPREHENSIVE METABOLIC PANEL - Abnormal; Notable for the following components:   Sodium 133 (*)    AST 67 (*)    ALT 53 (*)    All other components within normal limits  URINALYSIS, ROUTINE W REFLEX MICROSCOPIC    RADIOLOGY MRI shows degenerative changes and scoliosis.  Does show moderate spinal stenosis at L2/L3. I personally reviewed the CT images.  No significant acute findings on my examination, radiology read  pending. CT scan per radiology shows no acute abnormality.    MEDICATIONS ORDERED IN ED: Medications  ondansetron (ZOFRAN) injection 4 mg (4 mg Intravenous Given 09/25/21 0746)  morphine 4 MG/ML injection 4 mg (4 mg Intravenous Given 09/25/21 0746)  sodium chloride 0.9 % bolus 1,000 mL (1,000 mLs Intravenous New Bag/Given 09/25/21 0746)  iohexol (OMNIPAQUE) 300 MG/ML solution 100 mL (100 mLs Intravenous Contrast Given 09/25/21 0838)     IMPRESSION / MDM / ASSESSMENT AND PLAN / ED COURSE  I reviewed the triage vital signs and the nursing notes.  Differential diagnosis includes, but is not limited to, diverticulitis, colitis, appendicitis, UTI or pancreatitis among other intra-abdominal pathology.  Given the patient was lower back pain and complains of intermittent incontinence differential would also include cauda equina.  Patient presents to the emergency department multiple  complaints including diffuse abdominal pain, also states pain all over his body.  States he has been nauseated with intermittent vomiting and diarrhea.  States he has been experiencing worsening lower back pain as well as weakness in his legs and has had several episodes of bowel incontinence over the past 1 month.  Patient does have diminished strength in bilateral lower extremities however much of this seems to be due to effort.  Patient is able to move around in the bed without issue.  Given the patient's complaint of lower back pain we will obtain an MRI of the L-spine to rule out cauda equina.  Given the patient's diffuse abdominal tenderness we will obtain CT imaging of the abdomen/pelvis.  Patient's lab work so far is nonrevealing.  Mild LFT elevation however the patient has a history of alcohol abuse.  lipase levels are pending.  CT scan and MRI are pending as well as a urinalysis.  Patient's work-up is overall reassuring.  CT scan shows no acute abnormality.  Lab work is largely reassuring.  Normal white blood cell count.  Normal urinalysis.  Mild LFT elevation however patient has a history of alcohol use.  Lipase is normal.  Given the patient's complaints of lower back pain with intermittent weakness and incontinence I will discuss the MRI results with neurosurgery.  I reviewed several the patient's old notes including most recently seen by Dr. Cari Caraway of neurosurgery, most recent note dated 05/13/2021.  Patient appears to have been seen for cervical spine fracture.  I spoke with Dr. Cari Caraway regarding the patient's MRI findings.  Does not believe this is an acute change and recommends placing the patient on steroids and he will follow-up in the office.  I spoke to the patient regarding this plan of care he is agreeable.  We will dose Decadron in the emergency department discharge on prednisone taper as well as a short course of pain medications.  We will ensure patient is able to ambulate prior  to discharge.   FINAL CLINICAL IMPRESSION(S) / ED DIAGNOSES   Abdominal pain Back pain     Note:  This document was prepared using Dragon voice recognition software and may include unintentional dictation errors.    Harvest Dark, MD 09/25/21 1110

## 2021-09-25 NOTE — ED Notes (Signed)
Pt ambulated down hallway to bathroom. Steady gait noted. No sign of acute distress.

## 2021-09-25 NOTE — ED Provider Notes (Deleted)
.  Reola Calkins, MD 09/25/21 [phone]

## 2021-09-30 ENCOUNTER — Emergency Department: Payer: Medicaid Other

## 2021-09-30 ENCOUNTER — Other Ambulatory Visit: Payer: Self-pay

## 2021-09-30 ENCOUNTER — Emergency Department
Admission: EM | Admit: 2021-09-30 | Discharge: 2021-09-30 | Disposition: A | Discharge status: Home / Self Care | Payer: Medicaid Other | Attending: Emergency Medicine | Admitting: Emergency Medicine

## 2021-09-30 ENCOUNTER — Encounter: Payer: Self-pay | Admitting: Emergency Medicine

## 2021-09-30 ENCOUNTER — Encounter: Payer: Self-pay | Admitting: Radiology

## 2021-09-30 DIAGNOSIS — R0789 Other chest pain: Secondary | ICD-10-CM | POA: Diagnosis present

## 2021-09-30 DIAGNOSIS — R519 Headache, unspecified: Secondary | ICD-10-CM | POA: Diagnosis not present

## 2021-09-30 DIAGNOSIS — W010XXA Fall on same level from slipping, tripping and stumbling without subsequent striking against object, initial encounter: Secondary | ICD-10-CM | POA: Insufficient documentation

## 2021-09-30 DIAGNOSIS — G8929 Other chronic pain: Secondary | ICD-10-CM | POA: Diagnosis not present

## 2021-09-30 DIAGNOSIS — Z5900 Homelessness unspecified: Secondary | ICD-10-CM | POA: Diagnosis not present

## 2021-09-30 DIAGNOSIS — R079 Chest pain, unspecified: Secondary | ICD-10-CM | POA: Insufficient documentation

## 2021-09-30 DIAGNOSIS — M549 Dorsalgia, unspecified: Secondary | ICD-10-CM | POA: Diagnosis not present

## 2021-09-30 DIAGNOSIS — I10 Essential (primary) hypertension: Secondary | ICD-10-CM | POA: Diagnosis not present

## 2021-09-30 DIAGNOSIS — M545 Low back pain, unspecified: Secondary | ICD-10-CM | POA: Insufficient documentation

## 2021-09-30 LAB — COMPREHENSIVE METABOLIC PANEL
ALT: 51 U/L — ABNORMAL HIGH (ref 0–44)
AST: 58 U/L — ABNORMAL HIGH (ref 15–41)
Albumin: 3.8 g/dL (ref 3.5–5.0)
Alkaline Phosphatase: 70 U/L (ref 38–126)
Anion gap: 10 (ref 5–15)
BUN: 9 mg/dL (ref 6–20)
CO2: 25 mmol/L (ref 22–32)
Calcium: 9.6 mg/dL (ref 8.9–10.3)
Chloride: 99 mmol/L (ref 98–111)
Creatinine, Ser: 0.64 mg/dL (ref 0.61–1.24)
GFR, Estimated: >60 mL/min (ref >60)
Glucose, Bld: 95 mg/dL (ref 70–99)
Potassium: 4.6 mmol/L (ref 3.5–5.1)
Sodium: 134 mmol/L — ABNORMAL LOW (ref 135–145)
Total Bilirubin: 0.6 mg/dL (ref 0.3–1.2)
Total Protein: 7.4 g/dL (ref 6.5–8.1)

## 2021-09-30 LAB — URINALYSIS, ROUTINE W REFLEX MICROSCOPIC
Bilirubin Urine: NEGATIVE
Glucose, UA: NEGATIVE mg/dL
Hgb urine dipstick: NEGATIVE
Ketones, ur: NEGATIVE mg/dL
Leukocytes,Ua: NEGATIVE
Nitrite: NEGATIVE
Protein, ur: NEGATIVE mg/dL
Specific Gravity, Urine: 1.001 — ABNORMAL LOW (ref 1.005–1.030)
pH: 5 (ref 5.0–8.0)

## 2021-09-30 LAB — CBC WITH DIFFERENTIAL/PLATELET
Abs Immature Granulocytes: 0.09 10*3/uL — ABNORMAL HIGH (ref 0.00–0.07)
Basophils Absolute: 0 10*3/uL (ref 0.0–0.1)
Basophils Relative: 1 %
Eosinophils Absolute: 0.1 10*3/uL (ref 0.0–0.5)
Eosinophils Relative: 1 %
HCT: 40 % (ref 39.0–52.0)
Hemoglobin: 13.8 g/dL (ref 13.0–17.0)
Immature Granulocytes: 1 %
Lymphocytes Relative: 24 %
Lymphs Abs: 1.9 10*3/uL (ref 0.7–4.0)
MCH: 34.2 pg — ABNORMAL HIGH (ref 26.0–34.0)
MCHC: 34.5 g/dL (ref 30.0–36.0)
MCV: 99.3 fL (ref 80.0–100.0)
Monocytes Absolute: 0.9 10*3/uL (ref 0.1–1.0)
Monocytes Relative: 11 %
Neutro Abs: 5 10*3/uL (ref 1.7–7.7)
Neutrophils Relative %: 62 %
Platelets: 130 10*3/uL — ABNORMAL LOW (ref 150–400)
RBC: 4.03 MIL/uL — ABNORMAL LOW (ref 4.22–5.81)
RDW: 12.7 % (ref 11.5–15.5)
WBC: 7.9 10*3/uL (ref 4.0–10.5)
nRBC: 0 % (ref 0.0–0.2)

## 2021-09-30 LAB — TROPONIN I (HIGH SENSITIVITY)
Troponin I (High Sensitivity): 7 ng/L (ref <18)
Troponin I (High Sensitivity): 6 ng/L (ref <18)
Troponin I (High Sensitivity): 7 ng/L (ref <18)

## 2021-09-30 LAB — BASIC METABOLIC PANEL
Anion gap: 10 (ref 5–15)
BUN: 10 mg/dL (ref 6–20)
CO2: 23 mmol/L (ref 22–32)
Calcium: 9.1 mg/dL (ref 8.9–10.3)
Chloride: 103 mmol/L (ref 98–111)
Creatinine, Ser: 0.61 mg/dL (ref 0.61–1.24)
GFR, Estimated: >60 mL/min (ref >60)
Glucose, Bld: 101 mg/dL — ABNORMAL HIGH (ref 70–99)
Potassium: 3.7 mmol/L (ref 3.5–5.1)
Sodium: 136 mmol/L (ref 135–145)

## 2021-09-30 LAB — CBC
HCT: 39.2 % (ref 39.0–52.0)
Hemoglobin: 13.8 g/dL (ref 13.0–17.0)
MCH: 34.5 pg — ABNORMAL HIGH (ref 26.0–34.0)
MCHC: 35.2 g/dL (ref 30.0–36.0)
MCV: 98 fL (ref 80.0–100.0)
Platelets: 143 10*3/uL — ABNORMAL LOW (ref 150–400)
RBC: 4 MIL/uL — ABNORMAL LOW (ref 4.22–5.81)
RDW: 12.4 % (ref 11.5–15.5)
WBC: 7.2 10*3/uL (ref 4.0–10.5)
nRBC: 0 % (ref 0.0–0.2)

## 2021-09-30 NOTE — ED Triage Notes (Signed)
Pt presents tonight after tripping over a curb and falling and landing on his back - he endorses chronic low back pain and this only exacerbated his pain. Pt endorses CP for the last 2-3 weeks over his left side. He states that he has been seen here several times for the same sx over the last few months. Denies SOB.

## 2021-09-30 NOTE — ED Triage Notes (Signed)
Pt BIB EMS from [place] wash. Pt c/o left sided chest pain for several days. Pt was given 324 ASA, 1 spray of Nitro. 20g LAC

## 2021-09-30 NOTE — ED Notes (Signed)
Informed by Jose [name] tech that pt stated he fell tonight and struck his head and had loc. Dr. Quentin Cornwall notified, order for head ct received.

## 2021-09-30 NOTE — ED Notes (Signed)
RN to bedside. Pt had just walked back from the bathroom. Pt CAOx4 and in no acute distress.

## 2021-09-30 NOTE — ED Provider Notes (Signed)
[facility] Provider Note    Event Date/Time   First MD Initiated Contact with Patient 09/30/21 0732     (approximate)   History   Chest Pain   HPI  Jose [name] is a 61 y.o. male who presents for anterior chest pain on the left side of his chest that has been occurring for the last several days.  Patient states that he was picked up from a car wash after he complained to another patron that his chest is been hurting for days and they called EMS.  Patient states that the pain in his chest is 10/10 in severity, nonradiating, and worse with any movement or taking a deep breath.  Patient also has chronic back pain that he states he is more concerned about at this point and is asking for IV morphine to control this pain.     Physical Exam   Triage Vital Signs: ED Triage Vitals  Enc Vitals Group     BP 09/30/21 0026 120/80     Pulse Rate 09/30/21 0026 98     Resp 09/30/21 0026 18     Temp 09/30/21 0026 98.2 F (36.8 C)     Temp Source 09/30/21 0026 Oral     SpO2 09/30/21 0026 94 %     Weight 09/30/21 0028 145 lb (65.8 kg)     Height 09/30/21 0028 5\' 8"  (1.727 m)     Head Circumference --      Peak Flow --      Pain Score 09/30/21 0036 10     Pain Loc --      Pain Edu? --      Excl. in [place]? --     Most recent vital signs: Vitals:   09/30/21 0026 09/30/21 0600  BP: 120/80 (!) 131/91  Pulse: 98 78  Resp: 18 18  Temp: 98.2 F (36.8 C)   SpO2: 94% 96%    General: Awake, no distress.  CV:  Good peripheral perfusion.  Normal heart sounds.  Mild tenderness palpation over the left anterior chest wall Resp:  Normal effort.  Abd:  No distention.  Other:  Full-body erythema over areas that are not covered consistent with likely sunburn few   ED Results / Procedures / Treatments   Labs (all labs ordered are listed, but only abnormal results are displayed) Labs Reviewed  BASIC METABOLIC PANEL - Abnormal; Notable for the following  components:      Result Value   Glucose, Bld 101 (*)    All other components within normal limits  CBC - Abnormal; Notable for the following components:   RBC 4.00 (*)    MCH 34.5 (*)    Platelets 143 (*)    All other components within normal limits  TROPONIN I (HIGH SENSITIVITY)  TROPONIN I (HIGH SENSITIVITY)     EKG ED ECG REPORT I, 11/28/21, the attending physician, personally viewed and interpreted this ECG.  Date: 09/30/2021 EKG Time: 0033 Rate: 95 Rhythm: normal sinus rhythm QRS Axis: normal Intervals: normal ST/T Wave abnormalities: normal Narrative Interpretation: no evidence of acute ischemia   RADIOLOGY ED MD interpretation: 2 view chest x-ray shows no evidence of acute abnormalities including no pneumonia, pneumothorax, or widened mediastinum.  Agree with radiology review  Official radiology report(s): DG Chest 2 View  Result Date: 09/30/2021 CLINICAL DATA:  Chest pain EXAM: CHEST - 2 VIEW COMPARISON:  None. FINDINGS: The heart size and mediastinal contours are within normal limits.  Both lungs are clear. The visualized skeletal structures are unremarkable. IMPRESSION: No active cardiopulmonary disease. Electronically Signed   By: Deatra Robinson M.D.   On: 09/30/2021 01:02      PROCEDURES:  Critical Care performed: No  .1-3 Lead EKG Interpretation Performed by: Merwyn Katos, MD Authorized by: Merwyn Katos, MD     Interpretation: normal     ECG rate:  77   ECG rate assessment: normal     Rhythm: sinus rhythm     Ectopy: none     Conduction: normal     MEDICATIONS ORDERED IN ED: Medications - No data to display   IMPRESSION / MDM / ASSESSMENT AND PLAN / ED COURSE  I reviewed the triage vital signs and the nursing notes.                              Differential diagnosis includes, but is not limited to, ACS, Pneumothorax, Pneumonia, Pulmonary Embolus, Tamponade, Aortic Dissection  The patient is on the cardiac monitor to evaluate for  evidence of arrhythmia and/or significant heart rate changes.  Workup: ECG, CXR, CBC, BMP, Troponin Findings: ECG: No overt evidence of STEMI. No evidence of Brugadas sign, delta wave, epsilon wave, significantly prolonged QTc, or malignant arrhythmia HS Troponin: Negative x1 Other Labs unremarkable for emergent problems. CXR: Without PTX, PNA, or widened mediastinum Last Stress Test: Never Last Heart Catheterization: Never HEART Score: 4  Reassesment: Prior to discharge patients pain was controlled and they were well appearing.  Admission was considered for this patient as well as discussed given past history of similar chest pains without follow-up.  Patient continued to ask for IV narcotic medication and was told that he would not be provided with that here in the emergency department, he requested to be discharged.  I expressed to patient thoroughly that a rule out of a troponin here and EKG that looks nonischemic is not an equivalent of a stress test or cardiac catheterization therefore he will need to follow-up with the on-call cardiologist as soon as possible.  Patient expressed understanding  Disposition:  Discharge. Strict return precautions discussed with patient with full understanding. Advised patient to follow up promptly with primary care provider        FINAL CLINICAL IMPRESSION(S) / ED DIAGNOSES   Final diagnoses:  Atypical chest pain     Rx / DC Orders   ED Discharge Orders     None        Note:  This document was prepared using Dragon voice recognition software and may include unintentional dictation errors.   Merwyn Katos, MD 09/30/21 [phone]

## 2021-10-01 ENCOUNTER — Emergency Department
Admission: EM | Admit: 2021-10-01 | Discharge: 2021-10-01 | Disposition: A | Discharge status: Home / Self Care | Payer: Medicaid Other | Source: Home / Self Care | Attending: Emergency Medicine | Admitting: Emergency Medicine

## 2021-10-01 DIAGNOSIS — M549 Dorsalgia, unspecified: Secondary | ICD-10-CM

## 2021-10-01 DIAGNOSIS — W19XXXA Unspecified fall, initial encounter: Secondary | ICD-10-CM

## 2021-10-01 MED ORDER — OXYCODONE-ACETAMINOPHEN 5-325 MG PO TABS
1.0000 | ORAL_TABLET | Freq: Once | ORAL | Status: AC
  Administered 2021-10-01: 1 via ORAL
  Filled 2021-10-01: qty 1

## 2021-10-01 MED ORDER — TRAMADOL HCL 50 MG PO TABS
50.0000 mg | ORAL_TABLET | Freq: Four times a day (QID) | ORAL | 0 refills | Status: DC | PRN
Start: 2021-10-01 — End: 2021-10-07

## 2021-10-01 NOTE — ED Notes (Signed)
Pt discharged to waiting room in recliner and with blanket since patient is homeless.

## 2021-10-01 NOTE — ED Provider Notes (Signed)
[facility] Provider Note    Event Date/Time   First MD Initiated Contact with Patient 10/01/21 [phone]     (approximate)  History   Chief Complaint: Fall and Chest Pain  HPI  Jose [name] is a 61 y.o. male with a past medical history of hypertension, substance abuse, presents emergency department for low back pain after a fall.  According to the patient states he had a fall tonight falling backwards after tripping over a curb.  Patient states over the past 3 weeks he has been experiencing intermittent chest pain has been seen recently for the same.  Patient also states he is currently homeless and has nowhere to go tonight.  Physical Exam   Triage Vital Signs: ED Triage Vitals [09/30/21 2105]  Enc Vitals Group     BP (!) 144/95     Pulse Rate 99     Resp 18     Temp 98 F (36.7 C)     Temp Source Oral     SpO2 98 %     Weight 145 lb (65.8 kg)     Height 5\' 8"  (1.727 m)     Head Circumference      Peak Flow      Pain Score 10     Pain Loc      Pain Edu?      Excl. in [place]?     Most recent vital signs: Vitals:   09/30/21 2105 10/01/21 0000  BP: (!) 144/95 (!) 157/90  Pulse: 99 85  Resp: 18 (!) 85  Temp: 98 F (36.7 C)   SpO2: 98% 97%    General: Awake, no distress.  CV:  Good peripheral perfusion.  Regular rate and rhythm  Resp:  Normal effort.  Equal breath sounds bilaterally.  Abd:  No distention.  Soft, nontender.  No rebound or guarding. Other:  Good range of motion all extremities.  Moves all extremities well.   ED Results / Procedures / Treatments    RADIOLOGY  Lumbar x-ray shows no acute findings. CT scan head shows no acute abnormality.   MEDICATIONS ORDERED IN ED: Medications - No data to display   IMPRESSION / MDM / ASSESSMENT AND PLAN / ED COURSE  I reviewed the triage vital signs and the nursing notes.  Patient presents emergency department for lower back pain after he states he tripped on a curb falling  backwards hitting his lower back and possibly his head.  Denies LOC that he is aware of.  Also states he has been intermittently experiencing some chest pain as well but has been seen including yesterday for the same.  Overall patient appears well.  Good strength in all extremities.  Patient has no lower back pain due to degenerative changes.  Patient follows up with Dr. 11/29/21.  Patient's lab work is reassuring including negative troponin.  I discussed with the patient given his reassuring work-up I believe he is safe for discharge home with a short course of pain medication.  Patient is agreeable to plan but states he is homeless and has nowhere to go tonight.  I discussed with patient given the significant amount of patients boarding in the emergency department as well as waiting room patient's we are unable to keep him in the emergency department overnight but he is welcome to stay in the waiting room until morning.  Patient agreeable to plan of care.  Given the patient's reassuring work-up I believe he is safe for discharge  home.  FINAL CLINICAL IMPRESSION(S) / ED DIAGNOSES   Lower back pain Fall  Rx / DC Orders   Tramadol  Note:  This document was prepared using Dragon voice recognition software and may include unintentional dictation errors.   Minna Antis, MD 10/01/21 [phone]

## 2021-10-01 NOTE — ED Notes (Signed)
Pt presents for chest pain 10/10 - described as stabbing pain in middle of chest and reports numbness and tingling down left arm. Reports "passed out" earlier and fell- reports back pain and headache from fall.

## 2021-10-02 ENCOUNTER — Other Ambulatory Visit: Payer: Self-pay

## 2021-10-02 ENCOUNTER — Emergency Department: Payer: Medicaid Other

## 2021-10-02 ENCOUNTER — Encounter: Payer: Self-pay | Admitting: *Deleted

## 2021-10-02 DIAGNOSIS — M549 Dorsalgia, unspecified: Secondary | ICD-10-CM | POA: Diagnosis not present

## 2021-10-02 DIAGNOSIS — R079 Chest pain, unspecified: Secondary | ICD-10-CM | POA: Diagnosis present

## 2021-10-02 DIAGNOSIS — I2699 Other pulmonary embolism without acute cor pulmonale: Secondary | ICD-10-CM | POA: Diagnosis not present

## 2021-10-02 LAB — BASIC METABOLIC PANEL
Anion gap: 10 (ref 5–15)
BUN: 7 mg/dL (ref 6–20)
CO2: 25 mmol/L (ref 22–32)
Calcium: 9.6 mg/dL (ref 8.9–10.3)
Chloride: 98 mmol/L (ref 98–111)
Creatinine, Ser: 0.65 mg/dL (ref 0.61–1.24)
GFR, Estimated: >60 mL/min (ref >60)
Glucose, Bld: 86 mg/dL (ref 70–99)
Potassium: 3.7 mmol/L (ref 3.5–5.1)
Sodium: 133 mmol/L — ABNORMAL LOW (ref 135–145)

## 2021-10-02 LAB — TROPONIN I (HIGH SENSITIVITY): Troponin I (High Sensitivity): 8 ng/L (ref <18)

## 2021-10-02 LAB — CBC
HCT: 39.9 % (ref 39.0–52.0)
Hemoglobin: 14 g/dL (ref 13.0–17.0)
MCH: 35.1 pg — ABNORMAL HIGH (ref 26.0–34.0)
MCHC: 35.1 g/dL (ref 30.0–36.0)
MCV: 100 fL (ref 80.0–100.0)
Platelets: 141 10*3/uL — ABNORMAL LOW (ref 150–400)
RBC: 3.99 MIL/uL — ABNORMAL LOW (ref 4.22–5.81)
RDW: 12.6 % (ref 11.5–15.5)
WBC: 8.1 10*3/uL (ref 4.0–10.5)
nRBC: 0 % (ref 0.0–0.2)

## 2021-10-02 NOTE — ED Triage Notes (Signed)
Pt brought in via ems for chest pain.  Pt has pain for 2-3 weeks.  Pt seen here for same sx recently.  Pt is homeless. Pt alert  speech clear.

## 2021-10-03 ENCOUNTER — Emergency Department
Admission: EM | Admit: 2021-10-03 | Discharge: 2021-10-03 | Disposition: A | Discharge status: Home / Self Care | Payer: Medicaid Other | Attending: Student in an Organized Health Care Education/Training Program | Admitting: Student in an Organized Health Care Education/Training Program

## 2021-10-03 DIAGNOSIS — R079 Chest pain, unspecified: Secondary | ICD-10-CM

## 2021-10-03 LAB — TROPONIN I (HIGH SENSITIVITY): Troponin I (High Sensitivity): 7 ng/L (ref <18)

## 2021-10-03 NOTE — ED Provider Notes (Signed)
[facility] Provider Note    Event Date/Time   First MD Initiated Contact with Patient 10/03/21 [phone]     (approximate)   History   Chest Pain   HPI  Jose [name] is a 61 y.o. male with a history of homelessness as well as polysubstance abuse presents to the ER for evaluation of chest pain and back pain has been ongoing for several weeks.  Denies any specific recent injury.  His only request at this time is for 2 cans of ginger ale no ice.  Denies any new symptoms.  No other concerns.     Physical Exam   Triage Vital Signs: ED Triage Vitals  Enc Vitals Group     BP 10/02/21 2133 124/84     Pulse Rate 10/02/21 2133 97     Resp 10/02/21 2133 20     Temp 10/02/21 2133 98.2 F (36.8 C)     Temp Source 10/02/21 2133 Oral     SpO2 10/02/21 2133 98 %     Weight 10/02/21 2132 145 lb (65.8 kg)     Height 10/02/21 2132 5\' 8"  (1.727 m)     Head Circumference --      Peak Flow --      Pain Score 10/02/21 2132 10     Pain Loc --      Pain Edu? --      Excl. in [place]? --     Most recent vital signs: Vitals:   10/03/21 0401 10/03/21 0724  BP: 124/85 (!) 141/92  Pulse: (!) 111 88  Resp: 20 16  Temp:  98.2 F (36.8 C)  SpO2: 98% 99%     Constitutional: Alert  Eyes: Conjunctivae are normal.  Head: Atraumatic. Nose: No congestion/rhinnorhea. Mouth/Throat: Mucous membranes are moist.   Neck: Painless ROM.  Cardiovascular:   Good peripheral circulation. Respiratory: Normal respiratory effort.  No retractions.  Gastrointestinal: Soft and nontender.  Musculoskeletal:  no deformity Neurologic:  MAE spontaneously. No gross focal neurologic deficits are appreciated.  Skin:  Skin is warm, dry and intact. No rash noted. Psychiatric: Mood and affect are normal. Speech and behavior are normal.    ED Results / Procedures / Treatments   Labs (all labs ordered are listed, but only abnormal results are displayed) Labs Reviewed  BASIC METABOLIC  PANEL - Abnormal; Notable for the following components:      Result Value   Sodium 133 (*)    All other components within normal limits  CBC - Abnormal; Notable for the following components:   RBC 3.99 (*)    MCH 35.1 (*)    Platelets 141 (*)    All other components within normal limits  TROPONIN I (HIGH SENSITIVITY)  TROPONIN I (HIGH SENSITIVITY)     EKG  ED ECG REPORT I, Merlyn Lot, the attending physician, personally viewed and interpreted this ECG.   Date: 10/03/2021  EKG Time: 21:34  Rate: 95  Rhythm: sinus  Axis: normal  Intervals: normal qt  ST&T Change: no stemi, no depression   RADIOLOGY Please see ED Course for my review and interpretation.  I personally reviewed all radiographic images ordered to evaluate for the above acute complaints and reviewed radiology reports and findings.  These findings were personally discussed with the patient.  Please see medical record for radiology report.    PROCEDURES:  Critical Care performed: No  Procedures   MEDICATIONS ORDERED IN ED: Medications - No data to display  IMPRESSION / MDM / ASSESSMENT AND PLAN / ED COURSE  I reviewed the triage vital signs and the nursing notes.                              Differential diagnosis includes, but is not limited to, ACS, pericarditis, esophagitis, boerhaaves, pe, dissection, pna, bronchitis, costochondritis  Patient presents to the ER for evaluation of chest discomfort as described above is going for several weeks including some back pain.  This not clinically consistent with dissection.  Not consistent with PE is not hypoxic no wheezing on exam.  EKG is nonischemic and delta troponins are negative.  No anemia no significant leukocytosis.  No findings to suggest pneumonia.  On my review of chest x-ray does not show evidence of pneumothorax.  Given age hospitalization considered for cardiac observation but given his presentation as he is low risk by heart score and with  negative troponins with pain going for several weeks I think that outpatient follow-up is appropriate.  Patient agreeable to plan.      FINAL CLINICAL IMPRESSION(S) / ED DIAGNOSES   Final diagnoses:  Chest pain, unspecified type     Rx / DC Orders   ED Discharge Orders     None        Note:  This document was prepared using Dragon voice recognition software and may include unintentional dictation errors.    Merlyn Lot, MD 10/03/21 [phone]

## 2021-10-05 ENCOUNTER — Encounter: Payer: Self-pay | Admitting: *Deleted

## 2021-10-05 ENCOUNTER — Other Ambulatory Visit: Payer: Self-pay

## 2021-10-05 DIAGNOSIS — F17200 Nicotine dependence, unspecified, uncomplicated: Secondary | ICD-10-CM | POA: Diagnosis present

## 2021-10-05 DIAGNOSIS — G8929 Other chronic pain: Secondary | ICD-10-CM | POA: Diagnosis present

## 2021-10-05 DIAGNOSIS — B182 Chronic viral hepatitis C: Secondary | ICD-10-CM | POA: Diagnosis present

## 2021-10-05 DIAGNOSIS — F32A Depression, unspecified: Secondary | ICD-10-CM | POA: Diagnosis present

## 2021-10-05 DIAGNOSIS — E876 Hypokalemia: Secondary | ICD-10-CM | POA: Diagnosis present

## 2021-10-05 DIAGNOSIS — M545 Low back pain, unspecified: Secondary | ICD-10-CM | POA: Diagnosis present

## 2021-10-05 DIAGNOSIS — F419 Anxiety disorder, unspecified: Secondary | ICD-10-CM | POA: Diagnosis present

## 2021-10-05 DIAGNOSIS — D696 Thrombocytopenia, unspecified: Secondary | ICD-10-CM | POA: Diagnosis present

## 2021-10-05 DIAGNOSIS — R55 Syncope and collapse: Secondary | ICD-10-CM | POA: Diagnosis present

## 2021-10-05 DIAGNOSIS — Z7151 Drug abuse counseling and surveillance of drug abuser: Secondary | ICD-10-CM

## 2021-10-05 DIAGNOSIS — I4510 Unspecified right bundle-branch block: Secondary | ICD-10-CM | POA: Diagnosis present

## 2021-10-05 DIAGNOSIS — F1410 Cocaine abuse, uncomplicated: Secondary | ICD-10-CM | POA: Diagnosis present

## 2021-10-05 DIAGNOSIS — E871 Hypo-osmolality and hyponatremia: Secondary | ICD-10-CM | POA: Diagnosis present

## 2021-10-05 DIAGNOSIS — I2699 Other pulmonary embolism without acute cor pulmonale: Principal | ICD-10-CM | POA: Diagnosis present

## 2021-10-05 DIAGNOSIS — I10 Essential (primary) hypertension: Secondary | ICD-10-CM | POA: Diagnosis present

## 2021-10-05 DIAGNOSIS — K219 Gastro-esophageal reflux disease without esophagitis: Secondary | ICD-10-CM | POA: Diagnosis present

## 2021-10-05 DIAGNOSIS — I2694 Multiple subsegmental pulmonary emboli without acute cor pulmonale: Secondary | ICD-10-CM | POA: Diagnosis present

## 2021-10-05 DIAGNOSIS — Z79899 Other long term (current) drug therapy: Secondary | ICD-10-CM

## 2021-10-05 LAB — CBC
HCT: 38.7 % — ABNORMAL LOW (ref 39.0–52.0)
Hemoglobin: 13.6 g/dL (ref 13.0–17.0)
MCH: 34.9 pg — ABNORMAL HIGH (ref 26.0–34.0)
MCHC: 35.1 g/dL (ref 30.0–36.0)
MCV: 99.2 fL (ref 80.0–100.0)
Platelets: 127 10*3/uL — ABNORMAL LOW (ref 150–400)
RBC: 3.9 MIL/uL — ABNORMAL LOW (ref 4.22–5.81)
RDW: 12.5 % (ref 11.5–15.5)
WBC: 8.3 10*3/uL (ref 4.0–10.5)
nRBC: 0 % (ref 0.0–0.2)

## 2021-10-05 LAB — BASIC METABOLIC PANEL
Anion gap: 10 (ref 5–15)
BUN: 8 mg/dL (ref 6–20)
CO2: 22 mmol/L (ref 22–32)
Calcium: 9.2 mg/dL (ref 8.9–10.3)
Chloride: 101 mmol/L (ref 98–111)
Creatinine, Ser: 0.72 mg/dL (ref 0.61–1.24)
GFR, Estimated: >60 mL/min (ref >60)
Glucose, Bld: 111 mg/dL — ABNORMAL HIGH (ref 70–99)
Potassium: 3.4 mmol/L — ABNORMAL LOW (ref 3.5–5.1)
Sodium: 133 mmol/L — ABNORMAL LOW (ref 135–145)

## 2021-10-05 LAB — TROPONIN I (HIGH SENSITIVITY): Troponin I (High Sensitivity): 3 ng/L (ref <18)

## 2021-10-05 NOTE — ED Triage Notes (Signed)
Pt reports blacking out today and falling.  Pt has back pain and chest pain.  Pt denies etoh use.  Pt alert.

## 2021-10-06 ENCOUNTER — Emergency Department: Payer: Medicaid Other

## 2021-10-06 ENCOUNTER — Inpatient Hospital Stay
Admission: EM | Admit: 2021-10-06 | Discharge: 2021-10-07 | DRG: 176 | Disposition: A | Discharge status: Home / Self Care | Payer: Medicaid Other | Attending: Internal Medicine | Admitting: Internal Medicine

## 2021-10-06 ENCOUNTER — Inpatient Hospital Stay: Payer: Medicaid Other

## 2021-10-06 ENCOUNTER — Inpatient Hospital Stay
Admit: 2021-10-06 | Discharge: 2021-10-06 | Disposition: A | Discharge status: Home / Self Care | Payer: Medicaid Other | Attending: Family Medicine | Admitting: Family Medicine

## 2021-10-06 ENCOUNTER — Other Ambulatory Visit (HOSPITAL_COMMUNITY): Payer: Self-pay

## 2021-10-06 DIAGNOSIS — Z7151 Drug abuse counseling and surveillance of drug abuser: Secondary | ICD-10-CM | POA: Diagnosis not present

## 2021-10-06 DIAGNOSIS — I2699 Other pulmonary embolism without acute cor pulmonale: Principal | ICD-10-CM | POA: Diagnosis present

## 2021-10-06 DIAGNOSIS — Z79899 Other long term (current) drug therapy: Secondary | ICD-10-CM | POA: Diagnosis not present

## 2021-10-06 DIAGNOSIS — I4510 Unspecified right bundle-branch block: Secondary | ICD-10-CM | POA: Diagnosis present

## 2021-10-06 DIAGNOSIS — E876 Hypokalemia: Secondary | ICD-10-CM | POA: Diagnosis present

## 2021-10-06 DIAGNOSIS — G8929 Other chronic pain: Secondary | ICD-10-CM | POA: Diagnosis present

## 2021-10-06 DIAGNOSIS — K219 Gastro-esophageal reflux disease without esophagitis: Secondary | ICD-10-CM | POA: Diagnosis present

## 2021-10-06 DIAGNOSIS — F17200 Nicotine dependence, unspecified, uncomplicated: Secondary | ICD-10-CM | POA: Diagnosis present

## 2021-10-06 DIAGNOSIS — Z20822 Contact with and (suspected) exposure to covid-19: Secondary | ICD-10-CM | POA: Diagnosis not present

## 2021-10-06 DIAGNOSIS — I82409 Acute embolism and thrombosis of unspecified deep veins of unspecified lower extremity: Secondary | ICD-10-CM

## 2021-10-06 DIAGNOSIS — R55 Syncope and collapse: Secondary | ICD-10-CM

## 2021-10-06 DIAGNOSIS — E871 Hypo-osmolality and hyponatremia: Secondary | ICD-10-CM | POA: Diagnosis present

## 2021-10-06 DIAGNOSIS — F1410 Cocaine abuse, uncomplicated: Secondary | ICD-10-CM | POA: Diagnosis present

## 2021-10-06 DIAGNOSIS — I10 Essential (primary) hypertension: Secondary | ICD-10-CM | POA: Diagnosis present

## 2021-10-06 DIAGNOSIS — M545 Low back pain, unspecified: Secondary | ICD-10-CM | POA: Diagnosis present

## 2021-10-06 DIAGNOSIS — F419 Anxiety disorder, unspecified: Secondary | ICD-10-CM | POA: Diagnosis present

## 2021-10-06 DIAGNOSIS — I2694 Multiple subsegmental pulmonary emboli without acute cor pulmonale: Secondary | ICD-10-CM

## 2021-10-06 DIAGNOSIS — R079 Chest pain, unspecified: Secondary | ICD-10-CM | POA: Diagnosis present

## 2021-10-06 DIAGNOSIS — D696 Thrombocytopenia, unspecified: Secondary | ICD-10-CM | POA: Diagnosis present

## 2021-10-06 DIAGNOSIS — F32A Depression, unspecified: Secondary | ICD-10-CM | POA: Diagnosis present

## 2021-10-06 DIAGNOSIS — B182 Chronic viral hepatitis C: Secondary | ICD-10-CM | POA: Diagnosis present

## 2021-10-06 DIAGNOSIS — Z7901 Long term (current) use of anticoagulants: Secondary | ICD-10-CM | POA: Diagnosis not present

## 2021-10-06 LAB — ECHOCARDIOGRAM COMPLETE
AR max vel: 2.68 cm2
AV Area VTI: 2.73 cm2
AV Area mean vel: 2.91 cm2
AV Mean grad: 3 mmHg
AV Peak grad: 6.7 mmHg
Ao pk vel: 1.29 m/s
Area-P 1/2: 2.84 cm2
MV VTI: 2.91 cm2
S' Lateral: 2.5 cm

## 2021-10-06 LAB — URINE DRUG SCREEN, QUALITATIVE (ARMC ONLY)
Amphetamines, Ur Screen: NOT DETECTED
Barbiturates, Ur Screen: NOT DETECTED
Benzodiazepine, Ur Scrn: NOT DETECTED
Cannabinoid 50 Ng, Ur ~~LOC~~: NOT DETECTED
Cocaine Metabolite,Ur ~~LOC~~: POSITIVE — AB
MDMA (Ecstasy)Ur Screen: NOT DETECTED
Methadone Scn, Ur: NOT DETECTED
Opiate, Ur Screen: NOT DETECTED
Phencyclidine (PCP) Ur S: NOT DETECTED
Tricyclic, Ur Screen: NOT DETECTED

## 2021-10-06 LAB — ETHANOL: Alcohol, Ethyl (B): 85 mg/dL — ABNORMAL HIGH (ref <10)

## 2021-10-06 LAB — PROTIME-INR
INR: 1 (ref 0.8–1.2)
Prothrombin Time: 13.2 seconds (ref 11.4–15.2)

## 2021-10-06 LAB — HEPARIN LEVEL (UNFRACTIONATED)
Heparin Unfractionated: 0.4 IU/mL (ref 0.30–0.70)
Heparin Unfractionated: 0.29 IU/mL — ABNORMAL LOW (ref 0.30–0.70)

## 2021-10-06 LAB — D-DIMER, QUANTITATIVE: D-Dimer, Quant: 0.85 ug/mL-FEU — ABNORMAL HIGH (ref 0.00–0.50)

## 2021-10-06 LAB — APTT: aPTT: 26 seconds (ref 24–36)

## 2021-10-06 LAB — TROPONIN I (HIGH SENSITIVITY): Troponin I (High Sensitivity): 3 ng/L (ref <18)

## 2021-10-06 LAB — HIV ANTIBODY (ROUTINE TESTING W REFLEX): HIV Screen 4th Generation wRfx: NONREACTIVE

## 2021-10-06 MED ORDER — HYDROCHLOROTHIAZIDE 25 MG PO TABS
25.0000 mg | ORAL_TABLET | Freq: Every day | ORAL | Status: DC
  Administered 2021-10-06 – 2021-10-07 (×2): 25 mg via ORAL
  Filled 2021-10-06 (×2): qty 1

## 2021-10-06 MED ORDER — ACETAMINOPHEN 325 MG PO TABS
650.0000 mg | ORAL_TABLET | Freq: Four times a day (QID) | ORAL | Status: DC | PRN
  Administered 2021-10-06: 650 mg via ORAL
  Filled 2021-10-06: qty 2

## 2021-10-06 MED ORDER — OSMOLITE 1.5 CAL PO LIQD: 474.0000 mL | Freq: Three times a day (TID) | ORAL | Status: DC

## 2021-10-06 MED ORDER — THIAMINE HCL 100 MG PO TABS
100.0000 mg | ORAL_TABLET | Freq: Every day | ORAL | Status: DC
  Administered 2021-10-06 – 2021-10-07 (×2): 100 mg via ORAL
  Filled 2021-10-06 (×2): qty 1

## 2021-10-06 MED ORDER — PRO-STAT SUGAR FREE PO LIQD: 30.0000 mL | Freq: Two times a day (BID) | ORAL | Status: DC

## 2021-10-06 MED ORDER — ONDANSETRON HCL 4 MG PO TABS: 4.0000 mg | ORAL_TABLET | Freq: Four times a day (QID) | ORAL | Status: DC | PRN

## 2021-10-06 MED ORDER — ONDANSETRON HCL 4 MG/2ML IJ SOLN: 4.0000 mg | Freq: Four times a day (QID) | INTRAMUSCULAR | Status: DC | PRN

## 2021-10-06 MED ORDER — MAGNESIUM HYDROXIDE 400 MG/5ML PO SUSP: 30.0000 mL | Freq: Every day | ORAL | Status: DC | PRN

## 2021-10-06 MED ORDER — OXYCODONE HCL 5 MG PO TABS
5.0000 mg | ORAL_TABLET | ORAL | Status: DC | PRN
  Administered 2021-10-06: 5 mg via ORAL
  Filled 2021-10-06: qty 1

## 2021-10-06 MED ORDER — KETOROLAC TROMETHAMINE 30 MG/ML IJ SOLN
15.0000 mg | Freq: Four times a day (QID) | INTRAMUSCULAR | Status: DC
  Administered 2021-10-06 – 2021-10-07 (×4): 15 mg via INTRAVENOUS
  Filled 2021-10-06 (×6): qty 1

## 2021-10-06 MED ORDER — PANTOPRAZOLE SODIUM 20 MG PO TBEC
20.0000 mg | DELAYED_RELEASE_TABLET | Freq: Every day | ORAL | Status: DC
  Administered 2021-10-06 – 2021-10-07 (×2): 20 mg via ORAL
  Filled 2021-10-06 (×3): qty 1

## 2021-10-06 MED ORDER — MORPHINE SULFATE (PF) 2 MG/ML IV SOLN
2.0000 mg | INTRAVENOUS | Status: DC | PRN
  Administered 2021-10-06: 2 mg via INTRAVENOUS
  Filled 2021-10-06: qty 1

## 2021-10-06 MED ORDER — HEPARIN (PORCINE) 25000 UT/250ML-% IV SOLN
1100.0000 [IU]/h | INTRAVENOUS | Status: DC
  Administered 2021-10-06: 1000 [IU]/h via INTRAVENOUS
  Administered 2021-10-07: 1100 [IU]/h via INTRAVENOUS
  Filled 2021-10-06 (×4): qty 250

## 2021-10-06 MED ORDER — TRAZODONE HCL 50 MG PO TABS: 25.0000 mg | ORAL_TABLET | Freq: Every evening | ORAL | Status: DC | PRN

## 2021-10-06 MED ORDER — OXYCODONE-ACETAMINOPHEN 5-325 MG PO TABS
1.0000 | ORAL_TABLET | Freq: Once | ORAL | Status: AC
  Administered 2021-10-06: 1 via ORAL
  Filled 2021-10-06: qty 1

## 2021-10-06 MED ORDER — FOLIC ACID 1 MG PO TABS
1.0000 mg | ORAL_TABLET | Freq: Every day | ORAL | Status: DC
  Administered 2021-10-06 – 2021-10-07 (×2): 1 mg via ORAL
  Filled 2021-10-06 (×2): qty 1

## 2021-10-06 MED ORDER — SODIUM CHLORIDE 0.9 % IV SOLN: INTRAVENOUS | Status: DC

## 2021-10-06 MED ORDER — HYDROCHLOROTHIAZIDE 12.5 MG PO CAPS
25.0000 mg | ORAL_CAPSULE | Freq: Every day | ORAL | Status: DC
  Filled 2021-10-06: qty 2

## 2021-10-06 MED ORDER — TRAMADOL HCL 50 MG PO TABS: 50.0000 mg | ORAL_TABLET | Freq: Four times a day (QID) | ORAL | Status: DC | PRN

## 2021-10-06 MED ORDER — ACETAMINOPHEN 650 MG RE SUPP: 650.0000 mg | Freq: Four times a day (QID) | RECTAL | Status: DC | PRN

## 2021-10-06 MED ORDER — IOHEXOL 350 MG/ML SOLN
75.0000 mL | Freq: Once | INTRAVENOUS | Status: AC | PRN
  Administered 2021-10-06: 75 mL via INTRAVENOUS

## 2021-10-06 MED ORDER — ENSURE ENLIVE PO LIQD
237.0000 mL | Freq: Three times a day (TID) | ORAL | Status: DC
  Administered 2021-10-06 (×2): 237 mL via ORAL

## 2021-10-06 MED ORDER — ONDANSETRON 4 MG PO TBDP: 4.0000 mg | ORAL_TABLET | Freq: Four times a day (QID) | ORAL | Status: DC | PRN

## 2021-10-06 MED ORDER — OXYCODONE-ACETAMINOPHEN 5-325 MG PO TABS: 1.0000 | ORAL_TABLET | ORAL | Status: DC | PRN

## 2021-10-06 MED ORDER — DICYCLOMINE HCL 20 MG PO TABS
20.0000 mg | ORAL_TABLET | Freq: Three times a day (TID) | ORAL | Status: DC | PRN
  Filled 2021-10-06: qty 1

## 2021-10-06 MED ORDER — HEPARIN BOLUS VIA INFUSION
1000.0000 [IU] | Freq: Once | INTRAVENOUS | Status: AC
  Administered 2021-10-06: 1000 [IU] via INTRAVENOUS
  Filled 2021-10-06: qty 1000

## 2021-10-06 MED ORDER — LIDOCAINE 5 % EX PTCH
1.0000 | MEDICATED_PATCH | Freq: Two times a day (BID) | CUTANEOUS | Status: DC
  Filled 2021-10-06: qty 1

## 2021-10-06 MED ORDER — AMLODIPINE BESYLATE 5 MG PO TABS
5.0000 mg | ORAL_TABLET | Freq: Every day | ORAL | Status: DC
  Administered 2021-10-06 – 2021-10-07 (×2): 5 mg via ORAL
  Filled 2021-10-06 (×2): qty 1

## 2021-10-06 MED ORDER — OXYCODONE-ACETAMINOPHEN 5-325 MG PO TABS
1.0000 | ORAL_TABLET | Freq: Once | ORAL | Status: AC
Start: 2021-10-06 — End: 2021-10-06
  Administered 2021-10-06: 1 via ORAL
  Filled 2021-10-06: qty 1

## 2021-10-06 MED ORDER — CYCLOBENZAPRINE HCL 10 MG PO TABS: 10.0000 mg | ORAL_TABLET | Freq: Three times a day (TID) | ORAL | Status: DC | PRN

## 2021-10-06 MED ORDER — ADULT MULTIVITAMIN W/MINERALS CH
1.0000 | ORAL_TABLET | Freq: Every day | ORAL | Status: DC
  Administered 2021-10-06 – 2021-10-07 (×2): 1 via ORAL
  Filled 2021-10-06 (×2): qty 1

## 2021-10-06 MED ORDER — HEPARIN BOLUS VIA INFUSION
4000.0000 [IU] | Freq: Once | INTRAVENOUS | Status: AC
  Administered 2021-10-06: 4000 [IU] via INTRAVENOUS
  Filled 2021-10-06: qty 4000

## 2021-10-06 NOTE — Progress Notes (Signed)
*  PRELIMINARY RESULTS* Echocardiogram 2D Echocardiogram has been performed.  Jose [name] 10/06/2021, 12:42 PM

## 2021-10-06 NOTE — Progress Notes (Signed)
ANTICOAGULATION CONSULT NOTE   Pharmacy Consult for heparin infusion Indication: pulmonary embolus  No Known Allergies  Patient Measurements: Height: 5\' 8"  (172.7 cm) Weight: 65.8 kg (145 lb) IBW/kg (Calculated) : 68.4 Heparin Dosing Weight: 65.8 kg  Vital Signs: Temp: 98.3 F (36.8 C) (01/13 0358) Temp Source: Oral (01/13 0358) BP: 127/80 (01/13 0358) Pulse Rate: 87 (01/13 0358)  Labs: Recent Labs    10/05/21 2153 10/06/21 0207  HGB 13.6  --   HCT 38.7*  --   PLT 127*  --   CREATININE 0.72  --   TROPONINIHS 3 3    Estimated Creatinine Clearance: 92.5 mL/min (by C-G formula based on SCr of 0.72 mg/dL).   Medical History: Past Medical History:  Diagnosis Date   Hep C w/o coma, chronic (HCC)    Hypertension    Polysubstance abuse (HCC)     Assessment: Pt is a 61 yo male presenting to ED for evaluation of back pain, chest pain and syncope found with "multiple acute segmental to subsegmental pulmonary emboli."  Goal of Therapy:  Heparin level 0.3-0.7 units/ml Monitor platelets by anticoagulation protocol: Yes   Plan:  Bolus 4000 units x 1 Start heparin infusion at 1000 units/hr Check HL in 6 hr after start of infusion CBC daily while on heparin  47, PharmD, [facility] 10/06/2021 5:42 AM

## 2021-10-06 NOTE — Progress Notes (Signed)
Initial Nutrition Assessment  DOCUMENTATION CODES:   Not applicable  INTERVENTION:   -D/c Osmolite 1.5 and Prosource due to no feeding access -Ensure Enlive po TID, each supplement provides 350 kcal and 20 grams of protein  -MVI with minerals daily  -Obtain new wt  NUTRITION DIAGNOSIS:   Increased nutrient needs related to chronic illness (hepatitis) as evidenced by estimated needs.  GOAL:   Patient will meet greater than or equal to 90% of their needs  MONITOR:   PO intake, Supplement acceptance, Labs, Weight trends, Skin, I & O's  REASON FOR ASSESSMENT:   New TF    ASSESSMENT:   Master Touchet is a 61 y.o. Caucasian male with medical history significant for essential hypertension and polysubstance abuse as well as hepatitis C, who presented to the ER with acute onset of left parasternal chest pain that was sharp with associated dyspnea increasing with exertion over the last couple weeks.  He had a syncopal event and fall with subsequent low back pain.  Prior to his syncope he felt lightheaded and fell on his side.  No urinary or stool incontinence.  No witnessed seizures.  He was able to ambulate independently.  He denied any head trauma.  No paresthesias or focal muscle weakness.  He admitted to vomiting and denies any fever or chills.  He denies any radiation of his pain.  He admits to bilateral lower extremity pain without edema for the last couple weeks.  No recent travels or surgeries.  No history of clotting disorders.  No bleeding diathesis.  Pt admitted with back pain, chest pain, syncope, and PE.   03/02/19- s/p PEG placement 05/04/19- s/p PEG removal  Pt unavailable at time of visit. RD unable to obtain further nutrition-related history or complete nutrition-focused physical exam at this time.    RD pulled to pt due to home TF orders. Noted pt does not have feeding access as PEG was removed in 04/2019. TF orders d/c due to lac of access.   Pt currently on a  regular diet. No meal completion data available at this time.  Due to history of homelessness and polysubstance abuse, suspect diet of poor nutritional quality PTA.   Reviewed wt hx; pt wt has been stable over the past 4 months, but suspect this a reported wt instead of a measured wt.   Pt would greatly benefit from addition of oral nutrition supplements.   Medications reviewed and include 0.9% sodium chloride infusion @ 100 ml/hr.   Labs reviewed: Na: 133, K: 3.4.    Diet Order:   Diet Order             Diet regular Room service appropriate? Yes; Fluid consistency: Thin  Diet effective now                   EDUCATION NEEDS:   Education needs have been addressed  Skin:  Skin Assessment: Reviewed RN Assessment  Last BM:  Unknown  Height:   Ht Readings from Last 1 Encounters:  10/05/21 5\' 8"  (1.727 m)    Weight:   Wt Readings from Last 1 Encounters:  10/05/21 65.8 kg    Ideal Body Weight:  70 kg  BMI:  Body mass index is 22.05 kg/m.  Estimated Nutritional Needs:   Kcal:  2100-2300  Protein:  115-130 grams  Fluid:  > 2 L    12/03/21, RD, LDN, CDCES Registered Dietitian II Certified Diabetes Care and Education Specialist Please refer to [facility]  for RD and/or RD on-call/weekend/after hours pager

## 2021-10-06 NOTE — Progress Notes (Signed)
Brief hospitalist update note.  This is a nonbillable note.  Please see same-day H&P from Dr. Arville Care for full billable details.  Briefly, this is a 61 year old male with history of hypertension, polysubstance abuse, hepatitis C who presents to the ED with left-sided pleuritic type chest pain associated with worsening shortness of breath over the last few weeks.  Suppose it out of hospital syncopal event.  He had some prodrome of lightheadedness.  No urinary or stool incontinence.  No witnessed shaking activity.  No amnesia for the event.  No head trauma.  Presentation CT angio is positive for bilateral pulmonary embolism.  No right heart strain noted on CAT scan.  Bilateral lower extremity duplex negative for DVT.  Otherwise hemodynamically stable.  On room air.  Plan: Continue heparin GTT for today Transthoracic echocardiogram If no right heart strain plan to transition to DOAC and discharged within 24 hours If there is a right heart strain we will reach out to vascular surgery for consideration of pulmonary thrombectomy  Lolita Patella MD  No charge

## 2021-10-06 NOTE — Progress Notes (Signed)
ANTICOAGULATION CONSULT NOTE   Pharmacy Consult for heparin infusion Indication: pulmonary embolus  Patient Measurements: Height: 5\' 8"  (172.7 cm) Weight: 65.8 kg (145 lb) IBW/kg (Calculated) : 68.4 Heparin Dosing Weight: 65.8 kg  Labs: Recent Labs    10/05/21 2153 10/06/21 0207 10/06/21 0605 10/06/21 1206 10/06/21 1753  HGB 13.6  --   --   --   --   HCT 38.7*  --   --   --   --   PLT 127*  --   --   --   --   APTT  --   --  26  --   --   LABPROT  --   --  13.2  --   --   INR  --   --  1.0  --   --   HEPARINUNFRC  --   --   --  0.40 0.29*  CREATININE 0.72  --   --   --   --   TROPONINIHS 3 3  --   --   --      Estimated Creatinine Clearance: 92.5 mL/min (by C-G formula based on SCr of 0.72 mg/dL).   Medical History: Past Medical History:  Diagnosis Date   Hep C w/o coma, chronic (HCC)    Hypertension    Polysubstance abuse (HCC)     Assessment: Pt is a 61 yo male presenting to ED for evaluation of back pain, chest pain and syncope found with "multiple acute segmental to subsegmental pulmonary emboli."  Goal of Therapy:  Heparin level 0.3-0.7 units/ml Monitor platelets by anticoagulation protocol: Yes  Results Date/time  Level  Comments 1/13@1206   0.40  Therapeutic x1 @ 1000un/hr 1/13@1753   0.29  Subtherapeutic @ 1000un/hr   Plan:  --Bolus heparin 1000 units x 1 --Increase heparin infusion rate to 1100 units/hr --Re-check confirmatory HL in 6 hours --Daily CBC per protocol while on IV heparin  Kannon Baum Rodriguez-Guzman PharmD, BCPS 10/06/2021 9:56 PM

## 2021-10-06 NOTE — ED Provider Notes (Signed)
[facility] Provider Note    Event Date/Time   First MD Initiated Contact with Patient 10/06/21 [phone]     (approximate)   History   Back Pain and Fall   HPI  Laurance Heide is a 61 y.o. male  with a history of homelessness, spinal stenosis, alcohol abuse, hepatitis C, hypertension who presents for evaluation of back pain, chest pain and syncope.  Patient reports sharp constant pleuritic left-sided chest pain since November.  The pain is unchanged with ambulation or at rest.  No shortness of breath, no trauma, no wheezing, no cough.  Today patient reports that he was walking when he felt lightheaded and passed out.  He fell onto his side.  He has a history of chronic back pain.  He is complaining of low back pain which is worse since he fell.  No saddle anesthesia, lower extremity weakness or numbness, urinary or bowel incontinence or retention.  Patient is able to ambulate independently with no assistance.  He denies head trauma.  He does not take any blood thinners.  He denies neck pain or abdominal pain.       Past Medical History:  Diagnosis Date   Hep C w/o coma, chronic (HCC)    Hypertension    Polysubstance abuse (HCC)     Past Surgical History:  Procedure Laterality Date   ESOPHAGOGASTRODUODENOSCOPY (EGD) WITH PROPOFOL N/A 03/02/2019   Procedure: ESOPHAGOGASTRODUODENOSCOPY (EGD) WITH PROPOFOL;  Surgeon: Violeta Gelinas, MD;  Location: [facility] ENDOSCOPY;  Service: General;  Laterality: N/A;   IR GASTROSTOMY TUBE REMOVAL  05/04/2019   PEG PLACEMENT N/A 03/02/2019   Procedure: PERCUTANEOUS ENDOSCOPIC GASTROSTOMY (PEG) PLACEMENT;  Surgeon: Violeta Gelinas, MD;  Location: [facility] ENDOSCOPY;  Service: General;  Laterality: N/A;   PERCUTANEOUS TRACHEOSTOMY N/A 02/12/2019   Procedure: PERCUTANEOUS TRACHEOSTOMY;  Surgeon: Violeta Gelinas, MD;  Location[facility] OR;  Service: General;  Laterality: N/A;     Physical Exam   Triage Vital Signs: ED Triage Vitals [10/05/21  2150]  Enc Vitals Group     BP 121/76     Pulse Rate 91     Resp 20     Temp 97.8 F (36.6 C)     Temp Source Oral     SpO2 97 %     Weight 145 lb (65.8 kg)     Height 5\' 8"  (1.727 m)     Head Circumference      Peak Flow      Pain Score 9     Pain Loc      Pain Edu?      Excl. in GC?     Most recent vital signs: Vitals:   10/05/21 2150 10/06/21 0358  BP: 121/76 127/80  Pulse: 91 87  Resp: 20 19  Temp: 97.8 F (36.6 C) 98.3 F (36.8 C)  SpO2: 97% 97%     Constitutional: Alert and oriented. Well appearing and in no apparent distress. HEENT:      Head: Normocephalic and atraumatic.         Eyes: Conjunctivae are normal. Sclera is non-icteric.       Mouth/Throat: Mucous membranes are moist.       Neck: Supple with no signs of meningismus. Cardiovascular: Regular rate and rhythm. No murmurs, gallops, or rubs. 2+ symmetrical distal pulses are present in all extremities.  Respiratory: Normal respiratory effort. Lungs are clear to auscultation bilaterally.  Gastrointestinal: Soft, non tender, and non distended with positive bowel sounds. No rebound  or guarding. Genitourinary: No CVA tenderness. Musculoskeletal:  No edema, cyanosis, or erythema of extremities.  No midline spine tenderness Neurologic: Normal speech and language. Face is symmetric.  Intact strength and sensation x4, normal gait, 1+ DTRs bilaterally lower extremity skin: Skin is warm, dry and intact. No rash noted. Psychiatric: Mood and affect are normal. Speech and behavior are normal.  ED Results / Procedures / Treatments   Labs (all labs ordered are listed, but only abnormal results are displayed) Labs Reviewed  BASIC METABOLIC PANEL - Abnormal; Notable for the following components:      Result Value   Sodium 133 (*)    Potassium 3.4 (*)    Glucose, Bld 111 (*)    All other components within normal limits  CBC - Abnormal; Notable for the following components:   RBC 3.90 (*)    HCT 38.7 (*)    MCH  34.9 (*)    Platelets 127 (*)    All other components within normal limits  ETHANOL - Abnormal; Notable for the following components:   Alcohol, Ethyl (B) 85 (*)    All other components within normal limits  D-DIMER, QUANTITATIVE - Abnormal; Notable for the following components:   D-Dimer, Quant 0.85 (*)    All other components within normal limits  URINE DRUG SCREEN, QUALITATIVE ([facility] ONLY) - Abnormal; Notable for the following components:   Cocaine Metabolite,Ur  POSITIVE (*)    All other components within normal limits  TROPONIN I (HIGH SENSITIVITY)  TROPONIN I (HIGH SENSITIVITY)     EKG  ED ECG REPORT I, Nita Sicklearolina Laquonda Welby, the attending physician, personally viewed and interpreted this ECG.  Sinus rhythm with a right bundle branch block, rate of 83, no ST elevations or depressions.  Unchanged from prior.   RADIOLOGY I, Nita Sicklearolina Julian Askin, attending MD, have personally viewed and interpreted the images obtained during this visit as below:  CT head and lumbar spine with no acute traumatic injury   ___________________________________________________ Interpretation by Radiologist:  CT Head Wo Contrast  Result Date: 10/06/2021 CLINICAL DATA:  Head trauma, moderate-severe.  Syncope.  Fall. EXAM: CT HEAD WITHOUT CONTRAST TECHNIQUE: Contiguous axial images were obtained from the base of the skull through the vertex without intravenous contrast. RADIATION DOSE REDUCTION: This exam was performed according to the departmental dose-optimization program which includes automated exposure control, adjustment of the mA and/or kV according to patient size and/or use of iterative reconstruction technique. COMPARISON:  09/30/2021 FINDINGS: Brain: There is atrophy and chronic small vessel disease changes. No acute intracranial abnormality. Specifically, no hemorrhage, hydrocephalus, mass lesion, acute infarction, or significant intracranial injury. Vascular: No hyperdense vessel or unexpected  calcification. Skull: No acute calvarial abnormality. Sinuses/Orbits: No acute findings Other: None IMPRESSION: Atrophy, chronic microvascular disease. No acute intracranial abnormality. Electronically Signed   By: Charlett NoseKevin  Dover M.D.   On: 10/06/2021 03:13   CT Angio Chest PE W and/or Wo Contrast  Result Date: 10/06/2021 CLINICAL DATA:  Positive D-dimer.  Pulmonary embolism suspected EXAM: CT ANGIOGRAPHY CHEST WITH CONTRAST TECHNIQUE: Multidetector CT imaging of the chest was performed using the standard protocol during bolus administration of intravenous contrast. Multiplanar CT image reconstructions and MIPs were obtained to evaluate the vascular anatomy. RADIATION DOSE REDUCTION: This exam was performed according to the departmental dose-optimization program which includes automated exposure control, adjustment of the mA and/or kV according to patient size and/or use of iterative reconstruction technique. CONTRAST:  75mL OMNIPAQUE IOHEXOL 350 MG/ML SOLN COMPARISON:  Chest CT 06/25/2021 FINDINGS: Cardiovascular:  Positive for branching pulmonary artery filling defects seen in the lateral basal segment of the left lower lobe and in subsegmental branches of the right upper and lower lobes. RV to LV ratio is within normal limits at 0.9. No acute systemic arterial finding when allowing for contrast timing. Atherosclerosis. Normal heart size. No pericardial effusion. Mediastinum/Nodes: Negative for adenopathy. Lungs/Pleura: No pulmonary infarct or failure. Mild atelectasis at the lung bases. Upper Abdomen: No acute finding Musculoskeletal: Spondylosis. Review of the MIP images confirms the above findings. Critical Value/emergent results were called by telephone at the time of interpretation on 10/06/2021 at 5:20 am to provider [facility] , who verbally acknowledged these results. IMPRESSION: Multiple acute segmental to subsegmental pulmonary emboli. Negative for right heart strain or pulmonary infarct.  Electronically Signed   By: Tiburcio Pea M.D.   On: 10/06/2021 05:21   CT Lumbar Spine Wo Contrast  Result Date: 10/06/2021 CLINICAL DATA:  Initial evaluation for acute trauma, fall. EXAM: CT LUMBAR SPINE WITHOUT CONTRAST TECHNIQUE: Multidetector CT imaging of the lumbar spine was performed without intravenous contrast administration. Multiplanar CT image reconstructions were also generated. RADIATION DOSE REDUCTION: This exam was performed according to the departmental dose-optimization program which includes automated exposure control, adjustment of the mA and/or kV according to patient size and/or use of iterative reconstruction technique. COMPARISON:  Prior radiograph from 09/30/2021 and MRI from 09/25/2021. FINDINGS: Segmentation: Standard. Lowest well-formed disc space labeled the L5-S1 level. Alignment: Sigmoid scoliotic curvature of the lumbar spine. Trace retrolisthesis of L2 on L3. Appearance is stable from prior. Vertebrae: Vertebral body height maintained without acute or chronic fracture. Visualized sacrum and pelvis intact. SI joints symmetric and within normal limits. Remotely healed chronic fracture of the left posterior twelfth rib noted. Visualized ribs otherwise intact. Subcentimeter sclerotic lesion noted within the right iliac wing, likely benign. No other discrete or worrisome osseous lesions. Paraspinal and other soft tissues: Paraspinous soft tissues demonstrate no acute finding. Moderate aorto bi-iliac atherosclerotic disease. Disc levels: Moderate multilevel degenerative spondylosis and facet arthrosis seen throughout the lumbar spine, not significantly changed as compared to recent MRI from 09/25/2021 where these changes are better seen and characterized. Please refer to this recent report for a full description of these findings. IMPRESSION: 1. No acute traumatic injury within the lumbar spine. 2. Sigmoid scoliosis with moderate multilevel degenerative spondylosis and facet  arthrosis, not significantly changed as compared to recent MRI from 09/25/2021 where these changes are better seen. These changes are fully described on this recent exam, please refer to prior report for a full description of these findings. Aortic Atherosclerosis (ICD10-I70.0). Electronically Signed   By: Rise Mu M.D.   On: 10/06/2021 03:26      PROCEDURES:  Critical Care performed: Yes, see critical care procedure note(s)  .Critical Care Performed by: Nita Sickle, MD Authorized by: Nita Sickle, MD   Critical care provider statement:    Critical care time (minutes):  30   Critical care was necessary to treat or prevent imminent or life-threatening deterioration of the following conditions:  Cardiac failure, respiratory failure, circulatory failure and shock   Critical care was time spent personally by me on the following activities:  Development of treatment plan with patient or surrogate, discussions with consultants, evaluation of patient's response to treatment, examination of patient, ordering and review of laboratory studies, ordering and review of radiographic studies, ordering and performing treatments and interventions, pulse oximetry, re-evaluation of patient's condition and review of old charts   I assumed direction  of critical care for this patient from another provider in my specialty: no     Care discussed with: admitting provider      IMPRESSION / MDM / ASSESSMENT AND PLAN / ED COURSE  I reviewed the triage vital signs and the nursing notes.  89M with a history of homelessness, spinal stenosis, alcohol abuse, hepatitis C, hypertension who presents for evaluation of back pain, chest pain and syncope.   Ddx: CP/syncope: MSK, ACS, PE, dissection, pneumonia, alcohol intoxication/ gastritis  Back pain: Patient with history of chronic spinal stenosis now with a fall.  No signs of cauda equina on exam.  Exacerbation of known spinal stenosis versus acute  fracture versus MSK  Plan: CT head, CT lumbar spine, EKG, troponin, D-dimer, alcohol level, CBC, chemistry panel, COVID and flu swabs.  Monitor on telemetry for any signs of dysrhythmia.  We will give 1 Percocet for pain    MEDICATIONS GIVEN IN ED: Medications  oxyCODONE-acetaminophen (PERCOCET/ROXICET) 5-325 MG per tablet 1 tablet (has no administration in time range)  oxyCODONE-acetaminophen (PERCOCET/ROXICET) 5-325 MG per tablet 1 tablet (1 tablet Oral Given 10/06/21 0320)  iohexol (OMNIPAQUE) 350 MG/ML injection 75 mL (75 mLs Intravenous Contrast Given 10/06/21 0451)     ED COURSE: CT head and lumbar spine with no acute fractures.  Labs with no signs of significant dehydration, electrolyte derangements, alcoholic ketoacidosis, anemia, or sepsis.  EKG and troponins with no signs of ischemia.  D-dimer was elevated therefore patient was sent for CTA of the chest which was positive for acute and subacute segmental and subsegmental PEs.  We will start patient on heparin and admitted to the hospitalist service.  No signs of right heart strain.  After consultation with the hospitalist and discussion patient was accepted to their service   Consults: Hospitalist   EMR reviewed including his multiple recent visits to the ED, and all the imaging done including MRI of the spine done last week        FINAL CLINICAL IMPRESSION(S) / ED DIAGNOSES   Final diagnoses:  Syncope, unspecified syncope type  Multiple subsegmental pulmonary emboli without acute cor pulmonale (HCC)     Rx / DC Orders   ED Discharge Orders     None        Note:  This document was prepared using Dragon voice recognition software and may include unintentional dictation errors.   Don PerkingVeronese, [place], MD 10/06/21 0530

## 2021-10-06 NOTE — TOC Benefit Eligibility Note (Signed)
Patient Product/process development scientist completed.    The patient is currently admitted and upon discharge could be taking Eliquis 5 mg.  The current 30 day co-pay is, $4.00.   The patient is insured through [employer]     Roland Earl, CPhT Pharmacy Patient Advocate Specialist [facility] Patient Advocate Team Direct Number: [phone]  Fax: [phone]

## 2021-10-06 NOTE — H&P (Signed)
[place]   PATIENT NAME: Jose [name]    MR#:  [id number]  DATE OF BIRTH:  May 04, 1961  DATE OF ADMISSION:  10/06/2021  PRIMARY CARE PHYSICIAN: Pcp, No   Patient is coming from: Home  REQUESTING/REFERRING PHYSICIAN: Alfred Levins, [place]:   Chief Complaint  Patient presents with   Back Pain   Fall  Chest pain  HISTORY OF PRESENT ILLNESS:  Kandon Penning is a 61 y.o. Caucasian male with medical history significant for essential hypertension and polysubstance abuse as well as hepatitis C, who presented to the ER with acute onset of left parasternal chest pain that was sharp with associated dyspnea increasing with exertion over the last couple weeks.  He had a syncopal event and fall with subsequent low back pain.  Prior to his syncope he felt lightheaded and fell on his side.  No urinary or stool incontinence.  No witnessed seizures.  He was able to ambulate independently.  He denied any head trauma.  No paresthesias or focal muscle weakness.  He admitted to vomiting and denies any fever or chills.  He denies any radiation of his pain.  He admits to bilateral lower extremity pain without edema for the last couple weeks.  No recent travels or surgeries.  No history of clotting disorders.  No bleeding diathesis.  ED Course: When he came to the ER, vital signs were within normal.  Labs revealed mild hypokalemia and hyponatremia and CBC showed thrombocytopenia of 127.  D-dimer came back 0.85 and coagulation profile was within normal.  Urine drug screen was positive for cocaine.   EKG as reviewed by me : EKG showed normal sinus rhythm with rate of 83 with right bundle branch block. Imaging: Head CT scan revealed atrophy and chronic microvascular disease with no acute intracranial normalities.  Lumbar spine CT showed scoliosis with moderate multilevel degenerative spondylosis and facet arthrosis with no significant changes compared to an MRI on 09/25/2021 and no  acute traumatic injury.  Chest CTA revealed multiple acute segmental and subsegmental pulmonary emboli with no evidence of right heart strain.  The patient was given IV heparin bolus and drip as well as 2 p.o. Percocet.  He will be admitted to a progressive unit bed for further evaluation and management. PAST MEDICAL HISTORY:   Past Medical History:  Diagnosis Date   Hep C w/o coma, chronic ([place])    Hypertension    Polysubstance abuse ([place])     PAST SURGICAL HISTORY:   Past Surgical History:  Procedure Laterality Date   ESOPHAGOGASTRODUODENOSCOPY (EGD) WITH PROPOFOL N/A 03/02/2019   Procedure: ESOPHAGOGASTRODUODENOSCOPY (EGD) WITH PROPOFOL;  Surgeon: Georganna Skeans, MD;  Location: [facility];  Service: General;  Laterality: N/A;   IR GASTROSTOMY TUBE REMOVAL  05/04/2019   PEG PLACEMENT N/A 03/02/2019   Procedure: PERCUTANEOUS ENDOSCOPIC GASTROSTOMY (PEG) PLACEMENT;  Surgeon: Georganna Skeans, MD;  Location: [facility];  Service: General;  Laterality: N/A;   PERCUTANEOUS TRACHEOSTOMY N/A 02/12/2019   Procedure: PERCUTANEOUS TRACHEOSTOMY;  Surgeon: Georganna Skeans, MD;  Location: [facility];  Service: General;  Laterality: N/A;    SOCIAL HISTORY:   Social History   Tobacco Use   Smoking status: Every Day    Packs/day: 0.50    Years: 45.00    Pack years: 22.50    Types: Cigarettes   Smokeless tobacco: Never  Substance Use Topics   Alcohol use: Yes    Alcohol/week: 2.0 standard drinks    Types: 2 Cans of  beer per week    Comment: 1 beer 09/19/21 around 1600    FAMILY HISTORY:  No family history on file. No pertinent familial diseases. DRUG ALLERGIES:  No Known Allergies  REVIEW OF SYSTEMS:   ROS As per history of present illness. All pertinent systems were reviewed above. Constitutional, HEENT, cardiovascular, respiratory, GI, GU, musculoskeletal, neuro, psychiatric, endocrine, integumentary and hematologic systems were reviewed and are otherwise  negative/unremarkable except for positive findings mentioned above in the HPI.   MEDICATIONS AT HOME:   Prior to Admission medications   Medication Sig Start Date End Date Taking? Authorizing Provider  Amino Acids-Protein Hydrolys (FEEDING SUPPLEMENT, PRO-STAT SUGAR FREE 64,) LIQD Place 30 mLs into feeding tube 2 (two) times daily. Patient not taking: Reported on 10/06/2021 03/18/19   Margie Billet A, PA-C  amLODipine (NORVASC) 5 MG tablet Take 1 tablet (5 mg total) by mouth daily. 08/17/21 09/16/21  Lucrezia Starch, MD  bacitracin 500 UNIT/GM ointment Apply 1 application topically 2 (two) times daily. Patient not taking: Reported on 10/06/2021 08/16/21   Ward, Delice Bison, DO  clonazePAM (KLONOPIN) 1 MG disintegrating tablet Take 1 tablet (1 mg total) by mouth 2 (two) times daily as needed for seizure (and/or agitation). Patient not taking: Reported on 06/25/2021 03/18/19   Margie Billet A, PA-C  cyclobenzaprine (FLEXERIL) 10 MG tablet Take 1 tablet (10 mg total) by mouth 3 (three) times daily as needed for muscle spasms. Patient not taking: Reported on 10/06/2021 09/19/21   Merlyn Lot, MD  dicyclomine (BENTYL) 20 MG tablet Take 1 tablet (20 mg total) by mouth every 8 (eight) hours as needed for spasms (Abdominal cramping). Patient not taking: Reported on 10/06/2021 08/17/21   Lucrezia Starch, MD  folic acid (FOLVITE) 1 MG tablet Take 1 tablet (1 mg total) by mouth daily. Patient not taking: Reported on 10/06/2021 08/17/21   Lucrezia Starch, MD  hydrochlorothiazide (MICROZIDE) 12.5 MG capsule Take 2 capsules (25 mg total) by mouth daily. Patient not taking: Reported on 10/06/2021 08/17/21   Lucrezia Starch, MD  ibuprofen (ADVIL) 600 MG tablet Take 1 tablet (600 mg total) by mouth every 8 (eight) hours as needed for moderate pain. Patient not taking: Reported on 10/06/2021 09/10/21   Duffy Bruce, MD  lidocaine (LIDODERM) 5 % Place 1 patch onto the skin every 12 (twelve) hours. Remove &  Discard patch within 12 hours or as directed by MD Patient not taking: Reported on 10/06/2021 09/14/21 09/14/22  Vladimir Crofts, MD  metoprolol tartrate (LOPRESSOR) 25 mg/10 mL SUSP Take 10 mLs (25 mg total) by mouth 2 (two) times daily. Patient not taking: Reported on 06/25/2021 03/18/19   Margie Billet A, PA-C  Multiple Vitamin (MULTIVITAMIN WITH MINERALS) TABS tablet Take 1 tablet by mouth daily. Patient not taking: Reported on 10/06/2021 08/17/21   Lucrezia Starch, MD  naproxen (NAPROSYN) 375 MG tablet Take 1 tablet (375 mg total) by mouth 2 (two) times daily with a meal. Patient not taking: Reported on 10/06/2021 09/16/21   Lavonia Drafts, MD  Nutritional Supplements (FEEDING SUPPLEMENT, OSMOLITE 1.5 CAL,) LIQD Place 474 mLs into feeding tube 3 (three) times daily. Patient not taking: Reported on 10/06/2021 03/18/19   Margie Billet A, PA-C  ondansetron (ZOFRAN-ODT) 4 MG disintegrating tablet Take 1 tablet (4 mg total) by mouth every 6 (six) hours as needed for nausea or vomiting. Patient not taking: Reported on 10/06/2021 08/17/21   Lucrezia Starch, MD  oxyCODONE-acetaminophen (PERCOCET) 5-325 MG tablet Take 1 tablet  by mouth every 4 (four) hours as needed for severe pain. Patient not taking: Reported on 10/06/2021 09/25/21   Harvest Dark, MD  pantoprazole (PROTONIX) 20 MG tablet Take 1 tablet (20 mg total) by mouth daily. Patient not taking: Reported on 10/06/2021 08/17/21   Lucrezia Starch, MD  predniSONE (DELTASONE) 10 MG tablet Take 1 tablet (10 mg total) by mouth daily. Day 1-3: take 4 tablets PO daily Day 4-6: take 3 tablets PO daily Day 7-9: take 2 tablets PO daily Day 10-12: take 1 tablet PO daily Patient not taking: Reported on 10/06/2021 09/25/21   Harvest Dark, MD  QUEtiapine (SEROQUEL) 100 MG tablet Take 1 tablet (100 mg total) by mouth at bedtime. Patient not taking: Reported on 06/25/2021 03/18/19   Margie Billet A, PA-C  thiamine 100 MG tablet Take 1 tablet (100 mg total) by mouth  daily. Patient not taking: Reported on 10/06/2021 08/17/21   Lucrezia Starch, MD  traMADol (ULTRAM) 50 MG tablet Take 1 tablet (50 mg total) by mouth every 6 (six) hours as needed. Patient not taking: Reported on 10/06/2021 10/01/21   Harvest Dark, MD      VITAL SIGNS:  Blood pressure (!) 144/97, pulse 82, temperature 98.3 F (36.8 C), temperature source Oral, resp. rate 19, height 5\' 8"  (1.727 m), weight 65.8 kg, SpO2 97 %.  PHYSICAL EXAMINATION:  Physical Exam  GENERAL:  61 y.o.-year-old patient lying in the bed with no acute distress.  EYES: Pupils equal, round, reactive to light and accommodation. No scleral icterus. Extraocular muscles intact.  HEENT: Head atraumatic, normocephalic. Oropharynx and nasopharynx clear.  NECK:  Supple, no jugular venous distention. No thyroid enlargement, no tenderness.  LUNGS: Normal breath sounds bilaterally, no wheezing, rales,rhonchi or crepitation. No use of accessory muscles of respiration.  CARDIOVASCULAR: Regular rate and rhythm, S1, S2 normal. No murmurs, rubs, or gallops.  ABDOMEN: Soft, nondistended, nontender. Bowel sounds present. No organomegaly or mass.  EXTREMITIES: No pedal edema, cyanosis, or clubbing.  NEUROLOGIC: Cranial nerves II through XII are intact. Muscle strength 5/5 in all extremities. Sensation intact. Gait not checked.  PSYCHIATRIC: The patient is alert and oriented x 3.  Normal affect and good eye contact. SKIN: No obvious rash, lesion, or ulcer.   LABORATORY PANEL:   CBC Recent Labs  Lab 10/05/21 2153  WBC 8.3  HGB 13.6  HCT 38.7*  PLT 127*   ------------------------------------------------------------------------------------------------------------------  Chemistries  Recent Labs  Lab 09/30/21 2112 10/02/21 2139 10/05/21 2153  NA 134*   < > 133*  K 4.6   < > 3.4*  CL 99   < > 101  CO2 25   < > 22  GLUCOSE 95   < > 111*  BUN 9   < > 8  CREATININE 0.64   < > 0.72  CALCIUM 9.6   < > 9.2  AST 58*   --   --   ALT 51*  --   --   ALKPHOS 70  --   --   BILITOT 0.6  --   --    < > = values in this interval not displayed.   ------------------------------------------------------------------------------------------------------------------  Cardiac Enzymes No results for input(s): TROPONINI in the last 168 hours. ------------------------------------------------------------------------------------------------------------------  RADIOLOGY:  CT Head Wo Contrast  Result Date: 10/06/2021 CLINICAL DATA:  Head trauma, moderate-severe.  Syncope.  Fall. EXAM: CT HEAD WITHOUT CONTRAST TECHNIQUE: Contiguous axial images were obtained from the base of the skull through the vertex without intravenous contrast. RADIATION  DOSE REDUCTION: This exam was performed according to the departmental dose-optimization program which includes automated exposure control, adjustment of the mA and/or kV according to patient size and/or use of iterative reconstruction technique. COMPARISON:  09/30/2021 FINDINGS: Brain: There is atrophy and chronic small vessel disease changes. No acute intracranial abnormality. Specifically, no hemorrhage, hydrocephalus, mass lesion, acute infarction, or significant intracranial injury. Vascular: No hyperdense vessel or unexpected calcification. Skull: No acute calvarial abnormality. Sinuses/Orbits: No acute findings Other: None IMPRESSION: Atrophy, chronic microvascular disease. No acute intracranial abnormality. Electronically Signed   By: Rolm Baptise M.D.   On: 10/06/2021 03:13   CT Angio Chest PE W and/or Wo Contrast  Result Date: 10/06/2021 CLINICAL DATA:  Positive D-dimer.  Pulmonary embolism suspected EXAM: CT ANGIOGRAPHY CHEST WITH CONTRAST TECHNIQUE: Multidetector CT imaging of the chest was performed using the standard protocol during bolus administration of intravenous contrast. Multiplanar CT image reconstructions and MIPs were obtained to evaluate the vascular anatomy. RADIATION DOSE  REDUCTION: This exam was performed according to the departmental dose-optimization program which includes automated exposure control, adjustment of the mA and/or kV according to patient size and/or use of iterative reconstruction technique. CONTRAST:  52mL OMNIPAQUE IOHEXOL 350 MG/ML SOLN COMPARISON:  Chest CT 06/25/2021 FINDINGS: Cardiovascular: Positive for branching pulmonary artery filling defects seen in the lateral basal segment of the left lower lobe and in subsegmental branches of the right upper and lower lobes. RV to LV ratio is within normal limits at 0.9. No acute systemic arterial finding when allowing for contrast timing. Atherosclerosis. Normal heart size. No pericardial effusion. Mediastinum/Nodes: Negative for adenopathy. Lungs/Pleura: No pulmonary infarct or failure. Mild atelectasis at the lung bases. Upper Abdomen: No acute finding Musculoskeletal: Spondylosis. Review of the MIP images confirms the above findings. Critical Value/emergent results were called by telephone at the time of interpretation on 10/06/2021 at 5:20 am to provider [facility] , who verbally acknowledged these results. IMPRESSION: Multiple acute segmental to subsegmental pulmonary emboli. Negative for right heart strain or pulmonary infarct. Electronically Signed   By: Jorje Guild M.D.   On: 10/06/2021 05:21   CT Lumbar Spine Wo Contrast  Result Date: 10/06/2021 CLINICAL DATA:  Initial evaluation for acute trauma, fall. EXAM: CT LUMBAR SPINE WITHOUT CONTRAST TECHNIQUE: Multidetector CT imaging of the lumbar spine was performed without intravenous contrast administration. Multiplanar CT image reconstructions were also generated. RADIATION DOSE REDUCTION: This exam was performed according to the departmental dose-optimization program which includes automated exposure control, adjustment of the mA and/or kV according to patient size and/or use of iterative reconstruction technique. COMPARISON:  Prior radiograph from  09/30/2021 and MRI from 09/25/2021. FINDINGS: Segmentation: Standard. Lowest well-formed disc space labeled the L5-S1 level. Alignment: Sigmoid scoliotic curvature of the lumbar spine. Trace retrolisthesis of L2 on L3. Appearance is stable from prior. Vertebrae: Vertebral body height maintained without acute or chronic fracture. Visualized sacrum and pelvis intact. SI joints symmetric and within normal limits. Remotely healed chronic fracture of the left posterior twelfth rib noted. Visualized ribs otherwise intact. Subcentimeter sclerotic lesion noted within the right iliac wing, likely benign. No other discrete or worrisome osseous lesions. Paraspinal and other soft tissues: Paraspinous soft tissues demonstrate no acute finding. Moderate aorto bi-iliac atherosclerotic disease. Disc levels: Moderate multilevel degenerative spondylosis and facet arthrosis seen throughout the lumbar spine, not significantly changed as compared to recent MRI from 09/25/2021 where these changes are better seen and characterized. Please refer to this recent report for a full description of these findings. IMPRESSION:  1. No acute traumatic injury within the lumbar spine. 2. Sigmoid scoliosis with moderate multilevel degenerative spondylosis and facet arthrosis, not significantly changed as compared to recent MRI from 09/25/2021 where these changes are better seen. These changes are fully described on this recent exam, please refer to prior report for a full description of these findings. Aortic Atherosclerosis (ICD10-I70.0). Electronically Signed   By: Jeannine Boga M.D.   On: 10/06/2021 03:26      IMPRESSION AND PLAN:  Principal Problem:   Acute pulmonary embolism ([place])  1.  Bilateral acute segmental and subsegmental pulmonary embolism with pleuritic chest pain. - The patient will be admitted to a PCU bed. - We will continue on IV heparin. - We will manage his pleuritic chest pain with as needed IV Toradol. - We will  obtain 2D echo to assess for RV strain. - We will obtain bilateral lower extremity venous Doppler.  2.  Mild hypokalemia and hyponatremia. - The patient will be hydrated with IV normal saline and potassium will be replaced.  3.  Essential hypertension. - We will continue amlodipine, Lopressor and HCTZ.  4.  History of polysubstance abuse and positive cocaine on urine drug screen. - He was counseled for cessation. - Cocaine certainly could be contributing to his chest pain.  5.  Anxiety/depression. - We will continue Klonopin and Seroquel.  6.  GERD. - We will continue PPI therapy.    DVT prophylaxis: IV heparin. Code Status: full code. Family Communication:  The plan of care was discussed in details with the patient (and family). I answered all questions. The patient agreed to proceed with the above mentioned plan. Further management will depend upon hospital course. Disposition Plan: Back to previous home environment Consults called: none. All the records are reviewed and case discussed with ED provider.  Status is: Inpatient   At the time of the admission, it appears that the appropriate admission status for this patient is inpatient.  This is judged to be reasonable and necessary in order to provide the required intensity of service to ensure the patient's safety given the presenting symptoms, physical exam findings and initial radiographic and laboratory data in the context of comorbid conditions.  The patient requires inpatient status due to high intensity of service, high risk of further deterioration and high frequency of surveillance required.  I certify that at the time of admission, it is my clinical judgment that the patient will require inpatient hospital care extending more than 2 midnights.                            Dispo: The patient is from: Home              Anticipated d/c is to: Home              Patient currently is not medically stable to d/c.               Difficult to place patient: No    Christel Mormon M.D on 10/06/2021 at 9:25 AM  [facility]   From 7 PM-7 AM, contact night-coverage [email or web address]  CC: Primary care physician; Pcp, No

## 2021-10-06 NOTE — Progress Notes (Signed)
ANTICOAGULATION CONSULT NOTE   Pharmacy Consult for heparin infusion Indication: pulmonary embolus  Patient Measurements: Height: 5\' 8"  (172.7 cm) Weight: 65.8 kg (145 lb) IBW/kg (Calculated) : 68.4 Heparin Dosing Weight: 65.8 kg  Labs: Recent Labs    10/05/21 2153 10/06/21 0207 10/06/21 0605 10/06/21 1206  HGB 13.6  --   --   --   HCT 38.7*  --   --   --   PLT 127*  --   --   --   APTT  --   --  26  --   LABPROT  --   --  13.2  --   INR  --   --  1.0  --   HEPARINUNFRC  --   --   --  0.40  CREATININE 0.72  --   --   --   TROPONINIHS 3 3  --   --      Estimated Creatinine Clearance: 92.5 mL/min (by C-G formula based on SCr of 0.72 mg/dL).   Medical History: Past Medical History:  Diagnosis Date   Hep C w/o coma, chronic ([facility])    Hypertension    Polysubstance abuse ([place])     Assessment: Pt is a 61 yo male presenting to ED for evaluation of back pain, chest pain and syncope found with "multiple acute segmental to subsegmental pulmonary emboli."  Goal of Therapy:  Heparin level 0.3-0.7 units/ml Monitor platelets by anticoagulation protocol: Yes   Plan:  --Heparin level is therapeutic x 1 --Continue heparin infusion at 1000 units/hr --Re-check confirmatory HL in 6 hours --Daily CBC per protocol while on IV heparin  Benita Gutter  10/06/2021 1:17 PM

## 2021-10-07 ENCOUNTER — Emergency Department: Payer: Medicaid Other

## 2021-10-07 ENCOUNTER — Emergency Department
Admission: EM | Admit: 2021-10-07 | Discharge: 2021-10-08 | Disposition: A | Discharge status: Home / Self Care | Payer: Medicaid Other | Attending: Emergency Medicine | Admitting: Emergency Medicine

## 2021-10-07 ENCOUNTER — Other Ambulatory Visit: Payer: Self-pay

## 2021-10-07 DIAGNOSIS — I2699 Other pulmonary embolism without acute cor pulmonale: Secondary | ICD-10-CM | POA: Insufficient documentation

## 2021-10-07 DIAGNOSIS — Z20822 Contact with and (suspected) exposure to covid-19: Secondary | ICD-10-CM | POA: Diagnosis not present

## 2021-10-07 DIAGNOSIS — Z7901 Long term (current) use of anticoagulants: Secondary | ICD-10-CM | POA: Diagnosis not present

## 2021-10-07 DIAGNOSIS — R079 Chest pain, unspecified: Secondary | ICD-10-CM | POA: Diagnosis present

## 2021-10-07 LAB — CBC WITH DIFFERENTIAL/PLATELET
Abs Immature Granulocytes: 0.04 10*3/uL (ref 0.00–0.07)
Basophils Absolute: 0 10*3/uL (ref 0.0–0.1)
Basophils Relative: 0 %
Eosinophils Absolute: 0 10*3/uL (ref 0.0–0.5)
Eosinophils Relative: 1 %
HCT: 39.1 % (ref 39.0–52.0)
Hemoglobin: 13.4 g/dL (ref 13.0–17.0)
Immature Granulocytes: 1 %
Lymphocytes Relative: 19 %
Lymphs Abs: 1.2 10*3/uL (ref 0.7–4.0)
MCH: 34.3 pg — ABNORMAL HIGH (ref 26.0–34.0)
MCHC: 34.3 g/dL (ref 30.0–36.0)
MCV: 100 fL (ref 80.0–100.0)
Monocytes Absolute: 0.6 10*3/uL (ref 0.1–1.0)
Monocytes Relative: 9 %
Neutro Abs: 4.5 10*3/uL (ref 1.7–7.7)
Neutrophils Relative %: 70 %
Platelets: 140 10*3/uL — ABNORMAL LOW (ref 150–400)
RBC: 3.91 MIL/uL — ABNORMAL LOW (ref 4.22–5.81)
RDW: 12.2 % (ref 11.5–15.5)
WBC: 6.4 10*3/uL (ref 4.0–10.5)
nRBC: 0 % (ref 0.0–0.2)

## 2021-10-07 LAB — CBC
HCT: 38.6 % — ABNORMAL LOW (ref 39.0–52.0)
Hemoglobin: 13.1 g/dL (ref 13.0–17.0)
MCH: 33.2 pg (ref 26.0–34.0)
MCHC: 33.9 g/dL (ref 30.0–36.0)
MCV: 97.7 fL (ref 80.0–100.0)
Platelets: 124 10*3/uL — ABNORMAL LOW (ref 150–400)
RBC: 3.95 MIL/uL — ABNORMAL LOW (ref 4.22–5.81)
RDW: 12.5 % (ref 11.5–15.5)
WBC: 5.5 10*3/uL (ref 4.0–10.5)
nRBC: 0 % (ref 0.0–0.2)

## 2021-10-07 LAB — BASIC METABOLIC PANEL
Anion gap: 10 (ref 5–15)
Anion gap: 11 (ref 5–15)
BUN: 8 mg/dL (ref 6–20)
BUN: 12 mg/dL (ref 6–20)
CO2: 26 mmol/L (ref 22–32)
CO2: 25 mmol/L (ref 22–32)
Calcium: 9.3 mg/dL (ref 8.9–10.3)
Calcium: 9.9 mg/dL (ref 8.9–10.3)
Chloride: 99 mmol/L (ref 98–111)
Chloride: 93 mmol/L — ABNORMAL LOW (ref 98–111)
Creatinine, Ser: 0.75 mg/dL (ref 0.61–1.24)
Creatinine, Ser: 0.79 mg/dL (ref 0.61–1.24)
GFR, Estimated: >60 mL/min (ref >60)
GFR, Estimated: >60 mL/min (ref >60)
Glucose, Bld: 94 mg/dL (ref 70–99)
Glucose, Bld: 149 mg/dL — ABNORMAL HIGH (ref 70–99)
Potassium: 3.7 mmol/L (ref 3.5–5.1)
Potassium: 3.9 mmol/L (ref 3.5–5.1)
Sodium: 135 mmol/L (ref 135–145)
Sodium: 129 mmol/L — ABNORMAL LOW (ref 135–145)

## 2021-10-07 LAB — HEPARIN LEVEL (UNFRACTIONATED): Heparin Unfractionated: 0.52 IU/mL (ref 0.30–0.70)

## 2021-10-07 LAB — TROPONIN I (HIGH SENSITIVITY)
Troponin I (High Sensitivity): 6 ng/L (ref <18)
Troponin I (High Sensitivity): 4 ng/L (ref <18)

## 2021-10-07 MED ORDER — APIXABAN 5 MG PO TABS
10.0000 mg | ORAL_TABLET | ORAL | Status: AC
  Administered 2021-10-07: 10 mg via ORAL
  Filled 2021-10-07: qty 2

## 2021-10-07 MED ORDER — APIXABAN 5 MG PO TABS
10.0000 mg | ORAL_TABLET | Freq: Two times a day (BID) | ORAL | Status: DC
  Administered 2021-10-07: 10 mg via ORAL
  Filled 2021-10-07: qty 2

## 2021-10-07 MED ORDER — METHOCARBAMOL 500 MG PO TABS: 500.0000 mg | ORAL_TABLET | Freq: Four times a day (QID) | ORAL | 0 refills | Status: AC

## 2021-10-07 MED ORDER — APIXABAN 5 MG PO TABS: ORAL_TABLET | ORAL | 0 refills | Status: DC

## 2021-10-07 MED ORDER — APIXABAN 5 MG PO TABS: 5.0000 mg | ORAL_TABLET | Freq: Two times a day (BID) | ORAL | Status: DC

## 2021-10-07 MED ORDER — OXYCODONE HCL 5 MG PO TABS: 5.0000 mg | ORAL_TABLET | Freq: Four times a day (QID) | ORAL | 0 refills | Status: AC | PRN

## 2021-10-07 MED ORDER — OXYCODONE HCL 5 MG PO TABS
5.0000 mg | ORAL_TABLET | ORAL | Status: AC
Start: 2021-10-07 — End: 2021-10-07
  Administered 2021-10-07: 5 mg via ORAL
  Filled 2021-10-07: qty 1

## 2021-10-07 NOTE — TOC Transition Note (Signed)
Transition of Care [facility]) - CM/SW Discharge Note   Patient Details  Name: Jose [name] MRN: [id number] Date of Birth: September 11, 1961  [facility] of Care [facility] CM/SW Contact:  Susa Simmonds, LCSWA Phone Number: 10/07/2021, 3:28 PM   Clinical Narrative:   CSW spoke with patient about a PCP. CSW explained to patient that he should be able to contact the number on the back of his medicaid card to see if he was assigned a provider. Patient stated he did not have a card. CSW told patient he would need to speak with DSS on Monday about a card and to see if they can set him up with a PCP. CSW also told patient to try the [facility] to see if they accept medicaid. Patient agreed he would follow-up.           Patient Goals and CMS Choice        Discharge Placement                       Discharge Plan and Services                                     Social Determinants of Health (SDOH) Interventions     Readmission Risk Interventions No flowsheet data found.

## 2021-10-07 NOTE — ED Provider Notes (Signed)
[facility] Provider Note    Event Date/Time   First MD Initiated Contact with Patient 10/07/21 2301     (approximate)   History   Chest Pain   HPI  Jose [name] is a 61 y.o. male  with history of hypertension, polysubstance abuse, hepatitis C and recent admission 1/13-1/14 discharged today after being admitted for bilateral PEs with work-up showing no evidence of right heart strain or lower extremity DVT having been transitioned from heparin to Eliquis presents for assessment of recurrence of his chest and upper back pain that he states he had while in hospital and had initially gotten better but seems of gotten little worse since he left earlier today.  Patient states he has not had a chance to fill his prescriptions for his Eliquis for his oxycodone has not been out of his medication since leaving this morning.  He denies any other new symptoms including cough, fevers, headache, earache, sore throat, abdominal pain, vomiting, diarrhea, rash or extremity pain.  He denies any EtOH use since leaving hospital swelling.  Denies any other acute concerns at this time.      Physical Exam  Triage Vital Signs: ED Triage Vitals  Enc Vitals Group     BP 10/07/21 2009 136/87     Pulse Rate 10/07/21 2009 98     Resp 10/07/21 2009 20     Temp 10/07/21 2009 97.6 F (36.4 C)     Temp Source 10/07/21 2009 Oral     SpO2 10/07/21 2009 97 %     Weight 10/07/21 2010 145 lb (65.8 kg)     Height 10/07/21 2010 5\' 8"  (1.727 m)     Head Circumference --      Peak Flow --      Pain Score 10/07/21 2010 10     Pain Loc --      Pain Edu? --      Excl. in [place]? --     Most recent vital signs: Vitals:   10/08/21 0030 10/08/21 0100  BP: 126/82 131/81  Pulse: 91 88  Resp: 17 18  Temp:    SpO2: 96% 96%    General: Awake, no distress.  CV:  Good peripheral perfusion.  No murmurs rubs or gallops.  2+ radial pulses.  Less than 2-second cap refill. Resp:  Normal  effort.  No tachypnea.  Clear bilaterally. Abd:  No distention.  Soft throughout. Other:  Patient is awake and alert.   ED Results / Procedures / Treatments  Labs (all labs ordered are listed, but only abnormal results are displayed) Labs Reviewed  CBC WITH DIFFERENTIAL/PLATELET - Abnormal; Notable for the following components:      Result Value   RBC 3.91 (*)    MCH 34.3 (*)    Platelets 140 (*)    All other components within normal limits  BASIC METABOLIC PANEL - Abnormal; Notable for the following components:   Sodium 129 (*)    Chloride 93 (*)    Glucose, Bld 149 (*)    All other components within normal limits  RESP PANEL BY RT-PCR (FLU A&B, COVID) ARPGX2  TROPONIN I (HIGH SENSITIVITY)  TROPONIN I (HIGH SENSITIVITY)     EKG  EKG is remarkable for sinus rhythm with a ventricular rate of 99, right bundle branch block with nonspecific ST changes in aVL V2 and V3 without other clear evidence of acute ischemia or significant arrhythmia.  This appears largely unchanged from EKG obtained yesterday  that was reviewed by myself from 1/12.   RADIOLOGY  I reviewed patient's CTA chest obtained yesterday that showed bilateral segmental to subsegmental PEs.  No evidence of pneumonia, thorax, effusion, edema or other clear acute thoracic process.  I also reviewed radiology interpretation and agree with their findings.  Chest x-ray ordered today by myself remarkable for a little bit of atelectasis but no focal consolidation, effusion, edema, pneumothorax or other clear acute thoracic process.  I also reviewed radiology interpretation and agree with their findings.   PROCEDURES:  Critical Care performed: No  .1-3 Lead EKG Interpretation Performed by: Lucrezia Starch, MD Authorized by: Lucrezia Starch, MD     Interpretation: non-specific     ECG rate assessment: tachycardic     Rhythm: sinus tachycardia     Ectopy: none     Conduction: normal    The patient is on the cardiac  monitor to evaluate for evidence of arrhythmia and/or significant heart rate changes.   MEDICATIONS ORDERED IN ED: Medications  LORazepam (ATIVAN) tablet 1-4 mg (has no administration in time range)    Or  LORazepam (ATIVAN) injection 1-4 mg (has no administration in time range)  thiamine tablet 100 mg (has no administration in time range)    Or  thiamine (B-1) injection 100 mg (has no administration in time range)  folic acid (FOLVITE) tablet 1 mg (has no administration in time range)  multivitamin with minerals tablet 1 tablet (has no administration in time range)  apixaban (ELIQUIS) tablet 10 mg (10 mg Oral Given 10/07/21 2339)  oxyCODONE (Oxy IR/ROXICODONE) immediate release tablet 5 mg (5 mg Oral Given 10/07/21 2338)  lactated ringers bolus 1,000 mL (1,000 mLs Intravenous New Bag/Given 10/08/21 0029)     IMPRESSION / MDM / ASSESSMENT AND PLAN / ED COURSE  I reviewed the triage vital signs and the nursing notes.                              Differential diagnosis includes, but is not limited to pain related to diagnosis of acute segmental and subsegmental PEs yesterday, pneumonia, pneumothorax, ACS, costochondritis with a lower suspicion for dissection at this time given patient states that his almost identical to the discomfort he had yesterday when he was diagnosed with a PE.  EKG is remarkable for sinus rhythm with a ventricular rate of 99, right bundle branch block with nonspecific ST changes in aVL V2 and V3 without other clear evidence of acute ischemia or significant arrhythmia.  This appears largely unchanged from EKG obtained yesterday that was reviewed by myself from 1/12.  Given nonelevated troponin x2 today I have a low suspicion for ACS or myocarditis.  BMP today remarkable for sodium of 129 without other significant electrolyte or metabolic derangements.  Seems patient sodium is typically around 133 and I do not believe this requires further emergent work-up emergency room.   CBC without leukocytosis or acute anemia.  Platelets are 140.  I reviewed patient's CTA chest obtained yesterday that showed bilateral segmental to subsegmental PEs.  No evidence of pneumonia, thorax, effusion, edema or other clear acute thoracic process.  I also reviewed radiology interpretation and agree with their findings.  I also reviewed patient's TTE obtained yesterday prior to discharge that showed EF of 60 to 65% with normal left ventricular function and no wall motion abnormalities.  Normal diastolic parameters were noted in the left ventricle.  Right ventricle systolic function was  noted to be normal.  The right ventricular size was slightly enlarged.  Mitral valve was noted to be normal in structure without evidence of regurgitation or stenosis.  Aortic valve noted to be unremarkable without regurgitation although is a little bit of sclerosis without evidence of stenosis.  Chest x-ray ordered today by myself remarkable for a little bit of atelectasis but no focal consolidation, effusion, edema, pneumothorax or other clear acute thoracic process.  I also reviewed radiology interpretation and agree with their findings.  Overall I suspect likely ongoing pain from patient's pulmonary embolism diagnosed yesterday.  He has not taken his prescribed pain medication or his Eliquis.  He was given a dose of both of these.  He is otherwise hemodynamically stable and I have a low suspicion for other immediate life-threatening process.  Unfortunately seems patient is homeless and has no money.  This is a major obstacle to him getting appropriate anticoagulation and tomorrow is [place] [employer] so pharmacy will likely not be open at that time.  I was able to obtain 3 additional doses of Eliquis for patient to take later today as well as to tomorrow until he can get into pharmacy the following day.  He will be given anticoagulation and I discussed this with patient at bedside and gave him the tablets  myself discussing in detail how and when to take them.  He is amenable this plan.  Discussed importance of getting his prescription filled so that he can continue anticoagulation as directed and return again for any new or worsening symptoms.  His pain is much better controlled on my assessment.  He was a slight tachycardic on arrival this is resolved after some IV fluids and pain medication.  Low suspicion for other immediate life-threatening process.  Discharged in stable condition.      FINAL CLINICAL IMPRESSION(S) / ED DIAGNOSES   Final diagnoses:  Acute pulmonary embolism without acute cor pulmonale, unspecified pulmonary embolism type ([place])     Rx / DC Orders   ED Discharge Orders     None        Note:  This document was prepared using Dragon voice recognition software and may include unintentional dictation errors.   Lucrezia Starch, MD 10/08/21 [phone]

## 2021-10-07 NOTE — Discharge Summary (Signed)
Physician Discharge Summary  Isiac Machin [id number] DOB: [birth year] DOA: 10/06/2021  PCP: Pcp, No  Admit date: 10/06/2021 Discharge date: 10/07/2021  Admitted From: Home Disposition: Home  Recommendations for Outpatient Follow-up:  Establish care with PCP   Home Health: No Equipment/Devices: None  Discharge Condition: Stable CODE STATUS: Full Diet recommendation: Regular  Brief/Interim Summary: 61 year old male with history of hypertension, polysubstance abuse, hepatitis C who presents to the ED with left-sided pleuritic type chest pain associated with worsening shortness of breath over the last few weeks.  Suppose it out of hospital syncopal event.  He had some prodrome of lightheadedness.  No urinary or stool incontinence.  No witnessed shaking activity.  No amnesia for the event.  No head trauma.   Presentation CT angio is positive for bilateral pulmonary embolism.  No right heart strain noted on CAT scan.  Bilateral lower extremity duplex negative for DVT.  Otherwise hemodynamically stable.  On room air.  Transthoracic echo with normal ejection fraction, no evidence of diastolic dysfunction or right heart strain.  No valvulopathy noted.  Patient transition from IV heparin to p.o. Eliquis.  Pharmacy consulted for benefits check.  Monthly cost will be $4.  Prescription sent for as needed pain regimen and Eliquis x3 months to outpatient pharmacy.  TOC engaged to provide information for establishment of primary care as patient does not have a primary care physician and will need follow-up at or before the 16-month mark to discuss ongoing need for anticoagulation.  Patient is extensively counseled on the importance of medication adherence and the potential consequences of nonadherence to Eliquis therapy.  He is also advised to cease all drug use.   Discharge Diagnoses:  Principal Problem:   Acute pulmonary embolism ([place])  Acute pulmonary embolism Pleuritic chest  pain Patient remained hemodynamically stable and on room air during course of admission.  Bilateral lower extremity Doppler negative for VTE.  Transthoracic echocardiogram negative for RV strain, normal ejection fraction.  Patient stable on day of discharge.  Still complaining of pleuritic chest pain.  Will transition to p.o. Eliquis.  31-month supply sent to outpatient pharmacy.  TOC engaged for PCP resources.  Patient counseled importance of medication adherence.  History of polysubstance abuse Counseled for cessation  Discharge Instructions  Discharge Instructions     Diet - low sodium heart healthy   Complete by: As directed    Increase activity slowly   Complete by: As directed       Allergies as of 10/07/2021   No Known Allergies      Medication List     STOP taking these medications    bacitracin 500 UNIT/GM ointment   clonazePAM 1 MG disintegrating tablet Commonly known as: KLONOPIN   cyclobenzaprine 10 MG tablet Commonly known as: FLEXERIL   dicyclomine 20 MG tablet Commonly known as: BENTYL   feeding supplement (OSMOLITE 1.5 CAL) Liqd   feeding supplement (PRO-STAT SUGAR FREE 64) Liqd   folic acid 1 MG tablet Commonly known as: FOLVITE   hydrochlorothiazide 12.5 MG capsule Commonly known as: MICROZIDE   lidocaine 5 % Commonly known as: Lidoderm   metoprolol tartrate 25 mg/10 mL Susp Commonly known as: LOPRESSOR   multivitamin with minerals Tabs tablet   naproxen 375 MG tablet Commonly known as: NAPROSYN   ondansetron 4 MG disintegrating tablet Commonly known as: ZOFRAN-ODT   oxyCODONE-acetaminophen 5-325 MG tablet Commonly known as: Percocet   pantoprazole 20 MG tablet Commonly known as: PROTONIX   predniSONE 10 MG tablet Commonly  known as: DELTASONE   QUEtiapine 100 MG tablet Commonly known as: SEROQUEL   thiamine 100 MG tablet   traMADol 50 MG tablet Commonly known as: Ultram       TAKE these medications    amLODipine 5 MG  tablet Commonly known as: NORVASC Take 1 tablet (5 mg total) by mouth daily.   apixaban 5 MG Tabs tablet Commonly known as: ELIQUIS Take 2 tablets (10 mg total) by mouth 2 (two) times daily for 7 days, THEN 1 tablet (5 mg total) 2 (two) times daily. Start taking on: October 07, 2021   ibuprofen 600 MG tablet Commonly known as: ADVIL Take 1 tablet (600 mg total) by mouth every 8 (eight) hours as needed for moderate pain.   methocarbamol 500 MG tablet Commonly known as: Robaxin Take 1 tablet (500 mg total) by mouth 4 (four) times daily for 14 days.   oxyCODONE 5 MG immediate release tablet Commonly known as: Oxy IR/ROXICODONE Take 1 tablet (5 mg total) by mouth every 6 (six) hours as needed for up to 3 days for moderate pain or severe pain.        No Known Allergies  Consultations: None   Procedures/Studies: DG Chest 2 View  Result Date: 10/02/2021 CLINICAL DATA:  Chest pain. EXAM: CHEST - 2 VIEW COMPARISON:  Chest radiograph dated 09/30/2021. FINDINGS: No focal consolidation, pleural effusion, pneumothorax. The cardiac silhouette is within normal limits. No acute osseous pathology. Degenerative changes of the spine. IMPRESSION: No active cardiopulmonary disease. Electronically Signed   By: Elgie Collard M.D.   On: 10/02/2021 21:57   DG Chest 2 View  Result Date: 09/30/2021 CLINICAL DATA:  Chest pain EXAM: CHEST - 2 VIEW COMPARISON:  None. FINDINGS: The heart size and mediastinal contours are within normal limits. Both lungs are clear. The visualized skeletal structures are unremarkable. IMPRESSION: No active cardiopulmonary disease. Electronically Signed   By: Deatra Robinson M.D.   On: 09/30/2021 01:02   DG Chest 2 View  Result Date: 09/19/2021 CLINICAL DATA:  Chest pain. EXAM: CHEST - 2 VIEW COMPARISON:  PA Lat 09/09/2021 FINDINGS: Heart size and vasculature are normal. There is a slightly tortuous aorta with patchy calcification in the arch. The lungs are slightly  emphysematous but clear. The sulci are sharp. There is thoracic spondylosis and a few lower thoracic levels with anterior bridging enthesopathy. There is mild osteopenia, and chronic healed fractures of the posterolateral left ribs. IMPRESSION: No active cardiopulmonary disease.  Stable COPD chest. Electronically Signed   By: Almira Bar M.D.   On: 09/19/2021 03:07   DG Chest 2 View  Result Date: 09/09/2021 CLINICAL DATA:  Chest pain EXAM: CHEST - 2 VIEW COMPARISON:  08/16/2021 multiple healed left FINDINGS: Lungs are well expanded, symmetric, and clear. No pneumothorax or pleural effusion. Cardiac size within normal limits. Pulmonary vascularity is normal. Osseous structures are age-appropriate. Rib fractures are again noted. No acute bone abnormality. IMPRESSION: No active cardiopulmonary disease. Electronically Signed   By: Helyn Numbers M.D.   On: 09/09/2021 23:00   DG Lumbar Spine Complete  Result Date: 09/30/2021 CLINICAL DATA:  Fall EXAM: LUMBAR SPINE - COMPLETE 4+ VIEW COMPARISON:  05/14/2021 FINDINGS: Moderate to advanced diffuse degenerative disc disease and facet disease. Normal alignment. No fracture. SI joints symmetric and unremarkable. Aortic calcifications. No aneurysm. IMPRESSION: Moderate to advanced diffuse degenerative disc and facet disease. No acute bony abnormality. Electronically Signed   By: Charlett Nose M.D.   On: 09/30/2021 21:54  CT Head Wo Contrast  Result Date: 10/06/2021 CLINICAL DATA:  Head trauma, moderate-severe.  Syncope.  Fall. EXAM: CT HEAD WITHOUT CONTRAST TECHNIQUE: Contiguous axial images were obtained from the base of the skull through the vertex without intravenous contrast. RADIATION DOSE REDUCTION: This exam was performed according to the departmental dose-optimization program which includes automated exposure control, adjustment of the mA and/or kV according to patient size and/or use of iterative reconstruction technique. COMPARISON:  09/30/2021  FINDINGS: Brain: There is atrophy and chronic small vessel disease changes. No acute intracranial abnormality. Specifically, no hemorrhage, hydrocephalus, mass lesion, acute infarction, or significant intracranial injury. Vascular: No hyperdense vessel or unexpected calcification. Skull: No acute calvarial abnormality. Sinuses/Orbits: No acute findings Other: None IMPRESSION: Atrophy, chronic microvascular disease. No acute intracranial abnormality. Electronically Signed   By: Rolm Baptise M.D.   On: 10/06/2021 03:13   CT HEAD WO CONTRAST (5MM)  Result Date: 09/30/2021 CLINICAL DATA:  Head trauma, abnormal mental status (Age 58-64y) EXAM: CT HEAD WITHOUT CONTRAST TECHNIQUE: Contiguous axial images were obtained from the base of the skull through the vertex without intravenous contrast. COMPARISON:  Head CT 05/14/2021 FINDINGS: Brain: Stable but age advanced atrophy and chronic small vessel ischemic change. No intracranial hemorrhage, mass effect, or midline shift. No hydrocephalus. The basilar cisterns are patent. No evidence of territorial infarct or acute ischemia. No extra-axial or intracranial fluid collection. Vascular: Atherosclerosis of skullbase vasculature without hyperdense vessel or abnormal calcification. Skull: No fracture or focal lesion. Sinuses/Orbits: Paranasal sinuses and mastoid air cells are clear. The visualized orbits are unremarkable. Chronic nasal bone fractures in unchanged alignment. Other: None. IMPRESSION: 1. No acute intracranial abnormality. No skull fracture. 2. Stable but age advanced atrophy and chronic small vessel ischemic change. Electronically Signed   By: Keith Rake M.D.   On: 09/30/2021 22:23   CT Angio Chest PE W and/or Wo Contrast  Result Date: 10/06/2021 CLINICAL DATA:  Positive D-dimer.  Pulmonary embolism suspected EXAM: CT ANGIOGRAPHY CHEST WITH CONTRAST TECHNIQUE: Multidetector CT imaging of the chest was performed using the standard protocol during bolus  administration of intravenous contrast. Multiplanar CT image reconstructions and MIPs were obtained to evaluate the vascular anatomy. RADIATION DOSE REDUCTION: This exam was performed according to the departmental dose-optimization program which includes automated exposure control, adjustment of the mA and/or kV according to patient size and/or use of iterative reconstruction technique. CONTRAST:  77mL OMNIPAQUE IOHEXOL 350 MG/ML SOLN COMPARISON:  Chest CT 06/25/2021 FINDINGS: Cardiovascular: Positive for branching pulmonary artery filling defects seen in the lateral basal segment of the left lower lobe and in subsegmental branches of the right upper and lower lobes. RV to LV ratio is within normal limits at 0.9. No acute systemic arterial finding when allowing for contrast timing. Atherosclerosis. Normal heart size. No pericardial effusion. Mediastinum/Nodes: Negative for adenopathy. Lungs/Pleura: No pulmonary infarct or failure. Mild atelectasis at the lung bases. Upper Abdomen: No acute finding Musculoskeletal: Spondylosis. Review of the MIP images confirms the above findings. Critical Value/emergent results were called by telephone at the time of interpretation on 10/06/2021 at 5:20 am to provider [facility] , who verbally acknowledged these results. IMPRESSION: Multiple acute segmental to subsegmental pulmonary emboli. Negative for right heart strain or pulmonary infarct. Electronically Signed   By: Jorje Guild M.D.   On: 10/06/2021 05:21   CT Lumbar Spine Wo Contrast  Result Date: 10/06/2021 CLINICAL DATA:  Initial evaluation for acute trauma, fall. EXAM: CT LUMBAR SPINE WITHOUT CONTRAST TECHNIQUE: Multidetector CT imaging of  the lumbar spine was performed without intravenous contrast administration. Multiplanar CT image reconstructions were also generated. RADIATION DOSE REDUCTION: This exam was performed according to the departmental dose-optimization program which includes automated exposure  control, adjustment of the mA and/or kV according to patient size and/or use of iterative reconstruction technique. COMPARISON:  Prior radiograph from 09/30/2021 and MRI from 09/25/2021. FINDINGS: Segmentation: Standard. Lowest well-formed disc space labeled the L5-S1 level. Alignment: Sigmoid scoliotic curvature of the lumbar spine. Trace retrolisthesis of L2 on L3. Appearance is stable from prior. Vertebrae: Vertebral body height maintained without acute or chronic fracture. Visualized sacrum and pelvis intact. SI joints symmetric and within normal limits. Remotely healed chronic fracture of the left posterior twelfth rib noted. Visualized ribs otherwise intact. Subcentimeter sclerotic lesion noted within the right iliac wing, likely benign. No other discrete or worrisome osseous lesions. Paraspinal and other soft tissues: Paraspinous soft tissues demonstrate no acute finding. Moderate aorto bi-iliac atherosclerotic disease. Disc levels: Moderate multilevel degenerative spondylosis and facet arthrosis seen throughout the lumbar spine, not significantly changed as compared to recent MRI from 09/25/2021 where these changes are better seen and characterized. Please refer to this recent report for a full description of these findings. IMPRESSION: 1. No acute traumatic injury within the lumbar spine. 2. Sigmoid scoliosis with moderate multilevel degenerative spondylosis and facet arthrosis, not significantly changed as compared to recent MRI from 09/25/2021 where these changes are better seen. These changes are fully described on this recent exam, please refer to prior report for a full description of these findings. Aortic Atherosclerosis (ICD10-I70.0). Electronically Signed   By: Jeannine Boga M.D.   On: 10/06/2021 03:26   MR LUMBAR SPINE WO CONTRAST  Result Date: 09/25/2021 CLINICAL DATA:  Low back pain, left rib pain and left leg pain for 3-4 weeks. EXAM: MRI LUMBAR SPINE WITHOUT CONTRAST TECHNIQUE:  Multiplanar, multisequence MR imaging of the lumbar spine was performed. No intravenous contrast was administered. COMPARISON:  Lumbar spine radiographs 05/14/2021 FINDINGS: Segmentation: There are five lumbar type vertebral bodies. The last full intervertebral disc space is labeled L5-S1. This correlates with the lumbar radiographs. Alignment: Mild scoliosis but normal overall alignment in the sagittal plane. Vertebrae: Age advance degenerative lumbar spondylosis with multilevel disc disease and facet disease and associated endplate reactive changes but no fracture or bone lesion. The facets are normally aligned. No pars defects. Conus medullaris and cauda equina: Conus extends to the bottom of T12 level. Conus and cauda equina appear normal. Paraspinal and other soft tissues: No significant paraspinal or retroperitoneal findings. Disc levels: T12-L1: Mild annular bulge with mild bilateral lateral recess encroachment. No spinal or foraminal stenosis. L1-2: Degenerative disc disease and facet disease with a diffuse bulging annulus, osteophytic ridging and a small focal left paracentral disc protrusion. These findings contribute to mild spinal and mild bilateral lateral recess stenosis and possible irritation of the left L2 nerve root. No significant foraminal stenosis. L2-3: Advanced degenerative disc disease and facet disease. Broad-based bulging annulus and osteophytic ridging along with paracentral disc protrusions most significant on the left side. There is moderate to moderately severe spinal and bilateral lateral recess stenosis and right greater than left foraminal stenosis. L3-4: Bulging annulus, osteophytic ridging and facet disease with mild flattening of the ventral thecal sac and mild bilateral lateral recess stenosis. No significant spinal stenosis or foraminal stenosis. There is and extraforaminal and far lateral disc osteophyte complex on the right potentially irritating the extraforaminal right L3  nerve root. L4-5: Bulging degenerated annulus, small  central disc protrusion, osteophytic ridging and severe right-sided facet disease. There is mass effect on the right side of the thecal sac and moderate right lateral recess stenosis. There is also significant right foraminal stenosis likely involving the right L4 nerve root. No significant spinal stenosis. L5-S1: Bulging annulus and moderate facet disease but no focal disc protrusions, significant spinal or foraminal stenosis. IMPRESSION: 1. Scoliosis and degenerative lumbar spondylosis with multilevel disc disease and facet disease. 2. Multilevel multifactorial spinal, lateral recess and foraminal stenosis as discussed above at the individual levels. The most significant level is L2-3 where there is moderate to moderately severe spinal and bilateral lateral recess stenosis and right greater than left foraminal stenosis. 3. Extraforaminal and far lateral disc osteophyte complex on the right at L3-4 potentially irritating the extraforaminal right L3 nerve root. 4. Moderate right lateral recess stenosis and significant right foraminal stenosis at L4-5. Electronically Signed   By: Marijo Sanes M.D.   On: 09/25/2021 09:52   CT ABDOMEN PELVIS W CONTRAST  Result Date: 09/25/2021 CLINICAL DATA:  Acute back pain. EXAM: CT ABDOMEN AND PELVIS WITH CONTRAST TECHNIQUE: Multidetector CT imaging of the abdomen and pelvis was performed using the standard protocol following bolus administration of intravenous contrast. CONTRAST:  173mL OMNIPAQUE IOHEXOL 300 MG/ML  SOLN COMPARISON:  August 16, 2021. FINDINGS: Lower chest: No acute abnormality. Hepatobiliary: No focal liver abnormality is seen. No gallstones, gallbladder wall thickening, or biliary dilatation. Pancreas: Unremarkable. No pancreatic ductal dilatation or surrounding inflammatory changes. Spleen: Normal in size without focal abnormality. Adrenals/Urinary Tract: Adrenal glands are unremarkable. Kidneys are  normal, without renal calculi, focal lesion, or hydronephrosis. Bladder is unremarkable. Stomach/Bowel: The stomach appears normal. There is no evidence of bowel obstruction or inflammation. Vascular/Lymphatic: Aortic atherosclerosis. No enlarged abdominal or pelvic lymph nodes. Reproductive: Prostate is unremarkable. Other: No abdominal wall hernia or abnormality. No abdominopelvic ascites. Musculoskeletal: No acute or significant osseous findings. IMPRESSION: No acute abnormality seen in the abdomen or pelvis. Aortic Atherosclerosis (ICD10-I70.0). Electronically Signed   By: Marijo Conception M.D.   On: 09/25/2021 09:06   US Venous Img Lower Bilateral (DVT)  Result Date: 10/06/2021 CLINICAL DATA:  Bilateral lower extremity pain. History of smoking. Patient admitted with pulmonary embolism. Evaluate for DVT. EXAM: BILATERAL LOWER EXTREMITY VENOUS DOPPLER ULTRASOUND TECHNIQUE: Gray-scale sonography with graded compression, as well as color Doppler and duplex ultrasound were performed to evaluate the lower extremity deep venous systems from the level of the common femoral vein and including the common femoral, femoral, profunda femoral, popliteal and calf veins including the posterior tibial, peroneal and gastrocnemius veins when visible. The superficial great saphenous vein was also interrogated. Spectral Doppler was utilized to evaluate flow at rest and with distal augmentation maneuvers in the common femoral, femoral and popliteal veins. COMPARISON:  Chest CTA earlier same day FINDINGS: RIGHT LOWER EXTREMITY Common Femoral Vein: No evidence of thrombus. Normal compressibility, respiratory phasicity and response to augmentation. Saphenofemoral Junction: No evidence of thrombus. Normal compressibility and flow on color Doppler imaging. Profunda Femoral Vein: No evidence of thrombus. Normal compressibility and flow on color Doppler imaging. Femoral Vein: No evidence of thrombus. Normal compressibility, respiratory  phasicity and response to augmentation. Popliteal Vein: No evidence of thrombus. Normal compressibility, respiratory phasicity and response to augmentation. Calf Veins: No evidence of thrombus. Normal compressibility and flow on color Doppler imaging. Superficial Great Saphenous Vein: No evidence of thrombus. Normal compressibility. Venous Reflux:  None. Other Findings:  None. LEFT LOWER EXTREMITY Common Femoral Vein:  No evidence of thrombus. Normal compressibility, respiratory phasicity and response to augmentation. Saphenofemoral Junction: No evidence of thrombus. Normal compressibility and flow on color Doppler imaging. Profunda Femoral Vein: No evidence of thrombus. Normal compressibility and flow on color Doppler imaging. Femoral Vein: No evidence of thrombus. Normal compressibility, respiratory phasicity and response to augmentation. Popliteal Vein: No evidence of thrombus. Normal compressibility, respiratory phasicity and response to augmentation. Calf Veins: No evidence of thrombus. Normal compressibility and flow on color Doppler imaging. Superficial Great Saphenous Vein: No evidence of thrombus. Normal compressibility. Venous Reflux:  None. Other Findings:  None. IMPRESSION: No evidence of DVT within either lower extremity. Electronically Signed   By: Sandi Mariscal M.D.   On: 10/06/2021 11:25   ECHOCARDIOGRAM COMPLETE  Result Date: 10/06/2021    ECHOCARDIOGRAM REPORT   Patient Name:   TYELOR PARRALES Date of Exam: 10/06/2021 Medical Rec #:  [id number]            Height:       68.0 in Accession #:    [id number]           Weight:       145.0 lb Date of Birth:  15-Feb-1961           BSA:          1.783 m Patient Age:    9 years             BP:           143/86 mmHg Patient Gender: M                    HR:           69 bpm. Exam Location:  [facility] Procedure: 2D Echo, Color Doppler and Cardiac Doppler Indications:     I26.09 Pulmonary Embolus  History:         Patient has no prior history of Echocardiogram  examinations.                  Risk Factors:Hypertension. Hx of polysubstance abuse; Hx of                  Hepatitis C.  Sonographer:     Charmayne Sheer Referring Phys:  [id number] JAN A MANSY Diagnosing Phys: Donnelly Angelica IMPRESSIONS  1. Left ventricular ejection fraction, by estimation, is 60 to 65%. The left ventricle has normal function. The left ventricle has no regional wall motion abnormalities. Left ventricular diastolic parameters were normal.  2. Right ventricular systolic function is normal. The right ventricular size is mildly enlarged.  3. The mitral valve is normal in structure. No evidence of mitral valve regurgitation. No evidence of mitral stenosis.  4. The aortic valve is normal in structure. Aortic valve regurgitation is not visualized. Aortic valve sclerosis is present, with no evidence of aortic valve stenosis.  5. Aortic dilatation noted. There is mild dilatation of the ascending aorta, measuring 39 mm.  6. The inferior vena cava is normal in size with greater than 50% respiratory variability, suggesting right atrial pressure of 3 mmHg. FINDINGS  Left Ventricle: Left ventricular ejection fraction, by estimation, is 60 to 65%. The left ventricle has normal function. The left ventricle has no regional wall motion abnormalities. The left ventricular internal cavity size was normal in size. There is  no left ventricular hypertrophy. Left ventricular diastolic parameters were normal. Right Ventricle: The right ventricular size is mildly enlarged. No increase in right ventricular wall thickness. Right  ventricular systolic function is normal. Left Atrium: Left atrial size was normal in size. Right Atrium: Right atrial size was normal in size. Pericardium: There is no evidence of pericardial effusion. Mitral Valve: The mitral valve is normal in structure. No evidence of mitral valve regurgitation. No evidence of mitral valve stenosis. MV peak gradient, 2.5 mmHg. The mean mitral valve gradient is 1.0 mmHg.  Tricuspid Valve: The tricuspid valve is normal in structure. Tricuspid valve regurgitation is not demonstrated. No evidence of tricuspid stenosis. Aortic Valve: The aortic valve is normal in structure. Aortic valve regurgitation is not visualized. Aortic valve sclerosis is present, with no evidence of aortic valve stenosis. Aortic valve mean gradient measures 3.0 mmHg. Aortic valve peak gradient measures 6.7 mmHg. Aortic valve area, by VTI measures 2.73 cm. Pulmonic Valve: The pulmonic valve was not well visualized. Pulmonic valve regurgitation is not visualized. No evidence of pulmonic stenosis. Aorta: The aortic root is normal in size and structure and aortic dilatation noted. There is mild dilatation of the ascending aorta, measuring 39 mm. Venous: The inferior vena cava is normal in size with greater than 50% respiratory variability, suggesting right atrial pressure of 3 mmHg. IAS/Shunts: No atrial level shunt detected by color flow Doppler.  LEFT VENTRICLE PLAX 2D LVIDd:         3.82 cm   Diastology LVIDs:         2.50 cm   LV e' medial:    6.31 cm/s LV PW:         1.00 cm   LV E/e' medial:  9.1 LV IVS:        0.84 cm   LV e' lateral:   7.18 cm/s LVOT diam:     2.40 cm   LV E/e' lateral: 8.0 LV SV:         68 LV SV Index:   38 LVOT Area:     4.52 cm  RIGHT VENTRICLE RV Basal diam:  2.34 cm RV S prime:     9.90 cm/s LEFT ATRIUM             Index        RIGHT ATRIUM           Index LA diam:        2.90 cm 1.63 cm/m   RA Area:     11.80 cm LA Vol (A2C):   34.2 ml 19.18 ml/m  RA Volume:   24.80 ml  13.91 ml/m LA Vol (A4C):   13.8 ml 7.74 ml/m LA Biplane Vol: 22.5 ml 12.62 ml/m  AORTIC VALVE                    PULMONIC VALVE AV Area (Vmax):    2.68 cm     PV Vmax:       0.67 m/s AV Area (Vmean):   2.91 cm     PV Vmean:      49.100 cm/s AV Area (VTI):     2.73 cm     PV VTI:        0.128 m AV Vmax:           129.00 cm/s  PV Peak grad:  1.8 mmHg AV Vmean:          85.200 cm/s  PV Mean grad:  1.0 mmHg AV  VTI:            0.249 m AV Peak Grad:      6.7 mmHg AV Mean  Grad:      3.0 mmHg LVOT Vmax:         76.40 cm/s LVOT Vmean:        54.800 cm/s LVOT VTI:          0.150 m LVOT/AV VTI ratio: 0.60  AORTA Ao Root diam: 3.60 cm MITRAL VALVE MV Area (PHT): 2.84 cm    SHUNTS MV Area VTI:   2.91 cm    Systemic VTI:  0.15 m MV Peak grad:  2.5 mmHg    Systemic Diam: 2.40 cm MV Mean grad:  1.0 mmHg MV Vmax:       0.79 m/s MV Vmean:      42.1 cm/s MV Decel Time: 267 msec MV E velocity: 57.40 cm/s MV A velocity: 75.80 cm/s MV E/A ratio:  0.76 Donnelly Angelica Electronically signed by Donnelly Angelica Signature Date/Time: 10/06/2021/3:57:37 PM    Final       Subjective: Seen and examined the day of discharge.  Stable no distress.  Still endorsing pleuritic chest pain.  All vital signs stable.  Stable for discharge home.  Discharge Exam: Vitals:   10/07/21 1125 10/07/21 1300  BP: (!) 153/87 131/76  Pulse: 74 75  Resp: 15 14  Temp:    SpO2: 97% 96%   Vitals:   10/07/21 1030 10/07/21 1120 10/07/21 1125 10/07/21 1300  BP:   (!) 153/87 131/76  Pulse: 67 78 74 75  Resp: 16 (!) 22 15 14   Temp:      TempSrc:      SpO2: 98% 97% 97% 96%  Weight:      Height:        General: Pt is alert, awake, not in acute distress Cardiovascular: RRR, S1/S2 +, no rubs, no gallops Respiratory: CTA bilaterally, no wheezing, no rhonchi Abdominal: Soft, NT, ND, bowel sounds + Extremities: no edema, no cyanosis    The results of significant diagnostics from this hospitalization (including imaging, microbiology, ancillary and laboratory) are listed below for reference.     Microbiology: No results found for this or any previous visit (from the past 240 hour(s)).   Labs: BNP (last 3 results) No results for input(s): BNP in the last 8760 hours. Basic Metabolic Panel: Recent Labs  Lab 09/30/21 2112 10/02/21 2139 10/05/21 2153 10/07/21 0623  NA 134* 133* 133* 135  K 4.6 3.7 3.4* 3.7  CL 99 98 101 99  CO2 25 25 22 26    GLUCOSE 95 86 111* 94  BUN 9 7 8 8   CREATININE 0.64 0.65 0.72 0.75  CALCIUM 9.6 9.6 9.2 9.3   Liver Function Tests: Recent Labs  Lab 09/30/21 2112  AST 58*  ALT 51*  ALKPHOS 70  BILITOT 0.6  PROT 7.4  ALBUMIN 3.8   No results for input(s): LIPASE, AMYLASE in the last 168 hours. No results for input(s): AMMONIA in the last 168 hours. CBC: Recent Labs  Lab 09/30/21 2112 10/02/21 2139 10/05/21 2153 10/07/21 0623  WBC 7.9 8.1 8.3 5.5  NEUTROABS 5.0  --   --   --   HGB 13.8 14.0 13.6 13.1  HCT 40.0 39.9 38.7* 38.6*  MCV 99.3 100.0 99.2 97.7  PLT 130* 141* 127* 124*   Cardiac Enzymes: No results for input(s): CKTOTAL, CKMB, CKMBINDEX, TROPONINI in the last 168 hours. BNP: Invalid input(s): POCBNP CBG: No results for input(s): GLUCAP in the last 168 hours. D-Dimer Recent Labs    10/06/21 0321  DDIMER 0.85*   Hgb A1c No results for  input(s): HGBA1C in the last 72 hours. Lipid Profile No results for input(s): CHOL, HDL, LDLCALC, TRIG, CHOLHDL, LDLDIRECT in the last 72 hours. Thyroid function studies No results for input(s): TSH, T4TOTAL, T3FREE, THYROIDAB in the last 72 hours.  Invalid input(s): FREET3 Anemia work up No results for input(s): VITAMINB12, FOLATE, FERRITIN, TIBC, IRON, RETICCTPCT in the last 72 hours. Urinalysis    Component Value Date/Time   COLORURINE COLORLESS (A) 09/30/2021 2109   APPEARANCEUR CLEAR (A) 09/30/2021 2109   APPEARANCEUR Clear 12/14/2012 1150   LABSPEC 1.001 (L) 09/30/2021 2109   LABSPEC 1.011 12/14/2012 1150   PHURINE 5.0 09/30/2021 2109   GLUCOSEU NEGATIVE 09/30/2021 2109   GLUCOSEU Negative 12/14/2012 1150   HGBUR NEGATIVE 09/30/2021 2109   BILIRUBINUR NEGATIVE 09/30/2021 2109   BILIRUBINUR Negative 12/14/2012 [place] NEGATIVE 09/30/2021 2109   PROTEINUR NEGATIVE 09/30/2021 2109   NITRITE NEGATIVE 09/30/2021 2109   LEUKOCYTESUR NEGATIVE 09/30/2021 2109   LEUKOCYTESUR Negative 12/14/2012 1150   Sepsis  Labs Invalid input(s): PROCALCITONIN,  WBC,  LACTICIDVEN Microbiology No results found for this or any previous visit (from the past 240 hour(s)).   Time coordinating discharge: Over 30 minutes  SIGNED:   Sidney Ace, MD  [facility] 10/07/2021, 2:37 PM Pager   If 7PM-7AM, please contact night-coverage

## 2021-10-07 NOTE — Progress Notes (Signed)
ANTICOAGULATION CONSULT NOTE   Pharmacy Consult for transition from heparin infusion to apixaban Indication: pulmonary embolus  Patient Measurements: Height: 5\' 8"  (172.7 cm) Weight: 65.8 kg (145 lb) IBW/kg (Calculated) : 68.4 Heparin Dosing Weight: 65.8 kg  Labs: Recent Labs    10/05/21 2153 10/06/21 0207 10/06/21 0605 10/06/21 1206 10/06/21 1753 10/07/21 0623  HGB 13.6  --   --   --   --  13.1  HCT 38.7*  --   --   --   --  38.6*  PLT 127*  --   --   --   --  124*  APTT  --   --  26  --   --   --   LABPROT  --   --  13.2  --   --   --   INR  --   --  1.0  --   --   --   HEPARINUNFRC  --   --   --  0.40 0.29* 0.52  CREATININE 0.72  --   --   --   --  0.75  TROPONINIHS 3 3  --   --   --   --      Estimated Creatinine Clearance: 92.5 mL/min (by C-G formula based on SCr of 0.75 mg/dL).   Medical History: Past Medical History:  Diagnosis Date   Hep C w/o coma, chronic (HCC)    Hypertension    Polysubstance abuse (HCC)     Assessment: Pt is a 61 yo male presenting to ED for evaluation of back pain, chest pain and syncope found with "multiple acute segmental to subsegmental pulmonary emboli."  Goal of Therapy:  Monitor platelets by anticoagulation protocol: Yes   Plan:  Stop heparin infusion Start  apixaban 10 mg twice daily for 7 days followed by 5 mg twice daily CBC per protocol   46, PharmD, BCPS 10/07/2021 7:51 AM

## 2021-10-07 NOTE — ED Triage Notes (Signed)
Pt states he is here far reoccurant cp and blood clots- pt was seen here yesterday for same and discharged earlier today on oral blood thinners

## 2021-10-07 NOTE — ED Notes (Signed)
Pt escorted to lobby. Verbalized understanding discharge instructions, prescriptions, and follow-up.  Pt verbalized needing to visit Social Services on Monday. In no acute distress.

## 2021-10-08 LAB — RESP PANEL BY RT-PCR (FLU A&B, COVID) ARPGX2
Influenza A by PCR: NEGATIVE
Influenza B by PCR: NEGATIVE
SARS Coronavirus 2 by RT PCR: NEGATIVE

## 2021-10-08 MED ORDER — LORAZEPAM 2 MG/ML IJ SOLN: 1.0000 mg | INTRAMUSCULAR | Status: DC | PRN

## 2021-10-08 MED ORDER — THIAMINE HCL 100 MG/ML IJ SOLN: 100.0000 mg | Freq: Every day | INTRAMUSCULAR | Status: DC

## 2021-10-08 MED ORDER — THIAMINE HCL 100 MG PO TABS: 100.0000 mg | ORAL_TABLET | Freq: Every day | ORAL | Status: DC

## 2021-10-08 MED ORDER — ADULT MULTIVITAMIN W/MINERALS CH: 1.0000 | ORAL_TABLET | Freq: Every day | ORAL | Status: DC

## 2021-10-08 MED ORDER — FOLIC ACID 1 MG PO TABS: 1.0000 mg | ORAL_TABLET | Freq: Every day | ORAL | Status: DC

## 2021-10-08 MED ORDER — LACTATED RINGERS IV BOLUS
1000.0000 mL | Freq: Once | INTRAVENOUS | Status: AC
  Administered 2021-10-08: 1000 mL via INTRAVENOUS

## 2021-10-08 MED ORDER — LORAZEPAM 1 MG PO TABS: 1.0000 mg | ORAL_TABLET | ORAL | Status: DC | PRN

## 2021-10-08 NOTE — ED Notes (Addendum)
ED Provider at bedside. Plan to get this pt discharge and follow -up  and referral for social services. Patient verbalizes has appointment for social work on Monday

## 2021-10-10 ENCOUNTER — Emergency Department
Admission: EM | Admit: 2021-10-10 | Discharge: 2021-10-11 | Disposition: A | Discharge status: Home / Self Care | Payer: Medicaid Other | Attending: Student in an Organized Health Care Education/Training Program | Admitting: Student in an Organized Health Care Education/Training Program

## 2021-10-10 ENCOUNTER — Emergency Department: Payer: Medicaid Other

## 2021-10-10 ENCOUNTER — Other Ambulatory Visit: Payer: Self-pay

## 2021-10-10 ENCOUNTER — Encounter: Payer: Self-pay | Admitting: Emergency Medicine

## 2021-10-10 DIAGNOSIS — Z760 Encounter for issue of repeat prescription: Secondary | ICD-10-CM

## 2021-10-10 DIAGNOSIS — R079 Chest pain, unspecified: Secondary | ICD-10-CM | POA: Diagnosis present

## 2021-10-10 LAB — BASIC METABOLIC PANEL
Anion gap: 13 (ref 5–15)
BUN: 10 mg/dL (ref 6–20)
CO2: 23 mmol/L (ref 22–32)
Calcium: 9.5 mg/dL (ref 8.9–10.3)
Chloride: 93 mmol/L — ABNORMAL LOW (ref 98–111)
Creatinine, Ser: 0.73 mg/dL (ref 0.61–1.24)
GFR, Estimated: >60 mL/min (ref >60)
Glucose, Bld: 106 mg/dL — ABNORMAL HIGH (ref 70–99)
Potassium: 3.9 mmol/L (ref 3.5–5.1)
Sodium: 129 mmol/L — ABNORMAL LOW (ref 135–145)

## 2021-10-10 LAB — CBC
HCT: 40.2 % (ref 39.0–52.0)
Hemoglobin: 13.9 g/dL (ref 13.0–17.0)
MCH: 33.9 pg (ref 26.0–34.0)
MCHC: 34.6 g/dL (ref 30.0–36.0)
MCV: 98 fL (ref 80.0–100.0)
Platelets: 157 10*3/uL (ref 150–400)
RBC: 4.1 MIL/uL — ABNORMAL LOW (ref 4.22–5.81)
RDW: 12.2 % (ref 11.5–15.5)
WBC: 9 10*3/uL (ref 4.0–10.5)
nRBC: 0 % (ref 0.0–0.2)

## 2021-10-10 LAB — PROTIME-INR
INR: 1.1 (ref 0.8–1.2)
Prothrombin Time: 13.9 seconds (ref 11.4–15.2)

## 2021-10-10 LAB — TROPONIN I (HIGH SENSITIVITY)
Troponin I (High Sensitivity): 7 ng/L (ref <18)
Troponin I (High Sensitivity): 4 ng/L (ref <18)

## 2021-10-10 MED ORDER — APIXABAN 5 MG PO TABS
10.0000 mg | ORAL_TABLET | Freq: Two times a day (BID) | ORAL | Status: DC
  Administered 2021-10-10: 10 mg via ORAL
  Filled 2021-10-10: qty 2

## 2021-10-10 MED ORDER — OXYCODONE HCL 5 MG PO TABS
5.0000 mg | ORAL_TABLET | Freq: Once | ORAL | Status: AC
  Administered 2021-10-10: 5 mg via ORAL
  Filled 2021-10-10: qty 1
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
## 2021-10-11 MED ORDER — APIXABAN 5 MG PO TABS
10.0000 mg | ORAL_TABLET | Freq: Two times a day (BID) | ORAL | Status: DC
Start: 2021-10-11 — End: 2021-10-11

## 2021-10-11 MED ORDER — IBUPROFEN 600 MG PO TABS: 600.0000 mg | ORAL_TABLET | Freq: Three times a day (TID) | ORAL | Status: DC | PRN

## 2021-10-11 MED ORDER — AMLODIPINE BESYLATE 5 MG PO TABS: 5.0000 mg | ORAL_TABLET | Freq: Every day | ORAL | 0 refills | Status: DC

## 2021-10-11 MED ORDER — AMLODIPINE BESYLATE 5 MG PO TABS: 5.0000 mg | ORAL_TABLET | Freq: Every day | ORAL | Status: DC

## 2021-10-11 MED ORDER — APIXABAN (ELIQUIS) VTE STARTER PACK (10MG AND 5MG): ORAL_TABLET | ORAL | 0 refills | Status: DC

## 2021-10-11 NOTE — ED Provider Notes (Signed)
Procedures  Clinical Course as of 10/11/21 0823  Tue Oct 10, 2021  2100 My review of chest x-ray shows no pneumothorax. [PR]  2343 Troponin is negative.  Given the patient's homeless status and extensive social factors  [PR]    Clinical Course User Index [PR] Willy Eddy, MD    ----------------------------------------- 8:23 AM on 10/11/2021 ----------------------------------------- Pt now requesting discharge, states he will f/u with social work outpatient. Has MDM, stable for DC.     [name], MD 10/11/21 [phone]

## 2021-10-11 NOTE — ED Notes (Signed)
Patient ambulated to restroom with no issues.

## 2021-10-11 NOTE — ED Notes (Signed)
Pt expresses desire to leave. Pt going to follow up with social services department outpatient. Pt given printed prescriptions. E-signature not working at this time. Pt verbalized understanding of D/C instructions, prescriptions and follow up care with no further questions at this time. Pt in NAD and ambulatory at time of D/C.

## 2021-10-12 ENCOUNTER — Other Ambulatory Visit: Payer: Self-pay

## 2021-10-12 ENCOUNTER — Encounter: Payer: Self-pay | Admitting: *Deleted

## 2021-10-12 DIAGNOSIS — Z91199 Patient's noncompliance with other medical treatment and regimen due to unspecified reason: Secondary | ICD-10-CM | POA: Diagnosis not present

## 2021-10-12 DIAGNOSIS — Z5900 Homelessness unspecified: Secondary | ICD-10-CM | POA: Insufficient documentation

## 2021-10-12 DIAGNOSIS — I2694 Multiple subsegmental pulmonary emboli without acute cor pulmonale: Secondary | ICD-10-CM | POA: Insufficient documentation

## 2021-10-12 DIAGNOSIS — R079 Chest pain, unspecified: Secondary | ICD-10-CM | POA: Diagnosis present

## 2021-10-12 DIAGNOSIS — F1010 Alcohol abuse, uncomplicated: Secondary | ICD-10-CM | POA: Diagnosis not present

## 2021-10-12 LAB — TROPONIN I (HIGH SENSITIVITY): Troponin I (High Sensitivity): 6 ng/L (ref <18)

## 2021-10-12 LAB — BASIC METABOLIC PANEL
Anion gap: 9 (ref 5–15)
BUN: 8 mg/dL (ref 6–20)
CO2: 26 mmol/L (ref 22–32)
Calcium: 9.6 mg/dL (ref 8.9–10.3)
Chloride: 98 mmol/L (ref 98–111)
Creatinine, Ser: 0.78 mg/dL (ref 0.61–1.24)
GFR, Estimated: >60 mL/min (ref >60)
Glucose, Bld: 92 mg/dL (ref 70–99)
Potassium: 4.1 mmol/L (ref 3.5–5.1)
Sodium: 133 mmol/L — ABNORMAL LOW (ref 135–145)

## 2021-10-12 LAB — CBC
HCT: 38.3 % — ABNORMAL LOW (ref 39.0–52.0)
Hemoglobin: 13.2 g/dL (ref 13.0–17.0)
MCH: 34 pg (ref 26.0–34.0)
MCHC: 34.5 g/dL (ref 30.0–36.0)
MCV: 98.7 fL (ref 80.0–100.0)
Platelets: 164 10*3/uL (ref 150–400)
RBC: 3.88 MIL/uL — ABNORMAL LOW (ref 4.22–5.81)
RDW: 12.2 % (ref 11.5–15.5)
WBC: 7 10*3/uL (ref 4.0–10.5)
nRBC: 0 % (ref 0.0–0.2)

## 2021-10-12 NOTE — ED Triage Notes (Signed)
Pt reports chest pain and back pain   pt has been seen several times in the ER this week for similar sx.  Pt is homeless.  Pt reports pain is not any better.  Pt alert.

## 2021-10-13 ENCOUNTER — Emergency Department
Admission: EM | Admit: 2021-10-13 | Discharge: 2021-10-13 | Disposition: A | Discharge status: Home / Self Care | Payer: Medicaid Other | Attending: Emergency Medicine | Admitting: Emergency Medicine

## 2021-10-13 DIAGNOSIS — I2694 Multiple subsegmental pulmonary emboli without acute cor pulmonale: Secondary | ICD-10-CM

## 2021-10-13 DIAGNOSIS — F1010 Alcohol abuse, uncomplicated: Secondary | ICD-10-CM

## 2021-10-13 DIAGNOSIS — Z9114 Patient's other noncompliance with medication regimen: Secondary | ICD-10-CM

## 2021-10-13 DIAGNOSIS — Z5900 Homelessness unspecified: Secondary | ICD-10-CM

## 2021-10-13 LAB — TROPONIN I (HIGH SENSITIVITY): Troponin I (High Sensitivity): 6 ng/L (ref <18)

## 2021-10-13 MED ORDER — APIXABAN 5 MG PO TABS
10.0000 mg | ORAL_TABLET | ORAL | Status: AC
  Administered 2021-10-13: 10 mg via ORAL
  Filled 2021-10-13: qty 2
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
## 2021-10-13 NOTE — TOC Progression Note (Signed)
Transition of Care [facility]) - Progression Note    Patient Details  Name: Jose [name] MRN: [id number] Date of Birth: 14-Aug-1961  [facility] CM/SW Contact  Allayne Butcher, RN Phone Number: 10/13/2021, 3:48 PM  Clinical Narrative:     [facility] acknowledged consult this morning but on arrival to the emergency department the patient had already left from the ER.         Expected Discharge Plan and Services                                                 Social Determinants of Health (SDOH) Interventions    Readmission Risk Interventions No flowsheet data found.

## 2021-10-13 NOTE — ED Notes (Signed)
Pt advised RN he is ready to check out. Spoke with EDP and declined waiting for case management .  States he has eliquis at home and was advised to take it as directed.  Pt left dept ambulatory and in nad.

## 2021-10-13 NOTE — ED Provider Notes (Signed)
°  Patient requesting discharge not willing to wait for case management.  Reports having his Eliquis at home and we discussed appropriate return precautions for the ED.   Delton Prairie, MD 10/13/21 1024

## 2021-10-14 ENCOUNTER — Emergency Department: Payer: Medicaid Other

## 2021-10-14 ENCOUNTER — Other Ambulatory Visit: Payer: Self-pay

## 2021-10-14 ENCOUNTER — Emergency Department
Admission: EM | Admit: 2021-10-14 | Discharge: 2021-10-14 | Disposition: A | Discharge status: Home / Self Care | Payer: Medicaid Other | Attending: Emergency Medicine | Admitting: Emergency Medicine

## 2021-10-14 DIAGNOSIS — R0789 Other chest pain: Secondary | ICD-10-CM | POA: Insufficient documentation

## 2021-10-14 DIAGNOSIS — R079 Chest pain, unspecified: Secondary | ICD-10-CM | POA: Diagnosis not present

## 2021-10-14 DIAGNOSIS — Z7901 Long term (current) use of anticoagulants: Secondary | ICD-10-CM | POA: Insufficient documentation

## 2021-10-14 LAB — CBC
HCT: 39 % (ref 39.0–52.0)
Hemoglobin: 13.3 g/dL (ref 13.0–17.0)
MCH: 33.3 pg (ref 26.0–34.0)
MCHC: 34.1 g/dL (ref 30.0–36.0)
MCV: 97.7 fL (ref 80.0–100.0)
Platelets: 186 10*3/uL (ref 150–400)
RBC: 3.99 MIL/uL — ABNORMAL LOW (ref 4.22–5.81)
RDW: 12.3 % (ref 11.5–15.5)
WBC: 7.7 10*3/uL (ref 4.0–10.5)
nRBC: 0 % (ref 0.0–0.2)

## 2021-10-14 LAB — COMPREHENSIVE METABOLIC PANEL
ALT: 35 U/L (ref 0–44)
AST: 47 U/L — ABNORMAL HIGH (ref 15–41)
Albumin: 3.7 g/dL (ref 3.5–5.0)
Alkaline Phosphatase: 59 U/L (ref 38–126)
Anion gap: 10 (ref 5–15)
BUN: 7 mg/dL (ref 6–20)
CO2: 23 mmol/L (ref 22–32)
Calcium: 9.5 mg/dL (ref 8.9–10.3)
Chloride: 105 mmol/L (ref 98–111)
Creatinine, Ser: 0.49 mg/dL — ABNORMAL LOW (ref 0.61–1.24)
GFR, Estimated: >60 mL/min (ref >60)
Glucose, Bld: 87 mg/dL (ref 70–99)
Potassium: 3.4 mmol/L — ABNORMAL LOW (ref 3.5–5.1)
Sodium: 138 mmol/L (ref 135–145)
Total Bilirubin: 0.3 mg/dL (ref 0.3–1.2)
Total Protein: 7.5 g/dL (ref 6.5–8.1)

## 2021-10-14 LAB — TROPONIN I (HIGH SENSITIVITY)
Troponin I (High Sensitivity): 7 ng/L (ref <18)
Troponin I (High Sensitivity): 9 ng/L (ref <18)

## 2021-10-14 MED ORDER — APIXABAN 5 MG PO TABS: 5.0000 mg | ORAL_TABLET | Freq: Two times a day (BID) | ORAL | 0 refills | Status: DC

## 2021-10-14 NOTE — ED Triage Notes (Signed)
Pt states chest pain for several months. Pt with history of PE. Pt states is not taking the eliquis as prescribed.

## 2021-10-14 NOTE — ED Provider Notes (Addendum)
[facility] Provider Note    None    (approximate)   History   Chest Pain   HPI  Jose [name] is a 61 y.o. male who presents to the ED for evaluation of Chest Pain   I reviewed recurrent ED visits for ethanol abuse and associated complications.  I am familiar with this patient.  He was recently diagnosed with acute PE and started on Eliquis as an outpatient in the past 1 week.  I saw him yesterday as well.  He has been waiting in our lobby for about 8 hours for the same presenting symptoms as he typically does, chest pain.  He reports constant chest pain for a matter of months without significant changes.  Denies syncopal episodes, injuries, falls or trauma.  Denies worsening shortness of breath, fever, abdominal pain or emesis.  When I saw the patient yesterday, he reported having plenty of Eliquis at home.  Today he is reporting that he does not have any Eliquis at home.  Last taken yesterday.  Due to remarkable admission holds in the ED due to staffing issues, patient has been waiting in the waiting room for about 8 hours.  Prior to rooming, he is requesting discharge, so I see him in triage and he reports that he is ready to go home.  He reports sleeping well overnight in our waiting room and has no complaints right now.  We discussed the importance of adherence to his Eliquis.  I provide him with another prescription.   Physical Exam   Triage Vital Signs: ED Triage Vitals  Enc Vitals Group     BP 10/14/21 0053 (!) 132/94     Pulse Rate 10/14/21 0053 (!) 109     Resp 10/14/21 0053 20     Temp 10/14/21 0053 98.4 F (36.9 C)     Temp Source 10/14/21 0053 Oral     SpO2 10/14/21 0053 95 %     Weight 10/14/21 0052 145 lb (65.8 kg)     Height 10/14/21 0052 5\' 8"  (1.727 m)     Head Circumference --      Peak Flow --      Pain Score 10/14/21 0052 10     Pain Loc --      Pain Edu? --      Excl. in GC? --     Most recent vital signs: Vitals:    10/14/21 0426 10/14/21 0811  BP: (!) 144/95 (!) 155/91  Pulse: 92 82  Resp: 17 16  Temp:    SpO2: 96% 100%    General: Awake, no distress.  Ambulatory with normal gait. CV:  Good peripheral perfusion.  Resp:  Normal effort.  Abd:  No distention.  MSK:  No deformity noted.  Neuro:  No focal deficits appreciated. Other:     ED Results / Procedures / Treatments   Labs (all labs ordered are listed, but only abnormal results are displayed) Labs Reviewed  CBC - Abnormal; Notable for the following components:      Result Value   RBC 3.99 (*)    All other components within normal limits  COMPREHENSIVE METABOLIC PANEL - Abnormal; Notable for the following components:   Potassium 3.4 (*)    Creatinine, Ser 0.49 (*)    AST 47 (*)    All other components within normal limits  TROPONIN I (HIGH SENSITIVITY)  TROPONIN I (HIGH SENSITIVITY)    EKG  Sinus tachycardia, rate of 114 bpm.  Normal axis.  Right bundle.  No ischemic features.  RADIOLOGY CXR reviewed by me without evidence of acute cardiopulmonary pathology.  Official radiology report(s): DG Chest 2 View  Result Date: 10/14/2021 CLINICAL DATA:  History of pulmonary embolism, presenting with chest pain for several months. EXAM: CHEST - 2 VIEW COMPARISON:  October 10, 2021 FINDINGS: The heart size and mediastinal contours are within normal limits. Both lungs are clear. Chronic eighth and ninth left rib fractures are seen. IMPRESSION: No active cardiopulmonary disease. Electronically Signed   By: Aram Candela M.D.   On: 10/14/2021 01:24    PROCEDURES and INTERVENTIONS:  Procedures  Medications - No data to display   IMPRESSION / MDM / ASSESSMENT AND PLAN / ED COURSE  I reviewed the triage vital signs and the nursing notes.  62 year old male who was seen frequently for chest pain in the setting of alcohol abuse and recently diagnosed PE on Eliquis, presents to the ED with recurrent chronic chest pain without acute  features.  He is tachycardic in triage and this self resolves.  He looks clinically well without hypoxia or instability.  Work-up without novel features.  Troponins are low, metabolic panel with marginal hypokalemia and CBC is normal.  CXR unremarkable.  He is demanding discharge I see no reasons to hold him here, and we again discussed outpatient management of his condition.  We thoroughly discussed the importance of adherence to Eliquis and what to look out for at home to necessitate appropriate return to the ED. I considered CTA or medical admission for this patient due to his chest pain and tachycardia in triage, but with resolution of his pain and tachycardia, and his demands for outpatient management, I think it is reasonable not to admit.      FINAL CLINICAL IMPRESSION(S) / ED DIAGNOSES   Final diagnoses:  Other chest pain     Rx / DC Orders   ED Discharge Orders     None        Note:  This document was prepared using Dragon voice recognition software and may include unintentional dictation errors.   Delton Prairie, MD 10/14/21 9528    Delton Prairie, MD 10/14/21 [phone]

## 2021-10-15 ENCOUNTER — Emergency Department
Admission: EM | Admit: 2021-10-15 | Discharge: 2021-10-16 | Disposition: A | Discharge status: Home / Self Care | Payer: Medicaid Other | Attending: Emergency Medicine | Admitting: Emergency Medicine

## 2021-10-15 ENCOUNTER — Other Ambulatory Visit: Payer: Self-pay

## 2021-10-15 ENCOUNTER — Emergency Department: Payer: Medicaid Other

## 2021-10-15 DIAGNOSIS — I10 Essential (primary) hypertension: Secondary | ICD-10-CM | POA: Insufficient documentation

## 2021-10-15 DIAGNOSIS — G8929 Other chronic pain: Secondary | ICD-10-CM | POA: Diagnosis not present

## 2021-10-15 DIAGNOSIS — R52 Pain, unspecified: Secondary | ICD-10-CM | POA: Insufficient documentation

## 2021-10-15 DIAGNOSIS — R079 Chest pain, unspecified: Secondary | ICD-10-CM | POA: Diagnosis present

## 2021-10-15 DIAGNOSIS — R0602 Shortness of breath: Secondary | ICD-10-CM | POA: Insufficient documentation

## 2021-10-15 LAB — BASIC METABOLIC PANEL
Anion gap: 10 (ref 5–15)
BUN: 7 mg/dL (ref 6–20)
CO2: 24 mmol/L (ref 22–32)
Calcium: 9.5 mg/dL (ref 8.9–10.3)
Chloride: 99 mmol/L (ref 98–111)
Creatinine, Ser: 0.68 mg/dL (ref 0.61–1.24)
GFR, Estimated: >60 mL/min (ref >60)
Glucose, Bld: 132 mg/dL — ABNORMAL HIGH (ref 70–99)
Potassium: 3.7 mmol/L (ref 3.5–5.1)
Sodium: 133 mmol/L — ABNORMAL LOW (ref 135–145)

## 2021-10-15 LAB — CBC
HCT: 41.5 % (ref 39.0–52.0)
Hemoglobin: 14.2 g/dL (ref 13.0–17.0)
MCH: 34.2 pg — ABNORMAL HIGH (ref 26.0–34.0)
MCHC: 34.2 g/dL (ref 30.0–36.0)
MCV: 100 fL (ref 80.0–100.0)
Platelets: 179 10*3/uL (ref 150–400)
RBC: 4.15 MIL/uL — ABNORMAL LOW (ref 4.22–5.81)
RDW: 12.2 % (ref 11.5–15.5)
WBC: 8 10*3/uL (ref 4.0–10.5)
nRBC: 0 % (ref 0.0–0.2)

## 2021-10-15 LAB — TROPONIN I (HIGH SENSITIVITY): Troponin I (High Sensitivity): 6 ng/L (ref <18)

## 2021-10-15 MED ORDER — OXYCODONE-ACETAMINOPHEN 5-325 MG PO TABS
1.0000 | ORAL_TABLET | Freq: Once | ORAL | Status: AC
  Administered 2021-10-15: 1 via ORAL
  Filled 2021-10-15: qty 1

## 2021-10-15 MED ORDER — APIXABAN 5 MG PO TABS
5.0000 mg | ORAL_TABLET | Freq: Once | ORAL | Status: AC
  Administered 2021-10-15: 5 mg via ORAL
  Filled 2021-10-15: qty 1
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
## 2021-10-15 NOTE — Discharge Instructions (Signed)
Your Eliquis was called in to your pharmacy yesterday  Take this as prescribed  Follow-up with your pain specialist for your pain med refills

## 2021-10-15 NOTE — ED Provider Notes (Signed)
[facility] Provider Note    Event Date/Time   First MD Initiated Contact with Patient 10/15/21 2044     (approximate)   History   Chest Pain and Back Pain   HPI  Jose [name] is a 61 y.o. male with past medical history of PEs on Eliquis, alcohol use, hypertension, chronic chest pain, here with chest pain.  Patient states that he ran out of his Eliquis so he presents with chest pain.  Describes it as an aching, throbbing pain "around" his heart.  Pain is fairly constant, not particularly worse with deep inspiration.  He states he is short of breath but he is chronically short of breath.  Denies any pleurisy.  No leg swelling.  Is been about 2 days since he took his medications.  Initially, he states that he had not been seen for this though per review of records he was seen last night for this.  He states he did not get his medication refilled although he has a prescription for this sent in yesterday.  He is requesting for pain medication for his chest pain, and states he has been out of his OxyContin.  Denies any fevers.  No sputum production.  No other complaints.     Physical Exam   Triage Vital Signs: ED Triage Vitals  Enc Vitals Group     BP 10/15/21 1802 138/87     Pulse Rate 10/15/21 1802 87     Resp 10/15/21 1802 18     Temp 10/15/21 1802 98 F (36.7 C)     Temp src --      SpO2 10/15/21 1802 93 %     Weight --      Height --      Head Circumference --      Peak Flow --      Pain Score 10/15/21 1800 10     Pain Loc --      Pain Edu? --      Excl. in GC? --     Most recent vital signs: Vitals:   10/15/21 1802 10/15/21 2234  BP: 138/87 (!) 126/54  Pulse: 87 91  Resp: 18   Temp: 98 F (36.7 C) 98.1 F (36.7 C)  SpO2: 93% 95%     General: Awake, no distress.  CV:  Good peripheral perfusion.  Regular rate and rhythm.  No murmurs. Resp:  Normal effort.  No tachypnea.  No wheezes. Abd:  No distention.  No  tenderness. Other:  Calm, awake, oriented x4.  No lower extremity swelling or asymmetry.  No chest wall tenderness.   ED Results / Procedures / Treatments   Labs (all labs ordered are listed, but only abnormal results are displayed) Labs Reviewed  BASIC METABOLIC PANEL - Abnormal; Notable for the following components:      Result Value   Sodium 133 (*)    Glucose, Bld 132 (*)    All other components within normal limits  CBC - Abnormal; Notable for the following components:   RBC 4.15 (*)    MCH 34.2 (*)    All other components within normal limits  TROPONIN I (HIGH SENSITIVITY)  TROPONIN I (HIGH SENSITIVITY)     EKG Normal sinus rhythm, trickle rate 80.  PR 162, QRS 138, QTc 475.  No acute ST elevations or depressions when acute events of acute ischemic infarct.   RADIOLOGY Chest x-ray: Clear   I also independently reviewed and agree wit radiologist interpretations.  PROCEDURES:  Critical Care performed: No   MEDICATIONS ORDERED IN ED: Medications  oxyCODONE-acetaminophen (PERCOCET/ROXICET) 5-325 MG per tablet 1 tablet (1 tablet Oral Given 10/15/21 2234)  apixaban (ELIQUIS) tablet 5 mg (5 mg Oral Given 10/15/21 2233)     IMPRESSION / MDM / ASSESSMENT AND PLAN / ED COURSE  I reviewed the triage vital signs and the nursing notes.                              Ddx:  Chronic atypical chest pain, musculoskeletal chest pain, pleurisy related to PEs, ACS, GERD, gastritis, opiate seeking.   MDM:  61 year old male with extensive history of recurrent ED visits for chest pain related to history of PEs, here with recurrent chest pain.  His EKG on arrival here is nonischemic and troponins are negative despite constant pain for greater than 48 hours, making ACS highly unlikely.  CBC shows no leukocytosis or anemia.  BMP is unremarkable with normal renal function.  Chest x-ray reviewed, is clear with no acute disease.  States he has been out of his Eliquis.  I reviewed his  recent records and he was just seen last 24 hours ago for the same complaints and had his Eliquis refilled.  He states he did not know that this has been refilled.  Clinically, he appears comfortable, is not hypoxic, has no tachypnea or tachycardia, and no evidence to suggest significant recurrence of PE.  He was given a dose of Eliquis here.  Otherwise, I discussed that I was unable to refill his chronic opiates, and that he would need to see his PCP or pain clinic for this.  As mentioned, he just had his medications resent yesterday.  Will discharge with outpatient follow-up.   MEDICATIONS GIVEN IN ED: Medications  oxyCODONE-acetaminophen (PERCOCET/ROXICET) 5-325 MG per tablet 1 tablet (1 tablet Oral Given 10/15/21 2234)  apixaban (ELIQUIS) tablet 5 mg (5 mg Oral Given 10/15/21 2233)     Consults:  None   EMR reviewed  Multiple prior ED visits for chest pain, including yesterday      FINAL CLINICAL IMPRESSION(S) / ED DIAGNOSES   Final diagnoses:  Chronic chest pain     Rx / DC Orders   ED Discharge Orders     None        Note:  This document was prepared using Dragon voice recognition software and may include unintentional dictation errors.   Duffy Bruce, MD 10/15/21 2250

## 2021-10-16 ENCOUNTER — Emergency Department
Admission: EM | Admit: 2021-10-16 | Discharge: 2021-10-16 | Disposition: A | Discharge status: Home / Self Care | Payer: Medicaid Other | Source: Home / Self Care | Attending: Emergency Medicine | Admitting: Emergency Medicine

## 2021-10-16 ENCOUNTER — Other Ambulatory Visit: Payer: Self-pay

## 2021-10-16 DIAGNOSIS — G8929 Other chronic pain: Secondary | ICD-10-CM

## 2021-10-16 DIAGNOSIS — R52 Pain, unspecified: Secondary | ICD-10-CM | POA: Insufficient documentation

## 2021-10-16 MED ORDER — OXYCODONE-ACETAMINOPHEN 5-325 MG PO TABS
1.0000 | ORAL_TABLET | Freq: Once | ORAL | Status: AC
  Administered 2021-10-16: 1 via ORAL
  Filled 2021-10-16: qty 1

## 2021-10-16 NOTE — ED Triage Notes (Signed)
Pt presents via POV c/o "chest pains and back pains. I have liver problems and I dont have any medicine". Reports here last PM but was not seen by provider.

## 2021-10-16 NOTE — ED Provider Notes (Signed)
[facility] Provider Note    Event Date/Time   First MD Initiated Contact with Patient 10/16/21 2034     (approximate)   History   Diffuse pain   HPI  Jose [name] is a 61 y.o. male with a history of polysubstance abuse, pulmonary embolism who presents with diffuse pain.  He attributes this to being out of all of his medications, primarily his pain medication.  He also states that he is out of his Eliquis.  I pointed out that this was refilled for him 1 day ago but he notes that he has not picked it up yet.  He was seen and evaluated yesterday for similar complaint and then 2 days ago as well     Physical Exam   Triage Vital Signs: ED Triage Vitals  Enc Vitals Group     BP 10/16/21 2022 (!) 126/96     Pulse Rate 10/16/21 2022 85     Resp 10/16/21 2022 19     Temp 10/16/21 2022 98.2 F (36.8 C)     Temp Source 10/16/21 2022 Oral     SpO2 10/16/21 2022 95 %     Weight --      Height --      Head Circumference --      Peak Flow --      Pain Score 10/16/21 2021 10     Pain Loc --      Pain Edu? --      Excl. in GC? --     Most recent vital signs: Vitals:   10/16/21 2022  BP: (!) 126/96  Pulse: 85  Resp: 19  Temp: 98.2 F (36.8 C)  SpO2: 95%     General: Awake, no distress.  CV:  Good peripheral perfusion.  Regular rate and rhythm, no murmurs Resp:  Normal effort.  Clear to auscultation bilaterally abd:  No distention.     ED Results / Procedures / Treatments   Labs (all labs ordered are listed, but only abnormal results are displayed) Labs Reviewed - No data to display   EKG  ED ECG REPORT I, Jene Every, the attending physician, personally viewed and interpreted this ECG.  Date: 10/16/2021  Rhythm: normal sinus rhythm QRS Axis: normal Intervals: Right bundle branch block ST/T Wave abnormalities: normal Narrative Interpretation: no evidence of acute ischemia    RADIOLOGY     PROCEDURES:  Critical  Care performed:   Procedures   MEDICATIONS ORDERED IN ED: Medications  oxyCODONE-acetaminophen (PERCOCET/ROXICET) 5-325 MG per tablet 1 tablet (has no administration in time range)     IMPRESSION / MDM / ASSESSMENT AND PLAN / ED COURSE  I reviewed the triage vital signs and the nursing notes.   Patient had appropriate work-up yesterday which was quite reassuring, he seems to be representing to the emergency department frequently and in an attempt to obtain a prescription for pain medications  I informed him that we will not be filling a narcotics prescription for chronic pain, I did tell him that we would give him a single dose here but that he cannot come to the emergency department repeatedly for narcotics  His EKG is unchanged from prior, he denies chest pain to me.  No shortness of breath.  Encouraged him to refill his Eliquis prescription.  We will refer him to pain management.         FINAL CLINICAL IMPRESSION(S) / ED DIAGNOSES   Final diagnoses:  Other chronic pain  Rx / DC Orders   ED Discharge Orders     None        Note:  This document was prepared using Dragon voice recognition software and may include unintentional dictation errors.   Jene Every, MD 10/16/21 2115

## 2021-10-19 ENCOUNTER — Encounter: Payer: Self-pay | Admitting: *Deleted

## 2021-10-19 ENCOUNTER — Emergency Department
Admission: EM | Admit: 2021-10-19 | Discharge: 2021-10-19 | Disposition: A | Discharge status: Home / Self Care | Payer: Medicaid Other | Attending: Emergency Medicine | Admitting: Emergency Medicine

## 2021-10-19 ENCOUNTER — Other Ambulatory Visit: Payer: Self-pay

## 2021-10-19 DIAGNOSIS — G8929 Other chronic pain: Secondary | ICD-10-CM | POA: Insufficient documentation

## 2021-10-19 DIAGNOSIS — R079 Chest pain, unspecified: Secondary | ICD-10-CM | POA: Diagnosis present

## 2021-10-19 LAB — CBC
HCT: 42 % (ref 39.0–52.0)
Hemoglobin: 14.1 g/dL (ref 13.0–17.0)
MCH: 33.8 pg (ref 26.0–34.0)
MCHC: 33.6 g/dL (ref 30.0–36.0)
MCV: 100.7 fL — ABNORMAL HIGH (ref 80.0–100.0)
Platelets: 171 10*3/uL (ref 150–400)
RBC: 4.17 MIL/uL — ABNORMAL LOW (ref 4.22–5.81)
RDW: 12.2 % (ref 11.5–15.5)
WBC: 8.2 10*3/uL (ref 4.0–10.5)
nRBC: 0 % (ref 0.0–0.2)

## 2021-10-19 LAB — BASIC METABOLIC PANEL
Anion gap: 8 (ref 5–15)
BUN: 7 mg/dL (ref 6–20)
CO2: 28 mmol/L (ref 22–32)
Calcium: 9.7 mg/dL (ref 8.9–10.3)
Chloride: 99 mmol/L (ref 98–111)
Creatinine, Ser: 0.75 mg/dL (ref 0.61–1.24)
GFR, Estimated: >60 mL/min (ref >60)
Glucose, Bld: 99 mg/dL (ref 70–99)
Potassium: 3.5 mmol/L (ref 3.5–5.1)
Sodium: 135 mmol/L (ref 135–145)

## 2021-10-19 LAB — TROPONIN I (HIGH SENSITIVITY): Troponin I (High Sensitivity): 8 ng/L (ref <18)

## 2021-10-19 MED ORDER — OXYCODONE-ACETAMINOPHEN 5-325 MG PO TABS
1.0000 | ORAL_TABLET | Freq: Once | ORAL | Status: AC
  Administered 2021-10-19: 1 via ORAL
  Filled 2021-10-19: qty 1

## 2021-10-19 MED ORDER — APIXABAN 5 MG PO TABS
5.0000 mg | ORAL_TABLET | Freq: Once | ORAL | Status: AC
  Administered 2021-10-19: 5 mg via ORAL
  Filled 2021-10-19: qty 1

## 2021-10-19 NOTE — ED Triage Notes (Signed)
Pt ambulatory to triage.  Pt has chest pain, vomiting and diarrhea.   Pt did not fill prescriptions.  Pt alert  speech clear.

## 2021-10-19 NOTE — ED Notes (Signed)
Patient ambulated to restroom with a steady gait

## 2021-10-19 NOTE — ED Notes (Signed)
Patient sleeping, awaiting d/c.

## 2021-10-19 NOTE — ED Provider Notes (Addendum)
[facility] Provider Note    Event Date/Time   First MD Initiated Contact with Patient 10/19/21 2126     (approximate)   History   Chest Pain   HPI Jose [name] is a 61 y.o. male with a history of chronic chest pain who presents for chest pain.  Patient states that he has been unable to get his medications after he ran out before a renewal of his pain medicine.  Patient states 10/10 nonradiating central chest pain that has no exacerbating or relieving factors and is constant.  Patient refusing to answer any further questions before administration of pain medicine     Physical Exam   Triage Vital Signs: ED Triage Vitals  Enc Vitals Group     BP 10/19/21 2015 (!) 141/84     Pulse Rate 10/19/21 2015 100     Resp 10/19/21 2015 18     Temp 10/19/21 2015 98.2 F (36.8 C)     Temp Source 10/19/21 2015 Oral     SpO2 10/19/21 2015 96 %     Weight 10/19/21 2013 145 lb (65.8 kg)     Height 10/19/21 2013 5\' 8"  (1.727 m)     Head Circumference --      Peak Flow --      Pain Score 10/19/21 2013 10     Pain Loc --      Pain Edu? --      Excl. in GC? --     Most recent vital signs: Vitals:   10/19/21 2015 10/19/21 2248  BP: (!) 141/84 137/79  Pulse: 100 98  Resp: 18 17  Temp: 98.2 F (36.8 C) 98 F (36.7 C)  SpO2: 96% 97%    General: Awake, no distress.  CV:  Good peripheral perfusion.  Resp:  Normal effort.  Abd:  No distention.  Other:  Disheveled middle-aged Caucasian male laying in bed in no distress   ED Results / Procedures / Treatments   Labs (all labs ordered are listed, but only abnormal results are displayed) Labs Reviewed  CBC - Abnormal; Notable for the following components:      Result Value   RBC 4.17 (*)    MCV 100.7 (*)    All other components within normal limits  BASIC METABOLIC PANEL  TROPONIN I (HIGH SENSITIVITY)  TROPONIN I (HIGH SENSITIVITY)     EKG ED ECG REPORT I, 10/21/21, the attending  physician, personally viewed and interpreted this ECG.  Date: 10/19/2021 EKG Time: 2017 Rate: 96 Rhythm: normal sinus rhythm QRS Axis: normal Intervals: RBBB ST/T Wave abnormalities: normal Narrative Interpretation: RBBB.  No evidence of acute ischemia   PROCEDURES:  Critical Care performed: No  .1-3 Lead EKG Interpretation Performed by: 2018, MD Authorized by: Merwyn Katos, MD     Interpretation: normal     ECG rate:  95   ECG rate assessment: normal     Rhythm: sinus rhythm     Ectopy: none     Conduction: normal     MEDICATIONS ORDERED IN ED: Medications  oxyCODONE-acetaminophen (PERCOCET/ROXICET) 5-325 MG per tablet 1 tablet (1 tablet Oral Given 10/19/21 2151)  apixaban (ELIQUIS) tablet 5 mg (5 mg Oral Given 10/19/21 2151)     IMPRESSION / MDM / ASSESSMENT AND PLAN / ED COURSE  I reviewed the triage vital signs and the nursing notes.  The patient is on the cardiac monitor to evaluate for evidence of arrhythmia and/or significant heart rate changes. Workup: ECG, CBC, BMP, Troponin Findings: ECG: No overt evidence of STEMI. No evidence of Brugadas sign, delta wave, epsilon wave, significantly prolonged QTc, or malignant arrhythmia HS Troponin: Negative x1 Other Labs unremarkable for emergent problems.  Last Stress Test:  2022 Last Heart Catheterization:  2019 HEART Score: 4  Given History, Exam, and Workup I have low suspicion for ACS, Pneumothorax, Pneumonia, Pulmonary Embolus, Tamponade, Aortic Dissection or other emergent problem as a cause for this presentation.   Reassesment: Prior to discharge patients pain was at baseline and they were well appearing.  Disposition:  Discharge. Strict return precautions discussed with patient with full understanding. Advised patient to follow up promptly with primary care provider       FINAL CLINICAL IMPRESSION(S) / ED DIAGNOSES   Final diagnoses:  Chronic chest pain      Rx / DC Orders   ED Discharge Orders     None        Note:  This document was prepared using Dragon voice recognition software and may include unintentional dictation errors.   Merwyn Katos, MD 10/19/21 Janetta Hora    Merwyn Katos, MD 10/19/21 [phone]

## 2021-10-19 NOTE — ED Notes (Signed)
Pt upset due to not getting prescription for pain medication. Pt demanding wheelchair to get back to waiting room.

## 2021-10-24 ENCOUNTER — Inpatient Hospital Stay
Admission: EM | Admit: 2021-10-24 | Discharge: 2021-10-27 | DRG: 175 | Disposition: A | Discharge status: Home / Self Care | Payer: Medicaid Other | Attending: Internal Medicine | Admitting: Internal Medicine

## 2021-10-24 ENCOUNTER — Other Ambulatory Visit: Payer: Self-pay

## 2021-10-24 ENCOUNTER — Emergency Department: Payer: Medicaid Other

## 2021-10-24 DIAGNOSIS — B1920 Unspecified viral hepatitis C without hepatic coma: Secondary | ICD-10-CM | POA: Diagnosis present

## 2021-10-24 DIAGNOSIS — F1590 Other stimulant use, unspecified, uncomplicated: Secondary | ICD-10-CM | POA: Diagnosis present

## 2021-10-24 DIAGNOSIS — I10 Essential (primary) hypertension: Secondary | ICD-10-CM | POA: Diagnosis present

## 2021-10-24 DIAGNOSIS — E43 Unspecified severe protein-calorie malnutrition: Secondary | ICD-10-CM | POA: Insufficient documentation

## 2021-10-24 DIAGNOSIS — I951 Orthostatic hypotension: Secondary | ICD-10-CM | POA: Diagnosis present

## 2021-10-24 DIAGNOSIS — Z79899 Other long term (current) drug therapy: Secondary | ICD-10-CM

## 2021-10-24 DIAGNOSIS — I2699 Other pulmonary embolism without acute cor pulmonale: Principal | ICD-10-CM | POA: Diagnosis present

## 2021-10-24 DIAGNOSIS — R55 Syncope and collapse: Secondary | ICD-10-CM

## 2021-10-24 DIAGNOSIS — Z20822 Contact with and (suspected) exposure to covid-19: Secondary | ICD-10-CM | POA: Diagnosis present

## 2021-10-24 DIAGNOSIS — Z5900 Homelessness unspecified: Secondary | ICD-10-CM | POA: Diagnosis not present

## 2021-10-24 DIAGNOSIS — Z9119 Patient's noncompliance with other medical treatment and regimen due to financial hardship: Secondary | ICD-10-CM

## 2021-10-24 DIAGNOSIS — F17210 Nicotine dependence, cigarettes, uncomplicated: Secondary | ICD-10-CM | POA: Diagnosis present

## 2021-10-24 DIAGNOSIS — B182 Chronic viral hepatitis C: Secondary | ICD-10-CM | POA: Diagnosis present

## 2021-10-24 DIAGNOSIS — Z5902 Unsheltered homelessness: Secondary | ICD-10-CM

## 2021-10-24 DIAGNOSIS — F1994 Other psychoactive substance use, unspecified with psychoactive substance-induced mood disorder: Secondary | ICD-10-CM | POA: Diagnosis present

## 2021-10-24 DIAGNOSIS — Z23 Encounter for immunization: Secondary | ICD-10-CM

## 2021-10-24 DIAGNOSIS — Z9114 Patient's other noncompliance with medication regimen: Secondary | ICD-10-CM

## 2021-10-24 DIAGNOSIS — F17200 Nicotine dependence, unspecified, uncomplicated: Secondary | ICD-10-CM | POA: Diagnosis present

## 2021-10-24 DIAGNOSIS — F1020 Alcohol dependence, uncomplicated: Secondary | ICD-10-CM | POA: Diagnosis present

## 2021-10-24 DIAGNOSIS — Z91199 Patient's noncompliance with other medical treatment and regimen due to unspecified reason: Secondary | ICD-10-CM

## 2021-10-24 DIAGNOSIS — Z7901 Long term (current) use of anticoagulants: Secondary | ICD-10-CM

## 2021-10-24 DIAGNOSIS — Z6822 Body mass index (BMI) 22.0-22.9, adult: Secondary | ICD-10-CM

## 2021-10-24 LAB — URINALYSIS, ROUTINE W REFLEX MICROSCOPIC
Bilirubin Urine: NEGATIVE
Glucose, UA: NEGATIVE mg/dL
Hgb urine dipstick: NEGATIVE
Ketones, ur: NEGATIVE mg/dL
Leukocytes,Ua: NEGATIVE
Nitrite: NEGATIVE
Protein, ur: NEGATIVE mg/dL
Specific Gravity, Urine: 1.01 (ref 1.005–1.030)
pH: 5.5 (ref 5.0–8.0)

## 2021-10-24 LAB — COMPREHENSIVE METABOLIC PANEL
ALT: 27 U/L (ref 0–44)
AST: 46 U/L — ABNORMAL HIGH (ref 15–41)
Albumin: 3.7 g/dL (ref 3.5–5.0)
Alkaline Phosphatase: 66 U/L (ref 38–126)
Anion gap: 8 (ref 5–15)
BUN: 9 mg/dL (ref 6–20)
CO2: 27 mmol/L (ref 22–32)
Calcium: 9.4 mg/dL (ref 8.9–10.3)
Chloride: 99 mmol/L (ref 98–111)
Creatinine, Ser: 0.89 mg/dL (ref 0.61–1.24)
GFR, Estimated: >60 mL/min (ref >60)
Glucose, Bld: 123 mg/dL — ABNORMAL HIGH (ref 70–99)
Potassium: 3.4 mmol/L — ABNORMAL LOW (ref 3.5–5.1)
Sodium: 134 mmol/L — ABNORMAL LOW (ref 135–145)
Total Bilirubin: 0.6 mg/dL (ref 0.3–1.2)
Total Protein: 7.3 g/dL (ref 6.5–8.1)

## 2021-10-24 LAB — CBC WITH DIFFERENTIAL/PLATELET
Abs Immature Granulocytes: 0.04 10*3/uL (ref 0.00–0.07)
Basophils Absolute: 0 10*3/uL (ref 0.0–0.1)
Basophils Relative: 0 %
Eosinophils Absolute: 0.1 10*3/uL (ref 0.0–0.5)
Eosinophils Relative: 1 %
HCT: 38.6 % — ABNORMAL LOW (ref 39.0–52.0)
Hemoglobin: 13 g/dL (ref 13.0–17.0)
Immature Granulocytes: 1 %
Lymphocytes Relative: 25 %
Lymphs Abs: 2 10*3/uL (ref 0.7–4.0)
MCH: 33.8 pg (ref 26.0–34.0)
MCHC: 33.7 g/dL (ref 30.0–36.0)
MCV: 100.3 fL — ABNORMAL HIGH (ref 80.0–100.0)
Monocytes Absolute: 1 10*3/uL (ref 0.1–1.0)
Monocytes Relative: 12 %
Neutro Abs: 5 10*3/uL (ref 1.7–7.7)
Neutrophils Relative %: 61 %
Platelets: 145 10*3/uL — ABNORMAL LOW (ref 150–400)
RBC: 3.85 MIL/uL — ABNORMAL LOW (ref 4.22–5.81)
RDW: 11.9 % (ref 11.5–15.5)
WBC: 8.2 10*3/uL (ref 4.0–10.5)
nRBC: 0 % (ref 0.0–0.2)

## 2021-10-24 LAB — HEPARIN LEVEL (UNFRACTIONATED): Heparin Unfractionated: <0.1 IU/mL — ABNORMAL LOW (ref 0.30–0.70)

## 2021-10-24 LAB — PROTIME-INR
INR: 1.1 (ref 0.8–1.2)
Prothrombin Time: 14.1 seconds (ref 11.4–15.2)

## 2021-10-24 LAB — TSH: TSH: 2.032 u[IU]/mL (ref 0.350–4.500)

## 2021-10-24 LAB — RESP PANEL BY RT-PCR (FLU A&B, COVID) ARPGX2
Influenza A by PCR: NEGATIVE
Influenza B by PCR: NEGATIVE
SARS Coronavirus 2 by RT PCR: NEGATIVE

## 2021-10-24 LAB — APTT: aPTT: 27 seconds (ref 24–36)

## 2021-10-24 LAB — MAGNESIUM: Magnesium: 1.7 mg/dL (ref 1.7–2.4)

## 2021-10-24 LAB — TROPONIN I (HIGH SENSITIVITY)
Troponin I (High Sensitivity): 6 ng/L (ref <18)
Troponin I (High Sensitivity): 7 ng/L (ref <18)

## 2021-10-24 LAB — PHOSPHORUS: Phosphorus: 4 mg/dL (ref 2.5–4.6)

## 2021-10-24 MED ORDER — KETOROLAC TROMETHAMINE 15 MG/ML IJ SOLN
7.5000 mg | Freq: Four times a day (QID) | INTRAMUSCULAR | Status: DC | PRN
  Filled 2021-10-24: qty 1

## 2021-10-24 MED ORDER — ONDANSETRON HCL 4 MG PO TABS: 4.0000 mg | ORAL_TABLET | Freq: Four times a day (QID) | ORAL | Status: DC | PRN

## 2021-10-24 MED ORDER — THIAMINE HCL 100 MG/ML IJ SOLN
100.0000 mg | Freq: Every day | INTRAMUSCULAR | Status: DC
  Filled 2021-10-24: qty 2

## 2021-10-24 MED ORDER — ACETAMINOPHEN 325 MG PO TABS: 650.0000 mg | ORAL_TABLET | Freq: Four times a day (QID) | ORAL | Status: DC | PRN

## 2021-10-24 MED ORDER — ONDANSETRON HCL 4 MG/2ML IJ SOLN
4.0000 mg | Freq: Four times a day (QID) | INTRAMUSCULAR | Status: DC | PRN
  Administered 2021-10-25: 4 mg via INTRAVENOUS
  Filled 2021-10-24: qty 2

## 2021-10-24 MED ORDER — HEPARIN BOLUS VIA INFUSION
4000.0000 [IU] | Freq: Once | INTRAVENOUS | Status: AC
  Administered 2021-10-25: 4000 [IU] via INTRAVENOUS
  Filled 2021-10-24: qty 4000

## 2021-10-24 MED ORDER — THIAMINE HCL 100 MG PO TABS
100.0000 mg | ORAL_TABLET | Freq: Every day | ORAL | Status: DC
  Administered 2021-10-25 – 2021-10-27 (×4): 100 mg via ORAL
  Filled 2021-10-24 (×4): qty 1

## 2021-10-24 MED ORDER — FOLIC ACID 1 MG PO TABS
1.0000 mg | ORAL_TABLET | Freq: Every day | ORAL | Status: DC
  Administered 2021-10-25 – 2021-10-27 (×4): 1 mg via ORAL
  Filled 2021-10-24 (×4): qty 1

## 2021-10-24 MED ORDER — MORPHINE SULFATE (PF) 2 MG/ML IV SOLN
1.0000 mg | INTRAVENOUS | Status: AC | PRN
  Administered 2021-10-25 (×3): 1 mg via INTRAVENOUS
  Filled 2021-10-24 (×3): qty 1

## 2021-10-24 MED ORDER — SODIUM CHLORIDE 0.9 % IV BOLUS
500.0000 mL | Freq: Once | INTRAVENOUS | Status: AC
  Administered 2021-10-24: 500 mL via INTRAVENOUS

## 2021-10-24 MED ORDER — IOHEXOL 350 MG/ML SOLN
100.0000 mL | Freq: Once | INTRAVENOUS | Status: AC | PRN
  Administered 2021-10-24: 100 mL via INTRAVENOUS

## 2021-10-24 MED ORDER — HEPARIN (PORCINE) 25000 UT/250ML-% IV SOLN
1100.0000 [IU]/h | INTRAVENOUS | Status: DC
  Administered 2021-10-25: 1100 [IU]/h via INTRAVENOUS
  Filled 2021-10-24: qty 250

## 2021-10-24 MED ORDER — ADULT MULTIVITAMIN W/MINERALS CH
1.0000 | ORAL_TABLET | Freq: Every day | ORAL | Status: DC
  Administered 2021-10-25 – 2021-10-27 (×4): 1 via ORAL
  Filled 2021-10-24 (×4): qty 1

## 2021-10-24 MED ORDER — OXYCODONE HCL 5 MG PO TABS
5.0000 mg | ORAL_TABLET | Freq: Once | ORAL | Status: AC
Start: 2021-10-24 — End: 2021-10-24
  Administered 2021-10-24: 5 mg via ORAL
  Filled 2021-10-24: qty 1

## 2021-10-24 MED ORDER — LORAZEPAM 1 MG PO TABS: 1.0000 mg | ORAL_TABLET | ORAL | Status: DC | PRN

## 2021-10-24 MED ORDER — ACETAMINOPHEN 650 MG RE SUPP: 650.0000 mg | Freq: Four times a day (QID) | RECTAL | Status: DC | PRN

## 2021-10-24 MED ORDER — LORAZEPAM 2 MG/ML IJ SOLN: 1.0000 mg | INTRAMUSCULAR | Status: DC | PRN

## 2021-10-24 NOTE — Hospital Course (Signed)
Mr. Jose [name] is a 61 year old male who is well-known to the emergency department service, frequent ED visitation, history of PE, history of polysubstance abuse, hepatitis C, hypertension, medication noncompliance, homelessness, who presents emergency department for chief concerns of syncopal event and loss of consciousness with chest pain.  Initial vitals in the emergency department showed temperature of 98.5, respiration rate of 22, heart rate of 90, blood pressure of 146/100, SPO2 of 100% on room air.  Serum sodium is 134, potassium 3.4, chloride of 99, bicarb 27, BUN of 9, serum creatinine of 0.89, nonfasting blood glucose 123, GFR greater than 60.  WBC is 8.2, hemoglobin 13, platelets of 145.  High sensitive troponin was 6.  COVID/influenza A/influenza B PCR standing order has been placed, pending collection.  Per ED nursing note, patient received aspirin 324 mg and nitroglycerin spray on route via EMS.  In the emergency department patient was given sodium chloride 500 mL bolus, given 1 dose of oxycodone 5 mg p.o.  In the emergency department a CT scan of the head without contrast was ordered and read as chronic atrophic and ischemic changes without acute abnormality.  CT of the cervical spine was read as multilevel degenerative changes without acute abnormality.  CTA of the chest for PE was ordered and read as changes consistent with bilateral pulmonary emboli with mild right heart strain.  The degree of pulmonary emboli has overall worsened slightly in the interval from the prior exam.

## 2021-10-24 NOTE — Consult Note (Signed)
ANTICOAGULATION CONSULT NOTE - Consult  Pharmacy Consult for Heparin Indication: pulmonary embolus (worsening of prior)  No Known Allergies  Patient Measurements: Height: 5\' 8"  (172.7 cm) Weight: 68.9 kg (152 lb) IBW/kg (Calculated) : 68.4 Heparin Dosing Weight: 65.8kg (based on recent admit)  Vital Signs: Temp: 98.5 F (36.9 C) (01/31 1948) Temp Source: Oral (01/31 1948) BP: 147/84 (01/31 2130) Pulse Rate: 80 (01/31 2130)  Labs: Recent Labs    10/24/21 2028  HGB 13.0  HCT 38.6*  PLT 145*  CREATININE 0.89  TROPONINIHS 6    Estimated Creatinine Clearance: 86.5 mL/min (by C-G formula based on SCr of 0.89 mg/dL).   Medications:  PTA: Eliquis (per notes pt reports non-compliance and presents with worsening)   Assessment: 61 yo male w/ h/o HTN, polysubstance abuse, hepatitis C, and recently admitted with diagnosis of multiple acute segmental to subsegmental pulmonary emboli discharged on Eliquis now presenting with c/o chest pain and self-reported Eliquis non-compliance. Pharmacy consulted for mgmt of hep gtt.   Date Time aPTT/HL Rate/Comment       Baseline Labs: aPTT - ordered INR - ordered Hgb - 13 Plts - 145  Goal of Therapy:  Heparin level 0.3-0.7 units/ml aPTT 66-102 seconds Monitor platelets by anticoagulation protocol: Yes   Plan:  Pt self-reported non-compliance with Eliquis, leading to current presentation and worsening of PE on CT imaging. Ordered stat HL to confirm eliquis washed out, but anticipate will see lab correlation at first interval check. Bolus heparin 4000 units x1; then start heparin infusion rate at 1100 units/hr Based on recent prior admission pt was therapeutic on heparin @1100  un/hr. Used the same heparin dosing wt for titration. Check HL in 6 hours after infusion start and rate changes; check daily once consecutively therapeutic. Once labs correlate transition from aPTT > HL titration. CTM with daily CBC per protocol while on IV  heparin   47, PharmD, [facility] Clinical Pharmacist 10/24/2021 9:53 PM

## 2021-10-24 NOTE — ED Notes (Signed)
Dr. Funke at the bedside 

## 2021-10-24 NOTE — H&P (Addendum)
History and Physical   Jose [name] [id number] DOB: 1961-08-01 DOA: 10/24/2021  PCP: Pcp, No  Patient coming from: hot dog stand  I have personally briefly reviewed patient's old medical records in [place].  Chief Concern: syncope   HPI: Mr. Jose [name] is a 61 year old male who is well-known to the emergency department service, frequent ED visitation, history of PE, history of polysubstance abuse, hepatitis C, hypertension, medication noncompliance, homelessness, who presents emergency department for chief concerns of syncopal event and loss of consciousness with chest pain.  Initial vitals in the emergency department showed temperature of 98.5, respiration rate of 22, heart rate of 90, blood pressure of 146/100, SPO2 of 100% on room air.  Serum sodium is 134, potassium 3.4, chloride of 99, bicarb 27, BUN of 9, serum creatinine of 0.89, nonfasting blood glucose 123, GFR greater than 60.  WBC is 8.2, hemoglobin 13, platelets of 145.  High sensitive troponin was 6.  COVID/influenza A/influenza B PCR standing order has been placed, pending collection.  Per ED nursing note, patient received aspirin 324 mg and nitroglycerin spray on route via EMS.  In the emergency department patient was given sodium chloride 500 mL bolus, given 1 dose of oxycodone 5 mg p.o.  In the emergency department a CT scan of the head without contrast was ordered and read as chronic atrophic and ischemic changes without acute abnormality.  CT of the cervical spine was read as multilevel degenerative changes without acute abnormality.  CTA of the chest for PE was ordered and read as changes consistent with bilateral pulmonary emboli with mild right heart strain.  The degree of pulmonary emboli has overall worsened slightly in the interval from the prior exam.  At bedside he was able to tell me his name, age, current year.  He does not appear to be in acute distress.  He reports that he passed  out, slid on wall. He was at [employer] place. He endorses chest pain and shortness of breath, on and off for the last two days. He states he was told he was out for seconds.  He reports that the [employer] worker told him to sit down relax and EMS was called.  He does not have money for the Eliquis, his last doses was 4-5 days ago. He endorses nausea and vomiting (4-5x).  Social history: He lives on the street, he is homeless. He formerly worked on [employer]. He smokes cigarettes, at his peak, he smokes 1/2 ppd. He denies drug use.   ROS: Constitutional: no weight change, no fever ENT/Mouth: no sore throat, no rhinorrhea Eyes: no eye pain, no vision changes Cardiovascular: + chest pain, + dyspnea,  no edema, no palpitations Respiratory: no cough, no sputum, no wheezing Gastrointestinal: no nausea, no vomiting, no diarrhea, no constipation Genitourinary: no urinary incontinence, no dysuria, no hematuria Musculoskeletal: no arthralgias, no myalgias Skin: no skin lesions, no pruritus, Neuro: + weakness, no loss of consciousness, no syncope Psych: no anxiety, no depression, + decrease appetite Heme/Lymph: no bruising, no bleeding  ED Course: Discussed with emergency medicine provider, patient requiring hospitalization worsening PE, not taking his home Eliquis.  Assessment/Plan  Principal Problem:   Pulmonary embolism (HCC) Active Problems:   Hepatitis C   Alcohol use disorder, severe, dependence (HCC)   Stimulant use disorder (HCC) (cocaine)   Tobacco use disorder   Substance induced mood disorder ([place])   Homeless   Patient's noncompliance with other medical treatment and regimen due to  financial hardship   Essential hypertension   Syncope and collapse    Cardiovascular and Mediastinum Essential hypertension Assessment & Plan - Resumed home amlodipine 5 mg daily  * Syncope, presumed secondary to worsening Pulmonary embolism (HCC) Assessment & Plan - Worsening PE  burden, patient has not take his as Eliquis for 3 to 4 days - He states that he cannot afford his medication - Patient is homeless - [facility] consulted for medication assistance - Heparin GGT given that there is right strain burden  Other Patient's noncompliance with other medical treatment and regimen due to financial hardship Assessment & Plan - TOC consulted  Chart reviewed.   DVT prophylaxis: Heparin GGT Code Status: Full code Diet: Heart healthy Family Communication: no, he doesn't have anyone he wants me to call Disposition Plan: Pending TOC Consults called: TOC Admission status: Progressive cardiac, observation, telemetry  Past Medical History:  Diagnosis Date   Hep C w/o coma, chronic ([place])    Hypertension    Polysubstance abuse ([place])    Past Surgical History:  Procedure Laterality Date   ESOPHAGOGASTRODUODENOSCOPY (EGD) WITH PROPOFOL N/A 03/02/2019   Procedure: ESOPHAGOGASTRODUODENOSCOPY (EGD) WITH PROPOFOL;  Surgeon: Georganna Skeans, MD;  Location: [facility];  Service: General;  Laterality: N/A;   IR GASTROSTOMY TUBE REMOVAL  05/04/2019   PEG PLACEMENT N/A 03/02/2019   Procedure: PERCUTANEOUS ENDOSCOPIC GASTROSTOMY (PEG) PLACEMENT;  Surgeon: Georganna Skeans, MD;  Location: [facility];  Service: General;  Laterality: N/A;   PERCUTANEOUS TRACHEOSTOMY N/A 02/12/2019   Procedure: PERCUTANEOUS TRACHEOSTOMY;  Surgeon: Georganna Skeans, MD;  Location: [facility];  Service: General;  Laterality: N/A;   Social History:  reports that he has been smoking cigarettes. He has a 22.50 pack-year smoking history. He has never used smokeless tobacco. He reports current alcohol use of about 2.0 standard drinks per week. He reports current drug use. Drug: Marijuana.  No Known Allergies History reviewed. No pertinent family history. Family history: Family history reviewed and not pertinent  Prior to Admission medications   Medication Sig Start Date End Date Taking? Authorizing Provider   amLODipine (NORVASC) 5 MG tablet Take 1 tablet (5 mg total) by mouth daily. 10/11/21 11/10/21 Yes Carrie Mew, MD  apixaban (ELIQUIS) 5 MG TABS tablet Take 1 tablet (5 mg total) by mouth 2 (two) times daily. 10/14/21  Yes Vladimir Crofts, MD  ibuprofen (ADVIL) 600 MG tablet Take 1 tablet (600 mg total) by mouth every 8 (eight) hours as needed for moderate pain. Patient not taking: Reported on 10/06/2021 09/10/21   Duffy Bruce, MD   Physical Exam: Vitals:   10/25/21 0000 10/25/21 0030 10/25/21 0105 10/25/21 0130  BP: 130/90 128/78 124/78 (!) 151/91  Pulse: 75 68 73 73  Resp: 15 14 (!) 23 18  Temp:      TempSrc:      SpO2: 97% 99% 95% 98%  Weight:      Height:       Constitutional: appears older than chronological age, NAD, calm, comfortable Eyes: PERRL, lids and conjunctivae normal ENMT: Mucous membranes are moist. Posterior pharynx clear of any exudate or lesions. Age-appropriate dentition. Hearing appropriate Neck: normal, supple, no masses, no thyromegaly Respiratory: clear to auscultation bilaterally, no wheezing, no crackles. Normal respiratory effort. No accessory muscle use.  Cardiovascular: Regular rate and rhythm, no murmurs / rubs / gallops. No extremity edema. 2+ pedal pulses. No carotid bruits.  Abdomen: no tenderness, no masses palpated, no hepatosplenomegaly. Bowel sounds positive.  Musculoskeletal: no clubbing / cyanosis. No joint  deformity upper and lower extremities. Good ROM, no contractures, no atrophy. Normal muscle tone.  Skin: no rashes, lesions, ulcers. No induration Neurologic: Sensation intact. Strength 5/5 in all 4.  Psychiatric: Normal judgment and insight. Alert and oriented x 3. Normal mood.   EKG: independently reviewed, showing sinus rhythm with rate of 92, right bundle branch block, QTC 479  Chest x-ray on Admission: I personally reviewed and I agree with radiologist reading as below.  CT HEAD WO CONTRAST (5MM)  Result Date: 10/24/2021 CLINICAL  DATA:  Recent syncopal episode EXAM: CT HEAD WITHOUT CONTRAST CT CERVICAL SPINE WITHOUT CONTRAST TECHNIQUE: Multidetector CT imaging of the head and cervical spine was performed following the standard protocol without intravenous contrast. Multiplanar CT image reconstructions of the cervical spine were also generated. RADIATION DOSE REDUCTION: This exam was performed according to the departmental dose-optimization program which includes automated exposure control, adjustment of the mA and/or kV according to patient size and/or use of iterative reconstruction technique. COMPARISON:  10/06/2021 FINDINGS: CT HEAD FINDINGS Brain: No evidence of acute infarction, hemorrhage, hydrocephalus, extra-axial collection or mass lesion/mass effect. Mild atrophic changes and chronic white matter ischemic changes noted. Vascular: No hyperdense vessel or unexpected calcification. Skull: Normal. Negative for fracture or focal lesion. Sinuses/Orbits: No acute finding. Other: None. CT CERVICAL SPINE FINDINGS Alignment: Within normal limits. Skull base and vertebrae: 7 cervical segments are well visualized. Vertebral body height is well maintained. Multilevel osteophytic changes and facet hypertrophic changes are seen. No acute fracture or acute facet abnormality is noted. Soft tissues and spinal canal: Surrounding soft tissue structures are within normal limits. Upper chest: Visualized lung apices are unremarkable. Other: None IMPRESSION: CT of the head: Chronic atrophic and ischemic changes without acute abnormality. CT of the cervical spine: Multilevel degenerative changes without acute abnormality. Electronically Signed   By: Inez Catalina M.D.   On: 10/24/2021 21:22   CT Angio Chest PE W and/or Wo Contrast  Result Date: 10/24/2021 CLINICAL DATA:  Recent syncopal episode EXAM: CT ANGIOGRAPHY CHEST WITH CONTRAST TECHNIQUE: Multidetector CT imaging of the chest was performed using the standard protocol during bolus administration  of intravenous contrast. Multiplanar CT image reconstructions and MIPs were obtained to evaluate the vascular anatomy. RADIATION DOSE REDUCTION: This exam was performed according to the departmental dose-optimization program which includes automated exposure control, adjustment of the mA and/or kV according to patient size and/or use of iterative reconstruction technique. CONTRAST:  137mL OMNIPAQUE IOHEXOL 350 MG/ML SOLN COMPARISON:  CT from 10/06/2021 FINDINGS: Cardiovascular: Thoracic aorta demonstrates atherosclerotic calcifications without aneurysmal dilatation or dissection. No cardiac enlargement is seen. Coronary calcifications are noted. The pulmonary artery shows bilateral filling defects consistent with pulmonary emboli primarily within the lower lobe branches. Some right upper lobe pulmonary emboli are seen as well. Mild right heart strain is noted within RV/LV ratio of 1. Some of the previously seen emboli have resolved although some new emboli are noted. Mediastinum/Nodes: Thoracic inlet is within normal limits. No sizable hilar or mediastinal adenopathy is noted. The esophagus as visualized is within normal limits. Lungs/Pleura: Lungs demonstrates some mild dependent atelectatic changes. No focal infiltrate or sizable effusion is seen. No parenchymal nodules are noted. Upper Abdomen: Visualized upper abdomen shows no acute abnormality. Musculoskeletal: Degenerative changes of the thoracic spine are noted. No rib abnormality is noted. Review of the MIP images confirms the above findings. IMPRESSION: Changes consistent with bilateral pulmonary emboli with mild right heart strain. The degree of pulmonary emboli has overall worsened slightly in the  interval from the prior exam. No other focal abnormality is noted. Aortic Atherosclerosis (ICD10-I70.0). Critical Value/emergent results were called by telephone at the time of interpretation on 10/24/2021 at 9:15 pm to Dr. Marjean Donna , who verbally acknowledged  these results. Electronically Signed   By: Inez Catalina M.D.   On: 10/24/2021 21:19   CT Cervical Spine Wo Contrast  Result Date: 10/24/2021 CLINICAL DATA:  Recent syncopal episode EXAM: CT HEAD WITHOUT CONTRAST CT CERVICAL SPINE WITHOUT CONTRAST TECHNIQUE: Multidetector CT imaging of the head and cervical spine was performed following the standard protocol without intravenous contrast. Multiplanar CT image reconstructions of the cervical spine were also generated. RADIATION DOSE REDUCTION: This exam was performed according to the departmental dose-optimization program which includes automated exposure control, adjustment of the mA and/or kV according to patient size and/or use of iterative reconstruction technique. COMPARISON:  10/06/2021 FINDINGS: CT HEAD FINDINGS Brain: No evidence of acute infarction, hemorrhage, hydrocephalus, extra-axial collection or mass lesion/mass effect. Mild atrophic changes and chronic white matter ischemic changes noted. Vascular: No hyperdense vessel or unexpected calcification. Skull: Normal. Negative for fracture or focal lesion. Sinuses/Orbits: No acute finding. Other: None. CT CERVICAL SPINE FINDINGS Alignment: Within normal limits. Skull base and vertebrae: 7 cervical segments are well visualized. Vertebral body height is well maintained. Multilevel osteophytic changes and facet hypertrophic changes are seen. No acute fracture or acute facet abnormality is noted. Soft tissues and spinal canal: Surrounding soft tissue structures are within normal limits. Upper chest: Visualized lung apices are unremarkable. Other: None IMPRESSION: CT of the head: Chronic atrophic and ischemic changes without acute abnormality. CT of the cervical spine: Multilevel degenerative changes without acute abnormality. Electronically Signed   By: Inez Catalina M.D.   On: 10/24/2021 21:22    Labs on Admission: I have personally reviewed following labs  CBC: Recent Labs  Lab 10/19/21 2016  10/24/21 2028  WBC 8.2 8.2  NEUTROABS  --  5.0  HGB 14.1 13.0  HCT 42.0 38.6*  MCV 100.7* 100.3*  PLT 171 [id number]*   Basic Metabolic Panel: Recent Labs  Lab 10/19/21 2016 10/24/21 2028 10/24/21 2221  NA 135 134*  --   K 3.5 3.4*  --   CL 99 99  --   CO2 28 27  --   GLUCOSE 99 123*  --   BUN 7 9  --   CREATININE 0.75 0.89  --   CALCIUM 9.7 9.4  --   MG  --   --  1.7  PHOS  --   --  4.0   GFR: Estimated Creatinine Clearance: 86.5 mL/min (by C-G formula based on SCr of 0.89 mg/dL).  Liver Function Tests: Recent Labs  Lab 10/24/21 2028  AST 46*  ALT 27  ALKPHOS 66  BILITOT 0.6  PROT 7.3  ALBUMIN 3.7   Urine analysis:    Component Value Date/Time   COLORURINE YELLOW 10/24/2021 2028   APPEARANCEUR CLEAR 10/24/2021 2028   APPEARANCEUR Clear 12/14/2012 1150   LABSPEC 1.010 10/24/2021 2028   LABSPEC 1.011 12/14/2012 1150   PHURINE 5.5 10/24/2021 2028   GLUCOSEU NEGATIVE 10/24/2021 2028   GLUCOSEU Negative 12/14/2012 1150   HGBUR NEGATIVE 10/24/2021 2028   BILIRUBINUR NEGATIVE 10/24/2021 2028   BILIRUBINUR Negative 12/14/2012 1150   KETONESUR NEGATIVE 10/24/2021 2028   PROTEINUR NEGATIVE 10/24/2021 2028   NITRITE NEGATIVE 10/24/2021 2028   LEUKOCYTESUR NEGATIVE 10/24/2021 2028   LEUKOCYTESUR Negative 12/14/2012 1150   Dr. Tobie Poet [facility]  If 7PM-7AM, please  contact overnight-coverage provider If 7AM-7PM, please contact day coverage provider [email or web address]  10/25/2021, 1:43 AM

## 2021-10-24 NOTE — ED Triage Notes (Signed)
[place]. Pt reports CP, SHOB, dizziness, and N/V. Denies any alcohol or drug use. Patient received 324mg  of ASA and one spray of NTG while in route to the ED.

## 2021-10-24 NOTE — ED Notes (Signed)
Patient to CT.

## 2021-10-24 NOTE — ED Provider Notes (Signed)
[facility] Provider Note    Event Date/Time   First MD Initiated Contact with Patient 10/24/21 1952     (approximate)   History   Loss of Consciousness and Chest Pain   HPI  Jose [name] is a 61 y.o. male  with HTN, polysubstance abuse, hepatitis C, pulmonary embolism who comes in with chest pain.  Patient has been here multiple times for chest pain.  However this time patient reports that he was walking and that he started to have dizziness and chest pain and he leaned up against a wall and then collapsed.  He reports some continued dizziness now.  He is does not think he hit his head.  Denies EtOH use today.  Denies abdominal injuries.   Physical Exam   Triage Vital Signs: ED Triage Vitals  Enc Vitals Group     BP 10/24/21 1948 (!) 146/100     Pulse Rate 10/24/21 1948 90     Resp 10/24/21 1948 (!) 22     Temp 10/24/21 1948 98.5 F (36.9 C)     Temp Source 10/24/21 1948 Oral     SpO2 10/24/21 1944 100 %     Weight 10/24/21 1953 152 lb (68.9 kg)     Height 10/24/21 1953 5\' 8"  (1.727 m)     Head Circumference --      Peak Flow --      Pain Score 10/24/21 1952 9     Pain Loc --      Pain Edu? --      Excl. in GC? --     Most recent vital signs: Vitals:   10/24/21 1948 10/24/21 1951  BP: (!) 146/100   Pulse: 90   Resp: (!) 22   Temp: 98.5 F (36.9 C)   SpO2: 98% 100%     General: Awake, no distress.  CV:  Good peripheral perfusion.  Tender with pushing on his chest Resp:  Normal effort.   Abd:  No distention. Nontender Other:  Weak grip strength bilaterally.  Able to lift both legs up off the bed   ED Results / Procedures / Treatments   Labs (all labs ordered are listed, but only abnormal results are displayed) Labs Reviewed  CBC WITH DIFFERENTIAL/PLATELET - Abnormal; Notable for the following components:      Result Value   RBC 3.85 (*)    HCT 38.6 (*)    MCV 100.3 (*)    Platelets 145 (*)    All other components  within normal limits  COMPREHENSIVE METABOLIC PANEL - Abnormal; Notable for the following components:   Sodium 134 (*)    Potassium 3.4 (*)    Glucose, Bld 123 (*)    AST 46 (*)    All other components within normal limits  URINALYSIS, ROUTINE W REFLEX MICROSCOPIC  TROPONIN I (HIGH SENSITIVITY)     EKG  My interpretation of EKG: Normal sinus rhythm 90 without any ST elevation or T wave inversions in lead V3, right bundle branch block   RADIOLOGY I have reviewed the CT, CT PE, CT cervical personally and pending the reads but does appear that he is got bilateral PEs  PROCEDURES:  Critical Care performed: Yes, see critical care procedure note(s)  .Critical Care Performed by: 10/26/21, MD Authorized by: Concha Se, MD   Critical care provider statement:    Critical care time (minutes):  30   Critical care was necessary to treat or prevent imminent  or life-threatening deterioration of the following conditions: PE on heparin.   Critical care was time spent personally by me on the following activities:  Development of treatment plan with patient or surrogate, discussions with consultants, evaluation of patient's response to treatment, examination of patient, ordering and review of laboratory studies, ordering and review of radiographic studies, ordering and performing treatments and interventions, pulse oximetry, re-evaluation of patient's condition and review of old charts .1-3 Lead EKG Interpretation Performed by: Concha Se, MD Authorized by: Concha Se, MD     Interpretation: normal     ECG rate:  90   ECG rate assessment: normal     Rhythm: sinus rhythm     Ectopy: none     Conduction: normal     MEDICATIONS ORDERED IN ED: Medications  sodium chloride 0.9 % bolus 500 mL (500 mLs Intravenous New Bag/Given 10/24/21 2033)  oxyCODONE (Oxy IR/ROXICODONE) immediate release tablet 5 mg (5 mg Oral Given 10/24/21 2028)  iohexol (OMNIPAQUE) 350 MG/ML injection 100 mL  (100 mLs Intravenous Contrast Given 10/24/21 2054)     IMPRESSION / MDM / ASSESSMENT AND PLAN / ED COURSE  I reviewed the triage vital signs and the nursing notes.                              Differential diagnosis includes, but is not limited to, I am concerned about worsening PE given he has been noncompliant and he could have some right heart strain causing syncope.  We will also get EKG, cardiac marker to evaluate for arrhythmia and labs evaluate for any anemia.  Given he is not the best historian also get CT head to make sure no intracranial hemorrhage  UA no UTI CBC stable hemoglobin CMP stable kidney function Troponin negative  CT PE concern for worsening clot burden as well as development of right heart strain.  Therefore we will discussed with the hospital team and placed on heparin and will admit.   CT head and neck were negative  The patient is on the cardiac monitor to evaluate for evidence of arrhythmia and/or significant heart rate changes.     FINAL CLINICAL IMPRESSION(S) / ED DIAGNOSES   Final diagnoses:  Syncope and collapse  Acute pulmonary embolism, unspecified pulmonary embolism type, unspecified whether acute cor pulmonale present (HCC)     Rx / DC Orders   ED Discharge Orders     None        Note:  This document was prepared using Dragon voice recognition software and may include unintentional dictation errors.   Concha Se, MD 10/24/21 2141

## 2021-10-25 DIAGNOSIS — Z6822 Body mass index (BMI) 22.0-22.9, adult: Secondary | ICD-10-CM | POA: Diagnosis not present

## 2021-10-25 DIAGNOSIS — I10 Essential (primary) hypertension: Secondary | ICD-10-CM | POA: Diagnosis present

## 2021-10-25 DIAGNOSIS — Z5900 Homelessness unspecified: Secondary | ICD-10-CM | POA: Diagnosis not present

## 2021-10-25 DIAGNOSIS — Z79899 Other long term (current) drug therapy: Secondary | ICD-10-CM | POA: Diagnosis not present

## 2021-10-25 DIAGNOSIS — B182 Chronic viral hepatitis C: Secondary | ICD-10-CM | POA: Diagnosis present

## 2021-10-25 DIAGNOSIS — Z20822 Contact with and (suspected) exposure to covid-19: Secondary | ICD-10-CM | POA: Diagnosis present

## 2021-10-25 DIAGNOSIS — Z7901 Long term (current) use of anticoagulants: Secondary | ICD-10-CM | POA: Diagnosis not present

## 2021-10-25 DIAGNOSIS — I2699 Other pulmonary embolism without acute cor pulmonale: Secondary | ICD-10-CM | POA: Diagnosis present

## 2021-10-25 DIAGNOSIS — I951 Orthostatic hypotension: Secondary | ICD-10-CM | POA: Diagnosis present

## 2021-10-25 DIAGNOSIS — F17210 Nicotine dependence, cigarettes, uncomplicated: Secondary | ICD-10-CM | POA: Diagnosis present

## 2021-10-25 DIAGNOSIS — R55 Syncope and collapse: Secondary | ICD-10-CM | POA: Diagnosis present

## 2021-10-25 DIAGNOSIS — R079 Chest pain, unspecified: Secondary | ICD-10-CM | POA: Diagnosis present

## 2021-10-25 DIAGNOSIS — F1020 Alcohol dependence, uncomplicated: Secondary | ICD-10-CM | POA: Diagnosis present

## 2021-10-25 DIAGNOSIS — Z5902 Unsheltered homelessness: Secondary | ICD-10-CM | POA: Diagnosis not present

## 2021-10-25 DIAGNOSIS — Z23 Encounter for immunization: Secondary | ICD-10-CM | POA: Diagnosis not present

## 2021-10-25 DIAGNOSIS — E43 Unspecified severe protein-calorie malnutrition: Secondary | ICD-10-CM | POA: Diagnosis present

## 2021-10-25 DIAGNOSIS — Z91199 Patient's noncompliance with other medical treatment and regimen due to unspecified reason: Secondary | ICD-10-CM | POA: Diagnosis not present

## 2021-10-25 DIAGNOSIS — Z9114 Patient's other noncompliance with medication regimen: Secondary | ICD-10-CM | POA: Diagnosis not present

## 2021-10-25 DIAGNOSIS — F10129 Alcohol abuse with intoxication, unspecified: Secondary | ICD-10-CM | POA: Diagnosis not present

## 2021-10-25 LAB — CBC
HCT: 41.2 % (ref 39.0–52.0)
Hemoglobin: 13.9 g/dL (ref 13.0–17.0)
MCH: 34.1 pg — ABNORMAL HIGH (ref 26.0–34.0)
MCHC: 33.7 g/dL (ref 30.0–36.0)
MCV: 101 fL — ABNORMAL HIGH (ref 80.0–100.0)
Platelets: 150 10*3/uL (ref 150–400)
RBC: 4.08 MIL/uL — ABNORMAL LOW (ref 4.22–5.81)
RDW: 11.9 % (ref 11.5–15.5)
WBC: 6.8 10*3/uL (ref 4.0–10.5)
nRBC: 0 % (ref 0.0–0.2)

## 2021-10-25 LAB — BASIC METABOLIC PANEL
Anion gap: 5 (ref 5–15)
BUN: 8 mg/dL (ref 6–20)
CO2: 28 mmol/L (ref 22–32)
Calcium: 9.9 mg/dL (ref 8.9–10.3)
Chloride: 104 mmol/L (ref 98–111)
Creatinine, Ser: 0.9 mg/dL (ref 0.61–1.24)
GFR, Estimated: >60 mL/min (ref >60)
Glucose, Bld: 107 mg/dL — ABNORMAL HIGH (ref 70–99)
Potassium: 4.7 mmol/L (ref 3.5–5.1)
Sodium: 137 mmol/L (ref 135–145)

## 2021-10-25 LAB — HEPARIN LEVEL (UNFRACTIONATED): Heparin Unfractionated: 0.52 IU/mL (ref 0.30–0.70)

## 2021-10-25 LAB — APTT: aPTT: 76 seconds — ABNORMAL HIGH (ref 24–36)

## 2021-10-25 MED ORDER — INFLUENZA VAC SPLIT QUAD 0.5 ML IM SUSY
0.5000 mL | PREFILLED_SYRINGE | INTRAMUSCULAR | Status: AC
  Administered 2021-10-26: 0.5 mL via INTRAMUSCULAR
  Filled 2021-10-25: qty 0.5

## 2021-10-25 MED ORDER — APIXABAN 5 MG PO TABS
10.0000 mg | ORAL_TABLET | Freq: Two times a day (BID) | ORAL | Status: DC
  Administered 2021-10-25 – 2021-10-27 (×5): 10 mg via ORAL
  Filled 2021-10-25 (×5): qty 2

## 2021-10-25 MED ORDER — SODIUM CHLORIDE 0.9 % IV SOLN: INTRAVENOUS | Status: DC

## 2021-10-25 MED ORDER — APIXABAN 5 MG PO TABS: 5.0000 mg | ORAL_TABLET | Freq: Two times a day (BID) | ORAL | Status: DC

## 2021-10-25 MED ORDER — AMLODIPINE BESYLATE 5 MG PO TABS
5.0000 mg | ORAL_TABLET | Freq: Every day | ORAL | Status: DC
  Administered 2021-10-25: 5 mg via ORAL
  Filled 2021-10-25: qty 1

## 2021-10-25 MED ORDER — OXYCODONE-ACETAMINOPHEN 5-325 MG PO TABS
1.0000 | ORAL_TABLET | Freq: Four times a day (QID) | ORAL | Status: DC | PRN
  Administered 2021-10-25 – 2021-10-27 (×6): 1 via ORAL
  Filled 2021-10-25 (×6): qty 1

## 2021-10-25 NOTE — ED Notes (Signed)
Lab at the bedside 

## 2021-10-25 NOTE — Assessment & Plan Note (Signed)
-   Resumed home amlodipine 5 mg daily 

## 2021-10-25 NOTE — Assessment & Plan Note (Signed)
TOC consulted. 

## 2021-10-25 NOTE — Progress Notes (Signed)
PROGRESS NOTE  Jose [name]  DOB: 09/26/1960  PCP: Aviva Kluver NFA:[id number]  DOA: 10/24/2021  LOS: 0 days  Hospital Day: 2  Chief Complaint  Patient presents with   Loss of Consciousness   Chest Pain    Brief narrative: Jose [name] is a 61 y.o. male with PMH significant for homelessness, pulm embolism, hypertension, polysubstance abuse, hepatitis C, medication noncompliance. Patient presented to the ED on 1/31 after syncopal event. Patient was recently hospitalized from 1/13-1/14 for acute pulmonary embolism and was started on Eliquis.  It seems that patient's monthly cost of Eliquis was found to be $4.  Patient reports he was not able to afford it and has not been taking Eliquis.  In the ED, patient was hemodynamically stable. CT head showed chronic atrophic and ischemic changes without abnormality CTA of the chest showed bilateral pulmonary emboli with mild right heart strain. The degree of pulmonary emboli has overall worsened slightly in the interval from the prior exam from 2 weeks ago. Kept in observation on heparin drip.  Subjective: Patient was seen and examined this morning.  Pleasant middle-aged Caucasian male.  Propped up in bed.  Not in distress.  Not on supplemental oxygen.  States that when he got up earlier to go to the bathroom, he felt like he was going to pass out.  Assessment/Plan: Acute pulmonary embolism -First diagnosed on 10/06/2021 and was started on Eliquis which patient apparently is not taking.  He cites inability to afford as a problem.  Per last discharge note, it cost him $4 per month. -Currently on heparin drip.  We will switch to Eliquis this morning.  Syncope -Presented with syncopal event.  It has been presumed to be due to pulm embolism. -Hemodynamically stable.  Patient states that earlier this morning, when he stood up to walk to the bathroom, he felt dizzy as if he was going to pass out again. -Suspect orthostatic hypotension.   Start on IV hydration -Echocardiogram from 10/06/2021 with EF 60 to 65%, no regional WMA, mildly enlarged right ventricular size but normal systolic function. -Prior to admission he was on amlodipine.  I would keep it on hold for now.  Homelessness Medication noncompliance -Social work consulted  Mobility: Encourage ambulation Living condition: Homeless Goals of care:   Code Status: Full Code  Nutritional status: Body mass index is 23.11 kg/m.      Diet:  Diet Order             Diet Heart Room service appropriate? Yes; Fluid consistency: Thin  Diet effective now                  DVT prophylaxis:  Place TED hose Start: 10/24/21 2142   Antimicrobials: None Fluid: NS at 75 Consultants: None Family Communication: None at bedside  Status is: Inpatient  Continue in-hospital care because: Syncopal work-up, needs IV fluid Level of care: Progressive   Dispo: The patient is from: Homeless              Anticipated d/c is to: Maybe shelter              Patient currently is not medically stable to d/c.   Difficult to place patient No     Infusions:   sodium chloride     heparin 1,100 Units/hr (10/25/21 0636)    Scheduled Meds:  folic acid  1 mg Oral Daily   multivitamin with minerals  1 tablet Oral Daily   thiamine  100  mg Oral Daily   Or   thiamine  100 mg Intravenous Daily    PRN meds: acetaminophen **OR** acetaminophen, LORazepam **OR** LORazepam, morphine injection, ondansetron **OR** ondansetron (ZOFRAN) IV   Antimicrobials: Anti-infectives (From admission, onward)    None       Objective: Vitals:   10/25/21 0700 10/25/21 0700  BP: (!) 141/95 (!) 141/95  Pulse: 76 74  Resp: 18   Temp:    SpO2: 93%     Intake/Output Summary (Last 24 hours) at 10/25/2021 0953 Last data filed at 10/24/2021 2105 Gross per 24 hour  Intake 532.8 ml  Output --  Net 532.8 ml   Filed Weights   10/24/21 1953  Weight: 68.9 kg   Weight change:  Body mass index is  23.11 kg/m.   Physical Exam: General exam: Pleasant middle-aged Caucasian male.  Looks older for his stated age Skin: No rashes, lesions or ulcers. HEENT: Atraumatic, normocephalic, no obvious bleeding Lungs: Clear to auscultation bilaterally CVS: Regular rate and rhythm, no murmur GI/Abd soft, nontender, nondistended, bowel sound present CNS: Alert, awake, oriented x3 Psychiatry: Sad affect Extremities: No pedal edema, no calf tenderness  Data Review: I have personally reviewed the laboratory data and studies available.  F/u labs ordered Unresulted Labs (From admission, onward)     Start     Ordered   10/26/21 0500  CBC  Tomorrow morning,   STAT        10/25/21 0858   10/25/21 1300  Heparin level (unfractionated)  Once-Timed,   STAT        10/25/21 0856            Signed, Lorin Glass, MD [facility] 10/25/2021

## 2021-10-25 NOTE — Assessment & Plan Note (Addendum)
-   Worsening PE burden, patient has not take his as Eliquis for 3 to 4 days - He states that he cannot afford his medication - Patient is homeless - [facility] consulted for medication assistance - Heparin GGT given that there is right strain burden

## 2021-10-25 NOTE — Consult Note (Signed)
ANTICOAGULATION CONSULT NOTE - Consult  Pharmacy Consult for Heparin Indication: pulmonary embolus (worsening of prior)  No Known Allergies  Patient Measurements: Height: 5\' 8"  (172.7 cm) Weight: 68.9 kg (152 lb) IBW/kg (Calculated) : 68.4 Heparin Dosing Weight: 65.8kg (based on recent admit)  Vital Signs: BP: 141/95 (02/01 0700) Pulse Rate: 74 (02/01 0700)  Labs: Recent Labs    10/24/21 2028 10/24/21 2221 10/25/21 0638  HGB 13.0  --  13.9  HCT 38.6*  --  41.2  PLT 145*  --  150  APTT  --  27 76*  LABPROT  --  14.1  --   INR  --  1.1  --   HEPARINUNFRC  --  <0.10* 0.52  CREATININE 0.89  --  0.90  TROPONINIHS 6 7  --      Estimated Creatinine Clearance: 85.5 mL/min (by C-G formula based on SCr of 0.9 mg/dL).   Medications:  PTA: Eliquis (per notes pt reports non-compliance and presents with worsening PE)  Assessment: 61 yo male w/ h/o HTN, polysubstance abuse, hepatitis C, and recently admitted with diagnosis of multiple acute segmental to subsegmental pulmonary emboli discharged on Eliquis. Pt self-reported non-compliance with Eliquis, leading to current presentation and worsening of PE on CT imaging. Pharmacy consulted for mgmt of hep gtt.  Date Time aPTT/HL Rate/Comment 2/1 0638 76 / 0.52 1100 un/hr, therapeutic    Baseline Labs: aPTT - ordered INR - ordered Hgb - 13 Plts - 145  Goal of Therapy:  Heparin level 0.3-0.7 units/ml aPTT 66-102 seconds Monitor platelets by anticoagulation protocol: Yes   Plan:  Heparin level an aPTT therapeutic and correlating, will monitor and adjust rate based on heparin level moving forward.  Continue heparin infusion at 1100 units/hr Check confirmatory HL in 6 hours Monitor daily CBC, s/s of bleed    Darnelle Bos, PharmD Clinical Pharmacist 10/24/2021 9:53 PM

## 2021-10-26 DIAGNOSIS — E43 Unspecified severe protein-calorie malnutrition: Secondary | ICD-10-CM | POA: Insufficient documentation

## 2021-10-26 LAB — CBC
HCT: 37.9 % — ABNORMAL LOW (ref 39.0–52.0)
Hemoglobin: 12.5 g/dL — ABNORMAL LOW (ref 13.0–17.0)
MCH: 33.2 pg (ref 26.0–34.0)
MCHC: 33 g/dL (ref 30.0–36.0)
MCV: 100.5 fL — ABNORMAL HIGH (ref 80.0–100.0)
Platelets: 133 10*3/uL — ABNORMAL LOW (ref 150–400)
RBC: 3.77 MIL/uL — ABNORMAL LOW (ref 4.22–5.81)
RDW: 11.9 % (ref 11.5–15.5)
WBC: 5.2 10*3/uL (ref 4.0–10.5)
nRBC: 0 % (ref 0.0–0.2)

## 2021-10-26 MED ORDER — ENSURE ENLIVE PO LIQD
237.0000 mL | Freq: Three times a day (TID) | ORAL | Status: DC
  Administered 2021-10-26 (×2): 237 mL via ORAL

## 2021-10-26 NOTE — Progress Notes (Signed)
Initial Nutrition Assessment  DOCUMENTATION CODES:  Severe malnutrition in context of social or environmental circumstances  INTERVENTION:  Recommend liberalizing diet to regular.  Recommend obtaining lab results for folate, thiamine, Vitamin B12, Vitamin C, Vitamin D, and Vitamin A.  Add Ensure Plus High Protein po TID, each supplement provides 350 kcal and 20 grams of protein.   Add MVI with minerals daily.  Obtain updated weight.  Encourage PO and supplement intake.  NUTRITION DIAGNOSIS:  Severe Malnutrition related to social / environmental circumstances (homelessness) as evidenced by energy intake < 75% for > or equal to 3 months, severe muscle depletion.  GOAL:  Patient will meet greater than or equal to 90% of their needs  MONITOR:  PO intake, Supplement acceptance, Diet advancement, Labs, Weight trends, I & O's  REASON FOR ASSESSMENT:  Malnutrition Screening Tool    ASSESSMENT:  61 yo male with a PMH of  homelessness, pulmonary embolism, HTN, polysubstance abuse, and hepatitis C. Patient presented to the ED on 1/31 after syncopal event. Admitted with pulmonary embolism.  Spoke with pt at bedside. Pt reports that he may eat one meal a day given that he is homeless and can eat whatever he can get his hands on, which is usually not much and infrequent.  He reports that his clothes have been fitting looser lately, but he does not know how much he may weight or have lost.  Per Epic, pt's weight appears stated. RD to order measured weight to determine pt's weight status.  During nutrition exam, pt alerted RD that he has been having a lot of weakness and reported night blindness (a symptom of vitamin A deficiency). RD requested with MD to run folate, thiamine, Vitamin B12, A, C, and D labs. MD to run Vitamin B12 and Vitamin D labs.  Given that pt is severely malnourished, RD recommends that pt's diet is liberalized to regular. MD in agreement.  Medications: reviewed;  folic acid, MVI with minerals, thiamine, NaCl @ 75 ml/hr, oxycodone PO PRN (given twice today)  Labs: reviewed; Glucose 107 (H)  NUTRITION - FOCUSED PHYSICAL EXAM: Flowsheet Row Most Recent Value  Orbital Region Moderate depletion  Upper Arm Region Moderate depletion  Thoracic and Lumbar Region Moderate depletion  Buccal Region Severe depletion  Temple Region Moderate depletion  Clavicle Bone Region Severe depletion  Clavicle and Acromion Bone Region Severe depletion  Scapular Bone Region Unable to assess  Dorsal Hand Severe depletion  Patellar Region Moderate depletion  Anterior Thigh Region Moderate depletion  Posterior Calf Region Moderate depletion  Edema (RD Assessment) None  Hair Reviewed  Eyes Reviewed  [c/o of inability to see at night]  Mouth Reviewed  Skin Reviewed  Nails Reviewed   Diet Order:   Diet Order             Diet regular Room service appropriate? Yes with Assist; Fluid consistency: Thin  Diet effective now                  EDUCATION NEEDS:  Education needs have been addressed  Skin:  Skin Assessment: Reviewed RN Assessment  Last BM:  no BM documented  Height:  Ht Readings from Last 1 Encounters:  10/24/21 5\' 8"  (1.727 m)   Weight:  Wt Readings from Last 1 Encounters:  10/24/21 68.9 kg   BMI:  Body mass index is 23.11 kg/m.  Estimated Nutritional Needs:  Kcal:  2100-2300 Protein:  100-115 grams Fluid:  >2 L  Derrel Nip, RD, LDN (she/her/hers)  Clinical Inpatient Dietitian RD Pager/After-Hours/Weekend Pager # in [place]

## 2021-10-26 NOTE — Plan of Care (Signed)
Patient slept well, no alcohol withdrawal signs and symptoms reported, no BM tonight, VSS, patient remained safe.  Problem: Education: Goal: Knowledge of General Education information will improve Description: Including pain rating scale, medication(s)/side effects and non-pharmacologic comfort measures Outcome: Progressing   Problem: Health Behavior/Discharge Planning: Goal: Ability to manage health-related needs will improve Outcome: Progressing   Problem: Clinical Measurements: Goal: Ability to maintain clinical measurements within normal limits will improve Outcome: Progressing Goal: Will remain free from infection Outcome: Progressing Goal: Diagnostic test results will improve Outcome: Progressing Goal: Respiratory complications will improve Outcome: Progressing Goal: Cardiovascular complication will be avoided Outcome: Progressing

## 2021-10-26 NOTE — Progress Notes (Signed)
PROGRESS NOTE  Jose [name]  DOB: 1960-11-27  PCP: Kathyrn Lass [id number]  DOA: 10/24/2021  LOS: 1 day  Hospital Day: 3  Chief Complaint  Patient presents with   Loss of Consciousness   Chest Pain    Brief narrative: Jose [name] is a 61 y.o. male with PMH significant for homelessness, pulm embolism, hypertension, polysubstance abuse, hepatitis C, medication noncompliance. Patient presented to the ED on 1/31 after syncopal event. Patient was recently hospitalized from 1/13-1/14 for acute pulmonary embolism and was started on Eliquis.  It seems that patient's monthly cost of Eliquis was found to be $4.  Patient reports he was not able to afford it and has not been taking Eliquis.  In the ED, patient was hemodynamically stable. CT head showed chronic atrophic and ischemic changes without abnormality CTA of the chest showed bilateral pulmonary emboli with mild right heart strain. The degree of pulmonary emboli has overall worsened slightly in the interval from the prior exam from 2 weeks ago. Kept in observation on heparin drip.  Subjective: Patient was seen and examined this morning.  Lying down in bed.  Sad affect.  States he still had significant chest pain on deep breathing.  Per RN, patient had orthostatic blood pressure drop this morning.  Assessment/Plan: Acute pulmonary embolism -First diagnosed on 10/06/2021 and was started on Eliquis which patient apparently is not taking.  He cites inability to afford as a problem.  Per last discharge note, it cost him $4 per month. -Currently on heparin drip.  We will switch to Eliquis this morning. -Percocet for pain.  Syncope -Presented with syncopal event.  It has been presumed to be due to pulm embolism. -Patient reported dizziness on standing up.  Per RN, patient had orthostatic blood pressure drop this morning.  Currently on IV fluid. -Echocardiogram from 10/06/2021 with EF 60 to 65%, no regional WMA, mildly  enlarged right ventricular size but normal systolic function. -Prior to admission he was on amlodipine.  I would keep it on hold for now.  Homelessness Medication noncompliance -Social work consulted  Mobility: Encourage ambulation Living condition: Homeless Goals of care:   Code Status: Full Code  Nutritional status: Body mass index is 23.11 kg/m.  Nutrition Problem: Severe Malnutrition Etiology: social / environmental circumstances (homelessness) Signs/Symptoms: energy intake < 75% for > or equal to 3 months, severe muscle depletion Diet:  Diet Order             Diet regular Room service appropriate? Yes with Assist; Fluid consistency: Thin  Diet effective now                  DVT prophylaxis:  Place TED hose Start: 10/24/21 2142 apixaban (ELIQUIS) tablet 10 mg  apixaban (ELIQUIS) tablet 5 mg   Antimicrobials: None Fluid: NS at 25 Consultants: None Family Communication: None at bedside  Status is: Inpatient  Continue in-hospital care because: Syncopal work-up, needs IV fluid, continues to have pain Level of care: Progressive   Dispo: The patient is from: Homeless              Anticipated d/c is to: Maybe shelter              Patient currently is not medically stable to d/c.   Difficult to place patient No     Infusions:   sodium chloride 75 mL/hr at 10/26/21 1100    Scheduled Meds:  apixaban  10 mg Oral BID   Followed by   [  START ON 11/01/2021] apixaban  5 mg Oral BID   feeding supplement  237 mL Oral TID BM   folic acid  1 mg Oral Daily   multivitamin with minerals  1 tablet Oral Daily   thiamine  100 mg Oral Daily   Or   thiamine  100 mg Intravenous Daily    PRN meds: acetaminophen **OR** acetaminophen, LORazepam **OR** LORazepam, ondansetron **OR** ondansetron (ZOFRAN) IV, oxyCODONE-acetaminophen   Antimicrobials: Anti-infectives (From admission, onward)    None       Objective: Vitals:   10/26/21 0725 10/26/21 1133  BP: (!) 147/103  (!) 133/93  Pulse: 72 63  Resp: 18 18  Temp: 98.5 F (36.9 C) 98.5 F (36.9 C)  SpO2: 98% 100%    Intake/Output Summary (Last 24 hours) at 10/26/2021 1531 Last data filed at 10/26/2021 1423 Gross per 24 hour  Intake 2059.8 ml  Output --  Net 2059.8 ml    Filed Weights   10/24/21 1953  Weight: 68.9 kg   Weight change:  Body mass index is 23.11 kg/m.   Physical Exam: General exam: Pleasant middle-aged Caucasian male.  Looks older for his stated age Skin: No rashes, lesions or ulcers. HEENT: Atraumatic, normocephalic, no obvious bleeding Lungs: Clear to auscultation bilaterally.  Limited lung excursion because of pain CVS: Regular rate and rhythm, no murmur GI/Abd soft, nontender, nondistended, bowel sound present CNS: Alert, awake, oriented x3 Psychiatry: Sad affect Extremities: No pedal edema, no calf tenderness  Data Review: I have personally reviewed the laboratory data and studies available.  F/u labs ordered Unresulted Labs (From admission, onward)     Start     Ordered   10/27/21 0500  Vitamin B12  Tomorrow morning,   R        10/26/21 1057   10/27/21 0500  VITAMIN D 25 Hydroxy (Vit-D Deficiency, Fractures)  Tomorrow morning,   R        10/26/21 1057            Signed, Terrilee Croak, MD [facility] 10/26/2021

## 2021-10-27 ENCOUNTER — Other Ambulatory Visit: Payer: Self-pay

## 2021-10-27 DIAGNOSIS — F10129 Alcohol abuse with intoxication, unspecified: Secondary | ICD-10-CM | POA: Insufficient documentation

## 2021-10-27 DIAGNOSIS — I10 Essential (primary) hypertension: Secondary | ICD-10-CM | POA: Insufficient documentation

## 2021-10-27 DIAGNOSIS — R079 Chest pain, unspecified: Secondary | ICD-10-CM | POA: Diagnosis not present

## 2021-10-27 DIAGNOSIS — I2699 Other pulmonary embolism without acute cor pulmonale: Secondary | ICD-10-CM | POA: Diagnosis not present

## 2021-10-27 DIAGNOSIS — Z5900 Homelessness unspecified: Secondary | ICD-10-CM | POA: Insufficient documentation

## 2021-10-27 LAB — CBC WITH DIFFERENTIAL/PLATELET
Abs Immature Granulocytes: 0.04 10*3/uL (ref 0.00–0.07)
Abs Immature Granulocytes: 0.05 10*3/uL (ref 0.00–0.07)
Basophils Absolute: 0 10*3/uL (ref 0.0–0.1)
Basophils Absolute: 0 10*3/uL (ref 0.0–0.1)
Basophils Relative: 0 %
Basophils Relative: 0 %
Eosinophils Absolute: 0.1 10*3/uL (ref 0.0–0.5)
Eosinophils Absolute: 0.1 10*3/uL (ref 0.0–0.5)
Eosinophils Relative: 1 %
Eosinophils Relative: 2 %
HCT: 37.8 % — ABNORMAL LOW (ref 39.0–52.0)
HCT: 42.8 % (ref 39.0–52.0)
Hemoglobin: 12.7 g/dL — ABNORMAL LOW (ref 13.0–17.0)
Hemoglobin: 14.4 g/dL (ref 13.0–17.0)
Immature Granulocytes: 1 %
Immature Granulocytes: 1 %
Lymphocytes Relative: 22 %
Lymphocytes Relative: 25 %
Lymphs Abs: 1.4 10*3/uL (ref 0.7–4.0)
Lymphs Abs: 1.6 10*3/uL (ref 0.7–4.0)
MCH: 33.2 pg (ref 26.0–34.0)
MCH: 33.8 pg (ref 26.0–34.0)
MCHC: 33.6 g/dL (ref 30.0–36.0)
MCHC: 33.6 g/dL (ref 30.0–36.0)
MCV: 100.5 fL — ABNORMAL HIGH (ref 80.0–100.0)
MCV: 98.7 fL (ref 80.0–100.0)
Monocytes Absolute: 0.6 10*3/uL (ref 0.1–1.0)
Monocytes Absolute: 0.7 10*3/uL (ref 0.1–1.0)
Monocytes Relative: 11 %
Monocytes Relative: 9 %
Neutro Abs: 3.4 10*3/uL (ref 1.7–7.7)
Neutro Abs: 5 10*3/uL (ref 1.7–7.7)
Neutrophils Relative %: 61 %
Neutrophils Relative %: 67 %
Platelets: 144 10*3/uL — ABNORMAL LOW (ref 150–400)
Platelets: 159 10*3/uL (ref 150–400)
RBC: 3.83 MIL/uL — ABNORMAL LOW (ref 4.22–5.81)
RBC: 4.26 MIL/uL (ref 4.22–5.81)
RDW: 11.9 % (ref 11.5–15.5)
RDW: 11.9 % (ref 11.5–15.5)
WBC: 5.6 10*3/uL (ref 4.0–10.5)
WBC: 7.4 10*3/uL (ref 4.0–10.5)
nRBC: 0 % (ref 0.0–0.2)
nRBC: 0 % (ref 0.0–0.2)

## 2021-10-27 LAB — VITAMIN D 25 HYDROXY (VIT D DEFICIENCY, FRACTURES): Vit D, 25-Hydroxy: 19.57 ng/mL — ABNORMAL LOW (ref 30–100)

## 2021-10-27 LAB — BASIC METABOLIC PANEL
Anion gap: 6 (ref 5–15)
BUN: 12 mg/dL (ref 6–20)
CO2: 26 mmol/L (ref 22–32)
Calcium: 9.4 mg/dL (ref 8.9–10.3)
Chloride: 105 mmol/L (ref 98–111)
Creatinine, Ser: 0.72 mg/dL (ref 0.61–1.24)
GFR, Estimated: >60 mL/min (ref >60)
Glucose, Bld: 98 mg/dL (ref 70–99)
Potassium: 4.3 mmol/L (ref 3.5–5.1)
Sodium: 137 mmol/L (ref 135–145)

## 2021-10-27 LAB — VITAMIN B12: Vitamin B-12: 193 pg/mL (ref 180–914)

## 2021-10-27 MED ORDER — APIXABAN (ELIQUIS) VTE STARTER PACK (10MG AND 5MG): ORAL_TABLET | ORAL | 0 refills | Status: DC

## 2021-10-27 NOTE — TOC Transition Note (Signed)
Transition of Care [facility]) - CM/SW Discharge Note   Patient Details  Name: Jose [name] MRN: [id number] Date of Birth: [birth year]  Transition of Care [facility]) CM/SW Contact:  Gildardo Griffes, LCSW Phone Number: 10/27/2021, 12:59 PM   Clinical Narrative:     Patient to discharge today, reports to get his meds sent to [facility] pharmacy off [facility], reports he can find the money to pay for his meds and that his friend will pick him up at discharge today.   No further needs identified at this time.   Final next level of care: Home/Self Care Barriers to Discharge: No Barriers Identified   Patient Goals and CMS Choice Patient states their goals for this hospitalization and ongoing recovery are:: to go home CMS Medicare.gov Compare Post Acute Care list provided to:: Patient Choice offered to / list presented to : Patient  Discharge Placement                    Patient and family notified of of transfer: 10/27/21  Discharge Plan and Services                                     Social Determinants of Health (SDOH) Interventions     Readmission Risk Interventions No flowsheet data found.

## 2021-10-27 NOTE — Discharge Summary (Signed)
Physician Discharge Summary  Jose [name] ZOX:[id number] DOB: July 05, 1961 DOA: 10/24/2021  PCP: Pcp, No  Admit date: 10/24/2021 Discharge date: 10/27/2021  Admitted From: Homeless Discharge disposition: Shelter offered  Recommendations at discharge:  Please do not miss a dose of your blood thinner  Discharge Diagnosis:   Principal Problem:   Pulmonary embolism (HCC) Active Problems:   Hepatitis C   Alcohol use disorder, severe, dependence (HCC)   Stimulant use disorder (HCC) (cocaine)   Tobacco use disorder   Substance induced mood disorder (HCC)   Homeless   Patient's noncompliance with other medical treatment and regimen due to financial hardship   Essential hypertension   Syncope and collapse   Protein-calorie malnutrition, severe    Chief Complaint  Patient presents with   Loss of Consciousness   Chest Pain    Brief narrative: Jose [name] is a 61 y.o. male with PMH significant for homelessness, pulm embolism, hypertension, polysubstance abuse, hepatitis C, medication noncompliance. Patient presented to the ED on 1/31 after syncopal event. Patient was recently hospitalized from 1/13-1/14 for acute pulmonary embolism and was started on Eliquis.  It seems that patient's monthly cost of Eliquis was found to be $4.  Patient reports he was not able to afford it and has not been taking Eliquis.  In the ED, patient was hemodynamically stable. CT head showed chronic atrophic and ischemic changes without abnormality CTA of the chest showed bilateral pulmonary emboli with mild right heart strain. The degree of pulmonary emboli has overall worsened slightly in the interval from the prior exam from 2 weeks ago. Kept in observation on heparin drip.  Subjective: Patient was seen and examined this morning.  Lying down in bed.  Sad affect.  States he still had significant chest pain on deep breathing.  Per RN, patient had orthostatic blood pressure drop this  morning.  Hospital course: Acute pulmonary embolism -First diagnosed on 10/06/2021 and was started on Eliquis which patient apparently is not taking.  He cites inability to afford as a problem.  Per last discharge note, it costs him $4 per month. -He was initiated on heparin drip and later switched to Eliquis. -Complain of pain on deep breathing.  Improving.  Syncope -Presented with syncopal event.  It has been presumed to be due to pulm embolism. -Patient reported dizziness on standing up.  Patient probably had orthostatic hypotension.  Adequately hydrated.   -Echocardiogram from 10/06/2021 with EF 60 to 65%, no regional WMA, mildly enlarged right ventricular size but normal systolic function. -Prior to admission he was on amlodipine.  Continue the same.  Homelessness Medication noncompliance -Social work consulted  Mobility: Encourage ambulation Living condition: Homeless Goals of care:   Code Status: Full Code  Nutritional status: Body mass index is 22.62 kg/m.  Nutrition Problem: Severe Malnutrition Etiology: social / environmental circumstances (homelessness) Signs/Symptoms: energy intake < 75% for > or equal to 3 months, severe muscle depletion   Discharge Exam:   Vitals:   10/27/21 0021 10/27/21 0540 10/27/21 0714 10/27/21 1118  BP: (!) 149/89 (!) 173/92 (!) 161/98 (!) 147/80  Pulse: 71 60 60 63  Resp: 18 18 19 18   Temp: 98.2 F (36.8 C) 97.9 F (36.6 C) 98 F (36.7 C) 97.9 F (36.6 C)  TempSrc: Oral Oral Oral Oral  SpO2: 97% 99% 99% 99%  Weight:  67.5 kg    Height:        Body mass index is 22.62 kg/m.   General exam: Pleasant middle-aged  Caucasian male.  Looks older for his stated age Skin: No rashes, lesions or ulcers. HEENT: Atraumatic, normocephalic, no obvious bleeding Lungs: Clear to auscultation bilaterally.  Limited lung excursion because of pain CVS: Regular rate and rhythm, no murmur GI/Abd soft, nontender, nondistended, bowel sound present CNS:  Alert, awake, oriented x3 Psychiatry: Sad affect Extremities: No pedal edema, no calf tenderness  Follow ups:    Follow-up Information     [facility] Follow up.   Contact information: [address] [place] [address] [phone]                Discharge Instructions:   Discharge Instructions     Call MD for:  difficulty breathing, headache or visual disturbances   Complete by: As directed    Call MD for:  extreme fatigue   Complete by: As directed    Call MD for:  hives   Complete by: As directed    Call MD for:  persistant dizziness or light-headedness   Complete by: As directed    Call MD for:  persistant nausea and vomiting   Complete by: As directed    Call MD for:  severe uncontrolled pain   Complete by: As directed    Call MD for:  temperature >100.4   Complete by: As directed    Diet general   Complete by: As directed    Discharge instructions   Complete by: As directed    Recommendations at discharge:  1. Please do not miss a dose of your blood thinner  General discharge instructions: Follow with Primary MD Pcp, No in 7 days  Please request your PCP  to go over your hospital tests, procedures, radiology results at the follow up. Please get your medicines reviewed and adjusted.  Your PCP may decide to repeat certain labs or tests as needed. Do not drive, operate heavy machinery, perform activities at heights, swimming or participation in water activities or provide baby sitting services if your were admitted for syncope or siezures until you have seen by Primary MD or a Neurologist and advised to do so again. [place] Controlled Substance Reporting System database was reviewed. Do not drive, operate heavy machinery, perform activities at heights, swim, participate in water activities or provide baby-sitting services while on medications for pain, sleep and mood until your outpatient physician has  reevaluated you and advised to do so again.  You are strongly recommended to comply with the dose, frequency and duration of prescribed medications. Activity: As tolerated with Full fall precautions use walker/cane & assistance as needed Avoid using any recreational substances like cigarette, tobacco, alcohol, or non-prescribed drug. If you experience worsening of your admission symptoms, develop shortness of breath, life threatening emergency, suicidal or homicidal thoughts you must seek medical attention immediately by calling 911 or calling your MD immediately  if symptoms less severe. You must read complete instructions/literature along with all the possible adverse reactions/side effects for all the medicines you take and that have been prescribed to you. Take any new medicine only after you have completely understood and accepted all the possible adverse reactions/side effects.  Wear Seat belts while driving. You were cared for by a hospitalist during your hospital stay. If you have any questions about your discharge medications or the care you received while you were in the hospital after you are discharged, you can call the unit and ask to speak with the hospitalist or the covering physician. Once you  are discharged, your primary care physician will handle any further medical issues. Please note that NO REFILLS for any discharge medications will be authorized once you are discharged, as it is imperative that you return to your primary care physician (or establish a relationship with a primary care physician if you do not have one).   Increase activity slowly   Complete by: As directed        Discharge Medications:   Allergies as of 10/27/2021   No Known Allergies      Medication List     STOP taking these medications    apixaban 5 MG Tabs tablet Commonly known as: ELIQUIS Replaced by: Apixaban Starter Pack (10mg  and 5mg )       TAKE these medications    amLODipine 5 MG  tablet Commonly known as: NORVASC Take 1 tablet (5 mg total) by mouth daily.   Apixaban Starter Pack (10mg  and 5mg ) Commonly known as: ELIQUIS STARTER PACK Take as directed on package: start with two-5mg  tablets twice daily for 7 days. On day 8, switch to one-5mg  tablet twice daily. Replaces: apixaban 5 MG Tabs tablet   ibuprofen 600 MG tablet Commonly known as: ADVIL Take 1 tablet (600 mg total) by mouth every 8 (eight) hours as needed for moderate pain.         The results of significant diagnostics from this hospitalization (including imaging, microbiology, ancillary and laboratory) are listed below for reference.    Procedures and Diagnostic Studies:   CT HEAD WO CONTRAST (5MM)  Result Date: 10/24/2021 CLINICAL DATA:  Recent syncopal episode EXAM: CT HEAD WITHOUT CONTRAST CT CERVICAL SPINE WITHOUT CONTRAST TECHNIQUE: Multidetector CT imaging of the head and cervical spine was performed following the standard protocol without intravenous contrast. Multiplanar CT image reconstructions of the cervical spine were also generated. RADIATION DOSE REDUCTION: This exam was performed according to the departmental dose-optimization program which includes automated exposure control, adjustment of the mA and/or kV according to patient size and/or use of iterative reconstruction technique. COMPARISON:  10/06/2021 FINDINGS: CT HEAD FINDINGS Brain: No evidence of acute infarction, hemorrhage, hydrocephalus, extra-axial collection or mass lesion/mass effect. Mild atrophic changes and chronic white matter ischemic changes noted. Vascular: No hyperdense vessel or unexpected calcification. Skull: Normal. Negative for fracture or focal lesion. Sinuses/Orbits: No acute finding. Other: None. CT CERVICAL SPINE FINDINGS Alignment: Within normal limits. Skull base and vertebrae: 7 cervical segments are well visualized. Vertebral body height is well maintained. Multilevel osteophytic changes and facet hypertrophic  changes are seen. No acute fracture or acute facet abnormality is noted. Soft tissues and spinal canal: Surrounding soft tissue structures are within normal limits. Upper chest: Visualized lung apices are unremarkable. Other: None IMPRESSION: CT of the head: Chronic atrophic and ischemic changes without acute abnormality. CT of the cervical spine: Multilevel degenerative changes without acute abnormality. Electronically Signed   By: Alcide CleverMark  Lukens M.D.   On: 10/24/2021 21:22   CT Angio Chest PE W and/or Wo Contrast  Result Date: 10/24/2021 CLINICAL DATA:  Recent syncopal episode EXAM: CT ANGIOGRAPHY CHEST WITH CONTRAST TECHNIQUE: Multidetector CT imaging of the chest was performed using the standard protocol during bolus administration of intravenous contrast. Multiplanar CT image reconstructions and MIPs were obtained to evaluate the vascular anatomy. RADIATION DOSE REDUCTION: This exam was performed according to the departmental dose-optimization program which includes automated exposure control, adjustment of the mA and/or kV according to patient size and/or use of iterative reconstruction technique. CONTRAST:  100mL OMNIPAQUE IOHEXOL 350 MG/ML  SOLN COMPARISON:  CT from 10/06/2021 FINDINGS: Cardiovascular: Thoracic aorta demonstrates atherosclerotic calcifications without aneurysmal dilatation or dissection. No cardiac enlargement is seen. Coronary calcifications are noted. The pulmonary artery shows bilateral filling defects consistent with pulmonary emboli primarily within the lower lobe branches. Some right upper lobe pulmonary emboli are seen as well. Mild right heart strain is noted within RV/LV ratio of 1. Some of the previously seen emboli have resolved although some new emboli are noted. Mediastinum/Nodes: Thoracic inlet is within normal limits. No sizable hilar or mediastinal adenopathy is noted. The esophagus as visualized is within normal limits. Lungs/Pleura: Lungs demonstrates some mild dependent  atelectatic changes. No focal infiltrate or sizable effusion is seen. No parenchymal nodules are noted. Upper Abdomen: Visualized upper abdomen shows no acute abnormality. Musculoskeletal: Degenerative changes of the thoracic spine are noted. No rib abnormality is noted. Review of the MIP images confirms the above findings. IMPRESSION: Changes consistent with bilateral pulmonary emboli with mild right heart strain. The degree of pulmonary emboli has overall worsened slightly in the interval from the prior exam. No other focal abnormality is noted. Aortic Atherosclerosis (ICD10-I70.0). Critical Value/emergent results were called by telephone at the time of interpretation on 10/24/2021 at 9:15 pm to Dr. Artis Delay , who verbally acknowledged these results. Electronically Signed   By: Alcide Clever M.D.   On: 10/24/2021 21:19   CT Cervical Spine Wo Contrast  Result Date: 10/24/2021 CLINICAL DATA:  Recent syncopal episode EXAM: CT HEAD WITHOUT CONTRAST CT CERVICAL SPINE WITHOUT CONTRAST TECHNIQUE: Multidetector CT imaging of the head and cervical spine was performed following the standard protocol without intravenous contrast. Multiplanar CT image reconstructions of the cervical spine were also generated. RADIATION DOSE REDUCTION: This exam was performed according to the departmental dose-optimization program which includes automated exposure control, adjustment of the mA and/or kV according to patient size and/or use of iterative reconstruction technique. COMPARISON:  10/06/2021 FINDINGS: CT HEAD FINDINGS Brain: No evidence of acute infarction, hemorrhage, hydrocephalus, extra-axial collection or mass lesion/mass effect. Mild atrophic changes and chronic white matter ischemic changes noted. Vascular: No hyperdense vessel or unexpected calcification. Skull: Normal. Negative for fracture or focal lesion. Sinuses/Orbits: No acute finding. Other: None. CT CERVICAL SPINE FINDINGS Alignment: Within normal limits. Skull  base and vertebrae: 7 cervical segments are well visualized. Vertebral body height is well maintained. Multilevel osteophytic changes and facet hypertrophic changes are seen. No acute fracture or acute facet abnormality is noted. Soft tissues and spinal canal: Surrounding soft tissue structures are within normal limits. Upper chest: Visualized lung apices are unremarkable. Other: None IMPRESSION: CT of the head: Chronic atrophic and ischemic changes without acute abnormality. CT of the cervical spine: Multilevel degenerative changes without acute abnormality. Electronically Signed   By: Alcide Clever M.D.   On: 10/24/2021 21:22     Labs:   Basic Metabolic Panel: Recent Labs  Lab 10/24/21 2028 10/24/21 2221 10/25/21 0638 10/27/21 0646  NA 134*  --  137 137  K 3.4*  --  4.7 4.3  CL 99  --  104 105  CO2 27  --  28 26  GLUCOSE 123*  --  107* 98  BUN 9  --  8 12  CREATININE 0.89  --  0.90 0.72  CALCIUM 9.4  --  9.9 9.4  MG  --  1.7  --   --   PHOS  --  4.0  --   --    GFR Estimated Creatinine Clearance: 94.9 mL/min (by C-G  formula based on SCr of 0.72 mg/dL). Liver Function Tests: Recent Labs  Lab 10/24/21 2028  AST 46*  ALT 27  ALKPHOS 66  BILITOT 0.6  PROT 7.3  ALBUMIN 3.7   No results for input(s): LIPASE, AMYLASE in the last 168 hours. No results for input(s): AMMONIA in the last 168 hours. Coagulation profile Recent Labs  Lab 10/24/21 2221  INR 1.1    CBC: Recent Labs  Lab 10/24/21 2028 10/25/21 0638 10/26/21 0636 10/27/21 0646  WBC 8.2 6.8 5.2 5.6  NEUTROABS 5.0  --   --  3.4  HGB 13.0 13.9 12.5* 12.7*  HCT 38.6* 41.2 37.9* 37.8*  MCV 100.3* 101.0* 100.5* 98.7  PLT 145* 150 133* 144*   Cardiac Enzymes: No results for input(s): CKTOTAL, CKMB, CKMBINDEX, TROPONINI in the last 168 hours. BNP: Invalid input(s): POCBNP CBG: No results for input(s): GLUCAP in the last 168 hours. D-Dimer No results for input(s): DDIMER in the last 72 hours. Hgb A1c No  results for input(s): HGBA1C in the last 72 hours. Lipid Profile No results for input(s): CHOL, HDL, LDLCALC, TRIG, CHOLHDL, LDLDIRECT in the last 72 hours. Thyroid function studies Recent Labs    10/24/21 2221  TSH 2.032   Anemia work up No results for input(s): VITAMINB12, FOLATE, FERRITIN, TIBC, IRON, RETICCTPCT in the last 72 hours. Microbiology Recent Results (from the past 240 hour(s))  Resp Panel by RT-PCR (Flu A&B, Covid) Nasopharyngeal Swab     Status: None   Collection Time: 10/24/21 10:21 PM   Specimen: Nasopharyngeal Swab; Nasopharyngeal(NP) swabs in vial transport medium  Result Value Ref Range Status   SARS Coronavirus 2 by RT PCR NEGATIVE NEGATIVE Final    Comment: (NOTE) SARS-CoV-2 target nucleic acids are NOT DETECTED.  The SARS-CoV-2 RNA is generally detectable in upper respiratory specimens during the acute phase of infection. The lowest concentration of SARS-CoV-2 viral copies this assay can detect is 138 copies/mL. A negative result does not preclude SARS-Cov-2 infection and should not be used as the sole basis for treatment or other patient management decisions. A negative result may occur with  improper specimen collection/handling, submission of specimen other than nasopharyngeal swab, presence of viral mutation(s) within the areas targeted by this assay, and inadequate number of viral copies(<138 copies/mL). A negative result must be combined with clinical observations, patient history, and epidemiological information. The expected result is Negative.  Fact Sheet for Patients:  BloggerCourse.com  Fact Sheet for Healthcare Providers:  SeriousBroker.it  This test is no t yet approved or cleared by the [place] FDA and  has been authorized for detection and/or diagnosis of SARS-CoV-2 by FDA under an Emergency Use Authorization (EUA). This EUA will remain  in effect (meaning this test can be used)  for the duration of the COVID-19 declaration under Section 564(b)(1) of the Act, 21 U.S.C.section 360bbb-3(b)(1), unless the authorization is terminated  or revoked sooner.       Influenza A by PCR NEGATIVE NEGATIVE Final   Influenza B by PCR NEGATIVE NEGATIVE Final    Comment: (NOTE) The Xpert Xpress SARS-CoV-2/FLU/RSV plus assay is intended as an aid in the diagnosis of influenza from Nasopharyngeal swab specimens and should not be used as a sole basis for treatment. Nasal washings and aspirates are unacceptable for Xpert Xpress SARS-CoV-2/FLU/RSV testing.  Fact Sheet for Patients: BloggerCourse.com  Fact Sheet for Healthcare Providers: SeriousBroker.it  This test is not yet approved or cleared by the [place] FDA and has been authorized for detection  and/or diagnosis of SARS-CoV-2 by FDA under an Emergency Use Authorization (EUA). This EUA will remain in effect (meaning this test can be used) for the duration of the COVID-19 declaration under Section 564(b)(1) of the Act, 21 U.S.C. section 360bbb-3(b)(1), unless the authorization is terminated or revoked.  Performed at [facility], [facility], [facility], [address] [place], [place] [address]     Time coordinating discharge: 25 minutes  Signed: Lorin GlassBinaya Halsey Persaud  [facility] 10/27/2021, 1:03 PM

## 2021-10-27 NOTE — ED Triage Notes (Signed)
Pt in via carwash. Pt is homeless and cold and c/o CP. ETOH on board. Pt also agitated. Pt was given 325mg  of asa from EMS, 143/97, 98 HR. 96% RA, FSBS 131, 98.3 temp

## 2021-10-28 ENCOUNTER — Emergency Department
Admission: EM | Admit: 2021-10-28 | Discharge: 2021-10-28 | Disposition: A | Discharge status: Home / Self Care | Payer: Medicaid Other | Attending: Emergency Medicine | Admitting: Emergency Medicine

## 2021-10-28 ENCOUNTER — Emergency Department: Payer: Medicaid Other

## 2021-10-28 DIAGNOSIS — Z5900 Homelessness unspecified: Secondary | ICD-10-CM

## 2021-10-28 DIAGNOSIS — Z86711 Personal history of pulmonary embolism: Secondary | ICD-10-CM

## 2021-10-28 DIAGNOSIS — Z765 Malingerer [conscious simulation]: Secondary | ICD-10-CM

## 2021-10-28 DIAGNOSIS — R079 Chest pain, unspecified: Secondary | ICD-10-CM

## 2021-10-28 LAB — COMPREHENSIVE METABOLIC PANEL
ALT: 27 U/L (ref 0–44)
AST: 39 U/L (ref 15–41)
Albumin: 4.2 g/dL (ref 3.5–5.0)
Alkaline Phosphatase: 69 U/L (ref 38–126)
Anion gap: 10 (ref 5–15)
BUN: 8 mg/dL (ref 6–20)
CO2: 27 mmol/L (ref 22–32)
Calcium: 10.1 mg/dL (ref 8.9–10.3)
Chloride: 105 mmol/L (ref 98–111)
Creatinine, Ser: 0.73 mg/dL (ref 0.61–1.24)
GFR, Estimated: >60 mL/min (ref >60)
Glucose, Bld: 99 mg/dL (ref 70–99)
Potassium: 4.4 mmol/L (ref 3.5–5.1)
Sodium: 142 mmol/L (ref 135–145)
Total Bilirubin: 0.3 mg/dL (ref 0.3–1.2)
Total Protein: 8.7 g/dL — ABNORMAL HIGH (ref 6.5–8.1)

## 2021-10-28 LAB — LIPASE, BLOOD: Lipase: 35 U/L (ref 11–51)

## 2021-10-28 LAB — TROPONIN I (HIGH SENSITIVITY)
Troponin I (High Sensitivity): 8 ng/L (ref <18)
Troponin I (High Sensitivity): 8 ng/L (ref <18)

## 2021-10-28 NOTE — ED Notes (Signed)
Pt sleeping in lobby, in no acute distress 

## 2021-10-28 NOTE — Discharge Instructions (Addendum)
There is no evidence that you have an emergent medical condition tonight.  Please take the medication you have been provided in the past.  Avoid drugs and alcohol.  [facility] primary care provider.

## 2021-10-28 NOTE — ED Provider Notes (Addendum)
[facility] Provider Note    Event Date/Time   First MD Initiated Contact with Patient 10/28/21 [phone]     (approximate)   History   Chest Pain and Alcohol Intoxication   HPI  Jose [name] is a 61 y.o. male who is very well-known to the emergency department with this being his 27th ED visit in 6 months with 1 prior admission.  His medical history is notable for alcohol abuse, homelessness, relatively recent diagnosis of pulmonary emboli, heart disease, hypertension, and medication noncompliance.  He presents tonight by EMS.  He was picked up at a car wash at approximately 10 PM (the temperature is in the 20s F tonight) and reported that he was cold and having chest pain.  He admits to drinking alcohol recently.  He was agitated and irritable initially and then he fell asleep.  He was given a full dose aspirin prior to arrival.  He slept for an extended period of time prior to being roomed and seen.  When awakened, the patient reports severe and persistent pain similar to that that he has been having for weeks or months.  Nothing in particular makes it better or worse.  He also asked for something to eat and drink.  He states that in spite of being given multiple prescriptions of Eliquis, both a supply of the medication and the prescription, he has not been taking it, but he plans on getting some tomorrow.  Of note, he was discharged from the hospital about 10 hours prior to his rearrival in the emergency department after he had been admitted for possible worsening pulmonary emboli with right heart strain and medication noncompliance.   Physical Exam   Triage Vital Signs: ED Triage Vitals  Enc Vitals Group     BP 10/27/21 2320 123/87     Pulse Rate 10/27/21 2320 (!) 104     Resp 10/27/21 2320 19     Temp 10/27/21 2320 98.4 F (36.9 C)     Temp Source 10/27/21 2320 Oral     SpO2 10/27/21 2320 96 %     Weight 10/27/21 2327 67.5 kg (148 lb 13 oz)      Height 10/27/21 2327 1.727 m (5\' 8" )     Head Circumference --      Peak Flow --      Pain Score 10/27/21 2326 9     Pain Loc --      Pain Edu? --      Excl. in GC? --     Most recent vital signs: Vitals:   10/28/21 0355 10/28/21 0604  BP: 121/75 (!) 156/97  Pulse: (!) 111 87  Resp: 18 16  Temp:    SpO2: 100% 98%     General: Awake, no distress.  CV:  Good peripheral perfusion.  Normal heart sounds. Resp:  Normal effort.  Lungs are clear to auscultation. Abd:  No distention.    ED Results / Procedures / Treatments   Labs (all labs ordered are listed, but only abnormal results are displayed) Labs Reviewed  CBC WITH DIFFERENTIAL/PLATELET - Abnormal; Notable for the following components:      Result Value   MCV 100.5 (*)    All other components within normal limits  COMPREHENSIVE METABOLIC PANEL - Abnormal; Notable for the following components:   Total Protein 8.7 (*)    All other components within normal limits  LIPASE, BLOOD  TROPONIN I (HIGH SENSITIVITY)  TROPONIN I (HIGH SENSITIVITY)  EKG  ED ECG REPORT I, Loleta Rose, the attending physician, personally viewed and interpreted this ECG.  Date: 10/27/2021 EKG Time: 23: 25 Rate: 87 Rhythm: normal sinus rhythm QRS Axis: normal Intervals: Right bundle branch block ST/T Wave abnormalities: normal Narrative Interpretation: no evidence of acute ischemia    RADIOLOGY I personally reviewed the patient's chest x-ray and there are no acute abnormalities    PROCEDURES:  Critical Care performed: No  .1-3 Lead EKG Interpretation Performed by: Loleta Rose, MD Authorized by: Loleta Rose, MD     Interpretation: normal     ECG rate:  88   ECG rate assessment: normal     Rhythm: sinus rhythm     Ectopy: none     Conduction: normal     MEDICATIONS ORDERED IN ED: Medications - No data to display   IMPRESSION / MDM / ASSESSMENT AND PLAN / ED COURSE  I reviewed the triage vital signs and the  nursing notes.                              Differential diagnosis includes, but is not limited to, malingering, homelessness, subacute pulmonary emboli, heart disease, pneumonia, musculoskeletal pain, drug-seeking behavior.  The patient was on the cardiac monitor to evaluate for evidence of arrhythmia and/or significant heart rate changes.  I strongly feel that the patient is malingering.  He seems willfully noncompliant with his medication regimen, as he has been given supplies of medication in the past and either lost them or claims they are stolen.  His visits to the emergency department escalate when the weather is unpleasant.  He has chronic and persistent chest pain, and while it is certainly the case that he has true disease in the form of pulmonary emboli as well as all his chronic conditions, there is no evidence that he has an acute medical condition at this time.  In fact, I reviewed the discharge summary from earlier today/yesterday when he was kept for what appears to be less than 24 hours, originally started on heparin but then transition back to Eliquis on discharge.  For tonight's visit I reviewed his EKG which shows no sign of ischemia.  His vital signs are stable and within normal limits.  I personally reviewed his chest x-ray and there is no evidence of any acute abnormality or infection.  Labs ordered in triage include CBC with differential, comprehensive metabolic panel, lipase, and high-sensitivity troponin x2.  I reviewed all the results and they are reassuring, and all of them fall essentially within normal limits with no concerning abnormalities, particularly including his 2 high-sensitivity troponins.  There is no evidence that he requires additional or different treatment or hospitalization.  I advised the patient of all of this and explained that he cannot continue to stay in the emergency department in the absence of an acute medical complaint.  I strongly encouraged  outpatient follow-up as has been recommended to him in the past and he nodded his head.  He is tolerating oral intake in the ED and appropriate for discharge and outpatient follow-up.     FINAL CLINICAL IMPRESSION(S) / ED DIAGNOSES   Final diagnoses:  Malingering  Chest pain, unspecified type  Homeless  History of pulmonary embolism     Rx / DC Orders   ED Discharge Orders     None        Note:  This document was prepared using Dragon voice recognition  software and may include unintentional dictation errors.   Loleta Rose, MD 10/28/21 3235    Loleta Rose, MD 10/28/21 0800

## 2021-10-30 ENCOUNTER — Emergency Department
Admission: EM | Admit: 2021-10-30 | Discharge: 2021-10-31 | Disposition: A | Discharge status: Home / Self Care | Payer: Medicaid Other | Attending: Emergency Medicine | Admitting: Emergency Medicine

## 2021-10-30 ENCOUNTER — Encounter: Payer: Self-pay | Admitting: Emergency Medicine

## 2021-10-30 DIAGNOSIS — Z765 Malingerer [conscious simulation]: Secondary | ICD-10-CM | POA: Diagnosis not present

## 2021-10-30 DIAGNOSIS — R079 Chest pain, unspecified: Secondary | ICD-10-CM

## 2021-10-30 DIAGNOSIS — Z5900 Homelessness unspecified: Secondary | ICD-10-CM | POA: Diagnosis not present

## 2021-10-30 DIAGNOSIS — I10 Essential (primary) hypertension: Secondary | ICD-10-CM | POA: Insufficient documentation

## 2021-10-30 DIAGNOSIS — Z7901 Long term (current) use of anticoagulants: Secondary | ICD-10-CM | POA: Insufficient documentation

## 2021-10-30 LAB — URINALYSIS, ROUTINE W REFLEX MICROSCOPIC
Bacteria, UA: NONE SEEN
Bilirubin Urine: NEGATIVE
Glucose, UA: NEGATIVE mg/dL
Hgb urine dipstick: NEGATIVE
Ketones, ur: NEGATIVE mg/dL
Leukocytes,Ua: NEGATIVE
Nitrite: NEGATIVE
Protein, ur: NEGATIVE mg/dL
Specific Gravity, Urine: <1.005 — ABNORMAL LOW (ref 1.005–1.030)
pH: 5 (ref 5.0–8.0)

## 2021-10-30 NOTE — ED Triage Notes (Signed)
Pt arrived via [facility] with left sided chest pain that radiates into left arm. Pt also c/o N/V. All symptoms worsening over last 12 hours with chest pain reoccurring over last 3 months. Pt denies SOB.

## 2021-10-31 NOTE — Discharge Instructions (Signed)
Please make sure to get the prescribed Eliquis at the [facility] and take it as prescribed. Blood clots tend to get worse if not treated and may lead to your death. Return to the ER for worsening chest pain and shortness of breath

## 2021-10-31 NOTE — ED Notes (Signed)
Patient is sleeping, chest rise and fall observed. He has snoring respirations. Vitals are stable. No complaints at this time. Awaiting disposition.

## 2021-10-31 NOTE — ED Notes (Signed)
Pt given a sprite and will call transport for sandwiches.

## 2021-10-31 NOTE — ED Provider Notes (Addendum)
[facility] Provider Note    Event Date/Time   First MD Initiated Contact with Patient 10/30/21 2256     (approximate)   History   Chest Pain   HPI  Jose [name] is a 61 y.o. male with a history of polysubstance abuse, chronic chest pain, PEs, medication noncompliance, hypertension, hepatitis C, homelessness, malingering, and 28 previous visits to the ED in the last 6 months who presents for homelessness and chronic chest pain. Patient presents via EMS for being on the streets and cold tonight. Complaining of his chronic chest pain that he has constantly for months. The pain is constant and unchanged no SOB or fever. Has not been taking his Eliquis. Patient has been provided multiple prescriptions and has been seen by case manager who was able to get patient his prescription approved to be filled at our pharmacy. All patient has to do is go to the pharmacy when it opens in the morning. Instead, every morning he leaves the ED and goes back to the street to drink, refuses to fill the prescription "because he cannot afford the $4 dollar copay." When asked how he is able to afford the alcohol he drinks but not the Eliquis, he gets angry and demands narcotic pain medication. Patient reports that his niece is coming to see him from out of town and take him with her so she can care for him on Thursday.      Past Medical History:  Diagnosis Date   Hep C w/o coma, chronic (HCC)    Hypertension    Polysubstance abuse (HCC)     Past Surgical History:  Procedure Laterality Date   ESOPHAGOGASTRODUODENOSCOPY (EGD) WITH PROPOFOL N/A 03/02/2019   Procedure: ESOPHAGOGASTRODUODENOSCOPY (EGD) WITH PROPOFOL;  Surgeon: Violeta Gelinas, MD;  Location: [facility] ENDOSCOPY;  Service: General;  Laterality: N/A;   IR GASTROSTOMY TUBE REMOVAL  05/04/2019   PEG PLACEMENT N/A 03/02/2019   Procedure: PERCUTANEOUS ENDOSCOPIC GASTROSTOMY (PEG) PLACEMENT;  Surgeon: Violeta Gelinas, MD;   Location: [facility] ENDOSCOPY;  Service: General;  Laterality: N/A;   PERCUTANEOUS TRACHEOSTOMY N/A 02/12/2019   Procedure: PERCUTANEOUS TRACHEOSTOMY;  Surgeon: Violeta Gelinas, MD;  Location: [facility] OR;  Service: General;  Laterality: N/A;     Physical Exam   Triage Vital Signs: ED Triage Vitals  Enc Vitals Group     BP 10/30/21 2155 137/90     Pulse Rate 10/30/21 2155 87     Resp 10/30/21 2155 16     Temp 10/30/21 2155 98.4 F (36.9 C)     Temp Source 10/30/21 2155 Oral     SpO2 10/30/21 2155 100 %     Weight 10/30/21 2239 150 lb (68 kg)     Height 10/30/21 2239 5\' 8"  (1.727 m)     Head Circumference --      Peak Flow --      Pain Score 10/30/21 2359 Asleep     Pain Loc --      Pain Edu? --      Excl. in GC? --     Most recent vital signs: Vitals:   10/31/21 0112 10/31/21 0245  BP: 139/86 124/84  Pulse: 74 79  Resp: 13 17  Temp:    SpO2: 100% 99%     Constitutional: Alert and oriented. Well appearing and in no apparent distress. HEENT:      Head: Normocephalic and atraumatic.         Eyes: Conjunctivae are normal. Sclera is non-icteric.  Mouth/Throat: Mucous membranes are moist.       Neck: Supple with no signs of meningismus. Cardiovascular: Regular rate and rhythm. No murmurs, gallops, or rubs. 2+ symmetrical distal pulses are present in all extremities.  Respiratory: Normal respiratory effort. Lungs are clear to auscultation bilaterally.  Gastrointestinal: Soft, non tender, and non distended with positive bowel sounds. No rebound or guarding. Genitourinary: No CVA tenderness. Musculoskeletal:  No edema, cyanosis, or erythema of extremities. Neurologic: Normal speech and language. Face is symmetric. Moving all extremities. No gross focal neurologic deficits are appreciated. Skin: Skin is warm, dry and intact. No rash noted. Psychiatric: Mood and affect are normal. Speech and behavior are normal.  ED Results / Procedures / Treatments   Labs (all labs ordered are  listed, but only abnormal results are displayed) Labs Reviewed  URINALYSIS, ROUTINE W REFLEX MICROSCOPIC - Abnormal; Notable for the following components:      Result Value   APPearance CLEAR (*)    Specific Gravity, Urine <1.005 (*)    All other components within normal limits     EKG  ED ECG REPORT I, Nita Sickle, the attending physician, personally viewed and interpreted this ECG.  Sinus rhythm, rate 95, RBBB, no STE or depression. Unchanged from prior  RADIOLOGY none   PROCEDURES:  Critical Care performed: No  Procedures    IMPRESSION / MDM / ASSESSMENT AND PLAN / ED COURSE  I reviewed the triage vital signs and the nursing notes.  61 y.o. male with a history of polysubstance abuse, chronic chest pain, PEs, medication noncompliance, hypertension, hepatitis C, homelessness, malingering, and 28 previous visits to the ED in the last 6 months who presents for homelessness and chronic chest pain. Has not been taking his Eliquis. Patient has been provided multiple prescriptions and has been seen by case manager who was able to get patient his prescription approved to be filled at our pharmacy. All patient has to do is go to the pharmacy when it opens in the morning. Instead, every morning he leaves the ED and goes back to the street to drink, refuses to fill the prescription "because he cannot afford the $4 dollar copay." When asked how he is able to afford the alcohol he drinks but not the Eliquis, he gets angry and demands narcotic pain medication. Patient with stable vitals, unchanged EKG, no tachycardia, tachypnea, or hypoxia.  I had a very long discussion with the patient and explained to him that by choosing not to take his Eliquis and use his money instead to buy alcohol, there is a very big chance that the blood clots will continue to grow to the point that he will undergo cardiac arrest and die and that it is extremely important that he gets the Eliquis that is waiting  for him at the pharmacy.  I also explained to him that by taking the Eliquis, the blood clots will eventually disappear and his chest pain will go away and therefore he will not have to keep coming to the emergency room so often for the chest pain.  I have told him that we will no longer provide him with any narcotic pain medication in this department unless he participates on his own medical care and treatment plan. Due to the extreme cold temperatures outside, I did tell him, that he can spend the night in the ED since he has no shelter to go tonight. He asked for food and drinks which were also provided for him. I do not feel  like patient warrants any labs and imaging at this time as we know that he has PEs seen on recent CTA done just 1 week ago but he is HD stable with no signs of hypoxia and just simply refuses to get his prescribed Eliquis. After that discussion, patient thanked me and went to sleep. Patient sleeping comfortably throughout the entire night with no signs of any pain or distress. Plan to discharge in the morning.  _________________________ 6:40 AM on 10/31/2021 ----------------------------------------- Patient slept all night with no signs of distress or any complaints of pain.  Stable for discharge.  FINAL CLINICAL IMPRESSION(S) / ED DIAGNOSES   Final diagnoses:  Malingering  Chest pain, unspecified type     Rx / DC Orders   ED Discharge Orders     None        Note:  This document was prepared using Dragon voice recognition software and may include unintentional dictation errors.   Please note:  Patient was evaluated in Emergency Department today for the symptoms described in the history of present illness. Patient was evaluated in the context of the global COVID-19 pandemic, which necessitated consideration that the patient might be at risk for infection with the SARS-CoV-2 virus that causes COVID-19. Institutional protocols and algorithms that pertain to the  evaluation of patients at risk for COVID-19 are in a state of rapid change based on information released by regulatory bodies including the CDC and federal and state organizations. These policies and algorithms were followed during the patient's care in the ED.  Some ED evaluations and interventions may be delayed as a result of limited staffing during the pandemic.       Don Perking, [place], MD 10/31/21 3646    Nita Sickle, MD 10/31/21 [phone]

## 2021-10-31 NOTE — ED Notes (Signed)
Pt woke up, grabbed his food and left with out proper D/C

## 2021-10-31 NOTE — ED Notes (Signed)
Pt is sleeping, chest rise and fall observed. Vitals are stable. No needs or concerns at this time. Awaiting discharge in the morning.

## 2021-11-01 ENCOUNTER — Emergency Department: Payer: Medicaid Other

## 2021-11-01 ENCOUNTER — Emergency Department
Admission: EM | Admit: 2021-11-01 | Discharge: 2021-11-02 | Disposition: A | Discharge status: Home / Self Care | Payer: Medicaid Other | Attending: Emergency Medicine | Admitting: Emergency Medicine

## 2021-11-01 ENCOUNTER — Other Ambulatory Visit: Payer: Self-pay

## 2021-11-01 DIAGNOSIS — R55 Syncope and collapse: Secondary | ICD-10-CM | POA: Insufficient documentation

## 2021-11-01 DIAGNOSIS — R079 Chest pain, unspecified: Secondary | ICD-10-CM | POA: Diagnosis present

## 2021-11-01 DIAGNOSIS — I10 Essential (primary) hypertension: Secondary | ICD-10-CM | POA: Diagnosis not present

## 2021-11-01 DIAGNOSIS — R5383 Other fatigue: Secondary | ICD-10-CM | POA: Insufficient documentation

## 2021-11-01 DIAGNOSIS — G8929 Other chronic pain: Secondary | ICD-10-CM | POA: Diagnosis not present

## 2021-11-01 DIAGNOSIS — Z7901 Long term (current) use of anticoagulants: Secondary | ICD-10-CM | POA: Insufficient documentation

## 2021-11-01 LAB — CBC WITH DIFFERENTIAL/PLATELET
Abs Immature Granulocytes: 0.03 10*3/uL (ref 0.00–0.07)
Basophils Absolute: 0 10*3/uL (ref 0.0–0.1)
Basophils Relative: 1 %
Eosinophils Absolute: 0.1 10*3/uL (ref 0.0–0.5)
Eosinophils Relative: 2 %
HCT: 40 % (ref 39.0–52.0)
Hemoglobin: 13.6 g/dL (ref 13.0–17.0)
Immature Granulocytes: 0 %
Lymphocytes Relative: 35 %
Lymphs Abs: 2.4 10*3/uL (ref 0.7–4.0)
MCH: 33.1 pg (ref 26.0–34.0)
MCHC: 34 g/dL (ref 30.0–36.0)
MCV: 97.3 fL (ref 80.0–100.0)
Monocytes Absolute: 0.8 10*3/uL (ref 0.1–1.0)
Monocytes Relative: 12 %
Neutro Abs: 3.6 10*3/uL (ref 1.7–7.7)
Neutrophils Relative %: 50 %
Platelets: 191 10*3/uL (ref 150–400)
RBC: 4.11 MIL/uL — ABNORMAL LOW (ref 4.22–5.81)
RDW: 11.9 % (ref 11.5–15.5)
WBC: 7.1 10*3/uL (ref 4.0–10.5)
nRBC: 0 % (ref 0.0–0.2)

## 2021-11-01 LAB — BASIC METABOLIC PANEL
Anion gap: 9 (ref 5–15)
BUN: 8 mg/dL (ref 6–20)
CO2: 24 mmol/L (ref 22–32)
Calcium: 9.2 mg/dL (ref 8.9–10.3)
Chloride: 105 mmol/L (ref 98–111)
Creatinine, Ser: 0.62 mg/dL (ref 0.61–1.24)
GFR, Estimated: >60 mL/min (ref >60)
Glucose, Bld: 91 mg/dL (ref 70–99)
Potassium: 3.4 mmol/L — ABNORMAL LOW (ref 3.5–5.1)
Sodium: 138 mmol/L (ref 135–145)

## 2021-11-01 LAB — TROPONIN I (HIGH SENSITIVITY): Troponin I (High Sensitivity): 7 ng/L (ref <18)

## 2021-11-01 MED ORDER — APIXABAN 5 MG PO TABS
5.0000 mg | ORAL_TABLET | Freq: Once | ORAL | Status: AC
  Administered 2021-11-01: 5 mg via ORAL
  Filled 2021-11-01: qty 1

## 2021-11-01 MED ORDER — OXYCODONE-ACETAMINOPHEN 5-325 MG PO TABS
1.0000 | ORAL_TABLET | Freq: Once | ORAL | Status: AC
  Administered 2021-11-01: 1 via ORAL
  Filled 2021-11-01: qty 1

## 2021-11-01 NOTE — ED Triage Notes (Signed)
Presents by EMS for chest pain.

## 2021-11-01 NOTE — ED Provider Notes (Signed)
[name] Provider Note    Event Date/Time   First MD Initiated Contact with Patient 11/01/21 2133     (approximate)   History   Chest Pain   HPI  Jose [name] is a 61 y.o. male with past medical history of PE, hypertension, chronic chest pain, recurrent syncope, chronic nonadherence, here with multiple complaints.  The patient was just seen yesterday for similar issues.  He states that he has chest pain, has had episodes of passing out, and had worsening fatigue.  He states he has been out of his Eliquis for several days which he believes is contributing to this.  He is requesting something for pain.  His syncopal episodes have been a recurrent issue and he was fairly recently hospitalized for this.  At that time, he did have new PEs and was restarted on his Eliquis.  He denies any hemoptysis.  Denies any significant head trauma.  Denies any focal numbness or weakness.     Physical Exam   Triage Vital Signs: ED Triage Vitals  Enc Vitals Group     BP      Pulse      Resp      Temp      Temp src      SpO2      Weight      Height      Head Circumference      Peak Flow      Pain Score      Pain Loc      Pain Edu?      Excl. in GC?     Most recent vital signs: Vitals:   11/02/21 0030 11/02/21 0045  BP: 126/82   Pulse: 85 83  Resp: 16 15  Temp:    SpO2: 95% 94%     General: Awake, no distress.  CV:  Good peripheral perfusion.  Regular rate and rhythm. Resp:  Normal effort.  Lungs clear to auscultation bilaterally. Abd:  No distention.  No tenderness. Other:  Cranials 2 through 12 intact.  Strength out of 5 bilateral upper and lower extremities.  Normal sensation to light touch.   ED Results / Procedures / Treatments   Labs (all labs ordered are listed, but only abnormal results are displayed) Labs Reviewed  CBC WITH DIFFERENTIAL/PLATELET - Abnormal; Notable for the following components:      Result Value   RBC 4.11 (*)     All other components within normal limits  BASIC METABOLIC PANEL - Abnormal; Notable for the following components:   Potassium 3.4 (*)    All other components within normal limits  TROPONIN I (HIGH SENSITIVITY)  TROPONIN I (HIGH SENSITIVITY)     EKG normal sinus rhythm, ventricular rate 89.  PR 162, QRS 141, QTc 46.  No acute ST elevations or depressions.  EKG evidence of acute ischemic infarct.    RADIOLOGY CT head: No acute intracranial abnormalities   I also independently reviewed and agree wit radiologist interpretations.   PROCEDURES:  Critical Care performed: None  .1-3 Lead EKG Interpretation Performed by: Shaune Pollack, MD Authorized by: Shaune Pollack, MD     Interpretation: normal     ECG rate:  70-90   ECG rate assessment: normal     Rhythm: sinus rhythm     Ectopy: none     Conduction: normal   Comments:     Indication: Chest pain    MEDICATIONS ORDERED IN ED: Medications  oxyCODONE-acetaminophen (PERCOCET/ROXICET)  5-325 MG per tablet 1 tablet (1 tablet Oral Given 11/01/21 2242)  apixaban (ELIQUIS) tablet 5 mg (5 mg Oral Given 11/01/21 2343)     IMPRESSION / MDM / ASSESSMENT AND PLAN / ED COURSE  I reviewed the triage vital signs and the nursing notes.                               The patient is on the cardiac monitor to evaluate for evidence of arrhythmia and/or significant heart rate changes.   MDM:  62 year old male with history of PE, hypertension, syncope, well-known to this ED with frequent visits for chest pain and syncope, here with chest pain.  Patient admits that his current chest pain is similar to his chronic issues, and he states he has not been taking his Eliquis.  He states he has a family member coming to help him get this within the next several days.  He also states he believes he may have passed out.  He was just hospitalized for syncope, which was thought related to his PEs.  He has had no arrhythmia or ectopy on telemetry  here.  CBC is unremarkable.  BMP reassuring with normal renal function.  Troponin negative despite constant chest pain for 12 hours, do not suspect ACS.  CT head obtained given his possible syncopal episode and fall, is negative.  Patient just had a CT angio, and is not tachycardic, hypoxic, with no evidence suggest significant increase in his underlying PE burden.  I discussed that many of his symptoms are related to his chronic PEs and that he needs to be adherent with his Eliquis.  He is given a dose here.  Otherwise, patient is at his baseline state of health, no apparent indication for admission at this time.   MEDICATIONS GIVEN IN ED: Medications  oxyCODONE-acetaminophen (PERCOCET/ROXICET) 5-325 MG per tablet 1 tablet (1 tablet Oral Given 11/01/21 2242)  apixaban (ELIQUIS) tablet 5 mg (5 mg Oral Given 11/01/21 2343)     Consults:     EMR reviewed  Extensive review of previous ED visits for similar complaints     FINAL CLINICAL IMPRESSION(S) / ED DIAGNOSES   Final diagnoses:  Chronic chest pain     Rx / DC Orders   ED Discharge Orders     None        Note:  This document was prepared using Dragon voice recognition software and may include unintentional dictation errors.   Shaune Pollack, MD 11/02/21 [phone]

## 2021-11-02 NOTE — ED Notes (Signed)
Pt presents to the ER via EMS for recurrent chest pain. Untreated PE. Pt unable to fill prescription for Eliquis. Stated that he has been vomiting, coffee ground emesis. Dizziness. States that he had a beer around 2000. Did cocaine 3 days. Ago. Stated that he fell twice today. Rates his pain 10/10 in his chest, lungs, and L arm and both legs are numb. A&Ox4. Skin p/w/d. RR even and nonlabored. RA.

## 2021-11-11 ENCOUNTER — Emergency Department: Payer: Medicaid Other

## 2021-11-11 ENCOUNTER — Other Ambulatory Visit: Payer: Self-pay

## 2021-11-11 ENCOUNTER — Encounter: Payer: Self-pay | Admitting: Emergency Medicine

## 2021-11-11 DIAGNOSIS — R0602 Shortness of breath: Secondary | ICD-10-CM | POA: Diagnosis not present

## 2021-11-11 DIAGNOSIS — R079 Chest pain, unspecified: Secondary | ICD-10-CM | POA: Diagnosis not present

## 2021-11-11 LAB — COMPREHENSIVE METABOLIC PANEL
ALT: 25 U/L (ref 0–44)
AST: 48 U/L — ABNORMAL HIGH (ref 15–41)
Albumin: 4.1 g/dL (ref 3.5–5.0)
Alkaline Phosphatase: 74 U/L (ref 38–126)
Anion gap: 8 (ref 5–15)
BUN: 7 mg/dL (ref 6–20)
CO2: 25 mmol/L (ref 22–32)
Calcium: 9.1 mg/dL (ref 8.9–10.3)
Chloride: 101 mmol/L (ref 98–111)
Creatinine, Ser: 0.82 mg/dL (ref 0.61–1.24)
GFR, Estimated: >60 mL/min (ref >60)
Glucose, Bld: 94 mg/dL (ref 70–99)
Potassium: 3.6 mmol/L (ref 3.5–5.1)
Sodium: 134 mmol/L — ABNORMAL LOW (ref 135–145)
Total Bilirubin: 0.5 mg/dL (ref 0.3–1.2)
Total Protein: 8.3 g/dL — ABNORMAL HIGH (ref 6.5–8.1)

## 2021-11-11 LAB — CBC
HCT: 42.9 % (ref 39.0–52.0)
Hemoglobin: 14.4 g/dL (ref 13.0–17.0)
MCH: 32.6 pg (ref 26.0–34.0)
MCHC: 33.6 g/dL (ref 30.0–36.0)
MCV: 97.1 fL (ref 80.0–100.0)
Platelets: 156 10*3/uL (ref 150–400)
RBC: 4.42 MIL/uL (ref 4.22–5.81)
RDW: 11.6 % (ref 11.5–15.5)
WBC: 7.6 10*3/uL (ref 4.0–10.5)
nRBC: 0 % (ref 0.0–0.2)

## 2021-11-11 LAB — TROPONIN I (HIGH SENSITIVITY): Troponin I (High Sensitivity): 8 ng/L (ref <18)

## 2021-11-11 NOTE — ED Triage Notes (Signed)
Pt to ED via POV with c/o continued chest pain, pt seen multiple times for same. Pt states is also out of his eliquis prescription. Pt states, "I haven't been here in over a week". Pt ambulatory with steady gait to triage, NAD noted at this time.

## 2021-11-12 ENCOUNTER — Emergency Department
Admission: EM | Admit: 2021-11-12 | Discharge: 2021-11-12 | Disposition: A | Discharge status: Home / Self Care | Payer: Medicaid Other | Attending: Emergency Medicine | Admitting: Emergency Medicine

## 2021-11-12 DIAGNOSIS — R079 Chest pain, unspecified: Secondary | ICD-10-CM

## 2021-11-12 MED ORDER — IBUPROFEN 400 MG PO TABS
400.0000 mg | ORAL_TABLET | Freq: Once | ORAL | Status: AC
  Administered 2021-11-12: 400 mg via ORAL
  Filled 2021-11-12: qty 1

## 2021-11-12 MED ORDER — APIXABAN 5 MG PO TABS
5.0000 mg | ORAL_TABLET | Freq: Once | ORAL | Status: AC
  Administered 2021-11-12: 5 mg via ORAL
  Filled 2021-11-12: qty 1

## 2021-11-12 MED ORDER — APIXABAN 5 MG PO TABS: 5.0000 mg | ORAL_TABLET | Freq: Two times a day (BID) | ORAL | 1 refills | Status: DC

## 2021-11-12 NOTE — ED Notes (Signed)
ED discharge information given to patient. Patient states "the doctor said I could sleep here for a while if I need to" RN informed pt that he has been discharged and he cannot stay sleeping in this room for the remainder of the night. Patient requesting to sleep in the hallway, also requesting soda x2 and a box sandwich meal.

## 2021-11-12 NOTE — Discharge Instructions (Signed)
Please seek medical attention for any high fevers, chest pain, shortness of breath, change in behavior, persistent vomiting, bloody stool or any other new or concerning symptoms.  

## 2021-11-12 NOTE — ED Provider Notes (Signed)
[facility] Provider Note    Event Date/Time   First MD Initiated Contact with Patient 11/12/21 0016     (approximate)   History   Chest Pain   HPI  Jose [name] is a 61 y.o. male  who, per hospital discharge summary dated 10/27/21 had recent admission for pulmonary embolism, presents to the emergency department today because of concern for continued chest pain as well as feeling like tonight is not a good night for him to be out on the streets. The patient states that he has run out of his eliquis so is no longer taking it. Has continued to have the chest pain he has been seen for in the past. Additionally having some shortness of breath that has been consistent since discharge as well. Furthermore he states he is homeless and that he feels he should not be outside again until it is light out. Says he is not allowed to go to the homeless shelters in [place].   Physical Exam   Triage Vital Signs: ED Triage Vitals  Enc Vitals Group     BP 11/11/21 2043 (!) 148/95     Pulse Rate 11/11/21 2043 88     Resp 11/11/21 2043 18     Temp 11/11/21 2043 97.8 F (36.6 C)     Temp Source 11/11/21 2043 Oral     SpO2 11/11/21 2043 100 %     Weight 11/11/21 2041 150 lb (68 kg)     Height 11/11/21 2041 5\' 8"  (1.727 m)     Head Circumference --      Peak Flow --      Pain Score 11/11/21 2041 10     Pain Loc --      Pain Edu? --      Excl. in GC? --     Most recent vital signs: Vitals:   11/11/21 2043  BP: (!) 148/95  Pulse: 88  Resp: 18  Temp: 97.8 F (36.6 C)  SpO2: 100%    General: Awake, no distress.  CV:  Good peripheral perfusion.  Resp:  Normal effort.  Abd:  No distention.     ED Results / Procedures / Treatments   Labs (all labs ordered are listed, but only abnormal results are displayed) Labs Reviewed  COMPREHENSIVE METABOLIC PANEL - Abnormal; Notable for the following components:      Result Value   Sodium 134 (*)    Total  Protein 8.3 (*)    AST 48 (*)    All other components within normal limits  CBC  TROPONIN I (HIGH SENSITIVITY)     EKG  I, 2044, attending physician, personally viewed and interpreted this EKG  EKG Time: 2046 Rate: 85 Rhythm: normal sinus rhythm Axis: normal Intervals: qtc 468 QRS: RBBB ST changes: no st elevation Impression: abnormal ekg   RADIOLOGY CXR I independently interpreted and visualized the cxr. My interpretation: no pneumonia, no pneumothorax Radiology interpretation:  IMPRESSION:  No active cardiopulmonary disease.      PROCEDURES:  Critical Care performed: No  Procedures   MEDICATIONS ORDERED IN ED: Medications - No data to display   IMPRESSION / MDM / ASSESSMENT AND PLAN / ED COURSE  I reviewed the triage vital signs and the nursing notes.                              Differential diagnosis includes, but is not limited  to, pe, acs, pneumonia, pneumothorax.  Patient presented with symptoms consistent with recent diagnosis of pulmonary embolism. Of note the patient has 3 times in the emergency department in the roughly 2 weeks since discharge from the hospital for the same symptoms. Blood work today without elevated troponin or wbc count. No anemia. CXR without concerning finding. Patient is not hypoxic or with tachypnea. At this time do think symptoms likely related to recent diagnosis of PE, but given lack of concerning vital signs and no elevation of troponin I don't think there has been significant change in burden, and thus while admission was considered given history of PE I do not think it is necessary at this time. Discussed with patient importance of starting back on eliquis. Of note the patient did request pain medication, offered tylenol or ibuprofen, patient stated that he cannot take tylenol but then stated he takes percocet. I educated patient on the fact that percocet contains tylenol. He chose to take ibuprofen.    FINAL  CLINICAL IMPRESSION(S) / ED DIAGNOSES   Final diagnoses:  Chest pain, unspecified type     Rx / DC Orders   ED Discharge Orders          Ordered    apixaban (ELIQUIS) 5 MG TABS tablet  2 times daily        11/12/21 0035             Note:  This document was prepared using Dragon voice recognition software and may include unintentional dictation errors.    Phineas Semen, MD 11/12/21 [phone]

## 2021-11-25 ENCOUNTER — Other Ambulatory Visit: Payer: Self-pay

## 2021-11-25 ENCOUNTER — Encounter: Payer: Self-pay | Admitting: Emergency Medicine

## 2021-11-25 ENCOUNTER — Emergency Department: Payer: Medicaid Other

## 2021-11-25 DIAGNOSIS — Z9114 Patient's other noncompliance with medication regimen: Secondary | ICD-10-CM | POA: Diagnosis not present

## 2021-11-25 DIAGNOSIS — R079 Chest pain, unspecified: Secondary | ICD-10-CM | POA: Diagnosis not present

## 2021-11-25 DIAGNOSIS — Z7901 Long term (current) use of anticoagulants: Secondary | ICD-10-CM | POA: Diagnosis not present

## 2021-11-25 DIAGNOSIS — Z79899 Other long term (current) drug therapy: Secondary | ICD-10-CM | POA: Insufficient documentation

## 2021-11-25 DIAGNOSIS — F1010 Alcohol abuse, uncomplicated: Secondary | ICD-10-CM | POA: Insufficient documentation

## 2021-11-25 DIAGNOSIS — I10 Essential (primary) hypertension: Secondary | ICD-10-CM | POA: Insufficient documentation

## 2021-11-25 DIAGNOSIS — G8929 Other chronic pain: Secondary | ICD-10-CM | POA: Diagnosis not present

## 2021-11-25 LAB — BASIC METABOLIC PANEL
Anion gap: 9 (ref 5–15)
BUN: 8 mg/dL (ref 6–20)
CO2: 24 mmol/L (ref 22–32)
Calcium: 9.9 mg/dL (ref 8.9–10.3)
Chloride: 101 mmol/L (ref 98–111)
Creatinine, Ser: 0.77 mg/dL (ref 0.61–1.24)
GFR, Estimated: >60 mL/min (ref >60)
Glucose, Bld: 109 mg/dL — ABNORMAL HIGH (ref 70–99)
Potassium: 3.6 mmol/L (ref 3.5–5.1)
Sodium: 134 mmol/L — ABNORMAL LOW (ref 135–145)

## 2021-11-25 LAB — CBC
HCT: 42.2 % (ref 39.0–52.0)
Hemoglobin: 14.3 g/dL (ref 13.0–17.0)
MCH: 32.4 pg (ref 26.0–34.0)
MCHC: 33.9 g/dL (ref 30.0–36.0)
MCV: 95.7 fL (ref 80.0–100.0)
Platelets: 158 10*3/uL (ref 150–400)
RBC: 4.41 MIL/uL (ref 4.22–5.81)
RDW: 11.9 % (ref 11.5–15.5)
WBC: 7.2 10*3/uL (ref 4.0–10.5)
nRBC: 0 % (ref 0.0–0.2)

## 2021-11-25 LAB — TROPONIN I (HIGH SENSITIVITY): Troponin I (High Sensitivity): 6 ng/L (ref <18)

## 2021-11-25 NOTE — ED Triage Notes (Signed)
First RN Note: Pt to ED via [facility] with c/o CP and SOB x 2-3 days. Per EMS also requesting something for pain. Pt seen for same multiple times before. Per EMS pt endorses ETOH use PTA. Per EMS pt with hx of blood clots in his lungs.  ? ? ?162/94 ?97% RA ?94HR NSR ?CBG 98 ?98.1 ? ?

## 2021-11-26 ENCOUNTER — Emergency Department
Admission: EM | Admit: 2021-11-26 | Discharge: 2021-11-26 | Disposition: A | Discharge status: Home / Self Care | Payer: Medicaid Other | Attending: Emergency Medicine | Admitting: Emergency Medicine

## 2021-11-26 DIAGNOSIS — Z9114 Patient's other noncompliance with medication regimen: Secondary | ICD-10-CM

## 2021-11-26 DIAGNOSIS — R079 Chest pain, unspecified: Secondary | ICD-10-CM

## 2021-11-26 DIAGNOSIS — F1010 Alcohol abuse, uncomplicated: Secondary | ICD-10-CM

## 2021-11-26 DIAGNOSIS — G8929 Other chronic pain: Secondary | ICD-10-CM

## 2021-11-26 LAB — TROPONIN I (HIGH SENSITIVITY): Troponin I (High Sensitivity): 7 ng/L (ref <18)

## 2021-11-26 MED ORDER — APIXABAN 5 MG PO TABS
5.0000 mg | ORAL_TABLET | Freq: Two times a day (BID) | ORAL | Status: DC
  Administered 2021-11-26: 5 mg via ORAL
  Filled 2021-11-26: qty 1

## 2021-11-26 MED ORDER — ACETAMINOPHEN 500 MG PO TABS
1000.0000 mg | ORAL_TABLET | Freq: Once | ORAL | Status: AC
  Administered 2021-11-26: 1000 mg via ORAL
  Filled 2021-11-26: qty 2

## 2021-11-26 MED ORDER — APIXABAN 5 MG PO TABS: 5.0000 mg | ORAL_TABLET | Freq: Two times a day (BID) | ORAL | 1 refills | Status: DC

## 2021-11-26 MED ORDER — AMLODIPINE BESYLATE 5 MG PO TABS: 5.0000 mg | ORAL_TABLET | Freq: Every day | ORAL | 1 refills | Status: DC

## 2021-11-26 NOTE — Discharge Instructions (Addendum)
You must start taking your Eliquis every day as prescribed and stop drinking.  We can not help you feel better unless you decide to take care of yourself and follow our recommendations. ? ?Your EKG, cardiac labs and chest xray showed no abnormalities today. ? ?Steps to find a Primary Care Provider (PCP): ? ?Call [phone] or [phone] to access "[facility] Find a Doctor Service." ? ?2.  You may also go on the [facility] website at [email or web address] ? ?

## 2021-11-26 NOTE — ED Provider Notes (Signed)
[facility] Provider Note    Event Date/Time   First MD Initiated Contact with Patient 11/26/21 [phone]     (approximate)   History   Chest Pain   HPI  Jose [name] is a 61 y.o. male with history of hypertension, PE, hepatitis C, substance abuse, medical noncompliance who presents to the emergency department with complaints of chest pain that is diffuse.  This is a chronic issue for patient and he states it is unchanged.  No shortness of breath currently, fevers, cough, lower extremity swelling or pain.  He is noncompliant with his Eliquis and states it has been more than 2 days since he has taken it last.   History provided by patient.    Past Medical History:  Diagnosis Date   Hep C w/o coma, chronic (HCC)    Hypertension    Polysubstance abuse (HCC)     Past Surgical History:  Procedure Laterality Date   ESOPHAGOGASTRODUODENOSCOPY (EGD) WITH PROPOFOL N/A 03/02/2019   Procedure: ESOPHAGOGASTRODUODENOSCOPY (EGD) WITH PROPOFOL;  Surgeon: Violeta Gelinas, MD;  Location: [facility] ENDOSCOPY;  Service: General;  Laterality: N/A;   IR GASTROSTOMY TUBE REMOVAL  05/04/2019   PEG PLACEMENT N/A 03/02/2019   Procedure: PERCUTANEOUS ENDOSCOPIC GASTROSTOMY (PEG) PLACEMENT;  Surgeon: Violeta Gelinas, MD;  Location: [facility] ENDOSCOPY;  Service: General;  Laterality: N/A;   PERCUTANEOUS TRACHEOSTOMY N/A 02/12/2019   Procedure: PERCUTANEOUS TRACHEOSTOMY;  Surgeon: Violeta Gelinas, MD;  Location: [facility] OR;  Service: General;  Laterality: N/A;    MEDICATIONS:  Prior to Admission medications   Medication Sig Start Date End Date Taking? Authorizing Provider  amLODipine (NORVASC) 5 MG tablet Take 1 tablet (5 mg total) by mouth daily. 10/11/21 11/10/21  [name], MD  apixaban (ELIQUIS) 5 MG TABS tablet Take 1 tablet (5 mg total) by mouth 2 (two) times daily. 11/12/21   Phineas Semen, MD  APIXABAN Everlene Balls) VTE STARTER PACK (10MG  AND 5MG ) Take as directed on package: start  with two-5mg  tablets twice daily for 7 days. On day 8, switch to one-5mg  tablet twice daily. 10/27/21   , MD  ibuprofen (ADVIL) 600 MG tablet Take 1 tablet (600 mg total) by mouth every 8 (eight) hours as needed for moderate pain. Patient not taking: Reported on 10/06/2021 09/10/21   10/08/2021, MD    Physical Exam   Triage Vital Signs: ED Triage Vitals  Enc Vitals Group     BP 11/25/21 2306 (!) 133/96     Pulse Rate 11/25/21 2306 96     Resp 11/25/21 2306 16     Temp 11/25/21 2306 98.3 F (36.8 C)     Temp Source 11/25/21 2306 Oral     SpO2 11/25/21 2306 95 %     Weight 11/25/21 2255 150 lb (68 kg)     Height 11/25/21 2255 5\' 8"  (1.727 m)     Head Circumference --      Peak Flow --      Pain Score 11/25/21 2255 10     Pain Loc --      Pain Edu? --      Excl. in GC? --     Most recent vital signs: Vitals:   11/26/21 0129 11/26/21 0355  BP: (!) 154/96 127/89  Pulse: 78 72  Resp: 13 14  Temp:    SpO2: 100% 99%    CONSTITUTIONAL: Alert and oriented and responds appropriately to questions. Well-appearing; well-nourished HEAD: Normocephalic, atraumatic EYES: Conjunctivae clear, pupils appear equal, sclera  nonicteric ENT: normal nose; moist mucous membranes NECK: Supple, normal ROM CARD: RRR; S1 and S2 appreciated; no murmurs, no clicks, no rubs, no gallops RESP: Normal chest excursion without splinting or tachypnea; breath sounds clear and equal bilaterally; no wheezes, no rhonchi, no rales, no hypoxia or respiratory distress, speaking full sentences ABD/GI: Normal bowel sounds; non-distended; soft, non-tender, no rebound, no guarding, no peritoneal signs BACK: The back appears normal EXT: Normal ROM in all joints; no deformity noted, no edema; no cyanosis SKIN: Normal color for age and race; warm; no rash on exposed skin NEURO: Moves all extremities equally, normal speech PSYCH: The patient's mood and manner are appropriate.   ED Results / Procedures /  Treatments   LABS: (all labs ordered are listed, but only abnormal results are displayed) Labs Reviewed  BASIC METABOLIC PANEL - Abnormal; Notable for the following components:      Result Value   Sodium 134 (*)    Glucose, Bld 109 (*)    All other components within normal limits  CBC  TROPONIN I (HIGH SENSITIVITY)  TROPONIN I (HIGH SENSITIVITY)     EKG:  EKG Interpretation  Date/Time:  Saturday November 25 2021 23:04:30 EST Ventricular Rate:  99 PR Interval:  134 QRS Duration: 140 QT Interval:  384 QTC Calculation: 492 R Axis:   66 Text Interpretation: Normal sinus rhythm Right bundle branch block Abnormal ECG When compared with ECG of 11-Nov-2021 20:46, No significant change was found Confirmed by Rochele Raring [phone]) on 11/26/2021 5:58:24 AM         RADIOLOGY: My personal review and interpretation of imaging: Chest x-ray shows no acute abnormality.  I have personally reviewed all radiology reports.   DG Chest 2 View  Result Date: 11/25/2021 CLINICAL DATA:  Chest pain and shortness of breath. EXAM: CHEST - 2 VIEW COMPARISON:  11/11/2021, chest CT 10/24/2021 FINDINGS: The cardiomediastinal contours are normal. The lungs are clear. Pulmonary vasculature is normal. No consolidation, pleural effusion, or pneumothorax. Remote left rib fractures. No acute osseous abnormalities are seen. IMPRESSION: No acute chest findings. Stable radiographic appearance of the chest. Electronically Signed   By: Narda Rutherford M.D.   On: 11/25/2021 23:23     PROCEDURES:  Critical Care performed: No   CRITICAL CARE Performed by: Baxter Hire Aailyah Dunbar   Total critical care time: 0 minutes  Critical care time was exclusive of separately billable procedures and treating other patients.  Critical care was necessary to treat or prevent imminent or life-threatening deterioration.  Critical care was time spent personally by me on the following activities: development of treatment plan with patient  and/or surrogate as well as nursing, discussions with consultants, evaluation of patient's response to treatment, examination of patient, obtaining history from patient or surrogate, ordering and performing treatments and interventions, ordering and review of laboratory studies, ordering and review of radiographic studies, pulse oximetry and re-evaluation of patient's condition.   Marland Kitchen1-3 Lead EKG Interpretation Performed by: Sherrill Mckamie, Layla Maw, DO Authorized by: Lashon Hillier, Layla Maw, DO     Interpretation: normal     ECG rate:  70   ECG rate assessment: normal     Rhythm: sinus rhythm     Ectopy: none     Conduction: normal      IMPRESSION / MDM / ASSESSMENT AND PLAN / ED COURSE  I reviewed the triage vital signs and the nursing notes.    Patient here with complaints of chronic chest pain.  Patient presents to the emergency department frequently  for the same.  Recently diagnosed with bilateral pulmonary emboli but is noncompliant with his Eliquis.  He has a history of alcohol abuse.  The patient is on the cardiac monitor to evaluate for evidence of arrhythmia and/or significant heart rate changes.   DIFFERENTIAL DIAGNOSIS (includes but not limited to):   Chronic chest pain, musculoskeletal pain, pain from previous PEs, low suspicion for ACS, dissection, pneumonia, pneumothorax, rib fractures   PLAN: We will obtain CBC, BMP, troponin x2, chest x-ray, EKG.  Will give Tylenol and a dose of his Eliquis.   MEDICATIONS GIVEN IN ED: Medications  apixaban (ELIQUIS) tablet 5 mg (5 mg Oral Given 11/26/21 0201)  acetaminophen (TYLENOL) tablet 1,000 mg (1,000 mg Oral Given 11/26/21 0201)     ED COURSE: Patient's labs have been reassuring.  Normal hemoglobin.  No leukocytosis.  Normal renal function and electrolytes.  Troponin x2 negative.  EKG shows a right bundle branch block that has been seen on previous EKGs and no other new ischemic change, arrhythmia.  No events noted on cardiac monitoring.  Chest  x-ray reviewed by myself and radiologist and shows no pneumothorax, infiltrate, edema, acute rib fracture.  Had a lengthy discussion with patient that he needs to abstain from alcohol and become compliant with his Eliquis.  We discussed that his continued alcohol abuse and medical noncompliance will likely lead to his death.  I do not feel he is in any imminent danger tonight and I think that this is a chronic issue for him but I explained that there is little we can do from the emergency department if he is not going to start taking care of himself and taking his medications as we have recommended multiple times.  I have low suspicion that he has any significantly clinical worsening of his PEs given he is hemodynamically stable and in no distress.  He does not have any increased work of breathing.  He has been able to ambulate in and out of the ED without any difficulty.  I have provided him with another prescription of his Eliquis.  We have had social work and pharmacy help set up outpatient help for the patient but he does not follow through instead he continues to drink alcohol.  Unfortunately we are not able to force this patient to make better decisions regarding his health.   At this time, I do not feel there is any life-threatening condition present. I reviewed all nursing notes, vitals, pertinent previous records.  All lab and urine results, EKGs, imaging ordered have been independently reviewed and interpreted by myself.  I reviewed all available radiology reports from any imaging ordered this visit.  Based on my assessment, I feel the patient is safe to be discharged home without further emergent workup and can continue workup as an outpatient as needed. Discussed all findings, treatment plan as well as usual and customary return precautions with patient.  They verbalize understanding and are comfortable with this plan.  Outpatient follow-up has been provided as needed.  All questions have been  answered.    CONSULTS: No admission at this time given reassuring work-up, patient hemodynamically stable.   OUTSIDE RECORDS REVIEWED: Reviewed patient's previous missions in January 2023 for PE.         FINAL CLINICAL IMPRESSION(S) / ED DIAGNOSES   Final diagnoses:  Chronic chest pain  H/O medication noncompliance  Alcohol abuse     Rx / DC Orders   ED Discharge Orders  Ordered    apixaban (ELIQUIS) 5 MG TABS tablet  2 times daily        11/26/21 0336    amLODipine (NORVASC) 5 MG tablet  Daily        11/26/21 0336             Note:  This document was prepared using Dragon voice recognition software and may include unintentional dictation errors.   Rhydian Baldi, Layla MawKristen N, DO 11/26/21 [phone]

## 2021-12-04 ENCOUNTER — Encounter: Payer: Self-pay | Admitting: Emergency Medicine

## 2021-12-04 ENCOUNTER — Other Ambulatory Visit: Payer: Self-pay

## 2021-12-04 ENCOUNTER — Emergency Department: Payer: Medicaid Other

## 2021-12-04 DIAGNOSIS — Z5900 Homelessness unspecified: Secondary | ICD-10-CM | POA: Insufficient documentation

## 2021-12-04 DIAGNOSIS — R079 Chest pain, unspecified: Secondary | ICD-10-CM | POA: Diagnosis present

## 2021-12-04 DIAGNOSIS — G8929 Other chronic pain: Secondary | ICD-10-CM | POA: Diagnosis not present

## 2021-12-04 DIAGNOSIS — Z79899 Other long term (current) drug therapy: Secondary | ICD-10-CM | POA: Diagnosis not present

## 2021-12-04 DIAGNOSIS — Z9114 Patient's other noncompliance with medication regimen: Secondary | ICD-10-CM | POA: Insufficient documentation

## 2021-12-04 DIAGNOSIS — Z7901 Long term (current) use of anticoagulants: Secondary | ICD-10-CM | POA: Insufficient documentation

## 2021-12-04 DIAGNOSIS — I10 Essential (primary) hypertension: Secondary | ICD-10-CM | POA: Insufficient documentation

## 2021-12-04 LAB — CBC
HCT: 42.9 % (ref 39.0–52.0)
Hemoglobin: 14.5 g/dL (ref 13.0–17.0)
MCH: 32.4 pg (ref 26.0–34.0)
MCHC: 33.8 g/dL (ref 30.0–36.0)
MCV: 96 fL (ref 80.0–100.0)
Platelets: 146 10*3/uL — ABNORMAL LOW (ref 150–400)
RBC: 4.47 MIL/uL (ref 4.22–5.81)
RDW: 11.8 % (ref 11.5–15.5)
WBC: 7.2 10*3/uL (ref 4.0–10.5)
nRBC: 0 % (ref 0.0–0.2)

## 2021-12-04 LAB — TROPONIN I (HIGH SENSITIVITY): Troponin I (High Sensitivity): 7 ng/L (ref <18)

## 2021-12-04 LAB — BASIC METABOLIC PANEL
Anion gap: 11 (ref 5–15)
BUN: 6 mg/dL (ref 6–20)
CO2: 27 mmol/L (ref 22–32)
Calcium: 9.8 mg/dL (ref 8.9–10.3)
Chloride: 99 mmol/L (ref 98–111)
Creatinine, Ser: 0.69 mg/dL (ref 0.61–1.24)
GFR, Estimated: >60 mL/min (ref >60)
Glucose, Bld: 91 mg/dL (ref 70–99)
Potassium: 3.9 mmol/L (ref 3.5–5.1)
Sodium: 137 mmol/L (ref 135–145)

## 2021-12-04 NOTE — ED Triage Notes (Signed)
Pt to ED via [facility], see first RN Note; pt c/o continued symptoms, seen multiples times for same, pt admits to 1 beer today.  ?

## 2021-12-04 NOTE — ED Triage Notes (Signed)
EMS brings pt to ED lobby, ambulatory without difficulty or distress noted; pt is homeless, and has been seen multiple times for same, noncompliant with meds; c/o prod cough  ?

## 2021-12-05 ENCOUNTER — Emergency Department
Admission: EM | Admit: 2021-12-05 | Discharge: 2021-12-05 | Disposition: A | Discharge status: Home / Self Care | Payer: Medicaid Other | Attending: Emergency Medicine | Admitting: Emergency Medicine

## 2021-12-05 DIAGNOSIS — Z91199 Patient's noncompliance with other medical treatment and regimen due to unspecified reason: Secondary | ICD-10-CM

## 2021-12-05 DIAGNOSIS — G8929 Other chronic pain: Secondary | ICD-10-CM

## 2021-12-05 DIAGNOSIS — Z5900 Homelessness unspecified: Secondary | ICD-10-CM

## 2021-12-05 MED ORDER — APIXABAN 5 MG PO TABS
5.0000 mg | ORAL_TABLET | Freq: Once | ORAL | Status: AC
  Administered 2021-12-05: 5 mg via ORAL
  Filled 2021-12-05: qty 1

## 2021-12-05 MED ORDER — ACETAMINOPHEN 500 MG PO TABS
1000.0000 mg | ORAL_TABLET | Freq: Once | ORAL | Status: AC
  Administered 2021-12-05: 1000 mg via ORAL
  Filled 2021-12-05: qty 2

## 2021-12-05 NOTE — ED Provider Notes (Signed)
[facility] Provider Note    Event Date/Time   First MD Initiated Contact with Patient 12/05/21 [phone]     (approximate)   History   Chest Pain   HPI  Jose [name] is a 61 y.o. male patient with history of hypertension, substance abuse, hepatitis C, PE with medication noncompliance who presents to the emergency department with complaints of central chest pain that he describes as a sharp pain that is chronic for him and unchanged.  He admits to noncompliance with his Eliquis but states he has a full prescription.  No fevers, cough, shortness of breath, lower extremity swelling or pain.  No change in his chronic pain.  Patient is homeless.   History provided by patient.    Past Medical History:  Diagnosis Date   Hep C w/o coma, chronic (HCC)    Hypertension    Polysubstance abuse (HCC)     Past Surgical History:  Procedure Laterality Date   ESOPHAGOGASTRODUODENOSCOPY (EGD) WITH PROPOFOL N/A 03/02/2019   Procedure: ESOPHAGOGASTRODUODENOSCOPY (EGD) WITH PROPOFOL;  Surgeon: Violeta Gelinas, MD;  Location: [facility] ENDOSCOPY;  Service: General;  Laterality: N/A;   IR GASTROSTOMY TUBE REMOVAL  05/04/2019   PEG PLACEMENT N/A 03/02/2019   Procedure: PERCUTANEOUS ENDOSCOPIC GASTROSTOMY (PEG) PLACEMENT;  Surgeon: Violeta Gelinas, MD;  Location: [facility] ENDOSCOPY;  Service: General;  Laterality: N/A;   PERCUTANEOUS TRACHEOSTOMY N/A 02/12/2019   Procedure: PERCUTANEOUS TRACHEOSTOMY;  Surgeon: Violeta Gelinas, MD;  Location: [facility] OR;  Service: General;  Laterality: N/A;    MEDICATIONS:  Prior to Admission medications   Medication Sig Start Date End Date Taking? Authorizing Provider  amLODipine (NORVASC) 5 MG tablet Take 1 tablet (5 mg total) by mouth daily. 11/26/21 12/26/21  Ferdinand Revoir, Layla Maw, DO  apixaban (ELIQUIS) 5 MG TABS tablet Take 1 tablet (5 mg total) by mouth 2 (two) times daily. 11/26/21   [place] Behlke, Layla Maw, DO    Physical Exam   Triage Vital Signs: ED Triage Vitals  [12/04/21 2302]  Enc Vitals Group     BP (!) 134/94     Pulse Rate 91     Resp 16     Temp 98.1 F (36.7 C)     Temp Source Oral     SpO2 96 %     Weight 149 lb 14.6 oz (68 kg)     Height 5\' 8"  (1.727 m)     Head Circumference      Peak Flow      Pain Score 10     Pain Loc      Pain Edu?      Excl. in GC?     Most recent vital signs: Vitals:   12/04/21 2302 12/05/21 0227  BP: (!) 134/94 (!) 142/88  Pulse: 91 90  Resp: 16 16  Temp: 98.1 F (36.7 C) 98.2 F (36.8 C)  SpO2: 96% 99%    CONSTITUTIONAL: Alert and oriented and responds appropriately to questions. Well-appearing; well-nourished HEAD: Normocephalic, atraumatic EYES: Conjunctivae clear, pupils appear equal, sclera nonicteric ENT: normal nose; moist mucous membranes NECK: Supple, normal ROM CARD: RRR; S1 and S2 appreciated; no murmurs, no clicks, no rubs, no gallops RESP: Normal chest excursion without splinting or tachypnea; breath sounds clear and equal bilaterally; no wheezes, no rhonchi, no rales, no hypoxia or respiratory distress, speaking full sentences ABD/GI: Normal bowel sounds; non-distended; soft, non-tender, no rebound, no guarding, no peritoneal signs BACK: The back appears normal EXT: Normal ROM in all joints; no deformity  noted, no edema; no cyanosis, no calf tenderness or calf swelling SKIN: Normal color for age and race; warm; no rash on exposed skin NEURO: Moves all extremities equally, normal speech PSYCH: The patient's mood and manner are appropriate.   ED Results / Procedures / Treatments   LABS: (all labs ordered are listed, but only abnormal results are displayed) Labs Reviewed  CBC - Abnormal; Notable for the following components:      Result Value   Platelets 146 (*)    All other components within normal limits  BASIC METABOLIC PANEL  TROPONIN I (HIGH SENSITIVITY)     EKG:  EKG Interpretation  Date/Time:  Monday December 04 2021 23:04:50 EDT Ventricular Rate:  95 PR  Interval:  150 QRS Duration: 134 QT Interval:  404 QTC Calculation: 507 R Axis:   54 Text Interpretation: Normal sinus rhythm Right bundle branch block Abnormal ECG When compared with ECG of 25-Nov-2021 23:04, No significant change was found Confirmed by Rochele Raring [phone]) on 12/05/2021 1:43:58 AM         RADIOLOGY: My personal review and interpretation of imaging: Chest x-ray clear.  I have personally reviewed all radiology reports.   DG Chest 2 View  Result Date: 12/04/2021 CLINICAL DATA:  Cough/CP EXAM: CHEST - 2 VIEW COMPARISON:  Chest x-ray 11/25/2021, CT chest 10/24/2021 FINDINGS: The heart and mediastinal contours are unchanged. Aortic calcification. Known biapical emphysematous changes poorly visualized. No focal consolidation. No pulmonary edema. No pleural effusion. No pneumothorax. No acute osseous abnormality. IMPRESSION: No active cardiopulmonary disease. Aortic Atherosclerosis (ICD10-I70.0) and Emphysema (ICD10-J43.9). Electronically Signed   By: Tish Frederickson M.D.   On: 12/04/2021 23:33     PROCEDURES:  Critical Care performed: No   CRITICAL CARE Performed by: Baxter Hire Daeton Kluth   Total critical care time: 0 minutes  Critical care time was exclusive of separately billable procedures and treating other patients.  Critical care was necessary to treat or prevent imminent or life-threatening deterioration.  Critical care was time spent personally by me on the following activities: development of treatment plan with patient and/or surrogate as well as nursing, discussions with consultants, evaluation of patient's response to treatment, examination of patient, obtaining history from patient or surrogate, ordering and performing treatments and interventions, ordering and review of laboratory studies, ordering and review of radiographic studies, pulse oximetry and re-evaluation of patient's condition.   Procedures    IMPRESSION / MDM / ASSESSMENT AND PLAN / ED COURSE   I reviewed the triage vital signs and the nursing notes.    Patient here with atypical, chronic chest pain.  History of homelessness.  Frequent visits to the emergency department for the same.  The patient is on the cardiac monitor to evaluate for evidence of arrhythmia and/or significant heart rate changes.   DIFFERENTIAL DIAGNOSIS (includes but not limited to):   Chronic chest pain.  Doubt ACS, worsening PE, dissection, pneumothorax, pneumonia, CHF.   PLAN: We will obtain 1 set of cardiac labs, CBC, BMP, EKG and chest x-ray.  Will give Tylenol and a dose of his home Eliquis.   MEDICATIONS GIVEN IN ED: Medications  acetaminophen (TYLENOL) tablet 1,000 mg (1,000 mg Oral Given 12/05/21 0059)  apixaban (ELIQUIS) tablet 5 mg (5 mg Oral Given 12/05/21 0059)     ED COURSE: Patient's labs reassuring.  Normal hemoglobin, electrolytes, renal function and negative troponin.  Chest x-ray reviewed by myself and radiologist and shows no infiltrate, edema, pneumothorax or other acute abnormality.  EKG shows right bundle  branch block which is unchanged compared to previous with no new ischemic abnormality.  He remains clinically stable, hemodynamically stable and appears comfortable.  I feel he is safe for discharge.  Have once again encouraged him to take his Eliquis as prescribed.  He states he has plenty of this medication but states that it is currently in the "woods".  Patient is homeless.  I have given him outpatient homeless shelter resources.   At this time, I do not feel there is any life-threatening condition present. I reviewed all nursing notes, vitals, pertinent previous records.  All lab and urine results, EKGs, imaging ordered have been independently reviewed and interpreted by myself.  I reviewed all available radiology reports from any imaging ordered this visit.  Based on my assessment, I feel the patient is safe to be discharged home without further emergent workup and can continue  workup as an outpatient as needed. Discussed all findings, treatment plan as well as usual and customary return precautions with patient.  They verbalize understanding and are comfortable with this plan.  Outpatient follow-up has been provided as needed.  All questions have been answered.    CONSULTS: No hospitalization needed.  Patient does have risk factors for ACS and has history of PE but is stable today with reassuring work-up and presents frequently to the emergency department for the exact same symptoms.   OUTSIDE RECORDS REVIEWED: Reviewed patient's last admission to the hospital on 10/24/2021.         FINAL CLINICAL IMPRESSION(S) / ED DIAGNOSES   Final diagnoses:  Chronic chest pain  Homelessness  Medically noncompliant     Rx / DC Orders   ED Discharge Orders     None        Note:  This document was prepared using Dragon voice recognition software and may include unintentional dictation errors.   Severina Sykora, Layla Maw, DO 12/05/21 [phone]

## 2021-12-05 NOTE — Discharge Instructions (Signed)
You may take Tylenol 1000 mg every 6 hours as needed for pain. ? ?Please begin taking your Eliquis as prescribed every day. ? ? ?Steps to find a Primary Care Provider (PCP): ? ?Call [phone] or [phone] to access "[place] Find a Doctor Service." ? ?2.  You may also go on the [facility] website at [email or web address] ? ?

## 2021-12-05 NOTE — ED Notes (Signed)
Pt requesting sandwich boxed lunch, a pillow, and a couple of sodas. ?

## 2021-12-30 ENCOUNTER — Other Ambulatory Visit: Payer: Self-pay

## 2021-12-30 ENCOUNTER — Emergency Department: Payer: Medicaid Other

## 2021-12-30 ENCOUNTER — Emergency Department
Admission: EM | Admit: 2021-12-30 | Discharge: 2021-12-31 | Disposition: A | Discharge status: Home / Self Care | Payer: Medicaid Other | Attending: Emergency Medicine | Admitting: Emergency Medicine

## 2021-12-30 DIAGNOSIS — I10 Essential (primary) hypertension: Secondary | ICD-10-CM | POA: Insufficient documentation

## 2021-12-30 DIAGNOSIS — E876 Hypokalemia: Secondary | ICD-10-CM | POA: Insufficient documentation

## 2021-12-30 DIAGNOSIS — R0602 Shortness of breath: Secondary | ICD-10-CM | POA: Diagnosis not present

## 2021-12-30 DIAGNOSIS — Z5900 Homelessness unspecified: Secondary | ICD-10-CM | POA: Insufficient documentation

## 2021-12-30 DIAGNOSIS — R079 Chest pain, unspecified: Secondary | ICD-10-CM | POA: Diagnosis present

## 2021-12-30 DIAGNOSIS — R0789 Other chest pain: Secondary | ICD-10-CM | POA: Diagnosis not present

## 2021-12-30 DIAGNOSIS — Z7901 Long term (current) use of anticoagulants: Secondary | ICD-10-CM | POA: Insufficient documentation

## 2021-12-30 DIAGNOSIS — G8929 Other chronic pain: Secondary | ICD-10-CM | POA: Diagnosis not present

## 2021-12-30 DIAGNOSIS — Y906 Blood alcohol level of 120-199 mg/100 ml: Secondary | ICD-10-CM | POA: Diagnosis not present

## 2021-12-30 DIAGNOSIS — F10129 Alcohol abuse with intoxication, unspecified: Secondary | ICD-10-CM | POA: Insufficient documentation

## 2021-12-30 LAB — CBC
HCT: 40.2 % (ref 39.0–52.0)
Hemoglobin: 13.7 g/dL (ref 13.0–17.0)
MCH: 32.2 pg (ref 26.0–34.0)
MCHC: 34.1 g/dL (ref 30.0–36.0)
MCV: 94.6 fL (ref 80.0–100.0)
Platelets: 143 10*3/uL — ABNORMAL LOW (ref 150–400)
RBC: 4.25 MIL/uL (ref 4.22–5.81)
RDW: 12.2 % (ref 11.5–15.5)
WBC: 8.8 10*3/uL (ref 4.0–10.5)
nRBC: 0 % (ref 0.0–0.2)

## 2021-12-30 LAB — BASIC METABOLIC PANEL
Anion gap: 14 (ref 5–15)
BUN: 9 mg/dL (ref 6–20)
CO2: 23 mmol/L (ref 22–32)
Calcium: 9.2 mg/dL (ref 8.9–10.3)
Chloride: 98 mmol/L (ref 98–111)
Creatinine, Ser: 0.64 mg/dL (ref 0.61–1.24)
GFR, Estimated: >60 mL/min (ref >60)
Glucose, Bld: 154 mg/dL — ABNORMAL HIGH (ref 70–99)
Potassium: 2.9 mmol/L — ABNORMAL LOW (ref 3.5–5.1)
Sodium: 135 mmol/L (ref 135–145)

## 2021-12-30 LAB — PROTIME-INR
INR: 0.9 (ref 0.8–1.2)
Prothrombin Time: 12.3 seconds (ref 11.4–15.2)

## 2021-12-30 LAB — LACTIC ACID, PLASMA: Lactic Acid, Venous: 4.5 mmol/L (ref 0.5–1.9)

## 2021-12-30 LAB — ETHANOL: Alcohol, Ethyl (B): 172 mg/dL — ABNORMAL HIGH (ref <10)

## 2021-12-30 LAB — TROPONIN I (HIGH SENSITIVITY): Troponin I (High Sensitivity): 12 ng/L (ref <18)

## 2021-12-30 MED ORDER — IOHEXOL 350 MG/ML SOLN
75.0000 mL | Freq: Once | INTRAVENOUS | Status: AC | PRN
  Administered 2021-12-30: 75 mL via INTRAVENOUS

## 2021-12-30 MED ORDER — MORPHINE SULFATE (PF) 4 MG/ML IV SOLN
4.0000 mg | Freq: Once | INTRAVENOUS | Status: AC
  Administered 2021-12-31: 4 mg via INTRAVENOUS
  Filled 2021-12-30: qty 1

## 2021-12-30 MED ORDER — POTASSIUM CHLORIDE 10 MEQ/100ML IV SOLN
10.0000 meq | Freq: Once | INTRAVENOUS | Status: AC
  Administered 2021-12-31: 10 meq via INTRAVENOUS
  Filled 2021-12-30: qty 100

## 2021-12-30 MED ORDER — POTASSIUM CHLORIDE CRYS ER 20 MEQ PO TBCR
40.0000 meq | EXTENDED_RELEASE_TABLET | Freq: Once | ORAL | Status: AC
  Administered 2021-12-30: 40 meq via ORAL
  Filled 2021-12-30: qty 2

## 2021-12-30 MED ORDER — LACTATED RINGERS IV BOLUS
1000.0000 mL | Freq: Once | INTRAVENOUS | Status: AC
  Administered 2021-12-31: 1000 mL via INTRAVENOUS

## 2021-12-30 NOTE — ED Triage Notes (Signed)
FIRST NURSE NOTE:   Pt here via [facility] with reports of chest pain,  18 LFA IV, 500cc LR and 500 NS CBG 278, 3 sprays NTG. ? ?Pt NSR with RBBB on EKG.   ? ?170/100 BP with EMS.   ? ?C/o stabbing chest pain.  324 ASA given by EMS ?

## 2021-12-30 NOTE — ED Provider Notes (Signed)
? ?[facility] ?Provider Note ? ? ? Event Date/Time  ? First MD Initiated Contact with Patient 12/30/21 2308   ?  (approximate) ? ? ?History  ? ?Chest Pain, Shortness of Breath, and Weakness ? ? ?HPI ? ?Jose RueJames Patrick Toney is a 61 y.o. male who presents to the ED for evaluation of Chest Pain, Shortness of Breath, and Weakness ?  ?Reviewed recurrent ED visits for chronic chest pain and malingering. ?Last medical DC summary from 2/3.  History of PE on Eliquis.  Polysubstance abuse, hypertension.  Homeless. ? ?Patient presents to the ED for evaluation of acute on chronic pain and being cold.  He reports he has not taken his Eliquis in about a week because he cannot afford it.  Reports it is about $4 per month.  ? ?Reports acute on chronic generalized chest pain "for a while."  Denies any syncope, fever or hematemesis or hemoptysis.  Reports he has not eaten in 3 to 4 days and has only had 1 beer today.  He is requesting food and admission overnight because it is cold outside. ? ? ?Physical Exam  ? ?Triage Vital Signs: ?ED Triage Vitals  ?Enc Vitals Group  ?   BP 12/30/21 2130 106/60  ?   Pulse Rate 12/30/21 2130 (!) 108  ?   Resp 12/30/21 2130 20  ?   Temp 12/30/21 2130 99.1 ?F (37.3 ?C)  ?   Temp Source 12/30/21 2130 Oral  ?   SpO2 12/30/21 2130 95 %  ?   Weight 12/30/21 2135 140 lb (63.5 kg)  ?   Height 12/30/21 2135 5\' 8"  (1.727 m)  ?   Head Circumference --   ?   Peak Flow --   ?   Pain Score 12/30/21 2135 10  ?   Pain Loc --   ?   Pain Edu? --   ?   Excl. in GC? --   ? ? ?Most recent vital signs: ?Vitals:  ? 12/31/21 0220 12/31/21 0300  ?BP: 117/82 136/75  ?Pulse: 93 76  ?Resp: 18 16  ?Temp:    ?SpO2: 96% 99%  ? ? ?General: Awake, no distress.  Asleep when I enter the room.  Awakens easily to vocal stimulation and subsequently conversational. ?CV:  Good peripheral perfusion. RRR ?Resp:  Normal effort. CTAB ?Abd:  No distention.  Soft and benign ?MSK:  No deformity noted.  No significant  edema, signs of trauma or deformity ?Neuro:  No focal deficits appreciated. Cranial nerves II through XII intact ?5/5 strength and sensation in all 4 extremities ?Other:   ? ? ?ED Results / Procedures / Treatments  ? ?Labs ?(all labs ordered are listed, but only abnormal results are displayed) ?Labs Reviewed  ?BASIC METABOLIC PANEL - Abnormal; Notable for the following components:  ?    Result Value  ? Potassium 2.9 (*)   ? Glucose, Bld 154 (*)   ? All other components within normal limits  ?CBC - Abnormal; Notable for the following components:  ? Platelets 143 (*)   ? All other components within normal limits  ?ETHANOL - Abnormal; Notable for the following components:  ? Alcohol, Ethyl (B) 172 (*)   ? All other components within normal limits  ?LACTIC ACID, PLASMA - Abnormal; Notable for the following components:  ? Lactic Acid, Venous 4.5 (*)   ? All other components within normal limits  ?LACTIC ACID, PLASMA - Abnormal; Notable for the following components:  ? Lactic Acid,  Venous 4.1 (*)   ? All other components within normal limits  ?URINALYSIS, ROUTINE W REFLEX MICROSCOPIC - Abnormal; Notable for the following components:  ? Color, Urine STRAW (*)   ? APPearance CLEAR (*)   ? All other components within normal limits  ?LACTIC ACID, PLASMA - Abnormal; Notable for the following components:  ? Lactic Acid, Venous 3.3 (*)   ? All other components within normal limits  ?PROTIME-INR  ?TROPONIN I (HIGH SENSITIVITY)  ?TROPONIN I (HIGH SENSITIVITY)  ? ? ?EKG ?Sinus tachycardia, rate of 107 bpm.  Normal axis.  Right bundle.  No evidence of acute ischemia. ?Similar to previous from 3/13. ? ?RADIOLOGY ?CXR reviewed by me without evidence of acute cardiopulmonary pathology. ? ?Official radiology report(s): ?DG Chest 2 View ? ?Result Date: 12/30/2021 ?CLINICAL DATA:  Shortness of breath and weakness. EXAM: CHEST - 2 VIEW COMPARISON:  December 04, 2021 FINDINGS: The heart size and mediastinal contours are within normal limits.  Both lungs are clear. Multiple chronic left-sided rib fractures are seen. IMPRESSION: No active cardiopulmonary disease. Electronically Signed   By: Aram Candela M.D.   On: 12/30/2021 22:11  ? ?CT Angio Chest PE W and/or Wo Contrast ? ?Result Date: 12/30/2021 ?CLINICAL DATA:  History of PE, increased chest pain EXAM: CT ANGIOGRAPHY CHEST WITH CONTRAST TECHNIQUE: Multidetector CT imaging of the chest was performed using the standard protocol during bolus administration of intravenous contrast. Multiplanar CT image reconstructions and MIPs were obtained to evaluate the vascular anatomy. RADIATION DOSE REDUCTION: This exam was performed according to the departmental dose-optimization program which includes automated exposure control, adjustment of the mA and/or kV according to patient size and/or use of iterative reconstruction technique. CONTRAST:  52mL OMNIPAQUE IOHEXOL 350 MG/ML SOLN COMPARISON:  Chest x-ray 12/30/2021, CT chest 10/24/2021, CT chest 01/23/2019 FINDINGS: Cardiovascular: Satisfactory opacification of the pulmonary arteries to the segmental level. No evidence of pulmonary embolism. Nonaneurysmal aorta. Mild atherosclerosis. Normal cardiac size. No pericardial effusion. Mediastinum/Nodes: Midline trachea. No suspicious thyroid nodule. No suspicious adenopathy. Esophagus within normal limits. Lungs/Pleura: Emphysema. No acute airspace disease, pleural effusion, or pneumothorax. No suspicious lung nodules. Stable punctate 2 mm right upper lobe posterior lung nodule, felt benign, no follow-up imaging recommended. Upper Abdomen: No acute abnormality. Musculoskeletal: No chest wall abnormality. No acute or significant osseous findings. Review of the MIP images confirms the above findings. IMPRESSION: 1. Negative for acute pulmonary embolus. 2. Emphysema without acute airspace disease Aortic Atherosclerosis (ICD10-I70.0) and Emphysema (ICD10-J43.9). Electronically Signed   By: Jasmine Pang M.D.   On:  12/30/2021 23:55   ? ?PROCEDURES and INTERVENTIONS: ? ?.1-3 Lead EKG Interpretation ?Performed by: Delton Prairie, MD ?Authorized by: Delton Prairie, MD  ? ?  Interpretation: normal   ?  ECG rate:  90 ?  ECG rate assessment: normal   ?  Rhythm: sinus rhythm   ?  Ectopy: none   ?  Conduction: normal   ? ?Medications  ?lactated ringers bolus 1,000 mL (0 mLs Intravenous Stopped 12/31/21 0127)  ?potassium chloride SA (KLOR-CON M) CR tablet 40 mEq (40 mEq Oral Given 12/30/21 2359)  ?morphine (PF) 4 MG/ML injection 4 mg (4 mg Intravenous Given 12/31/21 0002)  ?potassium chloride 10 mEq in 100 mL IVPB (0 mEq Intravenous Stopped 12/31/21 0127)  ?iohexol (OMNIPAQUE) 350 MG/ML injection 75 mL (75 mLs Intravenous Contrast Given 12/30/21 2335)  ?apixaban (ELIQUIS) tablet 5 mg (5 mg Oral Given 12/31/21 0043)  ?lactated ringers bolus 1,000 mL (0 mLs Intravenous Stopped 12/31/21 0328)  ?  acetaminophen (TYLENOL) tablet 1,000 mg (1,000 mg Oral Given 12/31/21 0238)  ?methocarbamol (ROBAXIN) tablet 500 mg (500 mg Oral Given 12/31/21 0238)  ? ? ? ?IMPRESSION / MDM / ASSESSMENT AND PLAN / ED COURSE  ?I reviewed the triage vital signs and the nursing notes. ? ?61 year old male with previous history of PE and recurrent ED visits for chest pain presents to the ED with acute chest pain and without evidence of acute pathology.  Ultimately suitable for outpatient management.  He looks systemically well to me without evidence of neurologic or vascular deficits.  EKG is nonischemic and 2 high-sensitivity troponins are negative.  Blood work with hypokalemia that was repleted orally and IV.  Normal CBC.  Is noted to have mild ethanol intoxication.  Lactic acid is elevated, possibly due to poor oral intake and alcohol binge.  No evidence of withdrawal from ethanol.  With food and IV fluids his lactic acid is improving.  Pain is improving.  Due to his poor compliance with Eliquis, CTA chest obtained and without evidence of increased clot burden, or any signs of PE.  I  see no barriers to outpatient management at this time.  We discussed appropriate return precautions for the ED. ? ?Clinical Course as of 12/31/21 0418  ?Wynelle Link Dec 31, 2021  ?0016 Reassessed.  Reports his pain is a l

## 2021-12-30 NOTE — ED Notes (Signed)
Pt visualized ambulating from the bathroom.  ?

## 2021-12-30 NOTE — ED Triage Notes (Signed)
Pt also reports feeling short of breath with weakness in the bilateral legs. Reports drinking a beer tonight. Reports bright red blood in his vomit.  ?

## 2021-12-31 LAB — URINALYSIS, ROUTINE W REFLEX MICROSCOPIC
Bilirubin Urine: NEGATIVE
Glucose, UA: NEGATIVE mg/dL
Hgb urine dipstick: NEGATIVE
Ketones, ur: NEGATIVE mg/dL
Leukocytes,Ua: NEGATIVE
Nitrite: NEGATIVE
Protein, ur: NEGATIVE mg/dL
Specific Gravity, Urine: 1.012 (ref 1.005–1.030)
pH: 6 (ref 5.0–8.0)

## 2021-12-31 LAB — LACTIC ACID, PLASMA
Lactic Acid, Venous: 4.1 mmol/L (ref 0.5–1.9)
Lactic Acid, Venous: 3.3 mmol/L (ref 0.5–1.9)

## 2021-12-31 LAB — TROPONIN I (HIGH SENSITIVITY): Troponin I (High Sensitivity): 14 ng/L (ref <18)

## 2021-12-31 MED ORDER — ACETAMINOPHEN 500 MG PO TABS
1000.0000 mg | ORAL_TABLET | Freq: Once | ORAL | Status: AC
Start: 2021-12-31 — End: 2021-12-31
  Administered 2021-12-31: 1000 mg via ORAL
  Filled 2021-12-31: qty 2

## 2021-12-31 MED ORDER — APIXABAN 5 MG PO TABS
5.0000 mg | ORAL_TABLET | Freq: Once | ORAL | Status: AC
  Administered 2021-12-31: 5 mg via ORAL
  Filled 2021-12-31: qty 1

## 2021-12-31 MED ORDER — LACTATED RINGERS IV BOLUS
1000.0000 mL | Freq: Once | INTRAVENOUS | Status: AC
  Administered 2021-12-31: 1000 mL via INTRAVENOUS

## 2021-12-31 MED ORDER — METHOCARBAMOL 500 MG PO TABS
500.0000 mg | ORAL_TABLET | Freq: Once | ORAL | Status: AC
  Administered 2021-12-31: 500 mg via ORAL
  Filled 2021-12-31: qty 1

## 2021-12-31 NOTE — ED Notes (Signed)
Pt was given sandwich tray and sprite ?

## 2021-12-31 NOTE — ED Notes (Signed)
Pt c/o chest pain associated with SOB that started around 3-4 months ago. Pt states that he has been recently diagnosed with pulmonary embolism in his lungs. Pt states he has not been compliant with his Eliquis.  ?

## 2022-01-18 NOTE — Congregational Nurse Program (Signed)
?  Dept: [phone] ? ? ?Congregational Nurse Program Note ? ?Date of Encounter: 01/18/2022 ?Client to clinic for first aid to scrapes on his head. Reports he fell recently.  ?BP 140/78 (BP Location: Left Arm, Patient Position: Sitting, Cuff Size: Normal)   Pulse (!) 110   SpO2 95%  ?Client does not currently have a PCP, he reports having an MD several years ago. RN to assist with finding a new PCP as client does have Medicaid. ?Past Medical History: ?Past Medical History:  ?Diagnosis Date  ? Hep C w/o coma, chronic (HCC)   ? Hypertension   ? Polysubstance abuse ([place])   ? ? ?Encounter Details: ? CNP Questionnaire - 01/18/22 1132   ? ?  ? Questionnaire  ? Do you give verbal consent to treat you today? Yes   ? Location Patient [place]   ? Visit Setting Church or Organization   ? Patient Status Homeless   ? Insurance Medicaid   ? Insurance Referral N/A   ? Medication N/A   does not currenlty have any prescriptions  ? Medical Provider No   ? Screening Referrals N/A   ? Medical Referral N/A   Client has medicaid but does not have a PCP. RN to assist with finding MD  ? Medical Appointment Made N/A   [place] does not have a PCP, RN to assist with finding an Md  ? Food Have Food Insecurities   ? Transportation Need transportation assistance   ? Housing/Utilities No permanent housing   ? Interpersonal Safety N/A   ? Intervention Blood pressure;Support;Case Management   ? ED Visit Averted N/A   ? Life-Saving Intervention Made N/A   ? ?  ?  ? ?  ? ? ? ? ?

## 2022-02-06 NOTE — Congregational Nurse Program (Signed)
?  Dept: [phone] ? ? ?Congregational Nurse Program Note ? ?Date of Encounter: 02/06/2022 ?Client to clinic with request for blood pressure check. ?BP (!) 150/90 (BP Location: Left Arm, Patient Position: Sitting, Cuff Size: Normal)   Pulse (!) 101   SpO2 92%  ?RN to work with client to get a medical appointment. ? ?Past Medical History: ?Past Medical History:  ?Diagnosis Date  ? Hep C w/o coma, chronic (HCC)   ? Hypertension   ? Polysubstance abuse (HCC)   ? ? ?Encounter Details: ? CNP Questionnaire - 02/06/22 1120   ? ?  ? Questionnaire  ? Do you give verbal consent to treat you today? Yes   ? Location Patient Served  [name]   ? Visit Setting Church or Organization   ? Patient Status Homeless   ? Insurance Medicaid   ? Insurance Referral N/A   ? Medication N/A   ? Medical Provider No   ? Screening Referrals N/A   ? Medical Referral N/A   ? Medical Appointment Made N/A   ? Food N/A   ? Transportation Need transportation assistance   ? Intervention Support   ? ?  ?  ? ?  ? ? ? ? ?

## 2022-02-13 NOTE — Congregational Nurse Program (Signed)
  Dept: [phone]   Congregational Nurse Program Note  Date of Encounter: 02/13/2022 Client to clinic for blood pressure check and for assistance in finding a medical provider. Writer called [employer], the company on his Medicaid card for assistance in finding a PCP in his plan. Writer to continue to assist client in making an appointment. Client to return to clinic tomorrow for information. Past Medical History: Past Medical History:  Diagnosis Date   Hep C w/o coma, chronic (HCC)    Hypertension    Polysubstance abuse (HCC)     Encounter Details:  CNP Questionnaire - 02/13/22 1320       Questionnaire   Do you give verbal consent to treat you today? Yes    Location Patient Served  [name]    Visit Setting [facility] or Organization    Patient Status Homeless    Insurance Medicaid   [facility] helath plan   Insurance Referral N/A    Medication N/A    Medical Provider No    Screening Referrals N/A    Medical Referral N/A   RN assisting client in finding a PCP   Medical Appointment Made N/A    Food Have Food Insecurities    Transportation Need transportation assistance    Housing/Utilities No permanent housing    Interpersonal Safety N/A    Intervention Support;Blood pressure;Educate;Case Management    ED Visit Averted N/A    Life-Saving Intervention Made N/A

## 2022-02-20 ENCOUNTER — Encounter: Payer: Self-pay | Admitting: *Deleted

## 2022-02-20 ENCOUNTER — Other Ambulatory Visit: Payer: Self-pay

## 2022-02-20 DIAGNOSIS — Y908 Blood alcohol level of 240 mg/100 ml or more: Secondary | ICD-10-CM | POA: Diagnosis not present

## 2022-02-20 DIAGNOSIS — F1010 Alcohol abuse, uncomplicated: Secondary | ICD-10-CM | POA: Diagnosis present

## 2022-02-20 DIAGNOSIS — I10 Essential (primary) hypertension: Secondary | ICD-10-CM | POA: Diagnosis not present

## 2022-02-20 DIAGNOSIS — Z5900 Homelessness unspecified: Secondary | ICD-10-CM | POA: Insufficient documentation

## 2022-02-20 LAB — BASIC METABOLIC PANEL
Anion gap: 11 (ref 5–15)
BUN: 7 mg/dL (ref 6–20)
CO2: 25 mmol/L (ref 22–32)
Calcium: 9.7 mg/dL (ref 8.9–10.3)
Chloride: 104 mmol/L (ref 98–111)
Creatinine, Ser: 0.74 mg/dL (ref 0.61–1.24)
GFR, Estimated: >60 mL/min (ref >60)
Glucose, Bld: 102 mg/dL — ABNORMAL HIGH (ref 70–99)
Potassium: 3.5 mmol/L (ref 3.5–5.1)
Sodium: 140 mmol/L (ref 135–145)

## 2022-02-20 LAB — CBC
HCT: 40.8 % (ref 39.0–52.0)
Hemoglobin: 13.6 g/dL (ref 13.0–17.0)
MCH: 33.1 pg (ref 26.0–34.0)
MCHC: 33.3 g/dL (ref 30.0–36.0)
MCV: 99.3 fL (ref 80.0–100.0)
Platelets: 147 10*3/uL — ABNORMAL LOW (ref 150–400)
RBC: 4.11 MIL/uL — ABNORMAL LOW (ref 4.22–5.81)
RDW: 12.9 % (ref 11.5–15.5)
WBC: 7.2 10*3/uL (ref 4.0–10.5)
nRBC: 0 % (ref 0.0–0.2)

## 2022-02-20 LAB — TROPONIN I (HIGH SENSITIVITY): Troponin I (High Sensitivity): 7 ng/L (ref <18)

## 2022-02-20 LAB — ETHANOL: Alcohol, Ethyl (B): 253 mg/dL — ABNORMAL HIGH (ref <10)

## 2022-02-20 NOTE — ED Triage Notes (Signed)
Pt brought in via ems.  Pt is homeless, was found on side of road.  Pt states he has a lot of health problems.  Etoh use today.    Pt alert

## 2022-02-20 NOTE — ED Provider Triage Note (Signed)
Emergency Medicine Provider Triage Evaluation Note  Jose [name] , a 61 y.o. male  was evaluated in triage.  Pt complains of generalized pain. He was brought in by EMS who found him on the side of the road. He denies SI, but reports HI. He admits to ETOH today.  Review of Systems  Positive: Generalized body pain, HI Negative: Head injury  Physical Exam  There were no vitals taken for this visit. Gen:   Awake, no distress   Resp:  Normal effort  MSK:   Moves extremities without difficulty  Other:    Medical Decision Making  Medically screening exam initiated at 8:49 PM.  Appropriate orders placed.  Jose [name] was informed that the remainder of the evaluation will be completed by another provider, this initial triage assessment does not replace that evaluation, and the importance of remaining in the ED until their evaluation is complete.    Chinita Pester, FNP 02/20/22 2053

## 2022-02-21 ENCOUNTER — Emergency Department
Admission: EM | Admit: 2022-02-21 | Discharge: 2022-02-21 | Disposition: A | Discharge status: Home / Self Care | Payer: No Typology Code available for payment source | Attending: Emergency Medicine | Admitting: Emergency Medicine

## 2022-02-21 DIAGNOSIS — Z5900 Homelessness unspecified: Secondary | ICD-10-CM

## 2022-02-21 DIAGNOSIS — F1010 Alcohol abuse, uncomplicated: Secondary | ICD-10-CM

## 2022-02-21 LAB — AMMONIA: Ammonia: 18 umol/L (ref 9–35)

## 2022-02-21 LAB — TROPONIN I (HIGH SENSITIVITY): Troponin I (High Sensitivity): 7 ng/L (ref <18)

## 2022-02-21 MED ORDER — ACETAMINOPHEN 500 MG PO TABS
1000.0000 mg | ORAL_TABLET | Freq: Once | ORAL | Status: AC
Start: 2022-02-21 — End: 2022-02-21
  Administered 2022-02-21: 1000 mg via ORAL
  Filled 2022-02-21: qty 2

## 2022-02-21 NOTE — ED Notes (Signed)
Patient ambulated to bathroom, continent of urine and ambulated back to bed with standby assist. Stretcher in low position with siderails up and bed alarm on. No distress noted at this time.

## 2022-02-21 NOTE — ED Notes (Signed)
Report off to rachael rn

## 2022-02-21 NOTE — ED Provider Notes (Signed)
[facility] Provider Note    Event Date/Time   First MD Initiated Contact with Patient 02/21/22 0217     (approximate)   History   Weakness   HPI  Jose [name] is a 61 y.o. male with a history of homelessness and polysubstance abuse was brought in via EMS for homelessness.  Patient was found on the side of the road.  Positive EtOH.  Patient is well-known to our department.  Has several complaints which are all chronic including chronic chest pain, chronic abdominal pain, chronic leg pain, chronic back pain.  Denies any changes on the chronic symptoms.  Patient reports that he is hungry and has not had anything to eat in a very long time.     Past Medical History:  Diagnosis Date   Hep C w/o coma, chronic (HCC)    Hypertension    Polysubstance abuse (HCC)     Past Surgical History:  Procedure Laterality Date   ESOPHAGOGASTRODUODENOSCOPY (EGD) WITH PROPOFOL N/A 03/02/2019   Procedure: ESOPHAGOGASTRODUODENOSCOPY (EGD) WITH PROPOFOL;  Surgeon: Violeta Gelinashompson, Burke, MD;  [facility];  Service: General;  Laterality: N/A;   IR GASTROSTOMY TUBE REMOVAL  05/04/2019   PEG PLACEMENT N/A 03/02/2019   Procedure: PERCUTANEOUS ENDOSCOPIC GASTROSTOMY (PEG) PLACEMENT;  Surgeon: Violeta Gelinashompson, Burke, MD;  [facility];  Service: General;  Laterality: N/A;   PERCUTANEOUS TRACHEOSTOMY N/A 02/12/2019   Procedure: PERCUTANEOUS TRACHEOSTOMY;  Surgeon: Violeta Gelinashompson, Burke, MD;  [facility];  Service: General;  Laterality: N/A;     Physical Exam   Triage Vital Signs: ED Triage Vitals  Enc Vitals Group     BP 02/20/22 2050 133/81     Pulse Rate 02/20/22 2050 81     Resp 02/20/22 2050 18     Temp 02/20/22 2050 98 F (36.7 C)     Temp Source 02/20/22 2050 Oral     SpO2 02/20/22 2050 97 %     Weight 02/20/22 2052 145 lb (65.8 kg)     Height 02/20/22 2052 5\' 8"  (1.727 m)     Head Circumference --      Peak Flow --      Pain Score 02/20/22 2052 10      Pain Loc --      Pain Edu? --      Excl. in GC? --     Most recent vital signs: Vitals:   02/21/22 0310 02/21/22 0414  BP: (!) 144/97 138/80  Pulse: 78 77  Resp: 16 16  Temp:    SpO2: 99% 98%     Constitutional: Alert and oriented. Well appearing and in no apparent distress. HEENT:      Head: Normocephalic and atraumatic.         Eyes: Conjunctivae are normal. Sclera is non-icteric.       Mouth/Throat: Mucous membranes are moist.       Neck: Supple with no signs of meningismus. Cardiovascular: Regular rate and rhythm. No murmurs, gallops, or rubs. 2+ symmetrical distal pulses are present in all extremities.  Respiratory: Normal respiratory effort. Lungs are clear to auscultation bilaterally.  Gastrointestinal: Soft, non tender, and non distended with positive bowel sounds. No rebound or guarding. Genitourinary: No CVA tenderness. Musculoskeletal:  No edema, cyanosis, or erythema of extremities. Neurologic: Normal speech and language. Face is symmetric. Moving all extremities. No gross focal neurologic deficits are appreciated. Skin: Skin is warm, dry and intact. No rash noted. Psychiatric: Mood and affect are normal. Speech and behavior  are normal.  ED Results / Procedures / Treatments   Labs (all labs ordered are listed, but only abnormal results are displayed) Labs Reviewed  BASIC METABOLIC PANEL - Abnormal; Notable for the following components:      Result Value   Glucose, Bld 102 (*)    All other components within normal limits  CBC - Abnormal; Notable for the following components:   RBC 4.11 (*)    Platelets 147 (*)    All other components within normal limits  ETHANOL - Abnormal; Notable for the following components:   Alcohol, Ethyl (B) 253 (*)    All other components within normal limits  AMMONIA  TROPONIN I (HIGH SENSITIVITY)  TROPONIN I (HIGH SENSITIVITY)     EKG  ED ECG REPORT I, Nita Sickle, the attending physician, personally viewed and  interpreted this ECG.  Sinus rhythm with a right bundle branch block, rate of 83, no ST elevations or depressions.  Unchanged when compared to prior  RADIOLOGY  none  PROCEDURES:  Critical Care performed: No  Procedures    IMPRESSION / MDM / ASSESSMENT AND PLAN / ED COURSE  I reviewed the triage vital signs and the nursing notes.   61 y.o. male with a history of homelessness and polysubstance abuse was brought in via EMS for homelessness.  Patient has chronic pain and is seen here frequently for his chronic symptoms.  Today he is here just because he was homeless and found on the side of the road by EMS.  He denies any acute changes on the chronicity of the symptoms.  He is otherwise well-appearing and in no distress with normal vital signs, normal work of breathing normal sats, lungs are clear to auscultation, abdomen is soft and nontender.  There is no signs of trauma.  EKG and 2 high-sensitivity troponins are negative for ischemia.  No signs of dysrhythmia on telemetry.  Alcohol level of 253 with no signs of alcoholic ketoacidosis or any electrolyte derangements.  No signs of sepsis.  Ammonia level is negative.  Will give Tylenol for his chronic pain.  We will provide him with a meal.  Would allow patient to metabolize in the ED for discharge in the morning.  Currently there is no concerns for any acute emergent pathology  MEDICATIONS GIVEN IN ED: Medications  acetaminophen (TYLENOL) tablet 1,000 mg (1,000 mg Oral Given 02/21/22 0409)    EMR reviewed clued and several recent visits to the ED for chest pain and imaging done.    FINAL CLINICAL IMPRESSION(S) / ED DIAGNOSES   Final diagnoses:  Homelessness  Alcohol abuse     Rx / DC Orders   ED Discharge Orders     None        Note:  This document was prepared using Dragon voice recognition software and may include unintentional dictation errors.   Please note:  Patient was evaluated in Emergency Department today for  the symptoms described in the history of present illness. Patient was evaluated in the context of the global COVID-19 pandemic, which necessitated consideration that the patient might be at risk for infection with the SARS-CoV-2 virus that causes COVID-19. Institutional protocols and algorithms that pertain to the evaluation of patients at risk for COVID-19 are in a state of rapid change based on information released by regulatory bodies including the CDC and federal and state organizations. These policies and algorithms were followed during the patient's care in the ED.  Some ED evaluations and interventions may be delayed  as a result of limited staffing during the pandemic.       Don Perking, [place], MD 02/21/22 [phone]

## 2022-02-21 NOTE — ED Notes (Signed)
Meal tray provided to patient per request

## 2022-02-21 NOTE — ED Notes (Signed)
Patient ambulated to bathroom and back to bed. Cola provided to patient per request. No distress noted at this time.

## 2022-02-22 NOTE — Congregational Nurse Program (Signed)
  Dept: [phone]   Congregational Nurse Program Note  Date of Encounter: 02/22/2022 Client to clinic for blood pressure check.  BP (!) 152/86 (BP Location: Left Arm, Patient Position: Sitting, Cuff Size: Normal)   Pulse (!) 101   SpO2 93%  Reports he was in the [facility] ED on 5/30, discharged on 5/31. He also reports he had a chest xray and that he still has blood clots in his lungs. No chest xray seen in results. He has an appointment on 6/6 with Dr. Dario Guardian to set up a PCP MD. RN to arrange transportation.   Past Medical History: Past Medical History:  Diagnosis Date   Hep C w/o coma, chronic (HCC)    Hypertension    Polysubstance abuse (HCC)     Encounter Details:  CNP Questionnaire - 02/22/22 1000       Questionnaire   Do you give verbal consent to treat you today? Yes    Location Patient Served  [name]    Visit Setting [facility] or Organization    Patient Status Homeless    Insurance Medicaid   [employer] helath plan   Insurance Referral N/A    Medication N/A    Medical Provider Yes   new patient apt with Dr. Dario Guardian on 6/6   Screening Referrals N/A    Medical Referral [facility]Clinic   RN assisting client in finding a PCP   Medical Appointment Made Non-[facility] PCP/clinic    Food Have Food Insecurities    Transportation Need transportation assistance    Housing/Utilities No permanent housing    Interpersonal Safety N/A    Intervention Support;Blood pressure;Educate    ED Visit Averted N/A    Life-Saving Intervention Made N/A

## 2022-03-06 NOTE — Congregational Nurse Program (Signed)
  Dept: [phone]   Congregational Nurse Program Note  Date of Encounter: 03/06/2022 Client to clinic for assistance with rescheduling his new PCP appointment. He was unable to attend last week. New apt made, 1 st available is July 18th. This is with Dr. Dario Guardian. Office to Regulatory affairs officer if a sooner apt becomes available. Client aware. Aloe applied to sunburn on his forehead. Education provided regarding wearing a hat. Support given. Past Medical History: Past Medical History:  Diagnosis Date   Hep C w/o coma, chronic (HCC)    Hypertension    Polysubstance abuse (HCC)     Encounter Details:  CNP Questionnaire - 03/06/22 1201       Questionnaire   Do you give verbal consent to treat you today? Yes    Location Patient Served  [name]    Visit Setting [facility] or Organization    Patient Status Homeless    Insurance Medicaid   [employer] helath plan   Insurance Referral N/A    Medication N/A    Medical Provider Yes   new patient apt with Dr. Dario Guardian on 6/6   Screening Referrals N/A    Medical Referral [facility]inic   RN assisting client in finding a PCP   Medical Appointment Made Non-[facility] PCP/clinic    Food Have Food Insecurities    Transportation Need transportation assistance    Housing/Utilities No permanent housing    Interpersonal Safety N/A    Intervention Support;Educate;Case Management    ED Visit Averted N/A    Life-Saving Intervention Made N/A

## 2022-03-13 ENCOUNTER — Emergency Department: Payer: Medicaid Other

## 2022-03-13 ENCOUNTER — Other Ambulatory Visit: Payer: Self-pay

## 2022-03-13 ENCOUNTER — Encounter: Payer: Self-pay | Admitting: Emergency Medicine

## 2022-03-13 ENCOUNTER — Emergency Department
Admission: EM | Admit: 2022-03-13 | Discharge: 2022-03-13 | Disposition: A | Discharge status: Home / Self Care | Payer: Medicaid Other | Attending: Emergency Medicine | Admitting: Emergency Medicine

## 2022-03-13 DIAGNOSIS — R197 Diarrhea, unspecified: Secondary | ICD-10-CM | POA: Insufficient documentation

## 2022-03-13 DIAGNOSIS — R000 Tachycardia, unspecified: Secondary | ICD-10-CM | POA: Diagnosis not present

## 2022-03-13 DIAGNOSIS — I10 Essential (primary) hypertension: Secondary | ICD-10-CM | POA: Diagnosis not present

## 2022-03-13 DIAGNOSIS — R109 Unspecified abdominal pain: Secondary | ICD-10-CM

## 2022-03-13 DIAGNOSIS — R112 Nausea with vomiting, unspecified: Secondary | ICD-10-CM | POA: Insufficient documentation

## 2022-03-13 DIAGNOSIS — F10129 Alcohol abuse with intoxication, unspecified: Secondary | ICD-10-CM | POA: Diagnosis not present

## 2022-03-13 DIAGNOSIS — R1013 Epigastric pain: Secondary | ICD-10-CM | POA: Insufficient documentation

## 2022-03-13 LAB — CBC
HCT: 41.1 % (ref 39.0–52.0)
Hemoglobin: 13.8 g/dL (ref 13.0–17.0)
MCH: 32.9 pg (ref 26.0–34.0)
MCHC: 33.6 g/dL (ref 30.0–36.0)
MCV: 97.9 fL (ref 80.0–100.0)
Platelets: 122 10*3/uL — ABNORMAL LOW (ref 150–400)
RBC: 4.2 MIL/uL — ABNORMAL LOW (ref 4.22–5.81)
RDW: 12.6 % (ref 11.5–15.5)
WBC: 6.2 10*3/uL (ref 4.0–10.5)
nRBC: 0 % (ref 0.0–0.2)

## 2022-03-13 LAB — COMPREHENSIVE METABOLIC PANEL
ALT: 35 U/L (ref 0–44)
AST: 77 U/L — ABNORMAL HIGH (ref 15–41)
Albumin: 4 g/dL (ref 3.5–5.0)
Alkaline Phosphatase: 83 U/L (ref 38–126)
Anion gap: 12 (ref 5–15)
BUN: 6 mg/dL (ref 6–20)
CO2: 24 mmol/L (ref 22–32)
Calcium: 9.7 mg/dL (ref 8.9–10.3)
Chloride: 103 mmol/L (ref 98–111)
Creatinine, Ser: 0.62 mg/dL (ref 0.61–1.24)
GFR, Estimated: >60 mL/min (ref >60)
Glucose, Bld: 102 mg/dL — ABNORMAL HIGH (ref 70–99)
Potassium: 3.6 mmol/L (ref 3.5–5.1)
Sodium: 139 mmol/L (ref 135–145)
Total Bilirubin: 0.8 mg/dL (ref 0.3–1.2)
Total Protein: 7.7 g/dL (ref 6.5–8.1)

## 2022-03-13 LAB — TROPONIN I (HIGH SENSITIVITY): Troponin I (High Sensitivity): 6 ng/L (ref <18)

## 2022-03-13 LAB — LIPASE, BLOOD: Lipase: 41 U/L (ref 11–51)

## 2022-03-13 MED ORDER — ONDANSETRON 4 MG PO TBDP: 4.0000 mg | ORAL_TABLET | Freq: Three times a day (TID) | ORAL | 0 refills | Status: DC | PRN

## 2022-03-13 MED ORDER — ONDANSETRON HCL 4 MG/2ML IJ SOLN
4.0000 mg | Freq: Once | INTRAMUSCULAR | Status: AC
  Administered 2022-03-13: 4 mg via INTRAVENOUS
  Filled 2022-03-13: qty 2

## 2022-03-13 MED ORDER — MORPHINE SULFATE (PF) 4 MG/ML IV SOLN
4.0000 mg | Freq: Once | INTRAVENOUS | Status: AC
  Administered 2022-03-13: 4 mg via INTRAVENOUS
  Filled 2022-03-13: qty 1

## 2022-03-13 MED ORDER — SODIUM CHLORIDE 0.9 % IV BOLUS
1000.0000 mL | Freq: Once | INTRAVENOUS | Status: AC
  Administered 2022-03-13: 1000 mL via INTRAVENOUS

## 2022-03-13 MED ORDER — LORAZEPAM 1 MG PO TABS
1.0000 mg | ORAL_TABLET | Freq: Once | ORAL | Status: AC
  Administered 2022-03-13: 1 mg via ORAL
  Filled 2022-03-13: qty 1

## 2022-03-13 MED ORDER — IOHEXOL 300 MG/ML  SOLN
100.0000 mL | Freq: Once | INTRAMUSCULAR | Status: AC | PRN
  Administered 2022-03-13: 100 mL via INTRAVENOUS

## 2022-03-13 NOTE — ED Triage Notes (Signed)
First Nurse Note:   [facility] Pt c/o mid-chest pain that radiates down his L arm and to his back. Also c/o of abd pain and nausea. States he did cocaine last night. Pt is A&Ox4 and NAD  12 Lead showed RBBB, EMS did  3 doses of Nitro Spray  324 ASA  18 G L AC  Pt is non-complaint with his blood thinners or blood pressure medications.  127 CBG 98.3 orally  97 HR  96% on RA  169/105

## 2022-03-13 NOTE — ED Triage Notes (Signed)
Pt endorses CP that started around 2 this morning. Endorses n/v. EDP in triage to evaluate.

## 2022-03-13 NOTE — ED Provider Triage Note (Signed)
Emergency Medicine Provider Triage Evaluation Note  Jose [name] , a 61 y.o. male  was evaluated in triage.  Pt complains of chest pain, admits to cocaine use.  Review of Systems  Positive: Chest pain Negative: Shortness of breath  Physical Exam  There were no vitals taken for this visit. Gen:   Awake, no distress   Resp:  Normal effort  MSK:   Moves extremities without difficulty  Other:    Medical Decision Making  Medically screening exam initiated at 12:52 PM.  Appropriate orders placed.  Jose [name] was informed that the remainder of the evaluation will be completed by another provider, this initial triage assessment does not replace that evaluation, and the importance of remaining in the ED until their evaluation is complete.  Pending labs including troponin   Jose Every, MD 03/13/22 1526

## 2022-03-13 NOTE — ED Provider Notes (Signed)
[facility] Provider Note    Event Date/Time   First MD Initiated Contact with Patient 03/13/22 1459     (approximate)  History   Chief Complaint: Chest Pain  HPI  Jose [name] is a 61 y.o. male with a past medical history of hypertension, alcohol abuse, presents emergency department for nausea vomiting and abdominal pain.  According to the patient he has been nauseated with frequent episodes of vomiting throughout the day today.  States he is experiencing some pain throughout his abdomen and epigastrium.  Patient states he has had an episode of diarrhea as well.  Patient denies any known fever.  Denies any shortness of breath.  Denies any chest pain during my evaluation.  Overall patient appears well, no distress does have diffuse tenderness across his abdomen on my exam.   Physical Exam   Triage Vital Signs: ED Triage Vitals  Enc Vitals Group     BP 03/13/22 1252 (!) 170/101     Pulse Rate 03/13/22 1252 96     Resp 03/13/22 1252 18     Temp 03/13/22 1252 98.3 F (36.8 C)     Temp Source 03/13/22 1252 Oral     SpO2 03/13/22 1252 98 %     Weight 03/13/22 1253 145 lb 8.1 oz (66 kg)     Height 03/13/22 1253 5\' 8"  (1.727 m)     Head Circumference --      Peak Flow --      Pain Score 03/13/22 1253 10     Pain Loc --      Pain Edu? --      Excl. in GC? --     Most recent vital signs: Vitals:   03/13/22 1252  BP: (!) 170/101  Pulse: 96  Resp: 18  Temp: 98.3 F (36.8 C)  SpO2: 98%    General: Awake, no distress.  CV:  Good peripheral perfusion.  Regular rate and rhythm  Resp:  Normal effort.  Equal breath sounds bilaterally.  Abd:  Soft, mild diffuse tenderness but no focal significant tenderness identified.  No rebound or guarding.   ED Results / Procedures / Treatments   EKG  EKG viewed and interpreted by myself shows a sinus tachycardia at 109 bpm with a widened QRS, normal axis, largely normal intervals.  Nonspecific ST changes  no ST elevation.  Morphology is largely unchanged from prior EKG 02/20/2022.  RADIOLOGY  I have interpreted the chest x-ray images no acute finding on my evaluation. Radiology is read the chest x-ray is negative.   MEDICATIONS ORDERED IN ED: Medications - No data to display  IMPRESSION / MDM / ASSESSMENT AND PLAN / ED COURSE  I reviewed the triage vital signs and the nursing notes.  Patient's presentation is most consistent with acute presentation with potential threat to life or bodily function.  Patient presents to the emergency department for nausea vomiting and abdominal discomfort.  Patient states that his abdomen has been hurting today he has been nauseated with frequent episodes of vomiting.  States 1 episode of diarrhea.  No dysuria.  Patient has mild diffuse tenderness on my examination.  Patient's lab work is overall reassuring, troponin of 6, CMP shows no significant abnormalities slight LFT elevation over the patient is an alcoholic.  CBC is normal.  I have added on a lipase.  We will obtain CT imaging of the abdomen/pelvis.  We will dose pain and nausea medication IV hydrate.  Patient states he has  an alcoholic has not drinking any alcohol today.  Patient is mildly tachycardic we will dose 1 mg of oral Ativan while awaiting results.  Patient also states he is homeless.  Patient's work-up is reassuring.  CT scan abdomen/pelvis is negative for acute finding.  Patient's lipase is normal, troponin negative.  Given the patient's reassuring work-up we will discharge with follow-up precautions.  FINAL CLINICAL IMPRESSION(S) / ED DIAGNOSES   Abdominal pain Nausea vomiting Alcohol withdrawal  Rx / DC Orders   Zofran  Note:  This document was prepared using Dragon voice recognition software and may include unintentional dictation errors.   Minna Antis, MD 03/13/22 1859

## 2022-03-14 NOTE — Congregational Nurse Program (Signed)
  Dept: [phone]   Congregational Nurse Program Note  Date of Encounter: 03/14/2022 Client to clinic  for follow frpm his [facility] ED visit yesterday. Client was sent from clinic to the ED via EMS for evaluation of chest pain and nausea. Labs showed no elevated troponin. He received anti-nausea medication and fluids. CXR was negative. Client reports he slept overnight in the ED and feels better today. BP remains elevated. He does have a scheduled visit with his new PCP on 7/18. Office to Regulatory affairs officer with any apt cancellations for and earlier date. He will continue to return to clinic for BP monitoring and support.    Past Medical History: Past Medical History:  Diagnosis Date   Hep C w/o coma, chronic (HCC)    Hypertension    Polysubstance abuse (HCC)     Encounter Details:  CNP Questionnaire - 03/14/22 1000       Questionnaire   Do you give verbal consent to treat you today? Yes    Location Patient Served  [name]    Visit Setting [facility] or Organization    Patient Status Homeless    Insurance Medicaid   [employer] helath plan   Insurance Referral N/A    Medication N/A    Medical Provider Yes   new patient apt with Dr. Dario Guardian on 6/6   Screening Referrals N/A    Medical Referral [facility]   RN assisting client in finding a PCP   Medical Appointment Made [facility] PCP/clinic    Food Have Food Insecurities    Transportation Need transportation assistance    Housing/Utilities No permanent housing    Interpersonal Safety N/A    Intervention Support;Educate;Blood pressure    ED Visit Averted N/A    Life-Saving Intervention Made N/A

## 2022-03-15 NOTE — Congregational Nurse Program (Signed)
  Dept: [phone]   Congregational Nurse Program Note  Date of Encounter: 03/15/2022 Client to clinic for for vital sign check and support. He reports feeling better today. BP 132/80 (BP Location: Left Arm, Patient Position: Sitting, Cuff Size: Normal)   Pulse (!) 106   SpO2 98% Comment: room air He will continue to check in at the clinic for vital sign check until his PCP apt in July.   Past Medical History: Past Medical History:  Diagnosis Date   Hep C w/o coma, chronic (HCC)    Hypertension    Polysubstance abuse (HCC)     Encounter Details:  CNP Questionnaire - 03/15/22 1203       Questionnaire   Do you give verbal consent to treat you today? Yes    Location Patient Served  [name]    Visit Setting [employer] or Organization    Patient Status Homeless    Insurance Medicaid   [employer] helath plan   Insurance Referral N/A    Medication N/A    Medical Provider Yes   new patient apt with Dr. Dario Guardian on 6/6   Screening Referrals N/A    Medical Referral [facility]inic   RN assisting client in finding a PCP   Medical Appointment Made N/A    Food Have Food Insecurities   has food stamps   Transportation Need transportation assistance    Housing/Utilities No permanent housing    Interpersonal Safety N/A    Intervention Support;Educate;Blood pressure    ED Visit Averted N/A    Life-Saving Intervention Made N/A

## 2022-03-22 NOTE — Congregational Nurse Program (Signed)
  Dept: [phone]   Congregational Nurse Program Note  Date of Encounter: 03/22/2022 Client to clinic for support and skin care. No other concerns today. Encouraged client to wear one of the hats he has been given as he is outdoors in the sun most of the day and has had sunburn this week.  Past Medical History: Past Medical History:  Diagnosis Date   Hep C w/o coma, chronic (HCC)    Hypertension    Polysubstance abuse (HCC)     Encounter Details:  CNP Questionnaire - 03/22/22 1308       Questionnaire   Do you give verbal consent to treat you today? Yes    Location Patient Served  [name]    Visit Setting [facility] or Organization    Patient Status Homeless    Insurance Medicaid   [employer] helath plan   Insurance Referral N/A    Medication N/A    Medical Provider Yes   new patient apt with Dr. Dario Guardian on 6/6   Screening Referrals N/A    Medical Referral [facility]inic   RN assisting client in finding a PCP   Medical Appointment Made N/A    Food Have Food Insecurities   has food stamps   Transportation Need transportation assistance    Housing/Utilities No permanent housing    Interpersonal Safety N/A    Intervention Support;Educate;Blood pressure    ED Visit Averted N/A    Life-Saving Intervention Made N/A

## 2022-04-03 NOTE — Congregational Nurse Program (Signed)
  Dept: [phone]   Congregational Nurse Program Note  Date of Encounter: 04/03/2022 Client to clinic for blood pressure check. He remembers that he has an upcoming appointment for his new PCP next week on 7/18. RN to set up transportation. Client aware and in agreement. Past Medical History: Past Medical History:  Diagnosis Date   Hep C w/o coma, chronic (HCC)    Hypertension    Polysubstance abuse (HCC)     Encounter Details:  CNP Questionnaire - 04/03/22 1139       Questionnaire   Do you give verbal consent to treat you today? Yes    Location Patient Served  [name]    Visit Setting [facility] or [facility]    Patient Status Homeless    Insurance Medicaid   [employer] helath plan   Insurance Referral N/A    Medication N/A    Medical Provider Yes   new patient apt with Dr. Dario Guardian on 6/6   Screening Referrals N/A    Medical Referral [facility]inic   RN assisting client in finding a PCP   Medical Appointment Made N/A    Food Have Food Insecurities   has food stamps   Transportation Need transportation assistance    Housing/Utilities No permanent housing    Interpersonal Safety N/A    Intervention Support;Educate;Blood pressure    ED Visit Averted N/A    Life-Saving Intervention Made N/A

## 2022-04-04 NOTE — Congregational Nurse Program (Signed)
  Dept: [phone]   Congregational Nurse Program Note  Date of Encounter: 04/04/2022 Client to clinic today for foot care. Feet washed, nails trimmed and lotion applied. No open areas or blisters noted. Client very appreciative of care provided. He remains aware of his upcoming Md appointment on 7/18. RN to arrange transportation. Past Medical History: Past Medical History:  Diagnosis Date   Hep C w/o coma, chronic (HCC)    Hypertension    Polysubstance abuse (HCC)     Encounter Details:  CNP Questionnaire - 04/04/22 1224       Questionnaire   Do you give verbal consent to treat you today? Yes    Location Patient Served  [name]    Visit Setting [facility] or [facility]    Patient Status Homeless    Insurance Medicaid   [employer] helath plan   Insurance Referral N/A    Medication N/A    Medical Provider Yes   new patient apt with Dr. Dario Guardian on 6/6   Screening Referrals N/A    Medical Referral [facility]Clinic   RN assisting client in finding a PCP   Medical Appointment Made N/A    Food Have Food Insecurities   has food stamps   Transportation Need transportation assistance    Housing/Utilities No permanent housing    Interpersonal Safety N/A    Intervention Support;Educate    ED Visit Averted N/A    Life-Saving Intervention Made N/A

## 2022-04-05 NOTE — Congregational Nurse Program (Signed)
  Dept: [phone]   Congregational Nurse Program Note  Date of Encounter: 04/05/2022 Client to clinic for vital sign check. Transportation set up for MD apt on 7/18. Pick up time 9:00 am for 9:30 apt. Client is aware.  BP (!) 148/90 (BP Location: Left Arm, Patient Position: Sitting, Cuff Size: Normal)   Pulse 93   SpO2 96%  No other needs at this time.   Past Medical History: Past Medical History:  Diagnosis Date   Hep C w/o coma, chronic (HCC)    Hypertension    Polysubstance abuse (HCC)     Encounter Details:  CNP Questionnaire - 04/05/22 1230       Questionnaire   Do you give verbal consent to treat you today? Yes    Location Patient Served  [name]    Visit Setting [facility] or Organization    Patient Status Homeless    Insurance Medicaid   [employer] helath plan   Insurance Referral N/A    Medication N/A    Medical Provider Yes   new patient apt with Dr. Dario Guardian on 6/6   Screening Referrals N/A    Medical Referral [facility]inic   RN assisting client in finding a PCP   Medical Appointment Made N/A    Food Have Food Insecurities   has food stamps   Transportation Need transportation assistance   Transportation set up with [employer]. Pick up time is 9:00 am at [facility] Client aware   Housing/Utilities No permanent housing    Interpersonal Safety N/A    Intervention Support;Educate;Blood pressure    ED Visit Averted N/A    Life-Saving Intervention Made N/A

## 2022-04-07 IMAGING — CR DG CHEST 2V
1 series · 2 of 2 positions shown · non-contrast
Comparison: 03/06/2019

CLINICAL DATA: Chest and neck pain for 5 days.

EXAM:
CHEST - 2 VIEW

[Series 1: dg chest 2 view · 0.14mm/px · 2 of 2 slices shown]
[im 1/2]
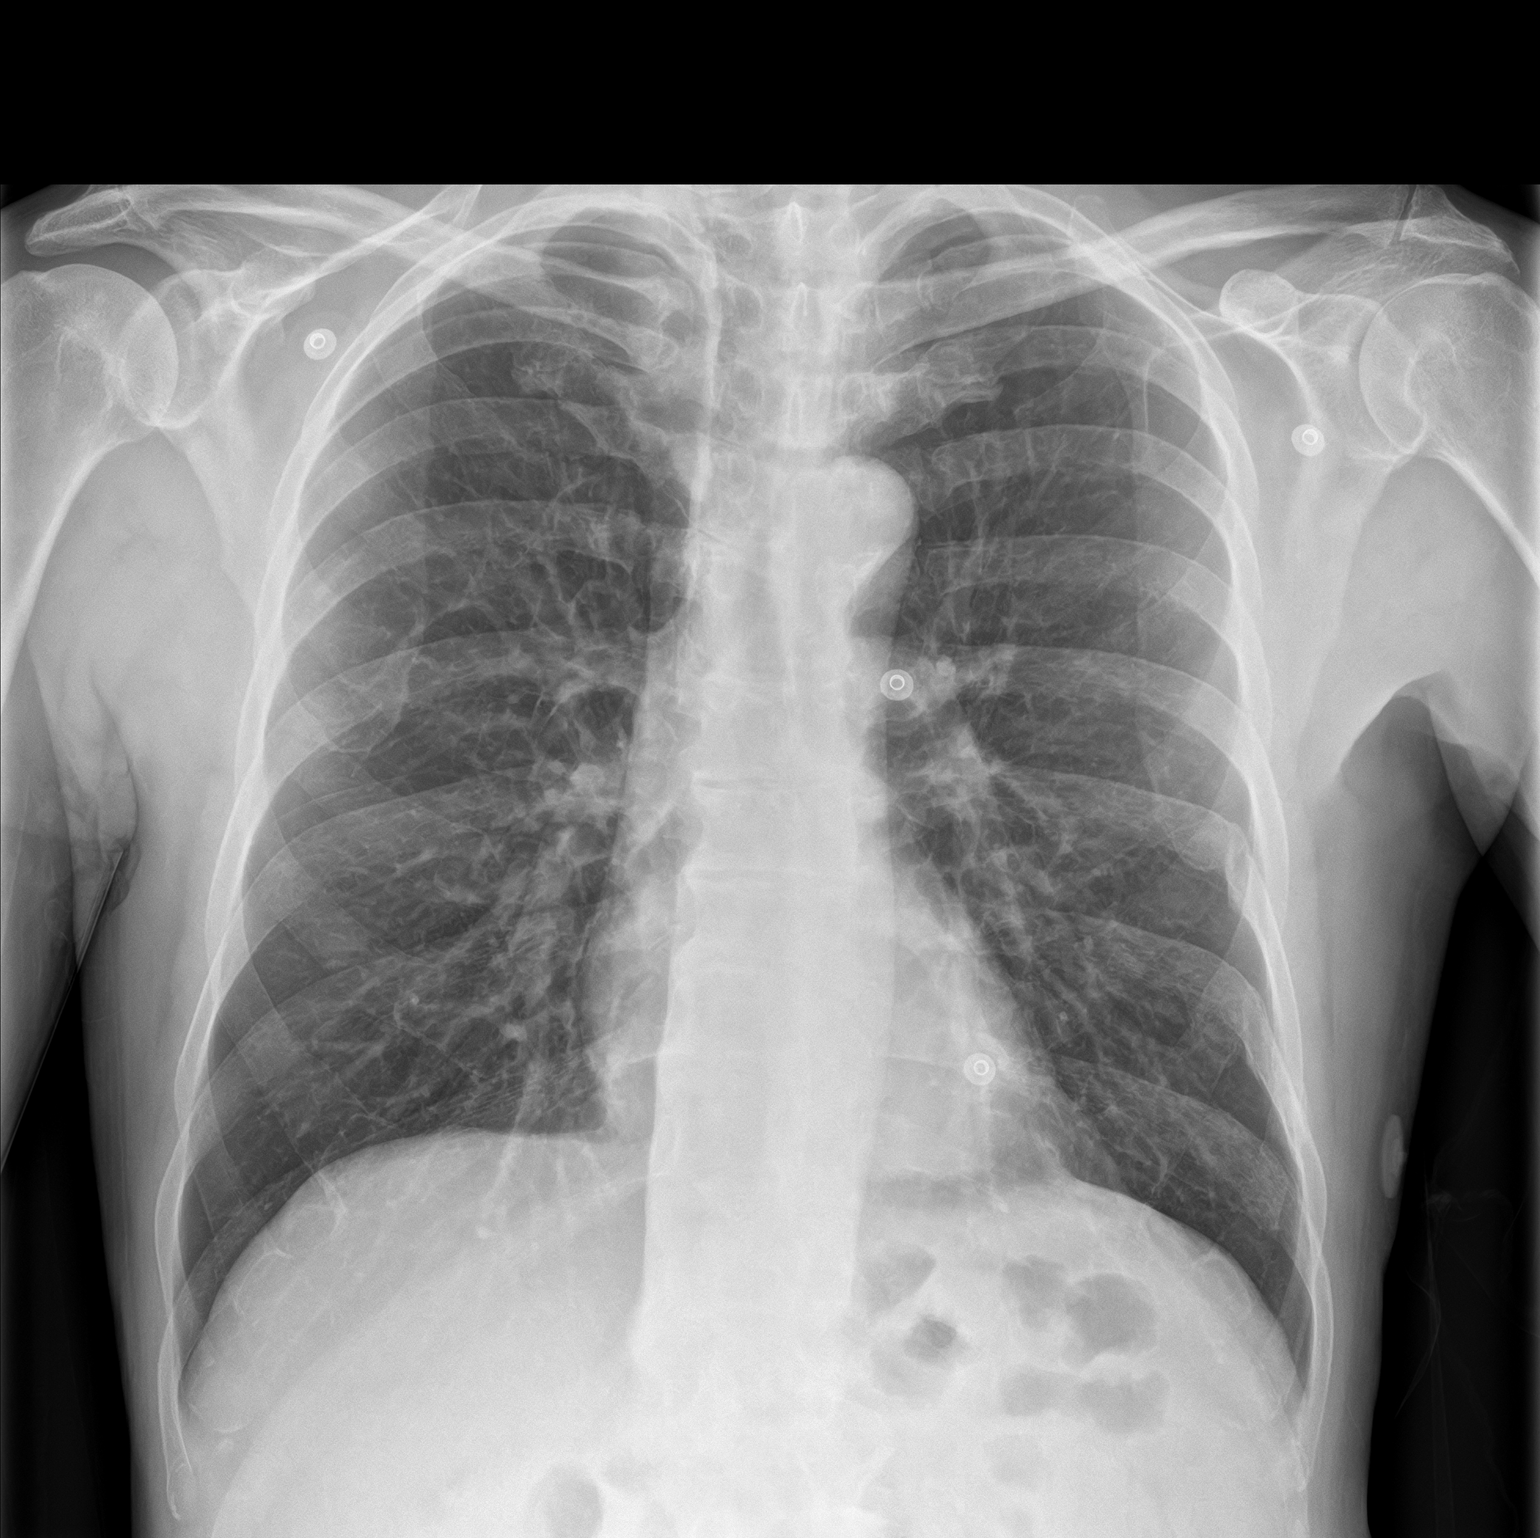
[im 2/2]
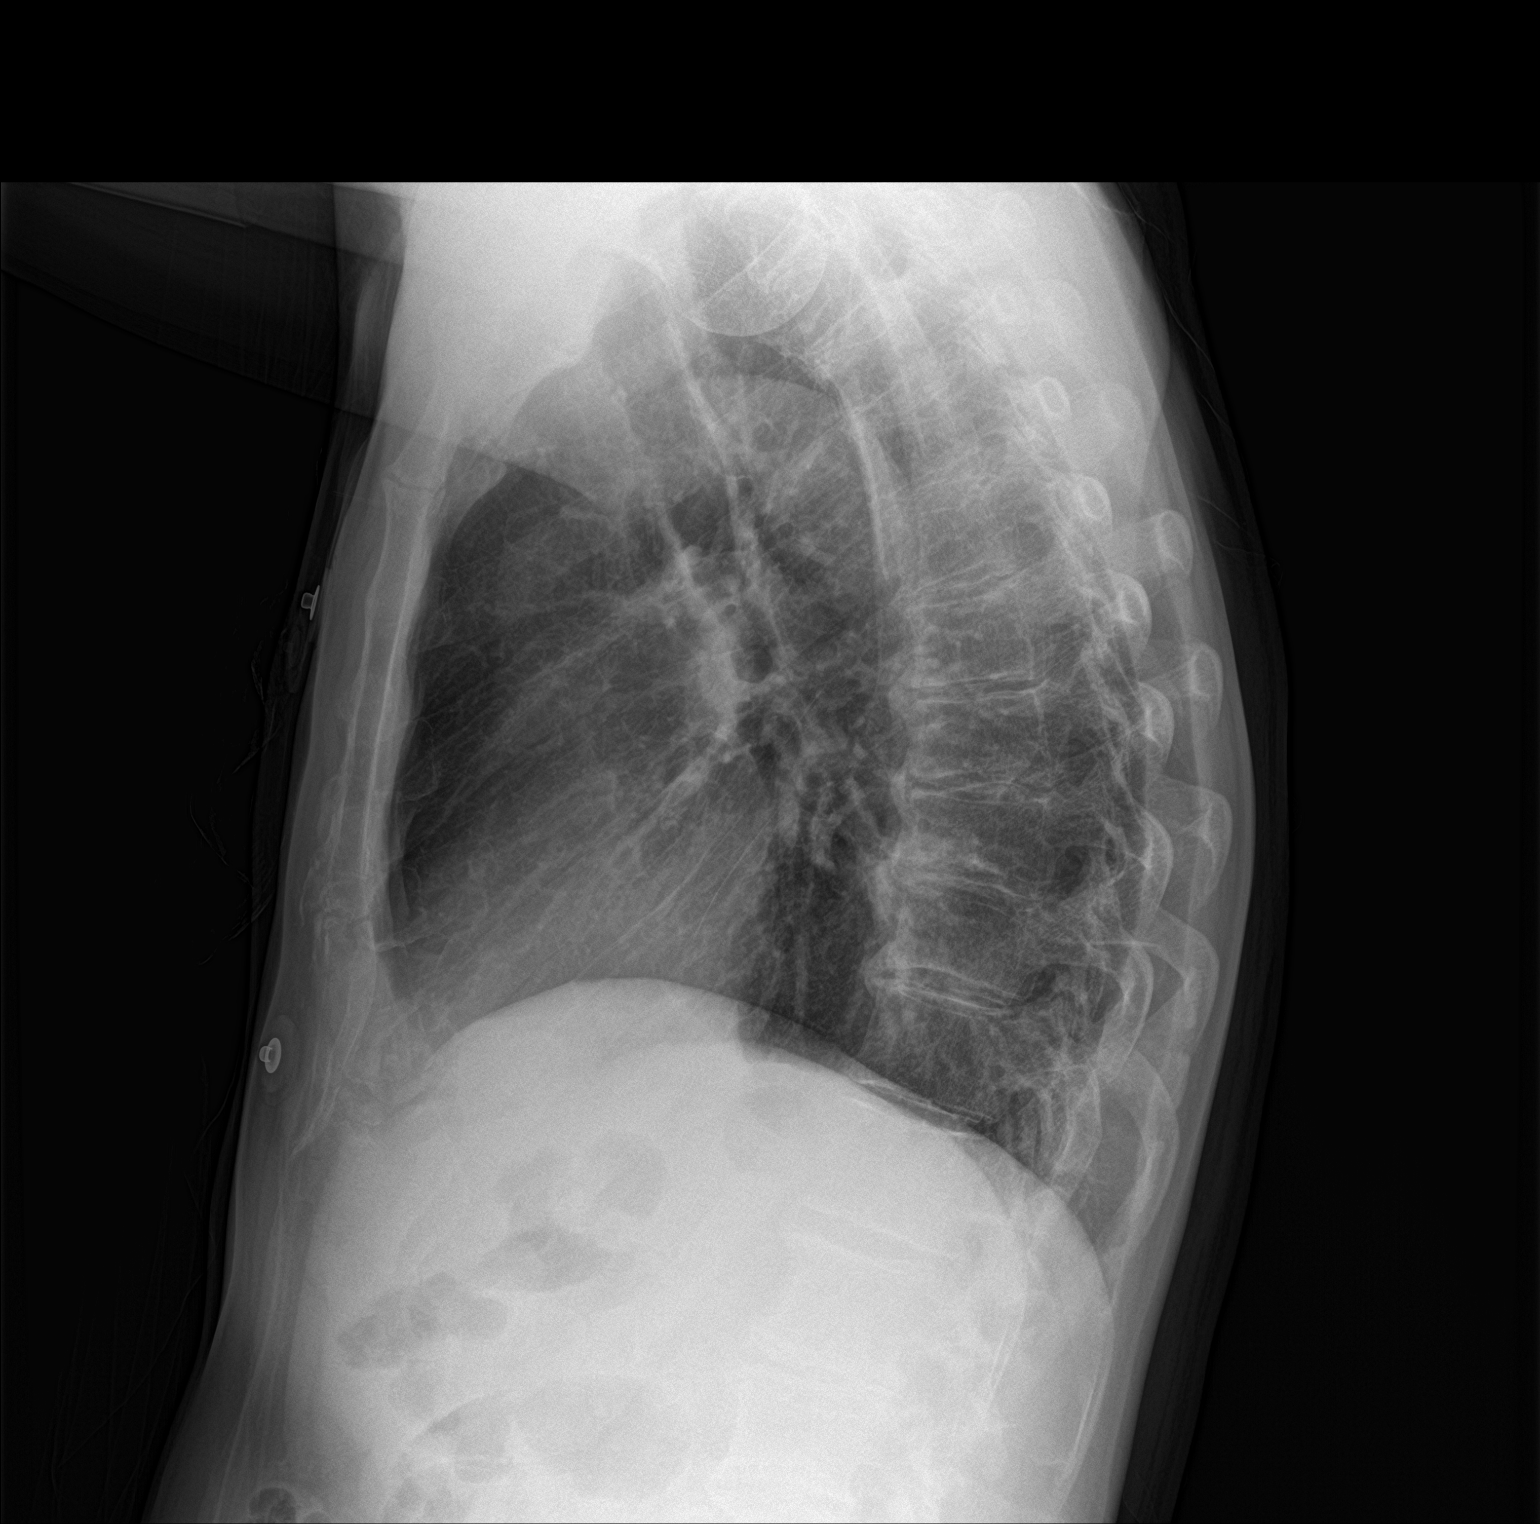

[2 of 2 positions shown; findings below may reference images not displayed]

FINDINGS: Emphysema. Atherosclerotic calcification of the aortic arch. Left
posterolateral eighth and ninth rib deformities likely from old
fractures. Heart size within normal limits. The lungs appear
otherwise clear. No blunting of the costophrenic angles. Mild
thoracic spondylosis.
IMPRESSION: 1. No acute findings.
2. Aortic Atherosclerosis ([id number]-HQ1.1) and Emphysema ([id number]).

## 2022-04-07 IMAGING — CT CT CERVICAL SPINE W/O CM
3 of 4 series · 12 of 35 positions shown, 14 images · non-contrast
Comparison: Cervical spine CT 11/18/2019

CLINICAL DATA: Patient reports cervical neck pain for 5 days.

EXAM:
CT CERVICAL SPINE WITHOUT CONTRAST
TECHNIQUE: Multidetector CT imaging of the cervical spine was performed without
intravenous contrast. Multiplanar CT image reconstructions were also
generated.

[Series 4: sagittal bone · sagittal · 0.42mm/px · 5 of 76 slices shown, 6 images]
[im 26/76  bone]
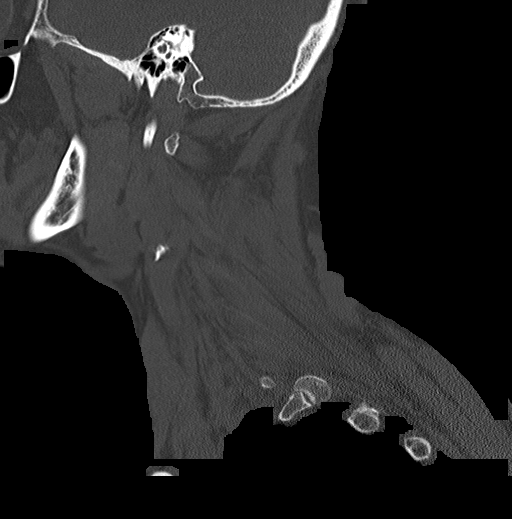
[im 32/76  bone]
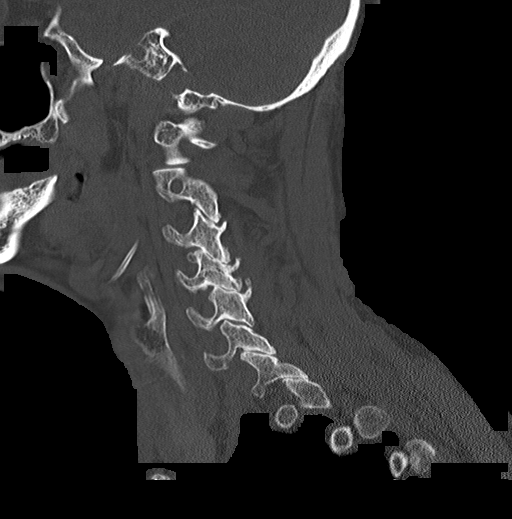
[im 38/76  soft-tissue]
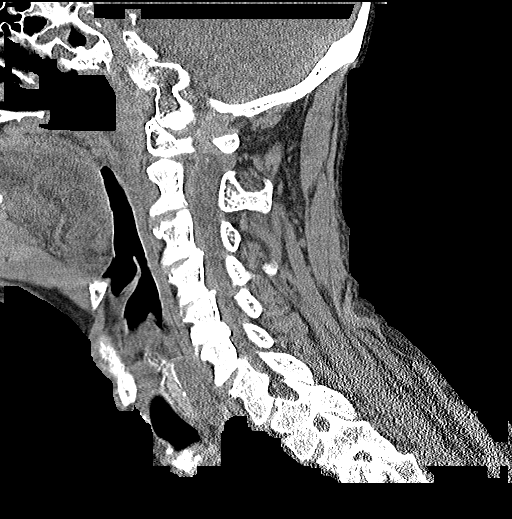
[im 38/76  bone]
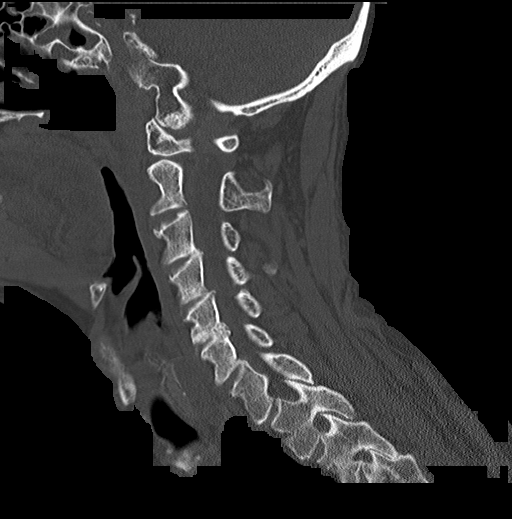
[im 44/76  bone]
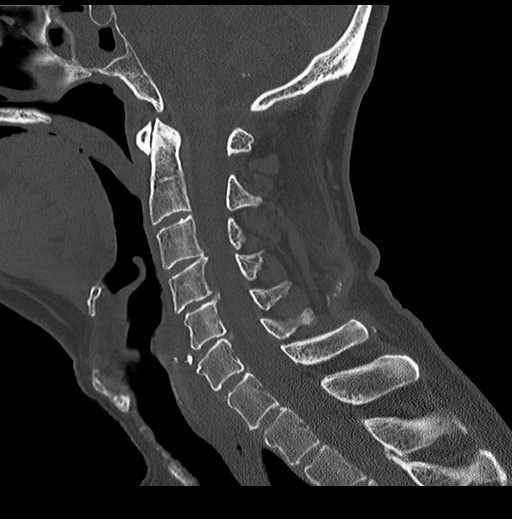
[im 51/76  bone]
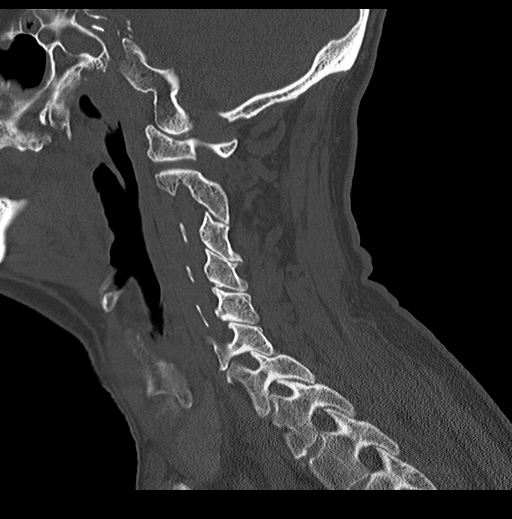

[Series 5: coronal bone · coronal · 0.30mm/px · 3 of 112 slices shown]
[im 38/112  bone]
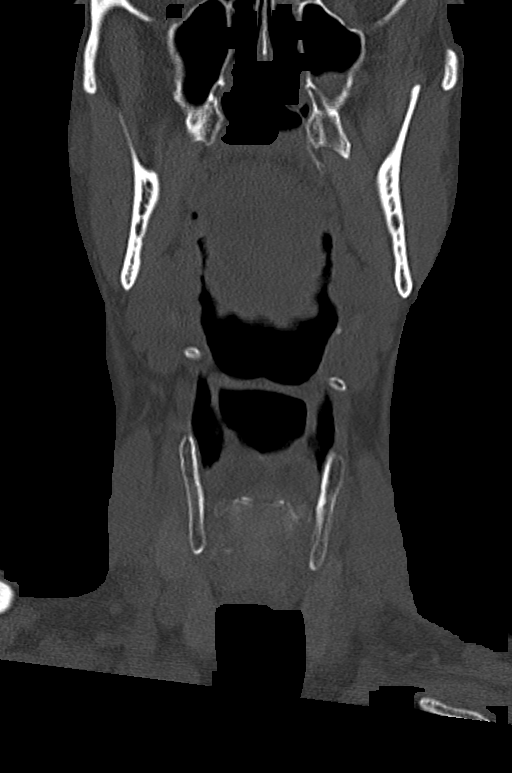
[im 50/112  bone]
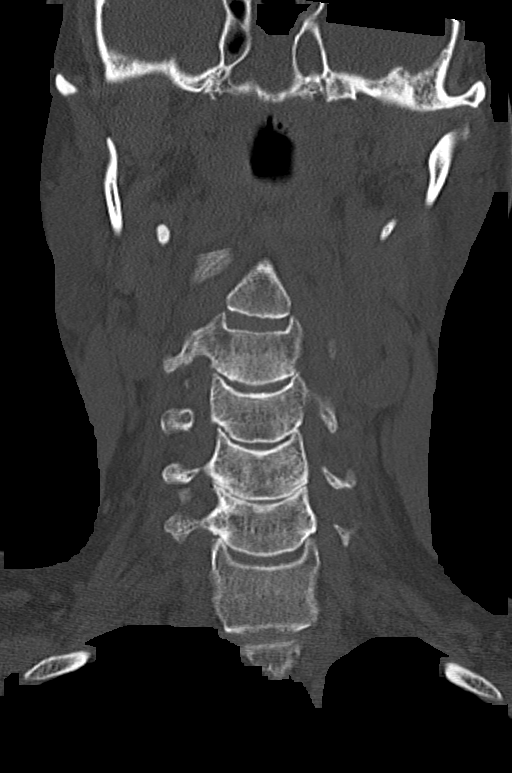
[im 62/112  bone]
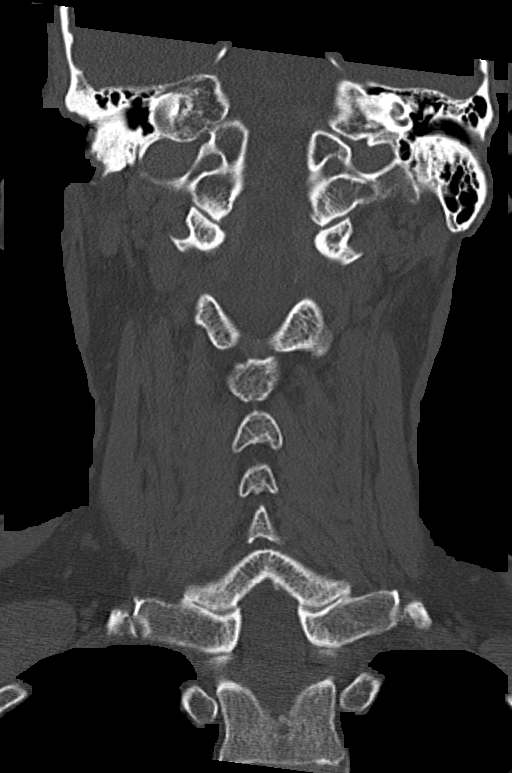

[Series 6: orthogonal bone · axial · 0.33mm/px · z∈[-426,-279]mm · 4 of 116 slices shown, 5 images]
[im 17/116  soft-tissue]
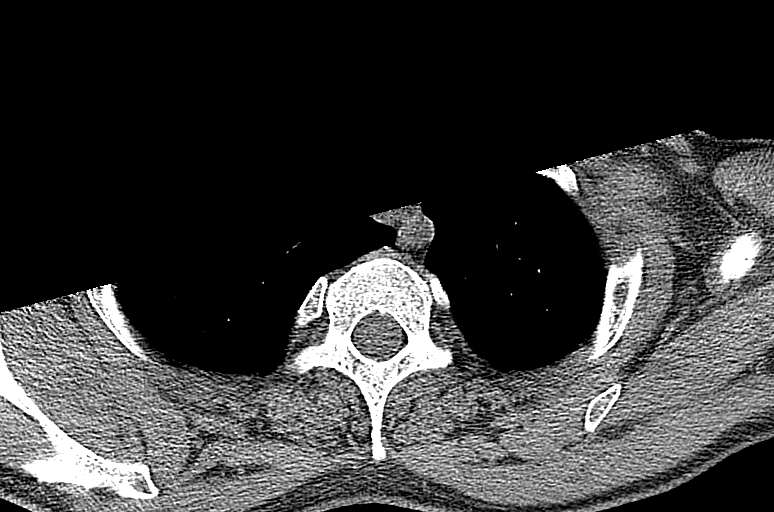
[im 17/116  bone]
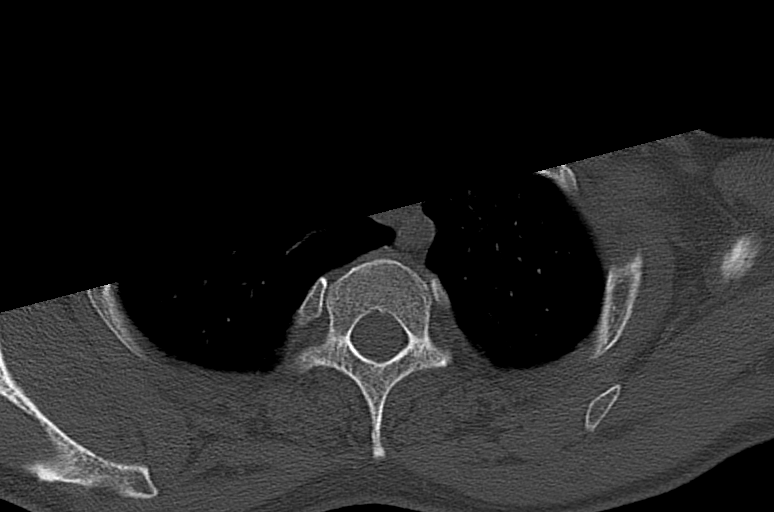
[im 50/116  bone]
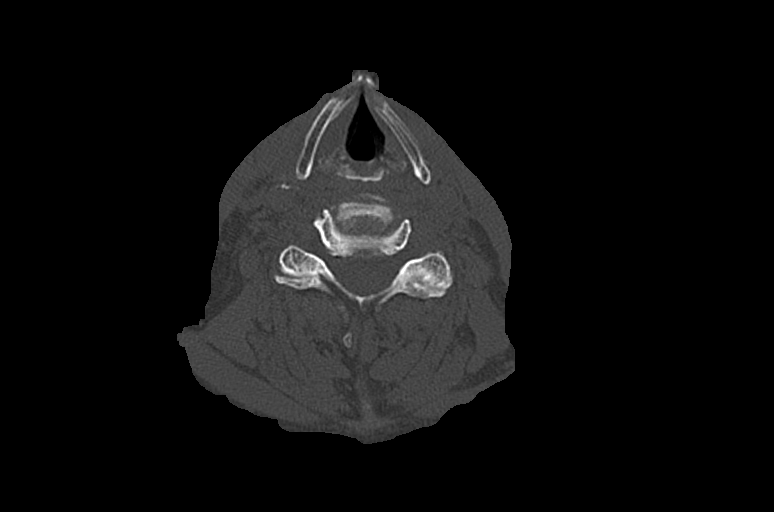
[im 66/116  bone]
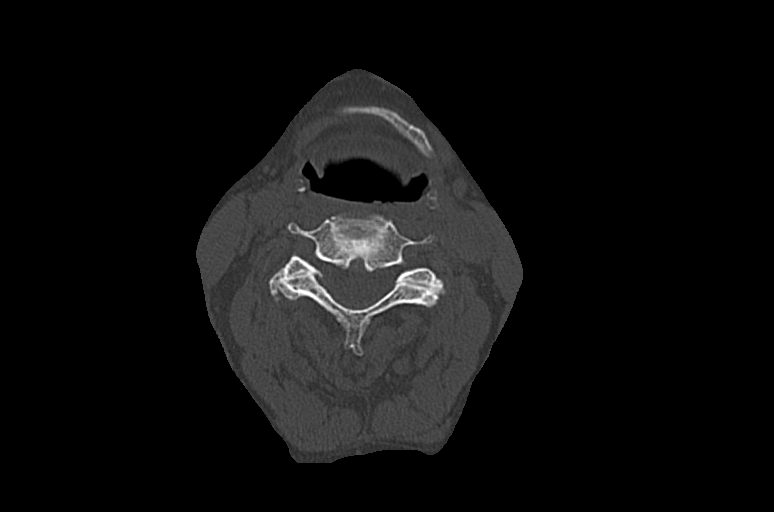
[im 99/116  bone]
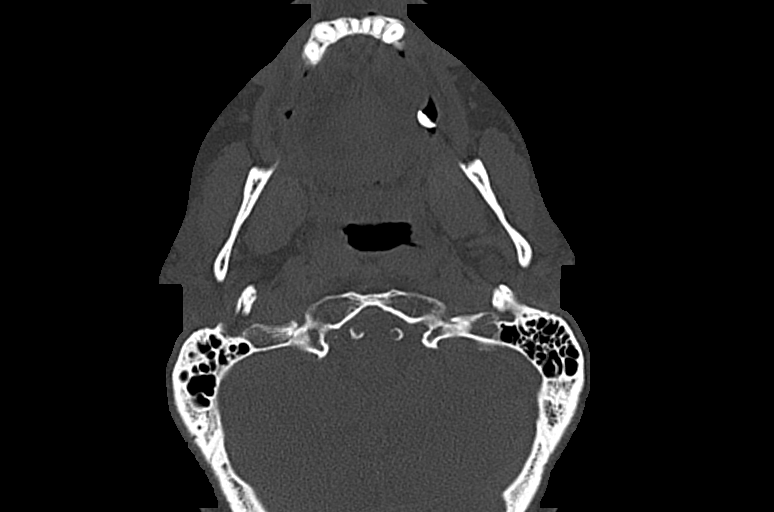

[12 of 35 positions shown; findings below may reference images not displayed]

FINDINGS: Alignment: Stable from prior exam. Trace retrolisthesis of C5 on C6.

Skull base and vertebrae: No acute fracture. No primary bone lesion
or focal pathologic process.

Soft tissues and spinal canal: No prevertebral fluid or swelling. No
visible canal hematoma.

Disc levels: Multilevel disc space narrowing and endplate spurring,
most prominently affecting C4-C5 and C5-C6. Multilevel facet
hypertrophy. There is no high-grade canal stenosis.

Upper chest: Emphysema.  No acute findings.

Other: None.
IMPRESSION: 1. Multilevel degenerative disc disease and facet hypertrophy
throughout the cervical spine. Similar degenerative change to
11/18/2019 CT.
2. No acute findings.

Emphysema ([id number]).

## 2022-04-11 NOTE — Congregational Nurse Program (Signed)
  Dept: [phone]   Congregational Nurse Program Note  Date of Encounter: 04/11/2022 Client to clinic for vital sign check and to discuss his PCP apt yesterday. He was given orders for labs to be drawn at [facility] on [address] Client will need assistance with transportation. He was not prescribed any medications, Md stated he wanted lab results first. Client voiced understanding. Support given. Past Medical History: Past Medical History:  Diagnosis Date   Hep C w/o coma, chronic (HCC)    Hypertension    Polysubstance abuse (HCC)     Encounter Details:  CNP Questionnaire - 04/11/22 1114       Questionnaire   Do you give verbal consent to treat you today? Yes    Location Patient Served  [name]    Visit Setting [facility] or Organization    Patient Status Homeless    Insurance [place]   [employer] plan   Insurance Referral N/A    Medication N/A    Medical Provider Yes   client did go to his apt on 7/18, has follow up on 10/17 @2  p,m   Screening Referrals N/A    Medical Referral [facility]CP[facility]Clinic   Dr. @  [facility]   Medical Appointment Made N/A    Food Have Food Insecurities   has food stamps   Transportation Need transportation assistance    Housing/Utilities No permanent housing    Interpersonal Safety N/A    Intervention Support;Educate;Blood pressure    ED Visit Averted N/A    Life-Saving Intervention Made N/A

## 2022-04-12 NOTE — Congregational Nurse Program (Signed)
  Dept: [phone]   Congregational Nurse Program Note  Date of Encounter: 04/12/2022 Client to clinic for vital sign check and first aide. BP (!) 152/92 (BP Location: Left Arm, Patient Position: Sitting, Cuff Size: Normal)   Pulse 91   SpO2 97%  Small, shallow open area to the base of his left thumb, applied antibiotic ointment and bandaid. Client continues to sit outside in the sun with out his shirt or hat and no sunscreen. RN provides education regarding the need to wear a hat and keep his shirt on. He has been given multiple hats, but reports they are lost or stolen. He has an order from his new PCP for labs, RN to assist with transportation if client chooses to have labs drawn. Support given.  Past Medical History: Past Medical History:  Diagnosis Date   Hep C w/o coma, chronic (HCC)    Hypertension    Polysubstance abuse (HCC)     Encounter Details:  CNP Questionnaire - 04/12/22 1100       Questionnaire   Do you give verbal consent to treat you today? Yes    Location Patient Served  [name]    Visit Setting [facility] or Organization    Patient Status Homeless    Insurance Medicaid   [employer] hel[employer] plan   Insurance Referral N/A    Medication N/A   not curently taking medications   Medical Provider Yes   client did go to his apt on 7/18, has follow up on 10/17 @2  p,m.He also has an order for labs to be done   Screening Referrals N/A    Medical Referral Non-[facility] PCP/Clinic   Dr. @  [facility]   Medical Appointment Made N/A    Food Have Food Insecurities   has food stamps   Transportation Need transportation assistance    Housing/Utilities No permanent housing    Interpersonal Safety N/A    Intervention Support;Educate;Blood pressure    ED Visit Averted N/A    Life-Saving Intervention Made N/A
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
## 2022-04-17 NOTE — Congregational Nurse Program (Signed)
  Dept: [phone]   Congregational Nurse Program Note  Date of Encounter: 04/17/2022 Client to clinic for vital sign check. Also given sunscreen and he sits outside with out shelter to pan handle.  BP 140/90 (BP Location: Left Arm, Patient Position: Sitting, Cuff Size: Normal)   Pulse 98   SpO2 98%  RN continues to provide education regarding sun protection and skin care. Also discussed the order for labs to be drawn that was given at his PCP apt 2 weeks ago. Link bus can be an option as he needs to have them drawn at [facility] at [employer]. He stated he will consider this. Support given. Past Medical History: Past Medical History:  Diagnosis Date   Hep C w/o coma, chronic (HCC)    Hypertension    Polysubstance abuse (HCC)     Encounter Details:  CNP Questionnaire - 04/17/22 1200       Questionnaire   Do you give verbal consent to treat you today? Yes    Location Patient Served  [name]    Visit Setting [facility] or Organization    Patient Status Homeless    Insurance [facility]    Insurance Referral N/A    Medication N/A    Medical Provider Yes    Screening Referrals N/A    Medical Referral N/A    Medical Appointment Made N/A    Food Have Food Insecurities    Transportation Need transportation assistance    Housing/Utilities No permanent housing    Interpersonal Safety N/A    Intervention Blood pressure;Educate;Support    ED Visit Averted N/A    Life-Saving Intervention Made N/A

## 2022-04-18 NOTE — Congregational Nurse Program (Signed)
  Dept: [phone]   Congregational Nurse Program Note  Date of Encounter: 04/18/2022 Client to clinic for vital sign check and sunscreen. BP (!) 142/72 (BP Location: Left Arm, Patient Position: Sitting, Cuff Size: Normal)   Pulse 81   SpO2 98% . Sunscreen applied to face. Continued education provided regarding sun protection. Client remains ambivalent to having labs drawn as ordered by his new PCP. RN to continue to provide encouragement and support.  Past Medical History: Past Medical History:  Diagnosis Date   Hep C w/o coma, chronic (HCC)    Hypertension    Polysubstance abuse (HCC)     Encounter Details:  CNP Questionnaire - 04/18/22 1312       Questionnaire   Do you give verbal consent to treat you today? Yes    Location Patient Served  [name]    Visit Setting [facility] or Organization    Patient Status Homeless    Insurance [facility]    Insurance Referral N/A    Medication N/A    Medical Provider Yes   [facility], Dr. Dario Guardian   Screening Referrals N/A    Medical Referral N/A    Medical Appointment Made N/A    Food Have Food Insecurities    Transportation N/A    Housing/Utilities No permanent housing    Interpersonal Safety N/A    Intervention Blood pressure;Educate;Support    ED Visit Averted N/A    Life-Saving Intervention Made N/A

## 2022-04-19 NOTE — Congregational Nurse Program (Signed)
  Dept: [phone]   Congregational Nurse Program Note  Date of Encounter: 04/19/2022 Client to clinic for continued BP monitoring. BP (!) 142/80 (BP Location: Left Arm, Patient Position: Sitting, Cuff Size: Normal)   Pulse 86   SpO2 98%  Sunscreen applied to face, client declined taking sunscreen with him. Continued education provided regarding  sun protection and hydration. Water given out at clinic today. RN encouraged client to get the labs done that were ordered at his PCP apt on 7/17. Support given.  Past Medical History: Past Medical History:  Diagnosis Date   Hep C w/o coma, chronic (HCC)    Hypertension    Polysubstance abuse (HCC)     Encounter Details:  CNP Questionnaire - 04/19/22 1300       Questionnaire   Do you give verbal consent to treat you today? Yes    Location Patient Served  [name]    Visit Setting [facility] or Organization    Patient Status Homeless    Insurance [facility]    Insurance Referral N/A    Medication N/A    Medical Provider Yes   [facility], Dr. Dario Guardian   Screening Referrals N/A    Medical Referral N/A    Medical Appointment Made N/A    Food Have Food Insecurities    Transportation N/A   need sintermittent trnaportation asistance   Housing/Utilities No permanent housing    Interpersonal Safety N/A    Intervention Blood pressure;Educate;Support    ED Visit Averted N/A    Life-Saving Intervention Made N/A

## 2022-04-24 NOTE — Congregational Nurse Program (Signed)
  Dept: [phone]   Congregational Nurse Program Note  Date of Encounter: 04/24/2022 Client to clinic for blood pressure check. Foot care provided, nails trimmed and feet cleaned. BP (!) 140/78 (BP Location: Left Arm, Patient Position: Sitting, Cuff Size: Normal)   Pulse (!) 110   SpO2 96%  Discussed clients order for labs to be drawn. He continues to want to delay this. No other new concerns at this time. Emotional support given.  Past Medical History: Past Medical History:  Diagnosis Date   Hep C w/o coma, chronic (HCC)    Hypertension    Polysubstance abuse (HCC)     Encounter Details:  CNP Questionnaire - 04/24/22 1200       Questionnaire   Do you give verbal consent to treat you today? Yes    Location Patient Served  [name]    Visit Setting [facility] or Organization    Patient Status Homeless    Insurance [facility]    Insurance Referral N/A    Medication N/A    Medical Provider Yes   [facility], Dr. Dario Guardian   Screening Referrals N/A    Medical Referral N/A    Medical Appointment Made N/A    Food Have Food Insecurities    Transportation N/A   need sintermittent trnaportation asistance   Housing/Utilities No permanent housing    Interpersonal Safety N/A    Intervention Blood pressure;Educate;Support    ED Visit Averted N/A    Life-Saving Intervention Made N/A

## 2022-04-25 NOTE — Congregational Nurse Program (Signed)
  Dept: [phone]   Congregational Nurse Program Note  Date of Encounter: 04/25/2022 Client to clinic for continued blood pressure monitoring and support. Sunscreen applied to face. RN continues to provide encouragement to wear a hat for protection from the sun.  BP (!) 148/76 (BP Location: Left Arm, Patient Position: Sitting, Cuff Size: Normal)   Pulse 83   SpO2 97% . No new concerns today. Emotional support provided.  Past Medical History: Past Medical History:  Diagnosis Date   Hep C w/o coma, chronic (HCC)    Hypertension    Polysubstance abuse (HCC)     Encounter Details:  CNP Questionnaire - 04/25/22 1115       Questionnaire   Do you give verbal consent to treat you today? Yes    Location Patient Served  [name]    Visit Setting [facility] or Organization    Patient Status Homeless    Insurance [facility]    Insurance Referral N/A    Medication N/A    Medical Provider Yes   [facility], Dr. Dario Guardian   Screening Referrals N/A    Medical Referral N/A    Medical Appointment Made N/A    Food Have Food Insecurities    Transportation N/A   need sintermittent trnaportation asistance   Housing/Utilities No permanent housing    Interpersonal Safety N/A    Intervention Blood pressure;Educate;Support    ED Visit Averted N/A    Life-Saving Intervention Made N/A

## 2022-04-26 NOTE — Congregational Nurse Program (Signed)
  Dept: [phone]   Congregational Nurse Program Note  Date of Encounter: 04/26/2022 Client to clinic today reported he was "not feeling good". Did not sleep well last night, sleeping on a loading dock nearby. Declined breakfast, or pink bismuth.  BP (!) 142/84 (BP Location: Left Arm, Patient Position: Sitting, Cuff Size: Normal)   Pulse 91   SpO2 97% Discussed the need to follow up on lab draw appointment and [facility] referral from his PCP. He voiced understanding, but was not ready to go today.   Past Medical History: Past Medical History:  Diagnosis Date   Hep C w/o coma, chronic (HCC)    Hypertension    Polysubstance abuse (HCC)     Encounter Details:  CNP Questionnaire - 04/26/22 1208       Questionnaire   Do you give verbal consent to treat you today? Yes    Location Patient Served  [name]    Visit Setting [facility] or Organization    Patient Status Homeless    Insurance [facility]    Insurance Referral N/A    Medication N/A    Medical Provider Yes   [facility], Dr. Dario Guardian   Screening Referrals N/A    Medical Referral N/A    Medical Appointment Made N/A    Food Have Food Insecurities    Transportation N/A   need sintermittent trnaportation asistance   Housing/Utilities No permanent housing    Interpersonal Safety N/A    Intervention Blood pressure;Educate;Support    ED Visit Averted N/A    Life-Saving Intervention Made N/A
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
## 2022-05-01 NOTE — Congregational Nurse Program (Signed)
  Dept: [phone]   Congregational Nurse Program Note  Date of Encounter: 05/01/2022 Client to clinic for vital sign check and sunscreen.  BP 130/78 (BP Location: Left Arm, Patient Position: Sitting, Cuff Size: Normal)   Pulse 94   SpO2 96% . He is still unable to commit to having labs drawn as ordered by his PCP. RN to continue to provide education and support regarding sun protection and need for lab draw.  Past Medical History: Past Medical History:  Diagnosis Date   Hep C w/o coma, chronic (HCC)    Hypertension    Polysubstance abuse (HCC)     Encounter Details:  CNP Questionnaire - 05/01/22 1159       Questionnaire   Do you give verbal consent to treat you today? Yes    Location Patient Served  [name]    Visit Setting [facility] or Organization    Patient Status Homeless    Insurance [facility]    Insurance Referral N/A    Medication N/A    Medical Provider Yes   [facility], Dr. Dario Guardian   Screening Referrals N/A    Medical Referral N/A    Medical Appointment Made N/A    Food Have Food Insecurities    Transportation N/A   need sintermittent trnaportation asistance   Housing/Utilities No permanent housing    Interpersonal Safety N/A    Intervention Blood pressure;Educate;Support    ED Visit Averted N/A    Life-Saving Intervention Made N/A

## 2022-05-09 NOTE — Congregational Nurse Program (Signed)
  Dept: [phone]   Congregational Nurse Program Note  Date of Encounter: 05/09/2022 Client to [facility] for vital sign check.  BP 138/88 (BP Location: Left Arm, Patient Position: Sitting, Cuff Size: Normal)   Pulse (!) 118   SpO2 97%  Sunscreen and lotion provided for face. Support given.No other needs today.  Past Medical History: Past Medical History:  Diagnosis Date   Hep C w/o coma, chronic (HCC)    Hypertension    Polysubstance abuse (HCC)     Encounter Details:  CNP Questionnaire - 05/09/22 1214       Questionnaire   Do you give verbal consent to treat you today? Yes    Location Patient Served  [name]    Visit Setting [facility] or Organization    Patient Status Homeless    Insurance [facility]    Insurance Referral N/A    Medication N/A    Medical Provider Yes   [facility], Dr. Dario Guardian   Screening Referrals N/A    Medical Referral N/A    Medical Appointment Made N/A    Food Have Food Insecurities    Transportation N/A   needs intermittent trnaportation asistance   Housing/Utilities No permanent housing    Interpersonal Safety N/A    Intervention Blood pressure;Educate;Support    ED Visit Averted N/A    Life-Saving Intervention Made N/A

## 2022-05-10 NOTE — Congregational Nurse Program (Signed)
  Dept: [phone]   Congregational Nurse Program Note  Date of Encounter: 05/10/2022 Client to [facility] clinic today for support and blood pressure check.  BP 126/82 (BP Location: Left Arm, Patient Position: Sitting, Cuff Size: Normal)   Pulse (!) 108   SpO2 96% . His pulse rate has been increased all week. He denied using any new drugs, only drinking alcohol daily. At today's visit he admitted to drinking heavily last evening into the morning. RN encouraged him to hydrate and rest as food and drink are provided at the center today. Emotional support and active listening provided.  Past Medical History: Past Medical History:  Diagnosis Date   Hep C w/o coma, chronic (HCC)    Hypertension    Polysubstance abuse (HCC)     Encounter Details:  CNP Questionnaire - 05/10/22 0947       Questionnaire   Do you give verbal consent to treat you today? Yes    Location Patient Served  [name]    Visit Setting [facility] or Organization    Patient Status Homeless    Insurance [facility]    Insurance Referral N/A    Medication N/A    Medical Provider Yes   [facility], Dr. Dario Guardian   Screening Referrals N/A    Medical Referral N/A    Medical Appointment Made N/A    Food Have Food Insecurities    Transportation N/A   needs intermittent trnaportation asistance   Housing/Utilities No permanent housing    Interpersonal Safety N/A    Intervention Blood pressure;Educate;Support    ED Visit Averted N/A    Life-Saving Intervention Made N/A

## 2022-05-15 NOTE — Congregational Nurse Program (Signed)
  Dept: [phone]   Congregational Nurse Program Note  Date of Encounter: 05/15/2022 Client to [facility] clinic today for blood pressure check and emotional support. We discussed again the need for him to follow up with getting his labs done as ordered at his PCP apt in July. He as always voiced understanding and agreement. BP 138/78 (BP Location: Left Arm, Patient Position: Sitting, Cuff Size: Normal)   Pulse (!) 105   SpO2 94% . Emotional support given.  Past Medical History: Past Medical History:  Diagnosis Date   Hep C w/o coma, chronic (HCC)    Hypertension    Polysubstance abuse (HCC)     Encounter Details:  CNP Questionnaire - 05/15/22 1131       Questionnaire   Do you give verbal consent to treat you today? Yes    Location Patient Served  [name]    Visit Setting [facility] or Organization    Patient Status Homeless    Insurance [facility]    Insurance Referral N/A    Medication N/A    Medical Provider Yes   [facility], Dr. Dario Guardian   Screening Referrals N/A    Medical Referral N/A    Medical Appointment Made N/A    Food Have Food Insecurities    Transportation N/A   needs intermittent trnaportation asistance   Housing/Utilities No permanent housing    Interpersonal Safety N/A    Intervention Blood pressure;Educate;Support    ED Visit Averted N/A    Life-Saving Intervention Made N/A
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
## 2022-05-22 NOTE — Congregational Nurse Program (Signed)
  Dept: [phone]   Congregational Nurse Program Note  Date of Encounter: 05/22/2022 Client to [facility] clinic for vital sign check and emotional support. BP (!) 142/88 (BP Location: Left Arm, Patient Position: Sitting, Cuff Size: Normal)   Pulse (!) 103   SpO2 97% He reports he did not drink alcohol yesterday or today yet. He has "gotten sick" from not drinking in the past due to his long standing alcoholism. He pan handles for money to by alcohol/food and some times is able to drink with friends. He also reports he has a friend that he is able to stay with on occasion when the weather is bad. He has not yet had his labs drawn that were ordered by his PCP in July. Discussed again the importance of this. Emotional support and couseling provided.   Past Medical History: Past Medical History:  Diagnosis Date   Hep C w/o coma, chronic (HCC)    Hypertension    Polysubstance abuse (HCC)     Encounter Details:  CNP Questionnaire - 05/22/22 1154       Questionnaire   Do you give verbal consent to treat you today? Yes    Location Patient Served  [name]    Visit Setting [facility] or Organization    Patient Status Homeless    Insurance [facility]    Insurance Referral N/A    Medication N/A    Medical Provider Yes   [facility], Dr. Dario Guardian   Screening Referrals N/A    Medical Referral N/A    Medical Appointment Made N/A    Food Have Food Insecurities    Transportation N/A   needs intermittent trnaportation asistance   Housing/Utilities No permanent housing    Interpersonal Safety N/A    Intervention Blood pressure;Educate;Support    ED Visit Averted N/A    Life-Saving Intervention Made N/A

## 2022-05-24 NOTE — Congregational Nurse Program (Signed)
  Dept: [phone]   Congregational Nurse Program Note  Date of Encounter: 05/24/2022 Client to [facility] today for vital sign check and skin care. BP 138/76 (BP Location: Left Arm, Patient Position: Sitting, Cuff Size: Normal)   Pulse 87   SpO2 100%  Sunscreen and lotion applied to face. First aide to skin tear to right outer forearm. Support and and spiritual care provided.    Past Medical History: Past Medical History:  Diagnosis Date   Hep C w/o coma, chronic (HCC)    Hypertension    Polysubstance abuse (HCC)     Encounter Details:  CNP Questionnaire - 05/24/22 1300       Questionnaire   Do you give verbal consent to treat you today? Yes    Location Patient Served  [name]    Visit Setting [facility] or Organization    Patient Status Homeless    Insurance [facility]    Insurance Referral N/A    Medication N/A    Medical Provider Yes   [facility], Dr. Dario Guardian   Screening Referrals N/A    Medical Referral N/A    Medical Appointment Made N/A    Food Have Food Insecurities    Transportation N/A   needs intermittent trnaportation asistance   Housing/Utilities No permanent housing    Interpersonal Safety N/A    Intervention Blood pressure;Educate;Support    ED Visit Averted N/A    Life-Saving Intervention Made N/A

## 2022-06-07 NOTE — Congregational Nurse Program (Signed)
  Dept: [phone]   Congregational Nurse Program Note  Date of Encounter: 06/07/2022 Client to [facility] clinic today for vital sign check and support. He reports having had a "bad week". Clean socks, snacks and water given. Much emotional support given. RN assisted client with calling 2 of his friends, but he was unsuccessful at reaching them. Client reports he needs to reapply for his food stamps and plans to return to [facility] on Monday for assistance. Sunscreen and lotion applied to face and arms. Client appreciative of care provided. Past Medical History: Past Medical History:  Diagnosis Date   Hep C w/o coma, chronic (HCC)    Hypertension    Polysubstance abuse (HCC)     Encounter Details:  CNP Questionnaire - 06/07/22 1155       Questionnaire   Do you give verbal consent to treat you today? Yes    Location Patient Served  [name]    Visit Setting [facility] or Organization    Patient Status Homeless    Insurance [facility]    Insurance Referral N/A    Medication N/A    Medical Provider Yes   [facility], Dr. Dario Guardian   Screening Referrals N/A    Medical Referral N/A    Medical Appointment Made N/A    Food Have Food Insecurities   needs to reapply for food stamps   Transportation N/A   needs intermittent trnaportation asistance   Housing/Utilities No permanent housing    Interpersonal Safety N/A    Intervention Blood pressure;Educate;Support    ED Visit Averted N/A    Life-Saving Intervention Made N/A

## 2022-06-10 IMAGING — CR DG CHEST 2V
2 series · 2 of 2 positions shown · non-contrast
Comparison: May 14, 2021

CLINICAL DATA: Chest pain.

EXAM:
CHEST - 2 VIEW

[chest pa]
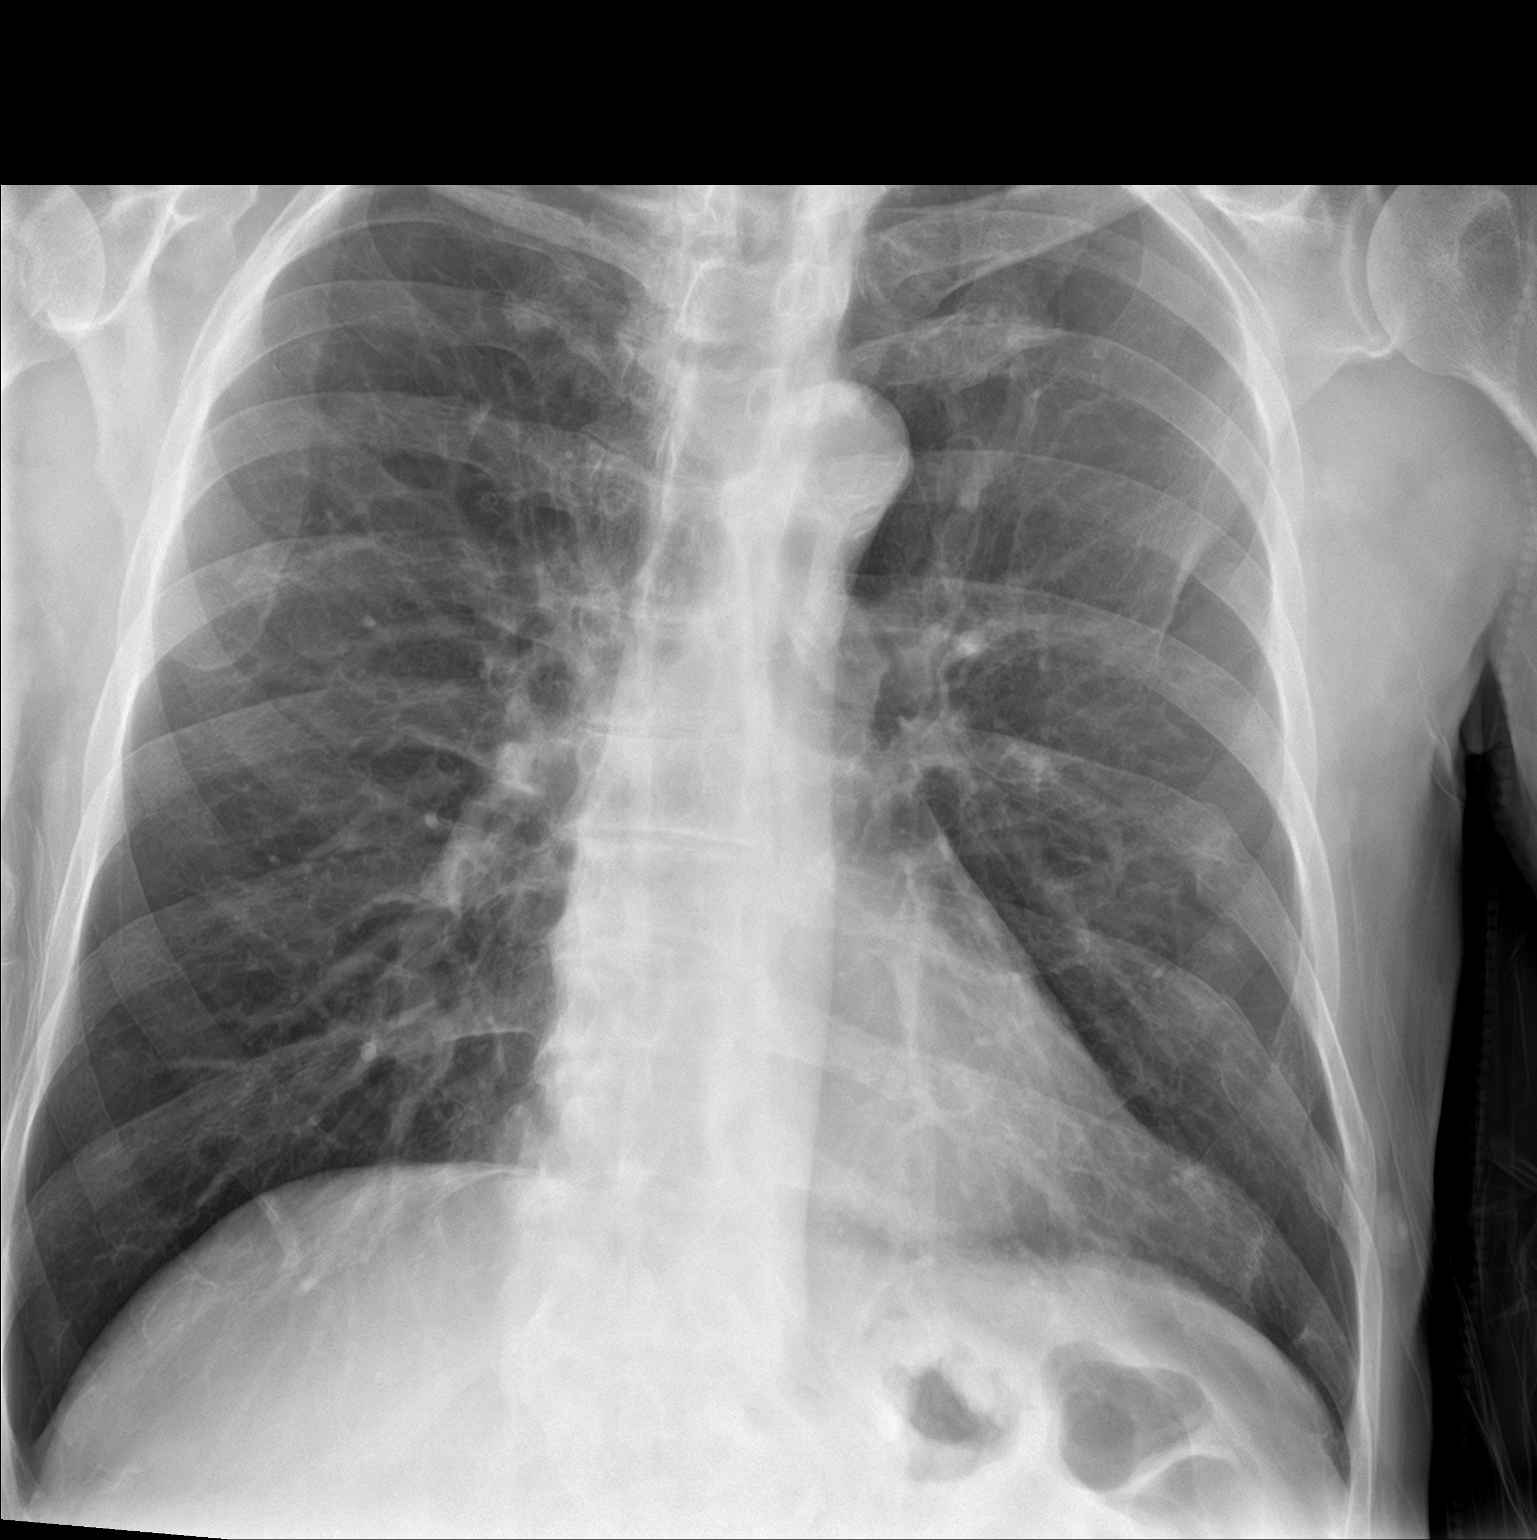

[chest lat]
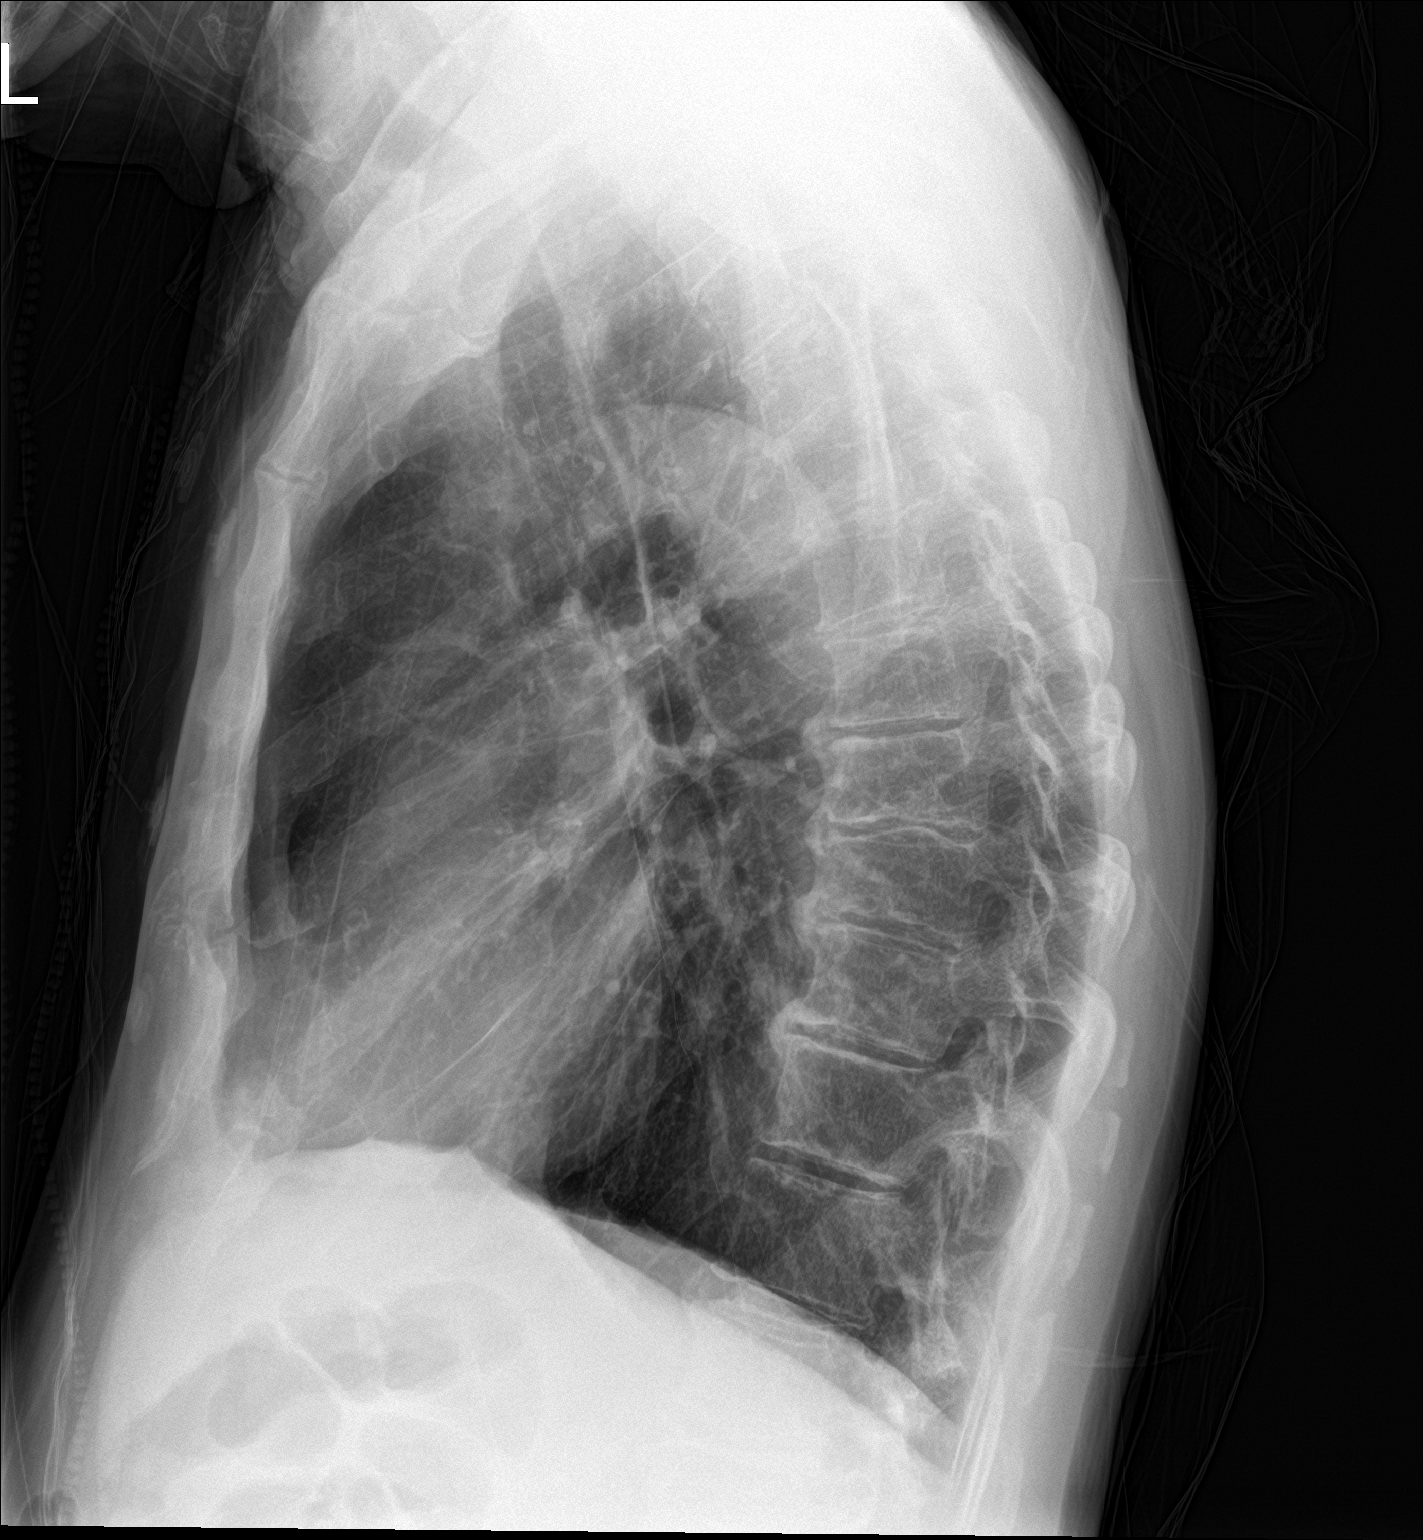

[2 of 2 positions shown; findings below may reference images not displayed]

FINDINGS: The lungs are hyperinflated. There is no evidence of acute
infiltrate, pleural effusion or pneumothorax. The heart size and
mediastinal contours are within normal limits. Both lungs are clear.
A chronic eighth left rib fracture is noted.
IMPRESSION: No active cardiopulmonary disease.
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
## 2022-06-21 NOTE — Congregational Nurse Program (Signed)
  Dept: [phone]   Congregational Nurse Program Note  Date of Encounter: 06/21/2022 Client to [facility] clinic for vital sign check and emotional support.  BP 122/78 (BP Location: Left Arm, Patient Position: Sitting, Cuff Size: Normal)   Pulse (!) 115   SpO2 97%  No new needs at this visit.   Past Medical History: Past Medical History:  Diagnosis Date   Hep C w/o coma, chronic ([place])    Hypertension    Polysubstance abuse ([place])     Encounter Details:  CNP Questionnaire - 06/21/22 1226       Questionnaire   Do you give verbal consent to treat you today? Yes    Location Patient [place] or Organization    Patient Status Homeless    Insurance [facility]    Insurance Referral N/A    Medication N/A    Medical Provider Yes   [facility], Dr. Rosario Jacks   Screening Referrals N/A    Medical Referral [place] Insecurities   needs to reapply for food stamps   Transportation N/A   needs intermittent transportation assistance   Housing/Utilities No permanent housing    Interpersonal Safety N/A    Intervention Blood pressure;Educate;Support;Counsel    ED Visit Averted N/A    Life-Saving Intervention Made N/A
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
## 2022-06-28 NOTE — Congregational Nurse Program (Signed)
  Dept: [phone]   Congregational Nurse Program Note  Date of Encounter: 06/28/2022 Client to [facility] clinic today with request for foot care. Nails trimmed and filed. Sunscreen applied to forehead as client will be outdoors the remainder of the day. Client appreciative of care provided. No other needs at this time. Past Medical History: Past Medical History:  Diagnosis Date   Hep C w/o coma, chronic ([place])    Hypertension    Polysubstance abuse ([place])     Encounter Details:  CNP Questionnaire - 06/28/22 1155       Questionnaire   Ask client: Do you give verbal consent for me to treat you today? Yes    Student Assistance [employer] [facility]    Location Patient [place]    Visit Setting with Client Organization    Patient Status Unhoused    Insurance [place]    Insurance/Financial Assistance Referral N/A    Medication N/A   currenlty not on any medications   Medical Provider Yes   [facility], Dr. Rosario Jacks   Screening Referrals Made N/A    Medical Referrals Made N/A    Medical Appointment Made N/A   client has upcoming PCP apt on 10/17   Recently w/o PCP, now 1st time PCP visit completed due to CNs referral or appointment made N/A   first saw new PCP in July   Food Have Food Insecurities   needs to reapply for food stamps   Transportation N/A   needs intermittent transportation assistance   Housing/Utilities No permanent housing    Interpersonal Safety N/A    Interventions Advocate/Support    Abnormal to Normal Screening Since Last CN Visit N/A    Screenings CN Performed N/A    Sent Client to Lab for: N/A    Did client attend any of the following based off CNs referral or appointments made? N/A   in July 2023   ED Visit Averted N/A    Life-Saving Intervention Made N/A      Questionnaire   Do you give verbal consent to treat you today? Yes    Location Patient [place] or Organization    Patient Status Homeless     Insurance [facility]    Insurance Referral N/A    Medication N/A    Screening Referrals N/A    Intervention Support

## 2022-07-04 NOTE — Congregational Nurse Program (Signed)
  Dept: [phone]   Congregational Nurse Program Note  Date of Encounter: 07/04/2022 Client to [facility] clinic for vital sign check and support. BP 122/76 (BP Location: Left Arm, Patient Position: Sitting, Cuff Size: Normal)   Pulse (!) 113   SpO2 95%  Client has an upcoming PCP apt on 10/17, he reports he wants to keep this appointment. RN to assist with transportation through [facility] transportation. No other needs at this time. Past Medical History: Past Medical History:  Diagnosis Date   Hep C w/o coma, chronic ([place])    Hypertension    Polysubstance abuse ([place])     Encounter Details:  CNP Questionnaire - 07/04/22 1030       Questionnaire   Ask client: Do you give verbal consent for me to treat you today? Yes    Student Assistance N/A    Location Patient [place]    Visit Setting with Client Organization    Patient Status Unhoused    Insurance Medicaid    Insurance/Financial Assistance Referral N/A    Medication N/A   currenlty not on any medications   Medical Provider Yes   [facility], Dr. Rosario Jacks   Screening Referrals Made N/A    Medical Referrals Made N/A    Medical Appointment Made N/A   client has upcoming PCP apt on 10/17   Recently w/o PCP, now 1st time PCP visit completed due to CNs referral or appointment made N/A   first saw new PCP in July   Food Have Food Insecurities   needs to reapply for food stamps   Transportation N/A   needs intermittent transportation assistance   Housing/Utilities No permanent housing    Interpersonal Safety N/A    Interventions Advocate/Support    Abnormal to Normal Screening Since Last CN Visit N/A    Screenings CN Performed Blood Pressure    Sent Client to Lab for: N/A    Did client attend any of the following based off CNs referral or appointments made? N/A   in July 2023   ED Visit Averted N/A    Life-Saving Intervention Made N/A      Questionnaire   Do you give verbal consent to treat you  today? Yes    Location Patient [place] or Organization    Patient Status Homeless    Insurance [facility]    Insurance Referral N/A    Medication N/A    Screening Referrals N/A    Intervention Support;Blood pressure

## 2022-07-10 NOTE — Congregational Nurse Program (Signed)
  Dept: [place] Nurse Program Note  Date of Encounter: 07/10/2022 Client to clinic for vital sign check and to request that his Md apt for today at 2 pm be cancelled. RN contacted Dr. Guerry Bruin office to cancel apt. Not rescheduled at this time at client request. RN will continue to provide education regarding the importance of keeping apts/regular health maintenance. Emotional support given. BP 132/78 (BP Location: Left Arm, Patient Position: Sitting, Cuff Size: Normal)   Pulse (!) 119   SpO2 98% .  Past Medical History: Past Medical History:  Diagnosis Date   Hep C w/o coma, chronic ([place])    Hypertension    Polysubstance abuse ([place])     Encounter Details:  CNP Questionnaire - 07/10/22 1208       Questionnaire   Ask client: Do you give verbal consent for me to treat you today? Yes    Student Assistance N/A    Location Patient [place]    Visit Setting with Client Organization    Patient Status Unhoused    Insurance Medicaid    Insurance/Financial Assistance Referral N/A    Medication N/A   currenlty not on any medications   Medical Provider Yes   [facility], Dr. Rosario Jacks   Screening Referrals Made N/A    Medical Referrals Made N/A    Medical Appointment Made N/A   client has upcoming PCP apt on 10/17, apt cancelled at client request. RN to reschedule at another visit   Recently w/o PCP, now 1st time PCP visit completed due to CNs referral or appointment made N/A   first saw new PCP in July   Food Have Food Insecurities   needs to reapply for food stamps   Transportation N/A   needs intermittent transportation assistance   Housing/Utilities No permanent housing    Interpersonal Safety N/A    Interventions Advocate/Support    Abnormal to Normal Screening Since Last CN Visit N/A    Screenings CN Performed Blood Pressure    Sent Client to Lab for: N/A    Did client attend any of the following based off CNs referral or appointments made?  N/A   in July 2023   ED Visit Averted N/A    Life-Saving Intervention Made N/A      Questionnaire   Do you give verbal consent to treat you today? Yes    Location Patient [place] or Organization    Patient Status Homeless    Insurance [facility]    Insurance Referral N/A    Medication N/A    Screening Referrals N/A    Intervention Support;Blood pressure

## 2022-07-11 NOTE — Congregational Nurse Program (Signed)
  Dept: [phone]   Congregational Nurse Program Note  Date of Encounter: 07/11/2022 Client to [facility] clinic for vital sign check and support.  BP 128/68 (BP Location: Left Arm, Patient Position: Sitting, Cuff Size: Normal)   Pulse (!) 124   SpO2 98% .  No new concerns or needs at this time.  Past Medical History: Past Medical History:  Diagnosis Date   Hep C w/o coma, chronic ([place])    Hypertension    Polysubstance abuse ([place])     Encounter Details:  CNP Questionnaire - 07/11/22 1153       Questionnaire   Ask client: Do you give verbal consent for me to treat you today? Yes    Student Assistance N/A    Location Patient [place]    Visit Setting with Client Organization    Patient Status Unhoused    Insurance Medicaid    Insurance/Financial Assistance Referral N/A    Medication N/A   currenlty not on any medications   Medical Provider Yes   [facility], Dr. Rosario Jacks   Screening Referrals Made N/A    Medical Referrals Made N/A    Medical Appointment Made N/A   client has upcoming PCP apt on 10/17, apt cancelled at client request. RN to reschedule at another visit   Recently w/o PCP, now 1st time PCP visit completed due to CNs referral or appointment made N/A   first saw new PCP in July   Food Have Food Insecurities   needs to reapply for food stamps   Transportation N/A   needs intermittent transportation assistance   Housing/Utilities No permanent housing    Interpersonal Safety N/A    Interventions Advocate/Support    Abnormal to Normal Screening Since Last CN Visit N/A    Screenings CN Performed Blood Pressure    Sent Client to Lab for: N/A    Did client attend any of the following based off CNs referral or appointments made? N/A   in July 2023   ED Visit Averted N/A    Life-Saving Intervention Made N/A      Questionnaire   Do you give verbal consent to treat you today? Yes    Location Patient [place] or Organization    Patient Status Homeless    Insurance [facility]    Insurance Referral N/A    Medication N/A    Screening Referrals N/A    Intervention Support;Blood pressure
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
## 2022-07-17 NOTE — Congregational Nurse Program (Signed)
  Dept: [phone]   Congregational Nurse Program Note  Date of Encounter: 07/17/2022 Client to [facility] clinic for vital sign check and support. He declined to reschedule his PCP apt. BP 128/68 (BP Location: Left Arm, Patient Position: Sitting, Cuff Size: Normal)   Pulse (!) 119   SpO2 95% . Pulse remains elevated. RN provided education regarding importance of hydration and also encouragement to reschedule his missed PCP apt. No other needs at this time.  Past Medical History: Past Medical History:  Diagnosis Date   Hep C w/o coma, chronic ([place])    Hypertension    Polysubstance abuse ([place])     Encounter Details:  CNP Questionnaire - 07/17/22 1202       Questionnaire   Ask client: Do you give verbal consent for me to treat you today? Yes    Student Assistance N/A    Location Patient [place]    Visit Setting with Client Organization    Patient Status Unhoused    Insurance Medicaid    Insurance/Financial Assistance Referral N/A    Medication N/A   currenlty not on any medications   Medical Provider Yes   [facility], Dr. Rosario Jacks   Screening Referrals Made N/A    Medical Referrals Made N/A    Medical Appointment Made N/A   client has upcoming PCP apt on 10/17, apt cancelled at client request. client agreeable to RN rescheduling visit next week.   Recently w/o PCP, now 1st time PCP visit completed due to CNs referral or appointment made N/A   first saw new PCP in July   Food Have Food Insecurities   needs to reapply for food stamps   Transportation N/A   needs intermittent transportation assistance   Housing/Utilities No permanent housing    Interpersonal Safety N/A    Interventions Advocate/Support    Abnormal to Normal Screening Since Last CN Visit N/A   elevated pulse for the last 3 visits   Screenings CN Performed Blood Pressure    Sent Client to Lab for: N/A    Did client attend any of the following based off CNs referral or appointments made?  N/A   in July 2023   ED Visit Averted N/A    Life-Saving Intervention Made N/A      Questionnaire   Do you give verbal consent to treat you today? Yes    Location Patient [place] or Organization    Patient Status Homeless    Insurance [facility]    Insurance Referral N/A    Medication N/A    Screening Referrals N/A    Intervention Support;Blood pressure
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
## 2022-07-19 NOTE — Congregational Nurse Program (Signed)
  Dept: [phone]   Congregational Nurse Program Note  Date of Encounter: 07/19/2022 Client to [facility] clinic for vital sign check and support. BP (!) 150/98 (BP Location: Left Arm, Patient Position: Sitting, Cuff Size: Normal)   Pulse 91   SpO2 98%  Pulse WNL today, BP elevated. Again discussed need for hydration other than coffee. Client was able to stay at a friends house overnight, shower and wash his clothes. Support given, no other needs at this time.  Past Medical History: Past Medical History:  Diagnosis Date   Hep C w/o coma, chronic ([place])    Hypertension    Polysubstance abuse ([place])     Encounter Details:  CNP Questionnaire - 07/19/22 0945       Questionnaire   Ask client: Do you give verbal consent for me to treat you today? Yes    Student Assistance N/A    Location Patient [place]    Visit Setting with Client Organization    Patient Status Unhoused    Insurance Medicaid    Insurance/Financial Assistance Referral N/A    Medication N/A   currenlty not on any medications   Medical Provider Yes   [facility], Dr. Rosario Jacks   Screening Referrals Made N/A    Medical Referrals Made N/A    Medical Appointment Made N/A   client has upcoming PCP apt on 10/17, apt cancelled at client request. client agreeable to RN rescheduling visit next week.   Recently w/o PCP, now 1st time PCP visit completed due to CNs referral or appointment made N/A   first saw new PCP in July   Food Have Food Insecurities   needs to reapply for food stamps   Transportation N/A   needs intermittent transportation assistance   Housing/Utilities No permanent housing    Interpersonal Safety N/A    Interventions Advocate/Support    Abnormal to Normal Screening Since Last CN Visit N/A   elevated pulse for the last 3 visits   Screenings CN Performed Blood Pressure;Pulse Ox    Sent Client to Lab for: N/A    Did client attend any of the following based off CNs referral or  appointments made? N/A   in July 2023   ED Visit Averted N/A    Life-Saving Intervention Made N/A      Questionnaire   Do you give verbal consent to treat you today? Yes    Location Patient [place] or Organization    Patient Status Homeless    Insurance [facility]    Insurance Referral N/A    Medication N/A    Screening Referrals N/A    Intervention Support;Blood pressure
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
## 2022-07-31 NOTE — Congregational Nurse Program (Signed)
  Dept: [place] Nurse Program Note  Date of Encounter: 07/31/2022 Client to [facility] with request for skin care. 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 RN has provided sun protection education and sunscreen, multiple times. Skin care provided, support givne. Past Medical History: Past Medical History:  Diagnosis Date   Hep C w/o coma, chronic ([place])    Hypertension    Polysubstance abuse ([place])     Encounter Details:  CNP Questionnaire - 07/31/22 1603       Questionnaire   Ask client: Do you give verbal consent for me to treat you today? Yes    Student Assistance N/A    Location Patient [place]    Visit Setting with Client Organization    Patient Status Unhoused    Insurance Medicaid    Insurance/Financial Assistance Referral N/A    Medication N/A    Medical Provider Yes    Screening Referrals Made N/A    Medical Referrals Made N/A    Medical Appointment Made N/A    Recently w/o PCP, now 1st time PCP visit completed due to CNs referral or appointment made N/A    Food Have Food Insecurities    Transportation N/A    Housing/Utilities No permanent housing    Interpersonal Safety N/A    Interventions Advocate/Support    Abnormal to Normal Screening Since Last CN Visit N/A    Screenings CN Performed N/A    Sent Client to Lab for: N/A    Did client attend any of the following based off CNs referral or appointments made? N/A    ED Visit Averted N/A    Life-Saving Intervention Made N/A

## 2022-08-01 IMAGING — CR DG CHEST 2V
2 series · 2 of 2 positions shown · non-contrast
Comparison: Chest radiograph dated 06/24/2021.

CLINICAL DATA: Chest pain.

EXAM:
CHEST - 2 VIEW

[chest pa]
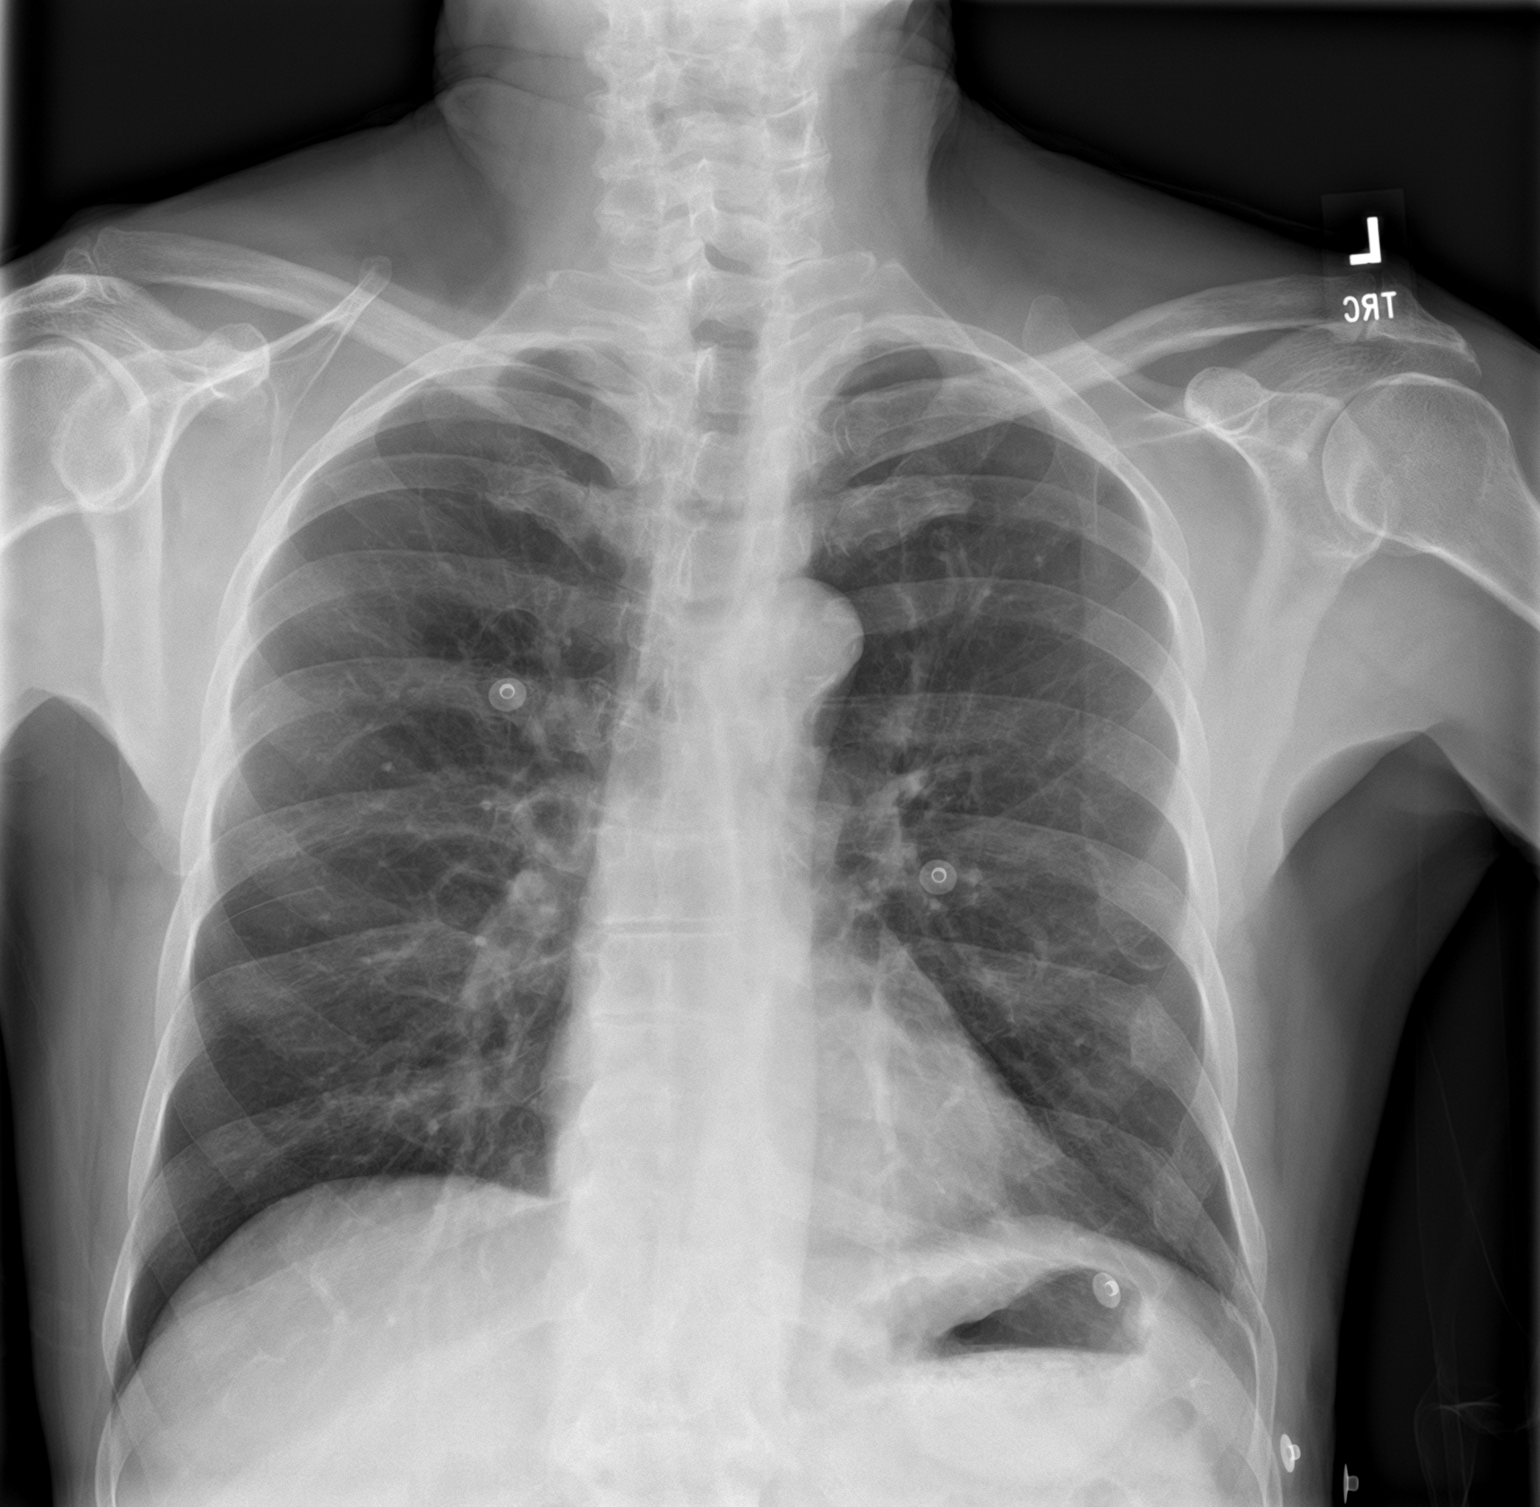

[chest lat]
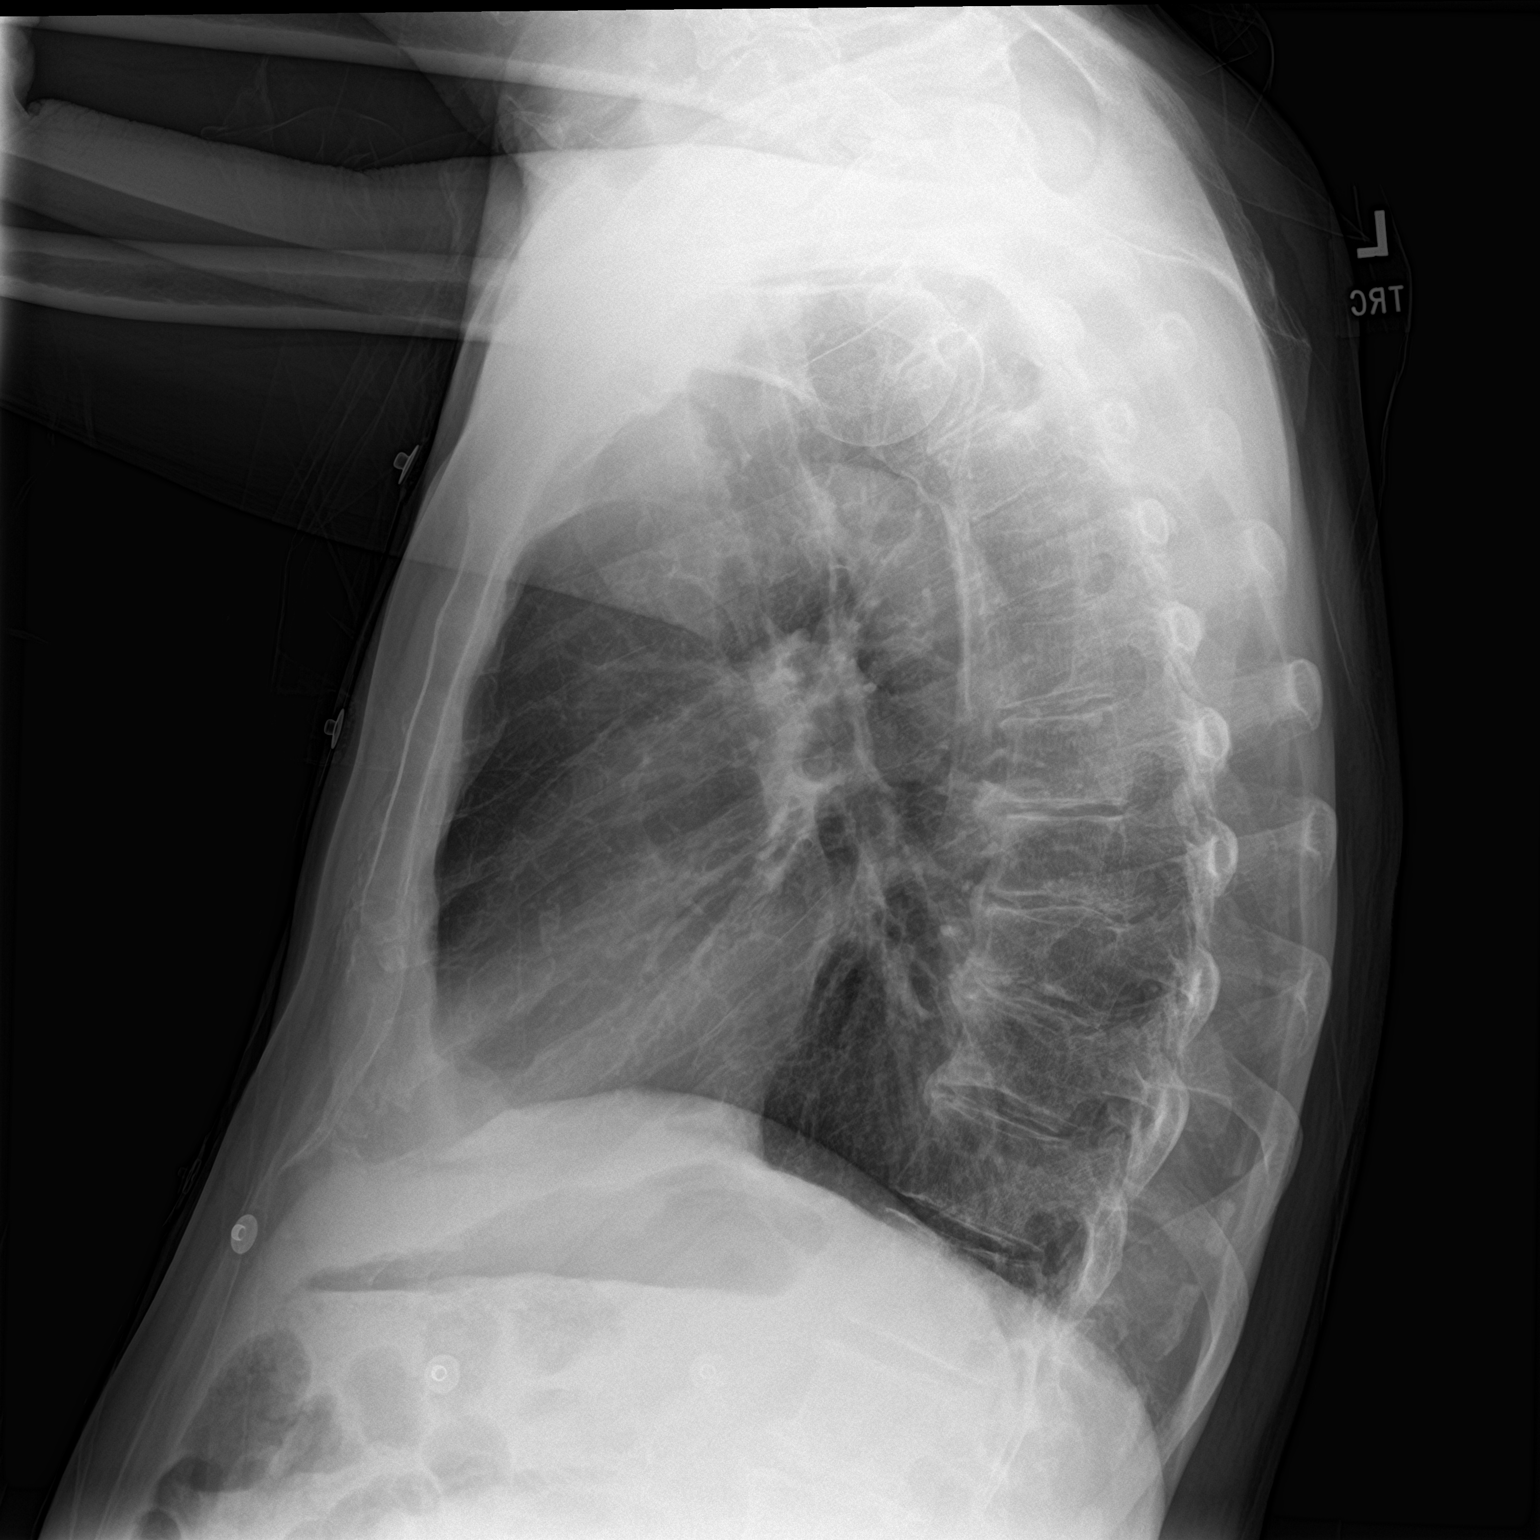

[2 of 2 positions shown; findings below may reference images not displayed]

FINDINGS: No focal consolidation, pleural effusion, pneumothorax. The cardiac
silhouette is within limits. Atherosclerotic calcification of the
aorta. Osteopenia with degenerative changes of the spine. Old healed
left rib fractures. No acute osseous pathology.
IMPRESSION: No active cardiopulmonary disease.

## 2022-08-01 NOTE — Congregational Nurse Program (Signed)
  Dept: [phone]   Congregational Nurse Program Note  Date of Encounter: 08/01/2022 Client to [facility] day center for vial sign check and support. BP 118/72 (BP Location: Left Arm, Patient Position: Sitting, Cuff Size: Normal)   Pulse 99   SpO2 96% . Emotional support given, no other needs at this time.  Past Medical History: Past Medical History:  Diagnosis Date   Hep C w/o coma, chronic ([facility])    Hypertension    Polysubstance abuse ([facility])     Encounter Details:  CNP Questionnaire - 08/01/22 1056       Questionnaire   Ask client: Do you give verbal consent for me to treat you today? Yes    Student Assistance N/A    Location Patient Served  [facility]    Visit Setting with Client Organization    Patient Status Unhoused    Insurance Medicaid    Insurance/Financial Assistance Referral N/A    Medication N/A    Medical Provider Yes    Screening Referrals Made N/A    Medical Referrals Made N/A    Medical Appointment Made N/A    Recently w/o PCP, now 1st time PCP visit completed due to CNs referral or appointment made N/A    Food Have Food Insecurities    Transportation N/A    Housing/Utilities No permanent housing    Interpersonal Safety N/A    Interventions Advocate/Support    Abnormal to Normal Screening Since Last CN Visit N/A    Screenings CN Performed Blood Pressure;Pulse Ox    Sent Client to Lab for: N/A    Did client attend any of the following based off CNs referral or appointments made? N/A    ED Visit Averted N/A    Life-Saving Intervention Made N/A      Questionnaire   Do you give verbal consent to treat you today? Yes    Location Patient Served  [name]    Visit Setting [facility] or Organization    Patient Status Homeless    Insurance [facility]    Insurance Referral N/A    Medication N/A    Screening Referrals N/A    Intervention Support;Blood pressure

## 2022-08-02 IMAGING — DX DG TIBIA/FIBULA 2V*L*
4 series · 4 of 4 positions shown · non-contrast
Comparison: None.

CLINICAL DATA: Recent fall with left leg pain, initial encounter

EXAM:
LEFT TIBIA AND FIBULA - 2 VIEW

[tibia ap (1 of 2)]
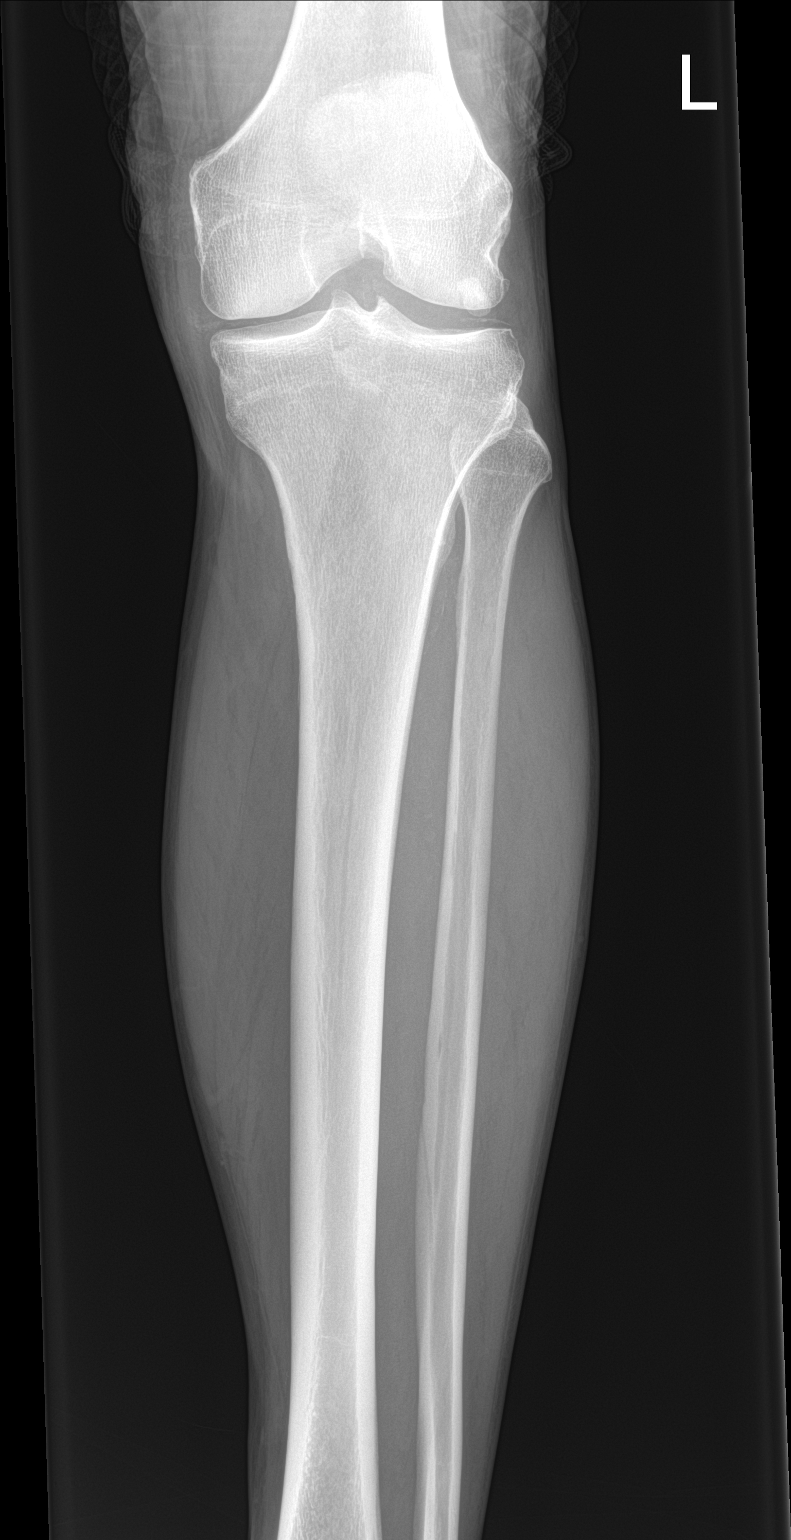

[tibia ap (2 of 2)]
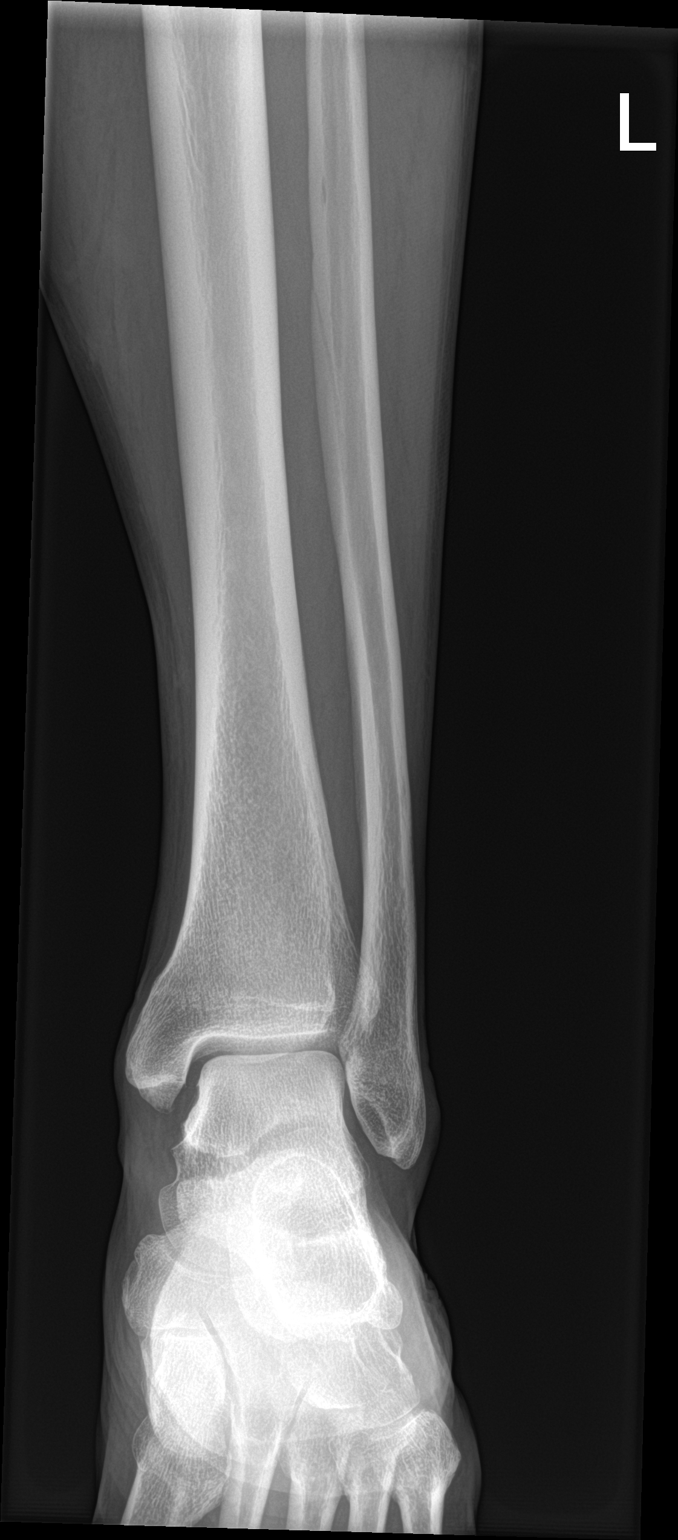

[tibia lat (1 of 2)]
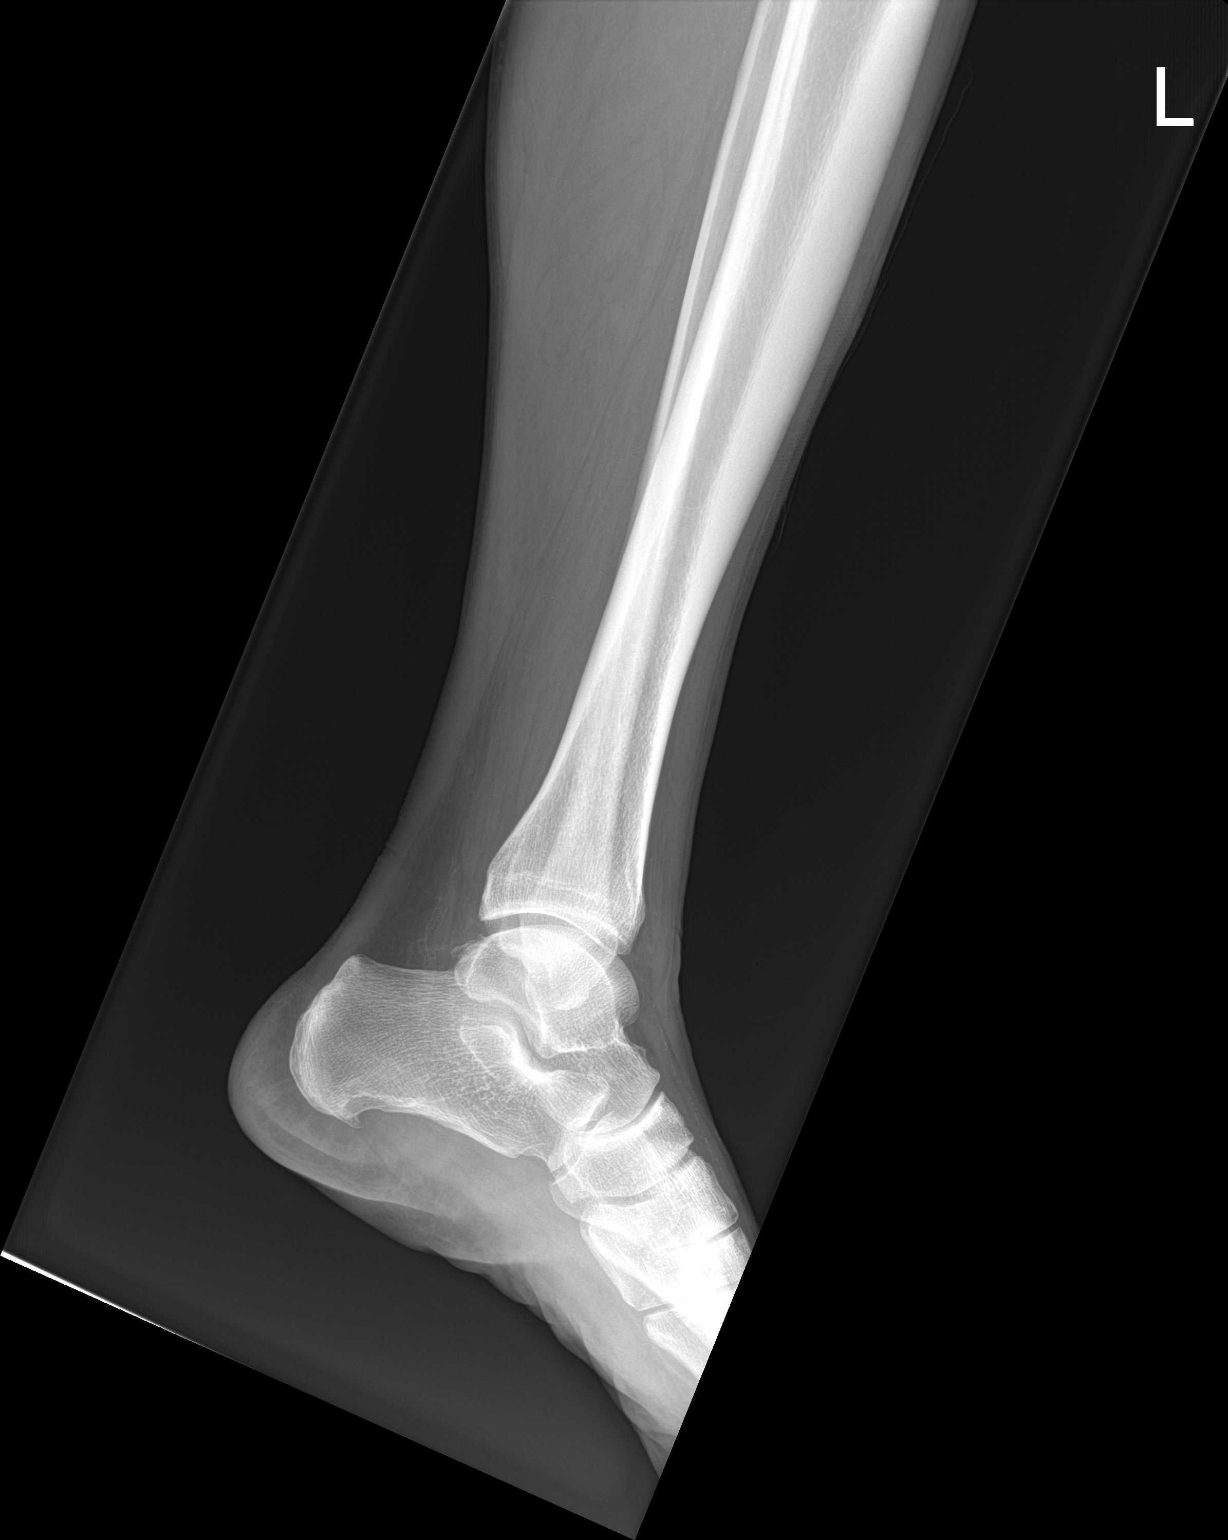

[tibia lat (2 of 2)]
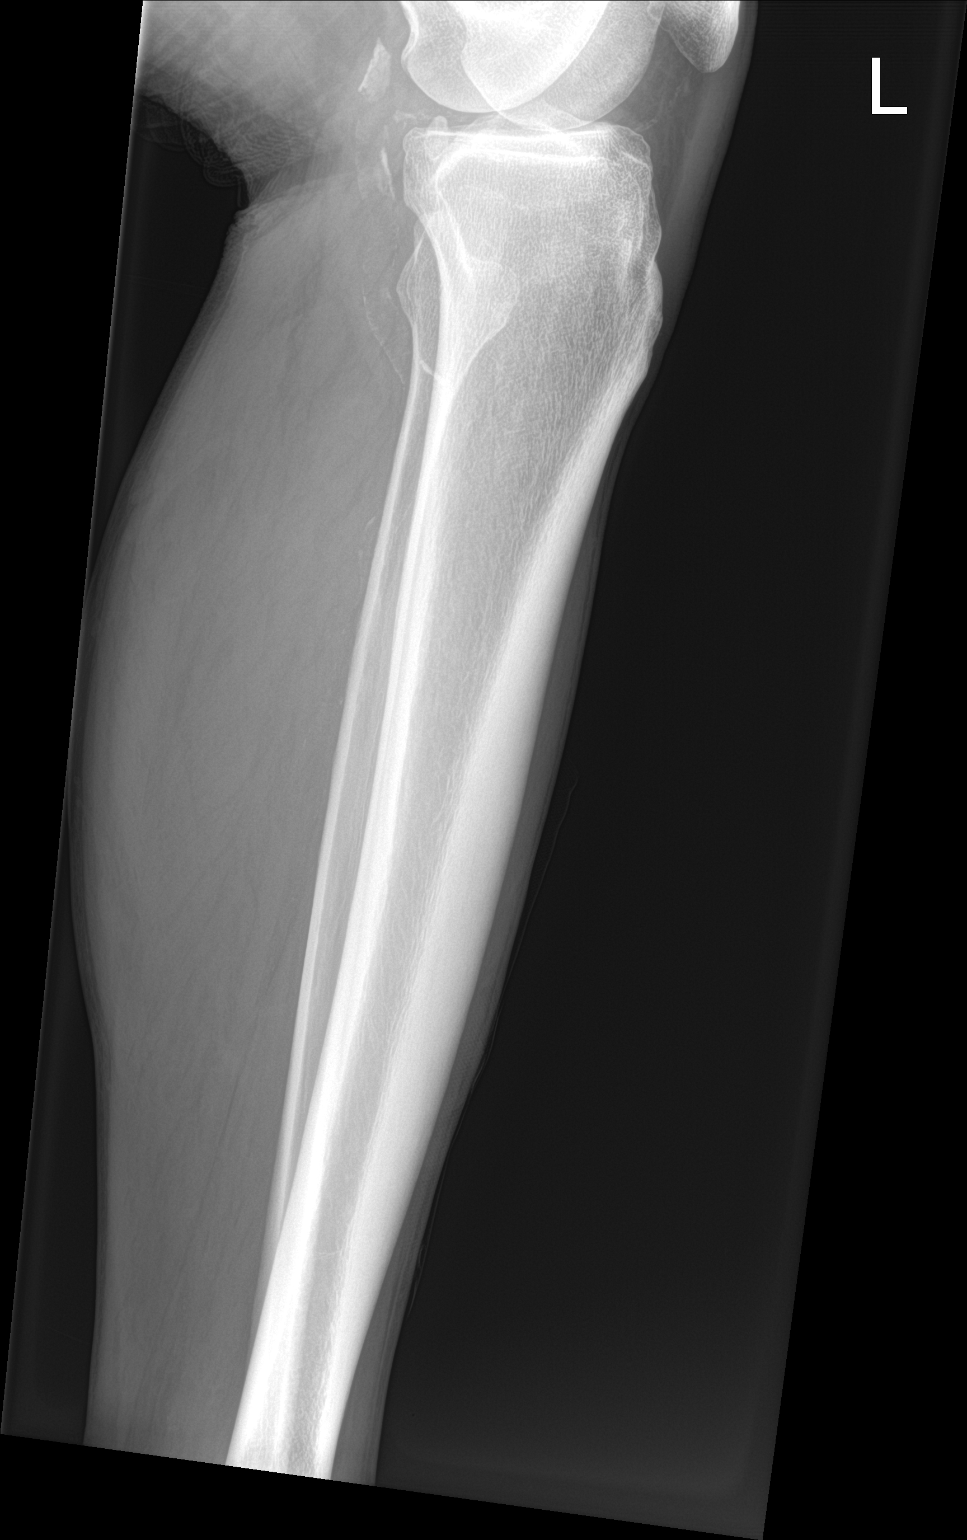

[4 of 4 positions shown; findings below may reference images not displayed]

FINDINGS: There is no evidence of fracture or other focal bone lesions. Soft
tissues are unremarkable.Vascular calcifications are noted in the
popliteal artery.
IMPRESSION: No acute abnormality noted.

## 2022-08-02 IMAGING — CR DG CHEST 2V
2 series · 2 of 2 positions shown · non-contrast
Comparison: 08/15/2021

CLINICAL DATA: Chest pain

EXAM:
CHEST - 2 VIEW

[chest pa]
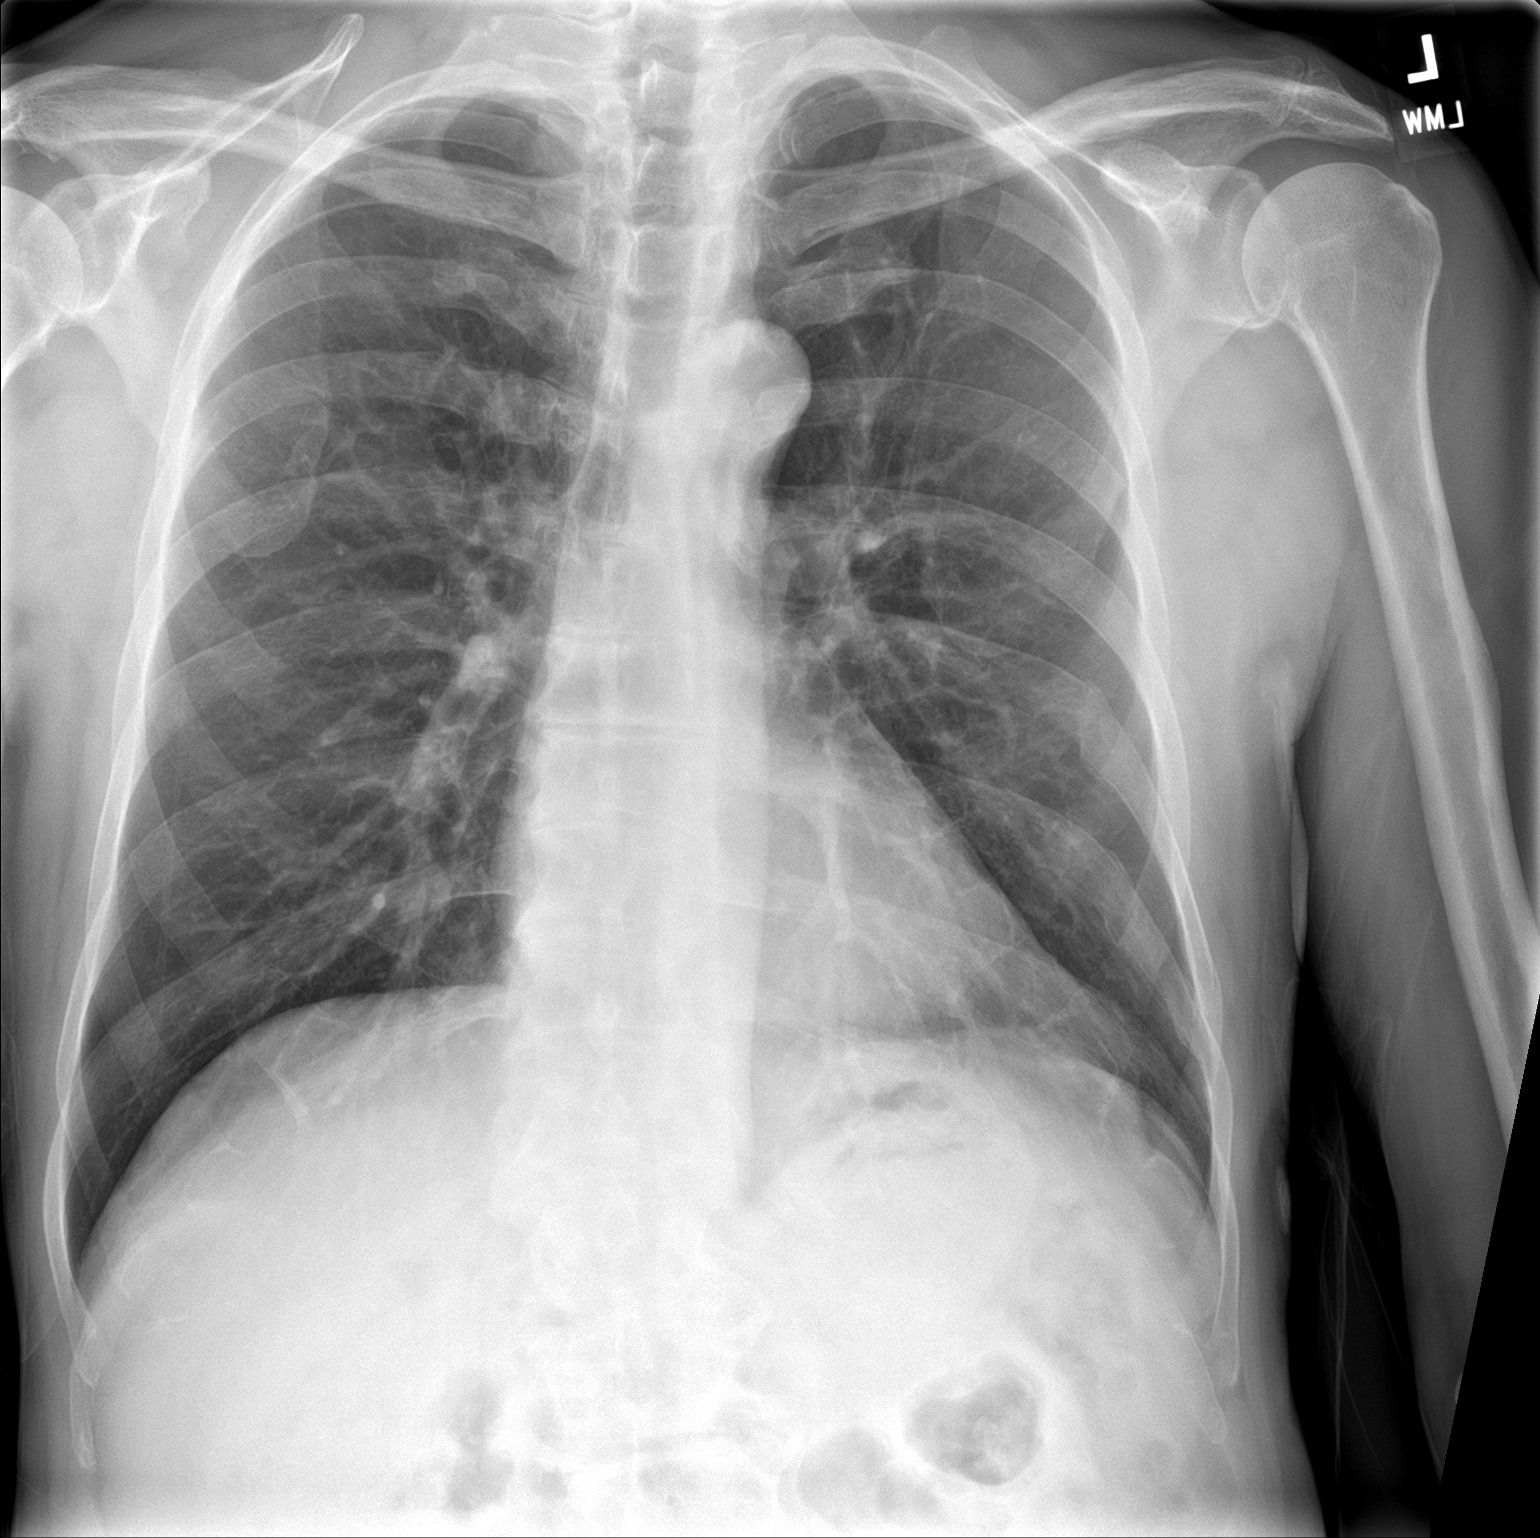

[chest lat]
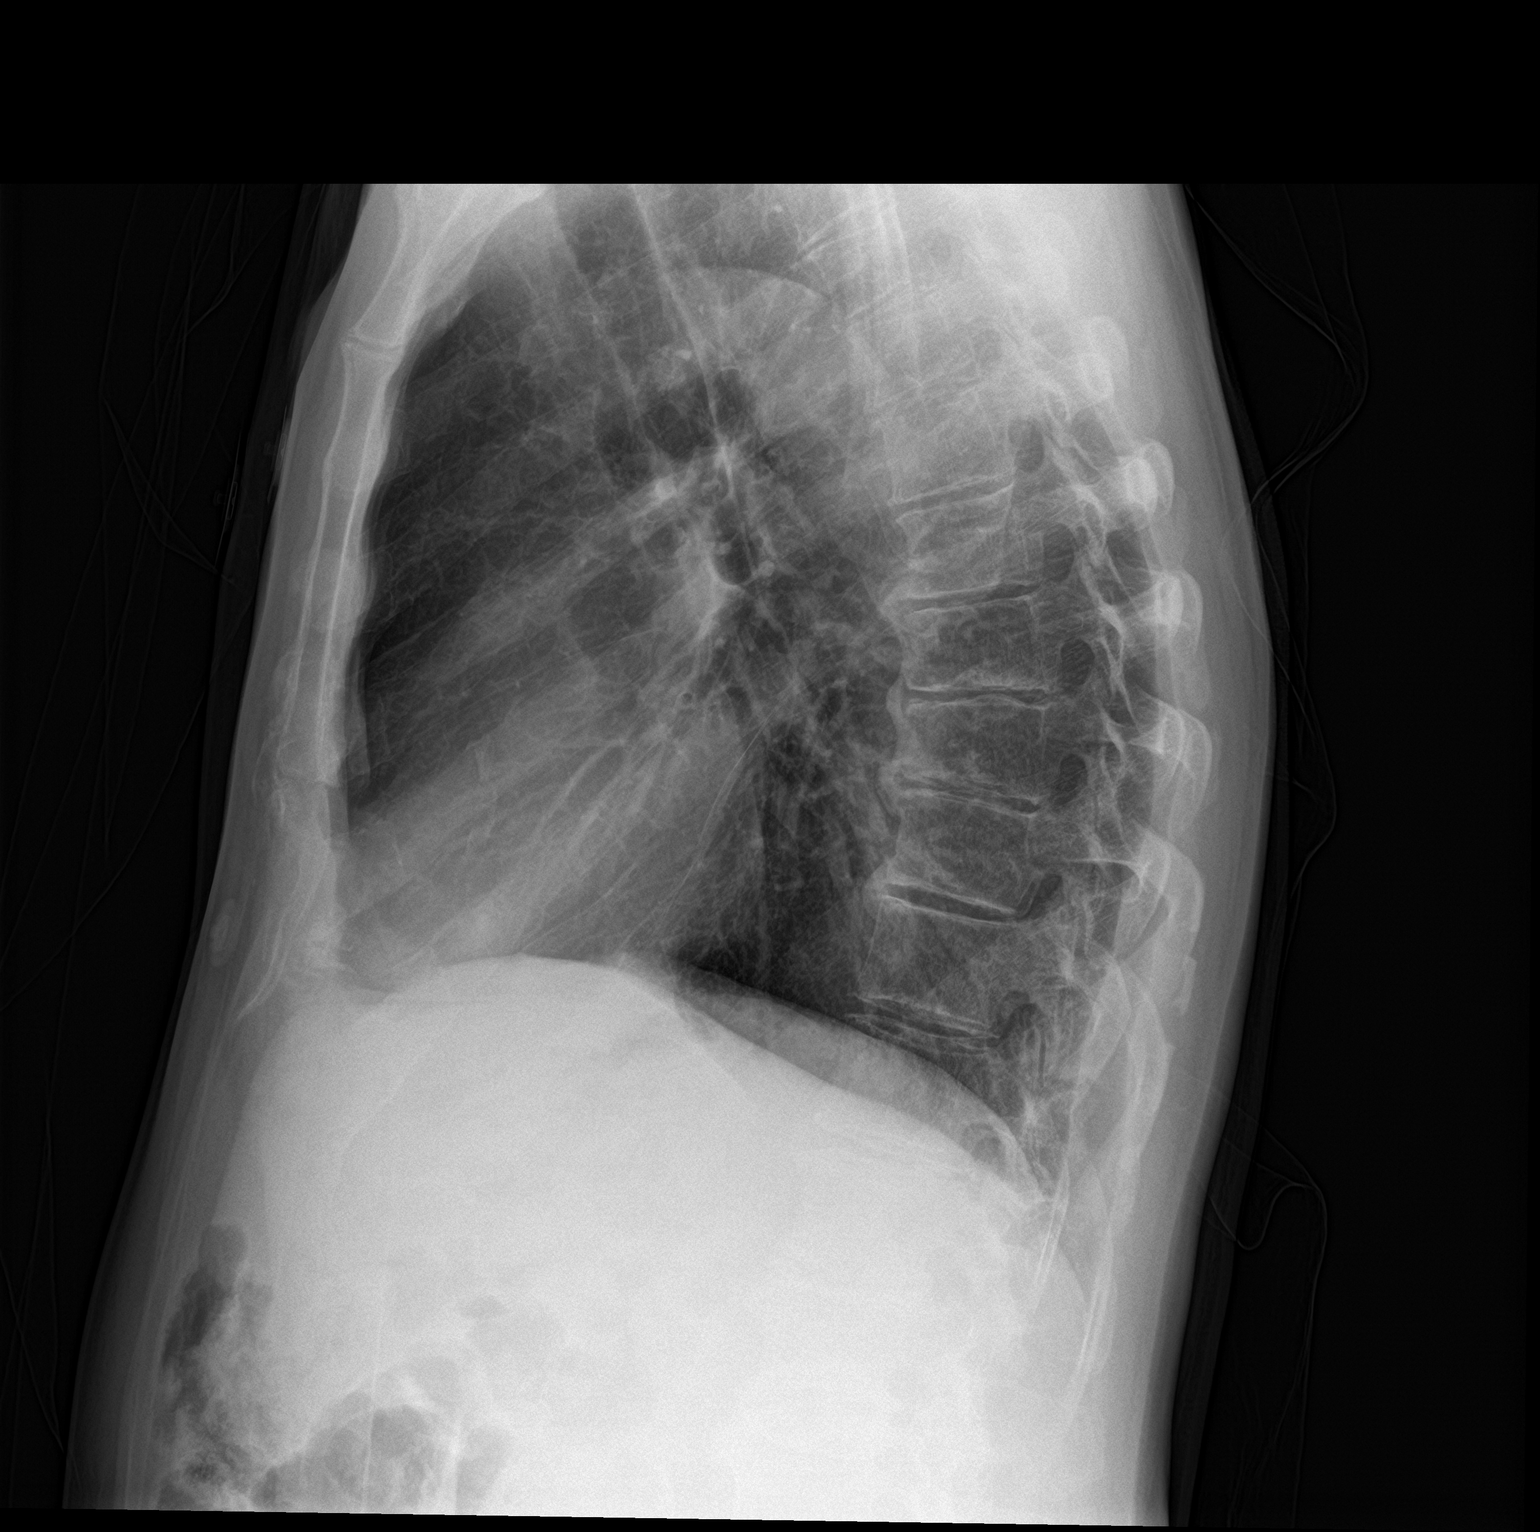

[2 of 2 positions shown; findings below may reference images not displayed]

FINDINGS: The heart size and mediastinal contours are within normal limits.
Both lungs are clear. Old appearing left-sided rib fractures.
IMPRESSION: No active cardiopulmonary disease.

## 2022-08-02 IMAGING — CT CT ABD-PELV W/ CM
2 of 5 series · 16 of 46 positions shown, 18 images · IV contrast (APPLIED)
Comparison: 06/25/2021

CLINICAL DATA: Acute and nonlocalized abdominal pain.

EXAM:
CT ABDOMEN AND PELVIS WITH CONTRAST
TECHNIQUE: Multidetector CT imaging of the abdomen and pelvis was performed
using the standard protocol following bolus administration of
intravenous contrast.
CONTRAST:  100mL OMNIPAQUE IOHEXOL 300 MG/ML  SOLN

[Series 2: routine abd/pel with · axial · 0.82mm/px · z∈[-509,-109]mm · 13 of 92 slices shown, 15 images]
[im 6/92  soft-tissue]
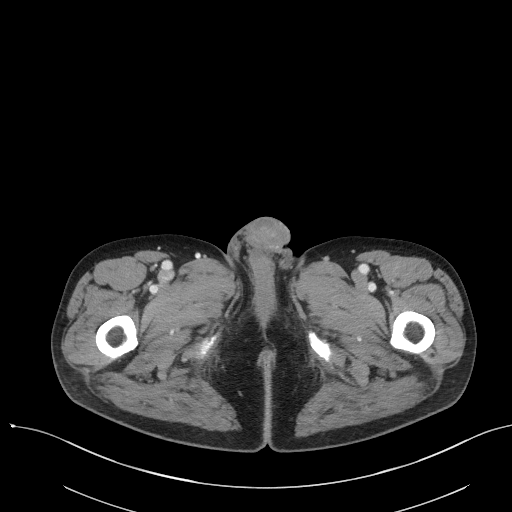
[im 6/92  bone]
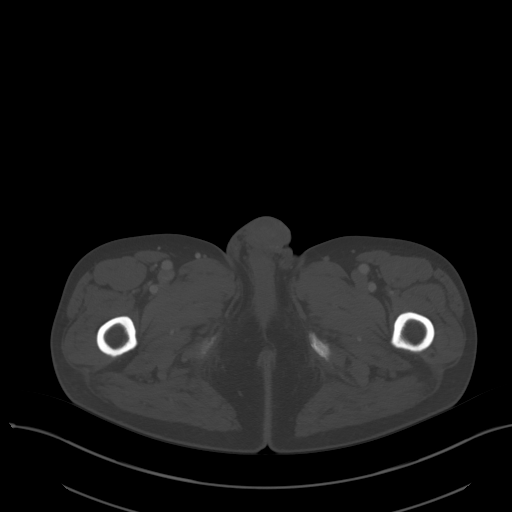
[im 11/92  soft-tissue]
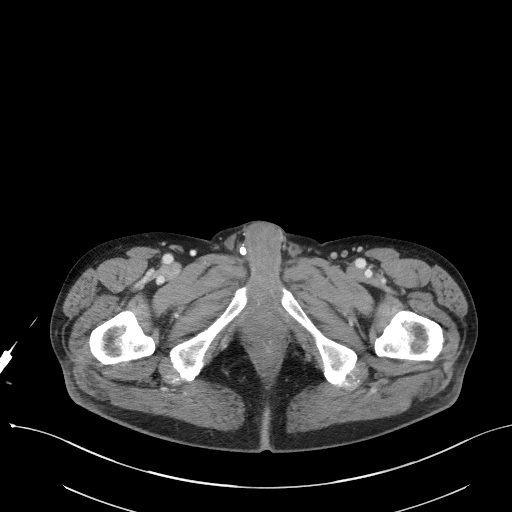
[im 21/92  soft-tissue]
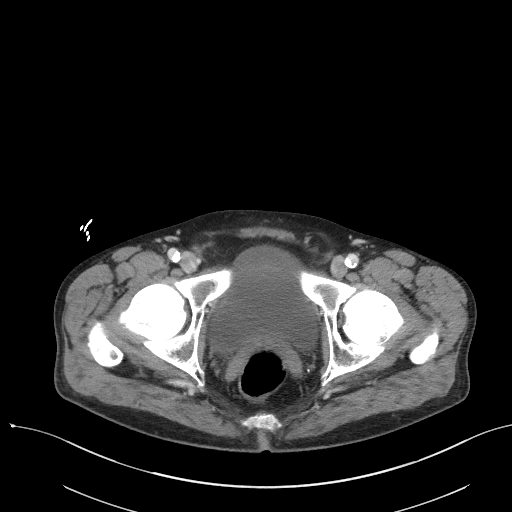
[im 26/92  soft-tissue]
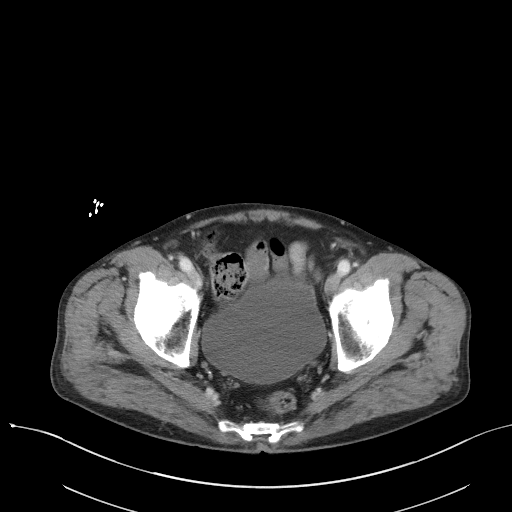
[im 31/92  soft-tissue]
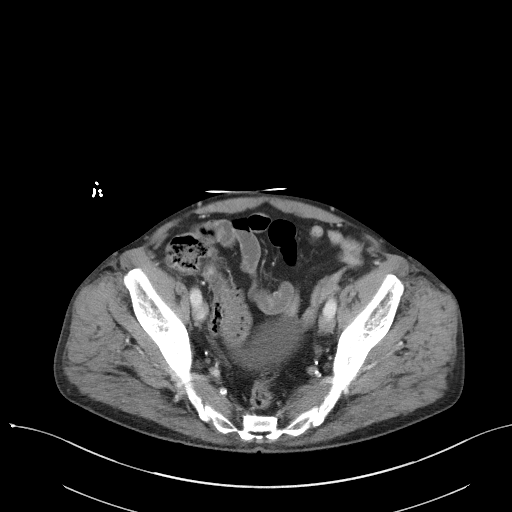
[im 41/92  soft-tissue]
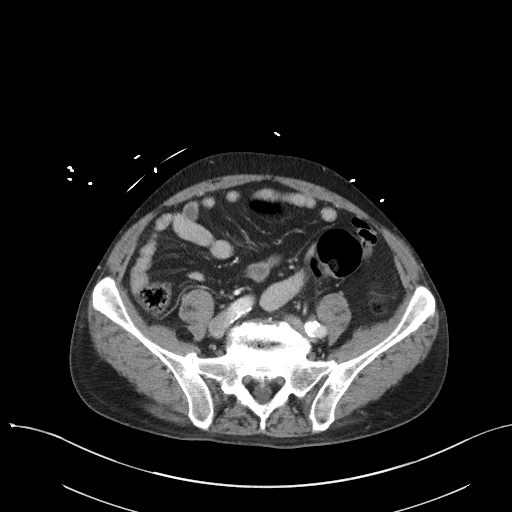
[im 46/92  soft-tissue]
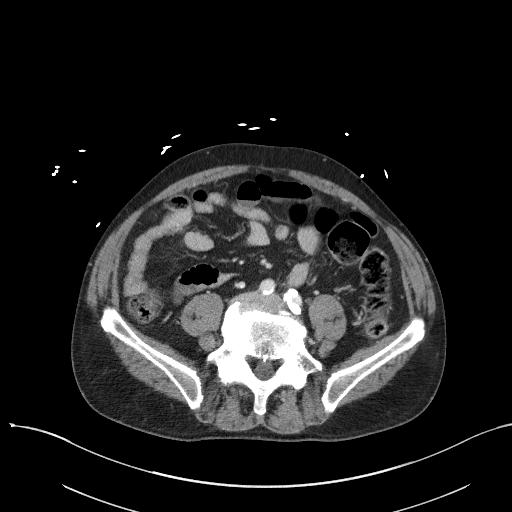
[im 51/92  soft-tissue]
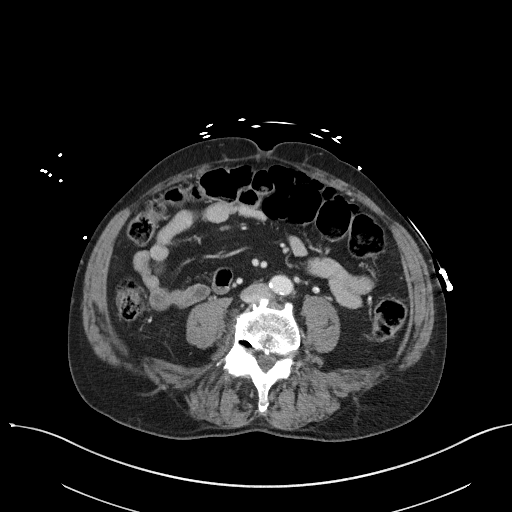
[im 61/92  soft-tissue]
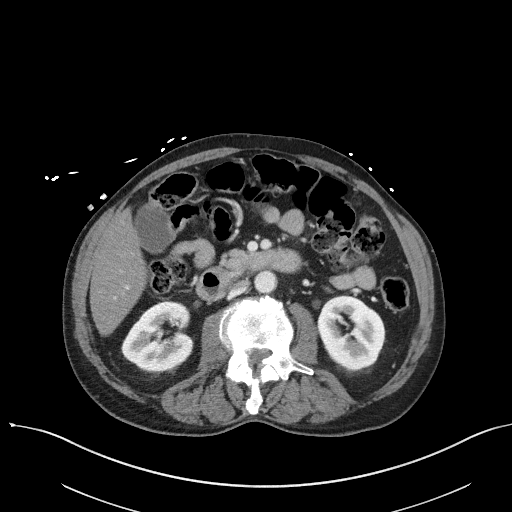
[im 61/92  bone]
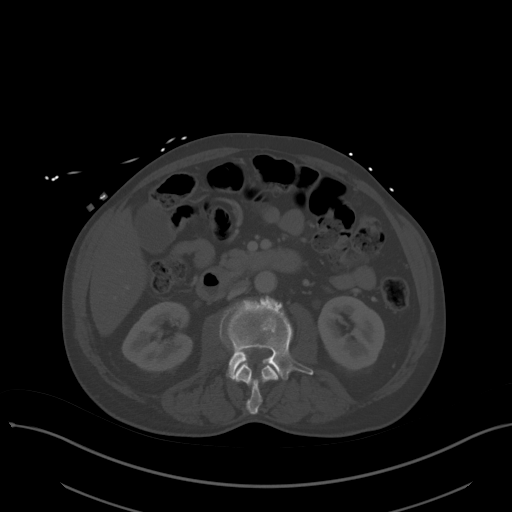
[im 66/92  soft-tissue]
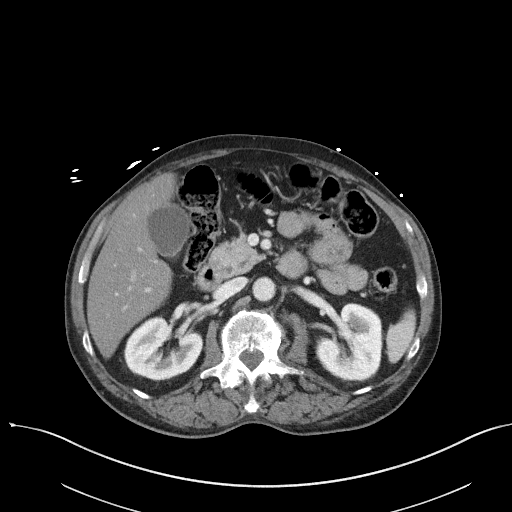
[im 71/92  soft-tissue]
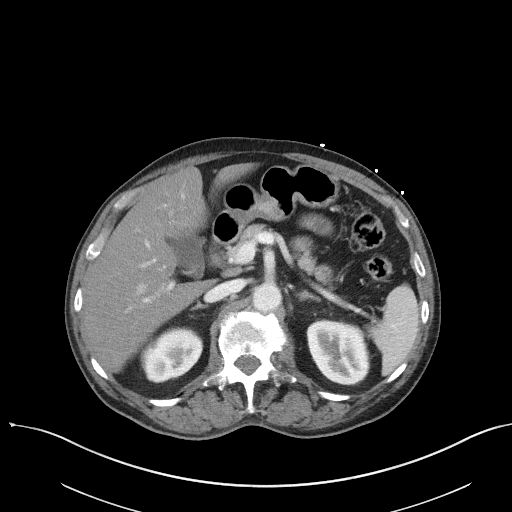
[im 81/92  soft-tissue]
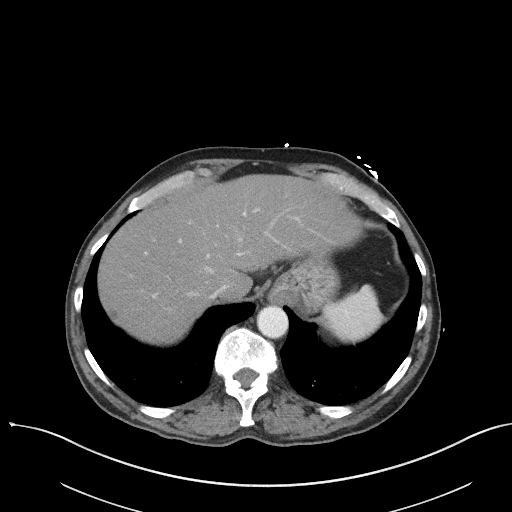
[im 86/92  soft-tissue]
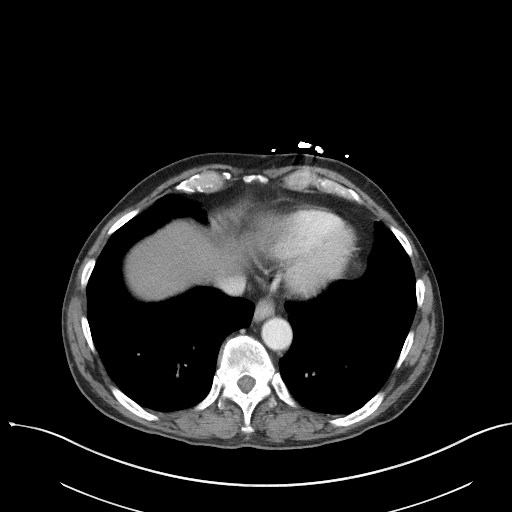

[Series 5: coronal st · coronal · 0.69mm/px · 3 of 88 slices shown]
[im 30/88  soft-tissue]
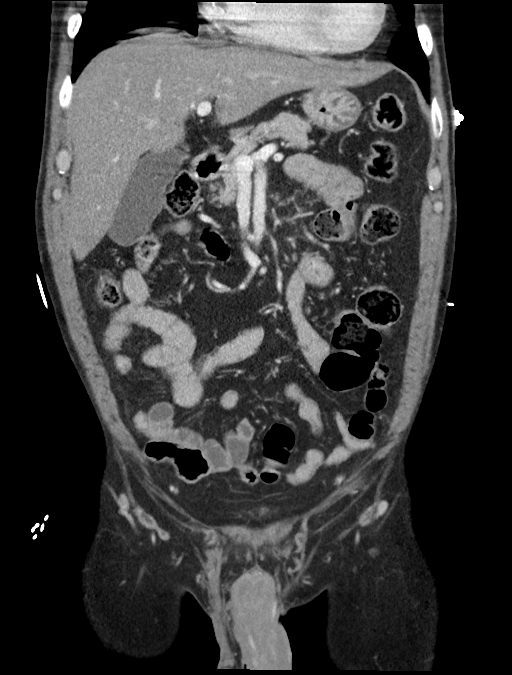
[im 39/88  soft-tissue]
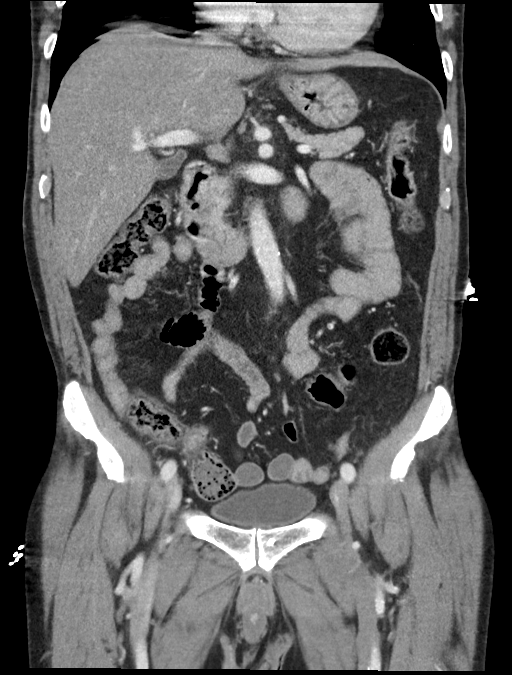
[im 49/88  soft-tissue]
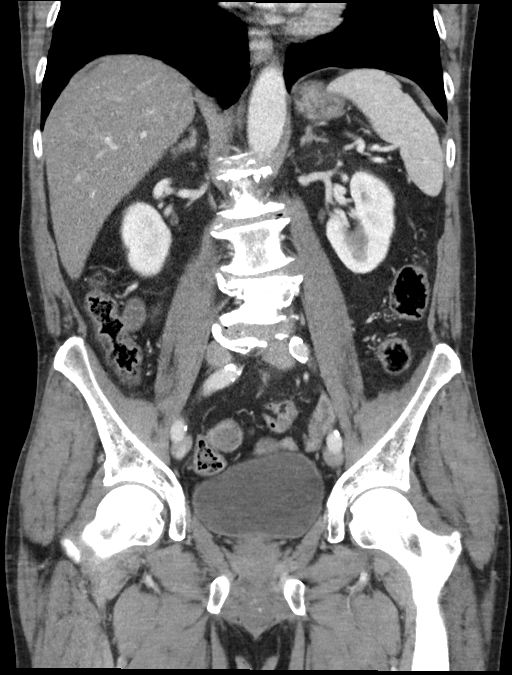

[16 of 46 positions shown; findings below may reference images not displayed]

FINDINGS: Lower chest:  Coronary atherosclerosis.

Hepatobiliary: Hepatic steatosis.No evidence of biliary obstruction
or stone.

Pancreas: Unremarkable.

Spleen: Unremarkable.

Adrenals/Urinary Tract: Negative adrenals. No hydronephrosis or
stone. Unremarkable bladder.

Stomach/Bowel:  No obstruction. No visible bowel inflammation.

Vascular/Lymphatic: No acute vascular abnormality. Extensive
atheromatous plaque. No mass or adenopathy.

Reproductive:No pathologic findings.

Other: No ascites or pneumoperitoneum.

Musculoskeletal: No acute abnormalities. Severe lumbar spine
degeneration with mild scoliosis. Gas containing herniation at the
right L4-5 foramen causing severe right foraminal impingement.
IMPRESSION: 1. No acute finding.
2. Advanced atherosclerosis which includes the coronary arteries.
3. Hepatic steatosis

## 2022-08-02 NOTE — Congregational Nurse Program (Signed)
  Dept: [phone]   Congregational Nurse Program Note  Date of Encounter: 08/02/2022 Client to [facility] day center for vital sign check. He reports right hip pain, which is chronic. Instructed on Ibuprofen use. Client took after eating here at the clinic.  BP (!) 142/88 (BP Location: Left Arm, Patient Position: Sitting, Cuff Size: Normal)   Pulse 91   SpO2 98% . Flu vaccination administered, education provided and patient had no immediate side effects. No other needs at this time.  Past Medical History: Past Medical History:  Diagnosis Date   Hep C w/o coma, chronic (HCC)    Hypertension    Polysubstance abuse (HCC)     Encounter Details:  CNP Questionnaire - 08/02/22 1046       Questionnaire   Ask client: Do you give verbal consent for me to treat you today? Yes    Student Assistance N/A    Location Patient Served  [facility]    Visit Setting with Client Organization    Patient Status Unhoused    Insurance Medicaid    Insurance/Financial Assistance Referral N/A    Medication N/A    Medical Provider Yes    Screening Referrals Made N/A    Medical Referrals Made N/A    Medical Appointment Made N/A    Recently w/o PCP, now 1st time PCP visit completed due to CNs referral or appointment made N/A    Food Have Food Insecurities    Transportation N/A    Housing/Utilities No permanent housing    Interpersonal Safety N/A    Interventions Advocate/Support    Abnormal to Normal Screening Since Last CN Visit N/A    Screenings CN Performed Blood Pressure;Pulse Ox    Sent Client to Lab for: N/A    Did client attend any of the following based off CNs referral or appointments made? N/A    ED Visit Averted N/A    Life-Saving Intervention Made N/A      Questionnaire   Do you give verbal consent to treat you today? Yes    Location Patient Served  [name]    Visit Setting [facility] or Organization    Patient Status Homeless    Insurance [facility]    Insurance Referral  N/A    Medication N/A    Screening Referrals N/A    Intervention Support;Blood pressure
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
## 2022-08-09 NOTE — Congregational Nurse Program (Signed)
  Dept: [phone]   Congregational Nurse Program Note  Date of Encounter: 08/09/2022 Client to [facility] day center for vital sign check and support. No new concerns or needs.  BP (!) 148/88 (BP Location: Left Arm, Patient Position: Sitting, Cuff Size: Normal)   Pulse 95   SpO2 100%   Past Medical History: Past Medical History:  Diagnosis Date   Hep C w/o coma, chronic (HCC)    Hypertension    Polysubstance abuse (HCC)     Encounter Details:  CNP Questionnaire - 08/09/22 1146       Questionnaire   Ask client: Do you give verbal consent for me to treat you today? Yes    Student Assistance N/A    Location Patient Served  [facility]    Visit Setting with Client Organization    Patient Status Unhoused    Insurance Medicaid    Insurance/Financial Assistance Referral N/A    Medication N/A    Medical Provider Yes    Screening Referrals Made N/A    Medical Referrals Made N/A    Medical Appointment Made N/A    Recently w/o PCP, now 1st time PCP visit completed due to CNs referral or appointment made N/A    Food Have Food Insecurities    Transportation N/A    Housing/Utilities No permanent housing    Interpersonal Safety N/A    Interventions Advocate/Support    Abnormal to Normal Screening Since Last CN Visit N/A    Screenings CN Performed Blood Pressure;Pulse Ox    Sent Client to Lab for: N/A    Did client attend any of the following based off CNs referral or appointments made? N/A    ED Visit Averted N/A    Life-Saving Intervention Made N/A      Questionnaire   Do you give verbal consent to treat you today? Yes    Location Patient Served  [name]    Visit Setting [facility] or Organization    Patient Status Homeless    Insurance [facility]    Insurance Referral N/A    Medication N/A    Screening Referrals N/A    Intervention Support;Blood pressure
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
## 2022-08-21 NOTE — Congregational Nurse Program (Signed)
  Dept: [phone]   Congregational Nurse Program Note  Date of Encounter: 08/21/2022 Client to [facility] for vital sign check and support. He was able sleep inside last evening at a friend's house. Has a warm coat and shoes. Plans to pan handle at his usual spot today. Was able to get some breakfast and snacks here at the center. BP 132/78 (BP Location: Left Arm, Patient Position: Sitting, Cuff Size: Normal)   Pulse (!) 103   SpO2 99% . No new concerns at this time. Support given.  Past Medical History: Past Medical History:  Diagnosis Date   Hep C w/o coma, chronic (HCC)    Hypertension    Polysubstance abuse (HCC)     Encounter Details:  CNP Questionnaire - 08/21/22 1000       Questionnaire   Ask client: Do you give verbal consent for me to treat you today? Yes    Student Assistance N/A    Location Patient Served  [facility]    Visit Setting with Client Organization    Patient Status Unhoused    Insurance Medicaid    Insurance/Financial Assistance Referral N/A    Medication N/A    Medical Provider Yes    Screening Referrals Made N/A    Medical Referrals Made N/A    Medical Appointment Made N/A    Recently w/o PCP, now 1st time PCP visit completed due to CNs referral or appointment made N/A    Food Have Food Insecurities   cleint has been getting lunch at [employer] Rn advised about [employer] meals on Saturday   Transportation N/A    Housing/Utilities No permanent housing    Interpersonal Safety N/A    Interventions Advocate/Support    Abnormal to Normal Screening Since Last CN Visit N/A    Screenings CN Performed Blood Pressure;Pulse Ox    Sent Client to Lab for: N/A    Did client attend any of the following based off CNs referral or appointments made? N/A    ED Visit Averted N/A    Life-Saving Intervention Made N/A      Questionnaire   Do you give verbal consent to treat you today? Yes     Location Patient Served  [name]    Visit Setting [facility] or Organization    Patient Status Homeless    Insurance [facility]    Insurance Referral N/A    Medication N/A    Screening Referrals N/A    Intervention Support;Blood pressure
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
## 2022-08-23 NOTE — Congregational Nurse Program (Signed)
  Dept: [phone]   Congregational Nurse Program Note  Date of Encounter: 08/23/2022 Client to [facility] day center for support. He slept outside last evening at the loading dock near by. He is familiar with a church he can stay at, but chose not to. He is hoping to stay at his brother's over the weekend. Encouraged again to obtain his ID. Support given, no new needs at this time. Past Medical History: Past Medical History:  Diagnosis Date   Hep C w/o coma, chronic (HCC)    Hypertension    Polysubstance abuse (HCC)     Encounter Details:  CNP Questionnaire - 08/23/22 1030       Questionnaire   Ask client: Do you give verbal consent for me to treat you today? Yes    Student Assistance N/A    Location Patient Served  [facility]    Visit Setting with Client Organization    Patient Status Unhoused    Insurance Medicaid    Insurance/Financial Assistance Referral N/A    Medication N/A    Medical Provider Yes    Screening Referrals Made N/A    Medical Referrals Made N/A    Medical Appointment Made N/A    Recently w/o PCP, now 1st time PCP visit completed due to CNs referral or appointment made N/A    Food Have Food Insecurities   cleint has been getting lunch at [employer] Rn advised about [employer] [facility] meals on Saturday   Transportation N/A    Housing/Utilities No permanent housing    Interpersonal Safety N/A    Interventions Advocate/Support    Abnormal to Normal Screening Since Last CN Visit N/A    Screenings CN Performed N/A    Sent Client to Lab for: N/A    Did client attend any of the following based off CNs referral or appointments made? N/A   client has been getting lunch at a small church on [address] he is aware of the food at the [employer] [facility] [employer] shelter   ED Visit Averted N/A    Life-Saving Intervention Made N/A      Questionnaire   Do you give verbal consent to treat you today? Yes     Location Patient Served  [name]    Visit Setting [facility] or Organization    Patient Status Homeless    Insurance [facility]    Insurance Referral N/A    Medication N/A    Screening Referrals N/A    Intervention Support;Blood pressure

## 2022-08-26 IMAGING — CR DG CHEST 2V
1 series · 3 of 3 positions shown · non-contrast
Comparison: 08/16/2021 multiple healed left

CLINICAL DATA: Chest pain

EXAM:
CHEST - 2 VIEW

[Series 1: dg chest 2 view · 0.14mm/px · 3 of 3 slices shown]
[im 1/3]
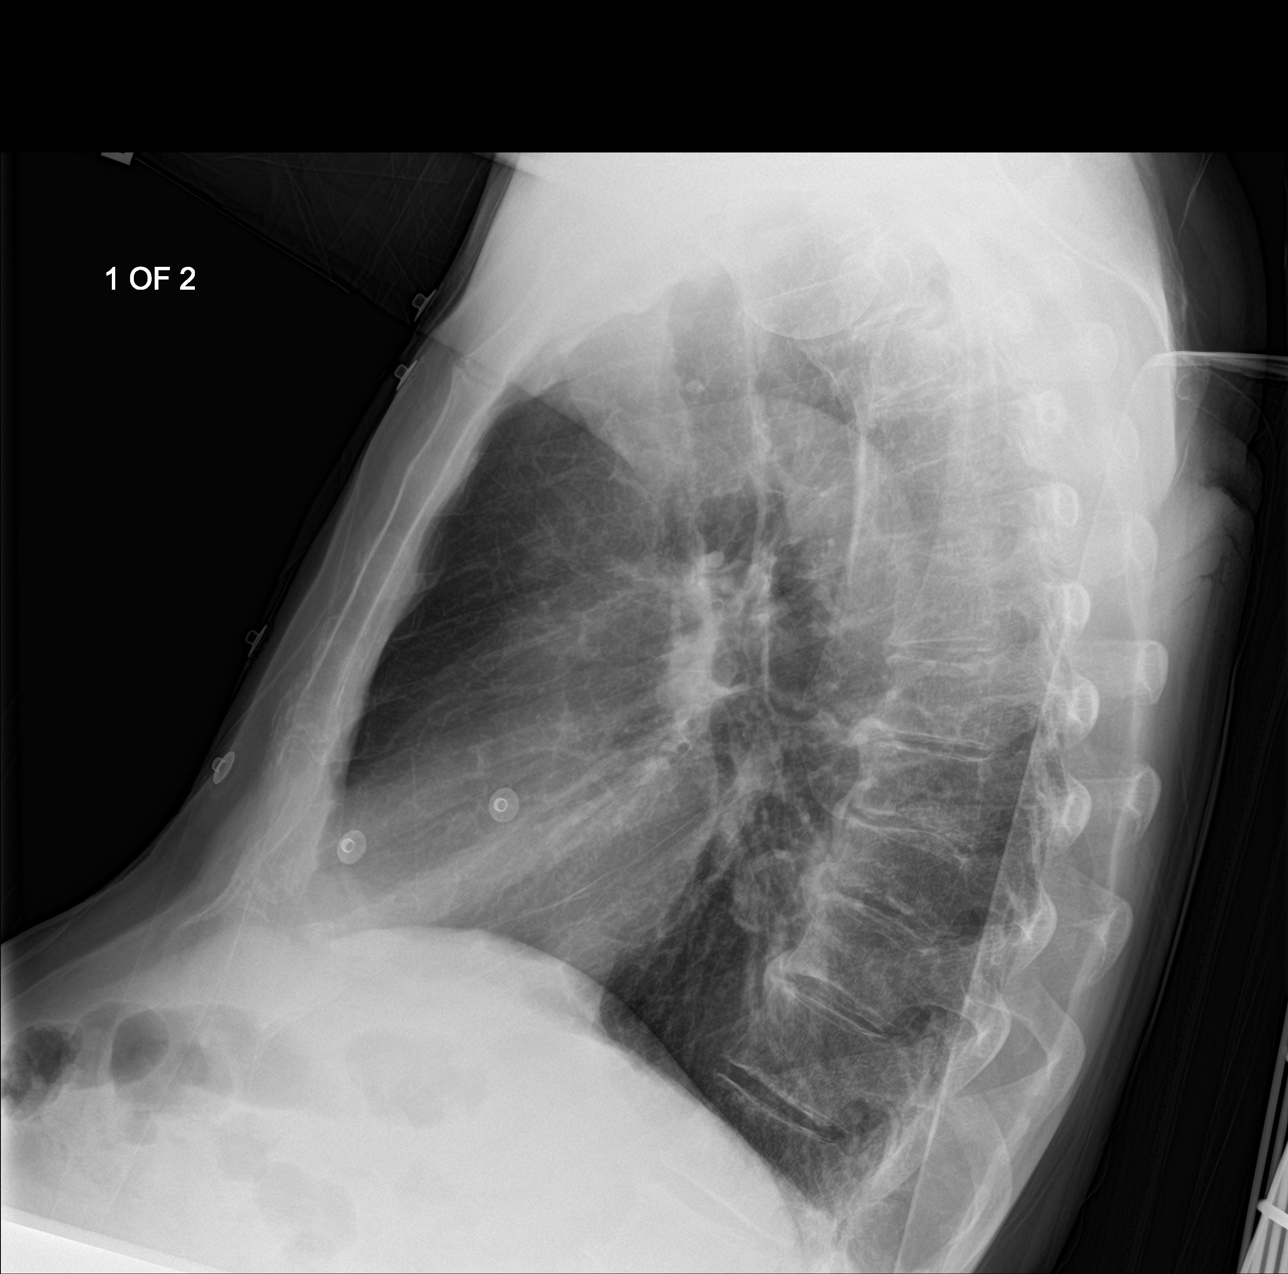
[im 2/3]
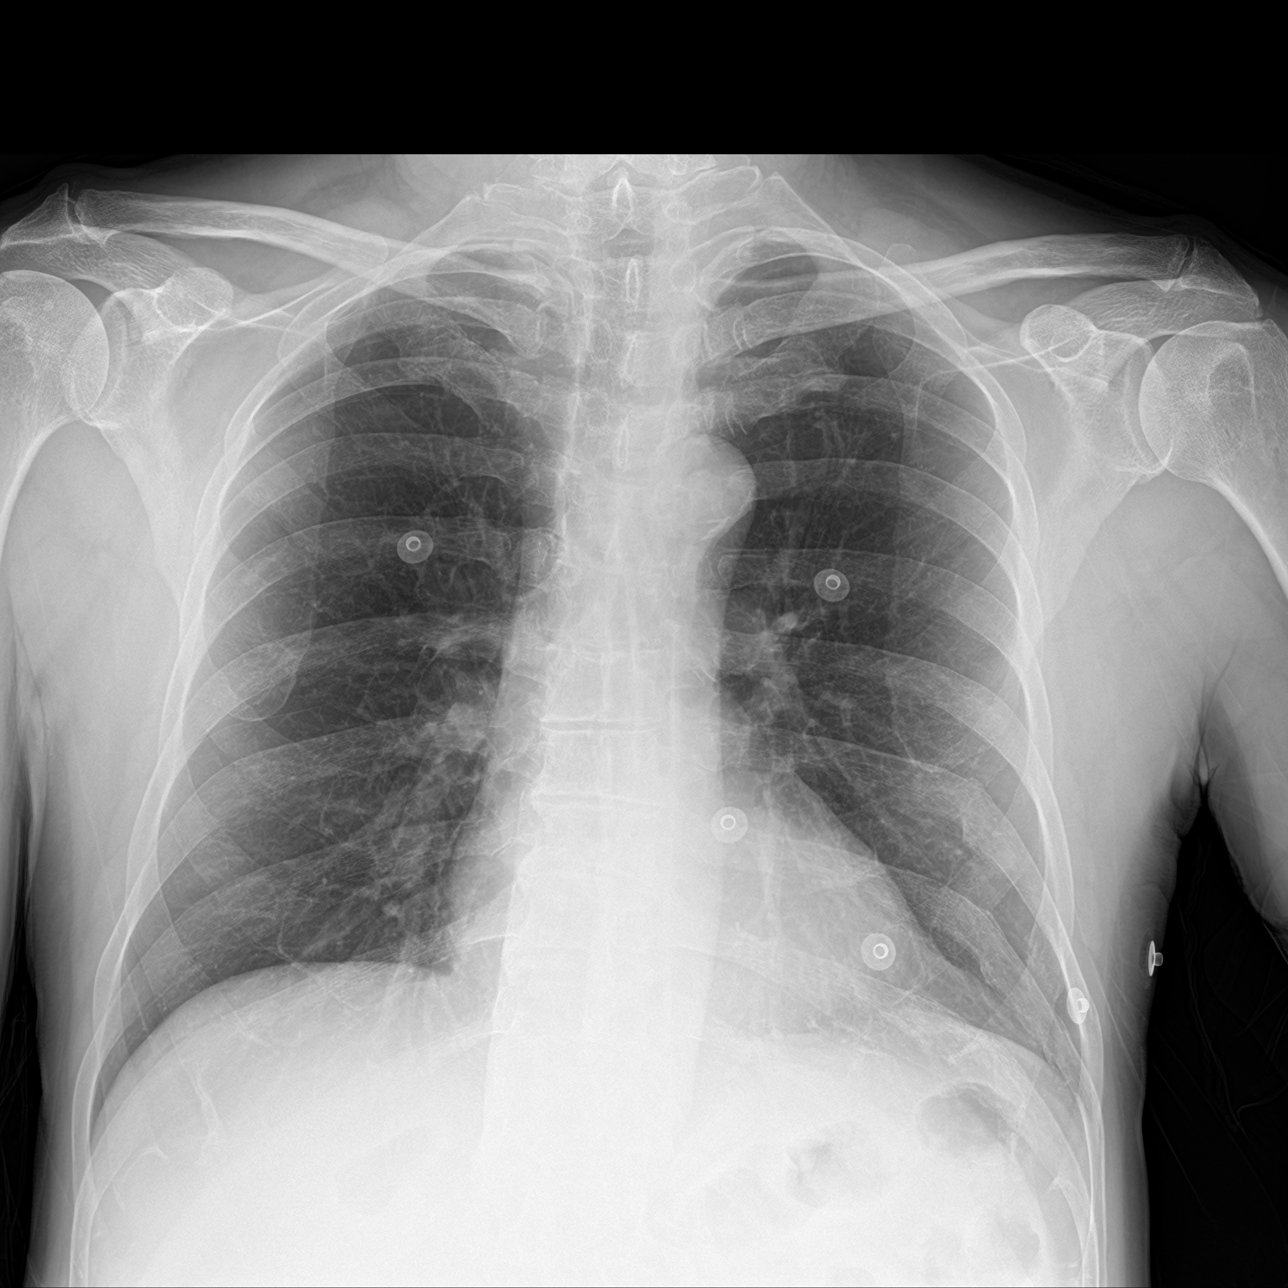
[im 3/3]
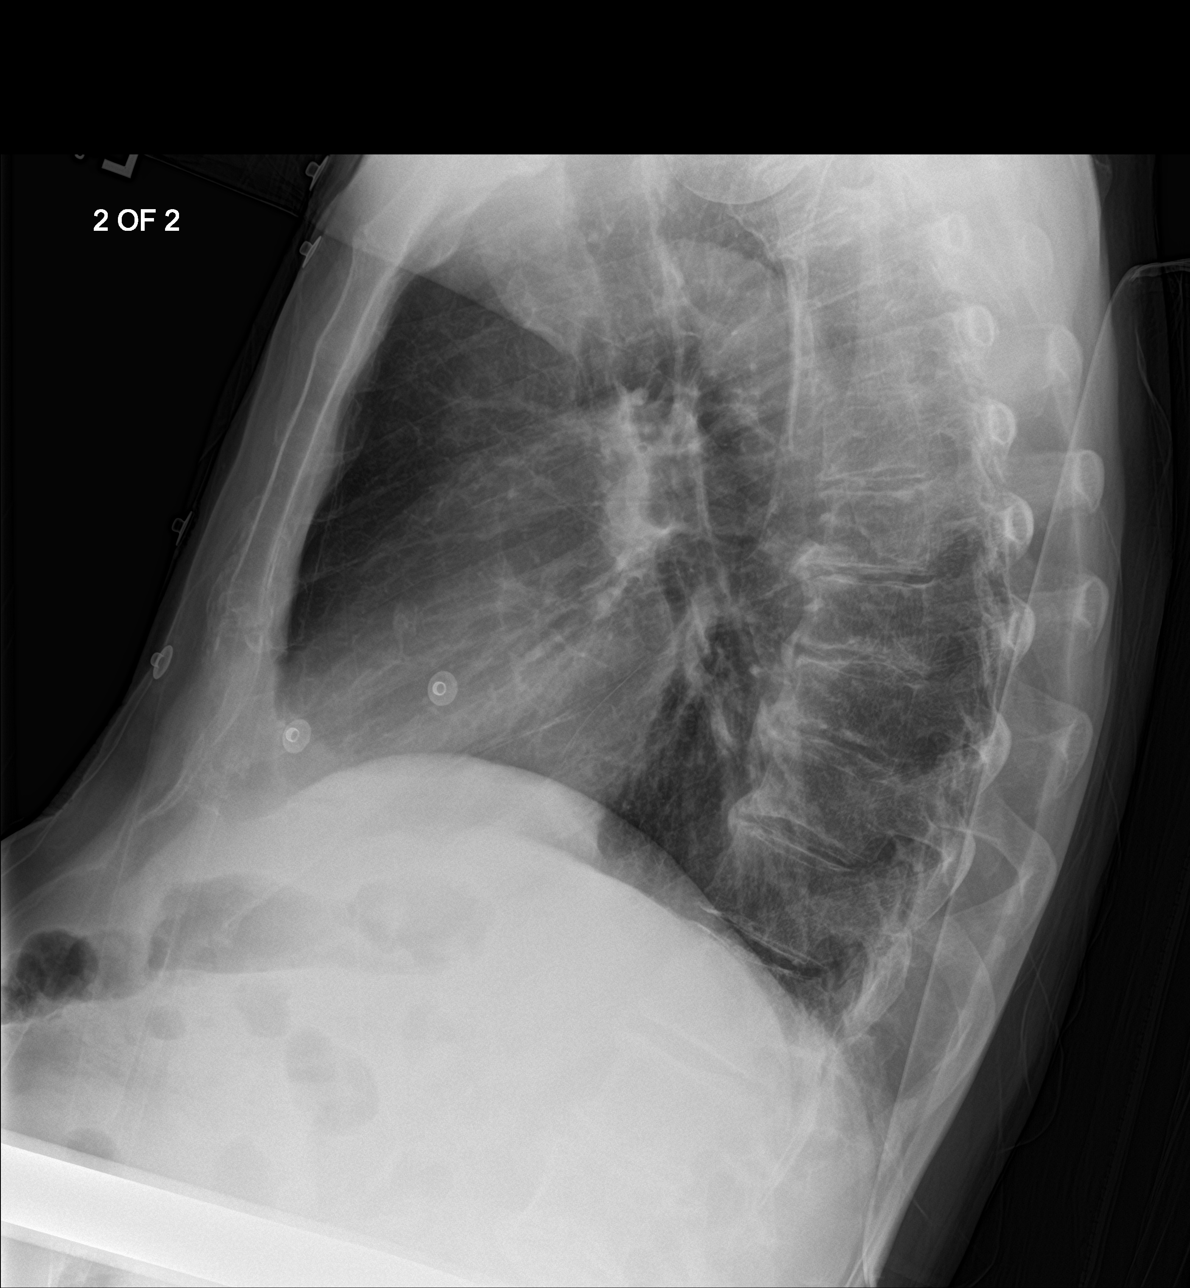

[3 of 3 positions shown; findings below may reference images not displayed]

FINDINGS: Lungs are well expanded, symmetric, and clear. No pneumothorax or
pleural effusion. Cardiac size within normal limits. Pulmonary
vascularity is normal. Osseous structures are age-appropriate. Rib
fractures are again noted. No acute bone abnormality.
IMPRESSION: No active cardiopulmonary disease.
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
## 2022-09-05 IMAGING — CR DG CHEST 2V
1 series · 2 of 2 positions shown · non-contrast
Comparison: PA Lat 09/09/2021

CLINICAL DATA: Chest pain.

EXAM:
CHEST - 2 VIEW

[Series 1: dg chest 2 view · 0.14mm/px · 2 of 2 slices shown]
[im 1/2]
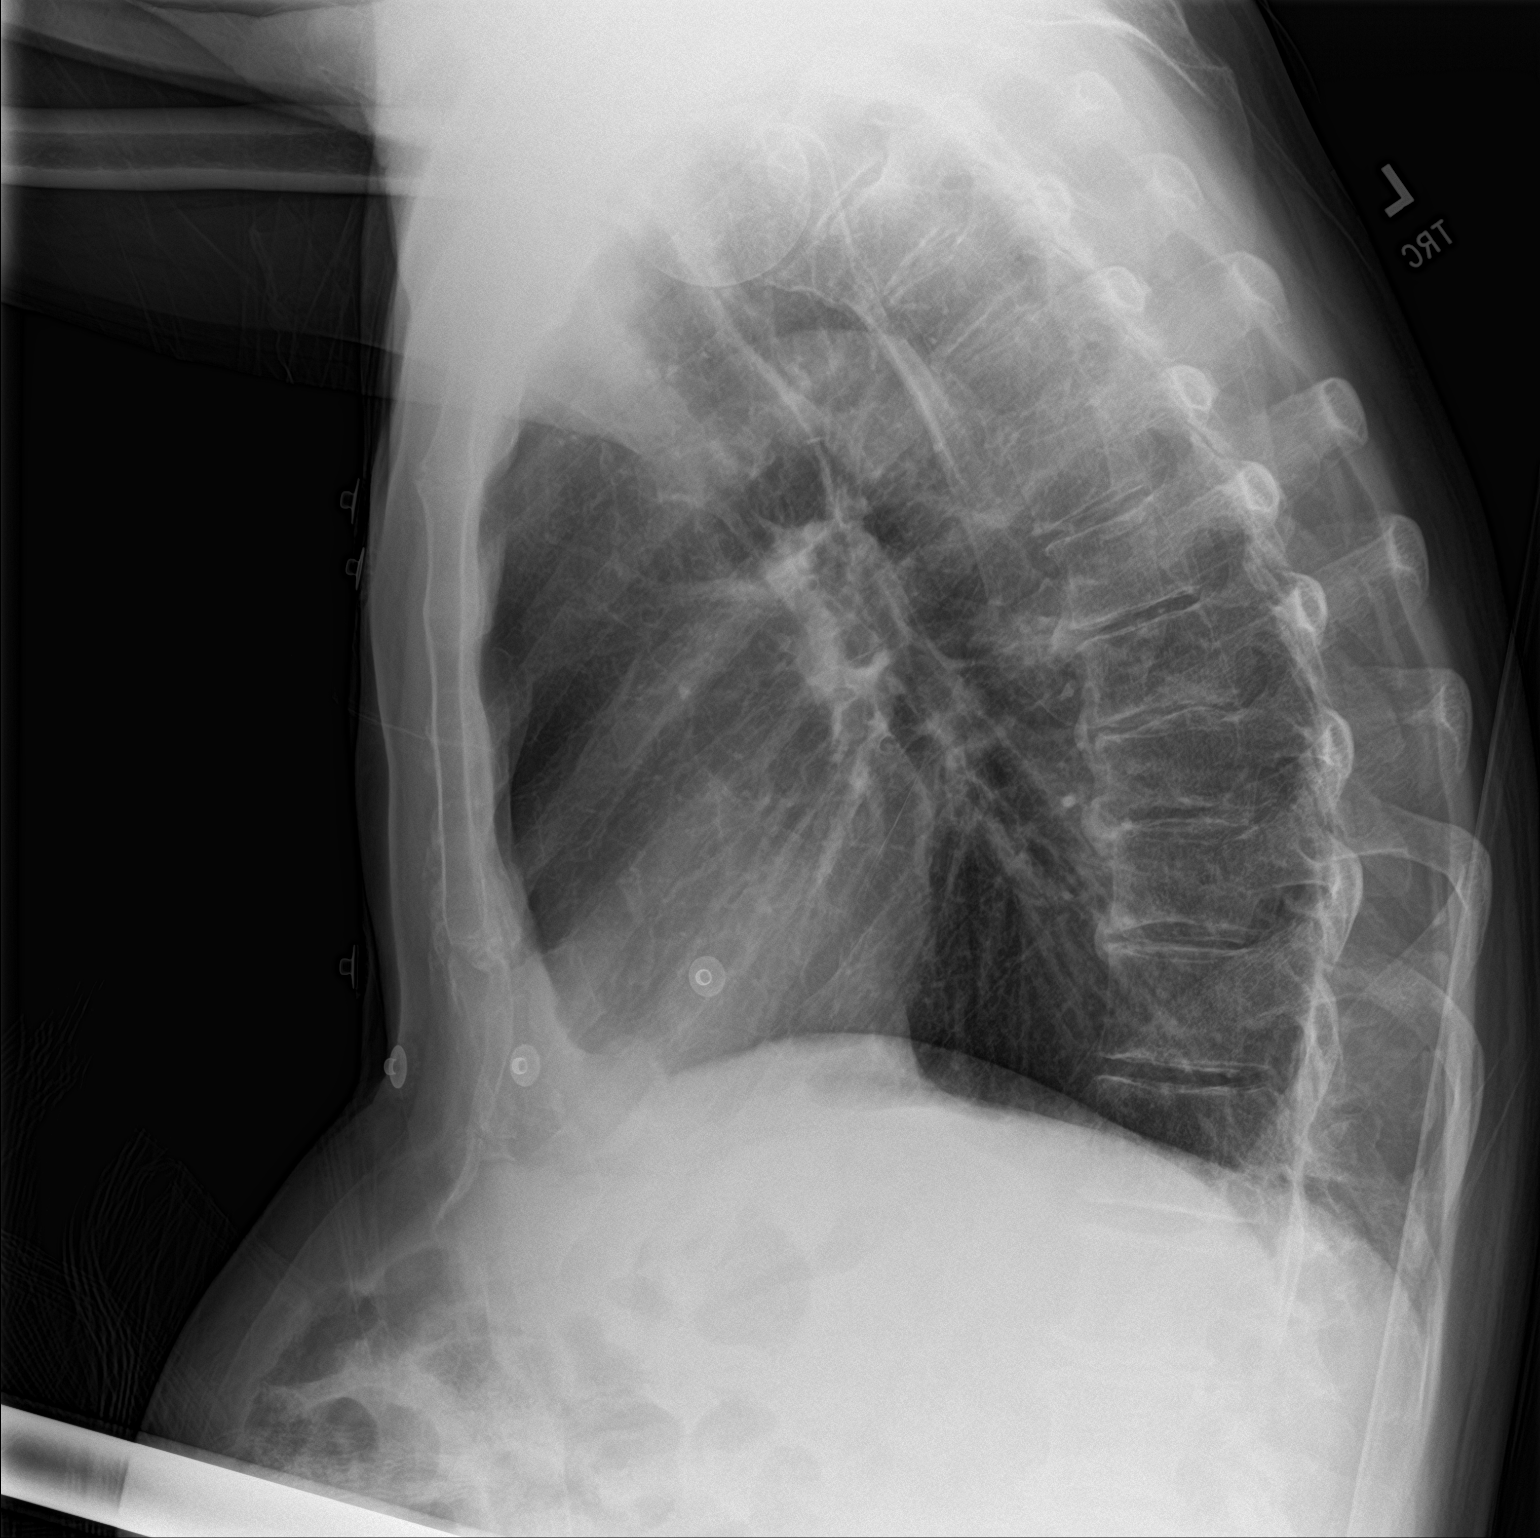
[im 2/2]
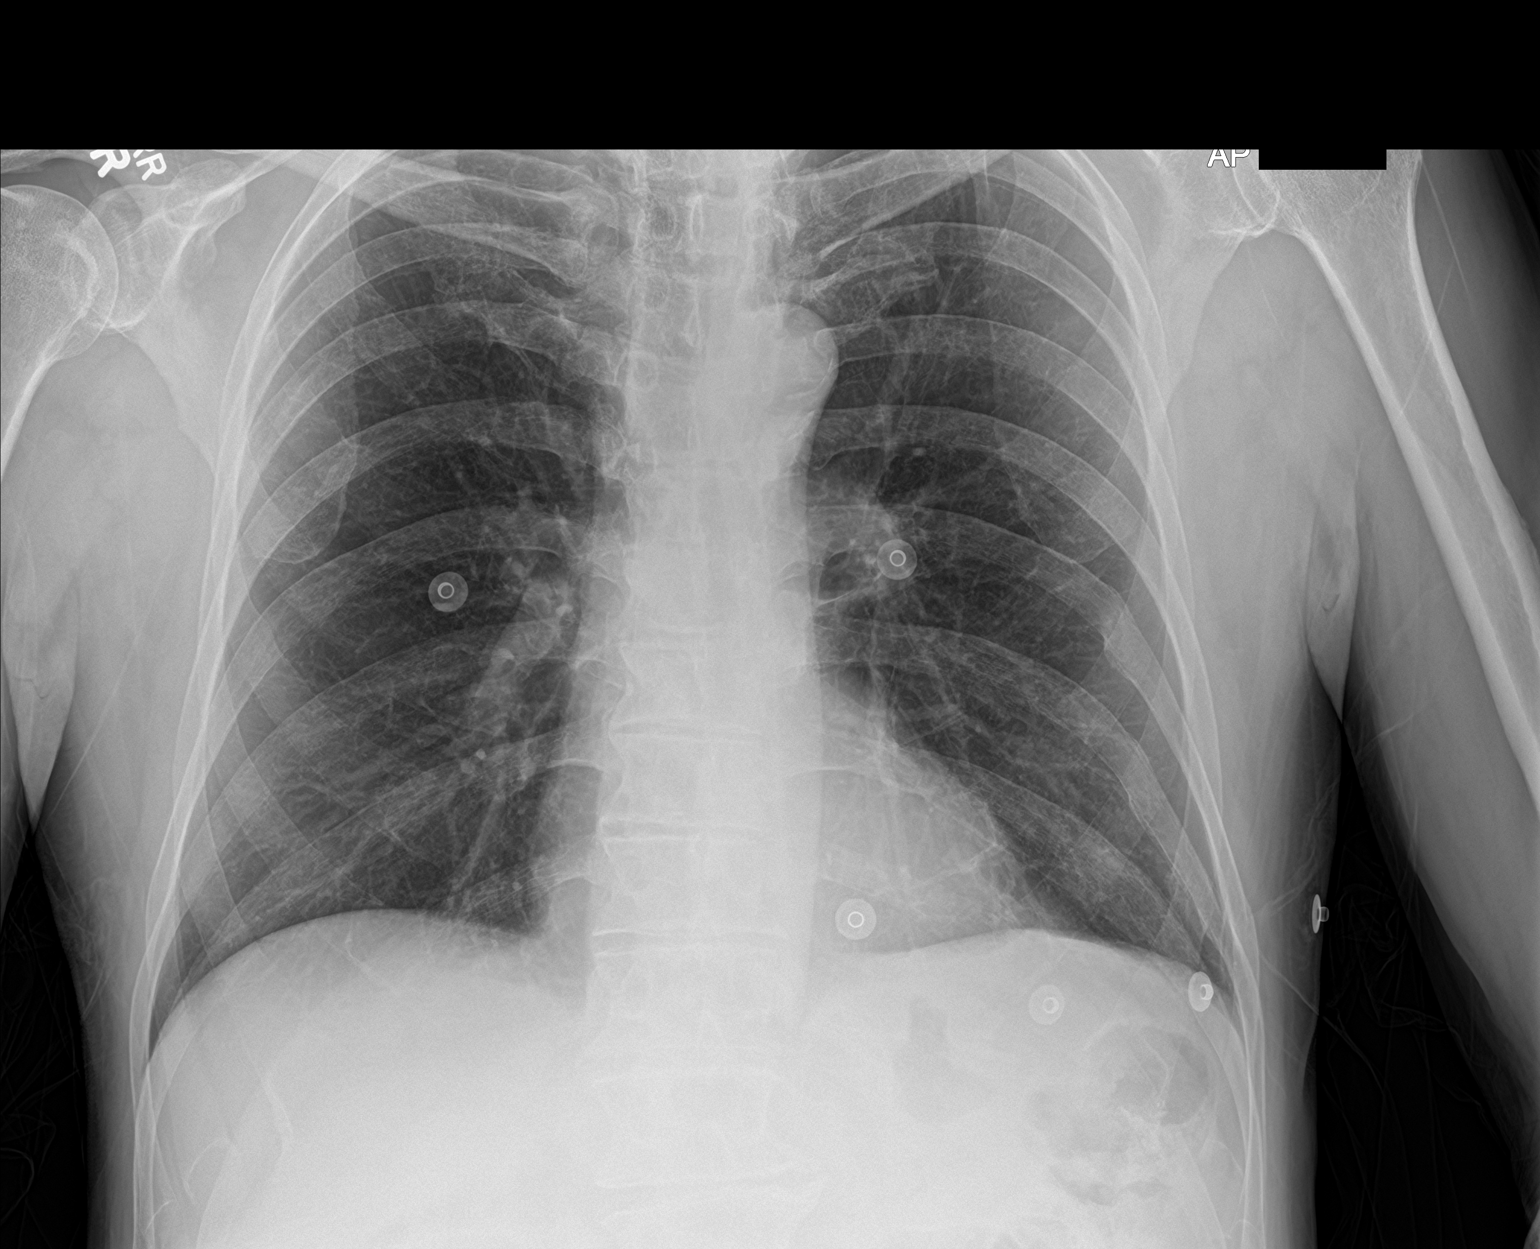

[2 of 2 positions shown; findings below may reference images not displayed]

FINDINGS: Heart size and vasculature are normal. There is a slightly tortuous
aorta with patchy calcification in the arch. The lungs are slightly
emphysematous but clear. The sulci are sharp. There is thoracic
spondylosis and a few lower thoracic levels with anterior bridging
enthesopathy. There is mild osteopenia, and chronic healed fractures
of the posterolateral left ribs.
IMPRESSION: No active cardiopulmonary disease.  Stable COPD chest.

## 2022-09-11 IMAGING — MR MR LUMBAR SPINE W/O CM
4 of 5 series · 30 of 48 positions shown · non-contrast
Comparison: Lumbar spine radiographs 05/14/2021

CLINICAL DATA: Low back pain, left rib pain and left leg pain for
3-4 weeks.

EXAM:
MRI LUMBAR SPINE WITHOUT CONTRAST
TECHNIQUE: Multiplanar, multisequence MR imaging of the lumbar spine was
performed. No intravenous contrast was administered.

[Series 5: T2 · sagittal · 4.0mm · 0.81mm/px · 6 of 18 slices shown (1 of 2)]
[im 1/18]
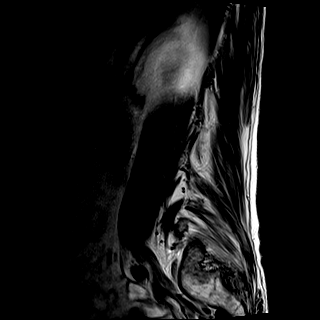
[im 4/18]
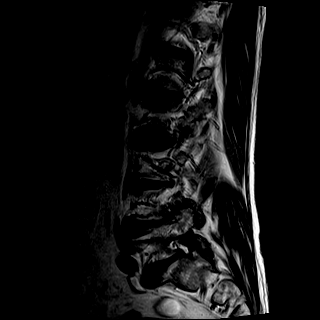
[im 7/18]
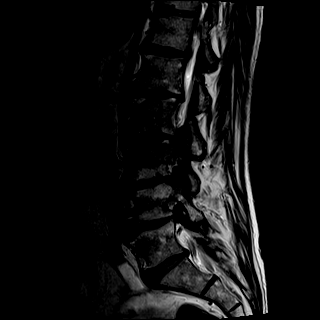
[im 11/18]
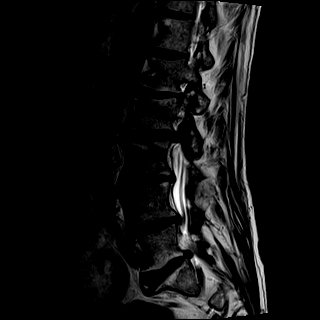
[im 14/18]
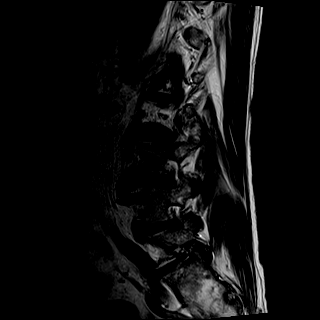
[im 18/18]
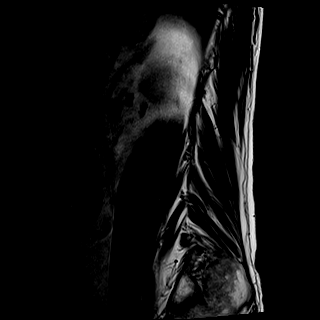

[Series 6: T1 · sagittal · 4.0mm · 0.81mm/px · 6 of 18 slices shown (1 of 2)]
[im 1/18]
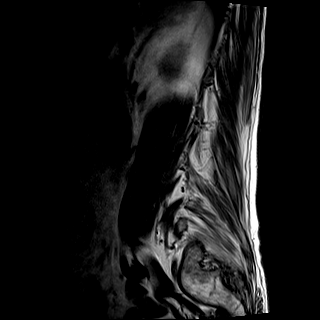
[im 4/18]
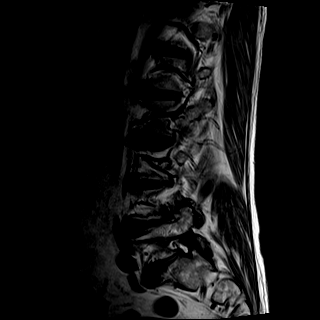
[im 7/18]
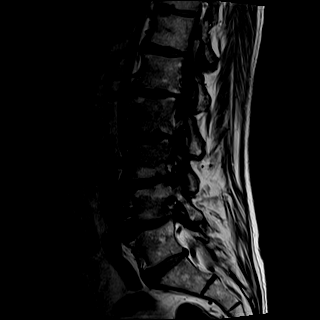
[im 11/18]
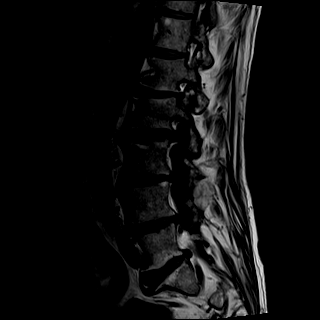
[im 14/18]
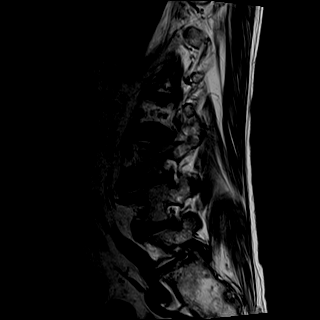
[im 18/18]
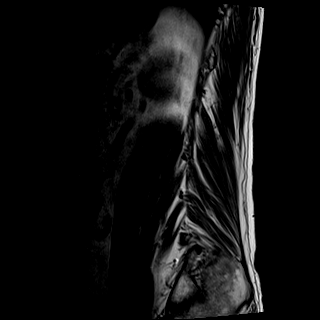

[Series 10: T2 · axial · 4.0mm · 0.78mm/px · z∈[-85,+156]mm · 9 of 42 slices shown (2 of 2)]
[im 1/42]
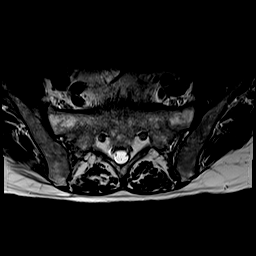
[im 6/42]
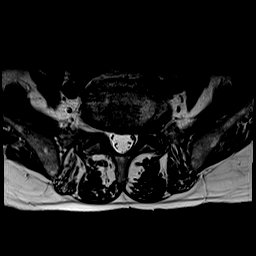
[im 12/42]
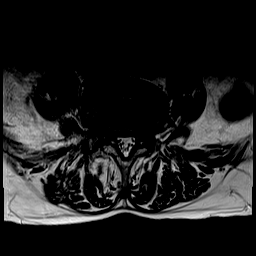
[im 18/42]
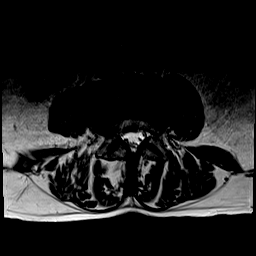
[im 21/42]
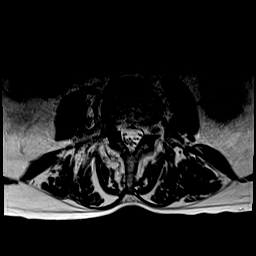
[im 24/42]
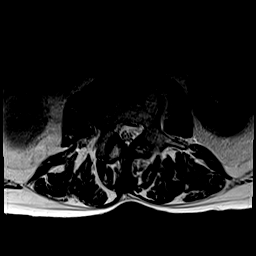
[im 30/42]
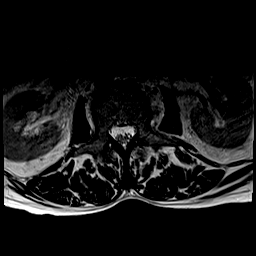
[im 36/42]
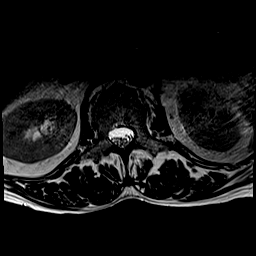
[im 42/42]
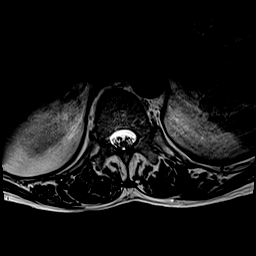

[Series 13: T1 · axial · 4.0mm · 0.39mm/px · z∈[-85,+156]mm · 9 of 42 slices shown (2 of 2)]
[im 1/42]
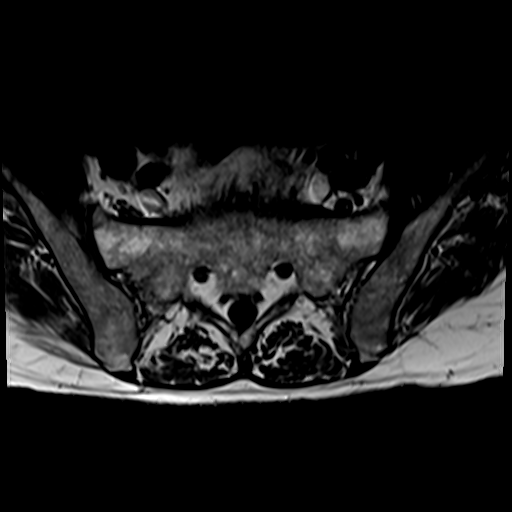
[im 6/42]
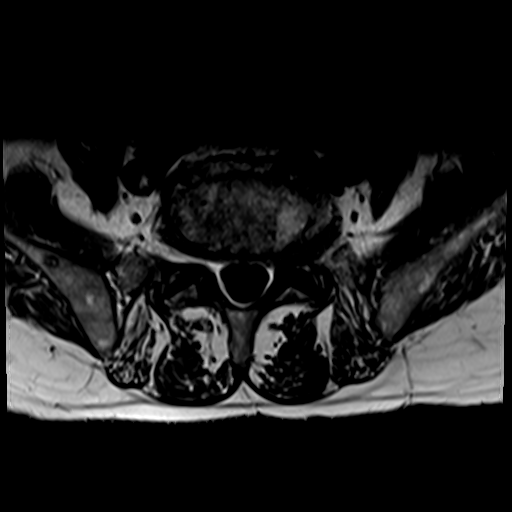
[im 12/42]
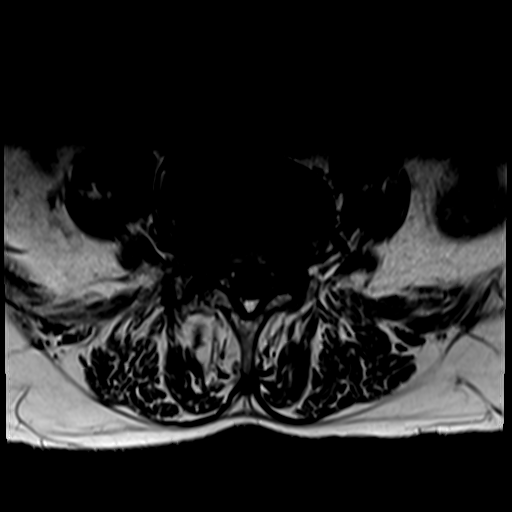
[im 18/42]
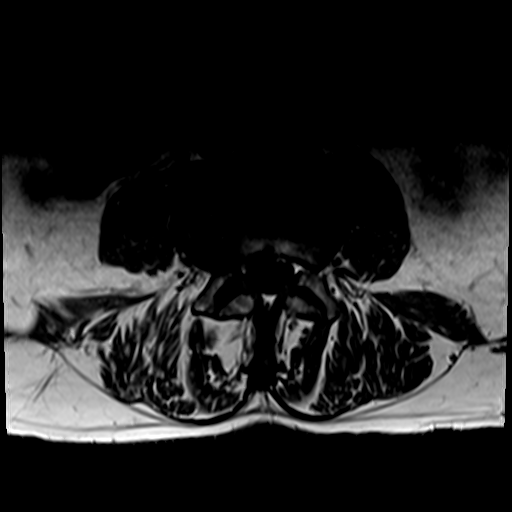
[im 21/42]
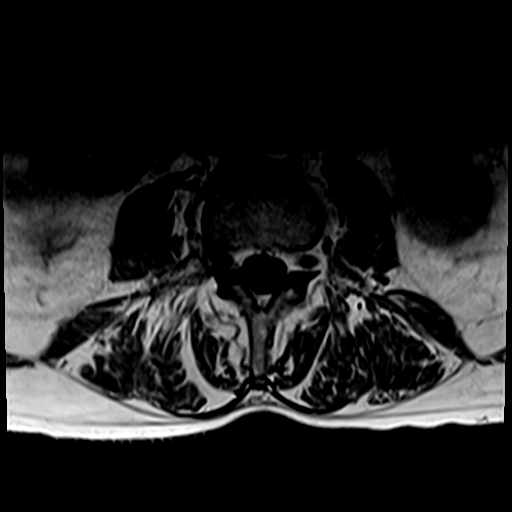
[im 24/42]
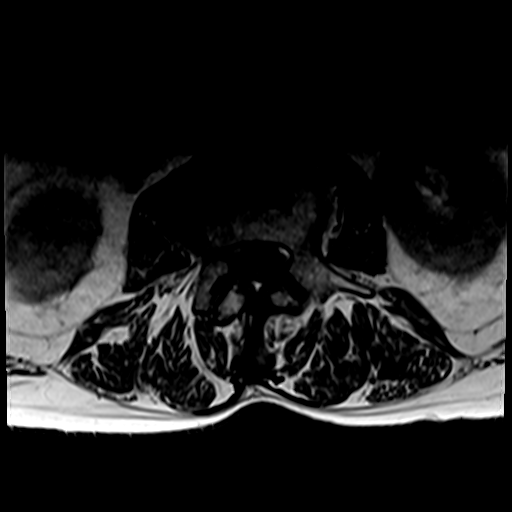
[im 30/42]
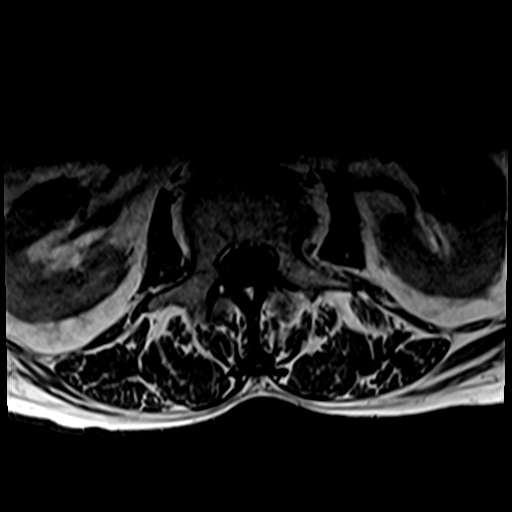
[im 36/42]
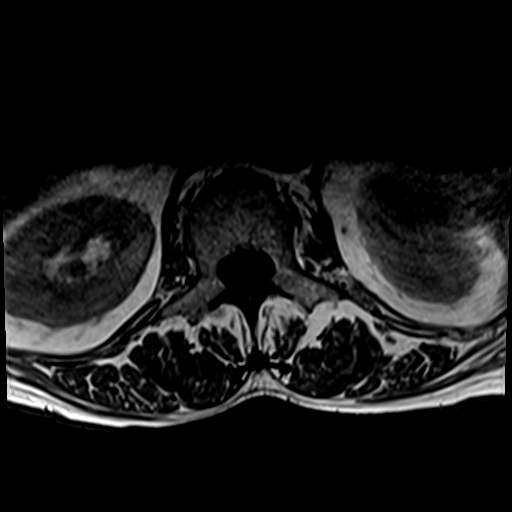
[im 42/42]
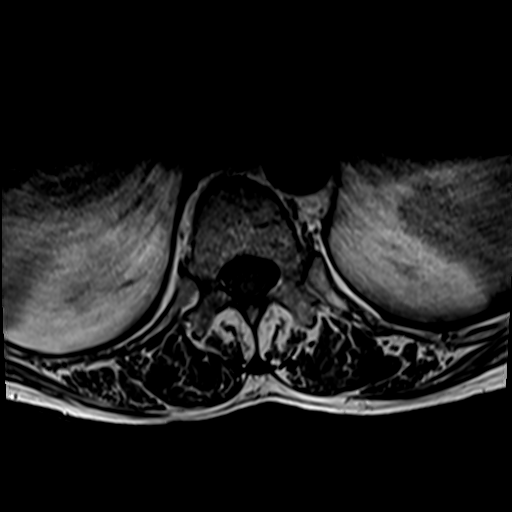

[30 of 48 positions shown; findings below may reference images not displayed]

FINDINGS: Segmentation: There are five lumbar type vertebral bodies. The last
full intervertebral disc space is labeled L5-S1. This correlates
with the lumbar radiographs.

Alignment: Mild scoliosis but normal overall alignment in the
sagittal plane.

Vertebrae: Age advance degenerative lumbar spondylosis with
multilevel disc disease and facet disease and associated endplate
reactive changes but no fracture or bone lesion. The facets are
normally aligned. No pars defects.

Conus medullaris and cauda equina: Conus extends to the bottom of
T12 level. Conus and cauda equina appear normal.

Paraspinal and other soft tissues: No significant paraspinal or
retroperitoneal findings.

Disc levels:

T12-L1: Mild annular bulge with mild bilateral lateral recess
encroachment. No spinal or foraminal stenosis.

L1-2: Degenerative disc disease and facet disease with a diffuse
bulging annulus, osteophytic ridging and a small focal left
paracentral disc protrusion. These findings contribute to mild
spinal and mild bilateral lateral recess stenosis and possible
irritation of the left L2 nerve root. No significant foraminal
stenosis.

L2-3: Advanced degenerative disc disease and facet disease.
Broad-based bulging annulus and osteophytic ridging along with
paracentral disc protrusions most significant on the left side.
There is moderate to moderately severe spinal and bilateral lateral
recess stenosis and right greater than left foraminal stenosis.

L3-4: Bulging annulus, osteophytic ridging and facet disease with
mild flattening of the ventral thecal sac and mild bilateral lateral
recess stenosis. No significant spinal stenosis or foraminal
stenosis. There is and extraforaminal and far lateral disc
osteophyte complex on the right potentially irritating the
extraforaminal right L3 nerve root.

L4-5: Bulging degenerated annulus, small central disc protrusion,
osteophytic ridging and severe right-sided facet disease. There is
mass effect on the right side of the thecal sac and moderate right
lateral recess stenosis. There is also significant right foraminal
stenosis likely involving the right L4 nerve root. No significant
spinal stenosis.

L5-S1: Bulging annulus and moderate facet disease but no focal disc
protrusions, significant spinal or foraminal stenosis.
IMPRESSION: 1. Scoliosis and degenerative lumbar spondylosis with multilevel
disc disease and facet disease.
2. Multilevel multifactorial spinal, lateral recess and foraminal
stenosis as discussed above at the individual levels. The most
significant level is L2-3 where there is moderate to moderately
severe spinal and bilateral lateral recess stenosis and right
greater than left foraminal stenosis.
3. Extraforaminal and far lateral disc osteophyte complex on the
right at L3-4 potentially irritating the extraforaminal right L3
nerve root.
4. Moderate right lateral recess stenosis and significant right
foraminal stenosis at L4-5.

## 2022-09-16 IMAGING — CT CT HEAD W/O CM
4 series · 16 of 47 positions shown, 18 images · non-contrast
Comparison: Head CT 05/14/2021

CLINICAL DATA: Head trauma, abnormal mental status (Age 18-64y)

EXAM:
CT HEAD WITHOUT CONTRAST
TECHNIQUE: Contiguous axial images were obtained from the base of the skull
through the vertex without intravenous contrast.

[Series 2: head wo · axial · 0.42mm/px · z∈[-182,-57]mm · 7 of 35 slices shown, 9 images]
[im 5/35  brain]
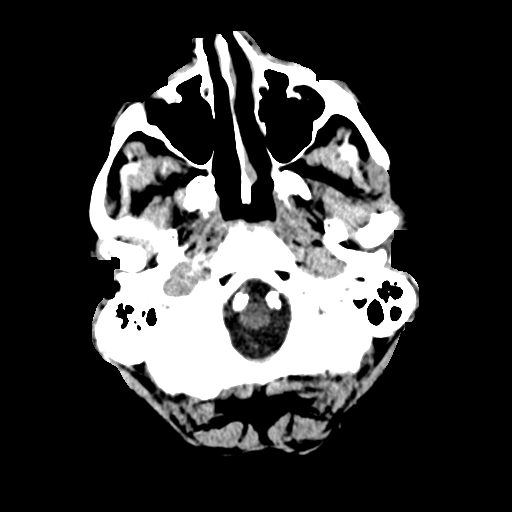
[im 5/35  bone]
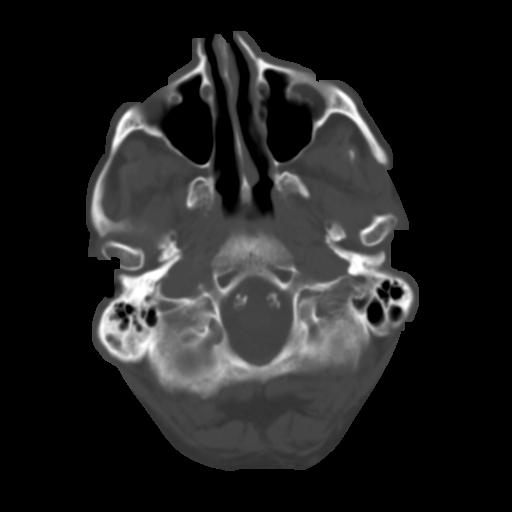
[im 9/35  brain]
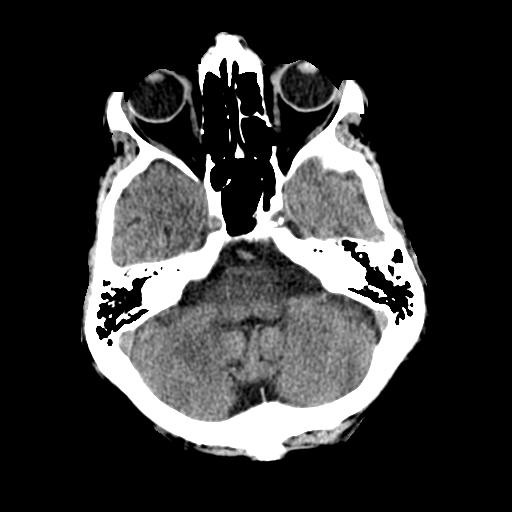
[im 13/35  brain]
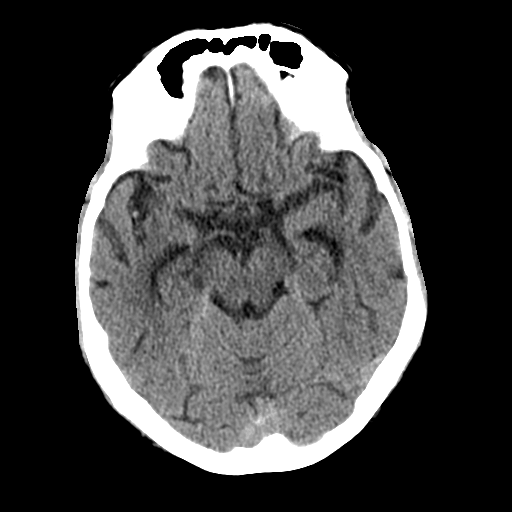
[im 18/35  brain]
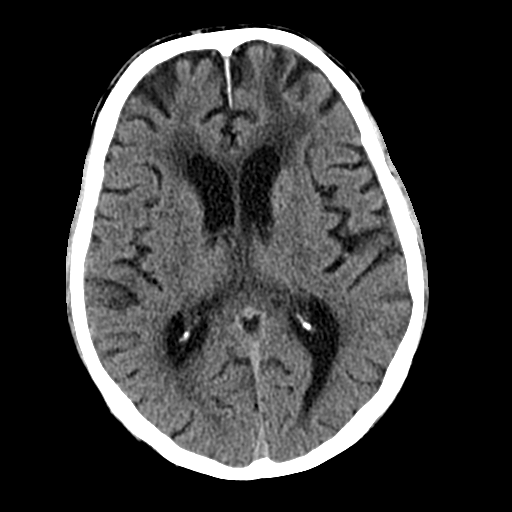
[im 22/35  brain]
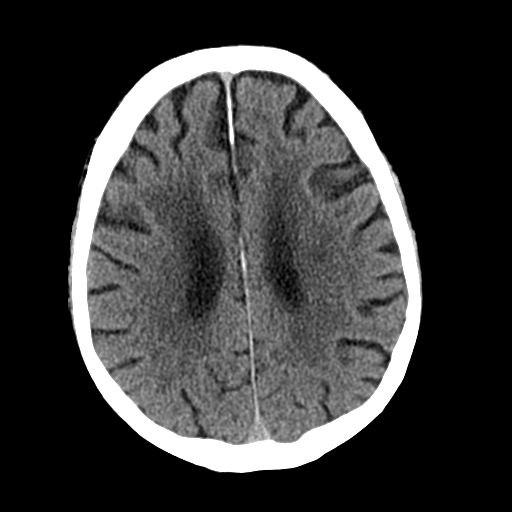
[im 22/35  bone]
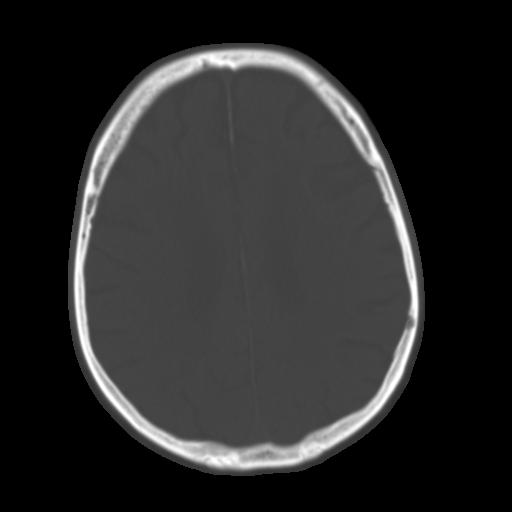
[im 26/35  brain]
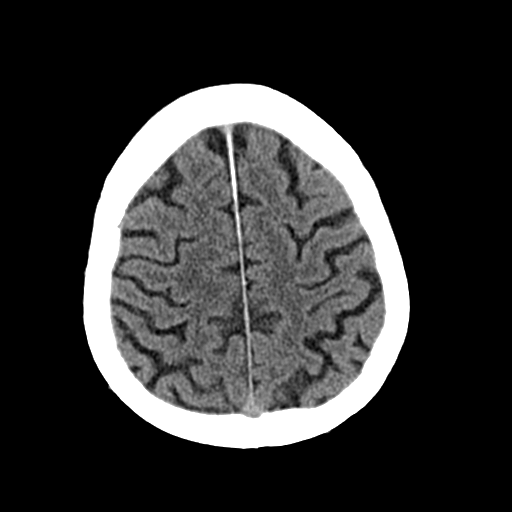
[im 30/35  brain]
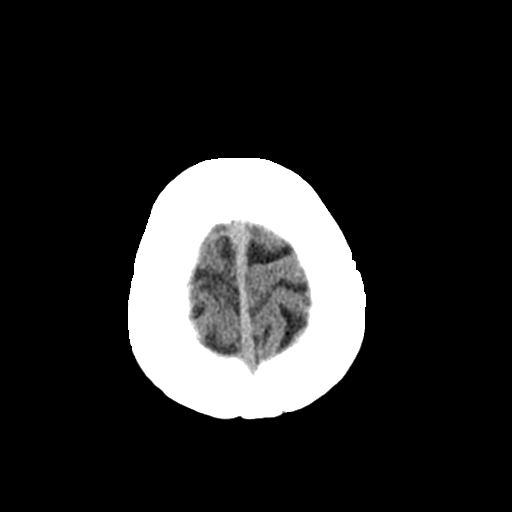

[Series 3: head bone · axial · 0.42mm/px · z∈[-186,-152]mm · 3 of 86 slices shown]
[im 9/86  bone]
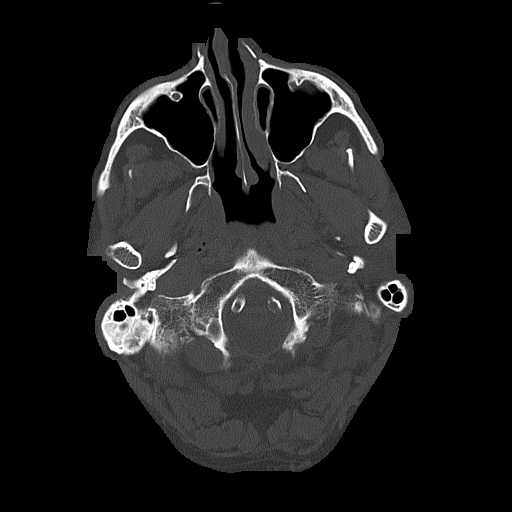
[im 18/86  bone]
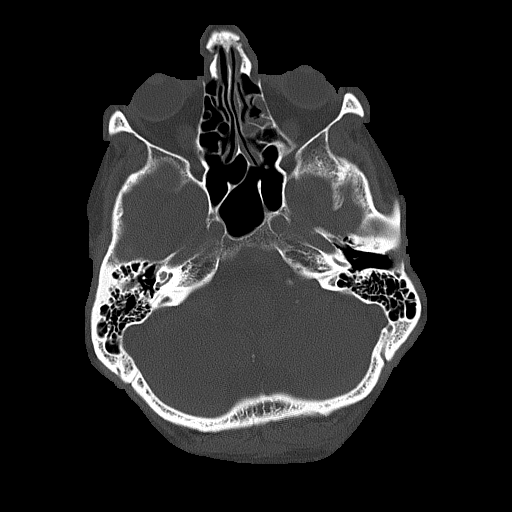
[im 26/86  bone]
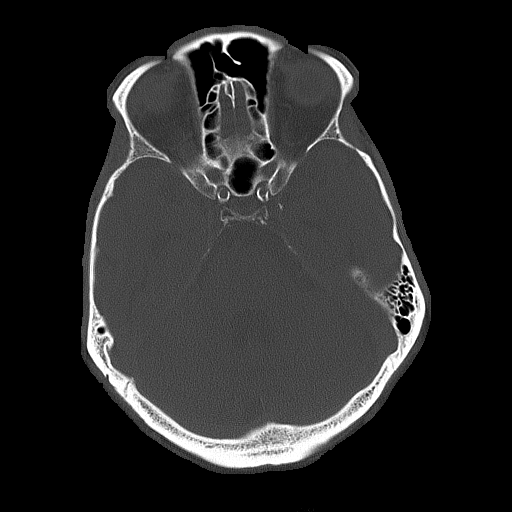

[Series 4: coronal soft tissue · coronal · 0.34mm/px · 3 of 71 slices shown]
[im 24/71  brain]
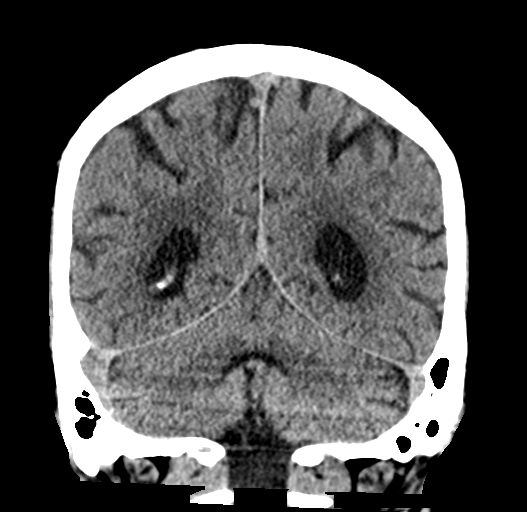
[im 32/71  brain]
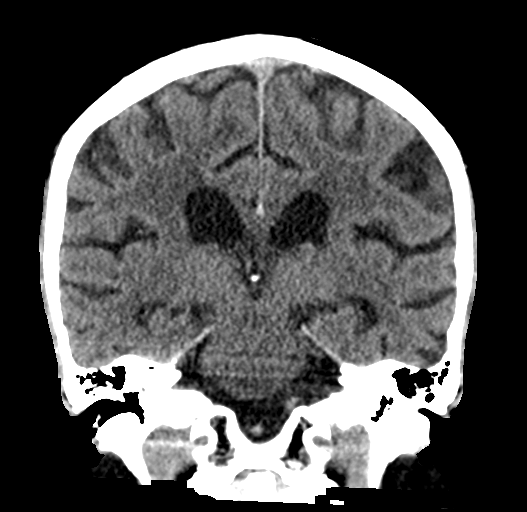
[im 39/71  brain]
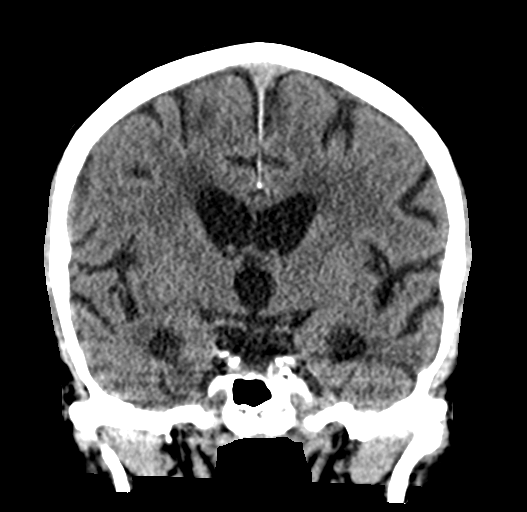

[Series 5: sagittal soft tissue · sagittal · 0.37mm/px · 3 of 61 slices shown]
[im 21/61  brain]
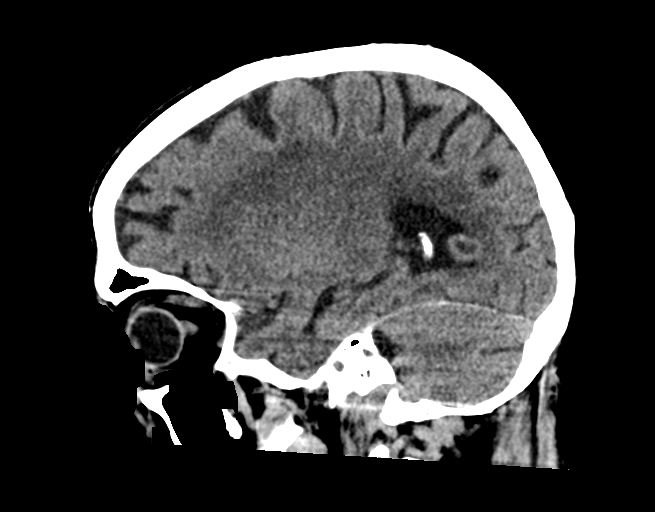
[im 31/61  brain]
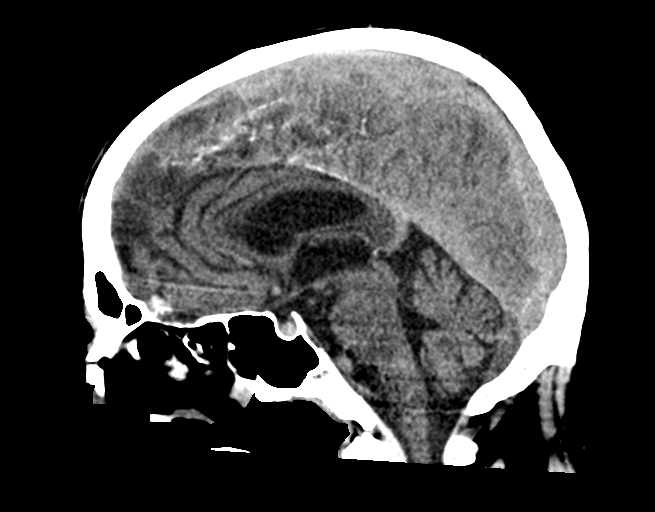
[im 41/61  brain]
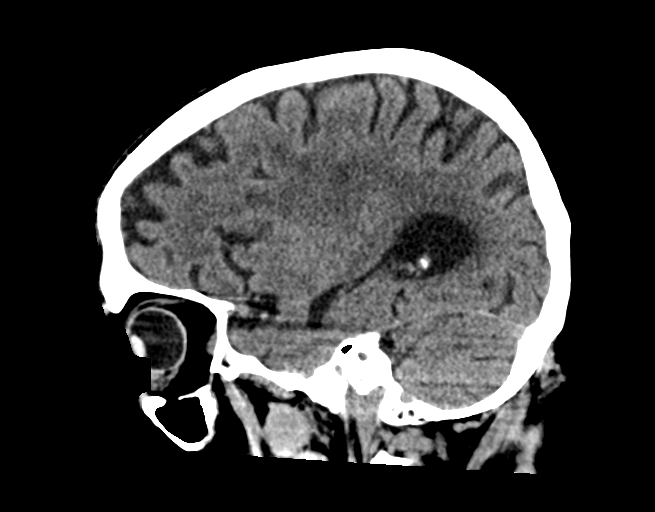

[16 of 47 positions shown; findings below may reference images not displayed]

FINDINGS: Brain: Stable but age advanced atrophy and chronic small vessel
ischemic change. No intracranial hemorrhage, mass effect, or midline
shift. No hydrocephalus. The basilar cisterns are patent. No
evidence of territorial infarct or acute ischemia. No extra-axial or
intracranial fluid collection.

Vascular: Atherosclerosis of skullbase vasculature without
hyperdense vessel or abnormal calcification.

Skull: No fracture or focal lesion.

Sinuses/Orbits: Paranasal sinuses and mastoid air cells are clear.
The visualized orbits are unremarkable. Chronic nasal bone fractures
in unchanged alignment.

Other: None.
IMPRESSION: 1. No acute intracranial abnormality. No skull fracture.
2. Stable but age advanced atrophy and chronic small vessel ischemic
change.

## 2022-09-16 IMAGING — CR DG LUMBAR SPINE COMPLETE 4+V
5 series · 5 of 5 positions shown · non-contrast
Comparison: 05/14/2021

CLINICAL DATA: Fall

EXAM:
LUMBAR SPINE - COMPLETE 4+ VIEW

[l-spine ap]
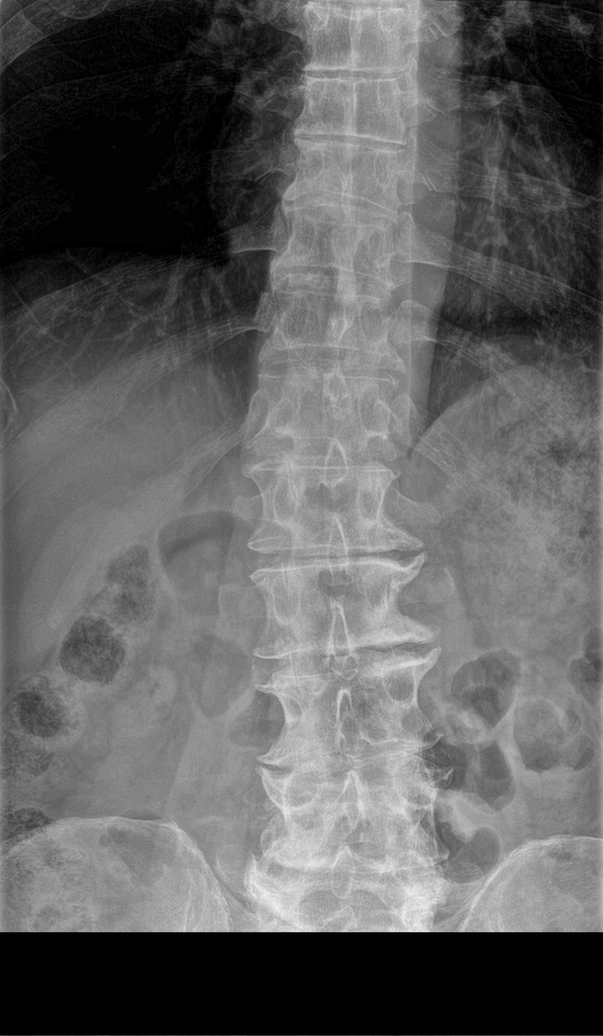

[l-spine obl (1 of 2)]
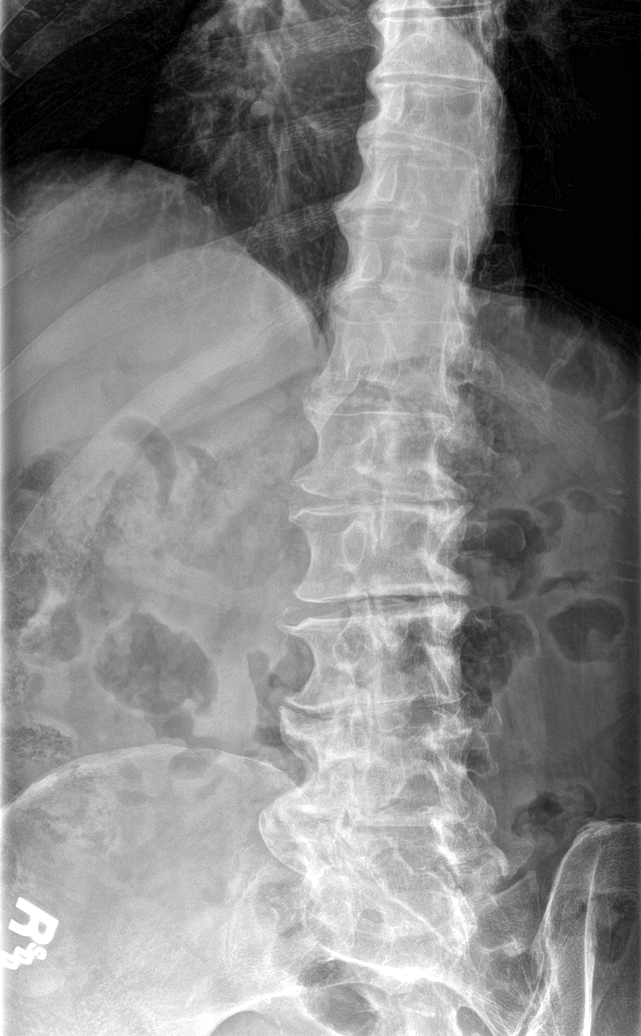

[l-spine obl (2 of 2)]
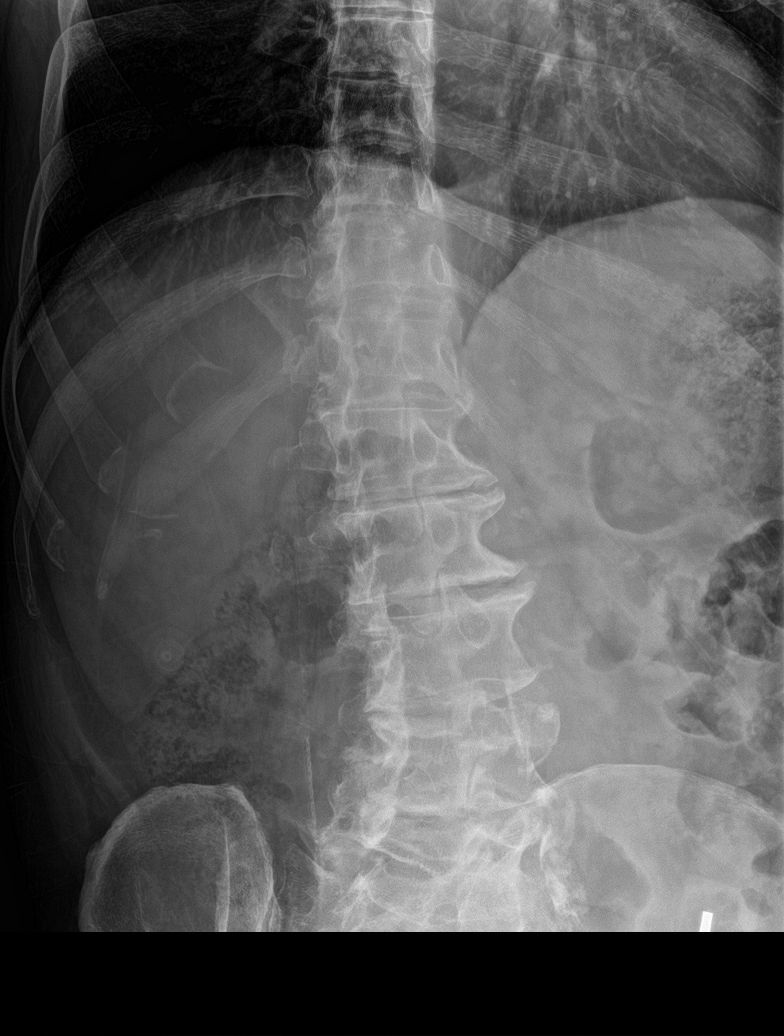

[l-spine lat]
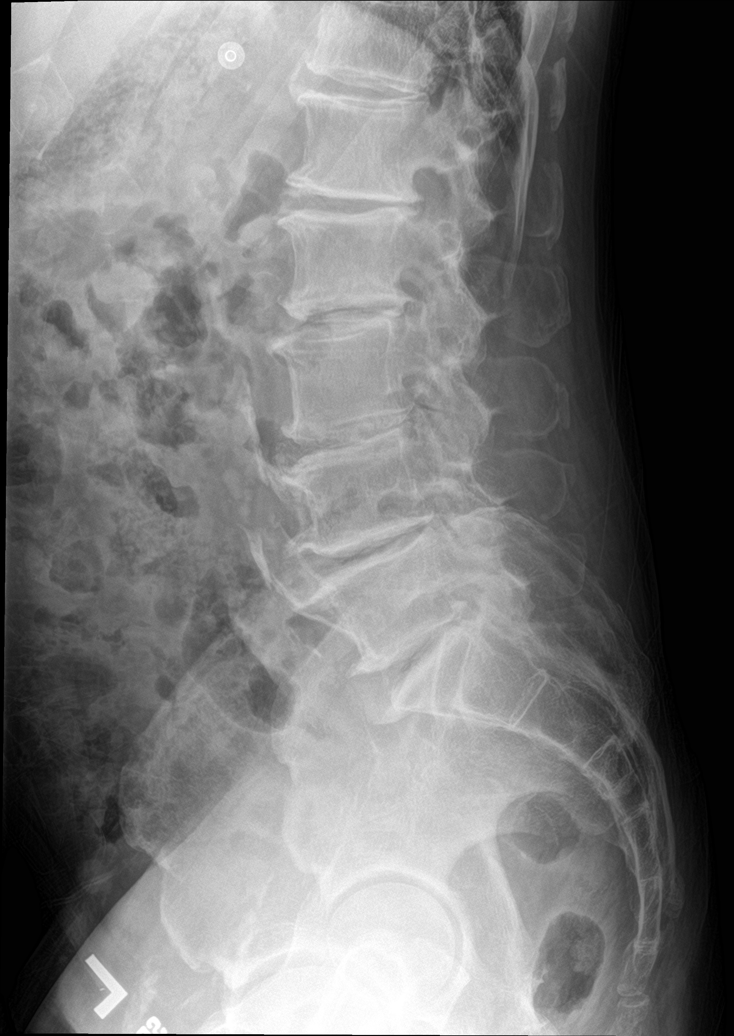

[l-spine spot]
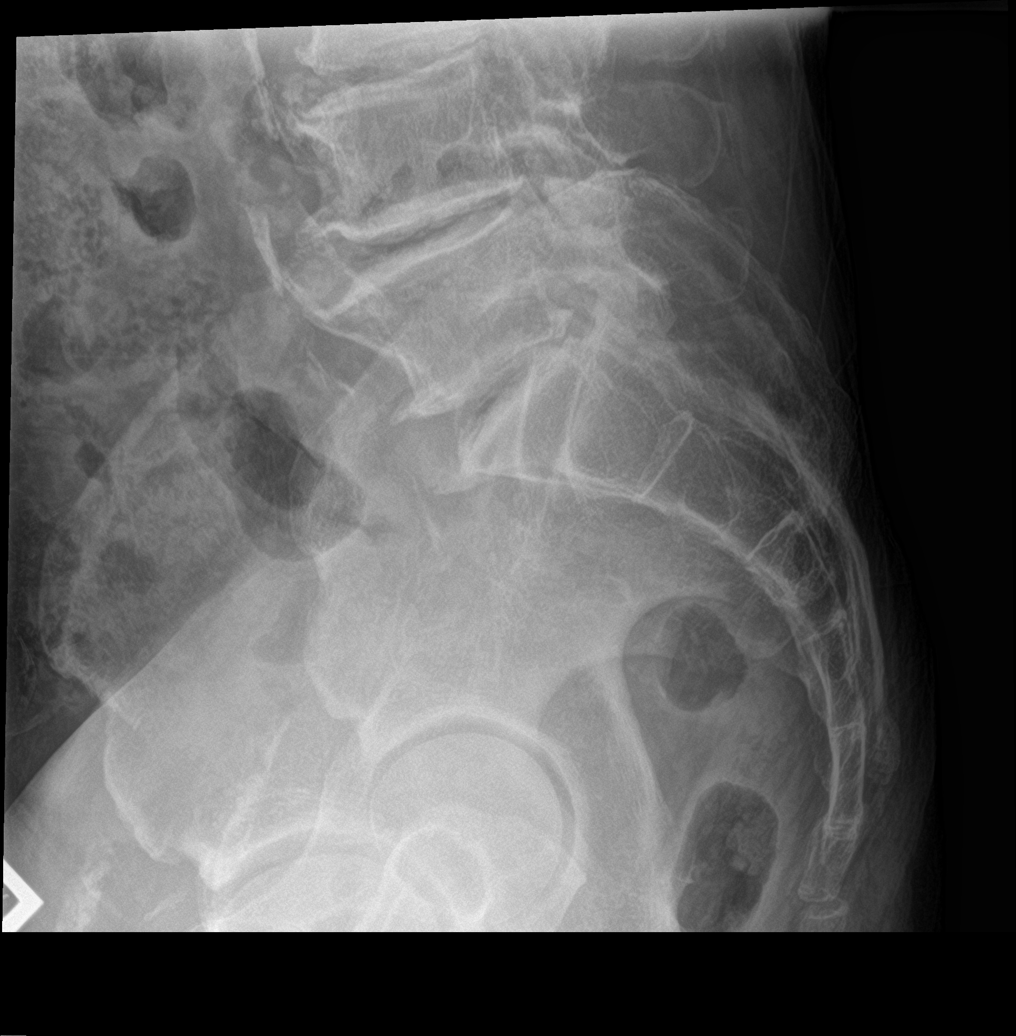

[5 of 5 positions shown; findings below may reference images not displayed]

FINDINGS: Moderate to advanced diffuse degenerative disc disease and facet
disease. Normal alignment. No fracture. SI joints symmetric and
unremarkable. Aortic calcifications. No aneurysm.
IMPRESSION: Moderate to advanced diffuse degenerative disc and facet disease.

No acute bony abnormality.

## 2022-09-16 IMAGING — CR DG CHEST 2V
1 series · 2 of 2 positions shown · non-contrast
Comparison: None.

CLINICAL DATA: Chest pain

EXAM:
CHEST - 2 VIEW

[Series 1: dg chest 2 view · 0.14mm/px · 2 of 2 slices shown]
[im 1/2]
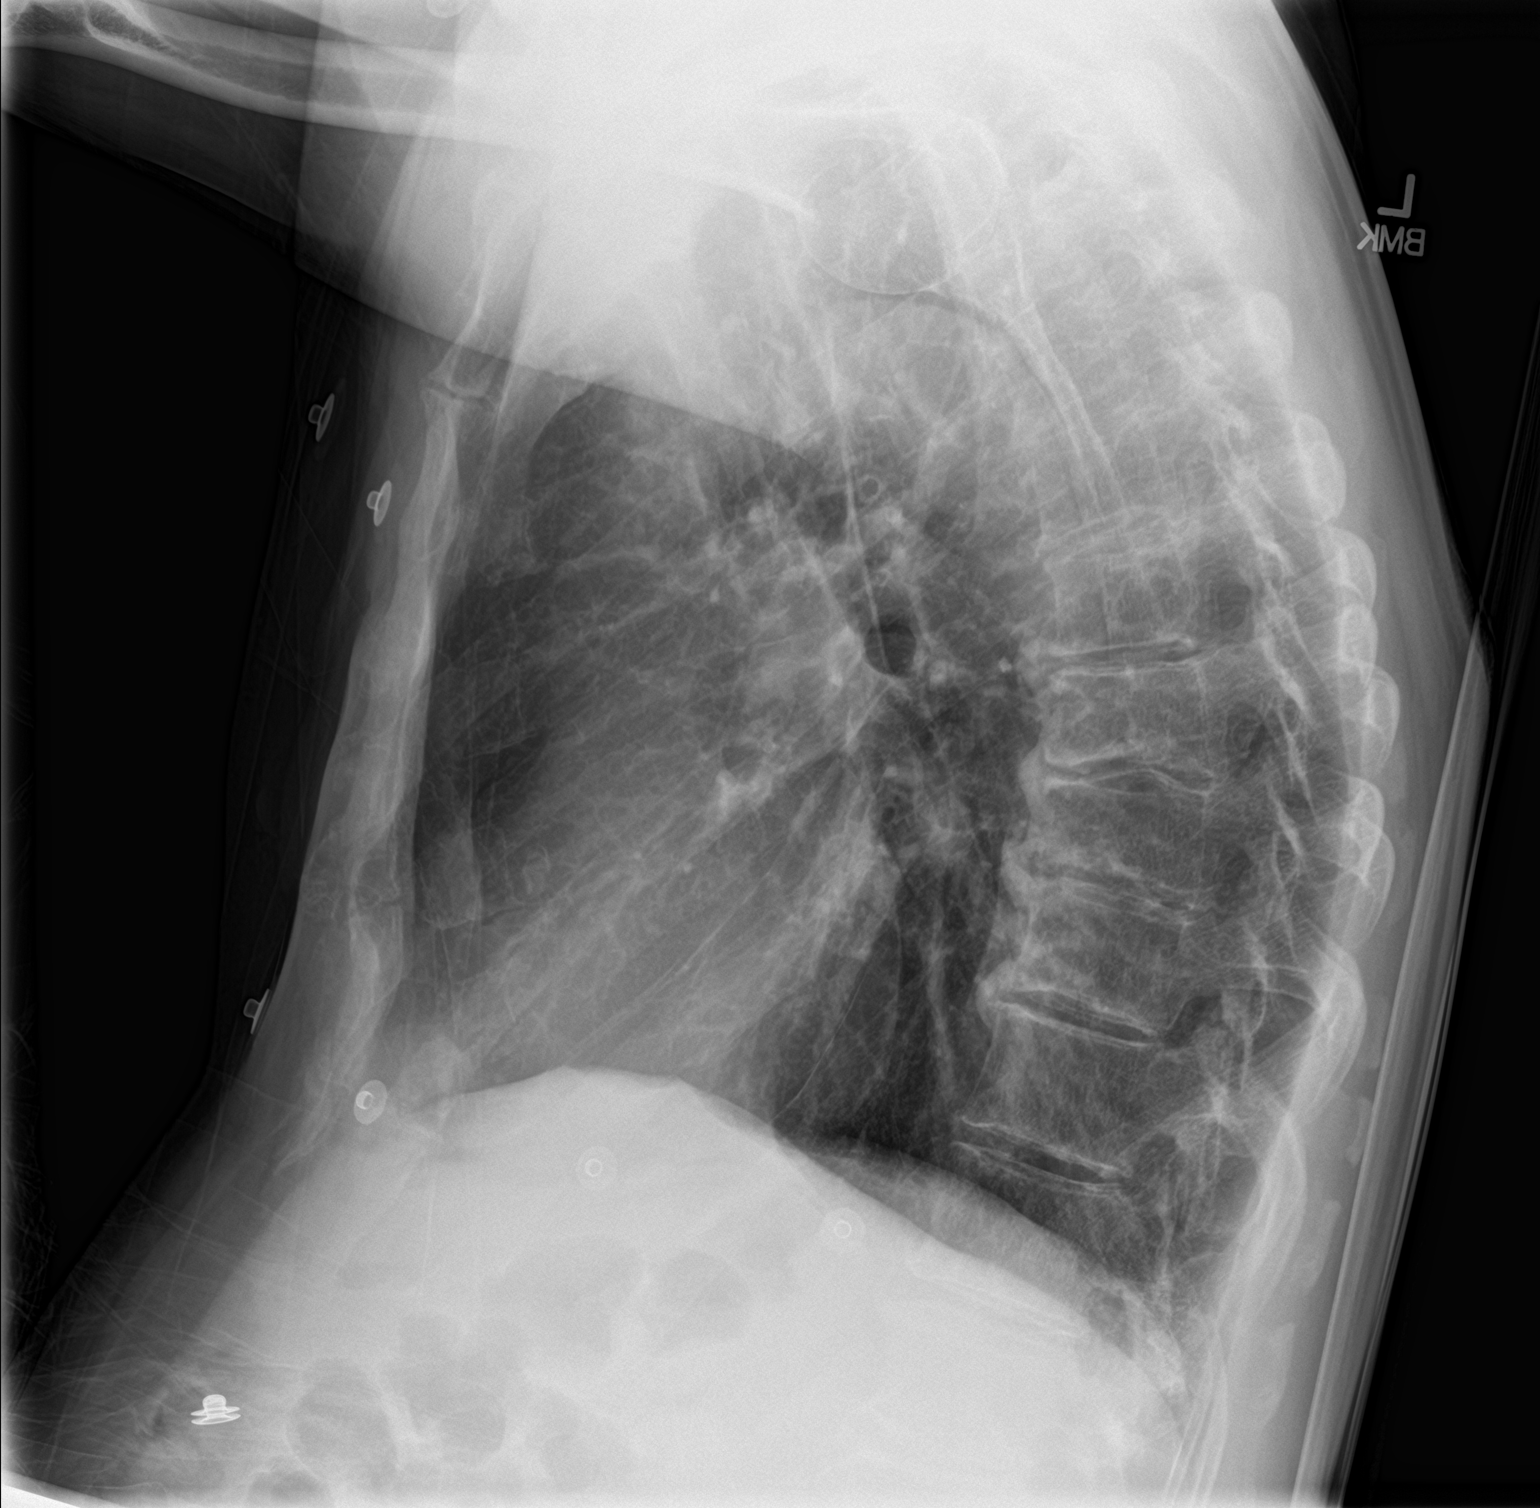
[im 2/2]
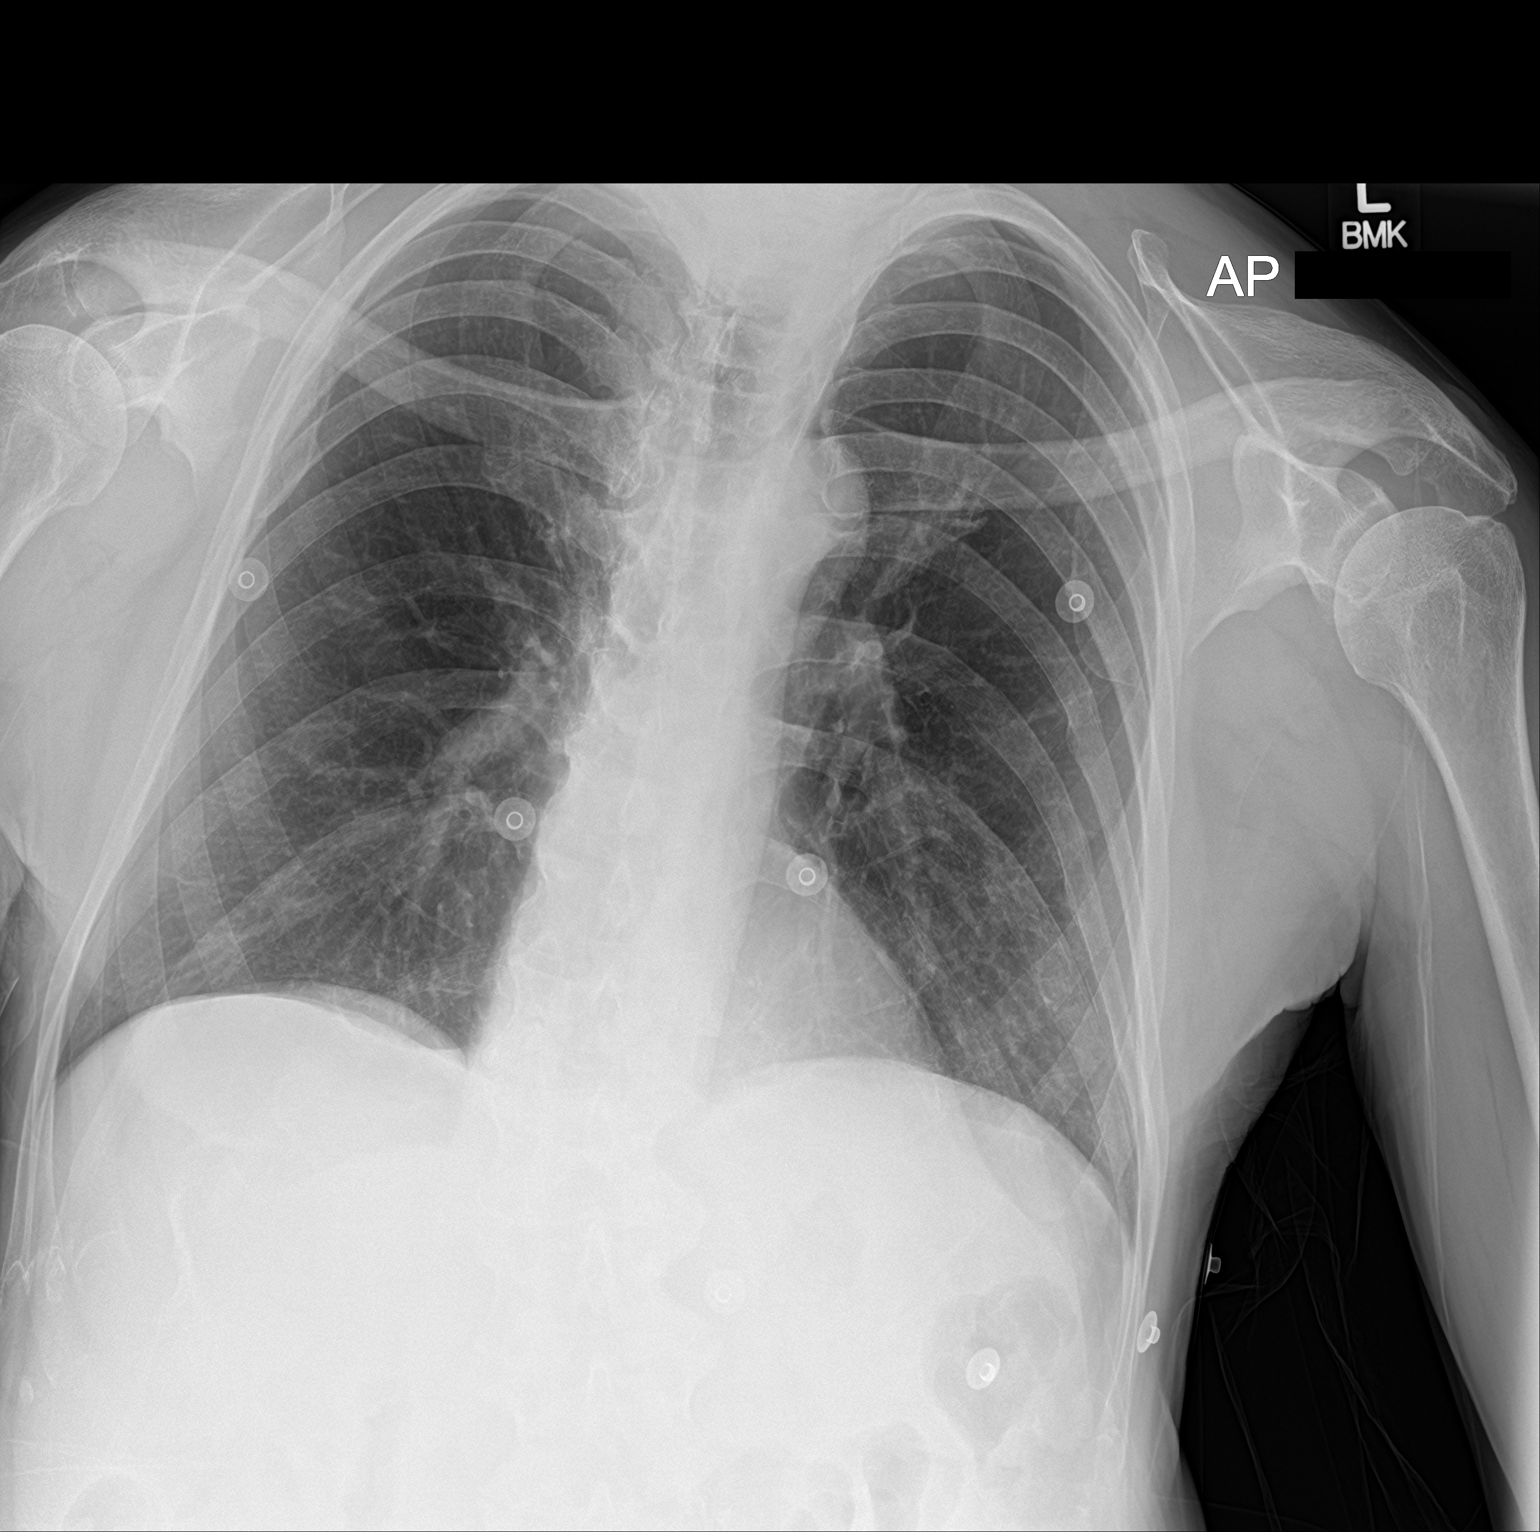

[2 of 2 positions shown; findings below may reference images not displayed]

FINDINGS: The heart size and mediastinal contours are within normal limits.
Both lungs are clear. The visualized skeletal structures are
unremarkable.
IMPRESSION: No active cardiopulmonary disease.

## 2022-09-18 IMAGING — CR DG CHEST 2V
1 series · 2 of 2 positions shown · non-contrast
Comparison: Chest radiograph dated 09/30/2021.

CLINICAL DATA: Chest pain.

EXAM:
CHEST - 2 VIEW

[Series 1: dg chest 2 view · 0.14mm/px · 2 of 2 slices shown]
[im 1/2]
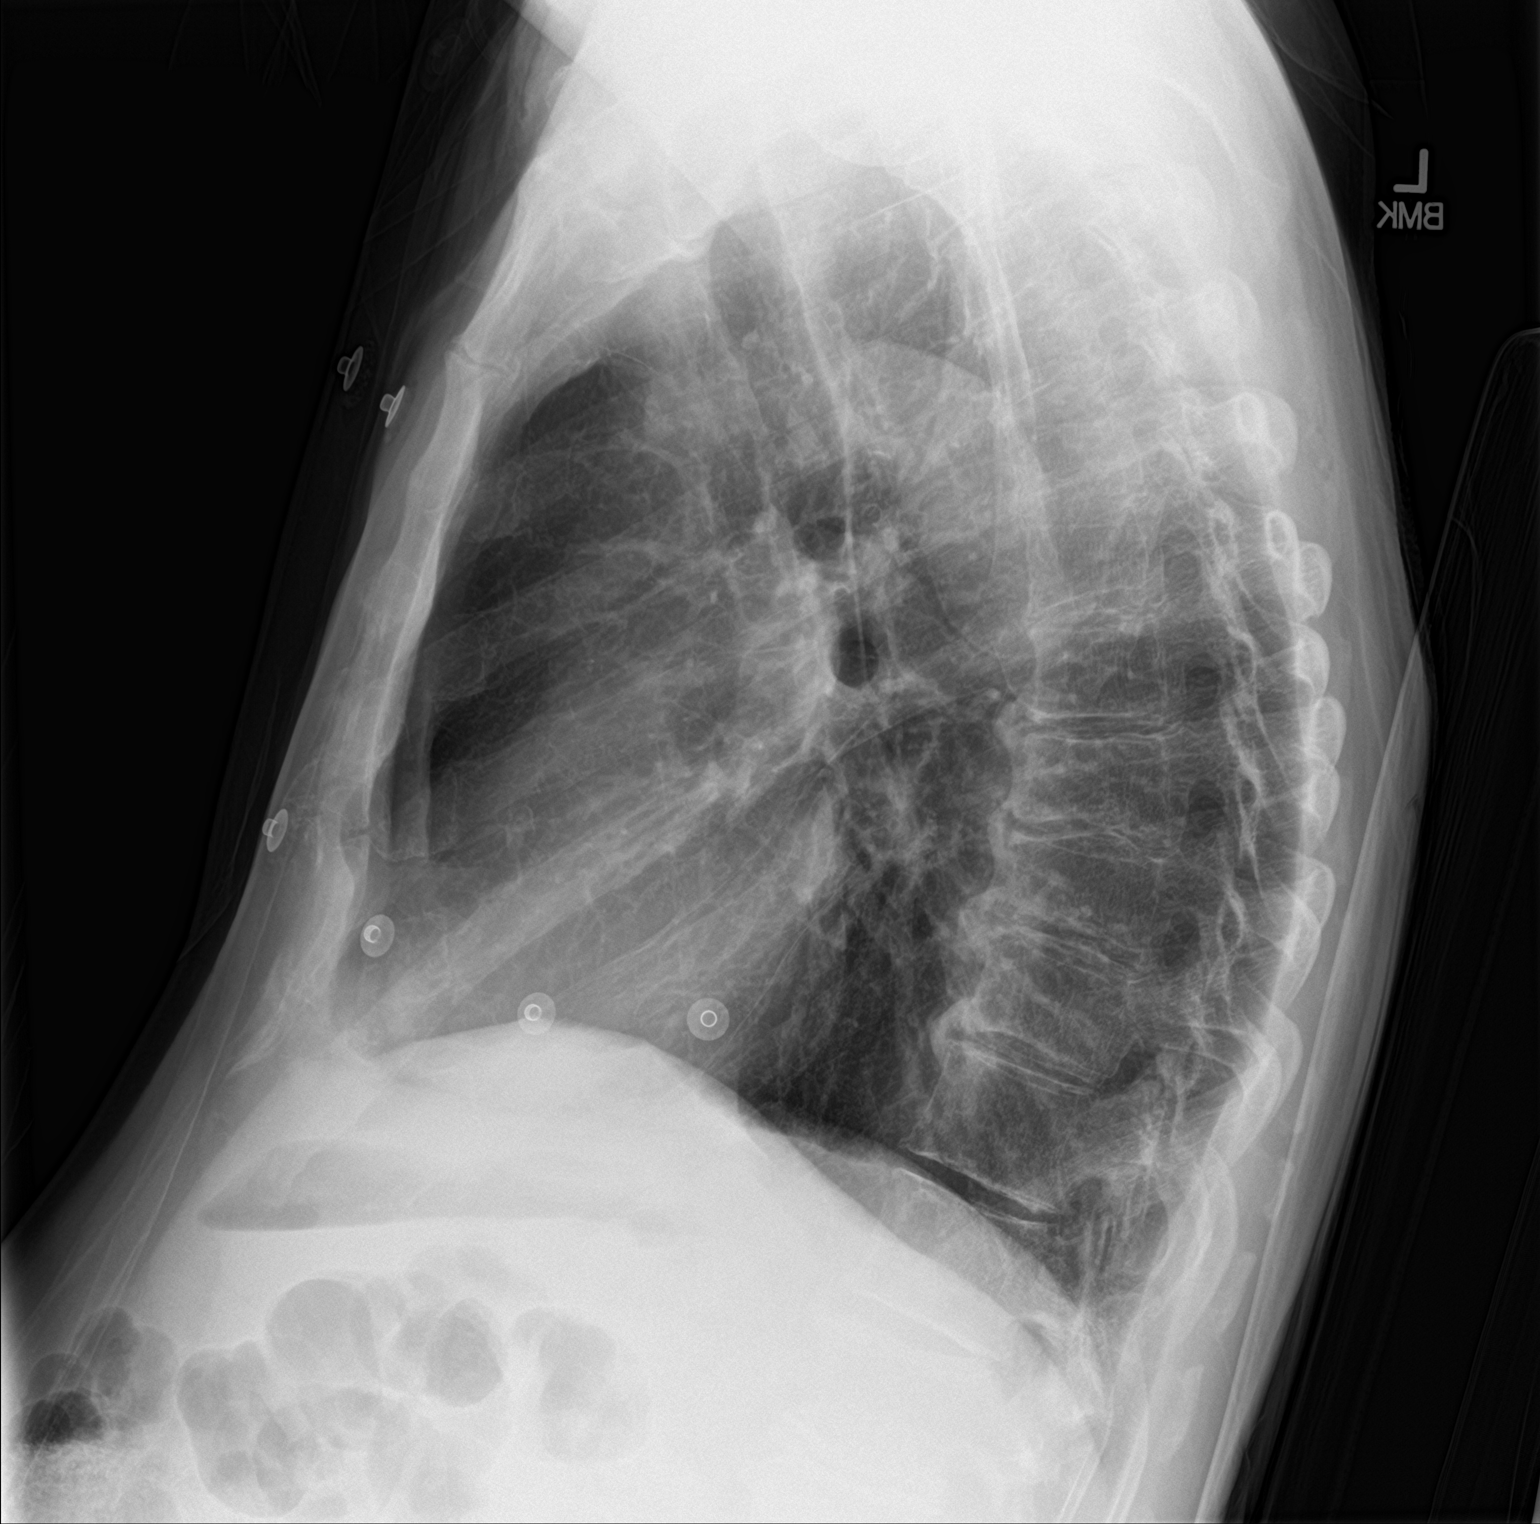
[im 2/2]
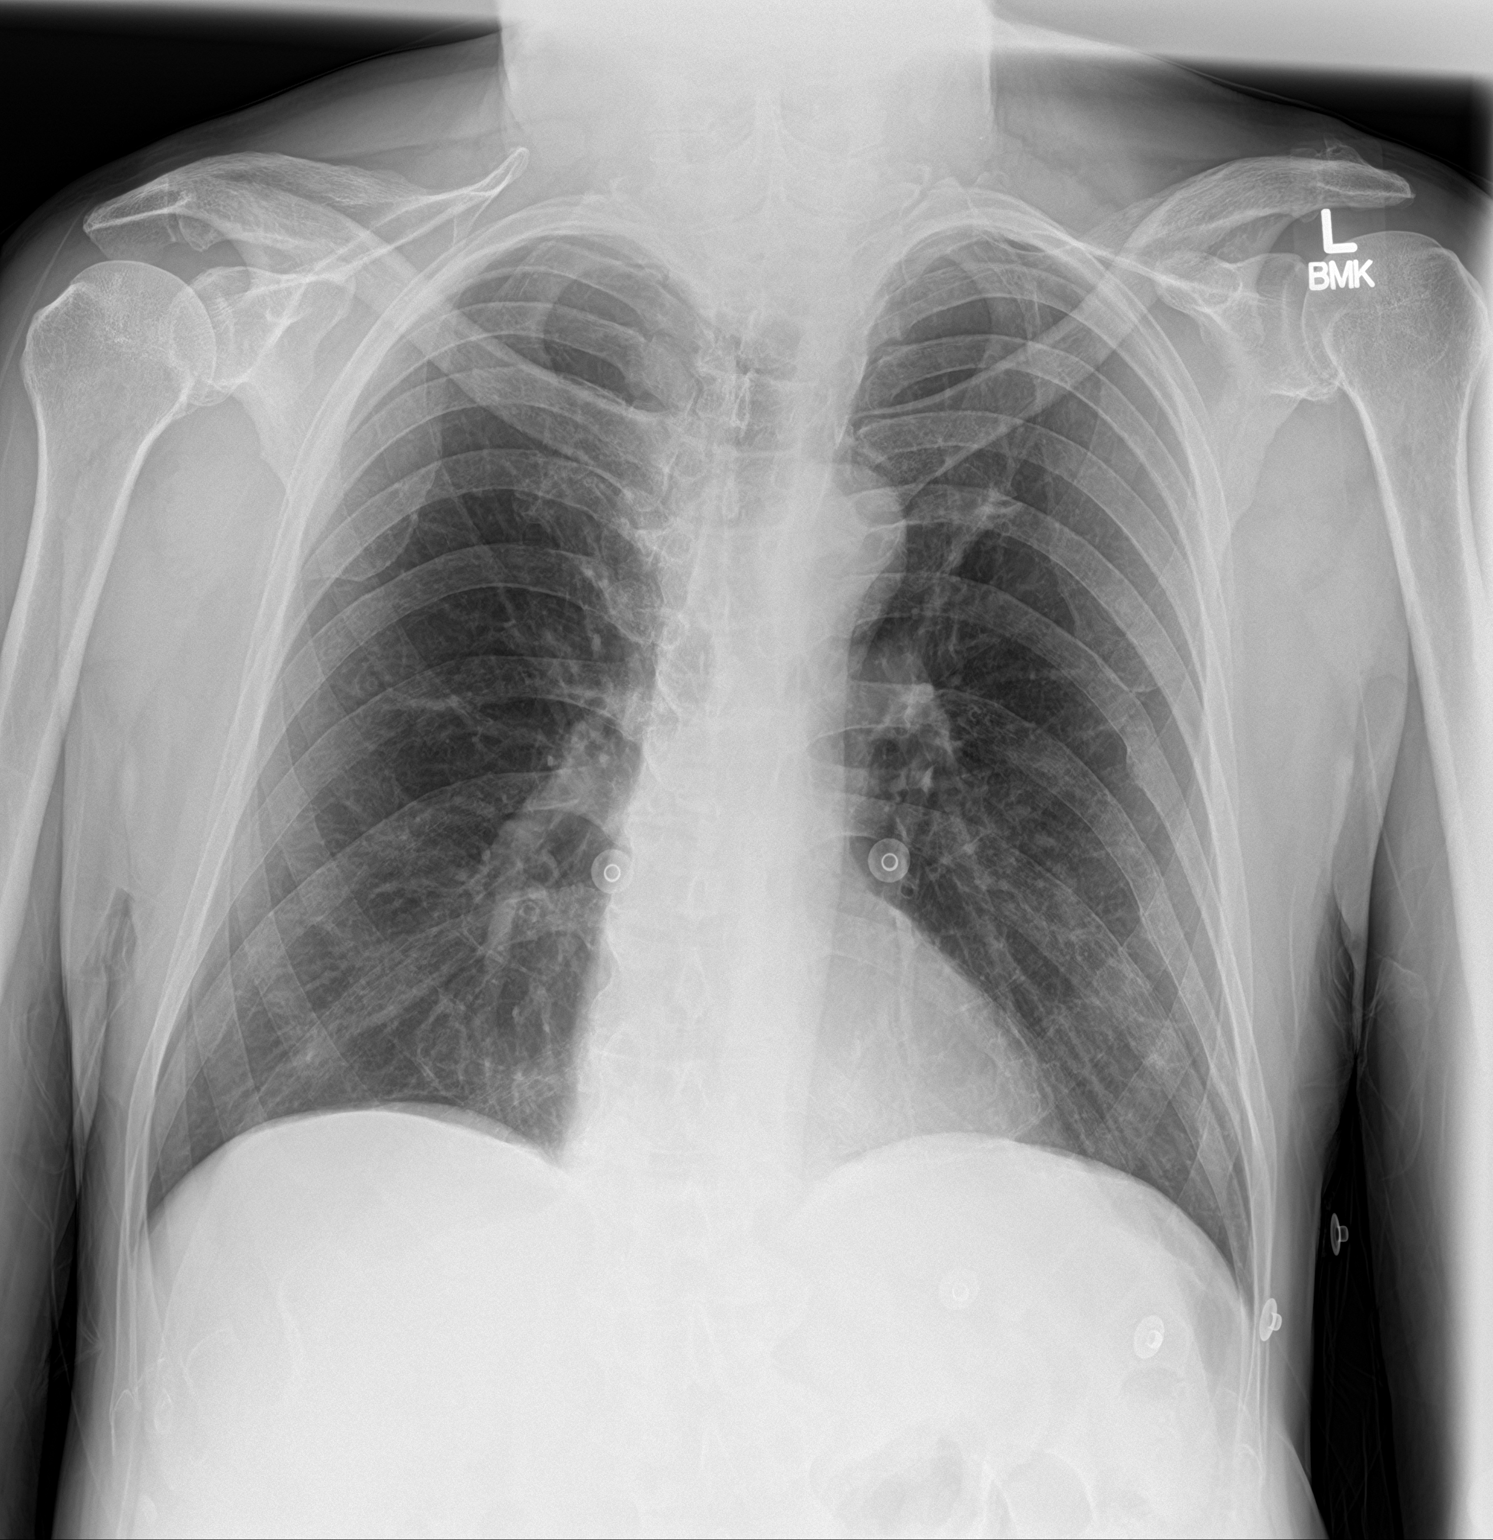

[2 of 2 positions shown; findings below may reference images not displayed]

FINDINGS: No focal consolidation, pleural effusion, pneumothorax. The cardiac
silhouette is within normal limits. No acute osseous pathology.
Degenerative changes of the spine.
IMPRESSION: No active cardiopulmonary disease.

## 2022-09-22 IMAGING — US US EXTREM LOW VENOUS
1 series · 13 of 24 positions shown · non-contrast
Comparison: Chest CTA earlier same day

CLINICAL DATA: Bilateral lower extremity pain. History of smoking.
Patient admitted with pulmonary embolism. Evaluate for DVT.



[Series 1: us venous img lower bilat (dvt) · portal-venous · 13 of 59 slices shown]
[im 1/59]
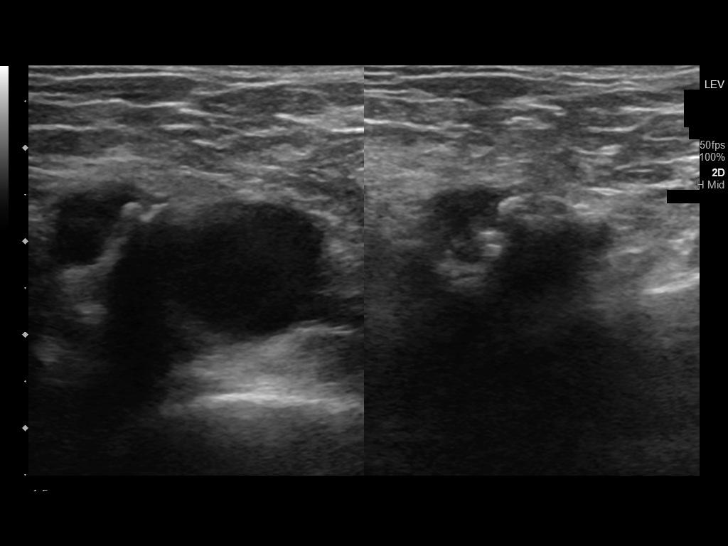
[im 6/59]
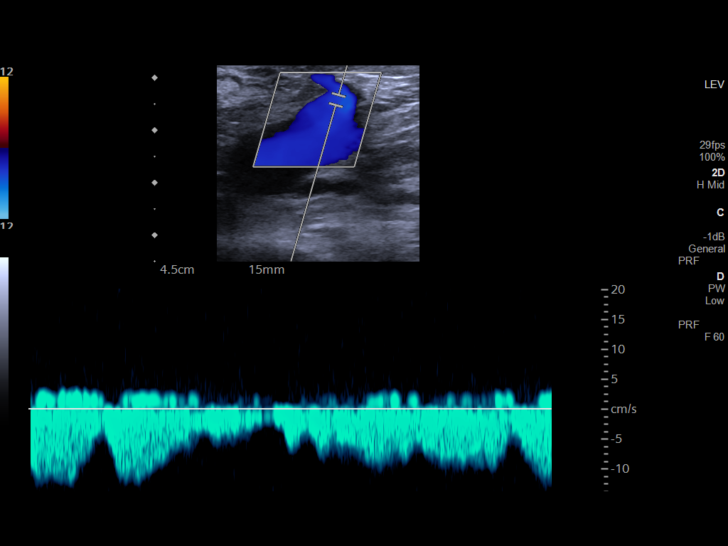
[im 11/59]
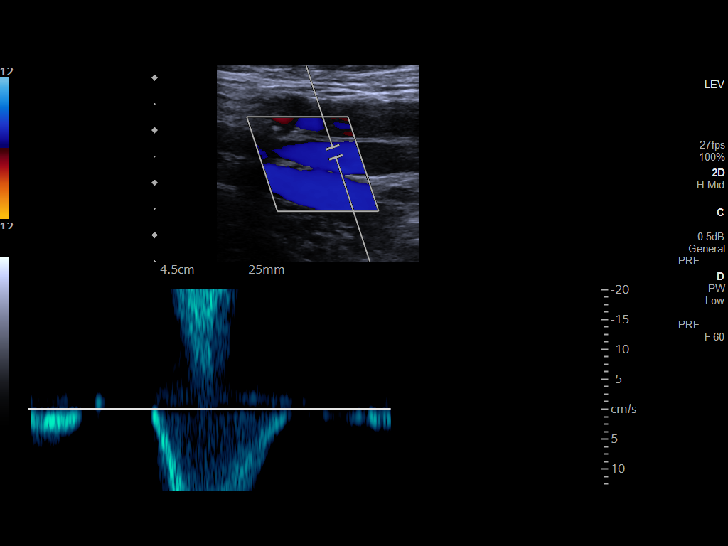
[im 16/59]
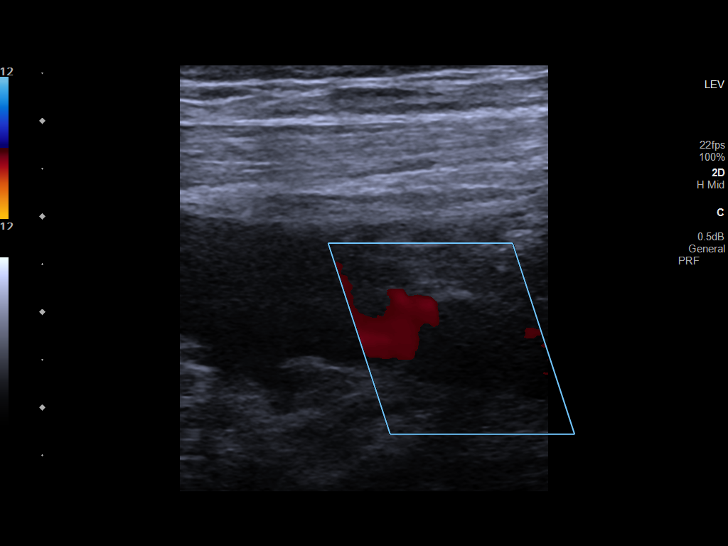
[im 21/59]
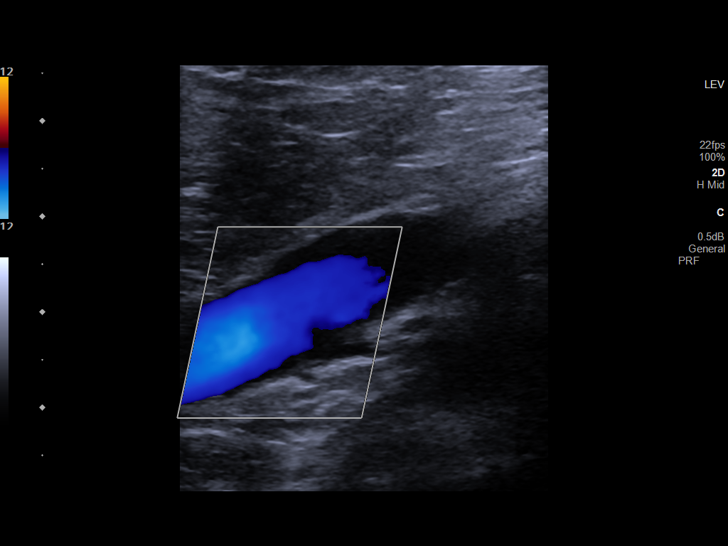
[im 26/59]
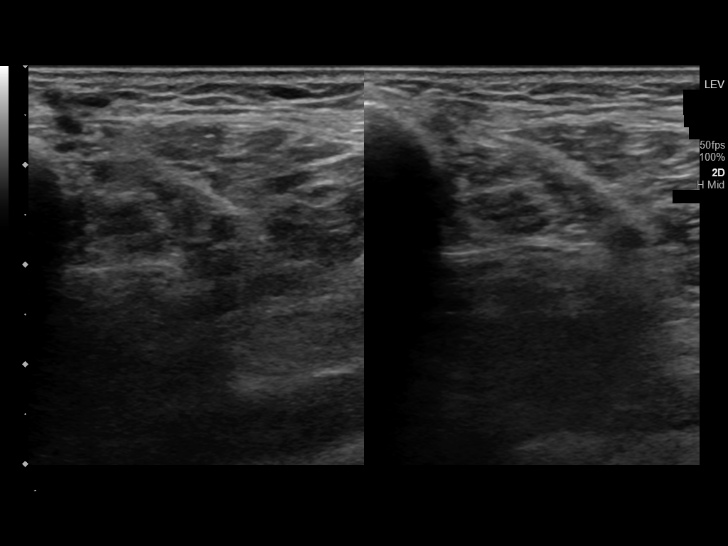
[im 31/59]
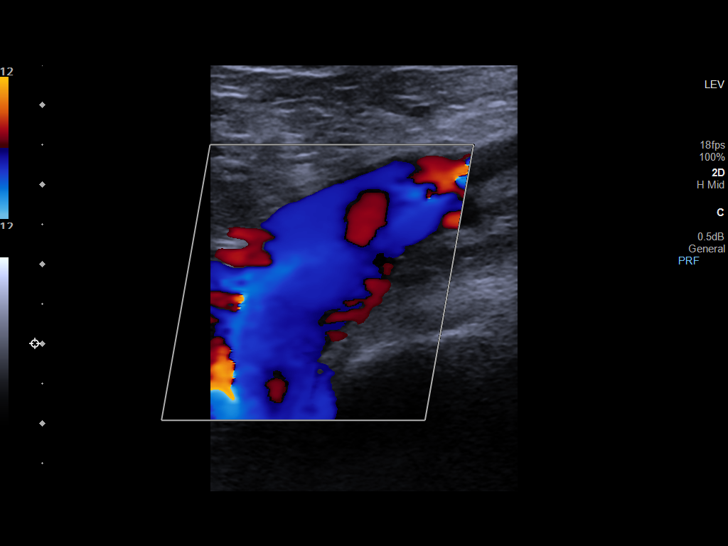
[im 33/59]
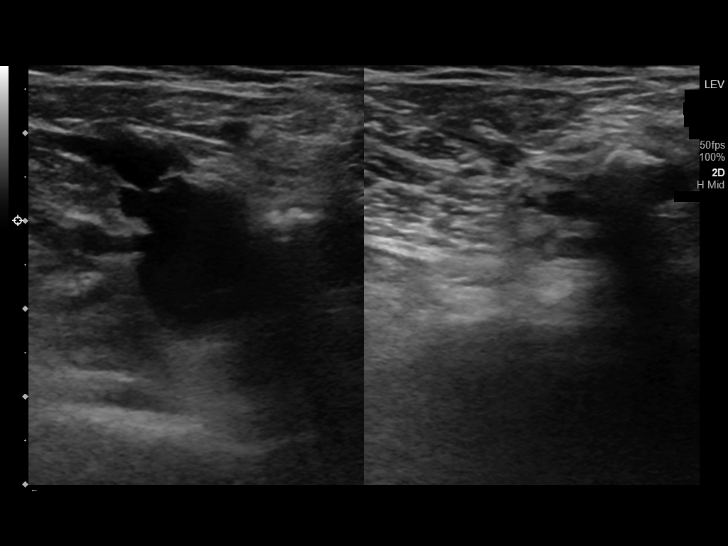
[im 38/59]
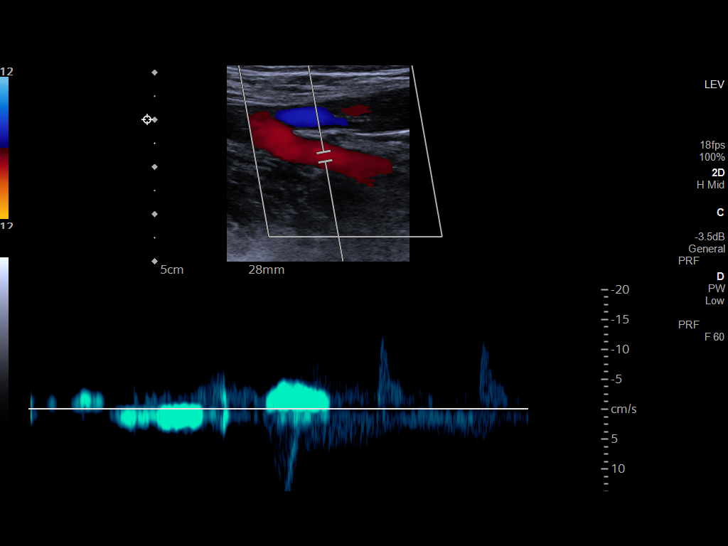
[im 43/59]
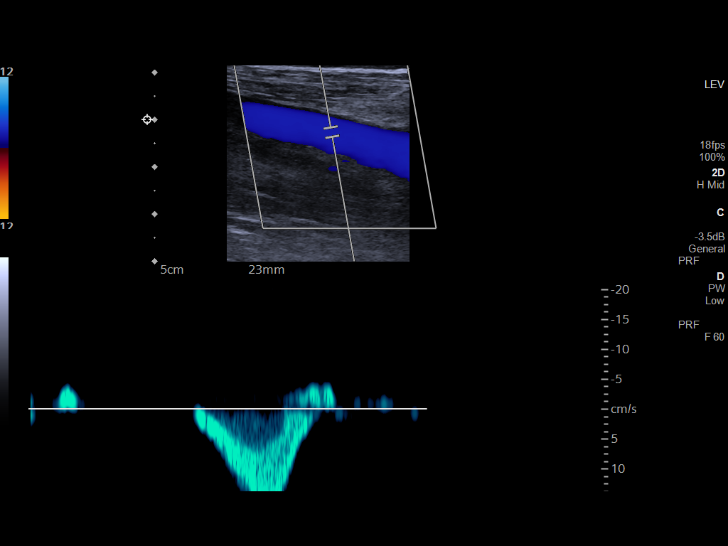
[im 48/59]
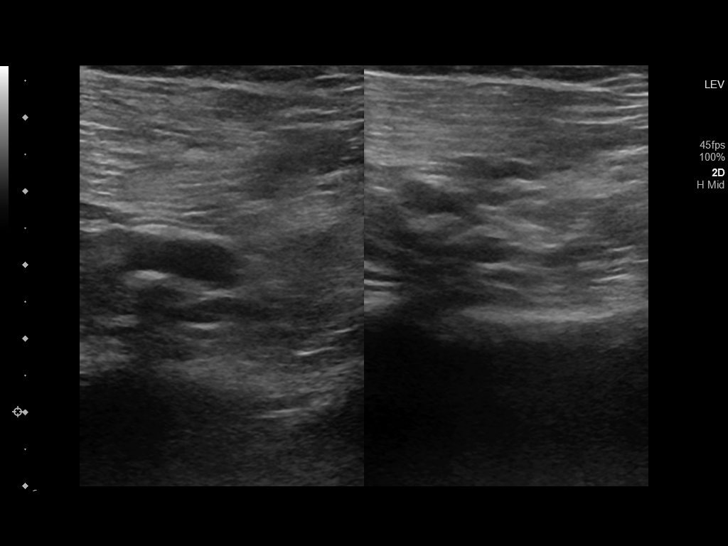
[im 53/59]
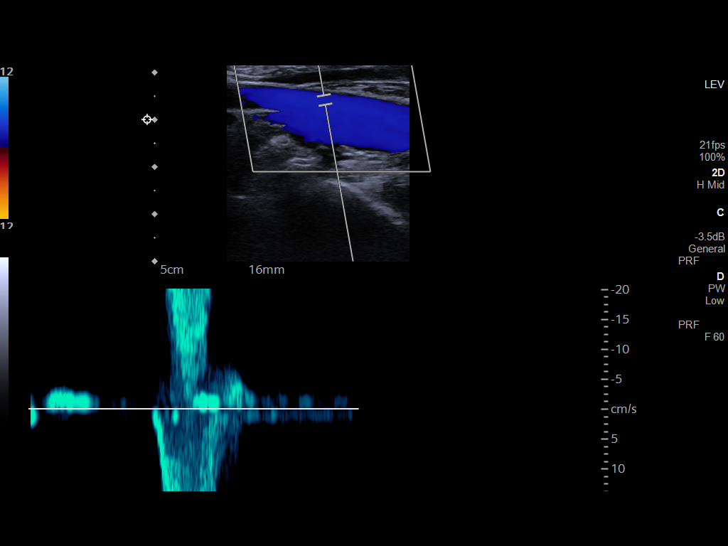
[im 59/59]
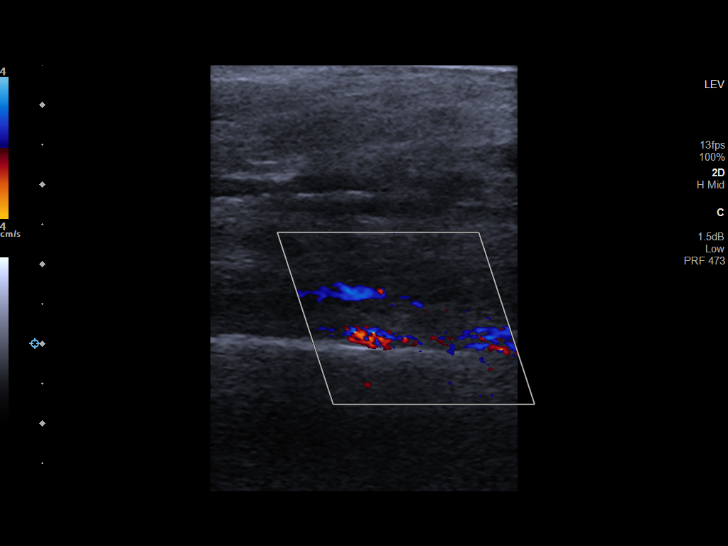

[13 of 24 positions shown; findings below may reference images not displayed]

FINDINGS: RIGHT LOWER EXTREMITY

Common Femoral Vein: No evidence of thrombus. Normal
compressibility, respiratory phasicity and response to augmentation.

Saphenofemoral Junction: No evidence of thrombus. Normal
compressibility and flow on color Doppler imaging.

Profunda Femoral Vein: No evidence of thrombus. Normal
compressibility and flow on color Doppler imaging.

Femoral Vein: No evidence of thrombus. Normal compressibility,
respiratory phasicity and response to augmentation.

Popliteal Vein: No evidence of thrombus. Normal compressibility,
respiratory phasicity and response to augmentation.

Calf Veins: No evidence of thrombus. Normal compressibility and flow
on color Doppler imaging.

Superficial Great Saphenous Vein: No evidence of thrombus. Normal
compressibility.

Venous Reflux:  None.

Other Findings:  None.

LEFT LOWER EXTREMITY

Common Femoral Vein: No evidence of thrombus. Normal
compressibility, respiratory phasicity and response to augmentation.

Saphenofemoral Junction: No evidence of thrombus. Normal
compressibility and flow on color Doppler imaging.

Profunda Femoral Vein: No evidence of thrombus. Normal
compressibility and flow on color Doppler imaging.

Femoral Vein: No evidence of thrombus. Normal compressibility,
respiratory phasicity and response to augmentation.

Popliteal Vein: No evidence of thrombus. Normal compressibility,
respiratory phasicity and response to augmentation.

Calf Veins: No evidence of thrombus. Normal compressibility and flow
on color Doppler imaging.

Superficial Great Saphenous Vein: No evidence of thrombus. Normal
compressibility.

Venous Reflux:  None.

Other Findings:  None.
IMPRESSION: No evidence of DVT within either lower extremity.

## 2022-09-22 IMAGING — CT CT HEAD W/O CM
4 series · 17 of 47 positions shown, 19 images · non-contrast
Comparison: 09/30/2021

CLINICAL DATA: Head trauma, moderate-severe.  Syncope.  Fall.



[Series 2: head wo · axial · 0.46mm/px · z∈[-138,-18]mm · 7 of 33 slices shown, 9 images]
[im 5/33  brain]
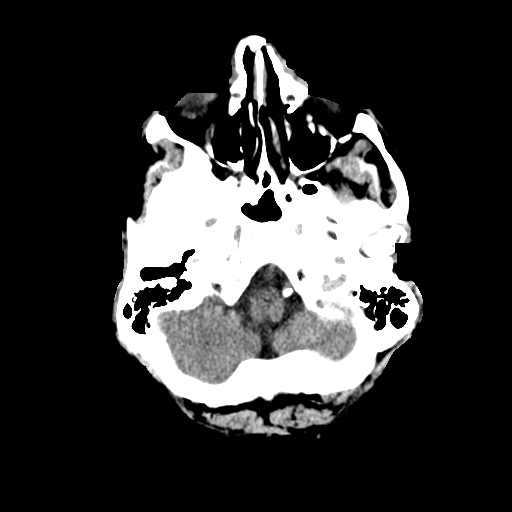
[im 5/33  bone]
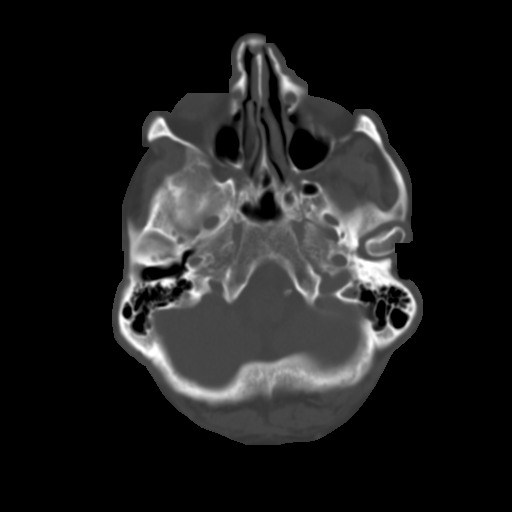
[im 9/33  brain]
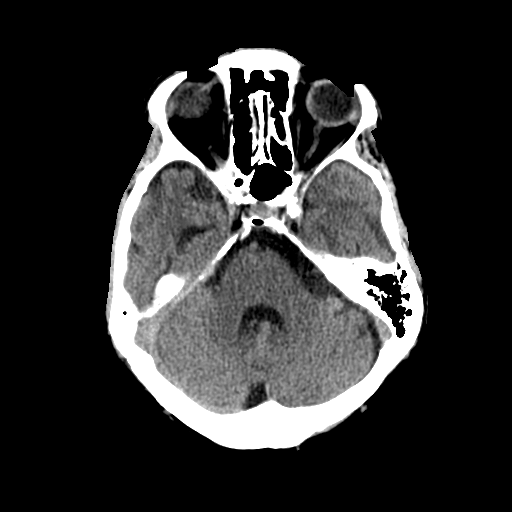
[im 13/33  brain]
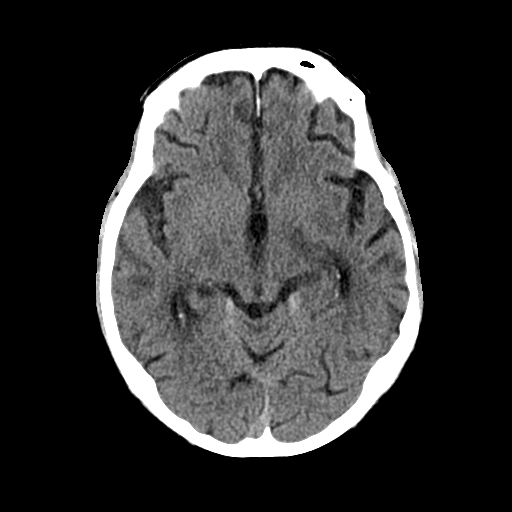
[im 17/33  brain]
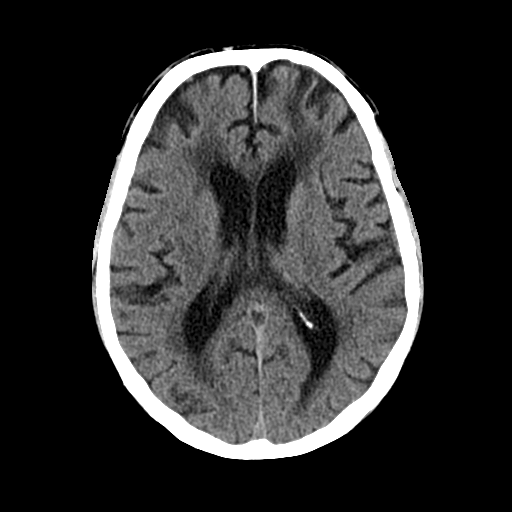
[im 21/33  brain]
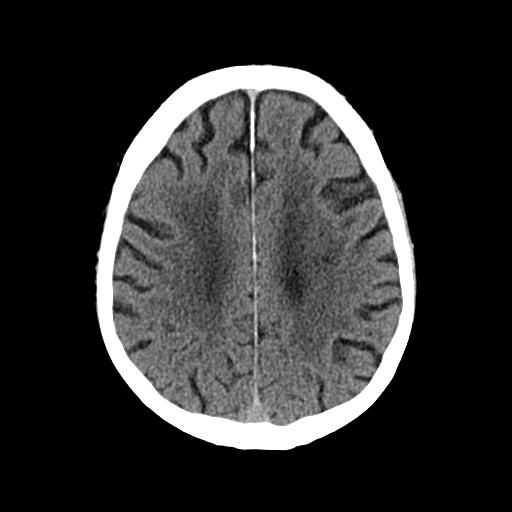
[im 21/33  bone]
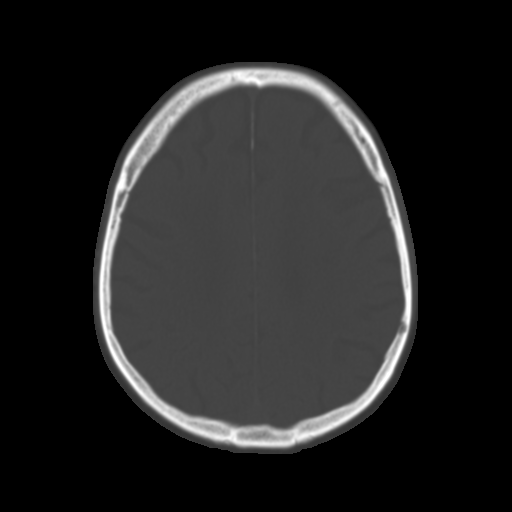
[im 25/33  brain]
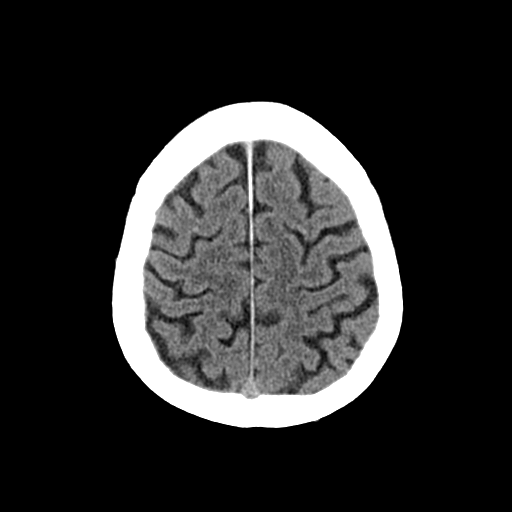
[im 29/33  brain]
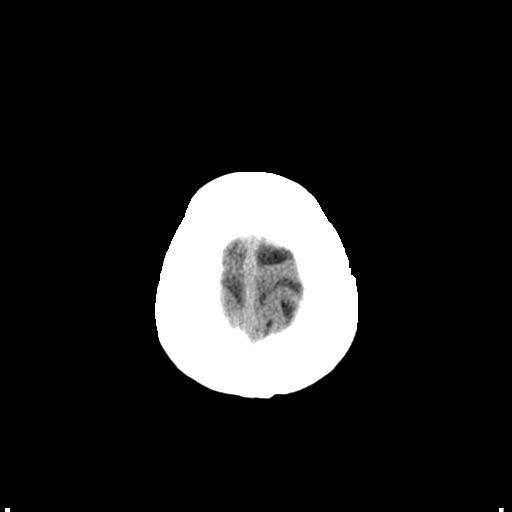

[Series 3: head bone · axial · 0.46mm/px · z∈[-142,-86]mm · 4 of 82 slices shown]
[im 9/82  bone]
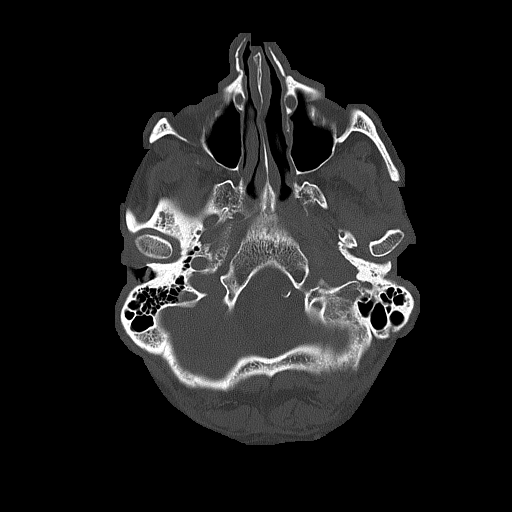
[im 17/82  bone]
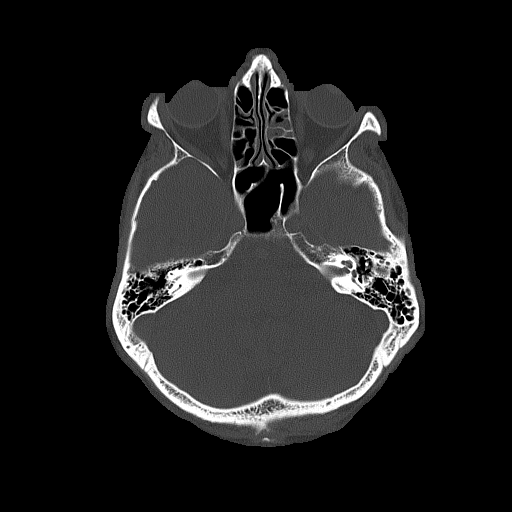
[im 25/82  bone]
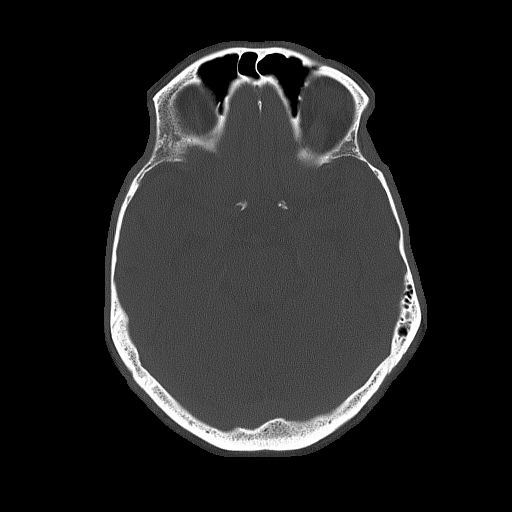
[im 37/82  bone]
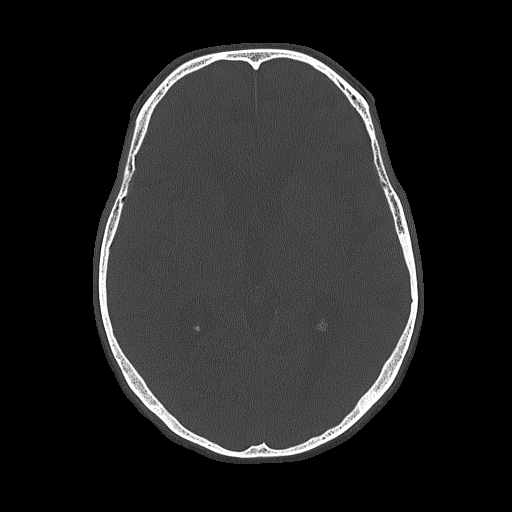

[Series 4: coronal soft tissue · coronal · 0.35mm/px · 3 of 67 slices shown]
[im 23/67  brain]
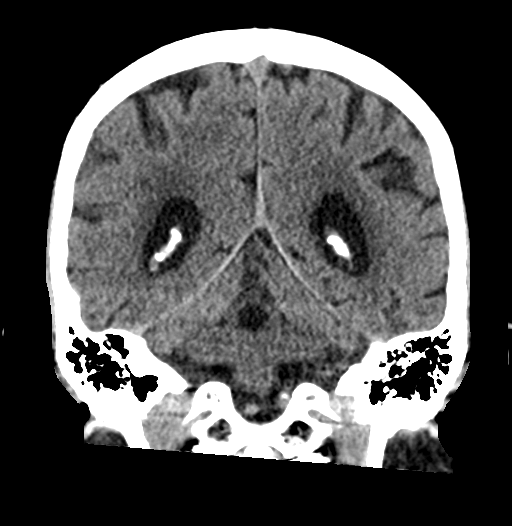
[im 30/67  brain]
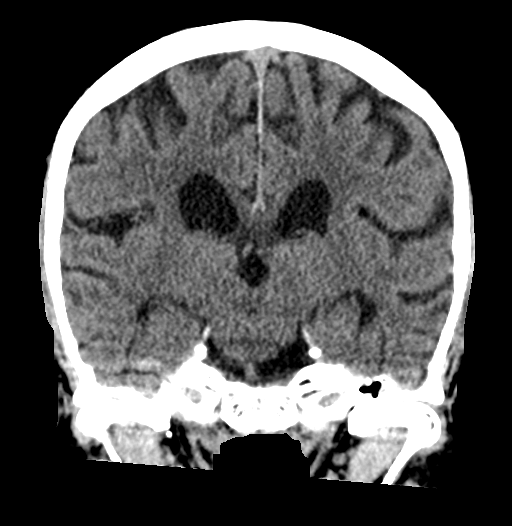
[im 37/67  brain]
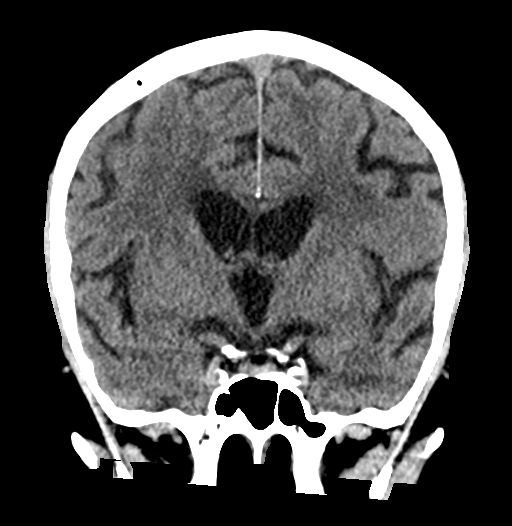

[Series 5: sagittal soft tissue · sagittal · 0.36mm/px · 3 of 55 slices shown]
[im 19/55  brain]
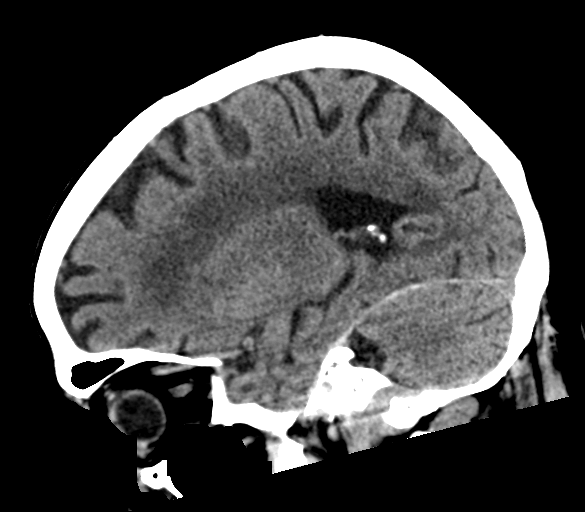
[im 28/55  brain]
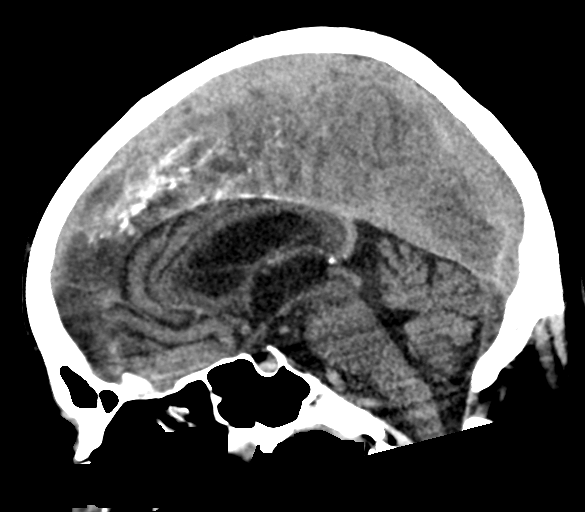
[im 37/55  brain]
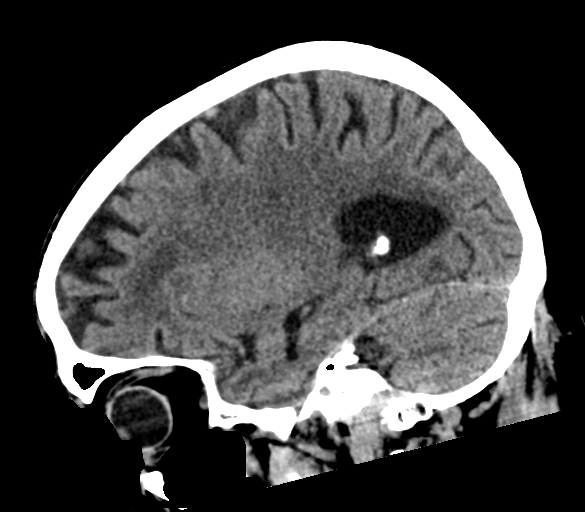

[17 of 47 positions shown; findings below may reference images not displayed]

FINDINGS: Brain: There is atrophy and chronic small vessel disease changes. No
acute intracranial abnormality. Specifically, no hemorrhage,
hydrocephalus, mass lesion, acute infarction, or significant
intracranial injury.

Vascular: No hyperdense vessel or unexpected calcification.

Skull: No acute calvarial abnormality.

Sinuses/Orbits: No acute findings

Other: None
IMPRESSION: Atrophy, chronic microvascular disease.

No acute intracranial abnormality.

## 2022-09-22 IMAGING — CT CT L SPINE W/O CM
3 of 4 series · 10 of 33 positions shown, 11 images · non-contrast
Comparison: Prior radiograph from 09/30/2021 and MRI from
09/25/2021.

CLINICAL DATA: Initial evaluation for acute trauma, fall.



[Series 4: l spine soft · axial · 0.40mm/px · z∈[-692,-560]mm · 2 of 145 slices shown, 3 images]
[im 45/145  soft-tissue]
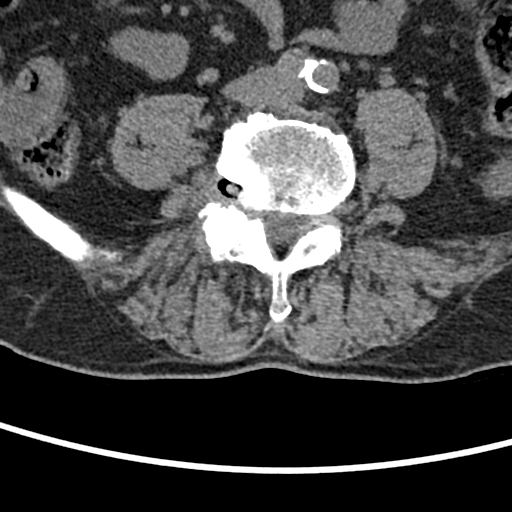
[im 45/145  bone]
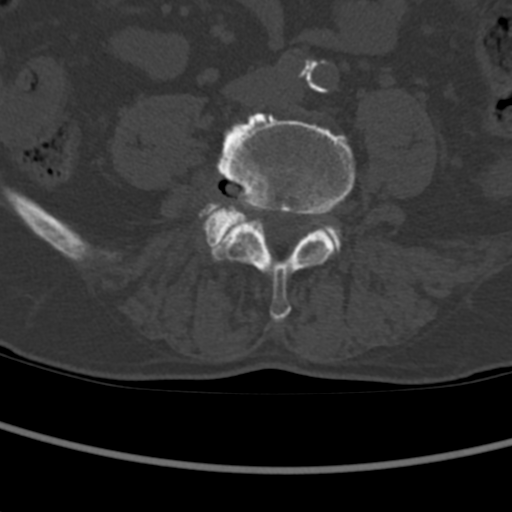
[im 111/145  bone]
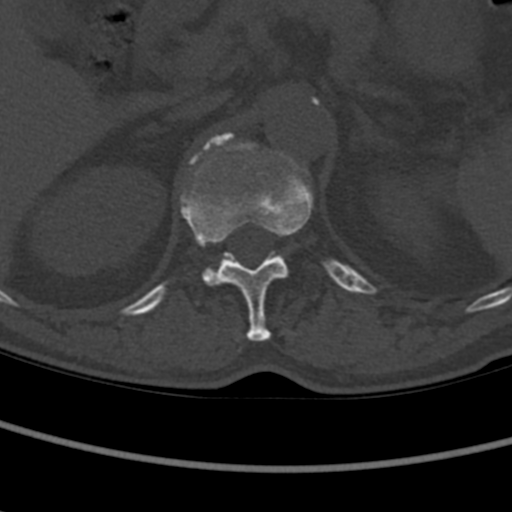

[Series 6: sagittal bone · sagittal · 0.30mm/px · 5 of 74 slices shown]
[im 13/74  bone]
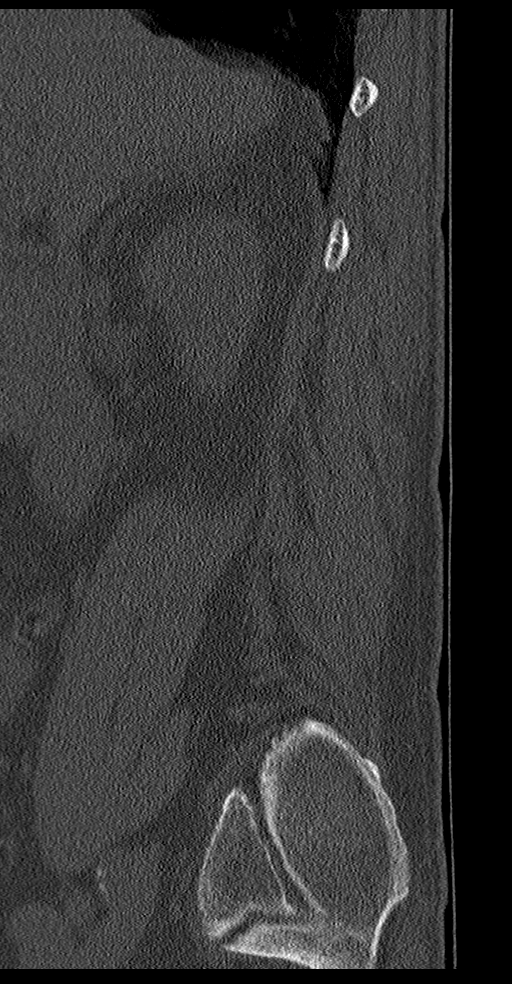
[im 25/74  bone]
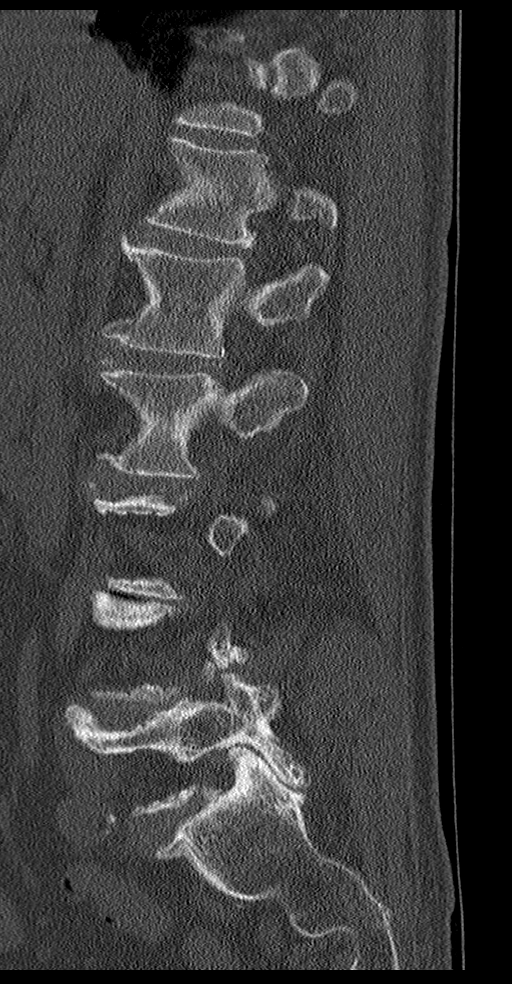
[im 37/74  bone]
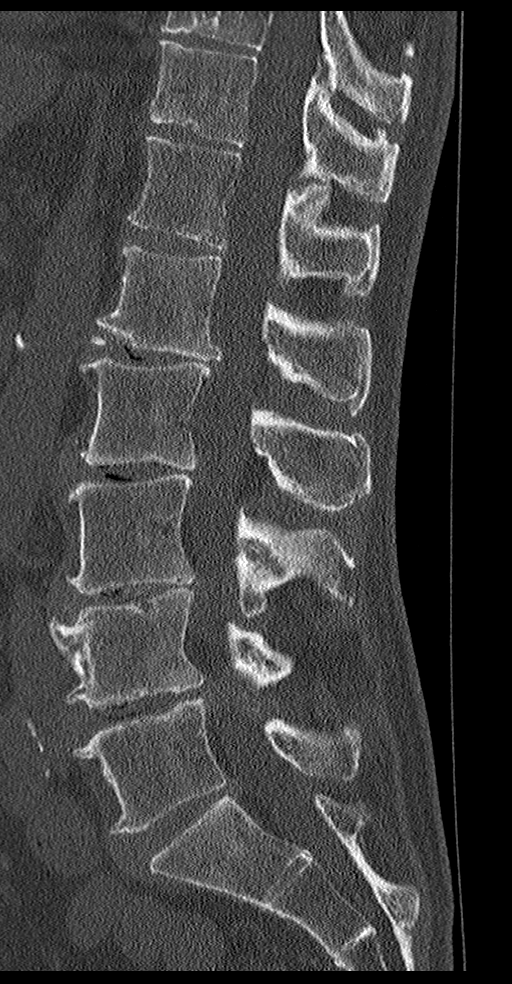
[im 49/74  bone]
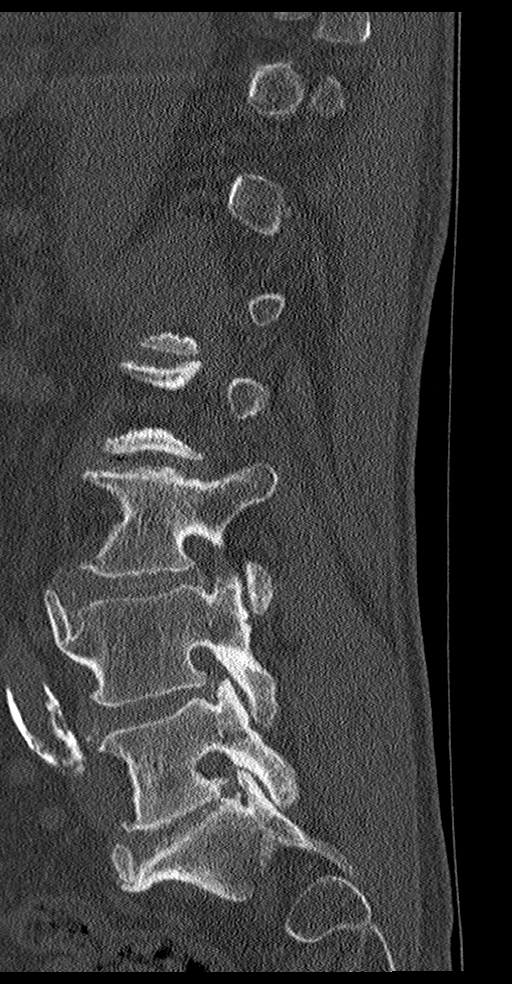
[im 61/74  bone]
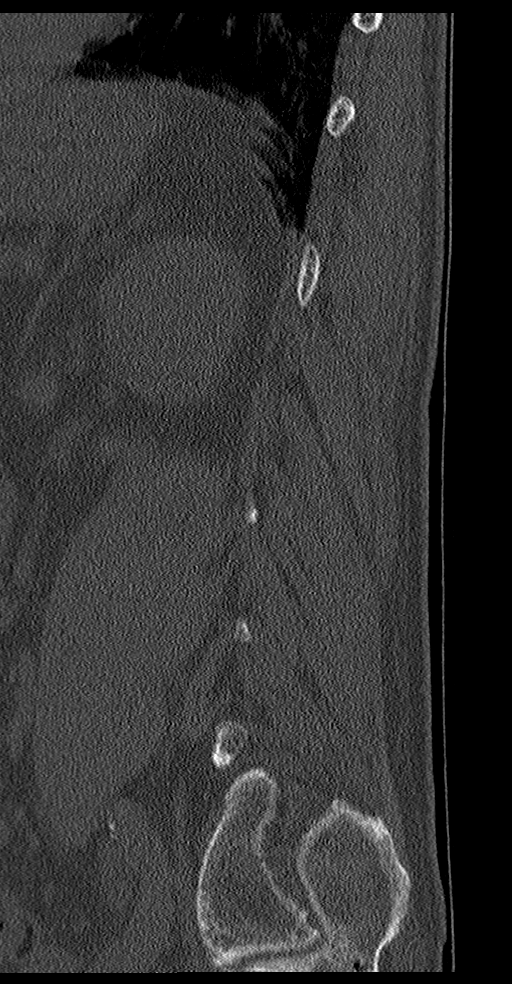

[Series 8: coronal bone · coronal · 0.29mm/px · 3 of 63 slices shown]
[im 13/63  bone]
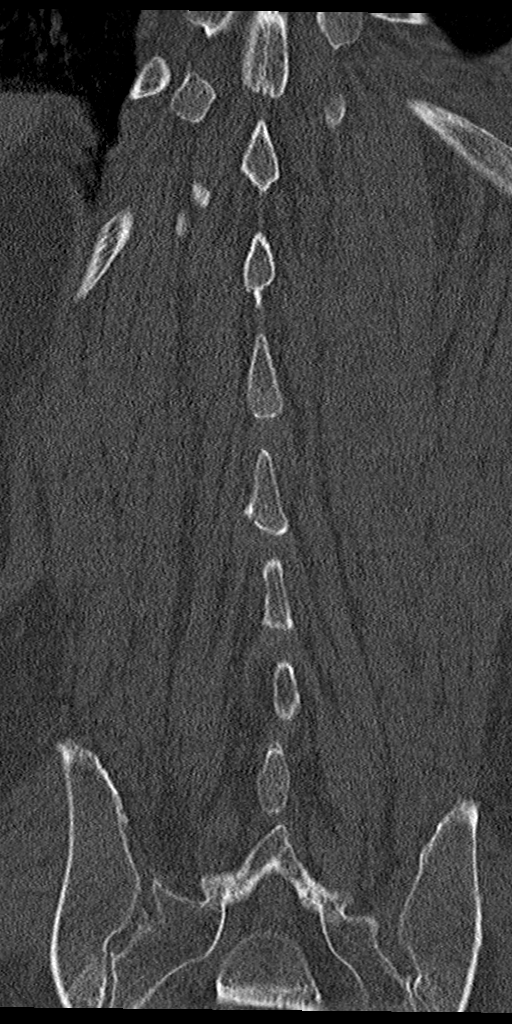
[im 25/63  bone]
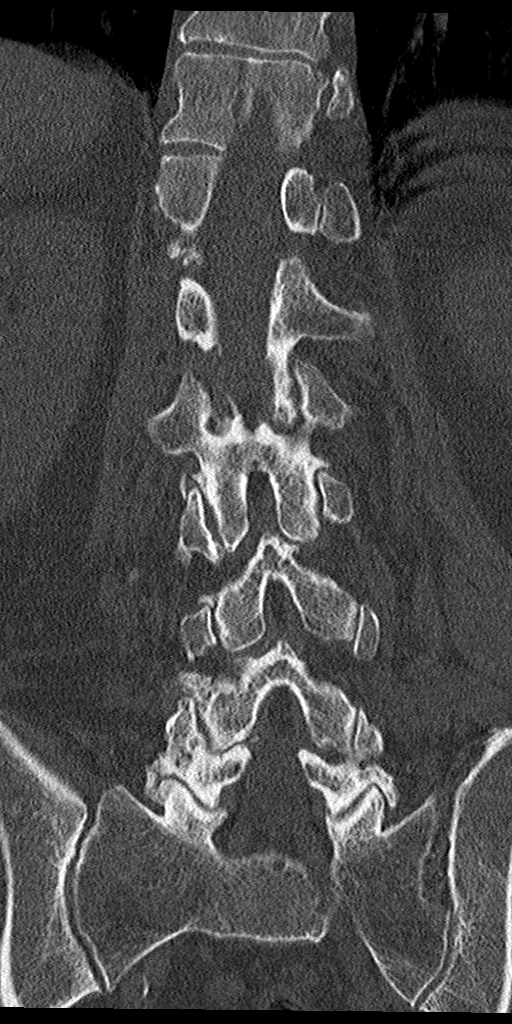
[im 38/63  bone]
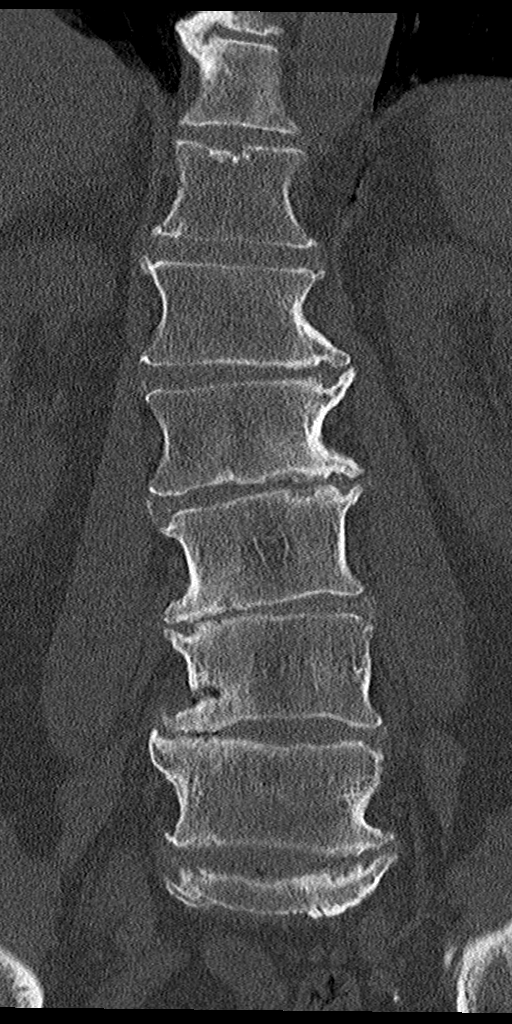

[10 of 33 positions shown; findings below may reference images not displayed]

FINDINGS: Segmentation: Standard. Lowest well-formed disc space labeled the
L5-S1 level.

Alignment: Sigmoid scoliotic curvature of the lumbar spine. Trace
retrolisthesis of L2 on L3. Appearance is stable from prior.

Vertebrae: Vertebral body height maintained without acute or chronic
fracture. Visualized sacrum and pelvis intact. SI joints symmetric
and within normal limits. Remotely healed chronic fracture of the
left posterior twelfth rib noted. Visualized ribs otherwise intact.
Subcentimeter sclerotic lesion noted within the right iliac wing,
likely benign. No other discrete or worrisome osseous lesions.

Paraspinal and other soft tissues: Paraspinous soft tissues
demonstrate no acute finding. Moderate aorto bi-iliac
atherosclerotic disease.

Disc levels: Moderate multilevel degenerative spondylosis and facet
arthrosis seen throughout the lumbar spine, not significantly
changed as compared to recent MRI from 09/25/2021 where these
changes are better seen and characterized. Please refer to this
recent report for a full description of these findings.
IMPRESSION: 1. No acute traumatic injury within the lumbar spine.
2. Sigmoid scoliosis with moderate multilevel degenerative
spondylosis and facet arthrosis, not significantly changed as
compared to recent MRI from 09/25/2021 where these changes are
better seen. These changes are fully described on this recent exam,
please refer to prior report for a full description of these
findings.

Aortic Atherosclerosis ([id number]-NH0.0).

## 2022-09-23 IMAGING — CR DG CHEST 2V
3 series · 3 of 3 positions shown · non-contrast
Comparison: October 02, 2021

CLINICAL DATA: Recently diagnosed pulmonary embolism with recurrent
chest pain.

EXAM:
CHEST - 2 VIEW

[chest pa]
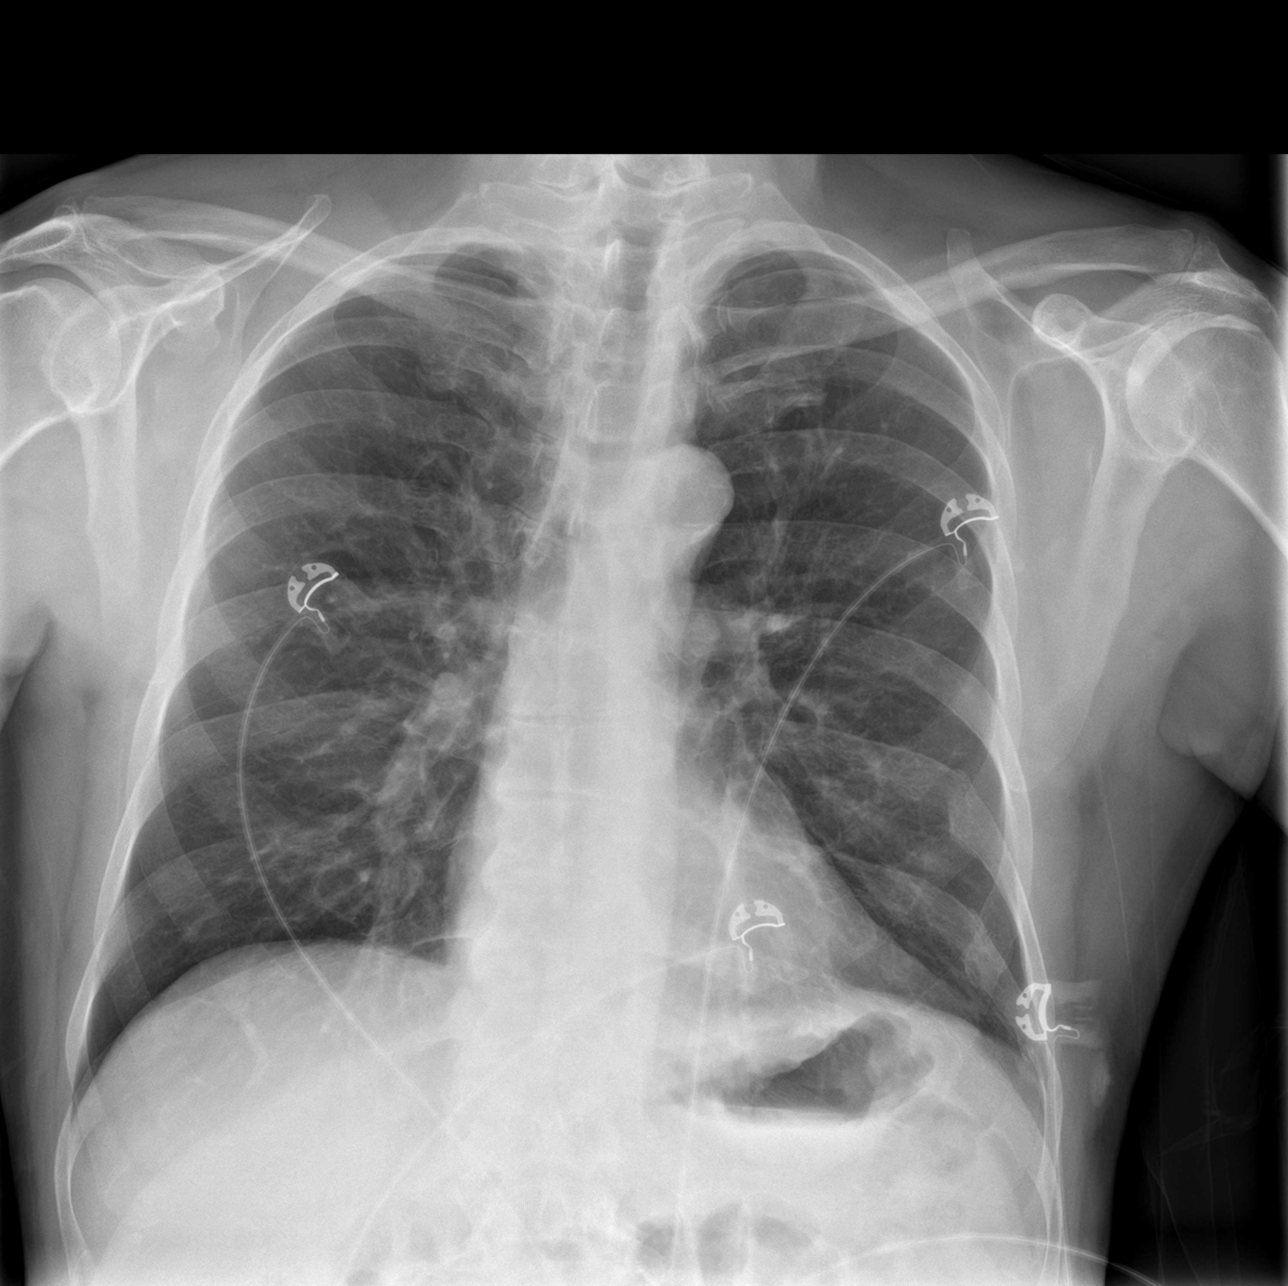

[chest lat (1 of 2)]
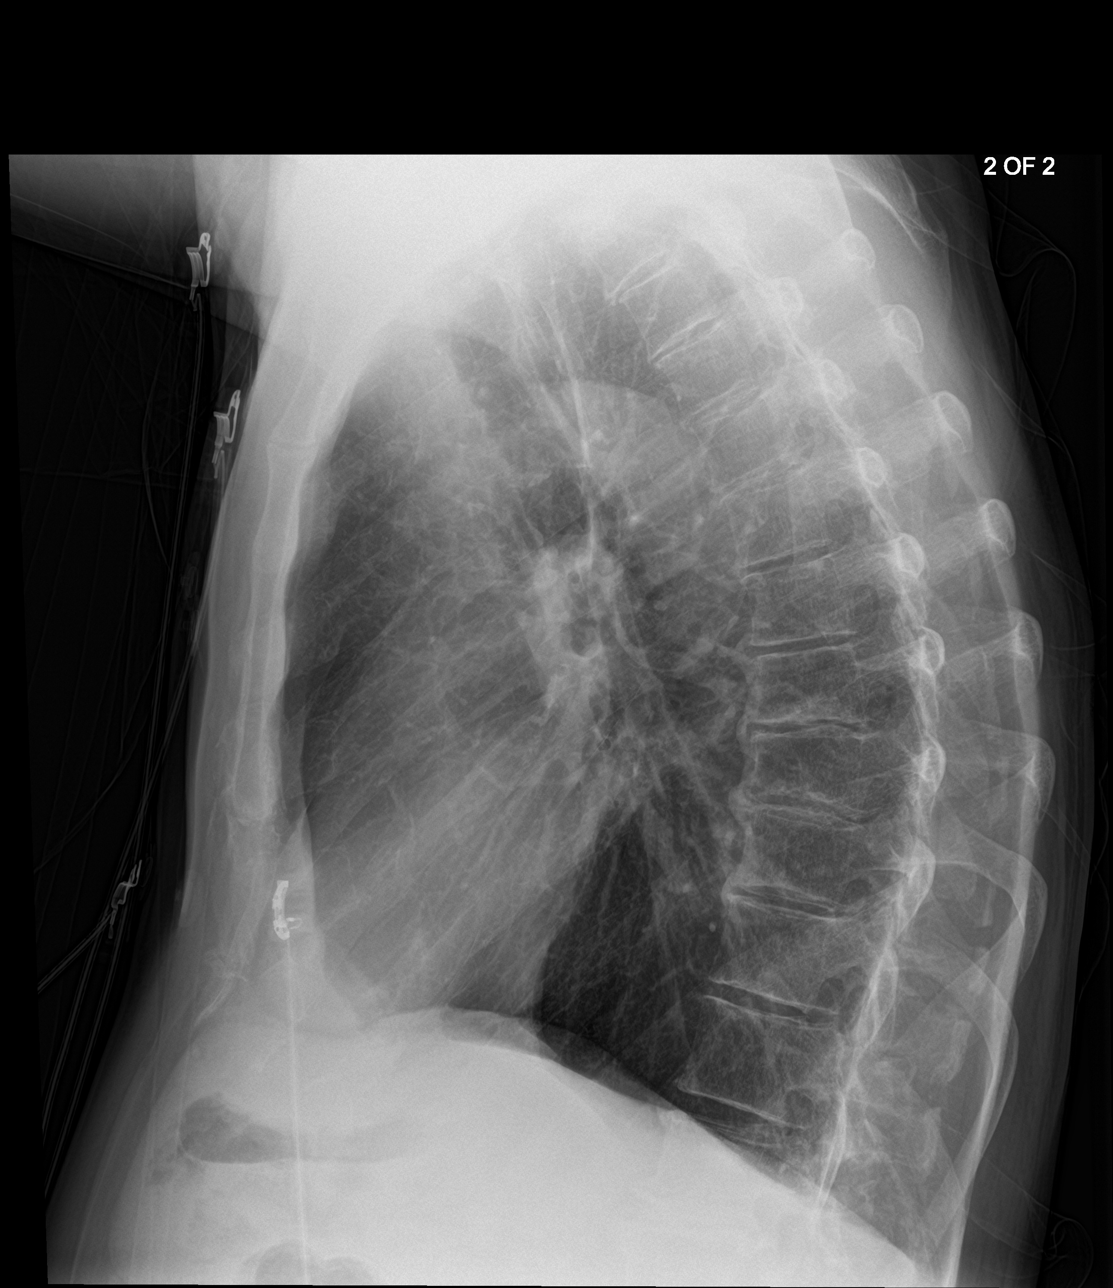

[chest lat (2 of 2)]
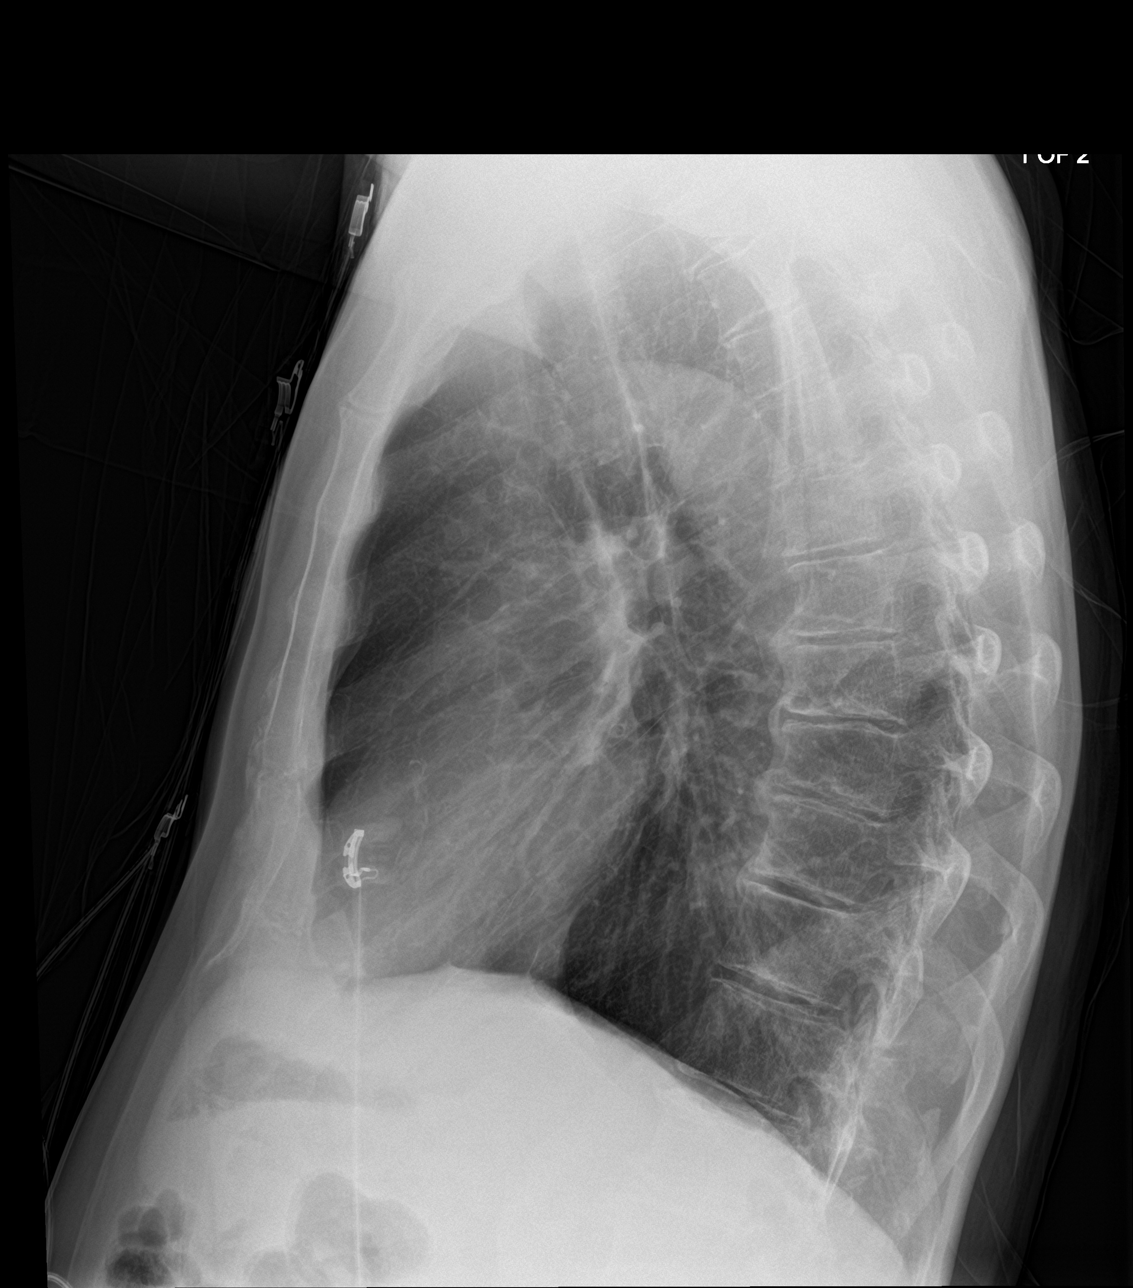

[3 of 3 positions shown; findings below may reference images not displayed]

FINDINGS: The heart size and mediastinal contours are within normal limits.
Very mild atelectasis is seen within the bilateral lung bases. There
is no evidence of a pleural effusion or pneumothorax. Chronic eighth
and ninth left rib fractures are seen.
IMPRESSION: Very mild bibasilar atelectasis.

## 2022-09-25 NOTE — Congregational Nurse Program (Signed)
  Dept: [phone]   Congregational Nurse Program Note  Date of Encounter: 09/25/2022 Client to [facility] for vital sign check. BP 138/78 (BP Location: Left Arm, Patient Position: Sitting, Cuff Size: Normal)   Pulse (!) 1 and is not longer on the loading dock. He continues to [facility] for some food and alcohol. He also reports he has lost his food stamp card and will need assistance replacing it. RN to assist as needed as client does not have a phone. Support given, no other needs at this time.  Past Medical History: Past Medical History:  Diagnosis Date   Hep C w/o coma, chronic ([place])    Hypertension    Polysubstance abuse ([place])     Encounter Details:  CNP Questionnaire - 09/25/22 1242       Questionnaire   Ask client: Do you give verbal consent for me to treat you today? Yes    Student Assistance N/A    Location Patient [place]    Visit Setting with Client Organization    Patient Status Unhoused    Insurance Medicaid    Insurance/Financial Assistance Referral N/A    Medication N/A    Medical Provider Yes    Screening Referrals Made N/A    Medical Referrals Made N/A    Medical Appointment Made N/A    Recently w/o PCP, now 1st time PCP visit completed due to CNs referral or appointment made N/A    Food Have Food Insecurities   Has food stamps which have been decreased   Transportation N/A   walks or [place]   Housing/Utilities No permanent housing    Interpersonal Safety N/A    Interventions Advocate/Support    Abnormal to Normal Screening Since Last CN Visit N/A    Screenings CN Performed Blood Pressure;Pulse Ox    Sent Client to Lab for: N/A    Did client attend any of the following based off CNs referral or appointments made? N/A   client has been getting lunch at a small church on [address] he is aware of the food at the [place]   ED Visit Averted N/A    Life-Saving Intervention  Made N/A

## 2022-09-26 IMAGING — CR DG CHEST 1V PORT
1 series · 1 of 1 positions shown · non-contrast
Comparison: Chest radiograph dated 10/07/2021.

CLINICAL DATA: Chest pain.  Evaluate for pneumothorax.

EXAM:
PORTABLE CHEST 1 VIEW

[dg chest port 1 view]
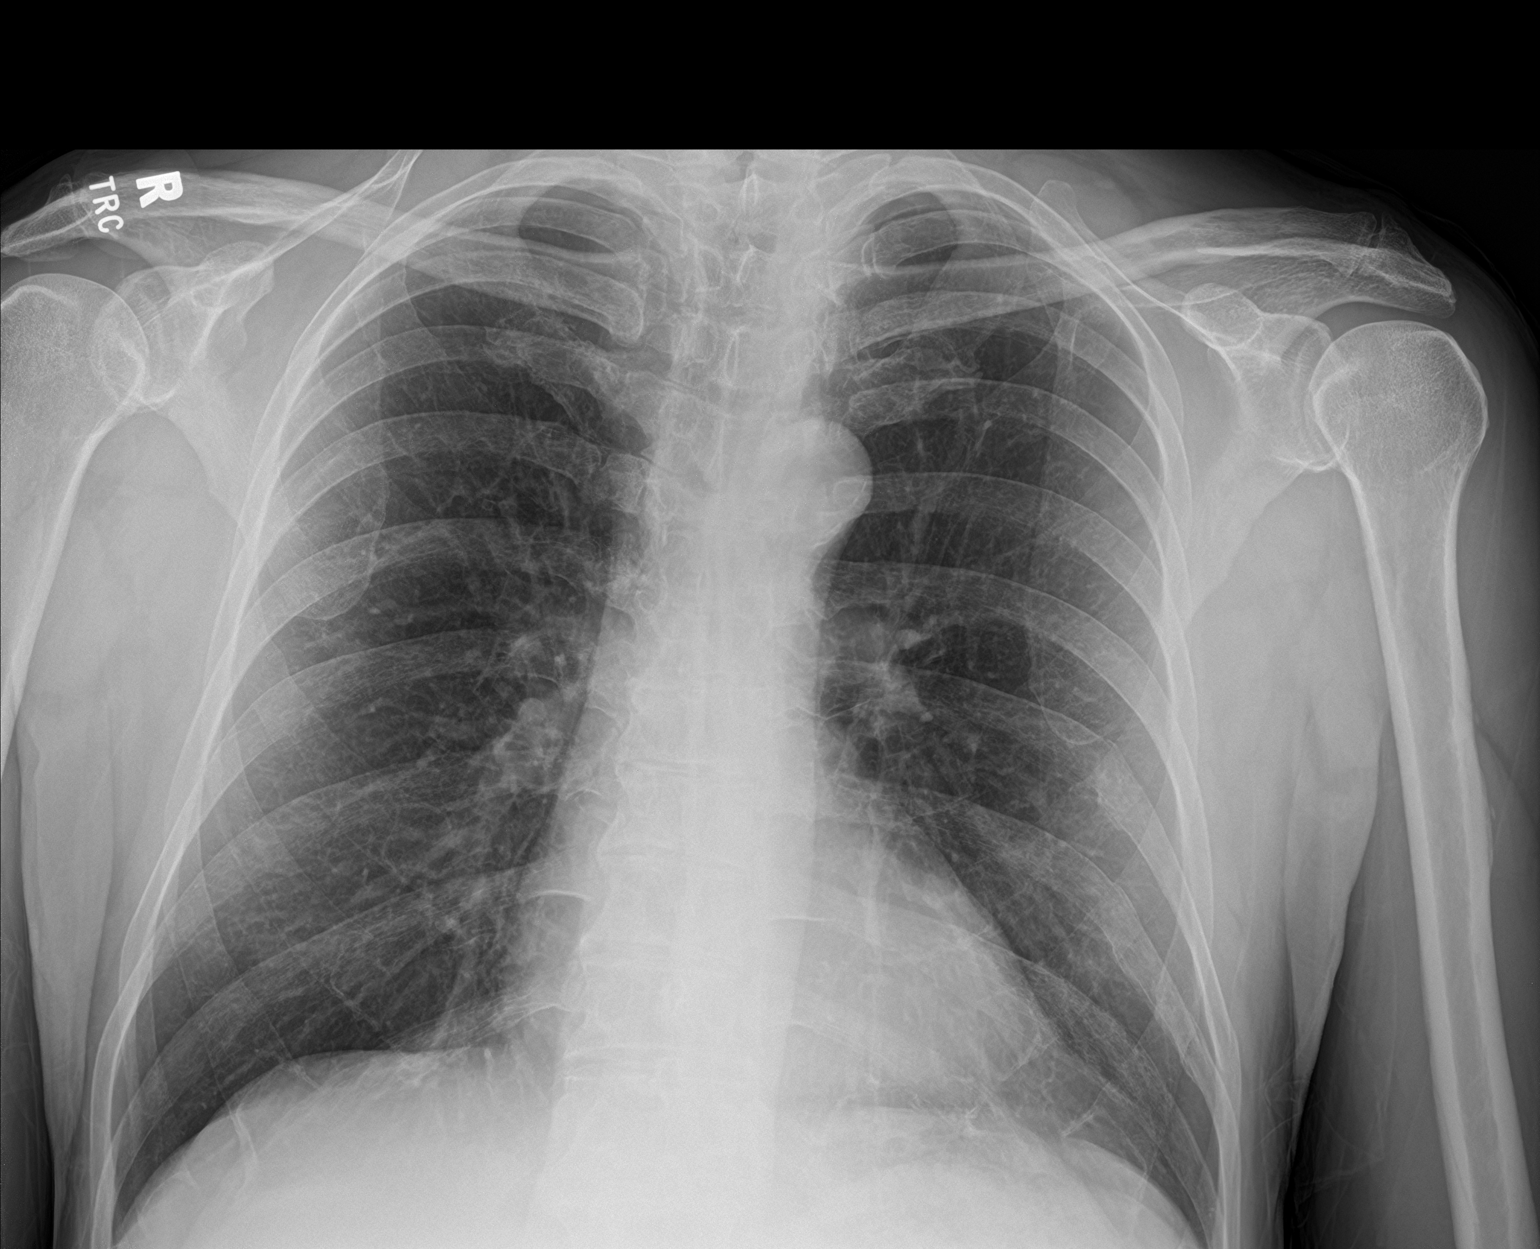

[1 of 1 positions shown; findings below may reference images not displayed]

FINDINGS: No focal consolidation, pleural effusion, pneumothorax. The cardiac
silhouette is within normal limits. Atherosclerotic calcification of
the aortic arch. No acute osseous pathology
IMPRESSION: No active disease.

## 2022-09-26 NOTE — Congregational Nurse Program (Signed)
  Dept: [phone]   Congregational Nurse Program Note  Date of Encounter: 09/26/2022 Client to [facility] day center to pick up his Christmas gift as he was missed the Center party on Dec. 20th. He was also in need of some assistance with personal care and the use of a telephone to contact his brother. Support given.  Past Medical History: Past Medical History:  Diagnosis Date   Hep C w/o coma, chronic ([place])    Hypertension    Polysubstance abuse ([place])     Encounter Details:  CNP Questionnaire - 09/26/22 1125       Questionnaire   Ask client: Do you give verbal consent for me to treat you today? Yes    Student Assistance N/A    Location Patient [place]    Visit Setting with Client Organization    Patient Status Unhoused    Insurance [facility]    Insurance/Financial Assistance Referral N/A    Medication N/A    Medical Provider Yes   client was seen in July, has not rescheduled follow up that was due in October   Screening Referrals Made N/A    Medical Referrals Made N/A    Medical Appointment Made N/A    Recently w/o PCP, now 1st time PCP visit completed due to CNs referral or appointment made [place] Insecurities   Has food stamps which have been decreased   Transportation N/A   walks or has the [employer] [place]   Housing/Utilities No permanent housing   currenlty staying with friends, was sleeping on a local loading dock at [place].   Interpersonal Safety N/A    Interventions Advocate/Support    Abnormal to Normal Screening Since Last CN Visit N/A    Screenings CN Performed N/A    Sent Client to Lab for: N/A    Did client attend any of the following based off CNs referral or appointments made? N/A   client has been getting lunch at a small church on [address] he is aware of the food at the [place]   ED Visit Averted N/A    Life-Saving Intervention Made N/A

## 2022-10-01 IMAGING — CR DG CHEST 2V
2 series · 2 of 2 positions shown · non-contrast
Comparison: 10/14/2021

CLINICAL DATA: Chest pain

EXAM:
CHEST - 2 VIEW

[chest pa]
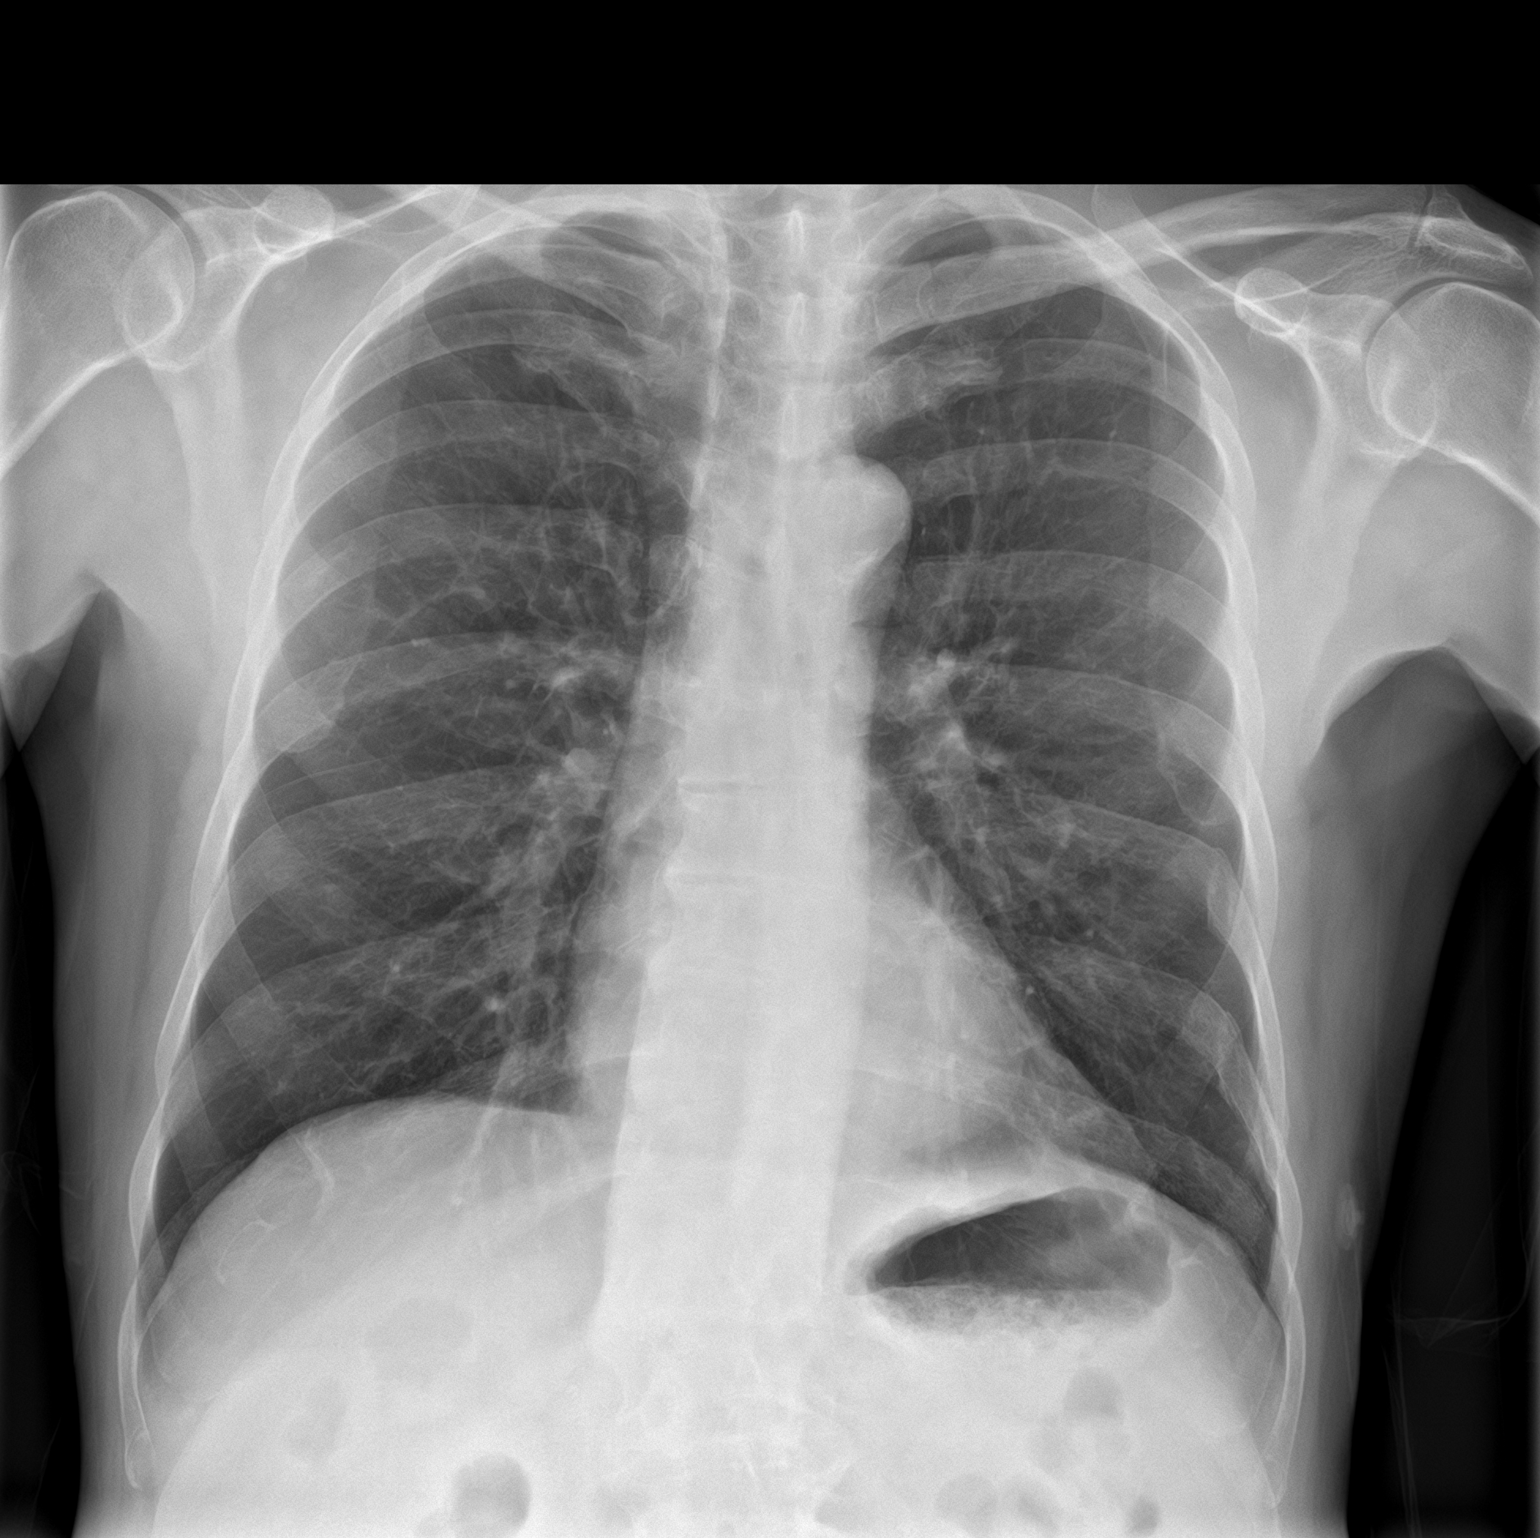

[chest lat]
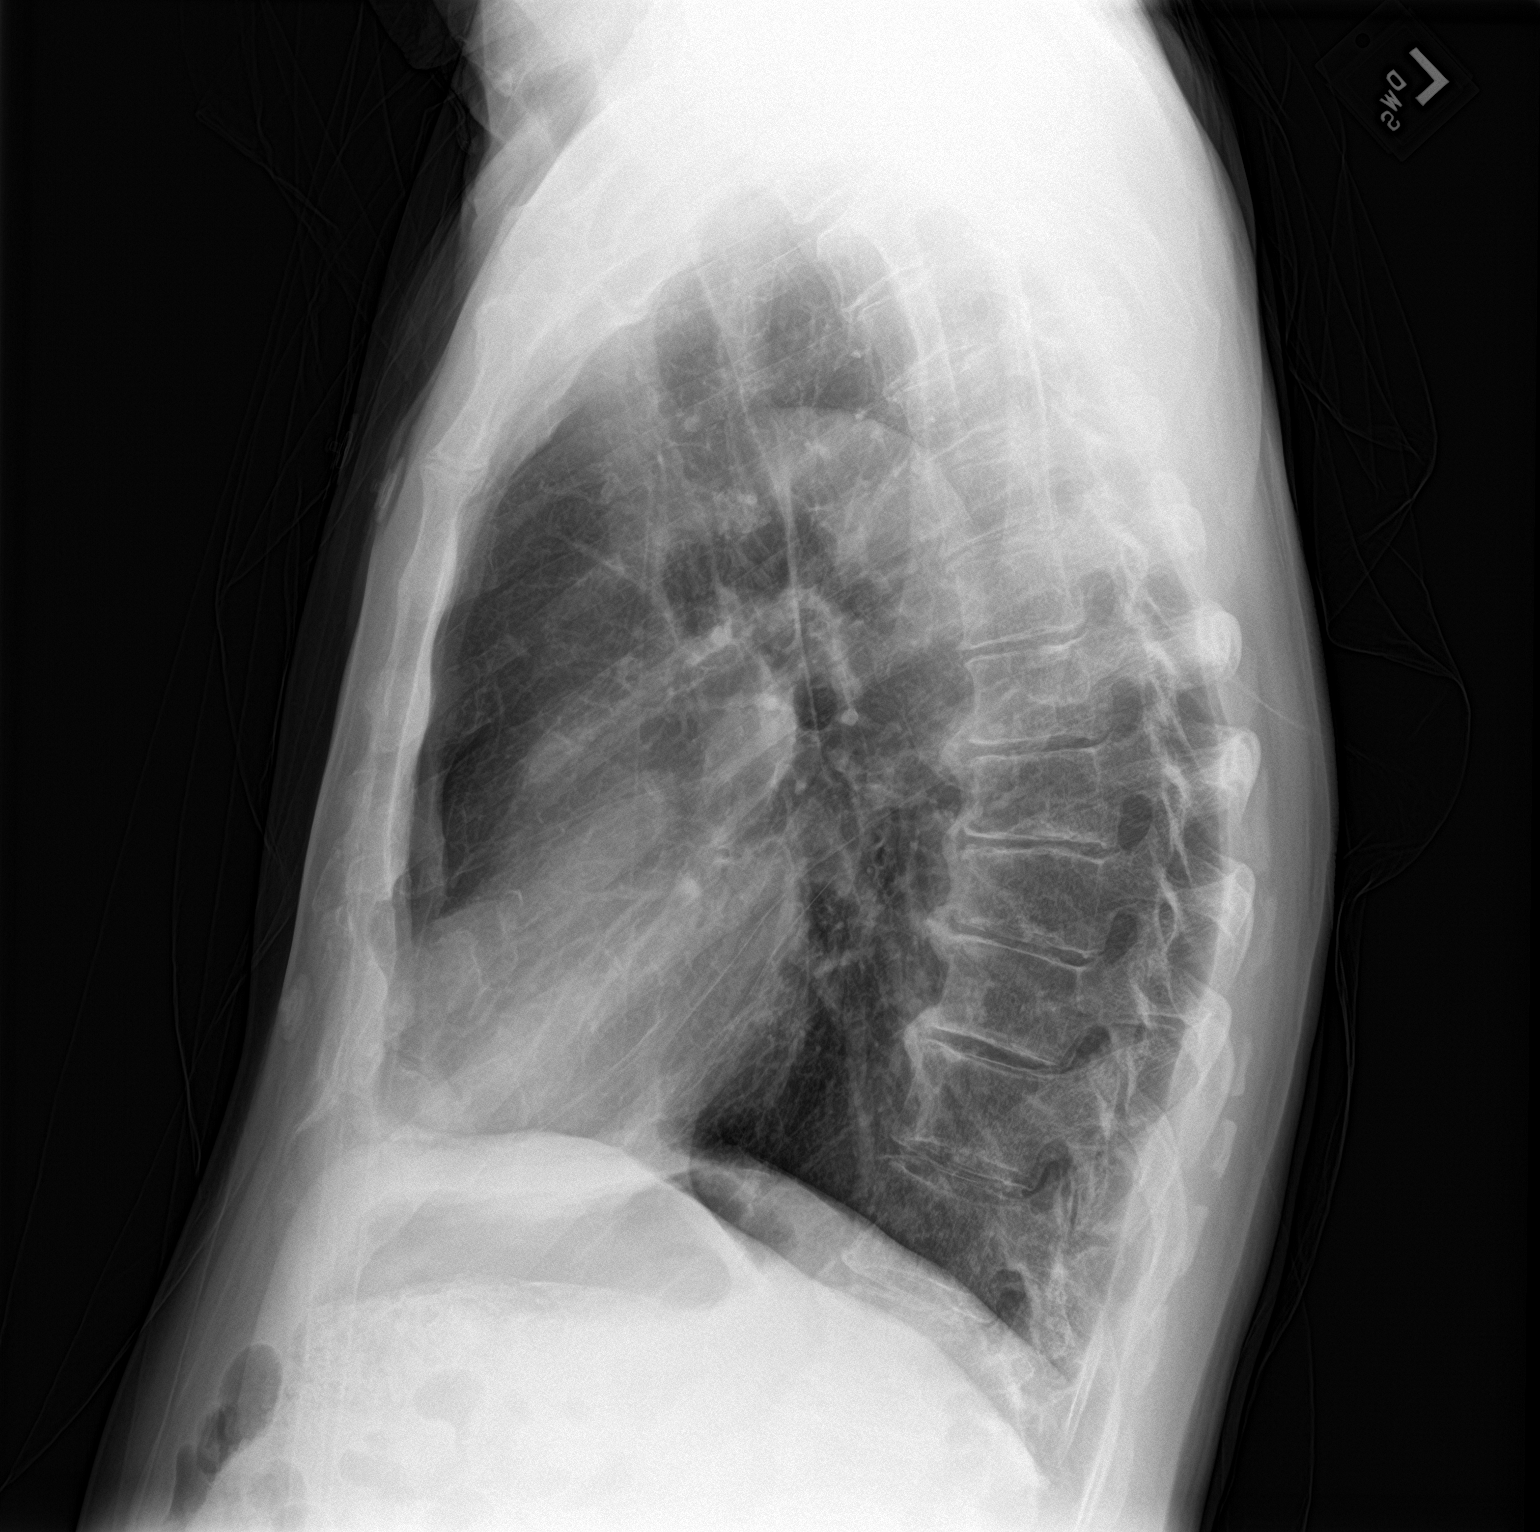

[2 of 2 positions shown; findings below may reference images not displayed]

FINDINGS: Heart and mediastinal contours are within normal limits. No focal
opacities or effusions. No acute bony abnormality.
IMPRESSION: No active cardiopulmonary disease.

## 2022-10-04 NOTE — Congregational Nurse Program (Signed)
  Dept: [phone]   Congregational Nurse Program Note  Date of Encounter: 10/04/2022 Client to [facility] day center with request for foot care. He slept outdoors last night. Toe nails trimmed, clean socks and foot warmers given. Client called his brother, but was unable to reach him. He is hoping his brother will let him stay there for a few days. RN assisted client with cancelling his food stamp card as it is lost. He also reports he has lost his social security card. RN to assist with this replacement at his next visit. Support given. Past Medical History: Past Medical History:  Diagnosis Date   Hep C w/o coma, chronic ([place])    Hypertension    Polysubstance abuse ([place])     Encounter Details:  CNP Questionnaire - 10/04/22 1033       Questionnaire   Ask client: Do you give verbal consent for me to treat you today? Yes    Student Assistance N/A    Location Patient [place]    Visit Setting with Client Organization    Patient Status Unhoused    Insurance [facility]    Insurance/Financial Assistance Referral N/A    Medication N/A    Medical Provider Yes   client was seen in July, has not rescheduled follow up that was due in October   Screening Referrals Made N/A    Medical Referrals Made N/A    Medical Appointment Made N/A    Recently w/o PCP, now 1st time PCP visit completed due to CNs referral or appointment made [place] Insecurities   Has food stamps which have been decreased   Transportation N/A   walks [place]   Housing/Utilities No permanent housing   no longer staying with friends, he slept outside last night   Interpersonal Safety N/A    Interventions Advocate/Support;Educate    Abnormal to Normal Screening Since Last CN Visit N/A    Screenings CN Performed N/A    Sent Client to Lab for: N/A    Did client attend any of the following based off CNs referral or appointments made? N/A   client has been getting lunch at a  small church on [address] he is aware of the food at the [place]   ED Visit Averted N/A    Life-Saving Intervention Made N/A

## 2022-10-10 IMAGING — CT CT CERVICAL SPINE W/O CM
3 of 4 series · 9 of 34 positions shown, 11 images · non-contrast
Comparison: 10/06/2021

CLINICAL DATA: Recent syncopal episode



[Series 6: sagittal bone · sagittal · 0.30mm/px · 5 of 57 slices shown, 6 images]
[im 19/57  bone]
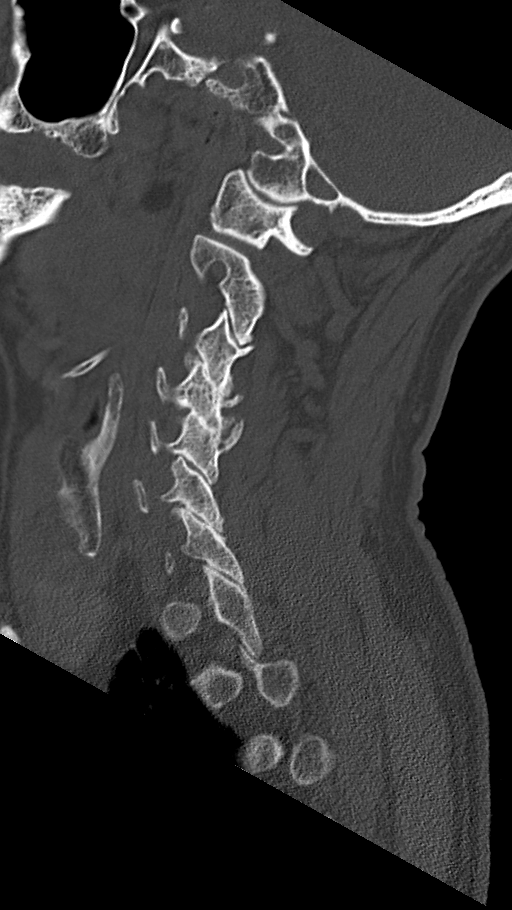
[im 24/57  bone]
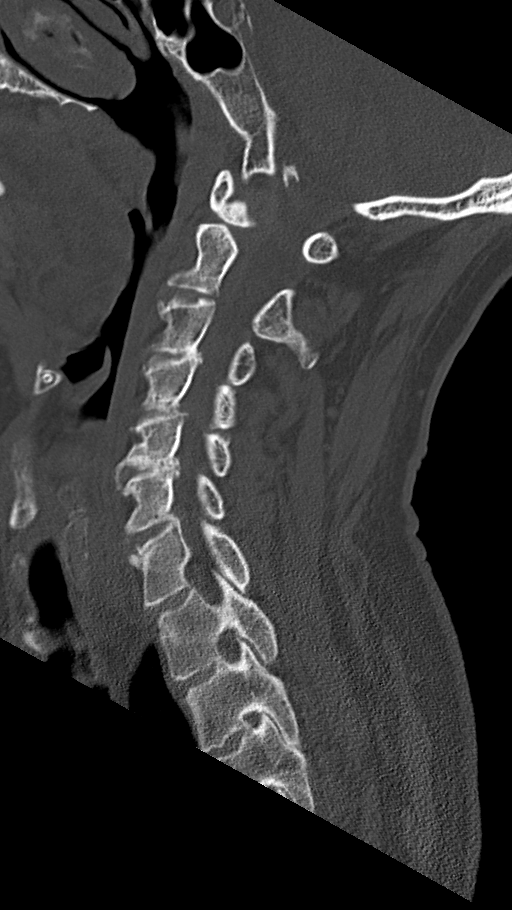
[im 29/57  soft-tissue]
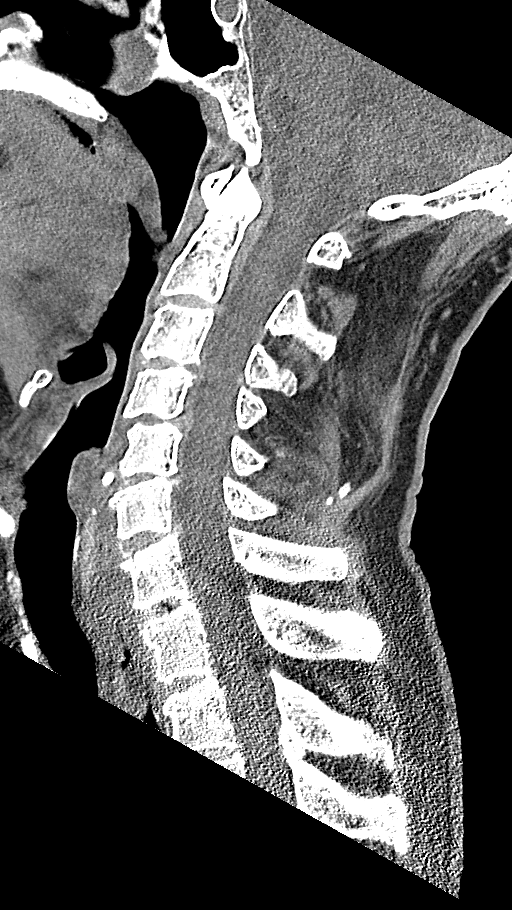
[im 29/57  bone]
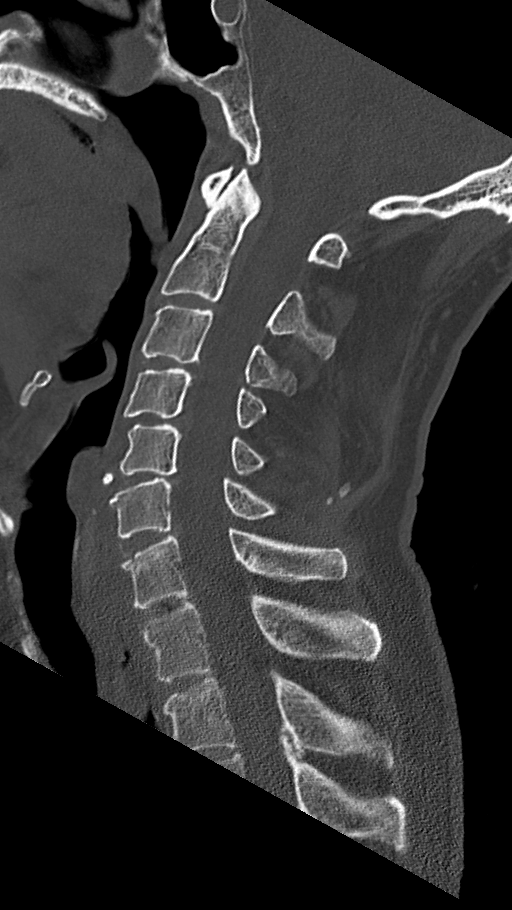
[im 33/57  bone]
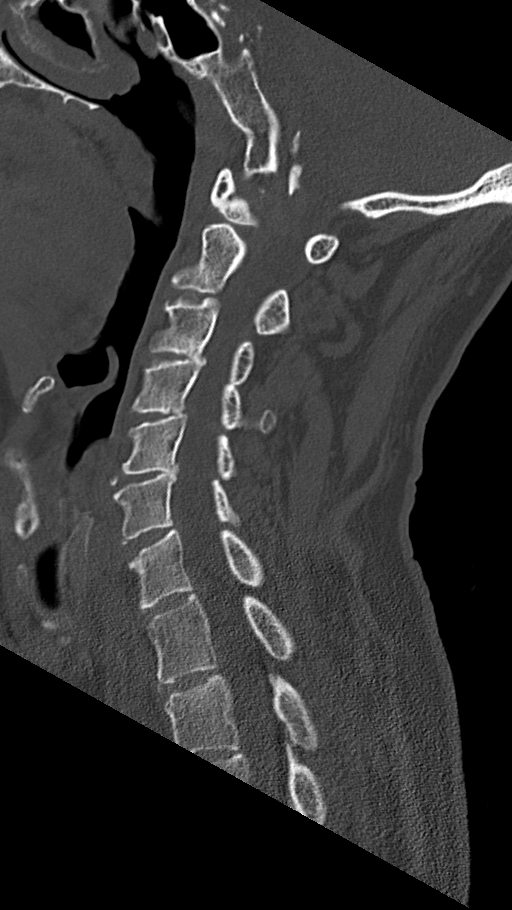
[im 38/57  bone]
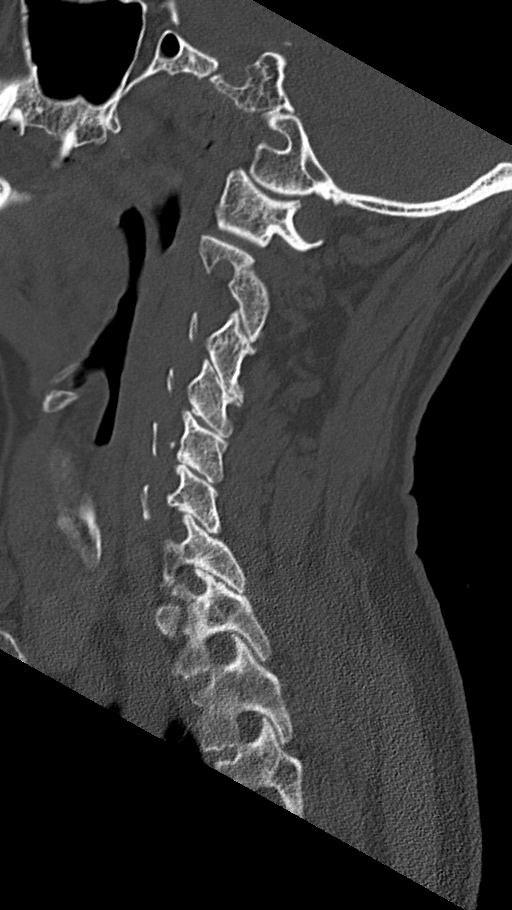

[Series 7: coronal bone · coronal · 0.27mm/px · 3 of 77 slices shown]
[im 24/77  bone]
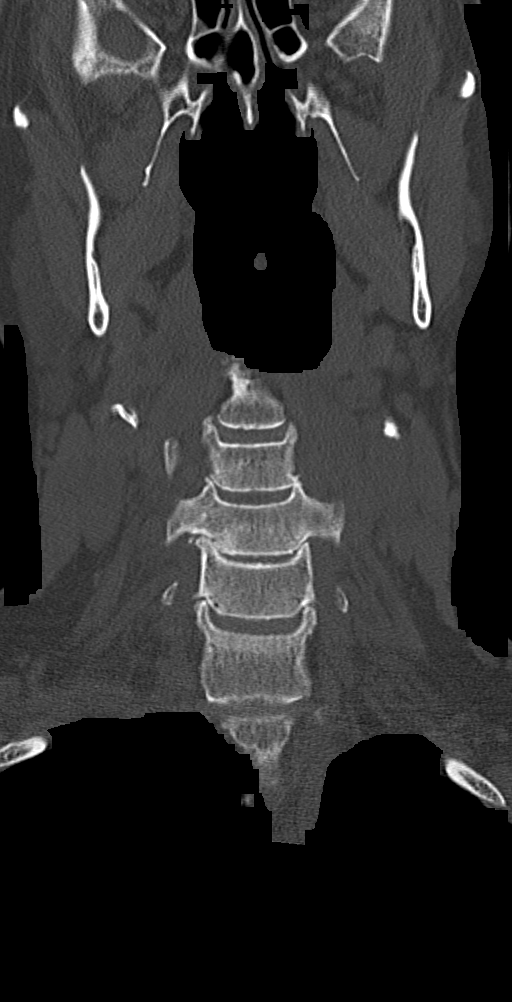
[im 34/77  bone]
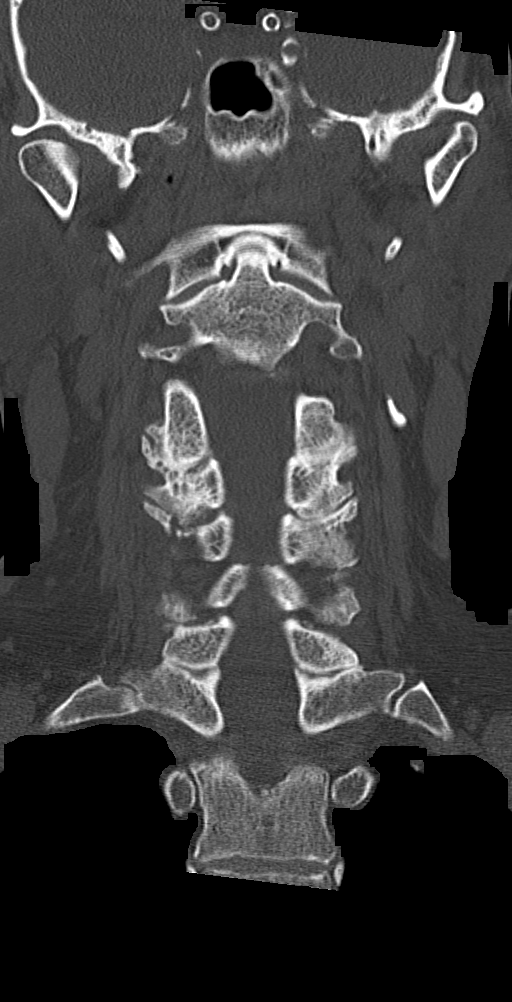
[im 44/77  bone]
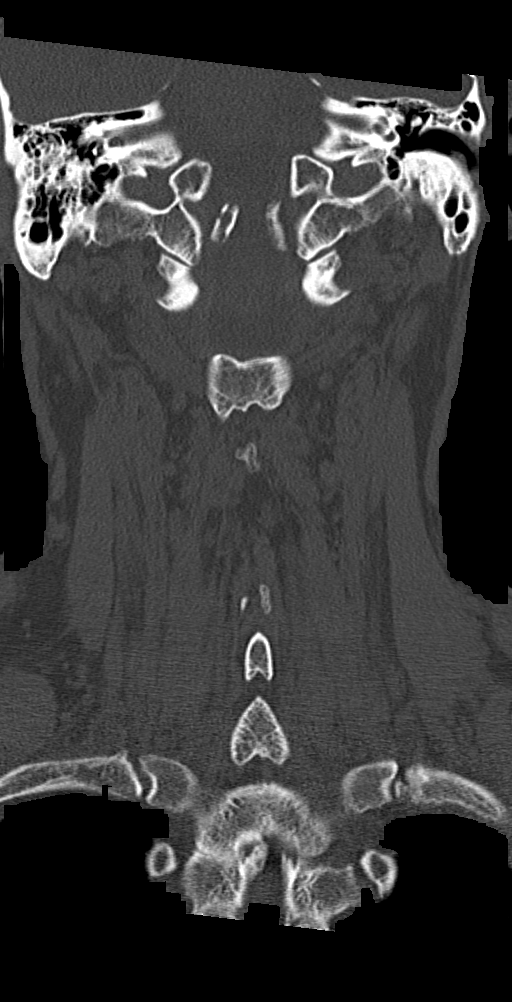

[Series 8: orthogonal bone · axial · 0.22mm/px · z∈[-258,-258]mm · 1 of 137 slices shown, 2 images]
[im 78/137  soft-tissue]
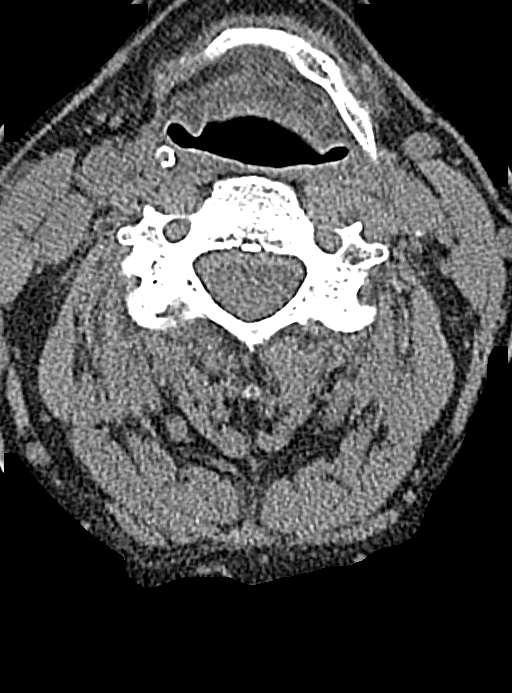
[im 78/137  bone]
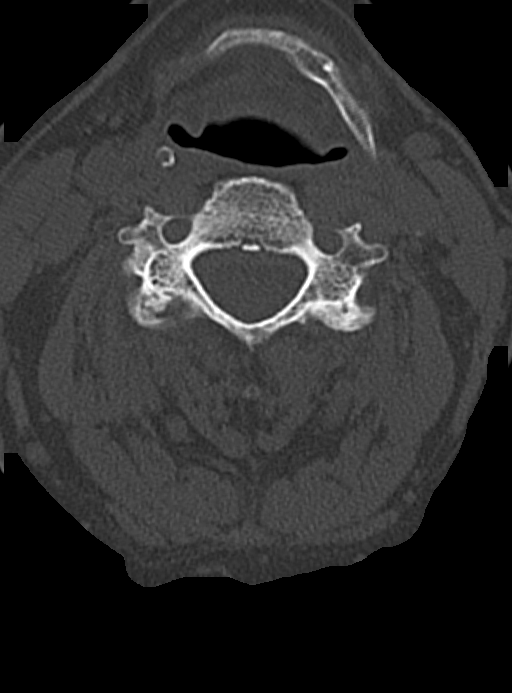

[9 of 34 positions shown; findings below may reference images not displayed]

FINDINGS: CT HEAD FINDINGS

Brain: No evidence of acute infarction, hemorrhage, hydrocephalus,
extra-axial collection or mass lesion/mass effect. Mild atrophic
changes and chronic white matter ischemic changes noted.

Vascular: No hyperdense vessel or unexpected calcification.

Skull: Normal. Negative for fracture or focal lesion.

Sinuses/Orbits: No acute finding.

Other: None.

CT CERVICAL SPINE FINDINGS

Alignment: Within normal limits.

Skull base and vertebrae: 7 cervical segments are well visualized.
Vertebral body height is well maintained. Multilevel osteophytic
changes and facet hypertrophic changes are seen. No acute fracture
or acute facet abnormality is noted.

Soft tissues and spinal canal: Surrounding soft tissue structures
are within normal limits.

Upper chest: Visualized lung apices are unremarkable.

Other: None
IMPRESSION: CT of the head: Chronic atrophic and ischemic changes without acute
abnormality.

CT of the cervical spine: Multilevel degenerative changes without
acute abnormality.

## 2022-10-10 IMAGING — CT CT HEAD W/O CM
4 series · 16 of 47 positions shown, 18 images · non-contrast
Comparison: 10/06/2021

CLINICAL DATA: Recent syncopal episode



[Series 2: head wo · axial · 0.47mm/px · z∈[-125,-5]mm · 7 of 34 slices shown, 9 images]
[im 5/34  brain]
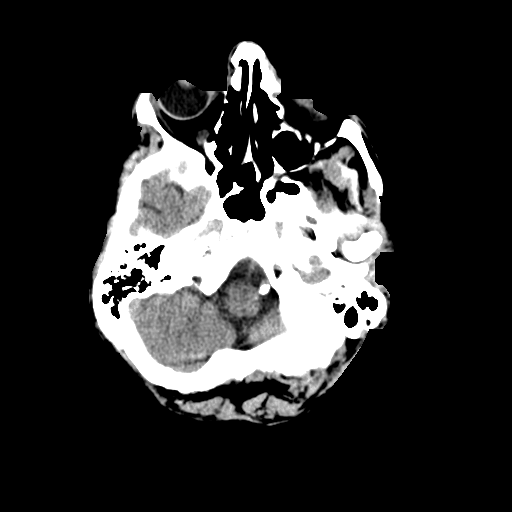
[im 5/34  bone]
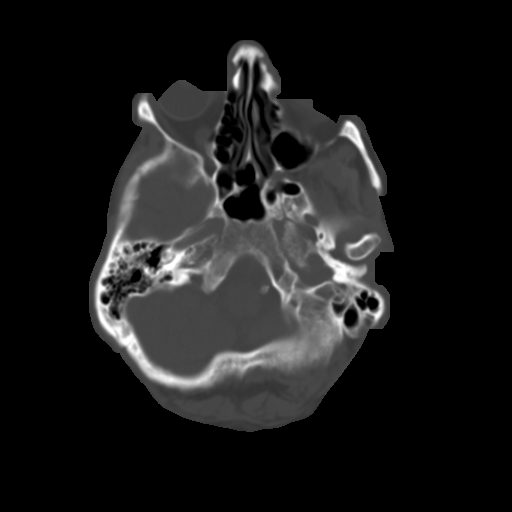
[im 9/34  brain]
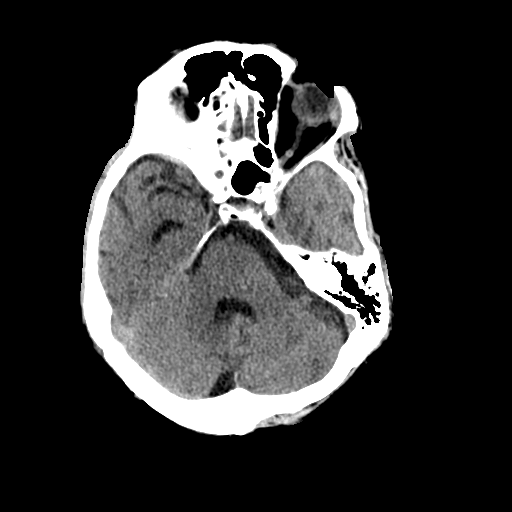
[im 13/34  brain]
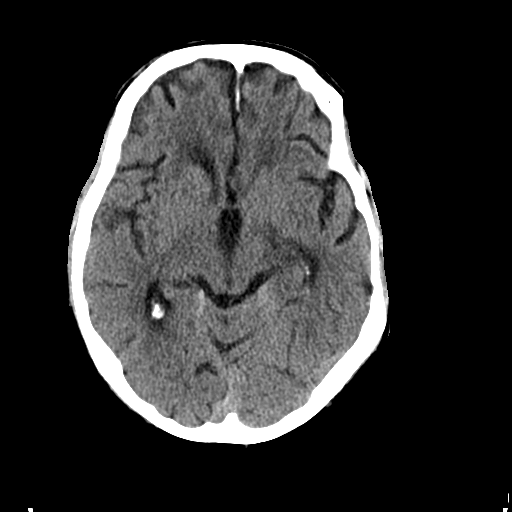
[im 17/34  brain]
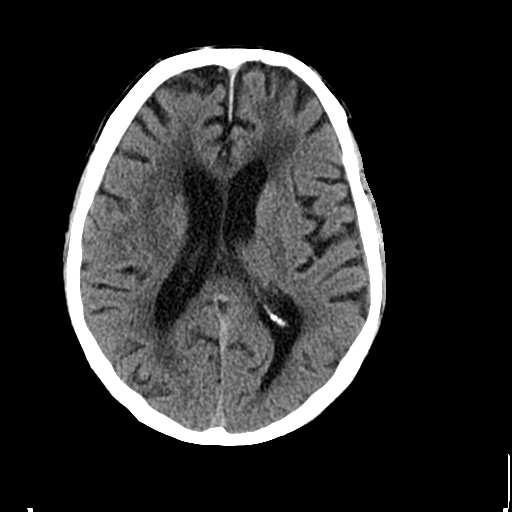
[im 21/34  brain]
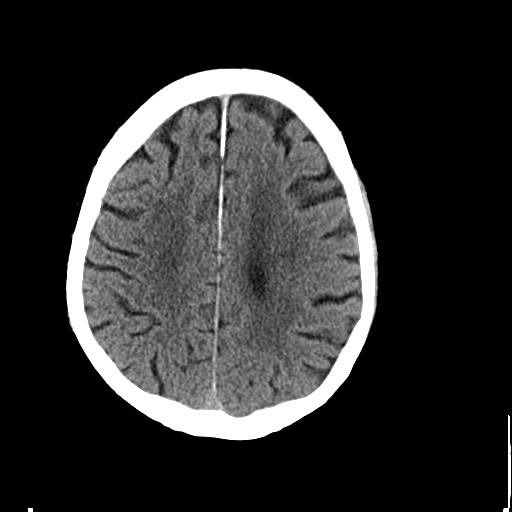
[im 21/34  bone]
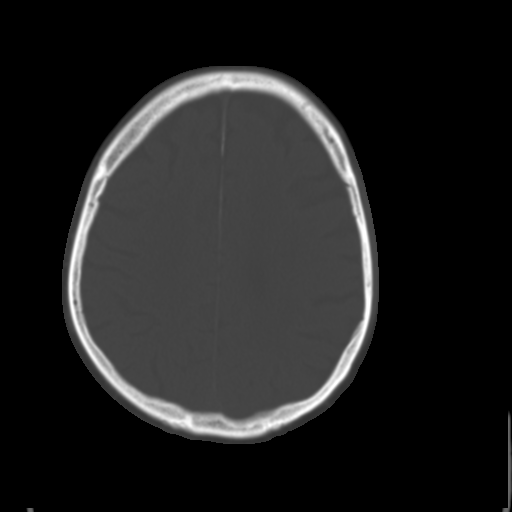
[im 25/34  brain]
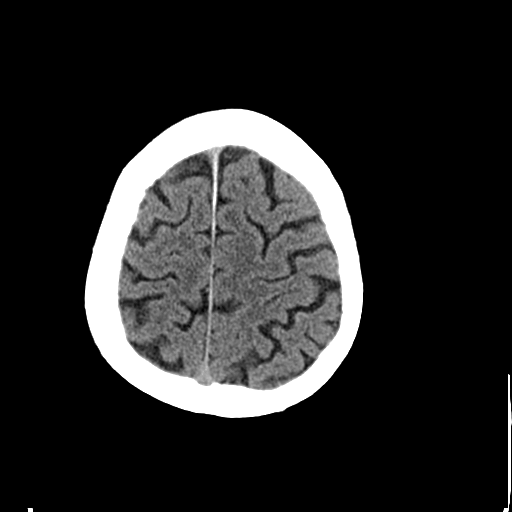
[im 29/34  brain]
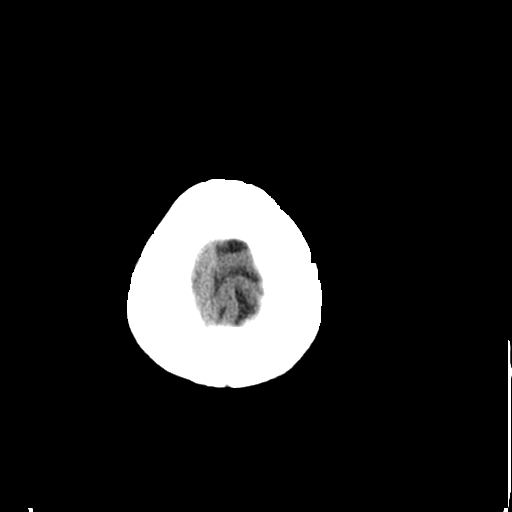

[Series 3: head bone · axial · 0.47mm/px · z∈[-129,-95]mm · 3 of 85 slices shown]
[im 9/85  bone]
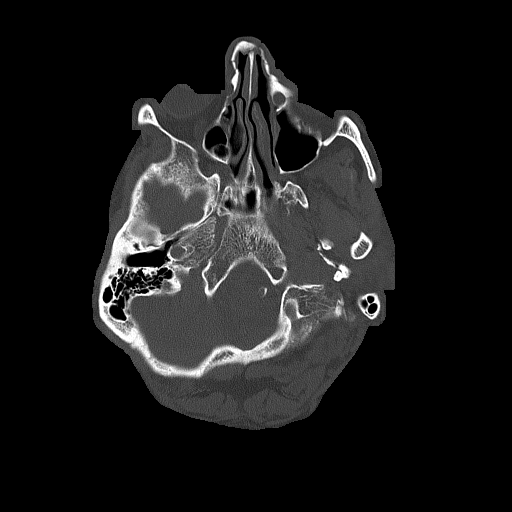
[im 17/85  bone]
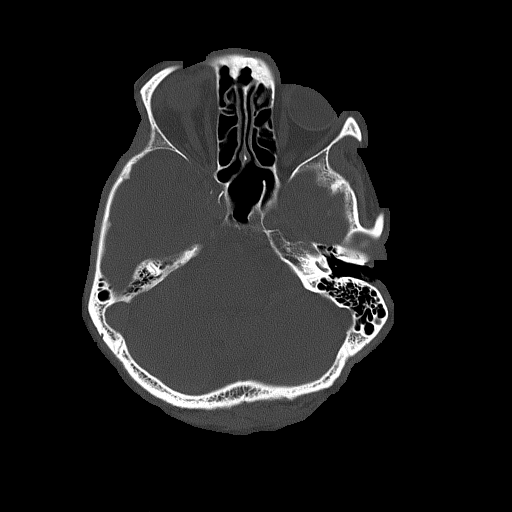
[im 26/85  bone]
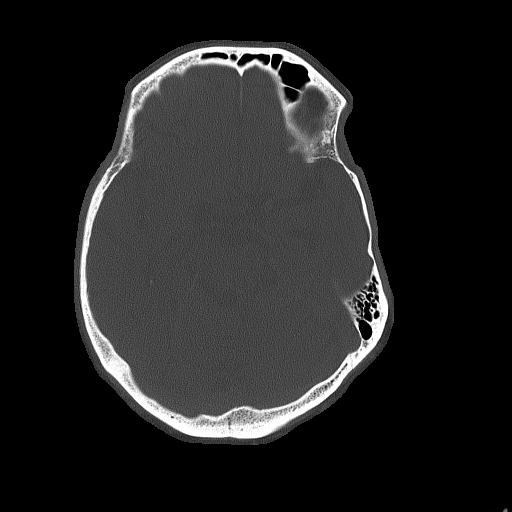

[Series 4: coronal soft tissue · coronal · 0.35mm/px · 3 of 68 slices shown]
[im 23/68  brain]
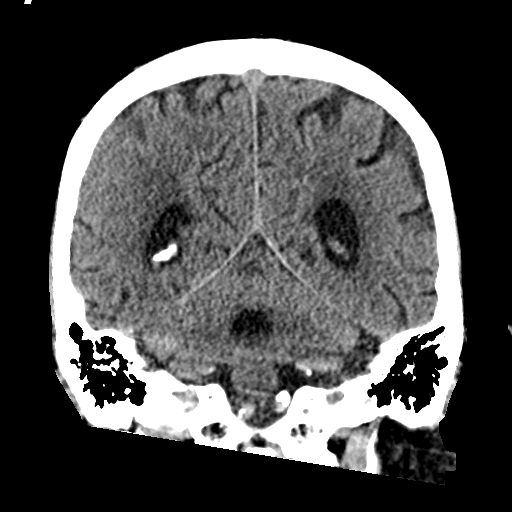
[im 30/68  brain]
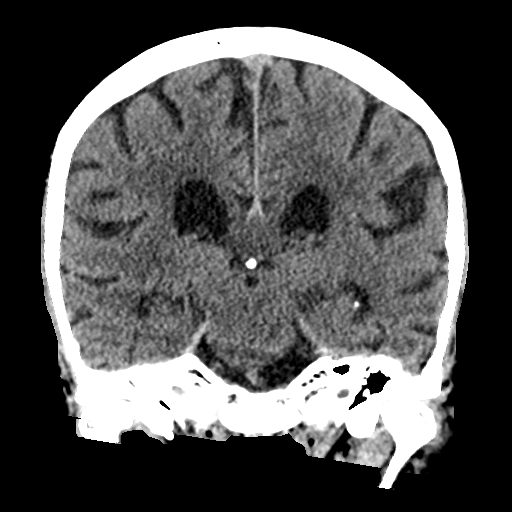
[im 38/68  brain]
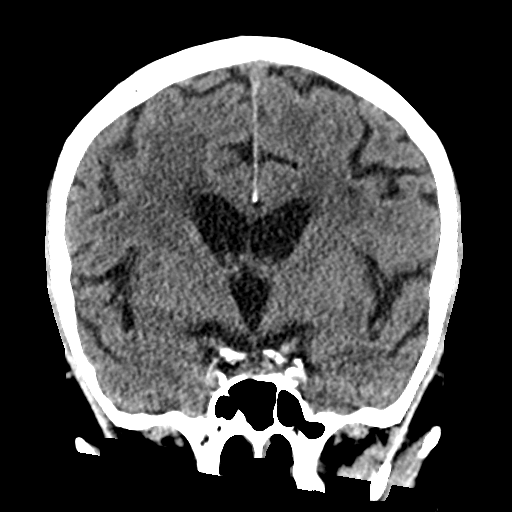

[Series 5: sagittal soft tissue · sagittal · 0.35mm/px · 3 of 57 slices shown]
[im 22/57  brain]
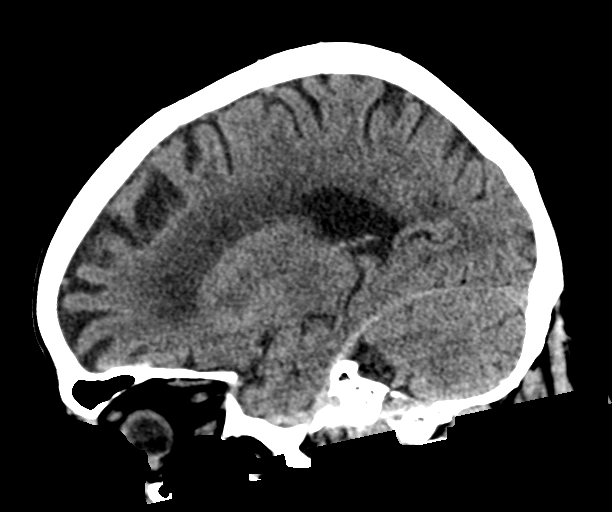
[im 29/57  brain]
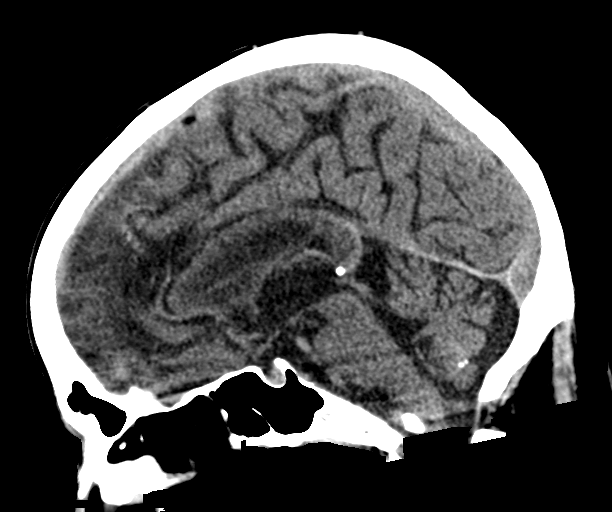
[im 36/57  brain]
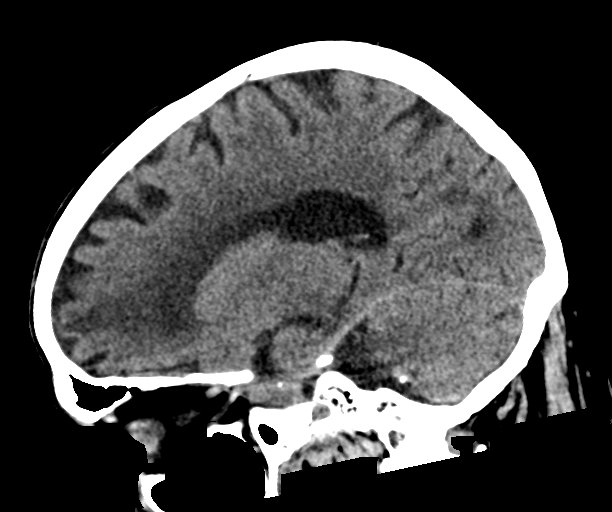

[16 of 47 positions shown; findings below may reference images not displayed]

FINDINGS: CT HEAD FINDINGS

Brain: No evidence of acute infarction, hemorrhage, hydrocephalus,
extra-axial collection or mass lesion/mass effect. Mild atrophic
changes and chronic white matter ischemic changes noted.

Vascular: No hyperdense vessel or unexpected calcification.

Skull: Normal. Negative for fracture or focal lesion.

Sinuses/Orbits: No acute finding.

Other: None.

CT CERVICAL SPINE FINDINGS

Alignment: Within normal limits.

Skull base and vertebrae: 7 cervical segments are well visualized.
Vertebral body height is well maintained. Multilevel osteophytic
changes and facet hypertrophic changes are seen. No acute fracture
or acute facet abnormality is noted.

Soft tissues and spinal canal: Surrounding soft tissue structures
are within normal limits.

Upper chest: Visualized lung apices are unremarkable.

Other: None
IMPRESSION: CT of the head: Chronic atrophic and ischemic changes without acute
abnormality.

CT of the cervical spine: Multilevel degenerative changes without
acute abnormality.

## 2022-10-10 IMAGING — CT CT ANGIO CHEST
2 of 7 series · 17 of 36 positions shown · IV contrast (APPLIED)
Comparison: CT from 10/06/2021

CLINICAL DATA: Recent syncopal episode

EXAM:
CT ANGIOGRAPHY CHEST WITH CONTRAST
TECHNIQUE: Multidetector CT imaging of the chest was performed using the
standard protocol during bolus administration of intravenous
contrast. Multiplanar CT image reconstructions and MIPs were
obtained to evaluate the vascular anatomy.

[Series 6: thins · axial · 0.71mm/px · z∈[-545,-281]mm · 16 of 373 slices shown]
[im 22/373  lung]
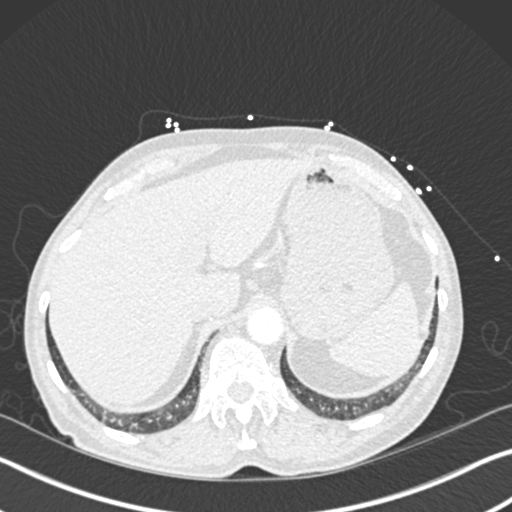
[im 44/373  mediastinal]
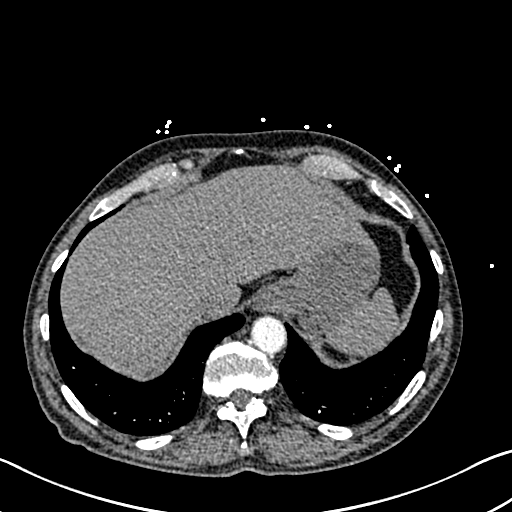
[im 66/373  lung]
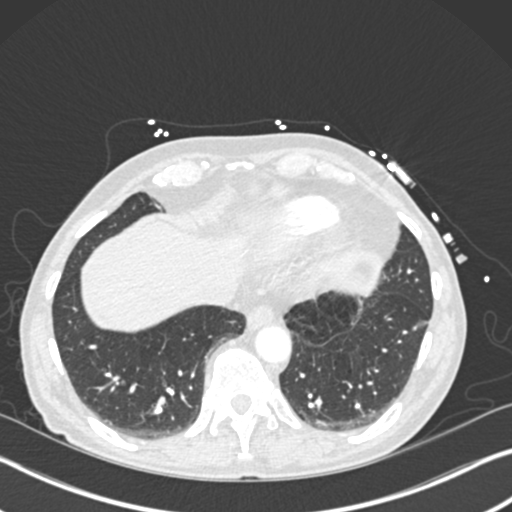
[im 88/373  mediastinal]
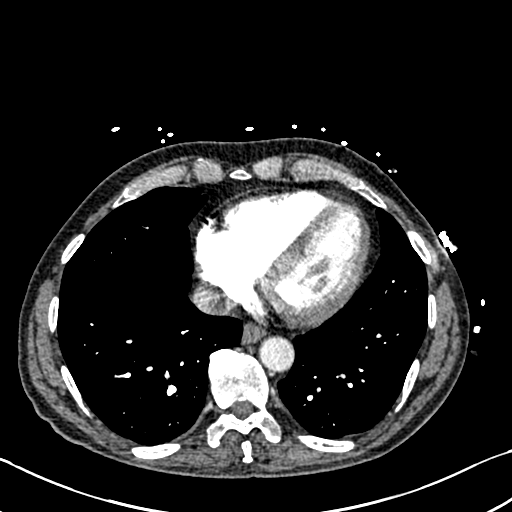
[im 110/373  lung]
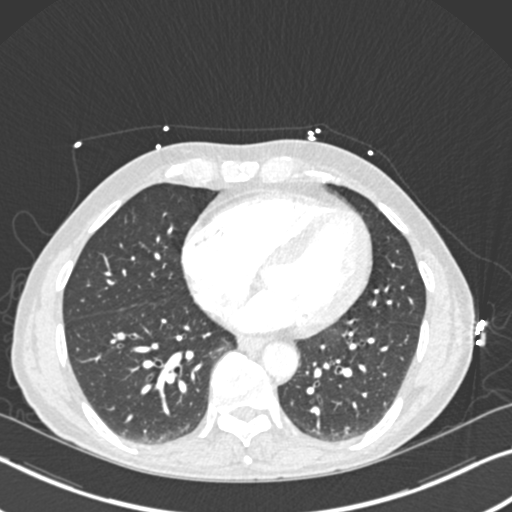
[im 132/373  mediastinal]
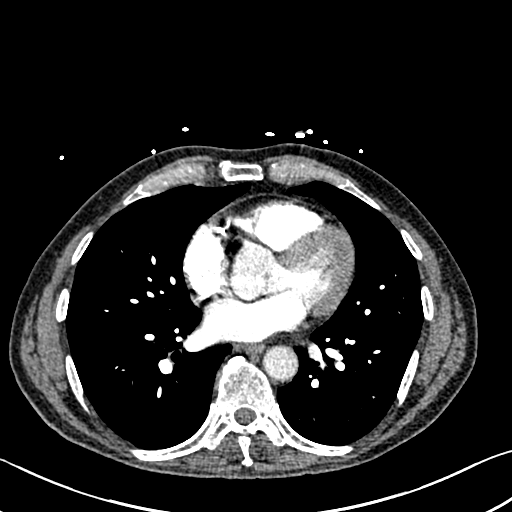
[im 154/373  lung]
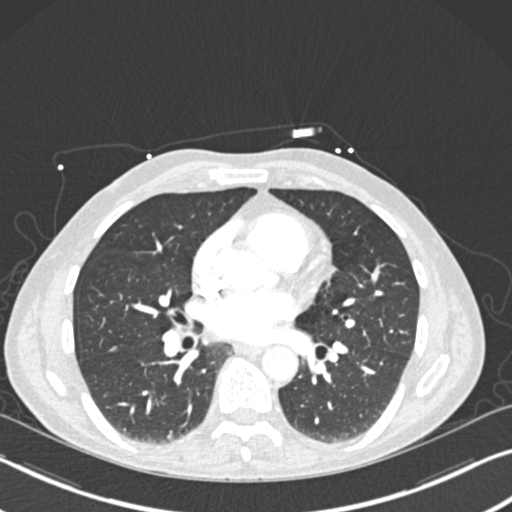
[im 176/373  mediastinal]
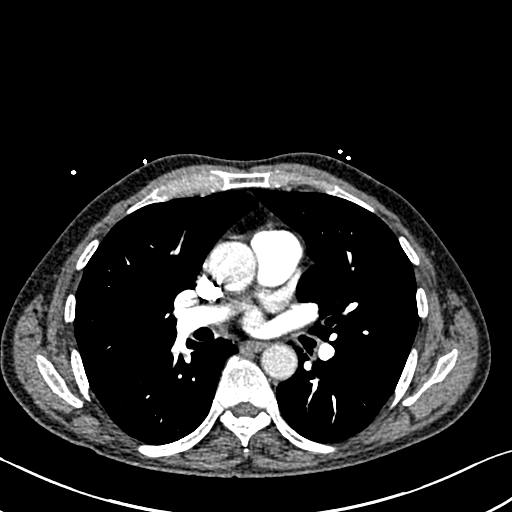
[im 197/373  lung]
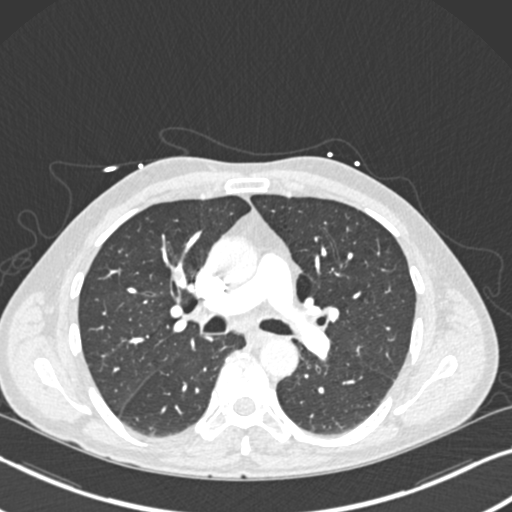
[im 219/373  mediastinal]
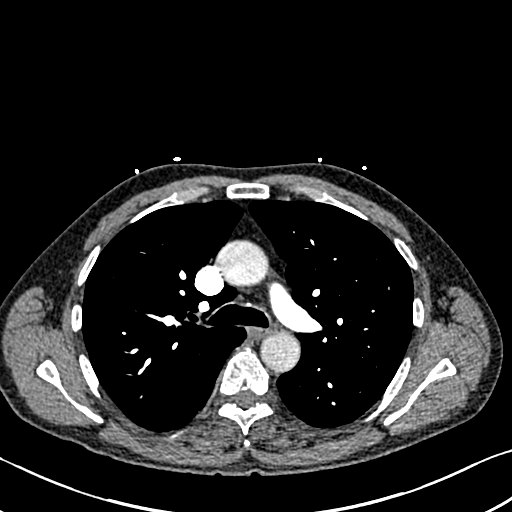
[im 241/373  lung]
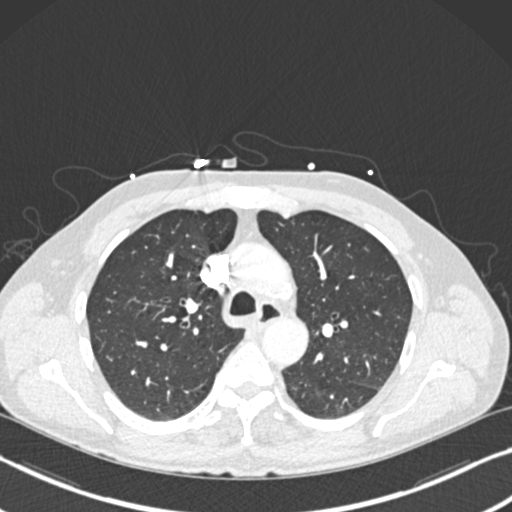
[im 263/373  mediastinal]
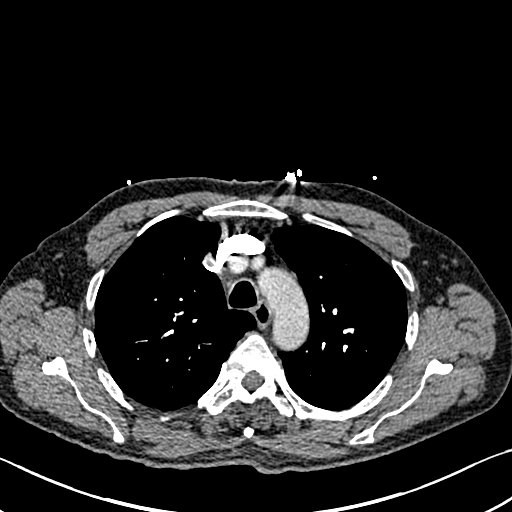
[im 285/373  lung]
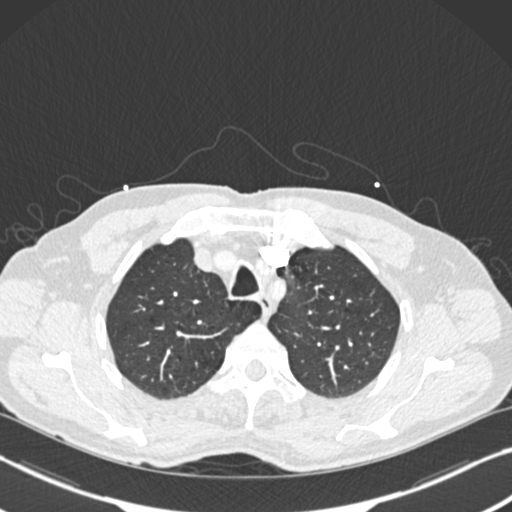
[im 307/373  mediastinal]
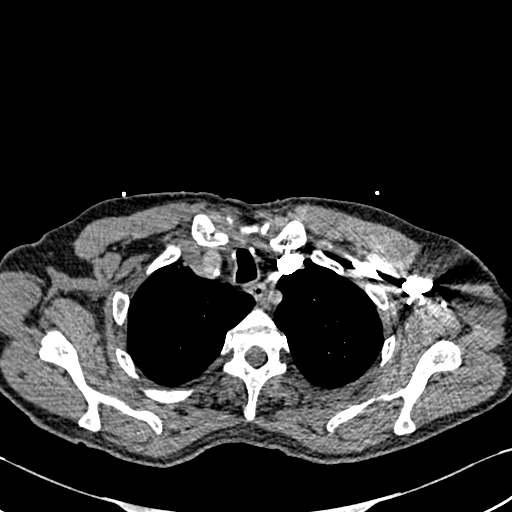
[im 329/373  lung]
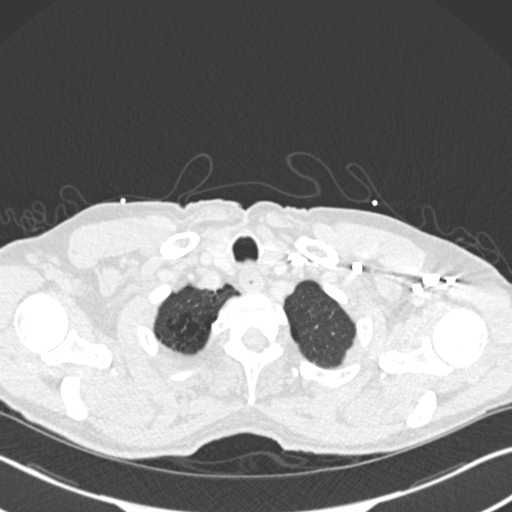
[im 351/373  mediastinal]
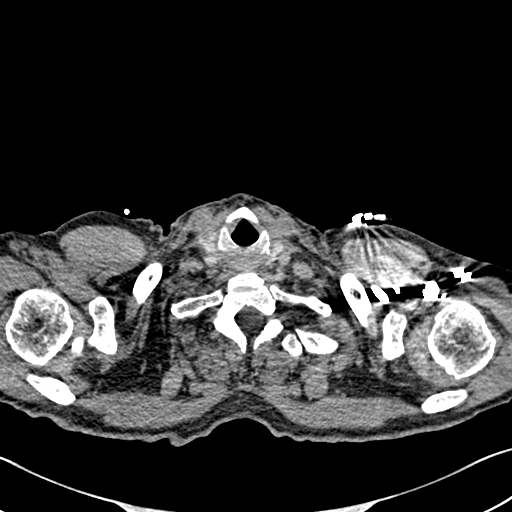

[Series 8: coronal mpr · coronal · 0.60mm/px · 1 of 87 slices shown]
[im 44/87  mediastinal]
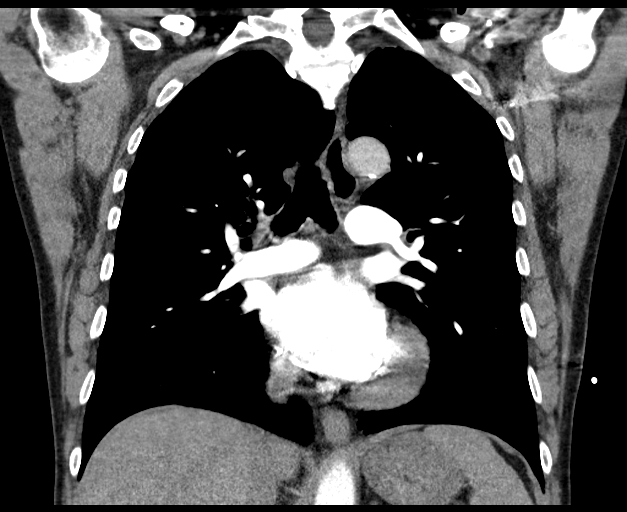

[17 of 36 positions shown; findings below may reference images not displayed]

RADIATION DOSE REDUCTION: This exam was performed according to the
departmental dose-optimization program which includes automated
exposure control, adjustment of the mA and/or kV according to
patient size and/or use of iterative reconstruction technique.

CONTRAST:  100mL OMNIPAQUE IOHEXOL 350 MG/ML SOLN
FINDINGS: Cardiovascular: Thoracic aorta demonstrates atherosclerotic
calcifications without aneurysmal dilatation or dissection. No
cardiac enlargement is seen. Coronary calcifications are noted. The
pulmonary artery shows bilateral filling defects consistent with
pulmonary emboli primarily within the lower lobe branches. Some
right upper lobe pulmonary emboli are seen as well. Mild right heart
strain is noted within RV/LV ratio of 1. Some of the previously seen
emboli have resolved although some new emboli are noted.

Mediastinum/Nodes: Thoracic inlet is within normal limits. No
sizable hilar or mediastinal adenopathy is noted. The esophagus as
visualized is within normal limits.

Lungs/Pleura: Lungs demonstrates some mild dependent atelectatic
changes. No focal infiltrate or sizable effusion is seen. No
parenchymal nodules are noted.

Upper Abdomen: Visualized upper abdomen shows no acute abnormality.

Musculoskeletal: Degenerative changes of the thoracic spine are
noted. No rib abnormality is noted.

Review of the MIP images confirms the above findings.
IMPRESSION: Changes consistent with bilateral pulmonary emboli with mild right
heart strain. The degree of pulmonary emboli has overall worsened
slightly in the interval from the prior exam.

No other focal abnormality is noted.

Aortic Atherosclerosis ([id number]).

Critical Value/emergent results were called by telephone at the time
of interpretation on 10/24/2021 at [DATE] to Dr. KAI SHING TUTY , who
verbally acknowledged these results.

## 2022-10-11 NOTE — Congregational Nurse Program (Signed)
  Dept: [phone]   Congregational Nurse Program Note  Date of Encounter: 10/11/2022 Client to [facility] with request for assistance with his mail. He received a letter from [employer] advising him of a COLA raise that may effect his food stamps. RN assisted client with calling to inquire about the change. He will receive a few dollars more in January, but will have to reapply for the Food stamps in February. Client is still awaiting his new food stamp card that RN assisted him with replacing last week. Per phone conversation today, the card has been mailed and should arrive on 1/23. Client appreciative of assistance provided. Past Medical History: Past Medical History:  Diagnosis Date   Hep C w/o coma, chronic ([place])    Hypertension    Polysubstance abuse ([place])     Encounter Details:  CNP Questionnaire - 10/11/22 1055       Questionnaire   Ask client: Do you give verbal consent for me to treat you today? Yes    Student Assistance N/A    Location Patient [place]    Visit Setting with Client Organization    Patient Status Unhoused    Insurance [facility]    Insurance/Financial Assistance Referral N/A    Medication N/A    Medical Provider Yes   client was seen in July, has not rescheduled follow up that was due in October   Screening Referrals Made N/A    Medical Referrals Made N/A    Medical Appointment Made N/A    Recently w/o PCP, now 1st time PCP visit completed due to CNs referral or appointment made [place] [facility]   Has food stamps which have been decreased   Transportation N/A   walks or [place] the [place] bus [place]   Housing/Utilities No permanent housing   no longer staying with friends, he slept outside last night   Interpersonal Safety N/A    Interventions Advocate/Support    Abnormal to Normal Screening Since Last CN Visit N/A    Screenings CN Performed N/A    Sent Client to Lab for: N/A    Did client attend any of  the following based off CNs referral or appointments made? N/A   client has been getting lunch at a small church on [address] he is aware of the food at the [place]   ED Visit Averted N/A    Life-Saving Intervention Made N/A

## 2022-10-14 ENCOUNTER — Encounter: Payer: Self-pay | Admitting: Emergency Medicine

## 2022-10-14 ENCOUNTER — Emergency Department: Payer: Medicaid Other

## 2022-10-14 DIAGNOSIS — S0081XA Abrasion of other part of head, initial encounter: Secondary | ICD-10-CM | POA: Insufficient documentation

## 2022-10-14 DIAGNOSIS — Z23 Encounter for immunization: Secondary | ICD-10-CM | POA: Diagnosis not present

## 2022-10-14 DIAGNOSIS — M542 Cervicalgia: Secondary | ICD-10-CM | POA: Insufficient documentation

## 2022-10-14 DIAGNOSIS — R109 Unspecified abdominal pain: Secondary | ICD-10-CM | POA: Diagnosis not present

## 2022-10-14 DIAGNOSIS — S0990XA Unspecified injury of head, initial encounter: Secondary | ICD-10-CM | POA: Diagnosis present

## 2022-10-14 DIAGNOSIS — I10 Essential (primary) hypertension: Secondary | ICD-10-CM | POA: Insufficient documentation
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
## 2022-10-15 ENCOUNTER — Emergency Department
Admission: EM | Admit: 2022-10-15 | Discharge: 2022-10-15 | Disposition: A | Discharge status: Home / Self Care | Payer: Medicaid Other | Attending: Emergency Medicine | Admitting: Emergency Medicine

## 2022-10-15 DIAGNOSIS — S0990XA Unspecified injury of head, initial encounter: Secondary | ICD-10-CM

## 2022-10-15 MED ORDER — TETANUS-DIPHTH-ACELL PERTUSSIS 5-2.5-18.5 LF-MCG/0.5 IM SUSY
0.5000 mL | PREFILLED_SYRINGE | Freq: Once | INTRAMUSCULAR | Status: AC
  Administered 2022-10-15: 0.5 mL via INTRAMUSCULAR
  Filled 2022-10-15: qty 0.5

## 2022-10-15 MED ORDER — ACETAMINOPHEN 500 MG PO TABS
1000.0000 mg | ORAL_TABLET | Freq: Once | ORAL | Status: AC
  Administered 2022-10-15: 1000 mg via ORAL
  Filled 2022-10-15: qty 2

## 2022-10-15 NOTE — Discharge Instructions (Addendum)
Your CAT scan did not show any injury to your head or neck.  You can take Tylenol or Motrin for pain
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
## 2022-10-25 ENCOUNTER — Encounter: Payer: Self-pay | Admitting: Emergency Medicine

## 2022-10-25 DIAGNOSIS — Y909 Presence of alcohol in blood, level not specified: Secondary | ICD-10-CM | POA: Insufficient documentation

## 2022-10-25 DIAGNOSIS — S0001XA Abrasion of scalp, initial encounter: Secondary | ICD-10-CM | POA: Insufficient documentation

## 2022-10-25 DIAGNOSIS — F1410 Cocaine abuse, uncomplicated: Secondary | ICD-10-CM | POA: Insufficient documentation

## 2022-10-25 DIAGNOSIS — S0990XA Unspecified injury of head, initial encounter: Secondary | ICD-10-CM | POA: Diagnosis present

## 2022-10-25 DIAGNOSIS — F1010 Alcohol abuse, uncomplicated: Secondary | ICD-10-CM | POA: Insufficient documentation

## 2022-10-25 NOTE — ED Triage Notes (Signed)
Pt presents via [facility] after smoking cocaine and ETOH consumption. He states he just "wants to be checked out". States this isnt the first time he's done this - endorses homelessness. Denies CP or SOB.

## 2022-10-25 NOTE — Congregational Nurse Program (Signed)
  Dept: [phone]   Congregational Nurse Program Note  Date of Encounter: 10/25/2022 Client to [facility] day center with request for vital sign check and emotional support. He has been drinking more lately and has slept outside the last 2 nights. He reports he ate well at the lunch provided at the center today. BP (!) 148/76 (BP Location: Left Arm, Patient Position: Sitting, Cuff Size: Normal)   Pulse 72   SpO2 98% . Support given, no other needs at this time.  Past Medical History: Past Medical History:  Diagnosis Date   Hep C w/o coma, chronic ([place])    Hypertension    Polysubstance abuse ([place])     Encounter Details:  CNP Questionnaire - 10/25/22 1234       Questionnaire   Ask client: Do you give verbal consent for me to treat you today? Yes    Student Assistance N/A    Location Patient [place]    Visit Setting with Client Organization    Patient Status Unhoused    Insurance [facility]    Insurance/Financial Assistance Referral N/A    Medication N/A    Medical Provider Yes   client was seen in July, has not rescheduled follow up that was due in October   Screening Referrals Made N/A    Medical Referrals Made N/A    Medical Appointment Made N/A    Recently w/o PCP, now 1st time PCP visit completed due to CNs referral or appointment made [place] [facility]   Has food stamps which have been decreased   Transportation N/A   walks or [place] [employer] [place]   Housing/Utilities No permanent housing   no longer staying with friends, he slept outside last night   Interpersonal Safety N/A    Interventions Advocate/Support    Abnormal to Normal Screening Since Last CN Visit N/A    Screenings CN Performed Blood Pressure    Sent Client to Lab for: N/A    Did client attend any of the following based off CNs referral or appointments made? N/A   client has been getting lunch at a small church on [address] he is aware of the food at the [place]   ED Visit Averted N/A    Life-Saving Intervention Made N/A
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
## 2022-10-26 ENCOUNTER — Encounter: Payer: Self-pay | Admitting: Emergency Medicine

## 2022-10-26 ENCOUNTER — Emergency Department
Admission: EM | Admit: 2022-10-26 | Discharge: 2022-10-26 | Disposition: A | Discharge status: Home / Self Care | Payer: Medicaid Other | Attending: Emergency Medicine | Admitting: Emergency Medicine

## 2022-10-26 ENCOUNTER — Emergency Department: Payer: Medicaid Other

## 2022-10-26 DIAGNOSIS — F1910 Other psychoactive substance abuse, uncomplicated: Secondary | ICD-10-CM

## 2022-10-26 HISTORY — DX: Other pulmonary embolism without acute cor pulmonale: I26.99

## 2022-10-26 MED ORDER — ACETAMINOPHEN 500 MG PO TABS
1000.0000 mg | ORAL_TABLET | Freq: Once | ORAL | Status: AC
  Administered 2022-10-26: 1000 mg via ORAL
  Filled 2022-10-26: qty 2

## 2022-10-26 MED ORDER — OXYCODONE HCL 5 MG PO TABS
5.0000 mg | ORAL_TABLET | Freq: Once | ORAL | Status: AC
  Administered 2022-10-26: 5 mg via ORAL
  Filled 2022-10-26: qty 1
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
## 2022-10-29 ENCOUNTER — Encounter: Payer: Self-pay | Admitting: Emergency Medicine

## 2022-10-29 ENCOUNTER — Emergency Department
Admission: EM | Admit: 2022-10-29 | Discharge: 2022-10-30 | Disposition: A | Discharge status: Home / Self Care | Payer: No Typology Code available for payment source | Attending: Emergency Medicine | Admitting: Emergency Medicine

## 2022-10-29 DIAGNOSIS — R7401 Elevation of levels of liver transaminase levels: Secondary | ICD-10-CM | POA: Insufficient documentation

## 2022-10-29 DIAGNOSIS — R45851 Suicidal ideations: Secondary | ICD-10-CM | POA: Diagnosis not present

## 2022-10-29 DIAGNOSIS — F1024 Alcohol dependence with alcohol-induced mood disorder: Secondary | ICD-10-CM | POA: Diagnosis not present

## 2022-10-29 DIAGNOSIS — F1994 Other psychoactive substance use, unspecified with psychoactive substance-induced mood disorder: Secondary | ICD-10-CM | POA: Diagnosis present

## 2022-10-29 DIAGNOSIS — M542 Cervicalgia: Secondary | ICD-10-CM | POA: Diagnosis not present

## 2022-10-29 DIAGNOSIS — Y906 Blood alcohol level of 120-199 mg/100 ml: Secondary | ICD-10-CM | POA: Insufficient documentation

## 2022-10-29 DIAGNOSIS — R748 Abnormal levels of other serum enzymes: Secondary | ICD-10-CM

## 2022-10-29 DIAGNOSIS — F1020 Alcohol dependence, uncomplicated: Secondary | ICD-10-CM | POA: Diagnosis present

## 2022-10-29 DIAGNOSIS — F1510 Other stimulant abuse, uncomplicated: Secondary | ICD-10-CM | POA: Diagnosis not present

## 2022-10-29 DIAGNOSIS — G8929 Other chronic pain: Secondary | ICD-10-CM | POA: Diagnosis not present

## 2022-10-29 DIAGNOSIS — Z5900 Homelessness unspecified: Secondary | ICD-10-CM

## 2022-10-29 DIAGNOSIS — I10 Essential (primary) hypertension: Secondary | ICD-10-CM | POA: Diagnosis not present

## 2022-10-29 DIAGNOSIS — F1590 Other stimulant use, unspecified, uncomplicated: Secondary | ICD-10-CM | POA: Diagnosis present

## 2022-10-29 DIAGNOSIS — F17200 Nicotine dependence, unspecified, uncomplicated: Secondary | ICD-10-CM | POA: Diagnosis present

## 2022-10-29 DIAGNOSIS — F1910 Other psychoactive substance abuse, uncomplicated: Secondary | ICD-10-CM | POA: Diagnosis not present

## 2022-10-29 DIAGNOSIS — F10939 Alcohol use, unspecified with withdrawal, unspecified: Secondary | ICD-10-CM | POA: Diagnosis present

## 2022-10-29 DIAGNOSIS — Z9119 Patient's noncompliance with other medical treatment and regimen due to financial hardship: Secondary | ICD-10-CM

## 2022-10-29 LAB — URINE DRUG SCREEN, QUALITATIVE (ARMC ONLY)
Amphetamines, Ur Screen: NOT DETECTED
Barbiturates, Ur Screen: NOT DETECTED
Benzodiazepine, Ur Scrn: NOT DETECTED
Cannabinoid 50 Ng, Ur ~~LOC~~: NOT DETECTED
Cocaine Metabolite,Ur ~~LOC~~: POSITIVE — AB
MDMA (Ecstasy)Ur Screen: NOT DETECTED
Methadone Scn, Ur: NOT DETECTED
Opiate, Ur Screen: NOT DETECTED
Phencyclidine (PCP) Ur S: NOT DETECTED
Tricyclic, Ur Screen: NOT DETECTED

## 2022-10-29 LAB — COMPREHENSIVE METABOLIC PANEL
ALT: 163 U/L — ABNORMAL HIGH (ref 0–44)
AST: 246 U/L — ABNORMAL HIGH (ref 15–41)
Albumin: 3.7 g/dL (ref 3.5–5.0)
Alkaline Phosphatase: 85 U/L (ref 38–126)
Anion gap: 11 (ref 5–15)
BUN: 14 mg/dL (ref 6–20)
CO2: 23 mmol/L (ref 22–32)
Calcium: 9.5 mg/dL (ref 8.9–10.3)
Chloride: 95 mmol/L — ABNORMAL LOW (ref 98–111)
Creatinine, Ser: 0.73 mg/dL (ref 0.61–1.24)
GFR, Estimated: >60 mL/min (ref >60)
Glucose, Bld: 165 mg/dL — ABNORMAL HIGH (ref 70–99)
Potassium: 3.3 mmol/L — ABNORMAL LOW (ref 3.5–5.1)
Sodium: 129 mmol/L — ABNORMAL LOW (ref 135–145)
Total Bilirubin: 0.8 mg/dL (ref 0.3–1.2)
Total Protein: 7.4 g/dL (ref 6.5–8.1)

## 2022-10-29 LAB — CBC
HCT: 41.6 % (ref 39.0–52.0)
Hemoglobin: 14.1 g/dL (ref 13.0–17.0)
MCH: 32.6 pg (ref 26.0–34.0)
MCHC: 33.9 g/dL (ref 30.0–36.0)
MCV: 96.1 fL (ref 80.0–100.0)
Platelets: 144 10*3/uL — ABNORMAL LOW (ref 150–400)
RBC: 4.33 MIL/uL (ref 4.22–5.81)
RDW: 12.1 % (ref 11.5–15.5)
WBC: 7 10*3/uL (ref 4.0–10.5)
nRBC: 0.3 % — ABNORMAL HIGH (ref 0.0–0.2)

## 2022-10-29 LAB — ACETAMINOPHEN LEVEL: Acetaminophen (Tylenol), Serum: <10 ug/mL — ABNORMAL LOW (ref 10–30)

## 2022-10-29 LAB — ETHANOL: Alcohol, Ethyl (B): 192 mg/dL — ABNORMAL HIGH (ref <10)

## 2022-10-29 LAB — SALICYLATE LEVEL: Salicylate Lvl: <7 mg/dL — ABNORMAL LOW (ref 7.0–30.0)

## 2022-10-29 NOTE — ED Triage Notes (Signed)
Pt presents via [facility] for detox and SI. Pt states that he has felt suicidal for a couple of days. Denies a plan but has been drinking more heavily and wants help with that. He notes drinking beer - last drink tonight ~4-5 beers. Pt calm and cooperative in triage. Denies CP or SOB.
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
## 2022-10-29 NOTE — ED Notes (Addendum)
Pt dressed out by this RN & EDT Maudie Mercury) belongings include:  1 pair of brown boots White socks Brown pants [employer] 1 gray necklace Gray jacket Green jacket 1 food stamp card 1 black comb [employer]

## 2022-10-30 DIAGNOSIS — F1910 Other psychoactive substance abuse, uncomplicated: Secondary | ICD-10-CM

## 2022-10-30 DIAGNOSIS — F1994 Other psychoactive substance use, unspecified with psychoactive substance-induced mood disorder: Secondary | ICD-10-CM | POA: Diagnosis not present

## 2022-10-30 MED ORDER — LORAZEPAM 2 MG PO TABS
0.0000 mg | ORAL_TABLET | Freq: Four times a day (QID) | ORAL | Status: DC
  Administered 2022-10-30: 1 mg via ORAL
  Filled 2022-10-30: qty 1

## 2022-10-30 MED ORDER — THIAMINE HCL 100 MG/ML IJ SOLN: 100.0000 mg | Freq: Every day | INTRAMUSCULAR | Status: DC

## 2022-10-30 MED ORDER — IBUPROFEN 600 MG PO TABS: 600.0000 mg | ORAL_TABLET | Freq: Once | ORAL | Status: DC

## 2022-10-30 MED ORDER — LORAZEPAM 2 MG PO TABS: 0.0000 mg | ORAL_TABLET | Freq: Two times a day (BID) | ORAL | Status: DC

## 2022-10-30 MED ORDER — ALUM & MAG HYDROXIDE-SIMETH 200-200-20 MG/5ML PO SUSP: 30.0000 mL | Freq: Four times a day (QID) | ORAL | Status: DC | PRN

## 2022-10-30 MED ORDER — POTASSIUM CHLORIDE CRYS ER 20 MEQ PO TBCR
40.0000 meq | EXTENDED_RELEASE_TABLET | Freq: Once | ORAL | Status: AC
  Administered 2022-10-30: 40 meq via ORAL
  Filled 2022-10-30: qty 2

## 2022-10-30 MED ORDER — THIAMINE MONONITRATE 100 MG PO TABS: 100.0000 mg | ORAL_TABLET | Freq: Every day | ORAL | Status: DC

## 2022-10-30 MED ORDER — IBUPROFEN 600 MG PO TABS
600.0000 mg | ORAL_TABLET | Freq: Four times a day (QID) | ORAL | Status: DC | PRN
  Administered 2022-10-30: 600 mg via ORAL
  Filled 2022-10-30: qty 1

## 2022-10-30 NOTE — Discharge Instructions (Addendum)
Please go to the following website to schedule new (and existing) patient appointments:   [email or web address]   The following is a list of primary care offices in the area who are accepting new patients at this time.  Please reach out to one of them directly and let them know you would like to schedule an appointment to follow up on an Emergency Department visit, and/or to establish a new primary care provider (PCP).  There are likely other primary care clinics in the are who are accepting new patients, but this is an excellent place to start:  [facility] Lead physician: Dr Angela Bacigalupo [address] [place], [place] [address] [phone]  [facility] Lead Physician: Dr Krichna Sowles [address], [place], [place] [address] [phone]  [facility]  Lead Physician: Dr Megan Johnson [address], [place], [place] [address] [phone]  [facility] [place] [facility] Lead Physician: Dr Alex Karamalegos [address], [place], [place] [address] [phone]  [facility] [place] Lead Physician: Dr Laura Berglund [address], [place], [place] [address] [phone]   

## 2022-10-30 NOTE — ED Provider Notes (Signed)
3:12 AM  Pt cleared by psychiatry.  Will discharge with outpatient resources.  Patient clinically sober, ambulating, tolerating p.o.   Zianna Dercole, Delice Bison, DO 10/30/22 [phone]

## 2022-10-30 NOTE — Consult Note (Signed)
[facility] Consult   Reason for Consult: Psychiatric Evaluation   Referring Physician: Dr. 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 male patient presented to [facility] ED via POV voluntarily complained of being in a lot of pain in his neck and back. The patient's BAL 192 mg/dl and UDS are remarkable for Cocaine. The patient is homeless. The patient shared that due to ongoing severe pain in his neck from a prior cervical injury, and because of this and his general health, he has felt helpless and like he wanted to kill himself. He has been drinking to self-medicate.   This provider saw the patient face-to-face, reviewed the chart, and consulted Dr. Ellender Hose on 10/29/2022 due to the patient's care. The EDP discussed with the patient that the patient does not meet the criteria for admission to the psychiatric inpatient unit.  On evaluation, the patient is alert and oriented x 4, calm, but in a lot of pain. The patient is cooperative and mood-congruent with affect.  The patient does not appear to be responding to internal or external stimuli. Neither is the patient presenting with any delusional thinking. The patient denies auditory or visual hallucinations. The patient admits to passive suicidal ideation. The patient denies homicidal or self-harm ideations. The patient is not  presenting with any psychotic or paranoid behaviors. During an encounter with the patient, he could answer questions appropriately.   HPI: Per Dr. Ellender Hose, Jose [name] is a 62 y.o. male  here with alcohol intoxication, neck pain, suicidal ideation. Pt reports that due to ongoing severe pain in his neck from a prior cervical injury, and because of this and his general health he has felt helpless and like he wanted to kill himself. He has been drinking to self medicate. He states he is always in pain and needs something "stronger" than tylenol for this. He reports he has been trying to get in with a NSG for his neck. No recent falls. No new numbness or weaknes    Past Psychiatric History:  Polysubstance abuse ([place])   Risk to Self:   Risk to Others:   Prior Inpatient Therapy:   Prior Outpatient Therapy:    Past Medical History:  Past Medical History:  Diagnosis Date   Hep C w/o coma, chronic ([place])    Hypertension    Polysubstance abuse ([place])    Pulmonary embolism ([place])     Past Surgical History:  Procedure Laterality Date   ESOPHAGOGASTRODUODENOSCOPY (EGD) WITH PROPOFOL N/A 03/02/2019   Procedure: ESOPHAGOGASTRODUODENOSCOPY (EGD) WITH PROPOFOL;  Surgeon: Georganna Skeans, MD;  Location: [facility];  Service: General;  Laterality: N/A;   IR GASTROSTOMY TUBE REMOVAL  05/04/2019   PEG PLACEMENT N/A 03/02/2019   Procedure: PERCUTANEOUS ENDOSCOPIC GASTROSTOMY (PEG) PLACEMENT;  Surgeon: Georganna Skeans, MD;  Location: [facility];  Service: General;  Laterality: N/A;   PERCUTANEOUS TRACHEOSTOMY N/A 02/12/2019   Procedure: PERCUTANEOUS TRACHEOSTOMY;  Surgeon: Violeta Gelinas, MD;  Location: [facility] OR;  Service: General;  Laterality: N/A;   Family History: History reviewed. No pertinent family history. Family Psychiatric  History: History reviewed. No pertinent family psychiatric history Social History:  Social History   Substance and Sexual Activity  Alcohol Use Yes   Alcohol/week: 2.0  standard drinks of alcohol   Types: 2 Cans of beer per week   Comment: 1 beer today     Social History   Substance and Sexual Activity  Drug Use Yes   Types: Marijuana    Social History   Socioeconomic History   Marital status: Divorced    Spouse name: Not on file   Number of children: Not on file   Years of education: Not on file   Highest education level: Not on file  Occupational History   Not on file  Tobacco Use   Smoking status: Every Day    Packs/day: 0.50    Years: 45.00    Total pack years: 22.50    Types: Cigarettes   Smokeless tobacco: Never  Vaping Use   Vaping Use: Never used  Substance and Sexual Activity   Alcohol use: Yes    Alcohol/week: 2.0 standard drinks of alcohol    Types: 2 Cans of beer per week    Comment: 1 beer today   Drug use: Yes    Types: Marijuana   Sexual activity: Never  Other Topics Concern   Not on file  Social History Narrative   ** Merged History Encounter **       Social Determinants of Health   Financial Resource Strain: Not on file  Food Insecurity: Not on file  Transportation Needs: Not on file  Physical Activity: Not on file  Stress: Not on file  Social Connections: Not on file   Additional Social History:    Allergies:  No Known Allergies  Labs:  Results for orders placed or performed during the hospital encounter of 10/29/22 (from the past 48 hour(s))  Comprehensive metabolic panel     Status: Abnormal   Collection Time: 10/29/22  9:39 PM  Result Value Ref Range   Sodium 129 (L) 135 - 145 mmol/L   Potassium 3.3 (L) 3.5 - 5.1 mmol/L   Chloride 95 (L) 98 - 111 mmol/L   CO2 23 22 - 32 mmol/L   Glucose, Bld 165 (H) 70 - 99 mg/dL    Comment: Glucose reference range applies only to samples taken after fasting for at least 8 hours.   BUN 14 6 - 20 mg/dL   Creatinine, Ser 5.00 0.61 - 1.24 mg/dL   Calcium 9.5 8.9 - 93.8 mg/dL   Total Protein 7.4 6.5 - 8.1 g/dL   Albumin 3.7 3.5 - 5.0 g/dL   AST 182 (H) 15 - 41  U/L   ALT 163 (H) 0 - 44 U/L   Alkaline Phosphatase 85 38 - 126 U/L   Total Bilirubin 0.8 0.3 - 1.2 mg/dL   GFR, Estimated >99 >37 mL/min    Comment: (NOTE) Calculated using the CKD-EPI Creatinine Equation (2021)    Anion gap 11 5 - 15    Comment: Performed at [facility], [address] [place], [place] [address]  Ethanol     Status: Abnormal   Collection Time: 10/29/22  9:39 PM  Result Value Ref Range   Alcohol, Ethyl (B) 192 (H) <10 mg/dL    Comment: (NOTE) Lowest detectable limit for serum alcohol is 10 mg/dL.  For  medical purposes only. Performed at [facility], [address] [place], [place] [address]   Salicylate level     Status: Abnormal   Collection Time: 10/29/22  9:39 PM  Result Value Ref Range   Salicylate Lvl <7.0 (L) 7.0 - 30.0 mg/dL    Comment: Performed at [facility], [address] [place], [place] [address]  Acetaminophen level     Status: Abnormal   Collection Time: 10/29/22  9:39 PM  Result Value Ref Range   Acetaminophen (Tylenol), Serum <10 (L) 10 - 30 ug/mL    Comment: (NOTE) Therapeutic concentrations vary significantly. A range of 10-30 ug/mL  may be an effective concentration for many patients. However, some  are best treated at concentrations outside of this range. Acetaminophen concentrations >150 ug/mL at 4 hours after ingestion  and >50 ug/mL at 12 hours after ingestion are often associated with  toxic reactions.  Performed at [facility], [address] [place], [place] [address]   cbc     Status: Abnormal   Collection Time: 10/29/22  9:39 PM  Result Value Ref Range   WBC 7.0 4.0 - 10.5 K/uL   RBC 4.33 4.22 - 5.81 MIL/uL   Hemoglobin 14.1 13.0 - 17.0 g/dL   HCT 18.4 03.7 - 54.3 %   MCV 96.1 80.0 - 100.0 fL   MCH 32.6 26.0 - 34.0 pg   MCHC 33.9 30.0 - 36.0 g/dL   RDW 60.6 77.0 - 34.0 %   Platelets 144 (L) 150 - 400 K/uL   nRBC 0.3 (H) 0.0 - 0.2 %    Comment: Performed at [facility], [address], [place], [place] [address]  Urine Drug Screen, Qualitative     Status: Abnormal   Collection Time: 10/29/22  9:39 PM  Result Value Ref Range   Tricyclic, Ur Screen NONE DETECTED NONE DETECTED   Amphetamines, Ur Screen NONE DETECTED NONE DETECTED   MDMA (Ecstasy)Ur Screen NONE DETECTED NONE DETECTED   Cocaine Metabolite,Ur [place] POSITIVE (A) NONE DETECTED   Opiate, Ur Screen NONE DETECTED NONE DETECTED   Phencyclidine (PCP) Ur S NONE DETECTED NONE DETECTED   Cannabinoid 50 Ng, Ur [place] NONE DETECTED NONE DETECTED   Barbiturates, Ur Screen NONE DETECTED NONE DETECTED   Benzodiazepine, Ur Scrn NONE DETECTED NONE DETECTED   Methadone Scn, Ur NONE DETECTED NONE DETECTED    Comment: (NOTE) Tricyclics + metabolites, urine    Cutoff 1000 ng/mL Amphetamines + metabolites, urine  Cutoff 1000 ng/mL MDMA (Ecstasy), urine              Cutoff 500 ng/mL Cocaine Metabolite, urine          Cutoff 300 ng/mL Opiate + metabolites, urine        Cutoff 300 ng/mL Phencyclidine (PCP), urine         Cutoff 25 ng/mL Cannabinoid, urine                 Cutoff 50 ng/mL Barbiturates + metabolites, urine  Cutoff 200 ng/mL Benzodiazepine, urine              Cutoff 200 ng/mL Methadone, urine                   Cutoff 300 ng/mL  The urine drug screen provides only a preliminary, unconfirmed analytical test result and should not be used for non-medical purposes. Clinical consideration and professional judgment should be applied to any positive drug screen result due to  possible interfering substances. A more specific alternate chemical method must be used in order to obtain a confirmed analytical result. Gas chromatography / mass spectrometry (GC/MS) is the preferred confirm atory method. Performed at [facility], [address] [place], [place] [address]     Current Facility-Administered Medications  Medication Dose Route Frequency Provider Last Rate Last Admin   ibuprofen  (ADVIL) tablet 600 mg  600 mg Oral Q6H PRN Duffy Bruce, MD   600 mg at 10/30/22 0051   Current Outpatient Medications  Medication Sig Dispense Refill   amLODipine (NORVASC) 5 MG tablet Take 1 tablet (5 mg total) by mouth daily. 30 tablet 1   apixaban (ELIQUIS) 5 MG TABS tablet Take 1 tablet (5 mg total) by mouth 2 (two) times daily. 60 tablet 1   ondansetron (ZOFRAN-ODT) 4 MG disintegrating tablet Take 1 tablet (4 mg total) by mouth every 8 (eight) hours as needed for nausea or vomiting. 20 tablet 0    Musculoskeletal: Strength & Muscle Tone: within normal limits Gait & Station: normal Patient leans: N/A Psychiatric Specialty Exam:  Presentation  General Appearance:  Bizarre  Eye Contact: Good  Speech: Clear and Coherent  Speech Volume: Decreased  Handedness: Right   Mood and Affect  Mood: Anxious  Affect: Blunt; Depressed   Thought Process  Thought Processes: Coherent  Descriptions of Associations:Intact  Orientation:Full (Time, Place and Person)  Thought Content:Logical  History of Schizophrenia/Schizoaffective disorder:No data recorded Duration of Psychotic Symptoms:No data recorded Hallucinations:Hallucinations: None  Ideas of Reference:None  Suicidal Thoughts:Suicidal Thoughts: Yes, Passive SI Passive Intent and/or Plan: Without Intent; Without Plan  Homicidal Thoughts:Homicidal Thoughts: No   Sensorium  Memory: Immediate Good; Recent Good; Remote Good  Judgment: Poor  Insight: Poor   Executive Functions  Concentration: Fair  Attention Span: Fair  Recall: [place] of Knowledge: Fair  Language: Fair   Psychomotor Activity  Psychomotor Activity: Psychomotor Activity: Decreased   Assets  Assets: Communication Skills; Resilience; Social Support   Sleep  Sleep: Sleep: Good Number of Hours of Sleep: 8   Physical Exam: Physical Exam Vitals and nursing note reviewed.  Constitutional:      Appearance:  Normal appearance. He is ill-appearing.  HENT:     Head: Normocephalic and atraumatic.     Right Ear: External ear normal.     Left Ear: External ear normal.     Nose: Nose normal.     Mouth/Throat:     Mouth: Mucous membranes are moist.  Cardiovascular:     Rate and Rhythm: Normal rate.     Pulses: Normal pulses.  Pulmonary:     Effort: Pulmonary effort is normal.  Musculoskeletal:        General: Normal range of motion.     Cervical back: Normal range of motion and neck supple.  Neurological:     Mental Status: He is alert and oriented to person, place, and time.     Motor: Weakness present.     Gait: Gait abnormal.  Psychiatric:        Attention and Perception: Attention and perception normal.        Mood and Affect: Mood is anxious and depressed. Affect is blunt and flat.        Speech: Speech is rapid and pressured and tangential.        Behavior: Behavior is slowed.        Thought Content: Thought content includes suicidal ideation.        Cognition  and Memory: Cognition and memory normal.        Judgment: Judgment is inappropriate.    Review of Systems  Psychiatric/Behavioral:  Positive for depression, substance abuse and suicidal ideas. The patient has insomnia.   All other systems reviewed and are negative.  Blood pressure 133/88, pulse (!) 106, temperature 98.1 F (36.7 C), temperature source Oral, resp. rate 18, height 5\' 8"  (1.727 m), weight 70.3 kg, SpO2 98 %. Body mass index is 23.57 kg/m.  Treatment Plan Summary: Plan   Patient does not meet the criteria for psychiatric inpatient admission  Disposition: No evidence of imminent risk to self or others at present.   Patient does not meet criteria for psychiatric inpatient admission. Supportive therapy provided about ongoing stressors.  Caroline Sauger, NP 10/30/2022 1:59 AM

## 2022-11-06 ENCOUNTER — Emergency Department: Payer: No Typology Code available for payment source

## 2022-11-06 ENCOUNTER — Other Ambulatory Visit: Payer: Self-pay

## 2022-11-06 ENCOUNTER — Emergency Department
Admission: EM | Admit: 2022-11-06 | Discharge: 2022-11-07 | Disposition: A | Discharge status: Home / Self Care | Payer: No Typology Code available for payment source | Attending: Emergency Medicine | Admitting: Emergency Medicine

## 2022-11-06 DIAGNOSIS — Z1152 Encounter for screening for COVID-19: Secondary | ICD-10-CM | POA: Diagnosis not present

## 2022-11-06 DIAGNOSIS — Y908 Blood alcohol level of 240 mg/100 ml or more: Secondary | ICD-10-CM | POA: Insufficient documentation

## 2022-11-06 DIAGNOSIS — F1910 Other psychoactive substance abuse, uncomplicated: Secondary | ICD-10-CM | POA: Diagnosis present

## 2022-11-06 DIAGNOSIS — F17210 Nicotine dependence, cigarettes, uncomplicated: Secondary | ICD-10-CM | POA: Insufficient documentation

## 2022-11-06 DIAGNOSIS — F10220 Alcohol dependence with intoxication, uncomplicated: Secondary | ICD-10-CM | POA: Diagnosis not present

## 2022-11-06 DIAGNOSIS — Z8616 Personal history of COVID-19: Secondary | ICD-10-CM | POA: Insufficient documentation

## 2022-11-06 DIAGNOSIS — F17200 Nicotine dependence, unspecified, uncomplicated: Secondary | ICD-10-CM | POA: Diagnosis present

## 2022-11-06 DIAGNOSIS — I10 Essential (primary) hypertension: Secondary | ICD-10-CM | POA: Diagnosis present

## 2022-11-06 DIAGNOSIS — G894 Chronic pain syndrome: Secondary | ICD-10-CM | POA: Insufficient documentation

## 2022-11-06 DIAGNOSIS — R45851 Suicidal ideations: Secondary | ICD-10-CM | POA: Diagnosis not present

## 2022-11-06 DIAGNOSIS — F10939 Alcohol use, unspecified with withdrawal, unspecified: Secondary | ICD-10-CM | POA: Diagnosis present

## 2022-11-06 DIAGNOSIS — F1590 Other stimulant use, unspecified, uncomplicated: Secondary | ICD-10-CM | POA: Diagnosis present

## 2022-11-06 DIAGNOSIS — F1020 Alcohol dependence, uncomplicated: Secondary | ICD-10-CM | POA: Diagnosis present

## 2022-11-06 DIAGNOSIS — B1920 Unspecified viral hepatitis C without hepatic coma: Secondary | ICD-10-CM | POA: Diagnosis present

## 2022-11-06 DIAGNOSIS — Z9119 Patient's noncompliance with other medical treatment and regimen due to financial hardship: Secondary | ICD-10-CM

## 2022-11-06 DIAGNOSIS — F1994 Other psychoactive substance use, unspecified with psychoactive substance-induced mood disorder: Secondary | ICD-10-CM | POA: Diagnosis present

## 2022-11-06 DIAGNOSIS — F1024 Alcohol dependence with alcohol-induced mood disorder: Secondary | ICD-10-CM | POA: Diagnosis not present

## 2022-11-06 DIAGNOSIS — Z5900 Homelessness unspecified: Secondary | ICD-10-CM

## 2022-11-06 LAB — COMPREHENSIVE METABOLIC PANEL
ALT: 264 U/L — ABNORMAL HIGH (ref 0–44)
AST: 361 U/L — ABNORMAL HIGH (ref 15–41)
Albumin: 4.4 g/dL (ref 3.5–5.0)
Alkaline Phosphatase: 97 U/L (ref 38–126)
Anion gap: 10 (ref 5–15)
BUN: 11 mg/dL (ref 6–20)
CO2: 26 mmol/L (ref 22–32)
Calcium: 9.9 mg/dL (ref 8.9–10.3)
Chloride: 103 mmol/L (ref 98–111)
Creatinine, Ser: 0.67 mg/dL (ref 0.61–1.24)
GFR, Estimated: >60 mL/min (ref >60)
Glucose, Bld: 74 mg/dL (ref 70–99)
Potassium: 3.6 mmol/L (ref 3.5–5.1)
Sodium: 139 mmol/L (ref 135–145)
Total Bilirubin: 0.7 mg/dL (ref 0.3–1.2)
Total Protein: 9 g/dL — ABNORMAL HIGH (ref 6.5–8.1)

## 2022-11-06 LAB — CBC
HCT: 46.5 % (ref 39.0–52.0)
Hemoglobin: 15.6 g/dL (ref 13.0–17.0)
MCH: 32.6 pg (ref 26.0–34.0)
MCHC: 33.5 g/dL (ref 30.0–36.0)
MCV: 97.3 fL (ref 80.0–100.0)
Platelets: 162 10*3/uL (ref 150–400)
RBC: 4.78 MIL/uL (ref 4.22–5.81)
RDW: 12 % (ref 11.5–15.5)
WBC: 7 10*3/uL (ref 4.0–10.5)
nRBC: 0 % (ref 0.0–0.2)

## 2022-11-06 LAB — SALICYLATE LEVEL: Salicylate Lvl: <7 mg/dL — ABNORMAL LOW (ref 7.0–30.0)

## 2022-11-06 LAB — URINE DRUG SCREEN, QUALITATIVE (ARMC ONLY)
Amphetamines, Ur Screen: NOT DETECTED
Barbiturates, Ur Screen: NOT DETECTED
Benzodiazepine, Ur Scrn: NOT DETECTED
Cannabinoid 50 Ng, Ur ~~LOC~~: NOT DETECTED
Cocaine Metabolite,Ur ~~LOC~~: POSITIVE — AB
MDMA (Ecstasy)Ur Screen: NOT DETECTED
Methadone Scn, Ur: NOT DETECTED
Opiate, Ur Screen: NOT DETECTED
Phencyclidine (PCP) Ur S: NOT DETECTED
Tricyclic, Ur Screen: NOT DETECTED

## 2022-11-06 LAB — PROTIME-INR
INR: 1 (ref 0.8–1.2)
Prothrombin Time: 13.3 seconds (ref 11.4–15.2)

## 2022-11-06 LAB — RESP PANEL BY RT-PCR (RSV, FLU A&B, COVID)  RVPGX2
Influenza A by PCR: NEGATIVE
Influenza B by PCR: NEGATIVE
Resp Syncytial Virus by PCR: NEGATIVE
SARS Coronavirus 2 by RT PCR: NEGATIVE

## 2022-11-06 LAB — ACETAMINOPHEN LEVEL: Acetaminophen (Tylenol), Serum: <10 ug/mL — ABNORMAL LOW (ref 10–30)

## 2022-11-06 LAB — ETHANOL: Alcohol, Ethyl (B): 245 mg/dL — ABNORMAL HIGH (ref <10)

## 2022-11-06 NOTE — ED Notes (Addendum)
Pt changed out of clothes in triage room 3 with this Probation officer and EDT [employer].  Pt changed into behavioral health paper scrubs.    Pt belongings:  [employer] t shirt  Dark green jacket Burnt orange sweatpants  Gray and black boots Orange lighter  Pair of grey socks Plaid boxer shorts  Gray colored necklace (placed in cup w/belongings)

## 2022-11-06 NOTE — ED Triage Notes (Addendum)
Pt presents to ER with c/o neck pain, hip pain, and alcohol intoxication.  Pt states he has had a "couple beers" tonight.  Pt states pain in neck and hip pain is chronic.  Pt endorses SI in triage.  Pt is A&O x4 and in NAD.  Pt unsteady on feet when taken to restroom.

## 2022-11-06 NOTE — ED Provider Notes (Signed)
[facility] Provider Note    Event Date/Time   First MD Initiated Contact with Patient 11/06/22 2141     (approximate)   History   Neck Pain and Psychiatric Evaluation   HPI  Jose [name] is a 62 y.o. male  here with multiple complaints. Pt intoxicated, somewhat limiting history. He states he is here because he is tired of being in pain "all the time" and is feeling suicidal. States he does not want to live with this much pain. States he needs something strong for pain and that [facility] gave him "oxycodone and morphine" and that helped. He also reports increasing abdominal distension, with increasing pain. He drinks regularly. Denies any fevers.        Physical Exam   Triage Vital Signs: ED Triage Vitals  Enc Vitals Group     BP 11/06/22 2107 135/87     Pulse Rate 11/06/22 2107 85     Resp 11/06/22 2107 20     Temp 11/06/22 2107 98.4 F (36.9 C)     Temp Source 11/06/22 2107 Oral     SpO2 11/06/22 2107 99 %     Weight 11/06/22 2107 150 lb (68 kg)     Height 11/06/22 2107 5' 8"$  (1.727 m)     Head Circumference --      Peak Flow --      Pain Score 11/06/22 2115 10     Pain Loc --      Pain Edu? --      Excl. in [place]? --     Most recent vital signs: Vitals:   11/06/22 2107  BP: 135/87  Pulse: 85  Resp: 20  Temp: 98.4 F (36.9 C)  SpO2: 99%     General: Awake, no distress.  CV:  Good peripheral perfusion. RRR. Resp:  Normal effort. Lungs clear. Abd:  Moderate distension, no guarding or rebound. Hypoactive bowel sounds. Other:  Speech slightly slurred from apparent intoxication. +SI.   ED Results / Procedures / Treatments   Labs (all labs ordered are listed, but only abnormal results are displayed) Labs Reviewed  COMPREHENSIVE METABOLIC PANEL - Abnormal; Notable for the following components:      Result Value   Total Protein 9.0 (*)    AST 361 (*)    ALT 264 (*)    All other components within normal limits  ETHANOL -  Abnormal; Notable for the following components:   Alcohol, Ethyl (B) 245 (*)    All other components within normal limits  SALICYLATE LEVEL - Abnormal; Notable for the following components:   Salicylate Lvl [id number] (*)    All other components within normal limits  ACETAMINOPHEN LEVEL - Abnormal; Notable for the following components:   Acetaminophen (Tylenol), Serum <10 (*)    All other components within normal limits  RESP PANEL BY RT-PCR (RSV, FLU A&B, COVID)  RVPGX2  CBC  URINE DRUG SCREEN, QUALITATIVE ([facility] ONLY)  LIPASE, BLOOD  PROTIME-INR     EKG    RADIOLOGY CT Pending   I also independently reviewed and agree with radiologist interpretations.   PROCEDURES:  Critical Care performed: No   MEDICATIONS ORDERED IN ED: Medications - No data to display   IMPRESSION / MDM / [place] / ED COURSE  I reviewed the triage vital signs and the nursing notes.  Differential diagnosis includes, but is not limited to, substance-induced mood d/o, malingering, acute on chronic pain, adjustment disorder, possible cirrhosis.  Patient's presentation is most consistent with acute presentation with potential threat to life or bodily function.  62 yo M with h/o chronic pain, alcoholism, depression, here with abdominal pain, chronic neck pain, and suicidal ideation. Re: his pain and SI, this seems to be a chronic issue for which he has been seen here multiple times. He is currently intoxicated. Will ask pscyh to eval once sober.   Otherwise, re: his abdominal pain - he does have some mild distension which I suspect could be ascites. He has a h/o heavy EtOH use. No signs of peritonitis clinically. He does not appear septic. LFTs do seem to be trending up per review of records. Will obtain CT to eval for any surgical/acute emergent pathologies.Bili is normal.   FINAL CLINICAL IMPRESSION(S) / ED DIAGNOSES   Final diagnoses:  Chronic pain syndrome   Alcohol dependence with uncomplicated intoxication ([place])  Suicidal ideation     Rx / DC Orders   ED Discharge Orders     None        Note:  This document was prepared using Dragon voice recognition software and may include unintentional dictation errors.   Duffy Bruce, MD 11/06/22 [phone]

## 2022-11-06 NOTE — ED Notes (Signed)
Pt reports abd pain, neck pain, back pain.  No n/v/d.

## 2022-11-06 NOTE — ED Notes (Signed)
Pt moved to 19h

## 2022-11-07 ENCOUNTER — Emergency Department: Payer: No Typology Code available for payment source

## 2022-11-07 DIAGNOSIS — F10220 Alcohol dependence with intoxication, uncomplicated: Secondary | ICD-10-CM | POA: Diagnosis not present

## 2022-11-07 LAB — LIPASE, BLOOD: Lipase: 55 U/L — ABNORMAL HIGH (ref 11–51)

## 2022-11-07 MED ORDER — LIDOCAINE 5 % EX PTCH
1.0000 | MEDICATED_PATCH | Freq: Once | CUTANEOUS | Status: DC
  Administered 2022-11-07: 1 via TRANSDERMAL
  Filled 2022-11-07: qty 1

## 2022-11-07 MED ORDER — IBUPROFEN 600 MG PO TABS
600.0000 mg | ORAL_TABLET | Freq: Once | ORAL | Status: AC
  Administered 2022-11-07: 600 mg via ORAL
  Filled 2022-11-07: qty 1

## 2022-11-07 MED ORDER — IOHEXOL 300 MG/ML  SOLN
100.0000 mL | Freq: Once | INTRAMUSCULAR | Status: AC | PRN
  Administered 2022-11-07: 100 mL via INTRAVENOUS

## 2022-11-07 NOTE — ED Notes (Signed)
Verified correct patient and correct discharge papers given. Pt alert and oriented X 4, stable for discharge. RR even and unlabored, color WNL. Discussed discharge instructions and follow-up as directed. Discharge medications discussed, when prescribed. Pt had opportunity to ask questions, and RN available to provide patient and/or family education. Brother picking up patient at 1000AM, pt prefers to wait in lobby. EDP, [place] approved of pt waiting for ride in lobby.  Pt left with all of belongings, provided drink on discharge per request.

## 2022-11-07 NOTE — BH Assessment (Signed)
Comprehensive Clinical Assessment (CCA) Note  11/07/2022 Trayten Arth [id number]  Chief Complaint: Patient is a 62 year old male presenting to [facility] ED voluntarily. Per triage note Pt presents to ER with c/o neck pain, hip pain, and alcohol intoxication.  Pt states he has had a "couple beers" tonight.  Pt states pain in neck and hip pain is chronic.  Pt endorses SI in triage.  Pt is A&O x4 and in NAD.  Pt unsteady on feet when taken to restroom. During assessment patient appears alert and oriented x4, calm and cooperative, mood appears depressed. Patient reports "I was thinking about hurting myself." Patient reports drinking alcohol daily "6 beers" on average and has a desire to get help with his alcohol abuse. Patient denies HI/AH/VH.  Per Psyc NP Ysidro Evert patient to be discharged in the morning with resources for outpatient Chief Complaint  Patient presents with   Neck Pain   Psychiatric Evaluation   Visit Diagnosis: Alcohol use disorder, severe. Substance-induced mood disorder    CCA Screening, Triage and Referral (STR)  Patient Reported Information How did you hear about [place]? Self  Referral name: No data recorded Referral phone number: No data recorded  Whom do you see for routine medical problems? No data recorded Practice/Facility Name: No data recorded Practice/Facility Phone Number: No data recorded Name of Contact: No data recorded Contact Number: No data recorded Contact Fax Number: No data recorded Prescriber Name: No data recorded Prescriber Address (if known): No data recorded  What Is the Reason for Your Visit/Call Today? Pt presents to ER with c/o neck pain, hip pain, and alcohol intoxication.  Pt states he has had a "couple beers" tonight.  Pt states pain in neck and hip pain is chronic.  Pt endorses SI in triage.  Pt is A&O x4 and in NAD.  Pt unsteady on feet when taken to restroom.  How Long Has This Been Causing You Problems? > than 6 months  What  Do You Feel Would Help You the Most Today? Alcohol or Drug Use Treatment   Have You Recently Been in Any Inpatient Treatment (Hospital/Detox/Crisis Center/28-Day Program)? No data recorded Name/Location of Program/Hospital:No data recorded How Long Were You There? No data recorded When Were You Discharged? No data recorded  Have You Ever Received Services From [facility] Before? No data recorded Who Do You See at [facility]? No data recorded  Have You Recently Had Any Thoughts About Hurting Yourself? Yes  Are You Planning to Commit Suicide/Harm Yourself At This time? No   Have you Recently Had Thoughts About [place]? No  Explanation: No data recorded  Have You Used Any Alcohol or Drugs in the Past 24 Hours? Yes  How Long Ago Did You Use Drugs or Alcohol? No data recorded What Did You Use and How Much? Alcohol "6 beers"   Do You Currently Have a Therapist/Psychiatrist? No  Name of Therapist/Psychiatrist: No data recorded  Have You Been Recently Discharged From Any Office Practice or Programs? No  Explanation of Discharge From Practice/Program: No data recorded    CCA Screening Triage Referral Assessment Type of Contact: Face-to-Face  Is this Initial or Reassessment? No data recorded Date Telepsych consult ordered in [facility]:  No data recorded Time Telepsych consult ordered in [facility]:  No data recorded  Patient Reported Information Reviewed? No data recorded Patient Left Without Being Seen? No data recorded Reason for Not Completing Assessment: No data recorded  Collateral Involvement: No data recorded  Does  Patient Have a Stage manager Guardian? No data recorded Name and Contact of Legal Guardian: No data recorded If Minor and Not Living with Parent(s), Who has Custody? No data recorded Is CPS involved or ever been involved? Never  Is APS involved or ever been involved? Never   Patient Determined To Be At Risk for Harm To Self or Others Based on  Review of Patient Reported Information or Presenting Complaint? No  Method: No data recorded Availability of Means: No data recorded Intent: No data recorded Notification Required: No data recorded Additional Information for Danger to Others Potential: No data recorded Additional Comments for Danger to Others Potential: No data recorded Are There Guns or Other Weapons in Your Home? No  Types of Guns/Weapons: No data recorded Are These Weapons Safely Secured?                            No data recorded Who Could Verify You Are Able To Have These Secured: No data recorded Do You Have any Outstanding Charges, Pending Court Dates, Parole/Probation? No data recorded Contacted To Inform of Risk of Harm To Self or Others: No data recorded  Location of Assessment: [facility] ED   Does Patient Present under Involuntary Commitment? No  IVC Papers Initial File Date: No data recorded  [place] of Residence: [place]   Patient Currently Receiving the Following Services: No data recorded  Determination of Need: Emergent (2 hours)   Options For Referral: No data recorded    CCA Biopsychosocial Intake/Chief Complaint:  No data recorded Current Symptoms/Problems: No data recorded  Patient Reported Schizophrenia/Schizoaffective Diagnosis in Past: No   Strengths: Patient is able to communicate his needs  Preferences: No data recorded Abilities: No data recorded  Type of Services Patient Feels are Needed: No data recorded  Initial Clinical Notes/Concerns: No data recorded  Mental Health Symptoms Depression:   Change in energy/activity; Difficulty Concentrating; Fatigue; Hopelessness   Duration of Depressive symptoms:  Greater than two weeks   Mania:   None   Anxiety:    Difficulty concentrating; Fatigue   Psychosis:   None   Duration of Psychotic symptoms: No data recorded  Trauma:   None   Obsessions:   None   Compulsions:   None   Inattention:   None    Hyperactivity/Impulsivity:   None   Oppositional/Defiant Behaviors:   None   Emotional Irregularity:   None   Other Mood/Personality Symptoms:  No data recorded   Mental Status Exam Appearance and self-care  Stature:   Average   Weight:   Average weight   Clothing:   Disheveled   Grooming:   Neglected   Cosmetic use:   None   Posture/gait:   Normal   Motor activity:   Not Remarkable   Sensorium  Attention:   Normal   Concentration:   Normal   Orientation:   X5   Recall/memory:   Normal   Affect and Mood  Affect:   Appropriate   Mood:   Depressed   Relating  Eye contact:   Normal   Facial expression:   Responsive   Attitude toward examiner:   Cooperative   Thought and Language  Speech flow:  Clear and Coherent   Thought content:   Appropriate to Mood and Circumstances   Preoccupation:   None   Hallucinations:   None   Organization:  No data recorded  [employer] of Knowledge:  Fair   Intelligence:   Average   Abstraction:   Normal   Judgement:   Impaired   Reality Testing:   Adequate   Insight:   Lacking   Decision Making:   Normal   Social Functioning  Social Maturity:   Responsible   Social Judgement:   Normal   Stress  Stressors:   Housing; Teacher, music Ability:   Deficient supports   Skill Deficits:   None   Supports:   Support needed     Religion: Religion/Spirituality Are You A Religious Person?: No  Leisure/Recreation: Leisure / Recreation Do You Have Hobbies?: No  Exercise/Diet: Exercise/Diet Do You Exercise?: No Have You Gained or Lost A Significant Amount of Weight in the Past Six Months?: No Do You Follow a Special Diet?: No Do You Have Any Trouble Sleeping?: No   CCA Employment/Education Employment/Work Situation: Employment / Work Situation Employment Situation: Unemployed Has Patient ever Been in Passenger transport manager?:  No  Education: Education Is Patient Currently Attending School?: No Did You Have An Individualized Education Program (IIEP): No Did You Have Any Difficulty At [employer]?: No Patient's Education Has Been Impacted by Current Illness: No   CCA Family/Childhood History Family and Relationship History: Family history Marital status: Single Does patient have children?: No  Childhood History:  Childhood History By whom was/is the patient raised?: Both parents Did patient suffer any verbal/emotional/physical/sexual abuse as a child?: No Did patient suffer from severe childhood neglect?: No Has patient ever been sexually abused/assaulted/raped as an adolescent or adult?: No Was the patient ever a victim of a crime or a disaster?: No Witnessed domestic violence?: No Has patient been affected by domestic violence as an adult?: No  Child/Adolescent Assessment:     CCA Substance Use Alcohol/Drug Use: Alcohol / Drug Use Pain Medications: See PTA Prescriptions: See PTA Over the Counter: See PTA History of alcohol / drug use?: Yes Longest period of sobriety (when/how long): Unable to quantify Negative Consequences of Use: Personal relationships, Financial, Work / School Withdrawal Symptoms: Tremors Substance #1 Name of Substance 1: Alcohol 1 - Age of First Use: Unknown age 40 - Amount (size/oz): Patient reports drinking "6 beers" 1 - Frequency: daily 1 - Last Use / Amount: 11/06/22                       ASAM's:  Six Dimensions of Multidimensional Assessment  Dimension 1:  Acute Intoxication and/or Withdrawal Potential:   Dimension 1:  Description of individual's past and current experiences of substance use and withdrawal: Pt is a daily, high risk drinker  Dimension 2:  Biomedical Conditions and Complications:   Dimension 2:  Description of patient's biomedical conditions and  complications: Pt has a hx of hypertension; does not take medications per chart  Dimension 3:   Emotional, Behavioral, or Cognitive Conditions and Complications:     Dimension 4:  Readiness to Change:     Dimension 5:  Relapse, Continued use, or Continued Problem Potential:     Dimension 6:  Recovery/Living Environment:     ASAM Severity Score: ASAM's Severity Rating Score: 18  ASAM Recommended Level of Treatment: ASAM Recommended Level of Treatment: Level III Residential Treatment   Substance use Disorder (SUD) Substance Use Disorder (SUD)  Checklist Symptoms of Substance Use: Continued use despite having a persistent/recurrent physical/psychological problem caused/exacerbated by use, Continued use despite persistent or recurrent social, interpersonal problems, caused or exacerbated by use, Evidence of tolerance, Persistent desire or  unsuccessful efforts to cut down or control use, Presence of craving or strong urge to use, Recurrent use that results in a failure to fulfill major role obligations (work, school, home), Social, occupational, recreational activities given up or reduced due to use, Substance(s) often taken in larger amounts or over longer times than was intended, Large amounts of time spent to obtain, use or recover from the substance(s)  Recommendations for Services/Supports/Treatments: Recommendations for Services/Supports/Treatments Recommendations For Services/Supports/Treatments: Detox, Individual Therapy, CD-IOP Intensive Chemical Dependency Program, IOP (Intensive Outpatient Program)  DSM5 Diagnoses: Patient Active Problem List   Diagnosis Date Noted   Substance abuse ([place]) 10/30/2022   Protein-calorie malnutrition, severe 10/26/2021   Essential hypertension 10/25/2021   Syncope and collapse 10/25/2021   Pulmonary embolism ([place]) 10/24/2021   Homeless 10/24/2021   Patient's noncompliance with other medical treatment and regimen due to financial hardship 10/24/2021   Acute pulmonary embolism ([place]) 10/06/2021   Tracheostomy status ([place])    SDH (subdural hematoma)  ([place]) 01/23/2019   Sedative, hypnotic or anxiolytic use disorder, severe, 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   Substance induced mood disorder ([place]) 11/03/2015   Alcohol use disorder, severe, dependence ([place]) 10/24/2015   Alcohol withdrawal ([place]) 10/24/2015   Stimulant use disorder ([place]) (cocaine) 10/24/2015   Tobacco use disorder 10/24/2015   Hepatitis C 09/06/2015    Patient Centered Plan: Patient is on the following Treatment Plan(s):  Depression and Substance Abuse   Referrals to Alternative Service(s): Referred to Alternative Service(s):   Place:   Date:   Time:    Referred to Alternative Service(s):   Place:   Date:   Time:    Referred to Alternative Service(s):   Place:   Date:   Time:    Referred to Alternative Service(s):   Place:   Date:   Time:      @BHCOLLABOFCARE$ @  [employer], LCAS-A

## 2022-11-07 NOTE — ED Notes (Signed)
REPORT TO ALLY, RN.

## 2022-11-07 NOTE — ED Provider Notes (Addendum)
Emergency Medicine Observation Re-evaluation Note  Jose [name] is a 62 y.o. male, seen on rounds today.  Pt initially presented to the ED for complaints of Neck Pain and Psychiatric Evaluation  Currently, the patient is no acute distress. Denies any concerns at this time.  Physical Exam  Blood pressure 135/87, pulse 85, temperature 98.4 F (36.9 C), temperature source Oral, resp. rate 20, height 5' 8"$  (1.727 m), weight 68 kg, SpO2 99 %.    1:41 AM Assumed care for off going team.   Blood pressure 135/87, pulse 85, temperature 98.4 F (36.9 C), temperature source Oral, resp. rate 20, height 5' 8"$  (1.727 m), weight 68 kg, SpO2 99 %.  See their HPI for full report but in brief pending CT  Reviewed the CT personally interpreted and patient has some hepatic steatosis.  Lipase was slightly elevated but no signs of pancreatitis on his CT imaging.   IMPRESSION: 1. Hepatic steatosis. 2. Aortic atherosclerosis.  Patient is medically cleared to be dispositioned by psych    Vanessa [place], MD 11/07/22 0142  4:22 AM patient was seen by psychiatry does not meet criteria for psychiatric inpatient admission.  Patient is requesting discharge to the waiting room for his brother to pick him up this morning.  He denies any SI to me.  He reports that he does have an issue with alcohol and we discussed trying to cut down on the wrist to his liver.  He expressed understanding.  Patient is alert and oriented x 3 ambulates with a steady gait has tolerated p.o.  He has no evidence of alcohol intoxication at this time.  He he is clinically sober.  When I went to go try to discharge patient he was now asleep in the hallway bed so we will wait that he wakes up to discharge patient in the morning.   6:35 AM upon attempt to try to discharge patient he does report now having a headache.  He reports this started about 2 to 3 hours ago.  He states that he is not sure if he could have hit his head on  something while intoxicated last night.  Will get CT imaging and patient will be most likely discharged this is reassuring  CT imaging is reassuring continues to deny any SI, he would like to be discharged to the waiting room so that his brother can come pick him up.  He has been here for over 10 hours and is clinically sober    Vanessa [place], MD 11/07/22 Judie Bonus, MD 11/07/22 [id number]    Vanessa [place], MD 11/07/22 [phone]

## 2022-11-07 NOTE — ED Notes (Signed)
vol/consult done /pt does not meet critrteria for psychiatric inpatient admission..patient remains in ED & can be discharged in the morning considering he remains mentally stable.Marland Kitchen

## 2022-11-07 NOTE — Consult Note (Signed)
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 male patient presented to [facility] ED via POV voluntarily complained of consuming alcohol and having passive suicidal ideation. The patient's BAL 245 mg/dl and UDS are remarkable for Cocaine. The patient is homeless.  This provider saw the patient face-to-face, reviewed the chart, and consulted Dr. Ellender Hose on 11/06/2022 due to the patient's care. This Probation officer discussed with the EDP and also shared that the patient may remain in the ED to metabolize illicit substances. He can be discharged in the morning, considering he remains mentally stable.   On evaluation, the patient is alert and oriented x 4, calm. The patient is cooperative and mood-congruent with affect. The patient does not appear to be responding to internal or external stimuli. Neither is the patient presenting with any delusional thinking. The patient denies auditory or visual hallucinations. The patient admits to passive suicidal ideation. The patient denies homicidal or self-harm ideations. The patient is not presenting with any psychotic or paranoid behaviors. During an encounter with the patient, he could answer questions appropriately.   HPI: Per Dr.  Ellender Hose, Jose [name] is a 62 y.o. male  here with multiple complaints. Pt intoxicated, somewhat limiting history. He states he is here because he is tired of being in pain "all the time" and is feeling suicidal. States he does not want to live with this much pain. States he needs something strong for pain and that [facility] gave him "oxycodone and morphine" and that helped. He also reports increasing abdominal distension, with increasing pain. He drinks regularly. Denies any fevers.    Past Psychiatric History:  Polysubstance abuse ([place])  Risk to Self:   Risk to Others:   Prior Inpatient Therapy:   Prior Outpatient Therapy:    Past Medical History:  Past Medical History:  Diagnosis Date   Hep C w/o coma, chronic ([place])    Hypertension    Polysubstance abuse ([place])    Pulmonary embolism ([place])     Past Surgical History:  Procedure Laterality Date   ESOPHAGOGASTRODUODENOSCOPY (EGD) WITH PROPOFOL N/A 03/02/2019   Procedure: ESOPHAGOGASTRODUODENOSCOPY (EGD) WITH PROPOFOL;  Surgeon: Georganna Skeans, MD;  Location: [facility];  Service: General;  Laterality: N/A;   IR GASTROSTOMY TUBE REMOVAL  05/04/2019   PEG PLACEMENT N/A 03/02/2019   Procedure: PERCUTANEOUS ENDOSCOPIC GASTROSTOMY (PEG) PLACEMENT;  Surgeon: Georganna Skeans, MD;  Location: [facility];  Service: General;  Laterality: N/A;   PERCUTANEOUS TRACHEOSTOMY N/A 02/12/2019   Procedure: PERCUTANEOUS TRACHEOSTOMY;  Surgeon: Georganna Skeans, MD;  Location: [facility];  Service: General;  Laterality: N/A;   Family History: History reviewed. No pertinent family history. Family Psychiatric  History: History reviewed. No pertinent family psychiatric history  Social History:  Social History   Substance and Sexual Activity  Alcohol Use  Yes   Alcohol/week: 2.0 standard drinks of alcohol   Types: 2 Cans of beer per week   Comment: 1 beer today     Social History   Substance and Sexual Activity  Drug Use Yes   Types: Marijuana    Social  History   Socioeconomic History   Marital status: Divorced    Spouse name: Not on file   Number of children: Not on file   Years of education: Not on file   Highest education level: Not on file  Occupational History   Not on file  Tobacco Use   Smoking status: Every Day    Packs/day: 0.50    Years: 45.00    Total pack years: 22.50    Types: Cigarettes   Smokeless tobacco: Never  Vaping Use   Vaping Use: Never used  Substance and Sexual Activity   Alcohol use: Yes    Alcohol/week: 2.0 standard drinks of alcohol    Types: 2 Cans of beer per week    Comment: 1 beer today   Drug use: Yes    Types: Marijuana   Sexual activity: Never  Other Topics Concern   Not on file  Social History Narrative   ** Merged History Encounter **       Social Determinants of Health   Financial Resource Strain: Not on file  Food Insecurity: Not on file  Transportation Needs: Not on file  Physical Activity: Not on file  Stress: Not on file  Social Connections: Not on file   Additional Social History:    Allergies:  No Known Allergies  Labs:  Results for orders placed or performed during the hospital encounter of 11/06/22 (from the past 48 hour(s))  Comprehensive metabolic panel     Status: Abnormal   Collection Time: 11/06/22  9:29 PM  Result Value Ref Range   Sodium 139 135 - 145 mmol/L   Potassium 3.6 3.5 - 5.1 mmol/L   Chloride 103 98 - 111 mmol/L   CO2 26 22 - 32 mmol/L   Glucose, Bld 74 70 - 99 mg/dL    Comment: Glucose reference range applies only to samples taken after fasting for at least 8 hours.   BUN 11 6 - 20 mg/dL   Creatinine, Ser 0.67 0.61 - 1.24 mg/dL   Calcium 9.9 8.9 - 10.3 mg/dL   Total Protein 9.0 (H) 6.5 - 8.1 g/dL   Albumin 4.4 3.5 - 5.0 g/dL   AST 361 (H) 15 - 41 U/L   ALT 264 (H) 0 - 44 U/L   Alkaline Phosphatase 97 38 - 126 U/L   Total Bilirubin 0.7 0.3 - 1.2 mg/dL   GFR, Estimated >60 >60 mL/min    Comment: (NOTE) Calculated using the CKD-EPI  Creatinine Equation (2021)    Anion gap 10 5 - 15    Comment: Performed at [facility], [address] [place], [place] [address]  Ethanol     Status: Abnormal   Collection Time: 11/06/22  9:29 PM  Result Value Ref Range   Alcohol, Ethyl (B) 245 (H) <10 mg/dL    Comment: (NOTE) Lowest detectable limit for serum alcohol is 10 mg/dL.  For medical purposes only. Performed at [facility], [place], [place] [id number]   Salicylate level     Status: Abnormal   Collection Time: 11/06/22  9:29 PM  Result Value Ref Range   Salicylate Lvl [id number] (L) 7.0 - 30.0 mg/dL    Comment:  Performed at [facility], [address] [place], [place] [address]  Acetaminophen level     Status: Abnormal   Collection Time: 11/06/22  9:29 PM  Result Value Ref Range   Acetaminophen (Tylenol), Serum <10 (L) 10 - 30 ug/mL    Comment: (NOTE) Therapeutic concentrations vary significantly. A range of 10-30 ug/mL  may be an effective concentration for many patients. However, some  are best treated at concentrations outside of this range. Acetaminophen concentrations >150 ug/mL at 4 hours after ingestion  and >50 ug/mL at 12 hours after ingestion are often associated with  toxic reactions.  Performed at [facility], [address] [place], [place] [address]   cbc     Status: None   Collection Time: 11/06/22  9:29 PM  Result Value Ref Range   WBC 7.0 4.0 - 10.5 K/uL   RBC 4.78 4.22 - 5.81 MIL/uL   Hemoglobin 15.6 13.0 - 17.0 g/dL   HCT 46.5 39.0 - 52.0 %   MCV 97.3 80.0 - 100.0 fL   MCH 32.6 26.0 - 34.0 pg   MCHC 33.5 30.0 - 36.0 g/dL   RDW 12.0 11.5 - 15.5 %   Platelets 162 150 - 400 K/uL   nRBC 0.0 0.0 - 0.2 %    Comment: Performed at [facility], [address] [place], [place] [address]  Urine Drug Screen, Qualitative     Status: Abnormal   Collection Time: 11/06/22  9:29 PM  Result Value Ref Range   Tricyclic, Ur Screen NONE  DETECTED NONE DETECTED   Amphetamines, Ur Screen NONE DETECTED NONE DETECTED   MDMA (Ecstasy)Ur Screen NONE DETECTED NONE DETECTED   Cocaine Metabolite,Ur [place] POSITIVE (A) NONE DETECTED   Opiate, Ur Screen NONE DETECTED NONE DETECTED   Phencyclidine (PCP) Ur S NONE DETECTED NONE DETECTED   Cannabinoid 50 Ng, Ur [place] NONE DETECTED NONE DETECTED   Barbiturates, Ur Screen NONE DETECTED NONE DETECTED   Benzodiazepine, Ur Scrn NONE DETECTED NONE DETECTED   Methadone Scn, Ur NONE DETECTED NONE DETECTED    Comment: (NOTE) Tricyclics + metabolites, urine    Cutoff 1000 ng/mL Amphetamines + metabolites, urine  Cutoff 1000 ng/mL MDMA (Ecstasy), urine              Cutoff 500 ng/mL Cocaine Metabolite, urine          Cutoff 300 ng/mL Opiate + metabolites, urine        Cutoff 300 ng/mL Phencyclidine (PCP), urine         Cutoff 25 ng/mL Cannabinoid, urine                 Cutoff 50 ng/mL Barbiturates + metabolites, urine  Cutoff 200 ng/mL Benzodiazepine, urine              Cutoff 200 ng/mL Methadone, urine                   Cutoff 300 ng/mL  The urine drug screen provides only a preliminary, unconfirmed analytical test result and should not be used for non-medical purposes. Clinical consideration and professional judgment should be applied to any positive drug screen result due to possible interfering substances. A more specific alternate chemical method must be used in order to obtain a confirmed analytical result. Gas chromatography / mass spectrometry (GC/MS) is the preferred confirm atory method. Performed at [facility], [address] [place], [place] [address]   Resp panel by RT-PCR (RSV, Flu A&B, Covid)  Urine, Clean Catch     Status: None   Collection Time: 11/06/22  9:31 PM   Specimen: Urine, Clean Catch; Nasal Swab  Result Value Ref Range   SARS Coronavirus 2 by RT PCR NEGATIVE NEGATIVE    Comment: (NOTE) SARS-CoV-2 target nucleic acids are NOT DETECTED.  The SARS-CoV-2  RNA is generally detectable in upper respiratory specimens during the acute phase of infection. The lowest concentration of SARS-CoV-2 viral copies this assay can detect is 138 copies/mL. A negative result does not preclude SARS-Cov-2 infection and should not be used as the sole basis for treatment or other patient management decisions. A negative result may occur with  improper specimen collection/handling, submission of specimen other than nasopharyngeal swab, presence of viral mutation(s) within the areas targeted by this assay, and inadequate number of viral copies(<138 copies/mL). A negative result must be combined with clinical observations, patient history, and epidemiological information. The expected result is Negative.  Fact Sheet for Patients:  EntrepreneurPulse.com.au  Fact Sheet for Healthcare Providers:  IncredibleEmployment.be  This test is no t yet approved or cleared by the [place] FDA and  has been authorized for detection and/or diagnosis of SARS-CoV-2 by FDA under an Emergency Use Authorization (EUA). This EUA will remain  in effect (meaning this test can be used) for the duration of the COVID-19 declaration under Section 564(b)(1) of the Act, 21 U.S.C.section 360bbb-3(b)(1), unless the authorization is terminated  or revoked sooner.       Influenza A by PCR NEGATIVE NEGATIVE   Influenza B by PCR NEGATIVE NEGATIVE    Comment: (NOTE) The Xpert Xpress SARS-CoV-2/FLU/RSV plus assay is intended as an aid in the diagnosis of influenza from Nasopharyngeal swab specimens and should not be used as a sole basis for treatment. Nasal washings and aspirates are unacceptable for Xpert Xpress SARS-CoV-2/FLU/RSV testing.  Fact Sheet for Patients: EntrepreneurPulse.com.au  Fact Sheet for Healthcare Providers: IncredibleEmployment.be  This test is not yet approved or cleared by the [place]  FDA and has been authorized for detection and/or diagnosis of SARS-CoV-2 by FDA under an Emergency Use Authorization (EUA). This EUA will remain in effect (meaning this test can be used) for the duration of the COVID-19 declaration under Section 564(b)(1) of the Act, 21 U.S.C. section 360bbb-3(b)(1), unless the authorization is terminated or revoked.     Resp Syncytial Virus by PCR NEGATIVE NEGATIVE    Comment: (NOTE) Fact Sheet for Patients: EntrepreneurPulse.com.au  Fact Sheet for Healthcare Providers: IncredibleEmployment.be  This test is not yet approved or cleared by the [place] FDA and has been authorized for detection and/or diagnosis of SARS-CoV-2 by FDA under an Emergency Use Authorization (EUA). This EUA will remain in effect (meaning this test can be used) for the duration of the COVID-19 declaration under Section 564(b)(1) of the Act, 21 U.S.C. section 360bbb-3(b)(1), unless the authorization is terminated or revoked.  Performed at [facility], [address] [place], [place] [address]   Lipase, blood     Status: Abnormal   Collection Time: 11/06/22 11:40 PM  Result Value Ref Range   Lipase 55 (H) 11 - 51 U/L    Comment: Performed at [facility], [address] [place], [place] [address]  Protime-INR     Status: None   Collection Time: 11/06/22 11:40 PM  Result Value Ref Range   Prothrombin Time 13.3 11.4 - 15.2 seconds   INR 1.0 0.8 - 1.2    Comment: (NOTE) INR goal varies based on  device and disease states. Performed at [facility], [address] [place],  [address]     Current Facility-Administered Medications  Medication Dose Route Frequency Provider Last Rate Last Admin   lidocaine (LIDODERM) 5 % 1 patch  1 patch Transdermal Once Vanessa [place], MD   1 patch at 11/07/22 0200   Current Outpatient Medications  Medication Sig Dispense Refill   amLODipine (NORVASC) 5 MG  tablet Take 1 tablet (5 mg total) by mouth daily. 30 tablet 1   apixaban (ELIQUIS) 5 MG TABS tablet Take 1 tablet (5 mg total) by mouth 2 (two) times daily. 60 tablet 1   ondansetron (ZOFRAN-ODT) 4 MG disintegrating tablet Take 1 tablet (4 mg total) by mouth every 8 (eight) hours as needed for nausea or vomiting. (Patient not taking: Reported on 10/30/2022) 20 tablet 0    Musculoskeletal: Strength & Muscle Tone: within normal limits Gait & Station: normal Patient leans: N/A Psychiatric Specialty Exam:  Presentation  General Appearance:  Bizarre  Eye Contact: Good  Speech: Clear and Coherent  Speech Volume: Decreased  Handedness: Right   Mood and Affect  Mood: Anxious  Affect: Blunt; Depressed   Thought Process  Thought Processes: Coherent  Descriptions of Associations:Intact  Orientation:Full (Time, Place and Person)  Thought Content:Logical  History of Schizophrenia/Schizoaffective disorder:No  Duration of Psychotic Symptoms:No data recorded Hallucinations:No data recorded Ideas of Reference:None  Suicidal Thoughts:No data recorded Homicidal Thoughts:No data recorded  Sensorium  Memory: Immediate Good; Recent Good; Remote Good  Judgment: Poor  Insight: Poor   Executive Functions  Concentration: Fair  Attention Span: Fair  Recall: [employer] of Knowledge: Fair  Language: Fair   Psychomotor Activity  Psychomotor Activity:No data recorded  Assets  Assets: Communication Skills; Resilience; Social Support   Sleep  Sleep:No data recorded  Physical Exam: Physical Exam Vitals and nursing note reviewed.  Constitutional:      Appearance: Normal appearance. He is normal weight.  HENT:     Head: Normocephalic and atraumatic.     Right Ear: External ear normal.     Left Ear: External ear normal.     Nose: Nose normal.     Mouth/Throat:     Mouth: Mucous membranes are dry.  Cardiovascular:     Rate and Rhythm: Normal rate.      Pulses: Normal pulses.  Pulmonary:     Effort: Pulmonary effort is normal.  Musculoskeletal:        General: Normal range of motion.     Cervical back: Normal range of motion and neck supple.  Skin:    General: Skin is warm.  Neurological:     General: No focal deficit present.     Mental Status: He is alert and oriented to person, place, and time.  Psychiatric:        Attention and Perception: Attention and perception normal.        Mood and Affect: Mood is depressed. Affect is blunt.        Speech: Speech is slurred.        Behavior: Behavior normal. Behavior is cooperative.        Thought Content: Thought content includes suicidal ideation.        Cognition and Memory: Cognition and memory normal.        Judgment: Judgment is inappropriate.    Review of Systems  Psychiatric/Behavioral:  Positive for depression, substance abuse and suicidal ideas.   All other systems reviewed and are negative.  Blood pressure 135/87, pulse 85, temperature 98.4 F (36.9 C), temperature source Oral, resp. rate 20, height 5' 8"$  (1.727 m), weight 68 kg, SpO2 99 %. Body mass index is 22.81 kg/m.  Treatment Plan Summary: Plan   Discussed with the EDP that the patient may remain in the ED to metabolize illicit substances, and he can be discharged in the morning considering he remains mentally stable.   Disposition: No evidence of imminent risk to self or others at present.   Patient does not meet criteria for psychiatric inpatient admission. Supportive therapy provided about ongoing stressors. Refer to IOP. Discussed crisis plan, support from social network, calling 911, coming to the Emergency Department, and calling Suicide Hotline.  Caroline Sauger, NP 11/07/2022 2:33 AM

## 2022-11-07 NOTE — Discharge Instructions (Signed)
You have evidence of irritation of your liver from your alcohol drinking.  If this continues you can develop cirrhosis.  It is important that you try to cut down on your drinking.  Do not stop drinking all all at once as this can lead to severe withdrawal, seizures, death   IMPRESSION: 1. Hepatic steatosis. 2. Aortic atherosclerosis.

## 2022-11-07 NOTE — ED Notes (Signed)
Pt eating dinner tray.    Dr Jari Pigg with pt again for eval.

## 2022-11-07 NOTE — ED Notes (Signed)
Report from amy, rn
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
## 2022-11-10 ENCOUNTER — Other Ambulatory Visit: Payer: Self-pay

## 2022-11-10 ENCOUNTER — Emergency Department
Admission: EM | Admit: 2022-11-10 | Discharge: 2022-11-11 | Disposition: A | Discharge status: Home / Self Care | Payer: Medicaid Other | Attending: Emergency Medicine | Admitting: Emergency Medicine

## 2022-11-10 DIAGNOSIS — R1084 Generalized abdominal pain: Secondary | ICD-10-CM | POA: Diagnosis present

## 2022-11-10 DIAGNOSIS — G894 Chronic pain syndrome: Secondary | ICD-10-CM | POA: Insufficient documentation

## 2022-11-10 LAB — CBC
HCT: 41 % (ref 39.0–52.0)
Hemoglobin: 13.6 g/dL (ref 13.0–17.0)
MCH: 32.3 pg (ref 26.0–34.0)
MCHC: 33.2 g/dL (ref 30.0–36.0)
MCV: 97.4 fL (ref 80.0–100.0)
Platelets: 142 10*3/uL — ABNORMAL LOW (ref 150–400)
RBC: 4.21 MIL/uL — ABNORMAL LOW (ref 4.22–5.81)
RDW: 12.2 % (ref 11.5–15.5)
WBC: 6.4 10*3/uL (ref 4.0–10.5)
nRBC: 0 % (ref 0.0–0.2)

## 2022-11-10 LAB — COMPREHENSIVE METABOLIC PANEL
ALT: 212 U/L — ABNORMAL HIGH (ref 0–44)
AST: 271 U/L — ABNORMAL HIGH (ref 15–41)
Albumin: 3.4 g/dL — ABNORMAL LOW (ref 3.5–5.0)
Alkaline Phosphatase: 84 U/L (ref 38–126)
Anion gap: 9 (ref 5–15)
BUN: 13 mg/dL (ref 6–20)
CO2: 24 mmol/L (ref 22–32)
Calcium: 8.5 mg/dL — ABNORMAL LOW (ref 8.9–10.3)
Chloride: 107 mmol/L (ref 98–111)
Creatinine, Ser: 0.72 mg/dL (ref 0.61–1.24)
GFR, Estimated: >60 mL/min (ref >60)
Glucose, Bld: 103 mg/dL — ABNORMAL HIGH (ref 70–99)
Potassium: 3.5 mmol/L (ref 3.5–5.1)
Sodium: 140 mmol/L (ref 135–145)
Total Bilirubin: 0.6 mg/dL (ref 0.3–1.2)
Total Protein: 6.8 g/dL (ref 6.5–8.1)

## 2022-11-10 LAB — URINALYSIS, ROUTINE W REFLEX MICROSCOPIC
Bilirubin Urine: NEGATIVE
Glucose, UA: NEGATIVE mg/dL
Hgb urine dipstick: NEGATIVE
Ketones, ur: NEGATIVE mg/dL
Leukocytes,Ua: NEGATIVE
Nitrite: NEGATIVE
Protein, ur: NEGATIVE mg/dL
Specific Gravity, Urine: 1.005 (ref 1.005–1.030)
pH: 5 (ref 5.0–8.0)

## 2022-11-10 LAB — LIPASE, BLOOD: Lipase: 51 U/L (ref 11–51)

## 2022-11-10 MED ORDER — LIDOCAINE 5 % EX PTCH: 1.0000 | MEDICATED_PATCH | Freq: Two times a day (BID) | CUTANEOUS | 0 refills | Status: AC

## 2022-11-10 MED ORDER — LIDOCAINE 5 % EX PTCH
1.0000 | MEDICATED_PATCH | CUTANEOUS | Status: DC
  Administered 2022-11-10: 1 via TRANSDERMAL
  Filled 2022-11-10: qty 1

## 2022-11-10 NOTE — ED Provider Notes (Signed)
[facility] Provider Note    Event Date/Time   First MD Initiated Contact with Patient 11/10/22 2256     (approximate)   History   Abdominal Pain and Psychiatric Evaluation   HPI  Reynold Schlag is a 62 y.o. male who presents to the ED for evaluation of Abdominal Pain and Psychiatric Evaluation   I am familiar with this patient.  History of alcoholism, homelessness, frequent ED use and chronic pain syndrome.  Seen recently, 4 days ago, being cleared medically and seen by psychiatry also cleared him for outpatient care.  Patient presents to the ED via EMS from the streets for evaluation of chronic pain.  He reports that he was walking this evening, but it was getting cold and he was getting tired, so he stopped at a boardinghouse and asked them to call 911 for him because he felt like he could not go any further.  He reports years of pain to his head and neck, primarily left-sided neck pain when he twists his head to the left.  No falls or injuries recently. Also reporting a few weeks of generalized abdominal discomfort, worse with his daily ethanol intake.  He denies any fevers, falls or injuries, syncopal episodes or assaults.  No suicidal thoughts.  He is agreeable to a meal tray and spending the night in our lobby so he can stay warm.   Physical Exam   Triage Vital Signs: ED Triage Vitals  Enc Vitals Group     BP 11/10/22 2254 (!) 162/96     Pulse Rate 11/10/22 2254 86     Resp 11/10/22 2254 17     Temp 11/10/22 2254 97.9 F (36.6 C)     Temp Source 11/10/22 2254 Oral     SpO2 11/10/22 2254 99 %     Weight 11/10/22 2255 150 lb (68 kg)     Height 11/10/22 2255 5' 8"$  (1.727 m)     Head Circumference --      Peak Flow --      Pain Score 11/10/22 2255 10     Pain Loc --      Pain Edu? --      Excl. in [place]? --     Most recent vital signs: Vitals:   11/10/22 2254  BP: (!) 162/96  Pulse: 86  Resp: 17  Temp: 97.9 F (36.6 C)  SpO2:  99%    General: Awake, no distress.  CV:  Good peripheral perfusion.  Resp:  Normal effort.  Abd:  Minimal thin, no tenderness throughout. MSK:  No deformity noted.  Venous stasis dermatitis symmetrically to the bilateral lower extremities. Neuro:  No focal deficits appreciated. Other:     ED Results / Procedures / Treatments   Labs (all labs ordered are listed, but only abnormal results are displayed) Labs Reviewed  COMPREHENSIVE METABOLIC PANEL - Abnormal; Notable for the following components:      Result Value   Glucose, Bld 103 (*)    Calcium 8.5 (*)    Albumin 3.4 (*)    AST 271 (*)    ALT 212 (*)    All other components within normal limits  CBC - Abnormal; Notable for the following components:   RBC 4.21 (*)    Platelets 142 (*)    All other components within normal limits  URINALYSIS, ROUTINE W REFLEX MICROSCOPIC - Abnormal; Notable for the following components:   Color, Urine STRAW (*)    APPearance CLEAR (*)  All other components within normal limits  LIPASE, BLOOD    EKG Sinus rhythm with a rate of 82 bpm.  Normal axis.  Right bundle.  No clear signs of acute ischemia.  RADIOLOGY   Official radiology report(s): No results found.  PROCEDURES and INTERVENTIONS:  Procedures  Medications  lidocaine (LIDODERM) 5 % 1 patch (1 patch Transdermal Patch Applied 11/10/22 2346)     IMPRESSION / MDM / ASSESSMENT AND PLAN / ED COURSE  I reviewed the triage vital signs and the nursing notes.  Differential diagnosis includes, but is not limited to, malingering, alcoholic hepatitis, alcoholic ketoacidosis, cellulitis, SBP, sepsis, DVT, mesenteric ischemia  {Patient presents with symptoms of an acute illness or injury that is potentially life-threatening.  Pleasant 62 year old male presents to the ED with various chronic complaints suitable for continued outpatient management.  He look systemically well to me.  Venous stasis dermatitis without stigmata of  overlying cellulitis.  No signs of sepsis or infectious pathology on his blood work.  Metabolic panel does have chronic LFT elevations consistent with previous readings.  Doubt SBP as his abdomen is not tender.  Normal lipase and urinalysis.  Will provide a lidocaine patch to the chronic pain of his left-sided paraspinal neck and a meal tray.  We discussed appropriate return precautions.      FINAL CLINICAL IMPRESSION(S) / ED DIAGNOSES   Final diagnoses:  Chronic pain syndrome     Rx / DC Orders   ED Discharge Orders          Ordered    lidocaine (LIDODERM) 5 %  Every 12 hours        11/10/22 2354             Note:  This document was prepared using Dragon voice recognition software and may include unintentional dictation errors.   Vladimir Crofts, MD 11/10/22 [phone]

## 2022-11-10 NOTE — ED Notes (Signed)
Dr. Smith at bedside.

## 2022-11-10 NOTE — ED Notes (Addendum)
Pt reports he has had thoughts of harming himself. Pt denies any thoughts of harming himself at present. Reports he will inform nurse if he develops any SI. Pt denies any HI denies visual or auditory hallucinations.

## 2022-11-10 NOTE — Discharge Instructions (Signed)
Please use lidocaine patches at your site of pain.  Apply 1 patch at a time, leave on for 12 hours, then remove for 12 hours.  12 hours on, 12 hours off.  Do not apply more than 1 patch at a time.

## 2022-11-10 NOTE — ED Triage Notes (Signed)
Pt presents to ER via EMS from a boarding home pt is homeless) with generalized abdominal pain. Per EMS pt was seen in ER about a week ago and diagnosed with cirrhosis. Pt has been drinking alcohol repots he drinks everyday beer"as much as I can get" Pt has abdominal distention, redness to lower extremities. EMS administered 12m zofran IV. 98.96F, 88,97%, 158/88 CBG 146

## 2022-11-11 IMAGING — CR DG CHEST 2V
1 series · 2 of 2 positions shown · non-contrast
Comparison: 11/11/2021, chest CT 10/24/2021

CLINICAL DATA: Chest pain and shortness of breath.

EXAM:
CHEST - 2 VIEW

[Series 1: dg chest 2 view · 0.14mm/px · 2 of 2 slices shown]
[im 1/2]
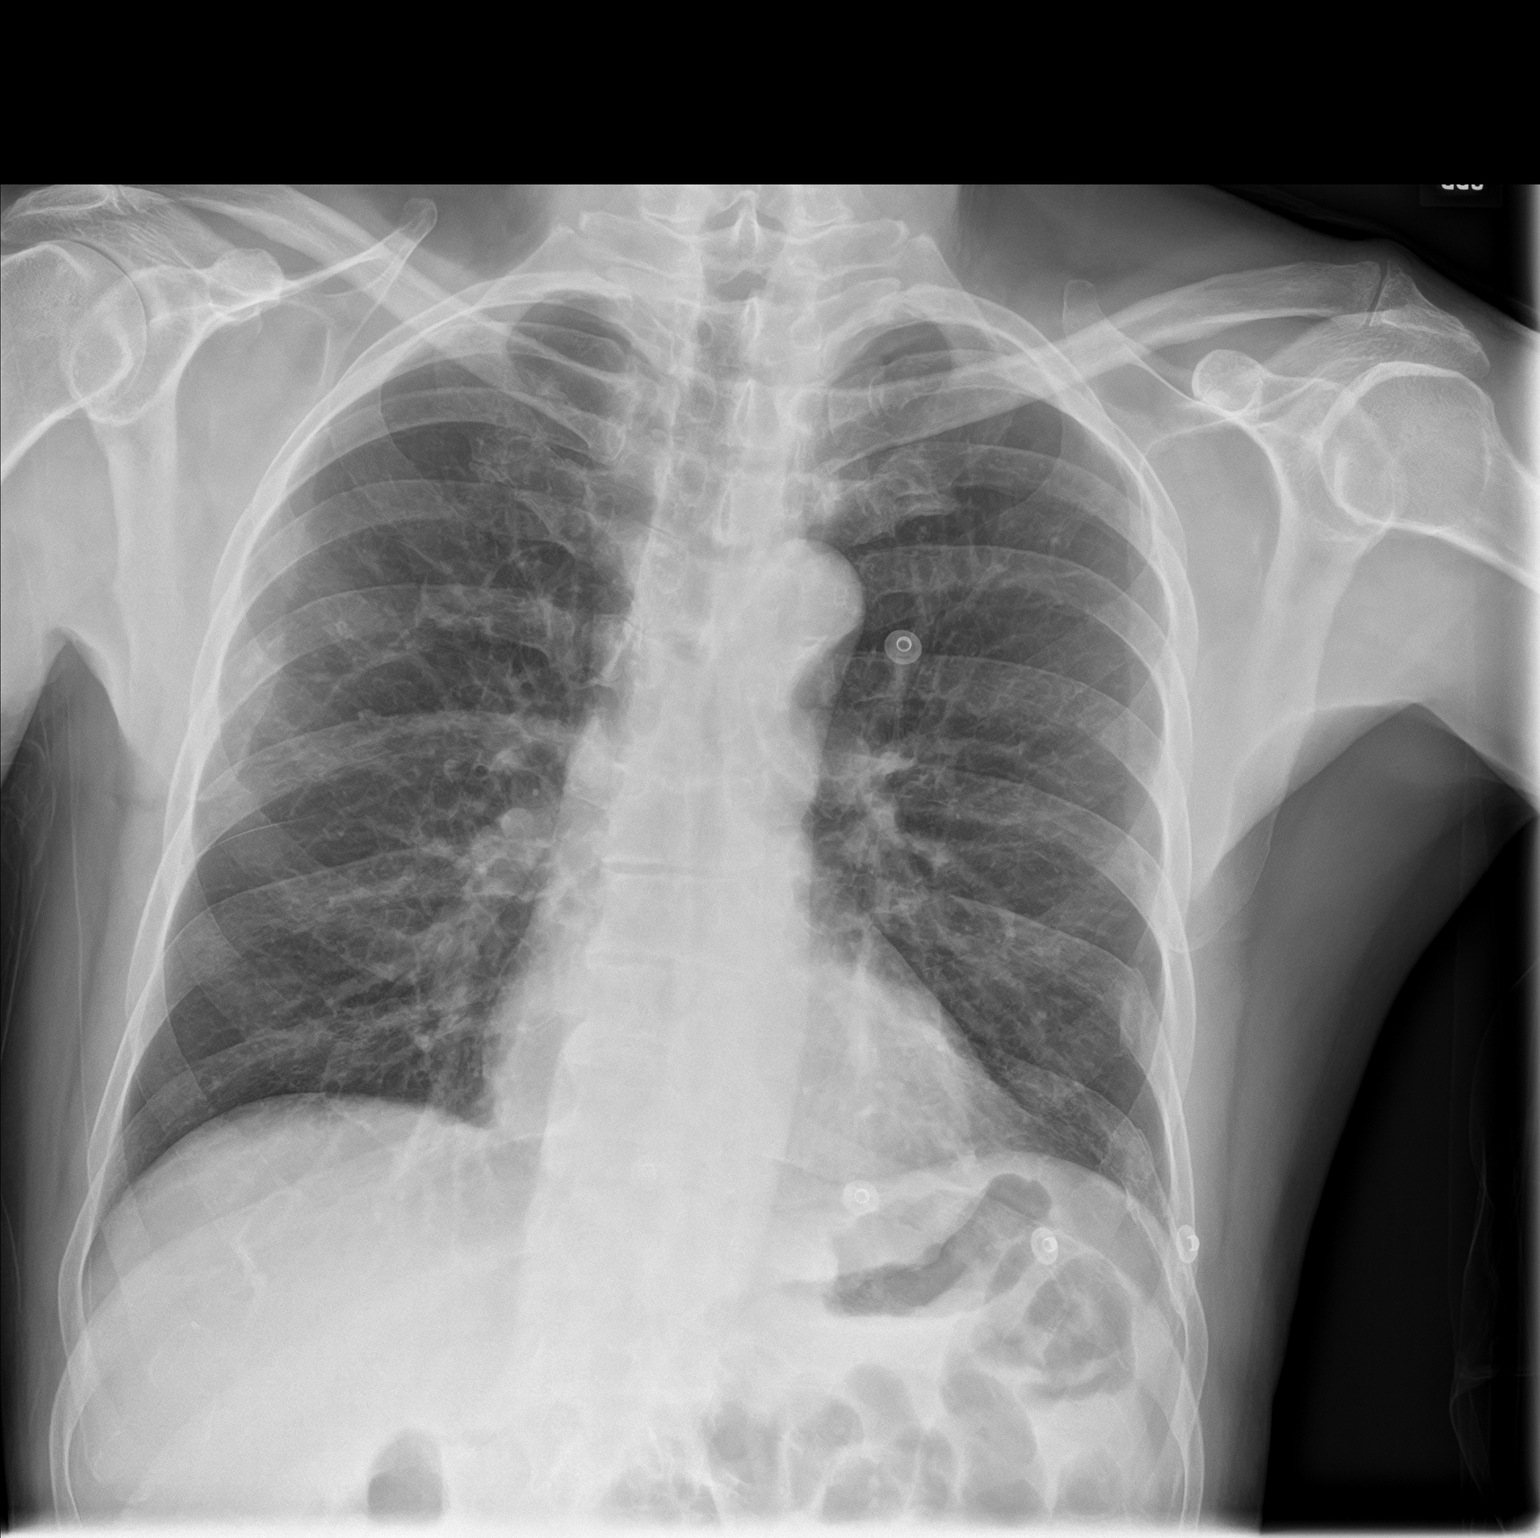
[im 2/2]
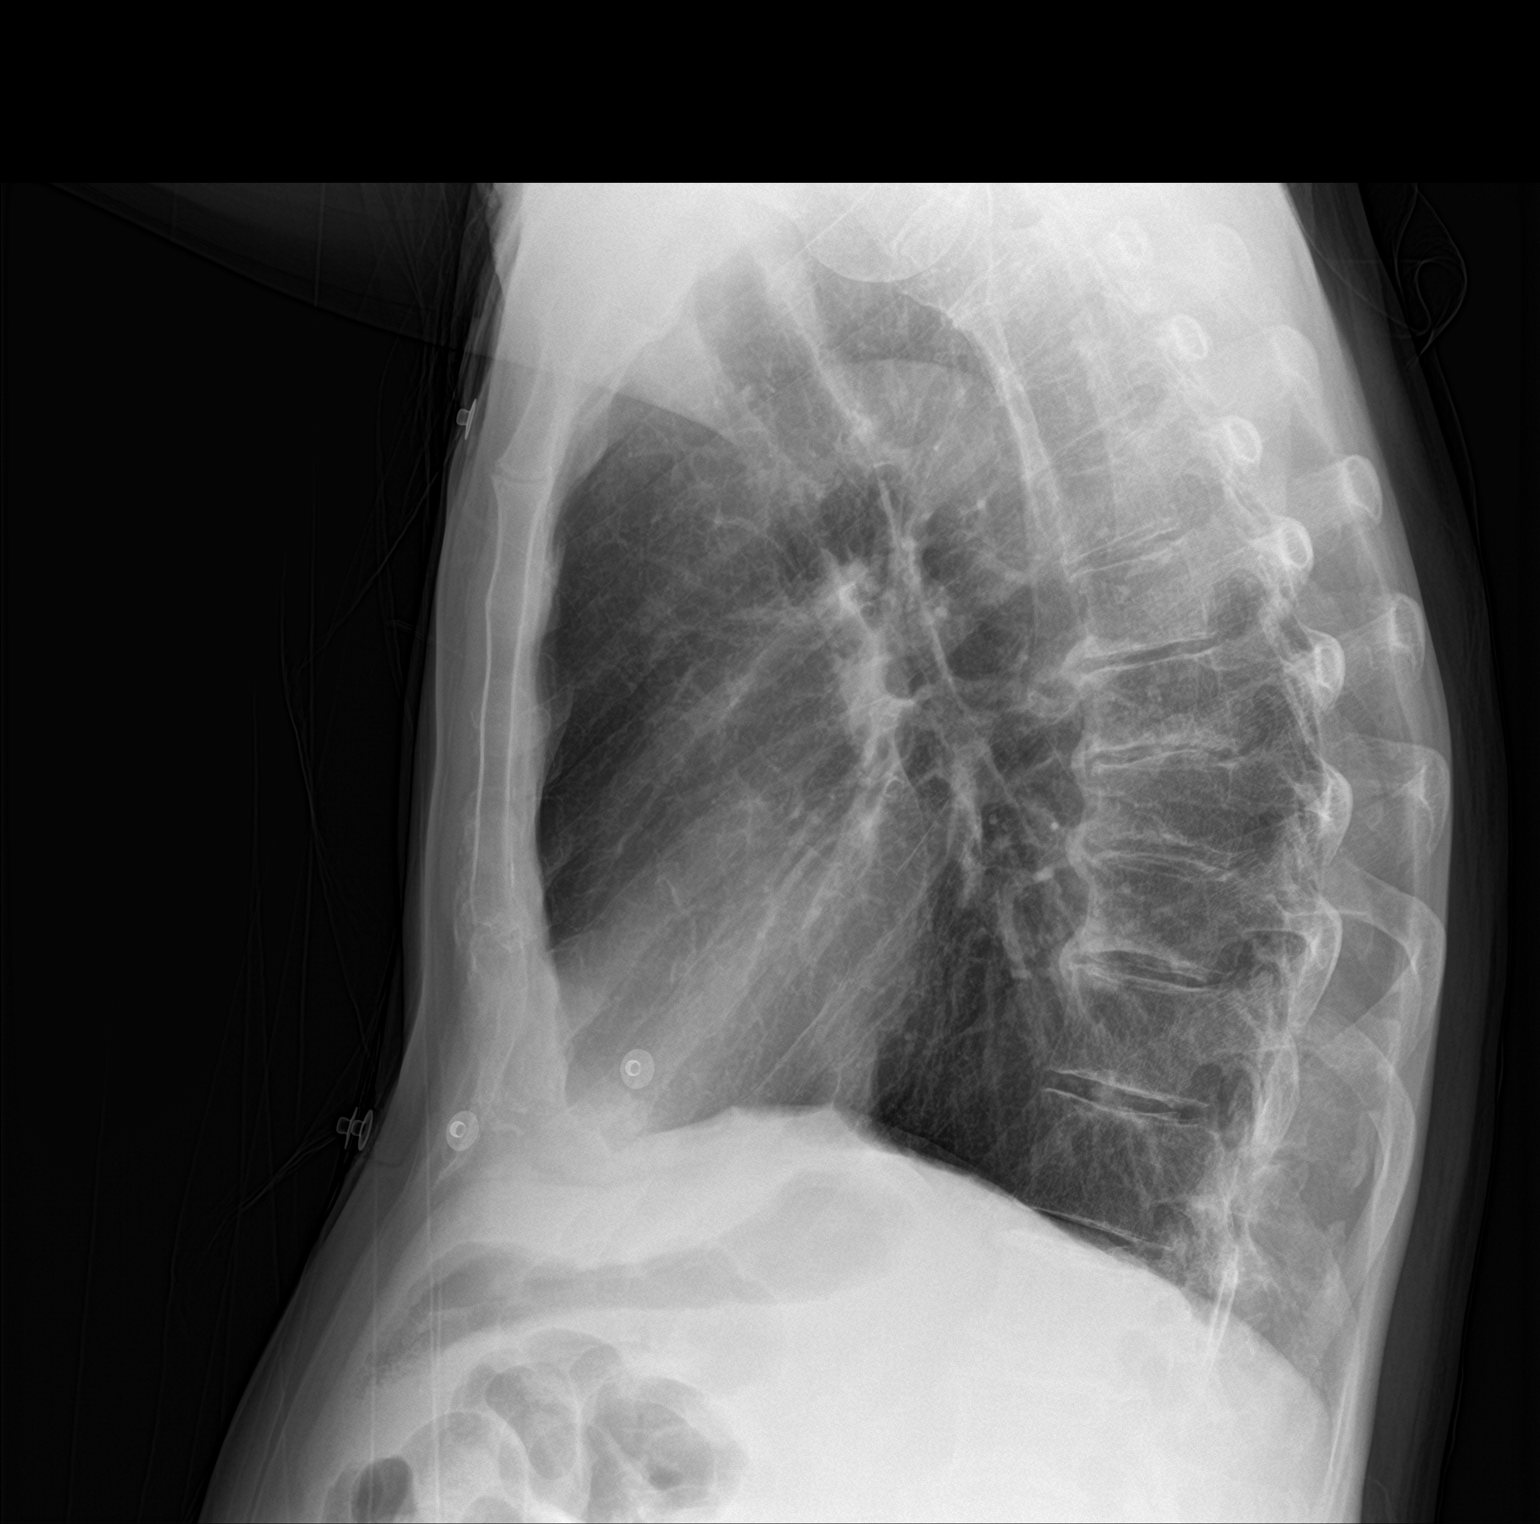

[2 of 2 positions shown; findings below may reference images not displayed]

FINDINGS: The cardiomediastinal contours are normal. The lungs are clear.
Pulmonary vasculature is normal. No consolidation, pleural effusion,
or pneumothorax. Remote left rib fractures. No acute osseous
abnormalities are seen.
IMPRESSION: No acute chest findings. Stable radiographic appearance of the
chest.

## 2022-11-20 IMAGING — CR DG CHEST 2V
1 series · 2 of 2 positions shown · non-contrast
Comparison: Chest x-ray 11/25/2021, CT chest 10/24/2021

CLINICAL DATA: Cough/CP

EXAM:
CHEST - 2 VIEW

[Series 1: w chest pa · 0.14mm/px · 2 of 2 slices shown]
[im 1/2]
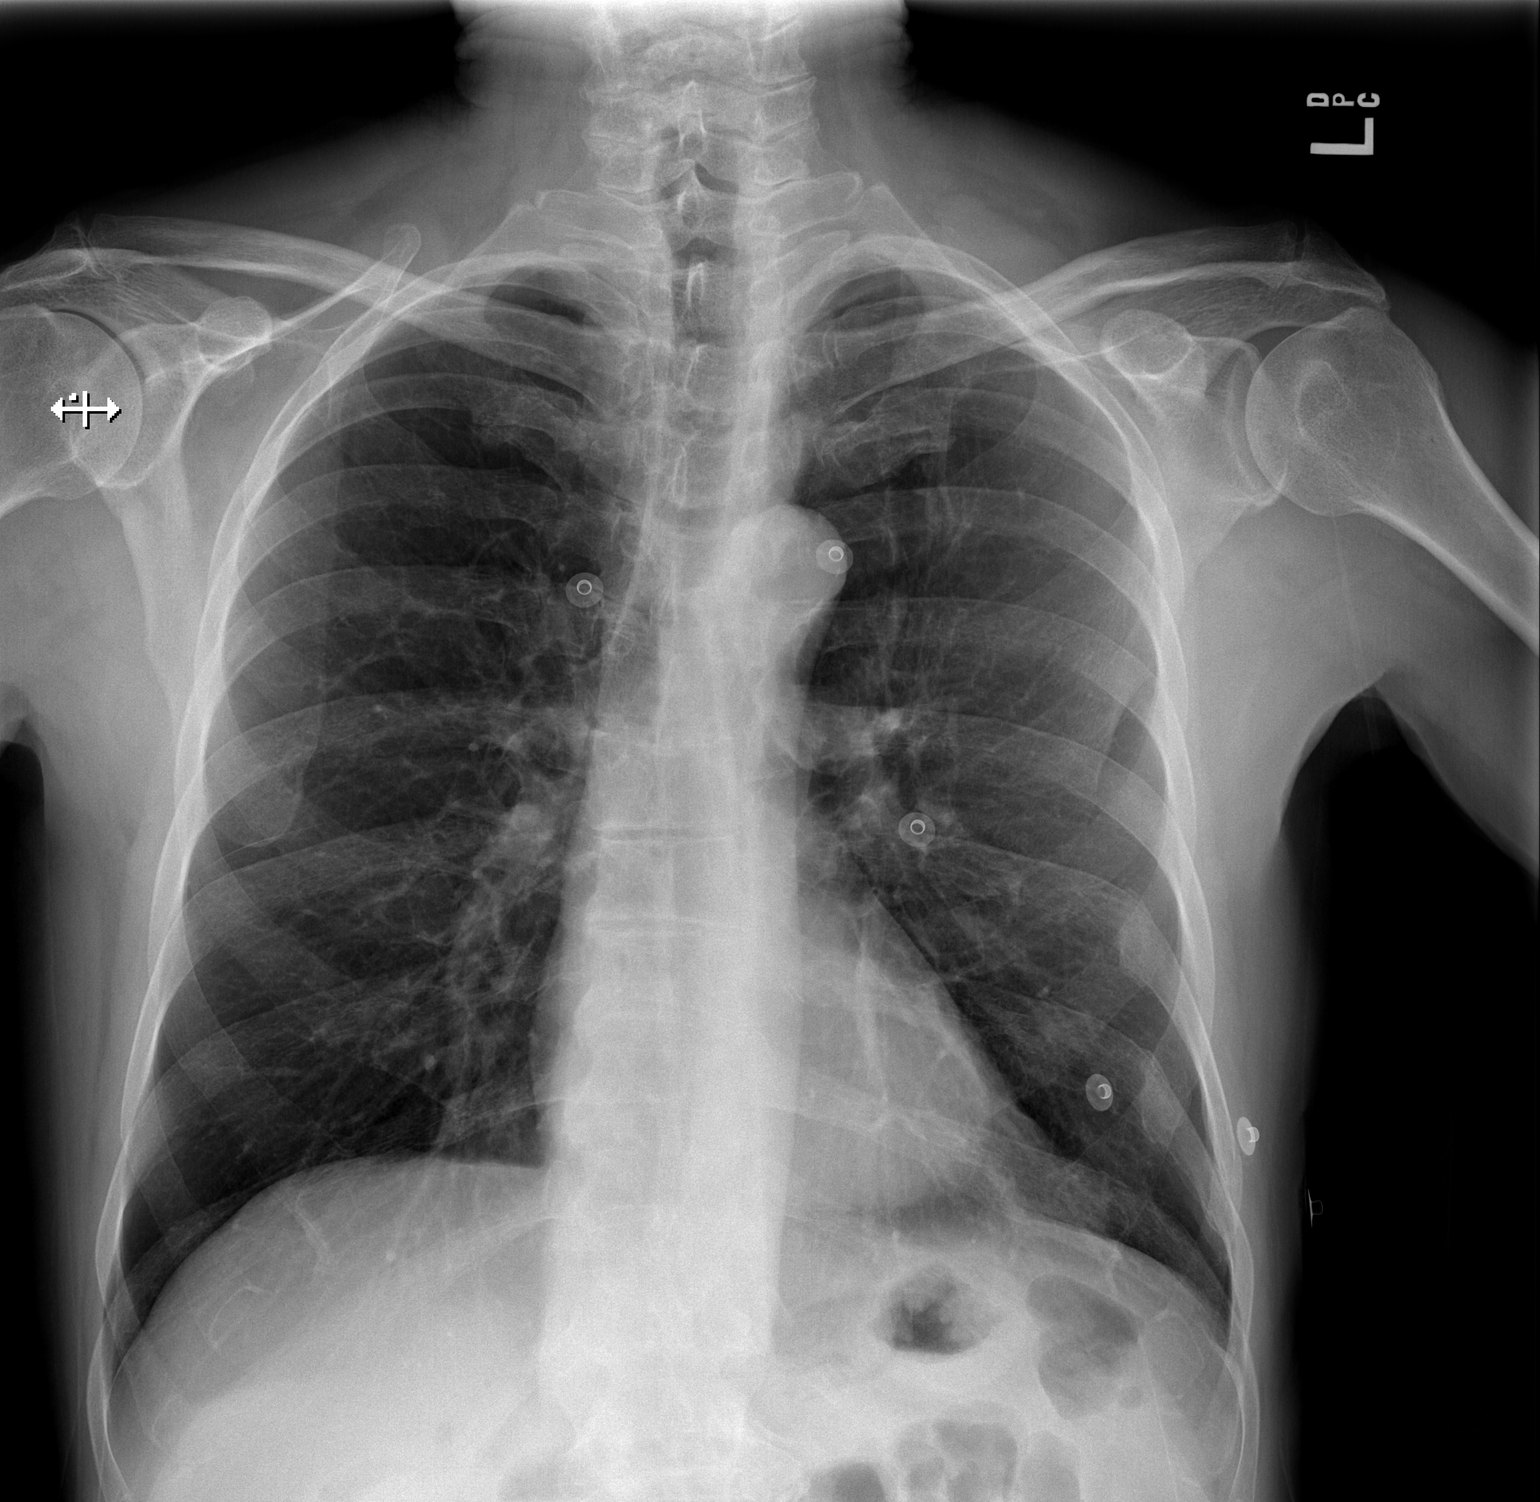
[im 2/2]
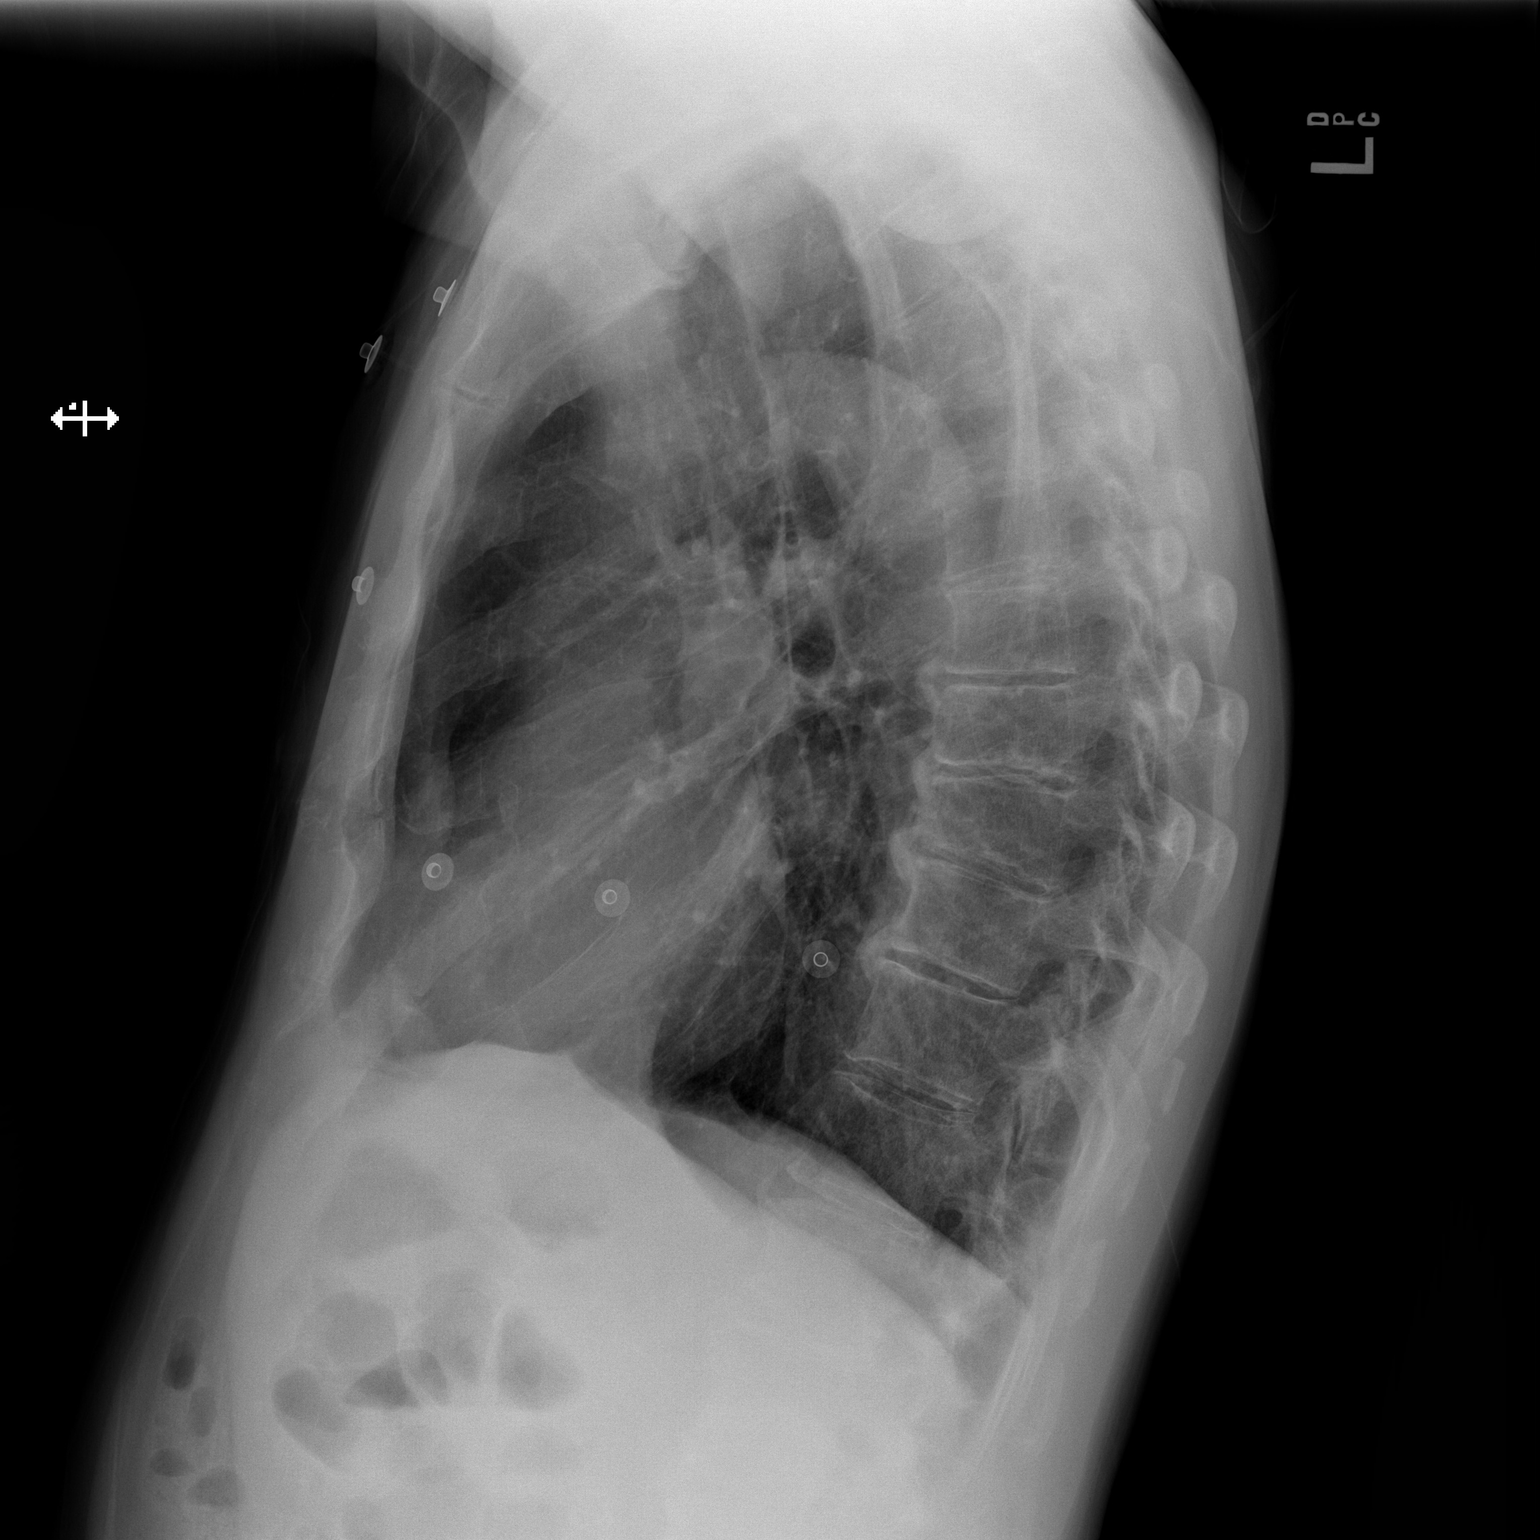

[2 of 2 positions shown; findings below may reference images not displayed]

FINDINGS: The heart and mediastinal contours are unchanged. Aortic
calcification.

Known biapical emphysematous changes poorly visualized. No focal
consolidation. No pulmonary edema. No pleural effusion. No
pneumothorax.

No acute osseous abnormality.
IMPRESSION: No active cardiopulmonary disease.

Aortic Atherosclerosis (2YW1L-BFB.B) and Emphysema (2YW1L-GKE.6).
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
## 2022-12-03 ENCOUNTER — Encounter: Payer: Self-pay | Admitting: *Deleted

## 2022-12-03 ENCOUNTER — Other Ambulatory Visit: Payer: Self-pay

## 2022-12-03 ENCOUNTER — Emergency Department
Admission: EM | Admit: 2022-12-03 | Discharge: 2022-12-04 | Disposition: A | Discharge status: Home / Self Care | Payer: No Typology Code available for payment source | Attending: Emergency Medicine | Admitting: Emergency Medicine

## 2022-12-03 DIAGNOSIS — F1090 Alcohol use, unspecified, uncomplicated: Secondary | ICD-10-CM

## 2022-12-03 DIAGNOSIS — R7401 Elevation of levels of liver transaminase levels: Secondary | ICD-10-CM | POA: Diagnosis not present

## 2022-12-03 DIAGNOSIS — Y907 Blood alcohol level of 200-239 mg/100 ml: Secondary | ICD-10-CM | POA: Insufficient documentation

## 2022-12-03 DIAGNOSIS — F1020 Alcohol dependence, uncomplicated: Secondary | ICD-10-CM | POA: Diagnosis not present

## 2022-12-03 DIAGNOSIS — G8929 Other chronic pain: Secondary | ICD-10-CM | POA: Diagnosis not present

## 2022-12-03 DIAGNOSIS — M25561 Pain in right knee: Secondary | ICD-10-CM | POA: Diagnosis not present

## 2022-12-03 DIAGNOSIS — Z5900 Homelessness unspecified: Secondary | ICD-10-CM | POA: Insufficient documentation

## 2022-12-03 DIAGNOSIS — R45851 Suicidal ideations: Secondary | ICD-10-CM | POA: Insufficient documentation

## 2022-12-03 DIAGNOSIS — M25562 Pain in left knee: Secondary | ICD-10-CM | POA: Insufficient documentation

## 2022-12-03 LAB — CBC
HCT: 41.9 % (ref 39.0–52.0)
Hemoglobin: 14 g/dL (ref 13.0–17.0)
MCH: 31.8 pg (ref 26.0–34.0)
MCHC: 33.4 g/dL (ref 30.0–36.0)
MCV: 95.2 fL (ref 80.0–100.0)
Platelets: 124 10*3/uL — ABNORMAL LOW (ref 150–400)
RBC: 4.4 MIL/uL (ref 4.22–5.81)
RDW: 11.9 % (ref 11.5–15.5)
WBC: 7.3 10*3/uL (ref 4.0–10.5)
nRBC: 0 % (ref 0.0–0.2)

## 2022-12-03 LAB — SALICYLATE LEVEL: Salicylate Lvl: <7 mg/dL — ABNORMAL LOW (ref 7.0–30.0)

## 2022-12-03 LAB — COMPREHENSIVE METABOLIC PANEL
ALT: 230 U/L — ABNORMAL HIGH (ref 0–44)
AST: 252 U/L — ABNORMAL HIGH (ref 15–41)
Albumin: 4 g/dL (ref 3.5–5.0)
Alkaline Phosphatase: 85 U/L (ref 38–126)
Anion gap: 9 (ref 5–15)
BUN: 12 mg/dL (ref 6–20)
CO2: 25 mmol/L (ref 22–32)
Calcium: 9.3 mg/dL (ref 8.9–10.3)
Chloride: 100 mmol/L (ref 98–111)
Creatinine, Ser: 0.69 mg/dL (ref 0.61–1.24)
GFR, Estimated: >60 mL/min (ref >60)
Glucose, Bld: 128 mg/dL — ABNORMAL HIGH (ref 70–99)
Potassium: 3.5 mmol/L (ref 3.5–5.1)
Sodium: 134 mmol/L — ABNORMAL LOW (ref 135–145)
Total Bilirubin: 0.8 mg/dL (ref 0.3–1.2)
Total Protein: 8.1 g/dL (ref 6.5–8.1)

## 2022-12-03 LAB — ETHANOL: Alcohol, Ethyl (B): 224 mg/dL — ABNORMAL HIGH (ref <10)

## 2022-12-03 LAB — URINE DRUG SCREEN, QUALITATIVE (ARMC ONLY)
Amphetamines, Ur Screen: NOT DETECTED
Barbiturates, Ur Screen: NOT DETECTED
Benzodiazepine, Ur Scrn: NOT DETECTED
Cannabinoid 50 Ng, Ur ~~LOC~~: NOT DETECTED
Cocaine Metabolite,Ur ~~LOC~~: POSITIVE — AB
MDMA (Ecstasy)Ur Screen: NOT DETECTED
Methadone Scn, Ur: NOT DETECTED
Opiate, Ur Screen: NOT DETECTED
Phencyclidine (PCP) Ur S: NOT DETECTED
Tricyclic, Ur Screen: NOT DETECTED

## 2022-12-03 LAB — ACETAMINOPHEN LEVEL: Acetaminophen (Tylenol), Serum: <10 ug/mL — ABNORMAL LOW (ref 10–30)

## 2022-12-03 MED ORDER — LORAZEPAM 2 MG PO TABS: 0.0000 mg | ORAL_TABLET | Freq: Two times a day (BID) | ORAL | Status: DC

## 2022-12-03 MED ORDER — ALUM & MAG HYDROXIDE-SIMETH 200-200-20 MG/5ML PO SUSP: 30.0000 mL | Freq: Four times a day (QID) | ORAL | Status: DC | PRN

## 2022-12-03 MED ORDER — LORAZEPAM 2 MG/ML IJ SOLN: 0.0000 mg | Freq: Two times a day (BID) | INTRAMUSCULAR | Status: DC

## 2022-12-03 MED ORDER — LORAZEPAM 2 MG/ML IJ SOLN: 0.0000 mg | Freq: Four times a day (QID) | INTRAMUSCULAR | Status: DC

## 2022-12-03 MED ORDER — IBUPROFEN 600 MG PO TABS
600.0000 mg | ORAL_TABLET | Freq: Four times a day (QID) | ORAL | Status: DC | PRN
  Administered 2022-12-04: 600 mg via ORAL
  Filled 2022-12-03: qty 1

## 2022-12-03 MED ORDER — THIAMINE HCL 100 MG/ML IJ SOLN: 100.0000 mg | Freq: Every day | INTRAMUSCULAR | Status: DC

## 2022-12-03 MED ORDER — LORAZEPAM 2 MG PO TABS
0.0000 mg | ORAL_TABLET | Freq: Four times a day (QID) | ORAL | Status: DC
  Administered 2022-12-03 – 2022-12-04 (×2): 1 mg via ORAL
  Filled 2022-12-03 (×2): qty 1

## 2022-12-03 MED ORDER — AMLODIPINE BESYLATE 5 MG PO TABS
5.0000 mg | ORAL_TABLET | Freq: Every day | ORAL | Status: DC
  Administered 2022-12-04: 5 mg via ORAL
  Filled 2022-12-03 (×2): qty 1

## 2022-12-03 MED ORDER — DICLOFENAC SODIUM 1 % EX GEL
4.0000 g | Freq: Three times a day (TID) | CUTANEOUS | Status: DC
  Administered 2022-12-03: 4 g via TOPICAL
  Filled 2022-12-03: qty 100

## 2022-12-03 MED ORDER — THIAMINE MONONITRATE 100 MG PO TABS
100.0000 mg | ORAL_TABLET | Freq: Every day | ORAL | Status: DC
  Administered 2022-12-04: 100 mg via ORAL
  Filled 2022-12-03 (×2): qty 1

## 2022-12-03 NOTE — ED Notes (Signed)
Cross necklace [employer] green coat Tan sneakers Tan pants Gray socks Red underwear

## 2022-12-03 NOTE — ED Triage Notes (Signed)
Pt reports SI.  Pt brought in by bpd officer   pt is voluntary  pt reports etoh and drug use.  Pt calm and cooperative.  Pt is homeless per pt.

## 2022-12-03 NOTE — ED Provider Notes (Signed)
[facility] Provider Note    Event Date/Time   First MD Initiated Contact with Patient 12/03/22 2211     (approximate)   History   Psychiatric Evaluation   HPI  Jose [name] is a 62 y.o. male  here with multiple complaints. Pt states that he has been under acute on chronic pain in his b/l kness and neck and is requesting something for this. He says this pain along with the passage of his friend recently after accidental overdose has caused his depression to worsen and he has wanted to kill himself. H/o suicide attempt in the past. Reports he feels unsafe and like he wants to hurt himself and has nothing to live for.       Physical Exam   Triage Vital Signs: ED Triage Vitals  Enc Vitals Group     BP 12/03/22 2152 (!) 132/93     Pulse Rate 12/03/22 2152 93     Resp 12/03/22 2152 18     Temp 12/03/22 2152 98 F (36.7 C)     Temp Source 12/03/22 2152 Oral     SpO2 12/03/22 2152 95 %     Weight 12/03/22 2146 149 lb 14.6 oz (68 kg)     Height 12/03/22 2146 '5\' 8"'$  (1.727 m)     Head Circumference --      Peak Flow --      Pain Score 12/03/22 2145 10     Pain Loc --      Pain Edu? --      Excl. in [place]? --     Most recent vital signs: Vitals:   12/03/22 2152  BP: (!) 132/93  Pulse: 93  Resp: 18  Temp: 98 F (36.7 C)  SpO2: 95%     General: Awake, no distress.  CV:  Good peripheral perfusion.  Resp:  Normal work of breathing.  Abd:  No distention.  Other:  +SI. No AVH. Not responding to internal stimuli. Moderate TTP bilateral knees but no deformity, swelling, warmth.    ED Results / Procedures / Treatments   Labs (all labs ordered are listed, but only abnormal results are displayed) Labs Reviewed  COMPREHENSIVE METABOLIC PANEL - Abnormal; Notable for the following components:      Result Value   Sodium 134 (*)    Glucose, Bld 128 (*)    AST 252 (*)    ALT 230 (*)    All other components within normal limits  ETHANOL -  Abnormal; Notable for the following components:   Alcohol, Ethyl (B) 224 (*)    All other components within normal limits  SALICYLATE LEVEL - Abnormal; Notable for the following components:   Salicylate Lvl [id number] (*)    All other components within normal limits  ACETAMINOPHEN LEVEL - Abnormal; Notable for the following components:   Acetaminophen (Tylenol), Serum <10 (*)    All other components within normal limits  CBC - Abnormal; Notable for the following components:   Platelets 124 (*)    All other components within normal limits  URINE DRUG SCREEN, QUALITATIVE ([facility] ONLY) - Abnormal; Notable for the following components:   Cocaine Metabolite,Ur [place] POSITIVE (*)    All other components within normal limits     EKG    RADIOLOGY    I also independently reviewed and agree with radiologist interpretations.   PROCEDURES:  Critical Care performed: No   MEDICATIONS ORDERED IN ED: Medications  ibuprofen (ADVIL) tablet 600 mg (has  no administration in time range)  diclofenac Sodium (VOLTAREN) 1 % topical gel 4 g (4 g Topical Given 12/03/22 2243)  LORazepam (ATIVAN) injection 0-4 mg (has no administration in time range)    Or  LORazepam (ATIVAN) tablet 0-4 mg (has no administration in time range)  LORazepam (ATIVAN) injection 0-4 mg (has no administration in time range)    Or  LORazepam (ATIVAN) tablet 0-4 mg (has no administration in time range)  thiamine (VITAMIN B1) tablet 100 mg (has no administration in time range)    Or  thiamine (VITAMIN B1) injection 100 mg (has no administration in time range)  alum & mag hydroxide-simeth (MAALOX/MYLANTA) 200-200-20 MG/5ML suspension 30 mL (has no administration in time range)  amLODipine (NORVASC) tablet 5 mg (has no administration in time range)     IMPRESSION / MDM / ASSESSMENT AND PLAN / ED COURSE  I reviewed the triage vital signs and the nursing notes.                              Differential diagnosis includes, but is  not limited to, chronic pain, major depression, depression 2/2 general medical condition, situational anxiety/depression.  Patient's presentation is most consistent with acute presentation with potential threat to life or bodily function.  62 yo M with h/o depression, homelessness, cirrhosis, chronic pain here with SI and chronic pain. No apparent acute injury of his knees and he has no warmth, redness, or signs of infectious or inflammatory arthritis. Will treat with nsaids and voltaren gel. No APAP in setting of his cirrhosis. Otherwise, will consult TTS/Psych. He is voluntary at this time and amenable to evaluation.     FINAL CLINICAL IMPRESSION(S) / ED DIAGNOSES   Final diagnoses:  Other chronic pain  Suicidal ideation     Rx / DC Orders   ED Discharge Orders     None        Note:  This document was prepared using Dragon voice recognition software and may include unintentional dictation errors.   Duffy Bruce, MD 12/03/22 2330

## 2022-12-04 NOTE — ED Notes (Signed)
vol/psych consult ordered/pending.. 

## 2022-12-04 NOTE — BH Assessment (Signed)
Comprehensive Clinical Assessment (CCA) Screening, Triage and Referral Note  12/04/2022 Jose [name] [id number] Recommendations for Services/Supports/Treatments: Pt recommended for follow up Detox, Chemical Dependency Intensive Outpatient Therapy (CDIOP) and/or individual therapy.  Jose [name] is a 62 year old, English speaking, Caucasian male with severe alcohol use disorder. Pt presented to [facility] ED voluntarily due to having thoughts of SI. Per patient report, his main stressors are having lost his friend a couple of weeks ago to an overdose, having unstable housing, and generally having a lot on his mind. Pt endorsed having depression that drives his reliance on alcohol and crack. Pt does not receive mental health services. Pt had fair insight and understands his need to not drink. Pt had normal speech and his thoughts were relevant. Pt was cooperative throughout the assessment. Pt presented with a dysphoric mood and a congruent affect. Pt had an unremarkable appearance. Pt's BAL was 224 on arrival; UDS + for cocaine.  Chief Complaint:  Chief Complaint  Patient presents with   Psychiatric Evaluation   Visit Diagnosis: Alcohol use disorder, severe dependence  Patient Reported Information How did you hear about [place]? Self  What Is the Reason for Your Visit/Call Today? Pt reports SI.  Pt brought in by bpd officer   pt is voluntary  pt reports etoh and drug use.  How Long Has This Been Causing You Problems? > than 6 months  What Do You Feel Would Help You the Most Today? Stress Management   Have You Recently Had Any Thoughts About Hurting Yourself? Yes  Are You Planning to Commit Suicide/Harm Yourself At This time? No   Have you Recently Had Thoughts About [place]? No  Are You Planning to Harm Someone at This Time? No  Explanation: No data recorded  Have You Used Any Alcohol or Drugs in the Past 24 Hours? Yes  How Long Ago Did You Use Drugs or Alcohol? No  data recorded What Did You Use and How Much? Unknown amounts of alcohol and crack   Do You Currently Have a Therapist/Psychiatrist? No  Name of Therapist/Psychiatrist: n/a   Have You Been Recently Discharged From Any Office Practice or Programs? No  Explanation of Discharge From Practice/Program: n/a    CCA Screening Triage Referral Assessment Type of Contact: Face-to-Face  Telemedicine Service Delivery:   Is this Initial or Reassessment?   Date Telepsych consult ordered in [facility]:    Time Telepsych consult ordered in [facility]:    Location of Assessment: [facility] ED  Provider Location: [facility] ED    Collateral Involvement: None provided   Does Patient Have a [place]? No data recorded Name and Contact of Legal Guardian: No data recorded If Minor and Not Living with Parent(s), Who has Custody? n/a  Is CPS involved or ever been involved? Never  Is APS involved or ever been involved? Never   Patient Determined To Be At Risk for Harm To Self or Others Based on Review of Patient Reported Information or Presenting Complaint? No  Method: No Plan  Availability of Means: No access or NA  Intent: Vague intent or NA  Notification Required: No need or identified person  Additional Information for Danger to Others Potential: -- (n/a)  Additional Comments for Danger to Others Potential: n/a  Are There Guns or Other Weapons in Your Home? No  Types of Guns/Weapons: n/a  Are These Weapons Safely Secured?  No  Who Could Verify You Are Able To Have These Secured: n/a  Do You Have any Outstanding Charges, Pending Court Dates, Parole/Probation? n/a  Contacted To Inform of Risk of Harm To Self or Others: Other: Comment   Does Patient Present under Involuntary Commitment? No    [place] of Residence: [place]   Patient Currently Receiving the Following Services: Not Receiving Services   Determination of Need: Urgent (48  hours)   Options For Referral: ED Visit; Therapeutic Triage Services; Chemical Dependency Intensive Outpatient Therapy (CDIOP)   Discharge Disposition:     Alyshia Kernan R Annaleise Burger, LCAS

## 2022-12-04 NOTE — ED Notes (Signed)
Pt given sandwich tray 

## 2022-12-04 NOTE — ED Provider Notes (Signed)
Patient presented last night because of suicidal ideation and because he is homeless.  Psych consult was ordered by provider before me.  I assessed the patient he says he was feeling suicidal before but denies any ongoing thoughts of suicidality.  Did not have plan.  Was drinking this evening drinks daily and has no intent to stop is not interested in rehab at this time.  He is homeless.  But alcohol level was 224.  AST and ALT elevated as well likely in the setting of his alcohol use.  Patient is not demonstrating signs of withdrawal at this time.  Was requesting pain medication earlier was given topical NSAID.  At this point I do not think that he needs psych evaluation.  He is not interested in rehab so is not a good candidate for any benzo taper as an outpatient.  Discussed with patient he will be discharged in the morning.   Rada Hay, MD 12/04/22 [phone]

## 2022-12-04 NOTE — ED Notes (Signed)
States brother will come at 10am to pick him up

## 2022-12-04 NOTE — BH Assessment (Signed)
Writer spoke with the patient to complete an assessment. Patient denies SI/HI and AV/H.  Writer used motivation interviewing and psychoeducation to engage pt in a discussion about the harmful effects of daily alcohol and recreational drug use. Pt verbalized an understanding; however pt declined substance abuse treatment.  Patient does not meet inpatient criteria.  Writer provided pt's nurse with outpatient resources for pt upon discharge.

## 2022-12-04 NOTE — ED Notes (Signed)
vol/pt does not meet inpatient criteria.Marland KitchenMarland Kitchen
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
## 2022-12-11 NOTE — Congregational Nurse Program (Signed)
  Dept: [phone]   Congregational Nurse Program Note  Date of Encounter: 12/11/2022 Client to [facility] day center for support. He had been drinking prior to this visit. Reports feeling "down" but he does not have a plan to harm himself. He states he is "feeling down" due to the unexpected loss of his friend a few weeks ago, his deceased parents birthdays are tomorrow and he is sleeping on a friend's porch. He also reports he has "been drinking for 40+ years" and does not have any intention of stopping. He continues to pan handle nearby for beer and cigarette money.He does at times have people give him food. He is aware of food pantries in the area. He was able to get food at the center today. ctive listening and emotional support given. Ronn Melena BSN, RN Past Medical History: Past Medical History:  Diagnosis Date   Hep C w/o coma, chronic ([place])    Hypertension    Polysubstance abuse ([place])    Pulmonary embolism ([place])     Encounter Details:  CNP Questionnaire - 12/11/22 1130       Questionnaire   Ask client: Do you give verbal consent for me to treat you today? Yes    Student Assistance N/A    Location Patient Jose [name]    Visit Setting with Client Organization    Patient Status Unhoused    Insurance [facility]    Insurance/Financial Assistance Referral N/A    Medication N/A    Medical Provider Yes   client was seen in July, has not rescheduled follow up that was due in October   Screening Referrals Made N/A    Medical Referrals Made N/A    Medical Appointment Made N/A    Recently w/o PCP, now 1st time PCP visit completed due to CNs referral or appointment made N/A    Food Have Food Insecurities   Has food stamps which have been decreased. he pan handles near by for money for beer and snacks   Transportation N/A   walks or has the [employer] bus system   Housing/Utilities No permanent housing   client stays off and on with friends, does at times sleep outdoors on  random porches in the neighborhood   Interpersonal Safety N/A    Interventions Advocate/Support    Abnormal to Normal Screening Since Last CN Visit N/A    Screenings CN Performed Blood Pressure;Pulse Ox    Sent Client to Lab for: N/A    Did client attend any of the following based off CNs referral or appointments made? N/A   client has been getting lunch at a small church on [address] he is aware of the food at the [place]   ED Visit Averted N/A    Life-Saving Intervention Made N/A

## 2022-12-13 NOTE — Congregational Nurse Program (Signed)
  Dept: [phone]   Congregational Nurse Program Note  Date of Encounter: 12/13/2022 Client to [facility] with complaints of right knee pain. He states he "twisted it" yesterday, but does not remember whether he fell. No swelling or bruising noted. He reports pain upon standing and walking. He had taken 800 mg of ibuprofen this morning. RN offered ice or heat, client declined. He has not been to his PCP since past July and declined an MD apt.  His plan was to leave the center to go to "his spot" to [facility] for money/food/beer.  Ronn Melena BSN, RN Past Medical History: Past Medical History:  Diagnosis Date   Hep C w/o coma, chronic ([place])    Hypertension    Polysubstance abuse ([place])    Pulmonary embolism ([place])     Encounter Details:  CNP Questionnaire - 12/13/22 1100       Questionnaire   Ask client: Do you give verbal consent for me to treat you today? Yes    Student Assistance N/A    Location Patient [place]    Visit Setting with Client Organization    Patient Status Unhoused    Insurance [facility]    Insurance/Financial Assistance Referral N/A    Medication N/A    Medical Provider Yes   client was seen in July, has not rescheduled follow up that was due in October   Screening Referrals Made N/A    Medical Referrals Made N/A    Medical Appointment Made N/A    Recently w/o PCP, now 1st time PCP visit completed due to CNs referral or appointment made N/A    Food Have Food Insecurities   Has food stamps which have been decreased. he pan handles near by for money for beer and snacks. Also gets food from friends and is aware of food pantries   Transportation N/A   walks or has the [employer] bus system   Housing/Utilities No permanent housing   client stays off and on with friends, does at times sleep outdoors on random porches in the neighborhood   Interpersonal Safety N/A    Interventions Advocate/Support    Abnormal to Normal Screening Since Last CN Visit  N/A    Screenings CN Performed N/A    Sent Client to Lab for: N/A    Did client attend any of the following based off CNs referral or appointments made? N/A   client has been getting lunch at a small church on [address] he is aware of the food at the [place]   ED Visit Averted N/A    Life-Saving Intervention Made N/A

## 2022-12-16 ENCOUNTER — Other Ambulatory Visit: Payer: Self-pay

## 2022-12-16 ENCOUNTER — Emergency Department
Admission: EM | Admit: 2022-12-16 | Discharge: 2022-12-17 | Disposition: A | Discharge status: Home / Self Care | Payer: No Typology Code available for payment source | Attending: Emergency Medicine | Admitting: Emergency Medicine

## 2022-12-16 DIAGNOSIS — R45851 Suicidal ideations: Secondary | ICD-10-CM | POA: Diagnosis not present

## 2022-12-16 DIAGNOSIS — F1914 Other psychoactive substance abuse with psychoactive substance-induced mood disorder: Secondary | ICD-10-CM | POA: Insufficient documentation

## 2022-12-16 DIAGNOSIS — F1520 Other stimulant dependence, uncomplicated: Secondary | ICD-10-CM | POA: Insufficient documentation

## 2022-12-16 DIAGNOSIS — Z9119 Patient's noncompliance with other medical treatment and regimen due to financial hardship: Secondary | ICD-10-CM

## 2022-12-16 DIAGNOSIS — F1020 Alcohol dependence, uncomplicated: Secondary | ICD-10-CM | POA: Diagnosis not present

## 2022-12-16 DIAGNOSIS — G8929 Other chronic pain: Secondary | ICD-10-CM | POA: Insufficient documentation

## 2022-12-16 DIAGNOSIS — F1590 Other stimulant use, unspecified, uncomplicated: Secondary | ICD-10-CM | POA: Diagnosis not present

## 2022-12-16 DIAGNOSIS — Z5900 Homelessness unspecified: Secondary | ICD-10-CM | POA: Diagnosis not present

## 2022-12-16 DIAGNOSIS — I83819 Varicose veins of unspecified lower extremities with pain: Secondary | ICD-10-CM | POA: Diagnosis not present

## 2022-12-16 DIAGNOSIS — F1994 Other psychoactive substance use, unspecified with psychoactive substance-induced mood disorder: Secondary | ICD-10-CM | POA: Diagnosis present

## 2022-12-16 DIAGNOSIS — F1910 Other psychoactive substance abuse, uncomplicated: Secondary | ICD-10-CM

## 2022-12-16 LAB — URINE DRUG SCREEN, QUALITATIVE (ARMC ONLY)
Amphetamines, Ur Screen: NOT DETECTED
Barbiturates, Ur Screen: NOT DETECTED
Benzodiazepine, Ur Scrn: NOT DETECTED
Cannabinoid 50 Ng, Ur ~~LOC~~: NOT DETECTED
Cocaine Metabolite,Ur ~~LOC~~: POSITIVE — AB
MDMA (Ecstasy)Ur Screen: NOT DETECTED
Methadone Scn, Ur: NOT DETECTED
Opiate, Ur Screen: NOT DETECTED
Phencyclidine (PCP) Ur S: NOT DETECTED
Tricyclic, Ur Screen: NOT DETECTED

## 2022-12-16 LAB — COMPREHENSIVE METABOLIC PANEL
ALT: 267 U/L — ABNORMAL HIGH (ref 0–44)
AST: 390 U/L — ABNORMAL HIGH (ref 15–41)
Albumin: 4 g/dL (ref 3.5–5.0)
Alkaline Phosphatase: 91 U/L (ref 38–126)
Anion gap: 14 (ref 5–15)
BUN: 10 mg/dL (ref 6–20)
CO2: 22 mmol/L (ref 22–32)
Calcium: 9.7 mg/dL (ref 8.9–10.3)
Chloride: 101 mmol/L (ref 98–111)
Creatinine, Ser: 0.79 mg/dL (ref 0.61–1.24)
GFR, Estimated: >60 mL/min (ref >60)
Glucose, Bld: 146 mg/dL — ABNORMAL HIGH (ref 70–99)
Potassium: 3.2 mmol/L — ABNORMAL LOW (ref 3.5–5.1)
Sodium: 137 mmol/L (ref 135–145)
Total Bilirubin: 0.8 mg/dL (ref 0.3–1.2)
Total Protein: 8.3 g/dL — ABNORMAL HIGH (ref 6.5–8.1)

## 2022-12-16 LAB — CBC
HCT: 43.7 % (ref 39.0–52.0)
Hemoglobin: 14.8 g/dL (ref 13.0–17.0)
MCH: 32.5 pg (ref 26.0–34.0)
MCHC: 33.9 g/dL (ref 30.0–36.0)
MCV: 95.8 fL (ref 80.0–100.0)
Platelets: 132 10*3/uL — ABNORMAL LOW (ref 150–400)
RBC: 4.56 MIL/uL (ref 4.22–5.81)
RDW: 11.9 % (ref 11.5–15.5)
WBC: 6.5 10*3/uL (ref 4.0–10.5)
nRBC: 0 % (ref 0.0–0.2)

## 2022-12-16 LAB — SALICYLATE LEVEL: Salicylate Lvl: <7 mg/dL — ABNORMAL LOW (ref 7.0–30.0)

## 2022-12-16 LAB — ACETAMINOPHEN LEVEL: Acetaminophen (Tylenol), Serum: <10 ug/mL — ABNORMAL LOW (ref 10–30)

## 2022-12-16 LAB — ETHANOL: Alcohol, Ethyl (B): 210 mg/dL — ABNORMAL HIGH (ref <10)

## 2022-12-16 IMAGING — CR DG CHEST 2V
1 series · 2 of 2 positions shown · non-contrast
Comparison: December 04, 2021

CLINICAL DATA: Shortness of breath and weakness.

EXAM:
CHEST - 2 VIEW

[Series 4: w chest lat · 0.14mm/px · 2 of 2 slices shown]
[im 1/2]
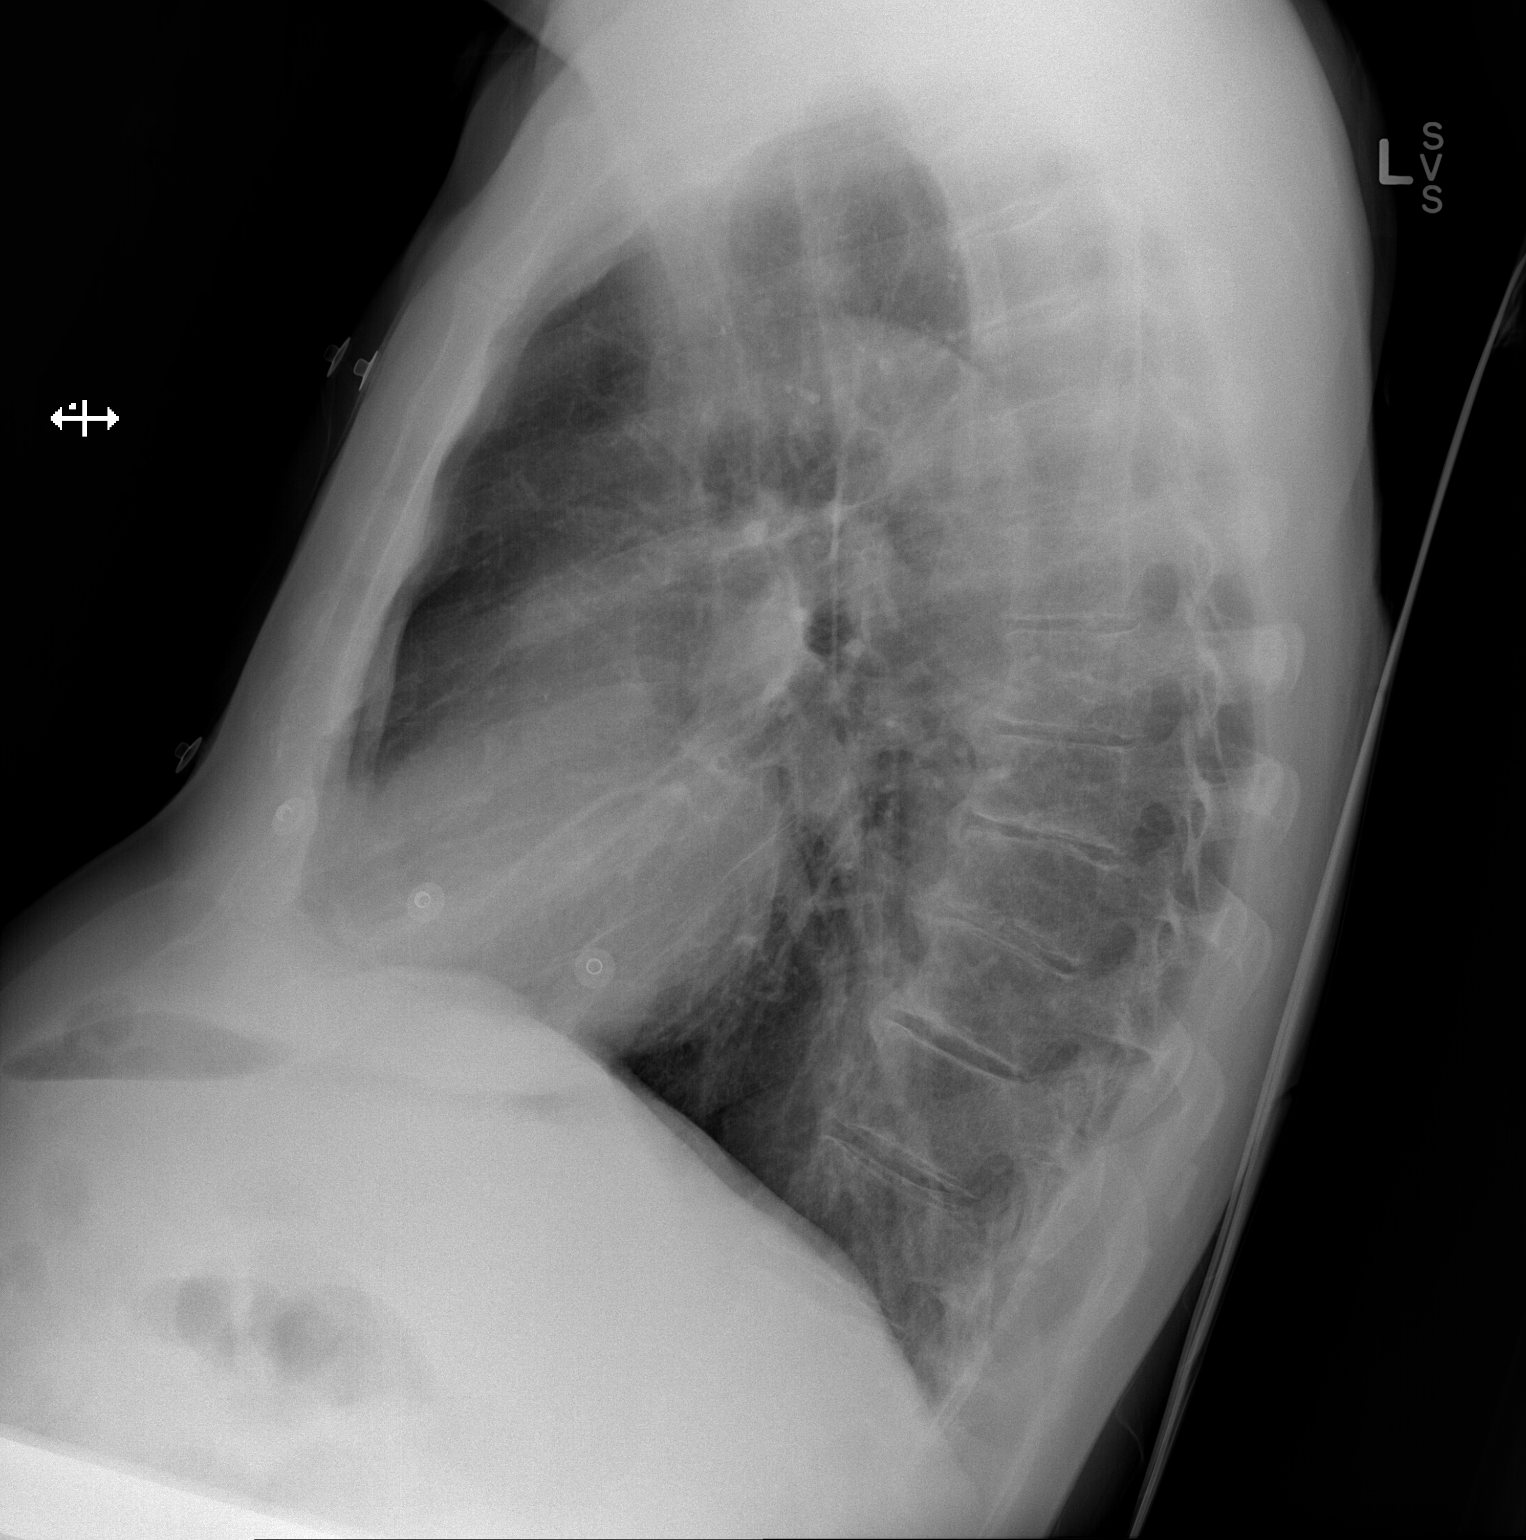
[im 2/2]
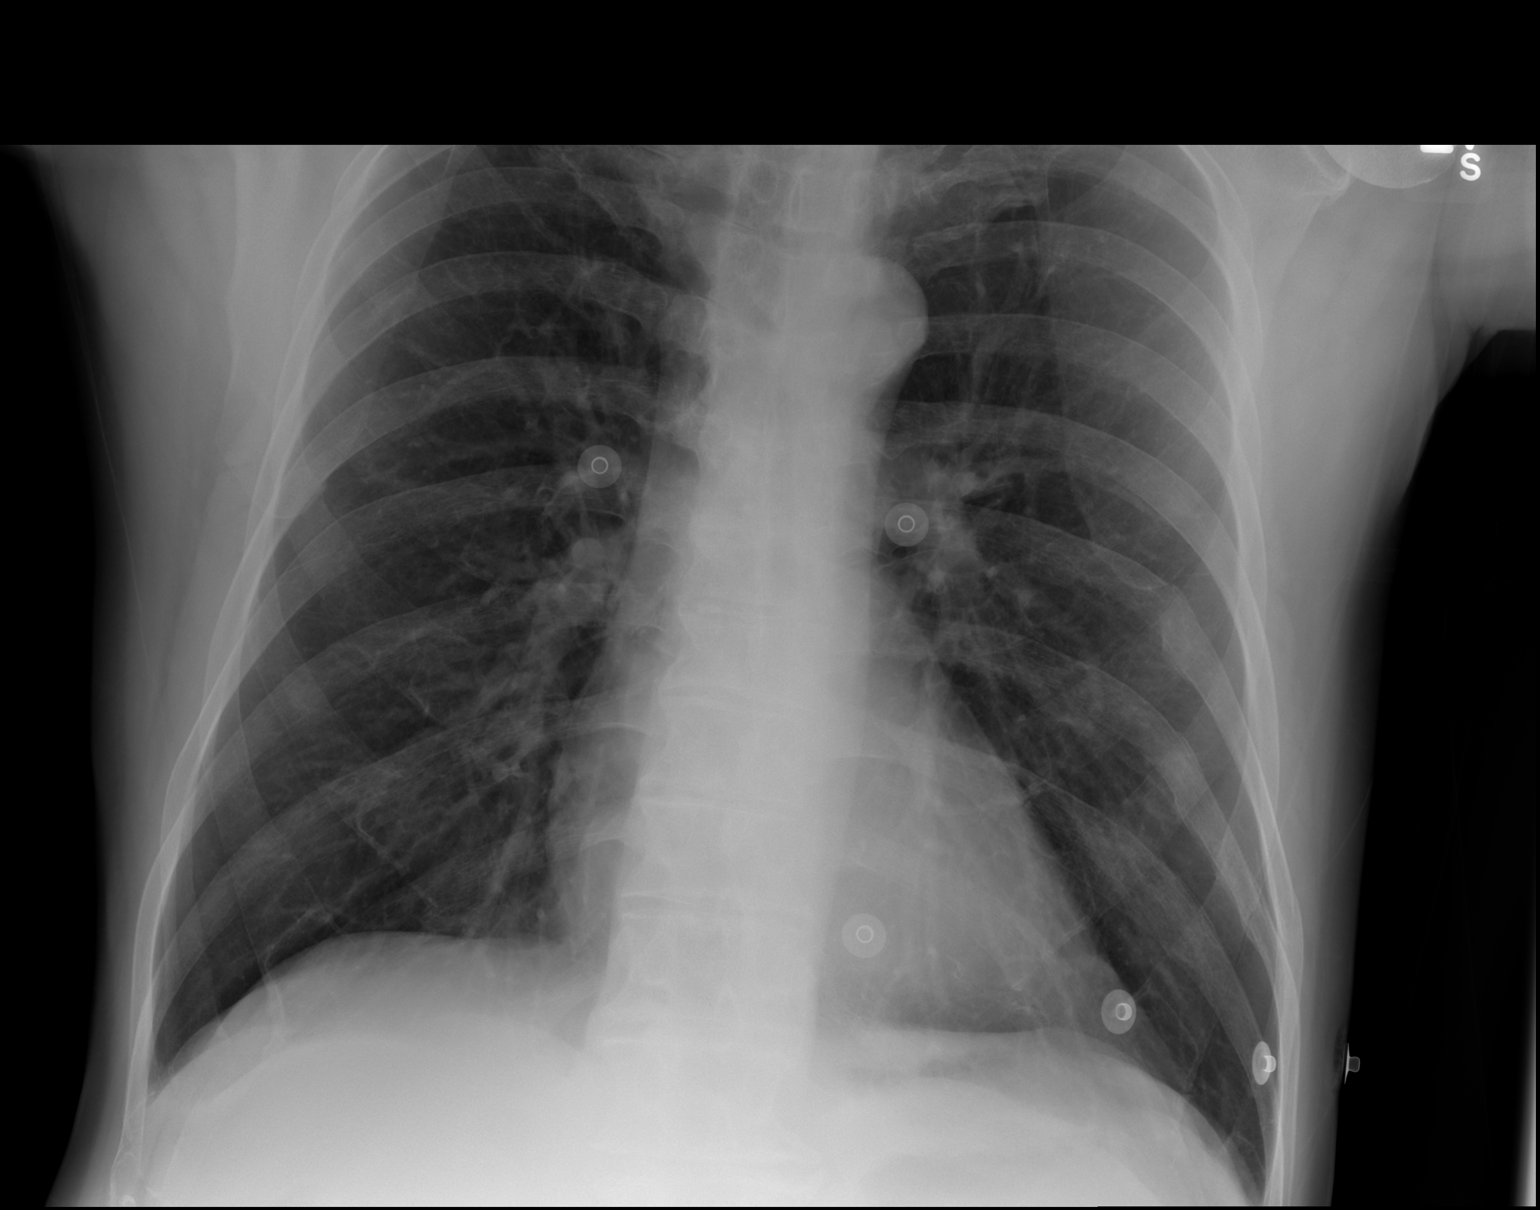

[2 of 2 positions shown; findings below may reference images not displayed]

FINDINGS: The heart size and mediastinal contours are within normal limits.
Both lungs are clear. Multiple chronic left-sided rib fractures are
seen.
IMPRESSION: No active cardiopulmonary disease.

## 2022-12-16 IMAGING — CT CT ANGIO CHEST
2 of 6 series · 18 of 46 positions shown · IV contrast (APPLIED)
Comparison: Chest x-ray 12/30/2021, CT chest 10/24/2021, CT chest
01/23/2019

CLINICAL DATA: History of PE, increased chest pain

EXAM:
CT ANGIOGRAPHY CHEST WITH CONTRAST
TECHNIQUE: Multidetector CT imaging of the chest was performed using the
standard protocol during bolus administration of intravenous
contrast. Multiplanar CT image reconstructions and MIPs were
obtained to evaluate the vascular anatomy.

[Series 6: thins · axial · 0.67mm/px · z∈[-1287,-1021]mm · 15 of 419 slices shown]
[im 19/419  lung]
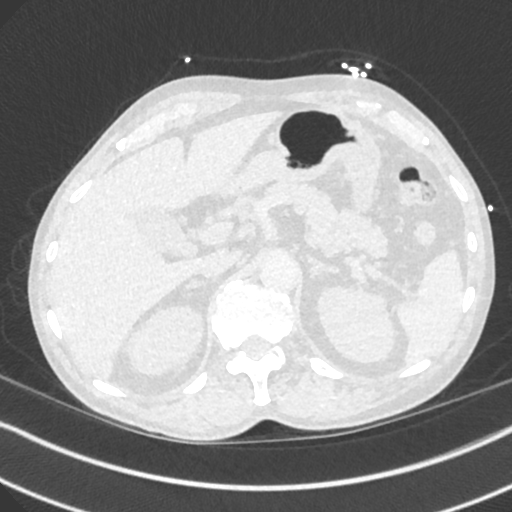
[im 55/419  soft-tissue]
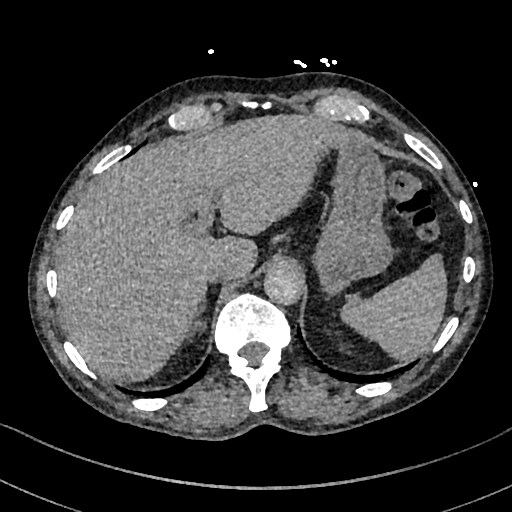
[im 73/419  lung]
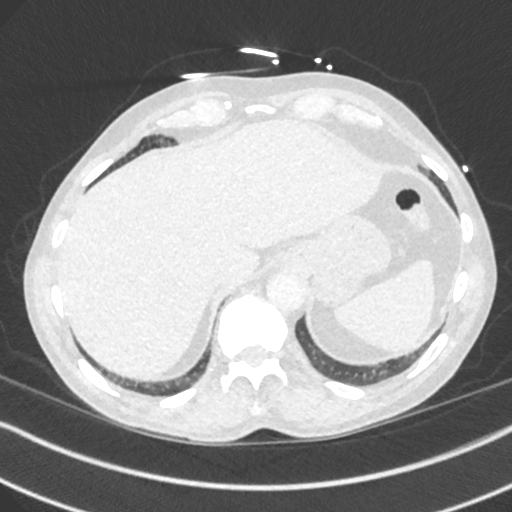
[im 110/419  soft-tissue]
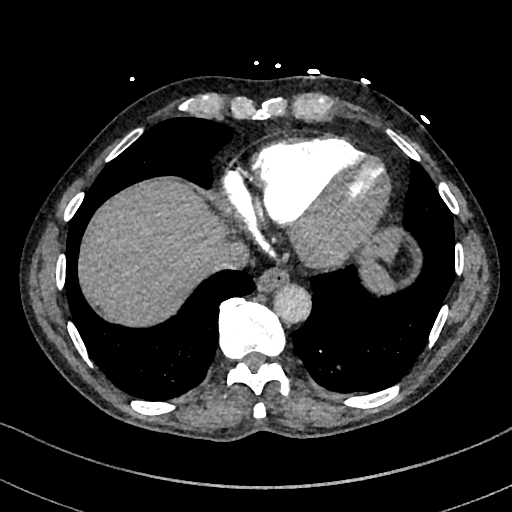
[im 128/419  lung]
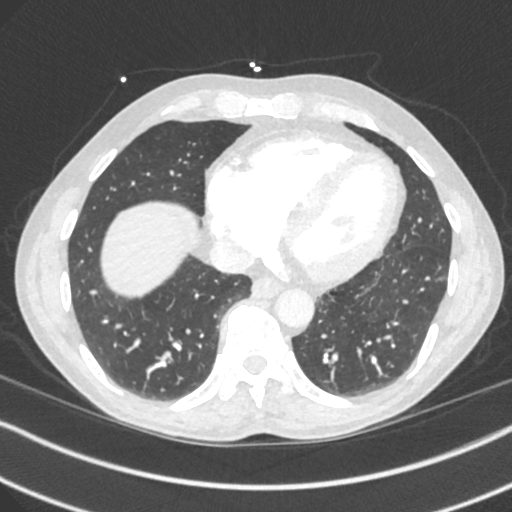
[im 164/419  soft-tissue]
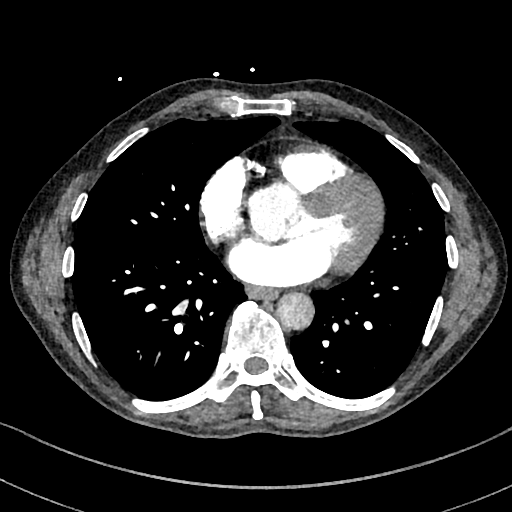
[im 182/419  lung]
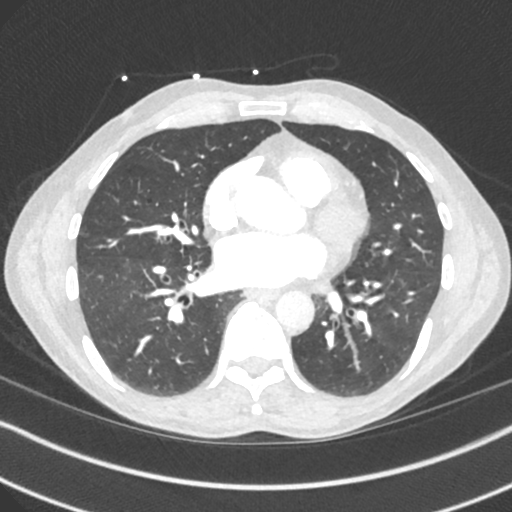
[im 219/419  soft-tissue]
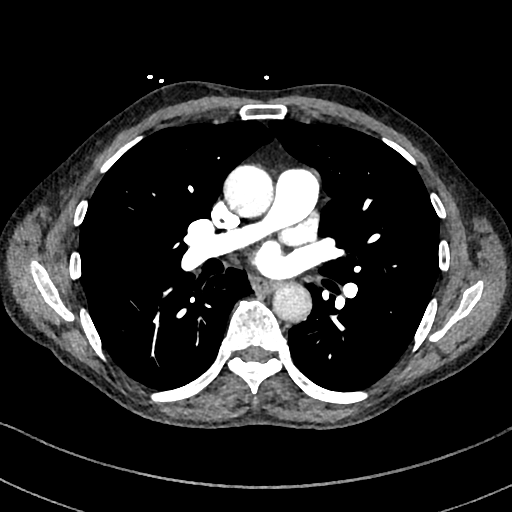
[im 237/419  lung]
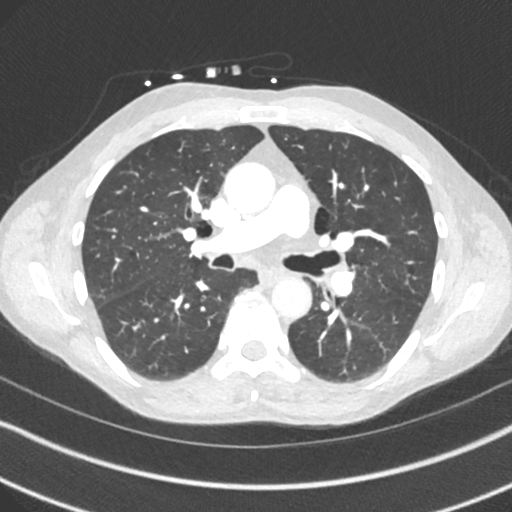
[im 255/419  soft-tissue]
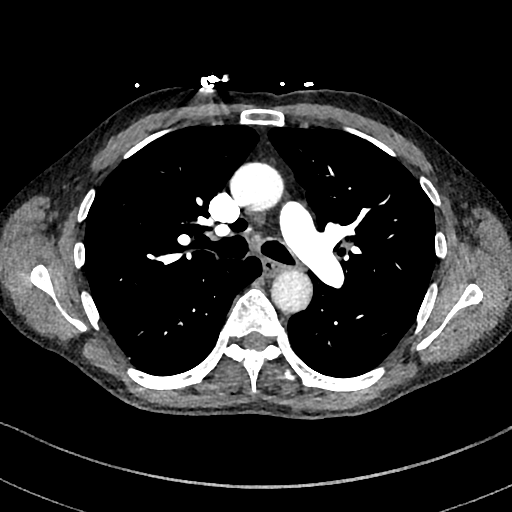
[im 291/419  lung]
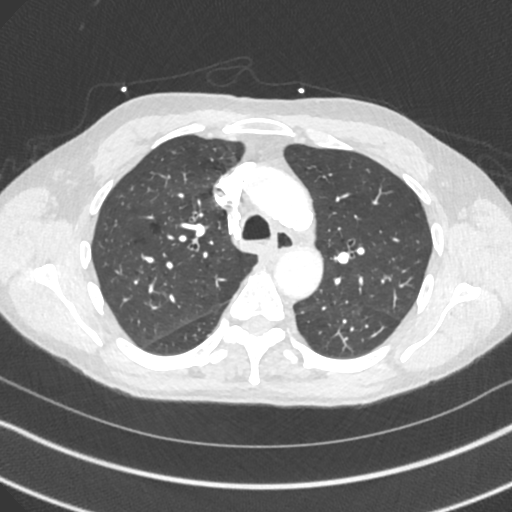
[im 309/419  soft-tissue]
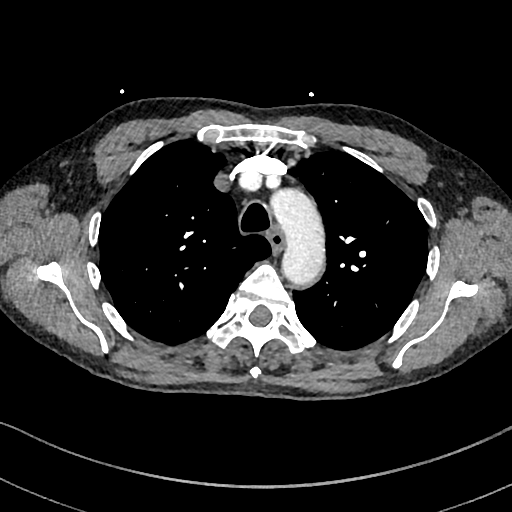
[im 346/419  lung]
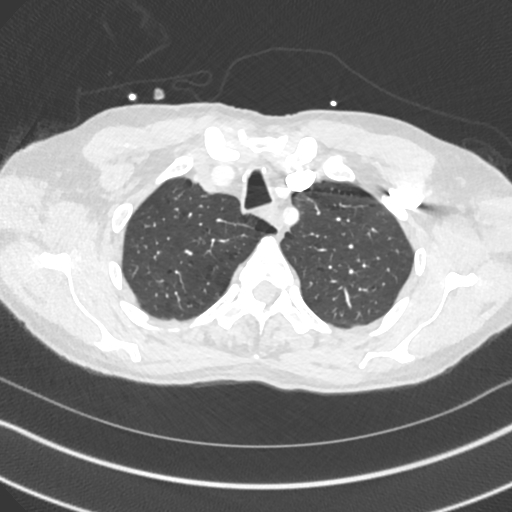
[im 364/419  soft-tissue]
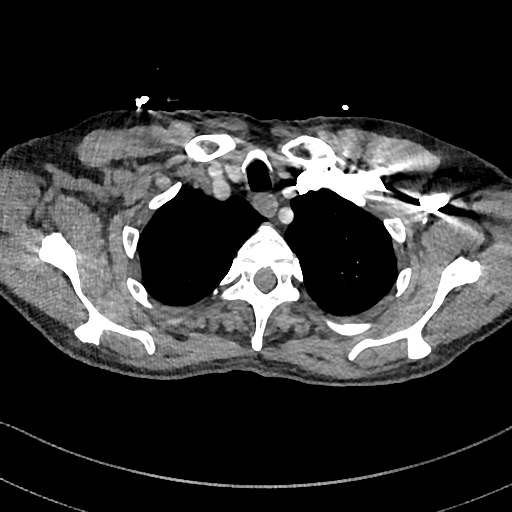
[im 400/419  lung]
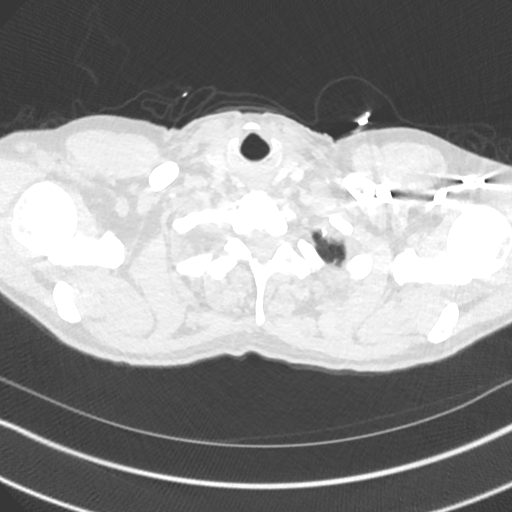

[Series 7: cor · coronal · 0.57mm/px · 3 of 128 slices shown]
[im 32/128  soft-tissue]
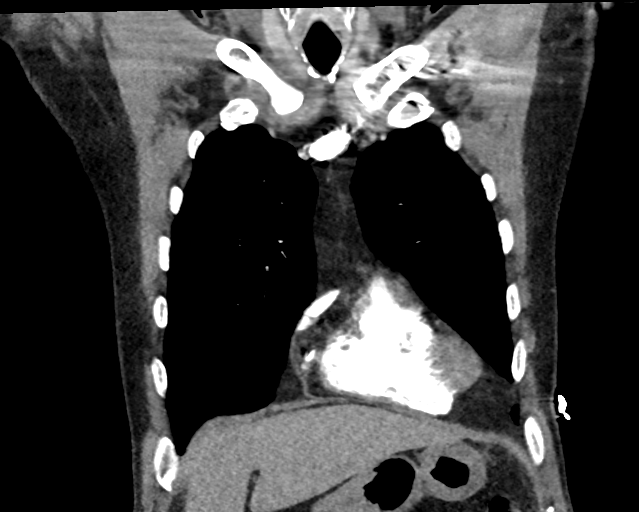
[im 64/128  soft-tissue]
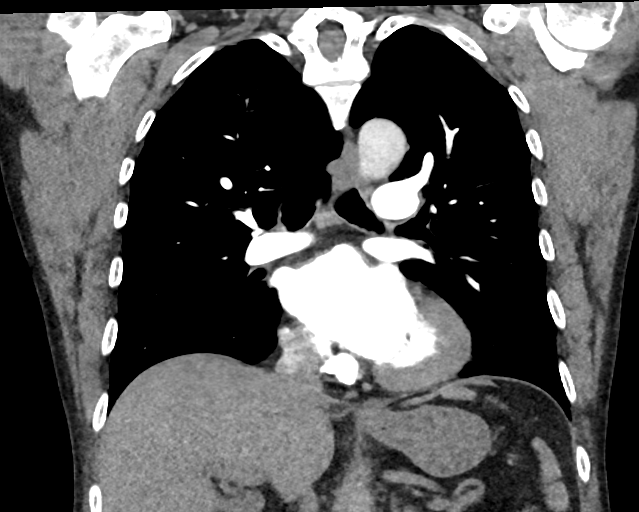
[im 96/128  soft-tissue]
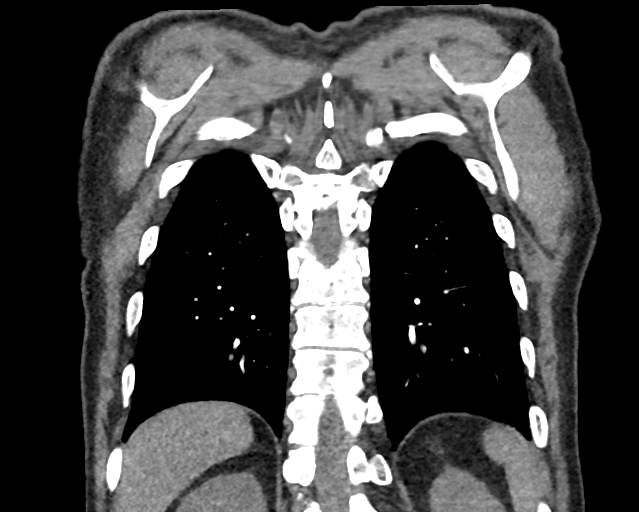

[18 of 46 positions shown; findings below may reference images not displayed]

RADIATION DOSE REDUCTION: This exam was performed according to the
departmental dose-optimization program which includes automated
exposure control, adjustment of the mA and/or kV according to
patient size and/or use of iterative reconstruction technique.

CONTRAST:  75mL OMNIPAQUE IOHEXOL 350 MG/ML SOLN
FINDINGS: Cardiovascular: Satisfactory opacification of the pulmonary arteries
to the segmental level. No evidence of pulmonary embolism.
Nonaneurysmal aorta. Mild atherosclerosis. Normal cardiac size. No
pericardial effusion.

Mediastinum/Nodes: Midline trachea. No suspicious thyroid nodule. No
suspicious adenopathy. Esophagus within normal limits.

Lungs/Pleura: Emphysema. No acute airspace disease, pleural
effusion, or pneumothorax. No suspicious lung nodules. Stable
punctate 2 mm right upper lobe posterior lung nodule, felt benign,
no follow-up imaging recommended.

Upper Abdomen: No acute abnormality.

Musculoskeletal: No chest wall abnormality. No acute or significant
osseous findings.

Review of the MIP images confirms the above findings.
IMPRESSION: 1. Negative for acute pulmonary embolus.
2. Emphysema without acute airspace disease

Aortic Atherosclerosis (P3MM0-IW2.2) and Emphysema (P3MM0-6E8.P).

## 2022-12-16 NOTE — ED Triage Notes (Signed)
T to ED from [facility] for being [employer]. Pt is CAOx4 and in no acute distress in triage. See first nurse note.

## 2022-12-16 NOTE — ED Provider Notes (Signed)
[facility] Provider Note    Event Date/Time   First MD Initiated Contact with Patient 12/16/22 2154     (approximate)   History   Psychiatric Evaluation   HPI  Jose [name] is a 62 y.o. male who presents to the emerged [place] department today because of concerns for suicidal ideation.  He states he has been feeling this way for roughly the past 3 weeks.  Does have thoughts of wanting to slit his wrist.  Patient says that he has had history of depression on and off and on 1 time and been on medication but denies any current medication.  The patient additionally complains of chronic lower extremity pain.     Physical Exam   Triage Vital Signs: ED Triage Vitals  Enc Vitals Group     BP 12/16/22 2141 (!) 137/95     Pulse Rate 12/16/22 2141 100     Resp 12/16/22 2141 16     Temp 12/16/22 2141 97.8 F (36.6 C)     Temp Source 12/16/22 2141 Oral     SpO2 12/16/22 2141 99 %     Weight 12/16/22 2142 149 lb 14.6 oz (68 kg)     Height 12/16/22 2142 5\' 8"  (1.727 m)     Head Circumference --      Peak Flow --      Pain Score 12/16/22 2142 0     Pain Loc --      Pain Edu? --      Excl. in [place]? --     Most recent vital signs: Vitals:   12/16/22 2141  BP: (!) 137/95  Pulse: 100  Resp: 16  Temp: 97.8 F (36.6 C)  SpO2: 99%   General: Awake, alert, oriented. CV:  Good peripheral perfusion. Regular rate and rhythm. Resp:  Normal effort. Lungs clear. Abd:  No distention.    ED Results / Procedures / Treatments   Labs (all labs ordered are listed, but only abnormal results are displayed) Labs Reviewed  COMPREHENSIVE METABOLIC PANEL - Abnormal; Notable for the following components:      Result Value   Potassium 3.2 (*)    Glucose, Bld 146 (*)    Total Protein 8.3 (*)    AST 390 (*)    ALT 267 (*)    All other components within normal limits  CBC - Abnormal; Notable for the following components:   Platelets 132 (*)    All other components  within normal limits  ETHANOL  SALICYLATE LEVEL  ACETAMINOPHEN LEVEL  URINE DRUG SCREEN, QUALITATIVE ([facility] ONLY)     EKG  None   RADIOLOGY None   PROCEDURES:  Critical Care performed: No    MEDICATIONS ORDERED IN ED: Medications - No data to display   IMPRESSION / MDM / [place] / ED COURSE  I reviewed the triage vital signs and the nursing notes.                              Differential diagnosis includes, but is not limited to, psychiatric illness, substance induced mood disorder  Patient's presentation is most consistent with acute presentation with potential threat to life or bodily function.  Patient presents to the emergency department today because of concerns for suicidal ideation.   Will have psychiatry evaluate.  The patient has been placed in psychiatric observation due to the need to provide a safe environment for  the patient while obtaining psychiatric consultation and evaluation, as well as ongoing medical and medication management to treat the patient's condition.  The patient has not been placed under full IVC at this time.    FINAL CLINICAL IMPRESSION(S) / ED DIAGNOSES   Depression   Note:  This document was prepared using Dragon voice recognition software and may include unintentional dictation errors.    Nance Pear, MD 12/16/22 2224

## 2022-12-16 NOTE — ED Triage Notes (Signed)
First RN Note: Pt to ED via [facility] with c/o SI. Per EMS pt with ETOH and cocaine use tonight. Per EMS pt also c/o pain everywhere. Per EMS pt endorses plan to slit his wrists. Pt arrives to triage calm and cooperative sitting in wheelchair. Pt states, "been a bad night". Per EMS pt hypertensive en route, however pt is non-compliant with home medications.

## 2022-12-16 NOTE — ED Notes (Addendum)
Pt dressed out :  1 pair brown shoes Orange pants Brown tshirt Green jacket [employer] boxers

## 2022-12-16 NOTE — Consult Note (Signed)
[facility] Psychiatry Consult   Reason for Consult:  Psych Evaluation Referring Physician:  Dr. Archie Balboa Patient Identification: Jose [name] MRN:  [id number] Principal Diagnosis: Alcohol use disorder, severe, dependence ([place]) Diagnosis:  Principal Problem:   Alcohol use disorder, severe, dependence ([place]) Active Problems:   Stimulant use disorder ([place]) (cocaine)   Substance induced mood disorder ([place])   Homeless   Patient's noncompliance with other medical treatment and regimen due to financial hardship   Total Time spent with patient: 20 minutes  Subjective:     HPI:  Jose [name] is a 62 y.o. male patient presented to [facility] ED via POV voluntarily complained of consuming alcohol and having passive suicidal ideation. The patient's BAL 210 mg/dl and UDS are remarkable for Cocaine. The patient is homeless.  This Probation officer discussed with the EDP and also shared that the patient may remain in the ED to metabolize illicit substances. He can be discharged in the morning, considering he remains mentally stable.   Past Psychiatric History: alcohol use disorder  Risk to Self:   Risk to Others:   Prior Inpatient Therapy:   Prior Outpatient Therapy:    Past Medical History:  Past Medical History:  Diagnosis Date   Hep C w/o coma, chronic ([place])    Hypertension    Polysubstance abuse ([place])    Pulmonary embolism ([place])     Past Surgical History:  Procedure Laterality Date   ESOPHAGOGASTRODUODENOSCOPY (EGD) WITH PROPOFOL N/A 03/02/2019   Procedure: ESOPHAGOGASTRODUODENOSCOPY (EGD) WITH PROPOFOL;  Surgeon: Georganna Skeans, MD;  Location: [place];  Service: General;  Laterality: N/A;   IR GASTROSTOMY TUBE REMOVAL  05/04/2019   PEG PLACEMENT N/A 03/02/2019   Procedure: PERCUTANEOUS ENDOSCOPIC GASTROSTOMY (PEG) PLACEMENT;  Surgeon: Georganna Skeans, MD;  Location: [place];  Service: General;  Laterality: N/A;   PERCUTANEOUS TRACHEOSTOMY N/A 02/12/2019   Procedure:  PERCUTANEOUS TRACHEOSTOMY;  Surgeon: Georganna Skeans, MD;  Location: [place];  Service: General;  Laterality: N/A;   Family History: History reviewed. No pertinent family history. Family Psychiatric  History:  Social History:  Social History   Substance and Sexual Activity  Alcohol Use Yes   Alcohol/week: 2.0 standard drinks of alcohol   Types: 2 Cans of beer per week   Comment: 1 beer today     Social History   Substance and Sexual Activity  Drug Use Yes   Types: Marijuana    Social History   Socioeconomic History   Marital status: Divorced    Spouse name: Not on file   Number of children: Not on file   Years of education: Not on file   Highest education level: Not on file  Occupational History   Not on file  Tobacco Use   Smoking status: Every Day    Packs/day: 0.50    Years: 45.00    Additional pack years: 0.00    Total pack years: 22.50    Types: Cigarettes   Smokeless tobacco: Never  Vaping Use   Vaping Use: Never used  Substance and Sexual Activity   Alcohol use: Yes    Alcohol/week: 2.0 standard drinks of alcohol    Types: 2 Cans of beer per week    Comment: 1 beer today   Drug use: Yes    Types: Marijuana   Sexual activity: Never  Other Topics Concern   Not on file  Social History Narrative   ** Merged History Encounter **       Social Determinants of Health  Financial Resource Strain: Not on file  Food Insecurity: Not on file  Transportation Needs: Not on file  Physical Activity: Not on file  Stress: Not on file  Social Connections: Not on file   Additional Social History:    Allergies:  No Known Allergies  Labs:  Results for orders placed or performed during the hospital encounter of 12/16/22 (from the past 48 hour(s))  Comprehensive metabolic panel     Status: Abnormal   Collection Time: 12/16/22  9:43 PM  Result Value Ref Range   Sodium 137 135 - 145 mmol/L   Potassium 3.2 (L) 3.5 - 5.1 mmol/L   Chloride 101 98 - 111 mmol/L   CO2  22 22 - 32 mmol/L   Glucose, Bld 146 (H) 70 - 99 mg/dL    Comment: Glucose reference range applies only to samples taken after fasting for at least 8 hours.   BUN 10 6 - 20 mg/dL   Creatinine, Ser 0.79 0.61 - 1.24 mg/dL   Calcium 9.7 8.9 - 10.3 mg/dL   Total Protein 8.3 (H) 6.5 - 8.1 g/dL   Albumin 4.0 3.5 - 5.0 g/dL   AST 390 (H) 15 - 41 U/L   ALT 267 (H) 0 - 44 U/L   Alkaline Phosphatase 91 38 - 126 U/L   Total Bilirubin 0.8 0.3 - 1.2 mg/dL   GFR, Estimated >60 >60 mL/min    Comment: (NOTE) Calculated using the CKD-EPI Creatinine Equation (2021)    Anion gap 14 5 - 15    Comment: Performed at [facility], [address] [place], [place] [address]  Ethanol     Status: Abnormal   Collection Time: 12/16/22  9:43 PM  Result Value Ref Range   Alcohol, Ethyl (B) 210 (H) <10 mg/dL    Comment: (NOTE) Lowest detectable limit for serum alcohol is 10 mg/dL.  For medical purposes only. Performed at [facility], [address] [place], [place] [id number]   Salicylate level     Status: Abnormal   Collection Time: 12/16/22  9:43 PM  Result Value Ref Range   Salicylate Lvl Q000111Q (L) 7.0 - 30.0 mg/dL    Comment: Performed at [facility], [address] [place], [place] [address]  Acetaminophen level     Status: Abnormal   Collection Time: 12/16/22  9:43 PM  Result Value Ref Range   Acetaminophen (Tylenol), Serum <10 (L) 10 - 30 ug/mL    Comment: (NOTE) Therapeutic concentrations vary significantly. A range of 10-30 ug/mL  may be an effective concentration for many patients. However, some  are best treated at concentrations outside of this range. Acetaminophen concentrations >150 ug/mL at 4 hours after ingestion  and >50 ug/mL at 12 hours after ingestion are often associated with  toxic reactions.  Performed at [facility], [address] [place], [place] [address]   cbc     Status: Abnormal   Collection Time: 12/16/22  9:43 PM  Result  Value Ref Range   WBC 6.5 4.0 - 10.5 K/uL   RBC 4.56 4.22 - 5.81 MIL/uL   Hemoglobin 14.8 13.0 - 17.0 g/dL   HCT 43.7 39.0 - 52.0 %   MCV 95.8 80.0 - 100.0 fL   MCH 32.5 26.0 - 34.0 pg   MCHC 33.9 30.0 - 36.0 g/dL   RDW 11.9 11.5 - 15.5 %   Platelets 132 (L) 150 - 400 K/uL   nRBC 0.0 0.0 - 0.2 %  Comment: Performed at [facility], [address] [place], [place] [address]  Urine Drug Screen, Qualitative     Status: Abnormal   Collection Time: 12/16/22  9:43 PM  Result Value Ref Range   Tricyclic, Ur Screen NONE DETECTED NONE DETECTED   Amphetamines, Ur Screen NONE DETECTED NONE DETECTED   MDMA (Ecstasy)Ur Screen NONE DETECTED NONE DETECTED   Cocaine Metabolite,Ur [place] POSITIVE (A) NONE DETECTED   Opiate, Ur Screen NONE DETECTED NONE DETECTED   Phencyclidine (PCP) Ur S NONE DETECTED NONE DETECTED   Cannabinoid 50 Ng, Ur [place] NONE DETECTED NONE DETECTED   Barbiturates, Ur Screen NONE DETECTED NONE DETECTED   Benzodiazepine, Ur Scrn NONE DETECTED NONE DETECTED   Methadone Scn, Ur NONE DETECTED NONE DETECTED    Comment: (NOTE) Tricyclics + metabolites, urine    Cutoff 1000 ng/mL Amphetamines + metabolites, urine  Cutoff 1000 ng/mL MDMA (Ecstasy), urine              Cutoff 500 ng/mL Cocaine Metabolite, urine          Cutoff 300 ng/mL Opiate + metabolites, urine        Cutoff 300 ng/mL Phencyclidine (PCP), urine         Cutoff 25 ng/mL Cannabinoid, urine                 Cutoff 50 ng/mL Barbiturates + metabolites, urine  Cutoff 200 ng/mL Benzodiazepine, urine              Cutoff 200 ng/mL Methadone, urine                   Cutoff 300 ng/mL  The urine drug screen provides only a preliminary, unconfirmed analytical test result and should not be used for non-medical purposes. Clinical consideration and professional judgment should be applied to any positive drug screen result due to possible interfering substances. A more specific alternate chemical method must be used in  order to obtain a confirmed analytical result. Gas chromatography / mass spectrometry (GC/MS) is the preferred confirm atory method. Performed at [facility], [address] [place], [place] [address]     No current facility-administered medications for this encounter.   Current Outpatient Medications  Medication Sig Dispense Refill   amLODipine (NORVASC) 5 MG tablet Take 1 tablet (5 mg total) by mouth daily. 30 tablet 1   apixaban (ELIQUIS) 5 MG TABS tablet Take 1 tablet (5 mg total) by mouth 2 (two) times daily. (Patient not taking: Reported on 12/03/2022) 60 tablet 1   lidocaine (LIDODERM) 5 % Place 1 patch onto the skin every 12 (twelve) hours. Remove & Discard patch within 12 hours or as directed by MD (Patient not taking: Reported on 12/03/2022) 10 patch 0   ondansetron (ZOFRAN-ODT) 4 MG disintegrating tablet Take 1 tablet (4 mg total) by mouth every 8 (eight) hours as needed for nausea or vomiting. (Patient not taking: Reported on 10/30/2022) 20 tablet 0    Musculoskeletal: Strength & Muscle Tone: within normal limits Gait & Station: normal Patient leans: N/A  Judgment: Poor  Insight: Poor   Executive Functions  Concentration: Fair  Attention Span: Fair  Recall: [place] of Knowledge: Fair  Language: Fair   Psychomotor Activity  Psychomotor Activity:No data recorded  Assets  Assets: Armed forces logistics/support/administrative officer; Resilience; Social Support   Sleep  Sleep:No data recorded  Physical Exam: Physical Exam Vitals and nursing note reviewed.  Psychiatric:        Mood and  Affect: Mood is depressed.    Review of Systems  Psychiatric/Behavioral:  Positive for depression and substance abuse. The patient is nervous/anxious.   All other systems reviewed and are negative.  Blood pressure (!) 137/95, pulse 100, temperature 97.8 F (36.6 C), temperature source Oral, resp. rate 16, height 5\' 8"  (1.727 m), weight 68 kg, SpO2 99 %. Body mass index is 22.79  kg/m.  Disposition:  Patient will be reassessed in the am once patient become sober  Deloria Lair, NP 12/16/2022 11:22 PM

## 2022-12-17 DIAGNOSIS — F1020 Alcohol dependence, uncomplicated: Secondary | ICD-10-CM

## 2022-12-17 MED ORDER — IBUPROFEN 400 MG PO TABS
400.0000 mg | ORAL_TABLET | Freq: Once | ORAL | Status: AC
  Administered 2022-12-17: 400 mg via ORAL
  Filled 2022-12-17: qty 1

## 2022-12-17 NOTE — ED Notes (Signed)
E-signature not working at this time. Pt verbalized understanding of D/C instructions, prescriptions and follow up care with no further questions at this time. Pt in NAD and ambulatory at time of D/C.  

## 2022-12-17 NOTE — ED Provider Notes (Signed)
-----------------------------------------   8:50 AM on 12/17/2022 -----------------------------------------  The patient has been reassessed by psychiatry and cleared for discharge.  Return precautions provided.   Arta Silence, MD 12/17/22 [phone]

## 2022-12-17 NOTE — ED Provider Notes (Signed)
Emergency Medicine Observation Re-evaluation Note  Jose [name] is a 62 y.o. male, seen on rounds today.  Pt initially presented to the ED for complaints of Psychiatric Evaluation Currently, the patient is sleeping comfortably.  Physical Exam  BP (!) 137/95 (BP Location: Right Arm)   Pulse 100   Temp 97.8 F (36.6 C) (Oral)   Resp 16   Ht 5\' 8"  (1.727 m)   Wt 68 kg   SpO2 99%   BMI 22.79 kg/m  Physical Exam Constitutional: Resting comfortably. Eyes: Conjunctivae are normal. Head: Atraumatic. Nose: No congestion/rhinnorhea. Mouth/Throat: Mucous membranes are moist. Neck: Normal ROM Cardiovascular: No cyanosis noted. Respiratory: Normal respiratory effort. Gastrointestinal: Non-distended. Genitourinary: deferred Musculoskeletal: No lower extremity tenderness nor edema. Neurologic:  Normal speech and language. No gross focal neurologic deficits are appreciated. Skin:  Skin is warm, dry and intact. No rash noted.   ED Course / MDM  EKG:   I have reviewed the labs performed to date as well as medications administered while in observation.  Recent changes in the last 24 hours include none.  Plan  Current plan is for reassessment by psych team in the morning.    Blake Divine, MD 12/17/22 [phone]

## 2022-12-17 NOTE — ED Notes (Signed)
Pt up to the bathroom at this time and is wanting to be discharged. RN made aware.

## 2022-12-17 NOTE — ED Notes (Signed)
Pt given belongings bag 1/1 at time of discharge

## 2022-12-17 NOTE — ED Notes (Signed)
Pt ambulated to bathroom and is now asking for ibuprofen. MD verbal for 400 mg.

## 2022-12-17 NOTE — ED Notes (Signed)
Pt asking for meds for pain. Pt was offered tylenol and ibuprofen and he refused at this time.

## 2022-12-17 NOTE — Consult Note (Addendum)
[facility] Face-to-Face Psychiatry Consult   Reason for Consult:  SI Referring Physician:  Dr. Archie Balboa Patient Identification: Jose [name] MRN:  [id number] Principal Diagnosis: Alcohol use disorder, severe, dependence ([place]) Diagnosis:  Principal Problem:   Alcohol use disorder, severe, dependence ([place]) Active Problems:   Stimulant use disorder ([place]) (cocaine)   Substance induced mood disorder ([place])   Homeless   Patient's noncompliance with other medical treatment and regimen due to financial hardship   Total Time spent with patient: 45 minutes  Subjective: "I was thinking about hurting myself".   Jose [name] is a 62 y.o. male patient admitted with suicidal ideation. The patient reports previously having thoughts of self-harm and admits to feeling hopeless at times. The patient appears to self-medicate with alcohol, reporting that when he drinks, he does not think about his problems as much.  He denies history of seizures. The patient reports he has been homeless for the past 2 years and is currently sleeping on a friend's porch. Patient reports he enjoys fishing when he can find transportation. He acknowledges the need to quit drinking but plans to do so in 2 to 3 months, stating that he was told his liver would shut down anyway. His sleep and appetite are reported as satisfactory. The patient reports that he is not interested in substance abuse treatment at this time.   HPI: Jose [name] is a 62 y.o. male who presented to the emergency department with a chief complaint of suicidal ideation. The patient has an extensive past psychiatric history of alcohol and substance abuse.  He is alert and oriented, speech is clear and coherent, mood congruent with affect. The patient appears stable with no tremors or shakiness noted, nor is there any evidence of withdrawal symptoms present at this time. He denies current suicidal or homicidal ideation. He denies auditory or visual  hallucinations.  He does not appear to be responding to internal stimuli, nor is there any evidence of delusional thinking. UDS + cocaine. BAL 210. PDMP reviewed, no active prescriptions noted.    There is no evidence of acute psychiatric illness at this time and no evidence that he is suicidal or homicidal.  Discussed the risk of continued alcohol use and the benefits of quitting. Encouraged the patient to consider substance abuse treatment. Patient informed that in addition to outpatient therapy, there are more intensive outpatient options that do not require hospitalization. Patient declined resources at this time. The patient appears stable for discharge at this time. Plan reviewed with Dr. Cherylann Banas, [place].   Past Psychiatric History: Past psychiatric history significant for alcohol abuse and substance abuse.  The patient denies past suicide attempts. No past psychiatric hospitalizations reported.  Risk to Self:  No  Risk to Others:  No  Prior Inpatient Therapy:  No  Prior Outpatient Therapy:  No   Past Medical History:  Past Medical History:  Diagnosis Date   Hep C w/o coma, chronic ([place])    Hypertension    Polysubstance abuse ([place])    Pulmonary embolism ([place])     Past Surgical History:  Procedure Laterality Date   ESOPHAGOGASTRODUODENOSCOPY (EGD) WITH PROPOFOL N/A 03/02/2019   Procedure: ESOPHAGOGASTRODUODENOSCOPY (EGD) WITH PROPOFOL;  Surgeon: Georganna Skeans, MD;  Location: [place];  Service: General;  Laterality: N/A;   IR GASTROSTOMY TUBE REMOVAL  05/04/2019   PEG PLACEMENT N/A 03/02/2019   Procedure: PERCUTANEOUS ENDOSCOPIC GASTROSTOMY (PEG) PLACEMENT;  Surgeon: Georganna Skeans, MD;  Location: [facility];  Service: General;  Laterality: N/A;  PERCUTANEOUS TRACHEOSTOMY N/A 02/12/2019   Procedure: PERCUTANEOUS TRACHEOSTOMY;  Surgeon: Georganna Skeans, MD;  Location: [facility];  Service: General;  Laterality: N/A;   Family History: History reviewed. No pertinent family  history. Family Psychiatric  History: None reported Social History:  Social History   Substance and Sexual Activity  Alcohol Use Yes   Alcohol/week: 2.0 standard drinks of alcohol   Types: 2 Cans of beer per week   Comment: 1 beer today     Social History   Substance and Sexual Activity  Drug Use Yes   Types: Marijuana    Social History   Socioeconomic History   Marital status: Divorced    Spouse name: Not on file   Number of children: Not on file   Years of education: Not on file   Highest education level: Not on file  Occupational History   Not on file  Tobacco Use   Smoking status: Every Day    Packs/day: 0.50    Years: 45.00    Additional pack years: 0.00    Total pack years: 22.50    Types: Cigarettes   Smokeless tobacco: Never  Vaping Use   Vaping Use: Never used  Substance and Sexual Activity   Alcohol use: Yes    Alcohol/week: 2.0 standard drinks of alcohol    Types: 2 Cans of beer per week    Comment: 1 beer today   Drug use: Yes    Types: Marijuana   Sexual activity: Never  Other Topics Concern   Not on file  Social History Narrative   ** Merged History Encounter **       Social Determinants of Health   Financial Resource Strain: Not on file  Food Insecurity: Not on file  Transportation Needs: Not on file  Physical Activity: Not on file  Stress: Not on file  Social Connections: Not on file   Additional Social History:    Allergies:  No Known Allergies  Labs:  Results for orders placed or performed during the hospital encounter of 12/16/22 (from the past 48 hour(s))  Comprehensive metabolic panel     Status: Abnormal   Collection Time: 12/16/22  9:43 PM  Result Value Ref Range   Sodium 137 135 - 145 mmol/L   Potassium 3.2 (L) 3.5 - 5.1 mmol/L   Chloride 101 98 - 111 mmol/L   CO2 22 22 - 32 mmol/L   Glucose, Bld 146 (H) 70 - 99 mg/dL    Comment: Glucose reference range applies only to samples taken after fasting for at least 8 hours.    BUN 10 6 - 20 mg/dL   Creatinine, Ser 0.79 0.61 - 1.24 mg/dL   Calcium 9.7 8.9 - 10.3 mg/dL   Total Protein 8.3 (H) 6.5 - 8.1 g/dL   Albumin 4.0 3.5 - 5.0 g/dL   AST 390 (H) 15 - 41 U/L   ALT 267 (H) 0 - 44 U/L   Alkaline Phosphatase 91 38 - 126 U/L   Total Bilirubin 0.8 0.3 - 1.2 mg/dL   GFR, Estimated >60 >60 mL/min    Comment: (NOTE) Calculated using the CKD-EPI Creatinine Equation (2021)    Anion gap 14 5 - 15    Comment: Performed at [facility], [address] [place], [place] [address]  Ethanol     Status: Abnormal   Collection Time: 12/16/22  9:43 PM  Result Value Ref Range   Alcohol, Ethyl (B) 210 (H) <10 mg/dL    Comment: (NOTE)  Lowest detectable limit for serum alcohol is 10 mg/dL.  For medical purposes only. Performed at [facility], [address] [place], [place] [id number]   Salicylate level     Status: Abnormal   Collection Time: 12/16/22  9:43 PM  Result Value Ref Range   Salicylate Lvl [id number] (L) 7.0 - 30.0 mg/dL    Comment: Performed at [facility], [address] [place], [place] [address]  Acetaminophen level     Status: Abnormal   Collection Time: 12/16/22  9:43 PM  Result Value Ref Range   Acetaminophen (Tylenol), Serum <10 (L) 10 - 30 ug/mL    Comment: (NOTE) Therapeutic concentrations vary significantly. A range of 10-30 ug/mL  may be an effective concentration for many patients. However, some  are best treated at concentrations outside of this range. Acetaminophen concentrations >150 ug/mL at 4 hours after ingestion  and >50 ug/mL at 12 hours after ingestion are often associated with  toxic reactions.  Performed at [facility], [address] [place], [place] [address]   cbc     Status: Abnormal   Collection Time: 12/16/22  9:43 PM  Result Value Ref Range   WBC 6.5 4.0 - 10.5 K/uL   RBC 4.56 4.22 - 5.81 MIL/uL   Hemoglobin 14.8 13.0 - 17.0 g/dL   HCT 43.7 39.0 - 52.0 %   MCV 95.8 80.0 -  100.0 fL   MCH 32.5 26.0 - 34.0 pg   MCHC 33.9 30.0 - 36.0 g/dL   RDW 11.9 11.5 - 15.5 %   Platelets 132 (L) 150 - 400 K/uL   nRBC 0.0 0.0 - 0.2 %    Comment: Performed at [facility], [address], [place],  [address]  Urine Drug Screen, Qualitative     Status: Abnormal   Collection Time: 12/16/22  9:43 PM  Result Value Ref Range   Tricyclic, Ur Screen NONE DETECTED NONE DETECTED   Amphetamines, Ur Screen NONE DETECTED NONE DETECTED   MDMA (Ecstasy)Ur Screen NONE DETECTED NONE DETECTED   Cocaine Metabolite,Ur Uncertain POSITIVE (A) NONE DETECTED   Opiate, Ur Screen NONE DETECTED NONE DETECTED   Phencyclidine (PCP) Ur S NONE DETECTED NONE DETECTED   Cannabinoid 50 Ng, Ur [place] NONE DETECTED NONE DETECTED   Barbiturates, Ur Screen NONE DETECTED NONE DETECTED   Benzodiazepine, Ur Scrn NONE DETECTED NONE DETECTED   Methadone Scn, Ur NONE DETECTED NONE DETECTED    Comment: (NOTE) Tricyclics + metabolites, urine    Cutoff 1000 ng/mL Amphetamines + metabolites, urine  Cutoff 1000 ng/mL MDMA (Ecstasy), urine              Cutoff 500 ng/mL Cocaine Metabolite, urine          Cutoff 300 ng/mL Opiate + metabolites, urine        Cutoff 300 ng/mL Phencyclidine (PCP), urine         Cutoff 25 ng/mL Cannabinoid, urine                 Cutoff 50 ng/mL Barbiturates + metabolites, urine  Cutoff 200 ng/mL Benzodiazepine, urine              Cutoff 200 ng/mL Methadone, urine                   Cutoff 300 ng/mL  The urine drug screen provides only a preliminary, unconfirmed analytical test result and should not be used for non-medical purposes. Clinical consideration and professional judgment should  be applied to any positive drug screen result due to possible interfering substances. A more specific alternate chemical method must be used in order to obtain a confirmed analytical result. Gas chromatography / mass spectrometry (GC/MS) is the preferred confirm atory method. Performed at [facility], [address] [place], [place] [address]     No current facility-administered medications for this encounter.   Current Outpatient Medications  Medication Sig Dispense Refill   amLODipine (NORVASC) 5 MG tablet Take 1 tablet (5 mg total) by mouth daily. 30 tablet 1   apixaban (ELIQUIS) 5 MG TABS tablet Take 1 tablet (5 mg total) by mouth 2 (two) times daily. (Patient not taking: Reported on 12/03/2022) 60 tablet 1   lidocaine (LIDODERM) 5 % Place 1 patch onto the skin every 12 (twelve) hours. Remove & Discard patch within 12 hours or as directed by MD (Patient not taking: Reported on 12/03/2022) 10 patch 0   ondansetron (ZOFRAN-ODT) 4 MG disintegrating tablet Take 1 tablet (4 mg total) by mouth every 8 (eight) hours as needed for nausea or vomiting. (Patient not taking: Reported on 10/30/2022) 20 tablet 0    Musculoskeletal: Strength & Muscle Tone: within normal limits Gait & Station: normal Patient leans: N/A       Psychiatric Specialty Exam:  Presentation  General Appearance:  Appropriate for Environment  Eye Contact: Good  Speech: Clear and Coherent  Speech Volume: Normal  Handedness: Right   Mood and Affect  Mood: -- (Calm and cooperative)  Affect: Congruent   Thought Process  Thought Processes: Coherent  Descriptions of Associations:Intact  Orientation:Full (Time, Place and Person)  Thought Content:WDL  History of Schizophrenia/Schizoaffective disorder:No  Duration of Psychotic Symptoms:No data recorded Hallucinations:Hallucinations: None  Ideas of Reference:None  Suicidal Thoughts:Suicidal Thoughts: No  Homicidal Thoughts:Homicidal Thoughts: No   Sensorium  Memory: Immediate Good; Recent Good; Remote Good  Judgment: Fair  Insight: Fair   Community education officer  Concentration: Good  Attention Span: Good  Recall: Good  Fund of Knowledge: Good  Language: Good   Psychomotor Activity  Psychomotor  Activity:Psychomotor Activity: Normal   Assets  Assets: Communication Skills; Financial Resources/Insurance; Resilience   Sleep  Sleep:Sleep: Good   Physical Exam: Physical Exam Vitals and nursing note reviewed.  Constitutional:      Appearance: Normal appearance.  HENT:     Head: Normocephalic and atraumatic.     Nose: Nose normal.  Pulmonary:     Effort: Pulmonary effort is normal.  Musculoskeletal:        General: Normal range of motion.     Cervical back: Normal range of motion.  Neurological:     General: No focal deficit present.     Mental Status: He is alert and oriented to person, place, and time.  Psychiatric:        Attention and Perception: Attention and perception normal.        Mood and Affect: Mood and affect normal.        Speech: Speech normal.        Behavior: Behavior normal. Behavior is cooperative.        Thought Content: Thought content normal. Thought content is not delusional. Thought content does not include homicidal or suicidal ideation.        Cognition and Memory: Cognition and memory normal.        Judgment: Judgment is inappropriate.    ROS Blood pressure (!) 136/97, pulse 99, temperature 97.8 F (36.6 C), resp. rate 17, height 5'  8" (1.727 m), weight 68 kg, SpO2 98 %. Body mass index is 22.79 kg/m.  Treatment Plan Summary: There is no evidence of acute psychiatric illness at this time and no evidence that he is suicidal or homicidal.  Discussed the risk of continued alcohol use and the benefits of quitting. Encouraged the patient to consider substance abuse treatment and available resources. Patient informed that in addition to outpatient therapy, there are more intensive outpatient options that do not require hospitalization. Patient declined resources at this time.  Plan reviewed with Dr. Cherylann Banas, [place].   Disposition: No evidence of imminent risk to self or others at present.   Patient does not meet criteria for psychiatric inpatient  admission. Supportive therapy provided about ongoing stressors. Discussed crisis plan, support from social network, calling 911, coming to the Emergency Department, and calling Suicide Hotline.  Ronny Flurry, NP 12/17/2022 9:45 AM

## 2022-12-17 NOTE — BH Assessment (Signed)
Comprehensive Clinical Assessment (CCA) Note  12/17/2022 Jose [name] [id number]  Chief Complaint: Patient is a 62 year old male presenting to [facility] ED voluntarily. Per triage note Pt to ED via [facility] with c/o SI. Per EMS pt with ETOH and cocaine use tonight. Per EMS pt also c/o pain everywhere. Per EMS pt endorses plan to slit his wrists. Pt arrives to triage calm and cooperative sitting in wheelchair. Pt states, "been a bad night". Per EMS pt hypertensive en route, however pt is non-compliant with home medications. During assessment patient appears alert and oriented x4, calm and cooperative, mood is depressed, speech is slurred and patient appears to be under the influence of substances. Patient rpeorts "I'm having more thoughts about killing myself, my wife is dead, she passed away 2 years ago." "I'm just tire of living." Patient also reports alcohol and cocaine use, BAL is 210 and UDS is positive for Cocaine. Patient is not able to report the amounts that he used tonight. Patient reports both SI/HI, denies AH/VH.  Per Psyc NP Anette Riedel patient to be reassessed Chief Complaint  Patient presents with   Psychiatric Evaluation   Visit Diagnosis: Polysubstance abuse, Depression    CCA Screening, Triage and Referral (STR)  Patient Reported Information How did you hear about [place]? Self  Referral name: No data recorded Referral phone number: No data recorded  Whom do you see for routine medical problems? No data recorded Practice/Facility Name: No data recorded Practice/Facility Phone Number: No data recorded Name of Contact: No data recorded Contact Number: No data recorded Contact Fax Number: No data recorded Prescriber Name: No data recorded Prescriber Address (if known): No data recorded  What Is the Reason for Your Visit/Call Today? Pt to ED via [facility] with c/o SI. Per EMS pt with ETOH and cocaine use tonight. Per EMS pt also c/o pain everywhere. Per EMS pt endorses plan to  slit his wrists. Pt arrives to triage calm and cooperative sitting in wheelchair. Pt states, "been a bad night". Per EMS pt hypertensive en route, however pt is non-compliant with home medications  How Long Has This Been Causing You Problems? > than 6 months  What Do You Feel Would Help You the Most Today? Treatment for Depression or other mood problem   Have You Recently Been in Any Inpatient Treatment (Hospital/Detox/Crisis Center/28-Day Program)? No data recorded Name/Location of Program/Hospital:No data recorded How Long Were You There? No data recorded When Were You Discharged? No data recorded  Have You Ever Received Services From [facility] Before? No data recorded Who Do You See at [facility]? No data recorded  Have You Recently Had Any Thoughts About Hurting Yourself? Yes  Are You Planning to Commit Suicide/Harm Yourself At This time? Yes   Have you Recently Had Thoughts About Hurting Someone Guadalupe Dawn? Yes  Explanation: "I want to hurt the friend that killed my friend"   Have You Used Any Alcohol or Drugs in the Past 24 Hours? Yes  How Long Ago Did You Use Drugs or Alcohol? No data recorded What Did You Use and How Much? Alcohol and Cocaine, Unknown amounts   Do You Currently Have a Therapist/Psychiatrist? No  Name of Therapist/Psychiatrist: n/a   Have You Been Recently Discharged From Any Office Practice or Programs? No  Explanation of Discharge From Practice/Program: n/a     CCA Screening Triage Referral Assessment Type of Contact: Face-to-Face  Is this Initial or Reassessment? No data recorded Date Telepsych consult ordered in [facility]:  No data recorded Time Telepsych consult ordered in [facility]:  No data recorded  Patient Reported Information Reviewed? No data recorded Patient Left Without Being Seen? No data recorded Reason for Not Completing Assessment: No data recorded  Collateral Involvement: None provided   Does Patient Have a [place]? No data recorded Name and Contact of Legal Guardian: No data recorded If Minor and Not Living with Parent(s), Who has Custody? n/a  Is CPS involved or ever been involved? Never  Is APS involved or ever been involved? Never   Patient Determined To Be At Risk for Harm To Self or Others Based on Review of Patient Reported Information or Presenting Complaint? No  Method: No Plan  Availability of Means: No access or NA  Intent: Vague intent or NA  Notification Required: No need or identified person  Additional Information for Danger to Others Potential: -- (n/a)  Additional Comments for Danger to Others Potential: n/a  Are There Guns or Other Weapons in Your Home? No  Types of Guns/Weapons: n/a  Are These Weapons Safely Secured?                            No  Who Could Verify You Are Able To Have These Secured: n/a  Do You Have any Outstanding Charges, Pending Court Dates, Parole/Probation? n/a  Contacted To Inform of Risk of Harm To Self or Others: Other: Comment   Location of Assessment: [facility] ED   Does Patient Present under Involuntary Commitment? No  IVC Papers Initial File Date: No data recorded  [place] of Residence: [place]   Patient Currently Receiving the Following Services: Not Receiving Services   Determination of Need: Emergent (2 hours)   Options For Referral: ED Visit; Therapeutic Triage Services; Chemical Dependency Intensive Outpatient Therapy (CDIOP)     CCA Biopsychosocial Intake/Chief Complaint:  No data recorded Current Symptoms/Problems: No data recorded  Patient Reported Schizophrenia/Schizoaffective Diagnosis in Past: No   Strengths: Patient is able to communicate his needs  Preferences: No data recorded Abilities: No data recorded  Type of Services Patient Feels are Needed: No data recorded  Initial Clinical Notes/Concerns: No data recorded  Mental Health Symptoms Depression:   Change in energy/activity; Difficulty  Concentrating; Fatigue; Hopelessness   Duration of Depressive symptoms:  Greater than two weeks   Mania:   None   Anxiety:    Difficulty concentrating; Fatigue   Psychosis:   None   Duration of Psychotic symptoms: No data recorded  Trauma:   None   Obsessions:   None   Compulsions:   None   Inattention:   None   Hyperactivity/Impulsivity:   None   Oppositional/Defiant Behaviors:   None   Emotional Irregularity:   None   Other Mood/Personality Symptoms:  No data recorded   Mental Status Exam Appearance and self-care  Stature:   Average   Weight:   Average weight   Clothing:   Disheveled   Grooming:   Neglected   Cosmetic use:   None   Posture/gait:   Normal   Motor activity:   Not Remarkable   Sensorium  Attention:   Normal   Concentration:   Normal   Orientation:   X5   Recall/memory:   Normal   Affect and Mood  Affect:   Appropriate   Mood:   Depressed   Relating  Eye contact:   Normal   Facial expression:   Responsive  Attitude toward examiner:   Cooperative   Thought and Language  Speech flow:  Clear and Coherent   Thought content:   Appropriate to Mood and Circumstances   Preoccupation:   None   Hallucinations:   None   Organization:  No data recorded  [employer] of Knowledge:   Fair   Intelligence:   Average   Abstraction:   Normal   Judgement:   Impaired   Reality Testing:   Adequate   Insight:   Lacking   Decision Making:   Normal   Social Functioning  Social Maturity:   Responsible   Social Judgement:   Normal   Stress  Stressors:   Housing; Teacher, music Ability:   Deficient supports   Skill Deficits:   None   Supports:   Support needed     Religion: Religion/Spirituality Are You A Religious Person?: No  Leisure/Recreation: Leisure / Recreation Do You Have Hobbies?: No  Exercise/Diet: Exercise/Diet Do You Exercise?: No Have You  Gained or Lost A Significant Amount of Weight in the Past Six Months?: No Do You Follow a Special Diet?: No Do You Have Any Trouble Sleeping?: No   CCA Employment/Education Employment/Work Situation: Employment / Work Situation Employment Situation: Unemployed Has Patient ever Been in Passenger transport manager?: No  Education: Education Did Physicist, medical?: No Did You Have An Individualized Education Program (IIEP): No Did You Have Any Difficulty At [employer]?: No Patient's Education Has Been Impacted by Current Illness: No   CCA Family/Childhood History Family and Relationship History: Family history Marital status: Single Does patient have children?: No  Childhood History:  Childhood History By whom was/is the patient raised?: Both parents Did patient suffer any verbal/emotional/physical/sexual abuse as a child?: No Has patient ever been sexually abused/assaulted/raped as an adolescent or adult?: No Was the patient ever a victim of a crime or a disaster?: No Witnessed domestic violence?: No Has patient been affected by domestic violence as an adult?: No  Child/Adolescent Assessment:     CCA Substance Use Alcohol/Drug Use: Alcohol / Drug Use Pain Medications: See PTA Prescriptions: See PTA Over the Counter: See PTA History of alcohol / drug use?: Yes Longest period of sobriety (when/how long): Unable to quantify Negative Consequences of Use: Personal relationships, Financial, Work / School Withdrawal Symptoms: Tremors Substance #1 Name of Substance 1: Alcohol                       ASAM's:  Six Dimensions of Multidimensional Assessment  Dimension 1:  Acute Intoxication and/or Withdrawal Potential:   Dimension 1:  Description of individual's past and current experiences of substance use and withdrawal: Pt is a daily, high risk drinker  Dimension 2:  Biomedical Conditions and Complications:   Dimension 2:  Description of patient's biomedical conditions and   complications: Pt has a hx of hypertension; does not take medications per chart  Dimension 3:  Emotional, Behavioral, or Cognitive Conditions and Complications:     Dimension 4:  Readiness to Change:     Dimension 5:  Relapse, Continued use, or Continued Problem Potential:     Dimension 6:  Recovery/Living Environment:     ASAM Severity Score: ASAM's Severity Rating Score: 18  ASAM Recommended Level of Treatment: ASAM Recommended Level of Treatment: Level III Residential Treatment   Substance use Disorder (SUD) Substance Use Disorder (SUD)  Checklist Symptoms of Substance Use: Continued use despite having a persistent/recurrent physical/psychological problem caused/exacerbated by use,  Continued use despite persistent or recurrent social, interpersonal problems, caused or exacerbated by use, Evidence of tolerance, Persistent desire or unsuccessful efforts to cut down or control use, Presence of craving or strong urge to use, Recurrent use that results in a failure to fulfill major role obligations (work, school, home), Social, occupational, recreational activities given up or reduced due to use, Substance(s) often taken in larger amounts or over longer times than was intended, Large amounts of time spent to obtain, use or recover from the substance(s)  Recommendations for Services/Supports/Treatments: Recommendations for Services/Supports/Treatments Recommendations For Services/Supports/Treatments: Detox, Individual Therapy, CD-IOP Intensive Chemical Dependency Program, IOP (Intensive Outpatient Program), Inpatient Hospitalization  DSM5 Diagnoses: Patient Active Problem List   Diagnosis Date Noted   Substance abuse (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   Substance induced mood disorder ([place]) 11/03/2015   Alcohol use disorder, severe, dependence ([place]) 10/24/2015   Alcohol withdrawal ([place]) 10/24/2015   Stimulant use disorder ([place]) (cocaine) 10/24/2015   Tobacco use disorder 10/24/2015   Hepatitis C 09/06/2015    Patient Centered Plan: Patient is on the following Treatment Plan(s):  Depression and Substance Abuse   Referrals to Alternative Service(s): Referred to Alternative Service(s):   Place:   Date:   Time:    Referred to Alternative Service(s):   Place:   Date:   Time:    Referred to Alternative Service(s):   Place:   Date:   Time:    Referred to Alternative Service(s):   Place:   Date:   Time:      @BHCOLLABOFCARE @  [employer], LCAS-A

## 2022-12-17 NOTE — ED Notes (Signed)
Pt given meal tray prior to discharge

## 2022-12-27 NOTE — Congregational Nurse Program (Signed)
  Dept: [phone]   Congregational Nurse Program Note  Date of Encounter: 12/27/2022 Client to [facility] with nausea/vomiting. He reports having drank both "beer and liquor" yesterday and was "very hung over". He reported he was unable to keep his coffee/juice/water down. Client was given some saltine crackers. He declined an antacid. Client encouraged to seek medical attention if his nausea/vomiting did not improve. RN offered transportation to the [facility] ED as client continued with vomiting through out the morning, but he declined. K Chauncey Mann Past Medical History: Past Medical History:  Diagnosis Date   Hep C w/o coma, chronic ([place])    Hypertension    Polysubstance abuse ([place])    Pulmonary embolism [facility])     Encounter Details:  CNP Questionnaire - 12/27/22 0850       Questionnaire   Ask client: Do you give verbal consent for me to treat you today? Yes    Student Assistance N/A    Location Patient [place]    Visit Setting with Client Organization    Patient Status Unhoused    Insurance [facility]    Insurance/Financial Assistance Referral N/A    Medication N/A    Medical Provider Yes   client was seen in July, has not rescheduled follow up that was due in October   Screening Referrals Made N/A    Medical Referrals Made N/A    Medical Appointment Made N/A    Recently w/o PCP, now 1st time PCP visit completed due to CNs referral or appointment made [place] Insecurities   food stamp reapplication taken to Social Services on 3/28   Transportation N/A   walks or has the [employer] bus system   Housing/Utilities No permanent housing   client stays off and on with friends, does at times sleep outdoors on random porches in the neighborhood   Interpersonal Safety N/A    Interventions Advocate/Support    Abnormal to Normal Screening Since Last CN Visit N/A    Screenings CN Performed N/A    Sent Client to Lab for: N/A    Did client attend  any of the following based off CNs referral or appointments made? N/A   client has been getting lunch at a small church on [address]. he is aware of the food at the [place]   ED Visit Averted N/A    Life-Saving Intervention Made N/A

## 2023-01-02 NOTE — Congregational Nurse Program (Signed)
  Dept: [phone]   Congregational Nurse Program Note  Date of Encounter: 01/02/2023 Client to [facility] day center with request for foot care. Nails trimmed, feet assessed. No open areas or sores. Lotion and clean socks applied. No other needs at this time. [name] BSN, RN Past Medical History: Past Medical History:  Diagnosis Date   Hep C w/o coma, chronic (HCC)    Hypertension    Polysubstance abuse (HCC)    Pulmonary embolism (HCC)     Encounter Details:  CNP Questionnaire - 01/02/23 1332       Questionnaire   Ask client: Do you give verbal consent for me to treat you today? Yes    Student Assistance N/A    Location Patient Served  [facility]    Visit Setting with Client Organization    Patient Status Unhoused    Insurance [facility]    Insurance/Financial Assistance Referral N/A    Medication N/A    Medical Provider Yes   client was seen in July, has not rescheduled follow up that was due in October   Screening Referrals Made N/A    Medical Referrals Made N/A    Medical Appointment Made N/A    Recently w/o PCP, now 1st time PCP visit completed due to CNs referral or appointment made N/A    Food Have Food Insecurities   food stamp reapplication taken to Social Services on 3/28   Transportation Need transportation assistance   walks or has the [employer] bus system, would need transportaion assistance for Medical appointments   Housing/Utilities No permanent housing   client stays off and on with friends, does at times sleep outdoors on random porches in the neighborhood   Interpersonal Safety N/A    Interventions Advocate/Support    Abnormal to Normal Screening Since Last CN Visit N/A    Screenings CN Performed N/A    Sent Client to Lab for: N/A    Did client attend any of the following based off CNs referral or appointments made? N/A   client has been getting lunch at a small church on [address]. he is aware of the food at the [employer] [facility] [employer] shelter   ED Visit Averted N/A    Life-Saving Intervention Made N/A

## 2023-01-03 NOTE — Congregational Nurse Program (Signed)
  Dept: [phone]   Congregational Nurse Program Note  Date of Encounter: 01/03/2023 Client to [facility] day center for continued support. Vital sign check today, BP elevated at `160/90. He continues with daily alcohol intake and cigarette smoking. He was able to shower and change clothes today, in better spirits and talkative. Support given. Rn continues to address his need to see his PCP, he continues to decline. [name] BSN, RN Past Medical History: Past Medical History:  Diagnosis Date   Hep C w/o coma, chronic (HCC)    Hypertension    Polysubstance abuse (HCC)    Pulmonary embolism (HCC)     Encounter Details:  CNP Questionnaire - 01/03/23 1256       Questionnaire   Ask client: Do you give verbal consent for me to treat you today? Yes    Student Assistance [employer] Nurse    Location Patient Served  [facility]    Visit Setting with Client Organization    Patient Status Unhoused    Insurance [facility]    Insurance/Financial Assistance Referral N/A    Medication N/A    Medical Provider Yes   client was seen in July, has not rescheduled follow up that was due in October   Screening Referrals Made N/A    Medical Referrals Made N/A    Medical Appointment Made N/A    Recently w/o PCP, now 1st time PCP visit completed due to CNs referral or appointment made N/A    Food Have Food Insecurities   food stamp reapplication taken to Social Services on 3/28   Transportation Need transportation assistance   walks or has the [employer] bus system, would need transportaion assistance for Medical appointments   Housing/Utilities No permanent housing   client stays off and on with friends, does at times sleep outdoors on random porches in the neighborhood   Interpersonal Safety N/A    Interventions Advocate/Support    Abnormal to Normal Screening Since Last CN Visit N/A    Screenings CN Performed Blood Pressure;Pulse Ox    Sent Client to Lab for: N/A    Did client attend any of the  following based off CNs referral or appointments made? N/A   client has been getting lunch at a small church on [address] he is aware of the food at the [employer] [facility] [employer] shelter   ED Visit Averted N/A    Life-Saving Intervention Made N/A

## 2023-01-06 ENCOUNTER — Other Ambulatory Visit: Payer: Self-pay

## 2023-01-06 ENCOUNTER — Emergency Department
Admission: EM | Admit: 2023-01-06 | Discharge: 2023-01-07 | Disposition: A | Discharge status: Home / Self Care | Payer: No Typology Code available for payment source | Attending: Emergency Medicine | Admitting: Emergency Medicine

## 2023-01-06 ENCOUNTER — Encounter: Payer: Self-pay | Admitting: Emergency Medicine

## 2023-01-06 DIAGNOSIS — G894 Chronic pain syndrome: Secondary | ICD-10-CM | POA: Diagnosis not present

## 2023-01-06 DIAGNOSIS — F10920 Alcohol use, unspecified with intoxication, uncomplicated: Secondary | ICD-10-CM

## 2023-01-06 DIAGNOSIS — Y908 Blood alcohol level of 240 mg/100 ml or more: Secondary | ICD-10-CM | POA: Insufficient documentation

## 2023-01-06 DIAGNOSIS — F10120 Alcohol abuse with intoxication, uncomplicated: Secondary | ICD-10-CM | POA: Diagnosis not present

## 2023-01-06 LAB — URINALYSIS, ROUTINE W REFLEX MICROSCOPIC
Bilirubin Urine: NEGATIVE
Glucose, UA: NEGATIVE mg/dL
Hgb urine dipstick: NEGATIVE
Ketones, ur: NEGATIVE mg/dL
Leukocytes,Ua: NEGATIVE
Nitrite: NEGATIVE
Protein, ur: NEGATIVE mg/dL
Specific Gravity, Urine: 1.008 (ref 1.005–1.030)
pH: 5 (ref 5.0–8.0)

## 2023-01-06 LAB — CBC
HCT: 40 % (ref 39.0–52.0)
Hemoglobin: 13.7 g/dL (ref 13.0–17.0)
MCH: 32.6 pg (ref 26.0–34.0)
MCHC: 34.3 g/dL (ref 30.0–36.0)
MCV: 95.2 fL (ref 80.0–100.0)
Platelets: 112 10*3/uL — ABNORMAL LOW (ref 150–400)
RBC: 4.2 MIL/uL — ABNORMAL LOW (ref 4.22–5.81)
RDW: 12.7 % (ref 11.5–15.5)
WBC: 6.9 10*3/uL (ref 4.0–10.5)
nRBC: 0 % (ref 0.0–0.2)

## 2023-01-06 LAB — COMPREHENSIVE METABOLIC PANEL
ALT: 136 U/L — ABNORMAL HIGH (ref 0–44)
AST: 225 U/L — ABNORMAL HIGH (ref 15–41)
Albumin: 3.9 g/dL (ref 3.5–5.0)
Alkaline Phosphatase: 84 U/L (ref 38–126)
Anion gap: 9 (ref 5–15)
BUN: 13 mg/dL (ref 6–20)
CO2: 22 mmol/L (ref 22–32)
Calcium: 9.1 mg/dL (ref 8.9–10.3)
Chloride: 104 mmol/L (ref 98–111)
Creatinine, Ser: 0.72 mg/dL (ref 0.61–1.24)
GFR, Estimated: >60 mL/min (ref >60)
Glucose, Bld: 133 mg/dL — ABNORMAL HIGH (ref 70–99)
Potassium: 3.8 mmol/L (ref 3.5–5.1)
Sodium: 135 mmol/L (ref 135–145)
Total Bilirubin: 0.8 mg/dL (ref 0.3–1.2)
Total Protein: 7.8 g/dL (ref 6.5–8.1)

## 2023-01-06 LAB — LIPASE, BLOOD: Lipase: 64 U/L — ABNORMAL HIGH (ref 11–51)

## 2023-01-06 LAB — ETHANOL: Alcohol, Ethyl (B): 368 mg/dL (ref <10)

## 2023-01-06 MED ORDER — ONDANSETRON HCL 4 MG/2ML IJ SOLN
4.0000 mg | Freq: Once | INTRAMUSCULAR | Status: AC | PRN
  Administered 2023-01-06: 4 mg via INTRAVENOUS
  Filled 2023-01-06: qty 2

## 2023-01-06 MED ORDER — KETOROLAC TROMETHAMINE 15 MG/ML IJ SOLN
15.0000 mg | Freq: Once | INTRAMUSCULAR | Status: AC
  Administered 2023-01-06: 15 mg via INTRAVENOUS
  Filled 2023-01-06: qty 1

## 2023-01-06 NOTE — ED Provider Notes (Signed)
11:50 PM  Assumed care at shift change.  Patient here with alcohol intoxication.  Also complaining of chronic neck pain.  Patient will need to metabolize prior to discharge.  Getting Toradol for neck pain.  He is ambulatory here.  Well-known to our emergency department.  12:30 AM  Pt requesting to leave.  He is upset that he is not receiving narcotic pain medication for his chronic neck pain.  It has been explained to patient multiple times that we feel this would be unsafe given his history of alcohol abuse and that we also do not prescribe narcotic pain medication for chronic pain from the ED.  He has received Toradol and has been offered other alternatives such as Tylenol, Lidoderm patch but states he would rather leave.  At this time he is ambulatory with clear speech.  He is tolerating p.o.  Medically he does not appear significantly intoxicated and can be discharged.   Janiah Devinney, Layla Maw, DO 01/07/23 0030

## 2023-01-06 NOTE — ED Triage Notes (Signed)
Pt to ED via POV with c/o emesis. Pt endorses ETOH use today, states unknown amount. Pt states he has been drinking because his best friends daughter was killed in a car wreck. Pt appears clinically intoxicated. Pt states has been throwing up "all day". Pt with noted slurring of his speech, and appears unsteady in his gait.

## 2023-01-06 NOTE — ED Provider Notes (Signed)
[facility] Provider Note    Event Date/Time   First MD Initiated Contact with Patient 01/06/23 2150     (approximate)   History   Alcohol Intoxication   HPI  Jose [name] is a 62 y.o. male with a history of alcohol abuse who presents with alcohol intoxication.  The patient reports chronic neck and knee pain and states he has been throwing up.  He is unable to give much other history.  I reviewed the past medical records for the patient and multiple prior ED visits.  He was most recently seen by psychiatry on 3/25 due to suicidal ideation and ultimately was cleared for discharge.   Physical Exam   Triage Vital Signs: ED Triage Vitals  Enc Vitals Group     BP 01/06/23 2134 128/81     Pulse Rate 01/06/23 2134 (!) 101     Resp 01/06/23 2134 16     Temp 01/06/23 2134 98.3 F (36.8 C)     Temp Source 01/06/23 2134 Oral     SpO2 01/06/23 2134 100 %     Weight 01/06/23 2130 149 lb 14.6 oz (68 kg)     Height 01/06/23 2130  (1.727 m)     Head Circumference --      Peak Flow --      Pain Score 01/06/23 2130 9     Pain Loc --      Pain Edu? --      Excl. in GC? --     Most recent vital signs: Vitals:   01/06/23 2134  BP: 128/81  Pulse: (!) 101  Resp: 16  Temp: 98.3 F (36.8 C)  SpO2: 100%     General: Awake, intoxicated appearing, no distress. CV:  Good peripheral perfusion.  Resp:  Normal effort.  Abd:  No distention.  Other:  EOMI.  PERRLA.  Motor intact in all extremities.  No midline cervical spinal tenderness.  No bony tenderness to the knees.   ED Results / Procedures / Treatments   Labs (all labs ordered are listed, but only abnormal results are displayed) Labs Reviewed  LIPASE, BLOOD - Abnormal; Notable for the following components:      Result Value   Lipase 64 (*)    All other components within normal limits  COMPREHENSIVE METABOLIC PANEL - Abnormal; Notable for the following components:   Glucose, Bld 133  (*)    AST 225 (*)    ALT 136 (*)    All other components within normal limits  URINALYSIS, ROUTINE W REFLEX MICROSCOPIC - Abnormal; Notable for the following components:   Color, Urine YELLOW (*)    APPearance CLEAR (*)    All other components within normal limits  ETHANOL - Abnormal; Notable for the following components:   Alcohol, Ethyl (B) 368 (*)    All other components within normal limits  CBC - Abnormal; Notable for the following components:   RBC 4.20 (*)    Platelets 112 (*)    All other components within normal limits     EKG    RADIOLOGY    PROCEDURES:  Critical Care performed: No  Procedures   MEDICATIONS ORDERED IN ED: Medications  ketorolac (TORADOL) 15 MG/ML injection 15 mg (has no administration in time range)  ondansetron (ZOFRAN) injection 4 mg (4 mg Intravenous Given 01/06/23 2144)  ketorolac (TORADOL) 15 MG/ML injection 15 mg (15 mg Intravenous Given 01/06/23 2213)     IMPRESSION / MDM /  ASSESSMENT AND PLAN / ED COURSE  I reviewed the triage vital signs and the nursing notes.  Differential diagnosis includes, but is not limited to, alcohol intoxication, polysubstance abuse.  The patient appears clinically intoxicated.  Will obtain basic labs, ethanol level, and observe to sobriety.  I have ordered Toradol for the patient's neck pain.  There is no evidence of acute trauma.  Patient's presentation is most consistent with acute complicated illness / injury requiring diagnostic workup.  ----------------------------------------- 11:42 PM on 01/06/2023 -----------------------------------------  Ethanol level is 368.  AST and ALT are elevated consistent with the patient's baseline.  CBC is unremarkable.  We will continue to observe for sobriety.  I have handed the patient off to the oncoming ED physician Dr. Elesa Massed.   FINAL CLINICAL IMPRESSION(S) / ED DIAGNOSES   Final diagnoses:  Alcoholic intoxication without complication     Rx / DC Orders    ED Discharge Orders     None        Note:  This document was prepared using Dragon voice recognition software and may include unintentional dictation errors.    Dionne Bucy, MD 01/06/23 [phone]

## 2023-01-07 NOTE — Discharge Instructions (Addendum)
Please go to the following website to schedule new (and existing) patient appointments:   [email or web address]   The following is a list of primary care offices in the area who are accepting new patients at this time.  Please reach out to one of them directly and let them know you would like to schedule an appointment to follow up on an Emergency Department visit, and/or to establish a new primary care provider (PCP).  There are likely other primary care clinics in the are who are accepting new patients, but this is an excellent place to start:  [facility] Lead physician: Dr Angela Bacigalupo [address] [place], [place] [address] [phone]  [facility] Lead Physician: Dr Krichna Sowles [address], [place], [place] [address] [phone]  [facility]  Lead Physician: Dr Megan Johnson [address], [place], [place] [address] [phone]  [facility] [place] [facility] Lead Physician: Dr Alex Karamalegos [address], [place], [place] [address] [phone]  [facility] [place] Lead Physician: Dr Laura Berglund [address], [place],  [address] [phone]   

## 2023-01-09 NOTE — Congregational Nurse Program (Signed)
  Dept: [phone]   Congregational Nurse Program Note  Date of Encounter: 01/09/2023 Client to [facility] day center in need of first aid to a skin tear to his right forearm. Skin cleansed with wound cleanser, skin flap approximated to cover open area. Clear dressing applied. Client educated regarding need to leave the dressing in place for the next few days, and not to remove it. Client voiced understanding. He is coming more frequently to the center intoxicated. He reports "I have cirrhosis of the liver and it wont get better" RN explained that if he stopped drinking he could improve. He continued to refute this information.  Client received his new food stamp card in the mail. RN assisted him with setting his pin and getting his balance, which was $2.91. Per his letter form Social services, he will be getting 69.00/month. His card will be reloaded on the 19th of every month. [name] BSN, RN Past Medical History: Past Medical History:  Diagnosis Date   Hep C w/o coma, chronic    Hypertension    Polysubstance abuse    Pulmonary embolism     Encounter Details:  CNP Questionnaire - 01/09/23 0914       Questionnaire   Ask client: Do you give verbal consent for me to treat you today? Yes    Student Assistance N/A    Location Patient Served  [facility]    Visit Setting with Client Organization    Patient Status Unhoused    Insurance [facility]    Insurance/Financial Assistance Referral N/A    Medication N/A    Medical Provider Yes   client was seen in July, has not rescheduled follow up that was due in October   Screening Referrals Made N/A    Medical Referrals Made N/A    Medical Appointment Made N/A    Recently w/o PCP, now 1st time PCP visit completed due to CNs referral or appointment made N/A    Food Have Food Insecurities   food stamp reapplication taken to Social Services on 3/28   Transportation Need transportation assistance   walks or has the [employer] bus system,  would need transportaion assistance for Medical appointments   Housing/Utilities No permanent housing   client stays off and on with friends, does at times sleep outdoors on random porches in the neighborhood   Interpersonal Safety N/A    Interventions Advocate/Support;Educate    Abnormal to Normal Screening Since Last CN Visit N/A    Sent Client to Lab for: N/A    Did client attend any of the following based off CNs referral or appointments made? N/A   client has been getting lunch at a small church on [address] he is aware of the food at the [employer] and [employer] shelter   ED Visit Averted N/A    Life-Saving Intervention Made N/A

## 2023-01-15 ENCOUNTER — Other Ambulatory Visit: Payer: Self-pay

## 2023-01-15 DIAGNOSIS — Z79899 Other long term (current) drug therapy: Secondary | ICD-10-CM | POA: Insufficient documentation

## 2023-01-15 DIAGNOSIS — Z7901 Long term (current) use of anticoagulants: Secondary | ICD-10-CM | POA: Insufficient documentation

## 2023-01-15 DIAGNOSIS — R1084 Generalized abdominal pain: Secondary | ICD-10-CM | POA: Diagnosis present

## 2023-01-15 DIAGNOSIS — I10 Essential (primary) hypertension: Secondary | ICD-10-CM | POA: Diagnosis not present

## 2023-01-15 DIAGNOSIS — R1110 Vomiting, unspecified: Secondary | ICD-10-CM | POA: Diagnosis not present

## 2023-01-15 DIAGNOSIS — M79606 Pain in leg, unspecified: Secondary | ICD-10-CM | POA: Diagnosis not present

## 2023-01-15 DIAGNOSIS — R7401 Elevation of levels of liver transaminase levels: Secondary | ICD-10-CM | POA: Diagnosis not present

## 2023-01-15 DIAGNOSIS — E876 Hypokalemia: Secondary | ICD-10-CM | POA: Diagnosis not present

## 2023-01-15 DIAGNOSIS — F10129 Alcohol abuse with intoxication, unspecified: Secondary | ICD-10-CM | POA: Insufficient documentation

## 2023-01-15 LAB — COMPREHENSIVE METABOLIC PANEL
ALT: 120 U/L — ABNORMAL HIGH (ref 0–44)
AST: 227 U/L — ABNORMAL HIGH (ref 15–41)
Albumin: 3.7 g/dL (ref 3.5–5.0)
Alkaline Phosphatase: 77 U/L (ref 38–126)
Anion gap: 11 (ref 5–15)
BUN: 9 mg/dL (ref 6–20)
CO2: 21 mmol/L — ABNORMAL LOW (ref 22–32)
Calcium: 9.2 mg/dL (ref 8.9–10.3)
Chloride: 99 mmol/L (ref 98–111)
Creatinine, Ser: 0.67 mg/dL (ref 0.61–1.24)
GFR, Estimated: >60 mL/min (ref >60)
Glucose, Bld: 110 mg/dL — ABNORMAL HIGH (ref 70–99)
Potassium: 3.1 mmol/L — ABNORMAL LOW (ref 3.5–5.1)
Sodium: 131 mmol/L — ABNORMAL LOW (ref 135–145)
Total Bilirubin: 1 mg/dL (ref 0.3–1.2)
Total Protein: 7.4 g/dL (ref 6.5–8.1)

## 2023-01-15 LAB — TYPE AND SCREEN
ABO/RH(D): A POS
Antibody Screen: NEGATIVE

## 2023-01-15 LAB — CBC
HCT: 40.8 % (ref 39.0–52.0)
Hemoglobin: 13.8 g/dL (ref 13.0–17.0)
MCH: 32.2 pg (ref 26.0–34.0)
MCHC: 33.8 g/dL (ref 30.0–36.0)
MCV: 95.1 fL (ref 80.0–100.0)
Platelets: 95 10*3/uL — ABNORMAL LOW (ref 150–400)
RBC: 4.29 MIL/uL (ref 4.22–5.81)
RDW: 12.4 % (ref 11.5–15.5)
WBC: 5.9 10*3/uL (ref 4.0–10.5)
nRBC: 0 % (ref 0.0–0.2)

## 2023-01-15 LAB — TROPONIN I (HIGH SENSITIVITY): Troponin I (High Sensitivity): 6 ng/L (ref <18)

## 2023-01-15 NOTE — Congregational Nurse Program (Signed)
  Dept: [phone]   Congregational Nurse Program Note  Date of Encounter: 01/15/2023 Client to [facility] Day center for first aide. Transparent dressing changed to skin tear to right forearm. Area healing well. Education regarding leaving the dressing in place for the next few days. Client voiced understanding. No other needs at this time. [name] BSN, RN Past Medical History: Past Medical History:  Diagnosis Date   Hep C w/o coma, chronic    Hypertension    Polysubstance abuse    Pulmonary embolism     Encounter Details:  CNP Questionnaire - 01/15/23 1222       Questionnaire   Ask client: Do you give verbal consent for me to treat you today? Yes    Student Assistance N/A    Location Patient Served  [facility]    Visit Setting with Client Organization    Patient Status Unhoused    Insurance [facility]    Insurance/Financial Assistance Referral N/A    Medication N/A    Medical Provider Yes   client was seen in July, has not rescheduled follow up that was due in October   Screening Referrals Made N/A    Medical Referrals Made N/A    Medical Appointment Made N/A    Recently w/o PCP, now 1st time PCP visit completed due to CNs referral or appointment made N/A    Food Have Food Insecurities   food stamp reapplication taken to Social Services on 3/28   Transportation Need transportation assistance   walks or has the [employer] bus system, would need transportaion assistance for Medical appointments   Housing/Utilities No permanent housing   client stays off and on with friends, does at times sleep outdoors on random porches in the neighborhood   Interpersonal Safety N/A    Interventions Advocate/Support    Abnormal to Normal Screening Since Last CN Visit N/A    Screenings CN Performed N/A    Sent Client to Lab for: N/A    Did client attend any of the following based off CNs referral or appointments made? N/A   client has been getting lunch at a small church on [address] he is aware of the food at the [employer] and [employer] shelter   ED Visit Averted N/A    Life-Saving Intervention Made N/A

## 2023-01-15 NOTE — ED Triage Notes (Signed)
Pt presents to ER with c/o abd pain, hematemesis, chest pain, and some chronic pain in his lower extremities.  Pt also had 2-40 oz beers today.  Pt reports these issues have been ongoing for months.  Pt reorts hx of cirrhosis.  Pt requesting pain meds in triage.  Pt is otherwise A&O x4 and in NAD in triage.

## 2023-01-16 ENCOUNTER — Emergency Department: Payer: Medicaid Other

## 2023-01-16 ENCOUNTER — Emergency Department
Admission: EM | Admit: 2023-01-16 | Discharge: 2023-01-16 | Disposition: A | Discharge status: Home / Self Care | Payer: Medicaid Other | Attending: Emergency Medicine | Admitting: Emergency Medicine

## 2023-01-16 DIAGNOSIS — R1084 Generalized abdominal pain: Secondary | ICD-10-CM

## 2023-01-16 MED ORDER — THIAMINE MONONITRATE 100 MG PO TABS: 100.0000 mg | ORAL_TABLET | Freq: Every day | ORAL | Status: DC

## 2023-01-16 MED ORDER — LORAZEPAM 1 MG PO TABS: 1.0000 mg | ORAL_TABLET | ORAL | Status: DC | PRN

## 2023-01-16 MED ORDER — FAMOTIDINE 20 MG PO TABS
20.0000 mg | ORAL_TABLET | Freq: Once | ORAL | Status: AC
  Administered 2023-01-16: 20 mg via ORAL
  Filled 2023-01-16: qty 1

## 2023-01-16 MED ORDER — PANTOPRAZOLE SODIUM 40 MG PO TBEC: 40.0000 mg | DELAYED_RELEASE_TABLET | Freq: Every day | ORAL | 3 refills | Status: DC

## 2023-01-16 MED ORDER — FOLIC ACID 1 MG PO TABS: 1.0000 mg | ORAL_TABLET | Freq: Every day | ORAL | Status: DC

## 2023-01-16 MED ORDER — LIDOCAINE VISCOUS HCL 2 % MT SOLN
15.0000 mL | Freq: Once | OROMUCOSAL | Status: AC
  Administered 2023-01-16: 15 mL via ORAL
  Filled 2023-01-16: qty 15

## 2023-01-16 MED ORDER — ADULT MULTIVITAMIN W/MINERALS CH: 1.0000 | ORAL_TABLET | Freq: Every day | ORAL | Status: DC

## 2023-01-16 MED ORDER — THIAMINE HCL 100 MG/ML IJ SOLN: 100.0000 mg | Freq: Every day | INTRAMUSCULAR | Status: DC

## 2023-01-16 MED ORDER — POTASSIUM CHLORIDE CRYS ER 20 MEQ PO TBCR
40.0000 meq | EXTENDED_RELEASE_TABLET | Freq: Once | ORAL | Status: AC
  Administered 2023-01-16: 40 meq via ORAL
  Filled 2023-01-16: qty 2

## 2023-01-16 MED ORDER — ALUM & MAG HYDROXIDE-SIMETH 200-200-20 MG/5ML PO SUSP
30.0000 mL | Freq: Once | ORAL | Status: AC
  Administered 2023-01-16: 30 mL via ORAL
  Filled 2023-01-16: qty 30

## 2023-01-16 NOTE — ED Notes (Signed)
ED Provider at bedside. 

## 2023-01-16 NOTE — Discharge Instructions (Addendum)
Take Protonix daily as prescribed.  I made a referral to gastroenterology for further workup of your abdominal pain.  Your regular doctor for a follow-up appointment this week.  If you do not have a regular doctor I made a referral for you who will be calling you to establish care.   If you develop any new, worsening, unexpected symptoms come back to the emergency department for reevaluation.

## 2023-01-16 NOTE — ED Notes (Signed)
PT brought to ed rm 14 at this time, this RN now assuming care.  

## 2023-01-16 NOTE — ED Provider Notes (Signed)
[facility] Provider Note    Event Date/Time   First MD Initiated Contact with Patient 01/16/23 0206     (approximate)   History   Abdominal Pain   HPI  Jose [name] is a 62 y.o. male   Past medical history of hepatitis C, polysubstance use, PE on Eliquis, hypertension, alcohol use, who presents to the emergency department with abdominal pain onset earlier today.  He drank just prior to arrival, 2x40 ounce steel reserve malt beverages.  Diffuse pain.  In triage reported emesis and chest pain as well as leg pain but for me he denies these complaints and states only abdominal pain.  He denies any GU symptoms.  He denies diarrhea or GI bleeding.   External Medical Documents Reviewed: Emergency department visit dated 01/06/2023 for alcohol intoxication with chronic neck and knee pain      Physical Exam   Triage Vital Signs: ED Triage Vitals  Enc Vitals Group     BP 01/15/23 2250 126/85     Pulse Rate 01/15/23 2250 92     Resp 01/15/23 2250 20     Temp 01/15/23 2250 97.7 F (36.5 C)     Temp Source 01/15/23 2250 Oral     SpO2 01/15/23 2250 98 %     Weight --      Height --      Head Circumference --      Peak Flow --      Pain Score 01/15/23 2255 10     Pain Loc --      Pain Edu? --      Excl. in GC? --     Most recent vital signs: Vitals:   01/15/23 2250 01/16/23 0215  BP: 126/85 (!) 151/95  Pulse: 92 90  Resp: 20 11  Temp: 97.7 F (36.5 C)   SpO2: 98% 97%    General: Awake, no distress.  CV:  Good peripheral perfusion.  Resp:  Normal effort.  Abd:  No distention.  Other:  Intoxicated appearing with slurred speech, reports diffuse abdominal pain when I palpate his abdomen which is soft and not rigid.  Lungs clear.   ED Results / Procedures / Treatments   Labs (all labs ordered are listed, but only abnormal results are displayed) Labs Reviewed  CBC - Abnormal; Notable for the following components:      Result Value    Platelets 95 (*)    All other components within normal limits  COMPREHENSIVE METABOLIC PANEL - Abnormal; Notable for the following components:   Sodium 131 (*)    Potassium 3.1 (*)    CO2 21 (*)    Glucose, Bld 110 (*)    AST 227 (*)    ALT 120 (*)    All other components within normal limits  TYPE AND SCREEN  TROPONIN I (HIGH SENSITIVITY)     I ordered and reviewed the above labs they are notable for mild hypokalemia at 3.1, LFTs are chronically elevated and at baseline AST 200s and ALT 100s and initial troponin is negative  EKG  ED ECG REPORT I, Pilar Jarvis, the attending physician, personally viewed and interpreted this ECG.   Date: 01/16/2023  EKG Time: 2253  Rate: 100  Rhythm: sinus tachycardia  Axis: nl  Intervals:rbbb  ST&T Change: no stemi    RADIOLOGY I independently reviewed and interpreted CT of the abdomen pelvis see no obvious obstructive or inflammatory changes.   PROCEDURES:  Critical Care performed: No  Procedures   MEDICATIONS ORDERED IN ED: Medications  LORazepam (ATIVAN) tablet 1-4 mg (has no administration in time range)  thiamine (VITAMIN B1) tablet 100 mg (has no administration in time range)    Or  thiamine (VITAMIN B1) injection 100 mg (has no administration in time range)  folic acid (FOLVITE) tablet 1 mg (has no administration in time range)  multivitamin with minerals tablet 1 tablet (has no administration in time range)  potassium chloride SA (KLOR-CON M) CR tablet 40 mEq (40 mEq Oral Given 01/16/23 0218)  famotidine (PEPCID) tablet 20 mg (20 mg Oral Given 01/16/23 0218)  alum & mag hydroxide-simeth (MAALOX/MYLANTA) 200-200-20 MG/5ML suspension 30 mL (30 mLs Oral Given 01/16/23 0219)    And  lidocaine (XYLOCAINE) 2 % viscous mouth solution 15 mL (15 mLs Oral Given 01/16/23 0219)     IMPRESSION / MDM / ASSESSMENT AND PLAN / ED COURSE  I reviewed the triage vital signs and the nursing notes.                                 Patient's presentation is most consistent with acute presentation with potential threat to life or bodily function.  Differential diagnosis includes, but is not limited to, intra-abdominal infection like cholecystitis or appendicitis, obstruction, diverticulitis, GERD, gastric ulcer, perforated viscus   The patient is on the cardiac monitor to evaluate for evidence of arrhythmia and/or significant heart rate changes.  MDM: Patient with alcohol intoxication alcohol use and abdominal pain with some tenderness to palpation.  Check CT scan.  Give medications for gastritis including Pepcid and Maalox cocktail.  Doubt ACS given no complaints of chest pain initial troponin negative and nonischemic EKG.  If CT scan is negative for emergent pathologies plan will be for discharge and started on a PPI and referral to GI.      FINAL CLINICAL IMPRESSION(S) / ED DIAGNOSES   Final diagnoses:  Generalized abdominal pain     Rx / DC Orders   ED Discharge Orders          Ordered    pantoprazole (PROTONIX) 40 MG tablet  Daily        01/16/23 0234    Ambulatory referral to Gastroenterology        01/16/23 0235             Note:  This document was prepared using Dragon voice recognition software and may include unintentional dictation errors.    Pilar Jarvis, MD 01/16/23 [phone]

## 2023-01-16 NOTE — ED Notes (Signed)
Pt given a sanwhich tray and lemon soda per pt request.
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
## 2023-02-05 NOTE — Congregational Nurse Program (Signed)
  Dept: [phone]   Congregational Nurse Program Note  Date of Encounter: 02/05/2023 Client to [facility] day center with request for foot care. Nails trimmed. No open or sore areas. No nail fungus. Client appreciative of care. K Levander Campion Past Medical History: Past Medical History:  Diagnosis Date   Hep C w/o coma, chronic (HCC)    Hypertension    Polysubstance abuse (HCC)    Pulmonary embolism (HCC)     Encounter Details:  CNP Questionnaire - 02/05/23 1349       Questionnaire   Ask client: Do you give verbal consent for me to treat you today? Yes    Student Assistance N/A    Location Patient Served  [facility]    Visit Setting with Client Organization    Patient Status Unhoused    Insurance [facility]    Insurance/Financial Assistance Referral N/A    Medication N/A    Medical Provider Yes   client was seen in July 2023, has not rescheduled follow up that was due in October   Screening Referrals Made N/A    Medical Referrals Made N/A    Medical Appointment Made N/A    Recently w/o PCP, now 1st time PCP visit completed due to CNs referral or appointment made N/A    Food Have Food Insecurities   food stamp reapplication taken to Social Services on 3/28   Transportation N/A   walks or has the [employer] bus system, would need transportaion assistance for Medical appointments   Housing/Utilities No permanent housing   client stays off and on with friends, does at times sleep outdoors on random porches in the neighborhood   Interpersonal Safety N/A    Interventions Advocate/Support    Abnormal to Normal Screening Since Last CN Visit N/A    Screenings CN Performed N/A    Sent Client to Lab for: N/A    Did client attend any of the following based off CNs referral or appointments made? N/A   client has been getting lunch at a small church on [address] he is aware of the food at the [employer] and [employer] shelter   ED Visit Averted N/A    Life-Saving  Intervention Made N/A

## 2023-02-06 NOTE — Congregational Nurse Program (Signed)
  Dept: [phone]   Congregational Nurse Program Note  Date of Encounter: 02/06/2023 Client to [facility] day center for support and vital sign check.BP 120/68 (BP Location: Left Arm, Patient Position: Sitting, Cuff Size: Normal)   Pulse 94   SpO2 94%  K Merilynn Finland BSN, RN   Past Medical History: Past Medical History:  Diagnosis Date   Hep C w/o coma, chronic (HCC)    Hypertension    Polysubstance abuse (HCC)    Pulmonary embolism (HCC)     Encounter Details:  CNP Questionnaire - 02/06/23 1200       Questionnaire   Ask client: Do you give verbal consent for me to treat you today? Yes    Student Assistance N/A    Location Patient Served  [facility]    Visit Setting with Client Organization    Patient Status Unhoused    Insurance [facility]    Insurance/Financial Assistance Referral N/A    Medication N/A    Medical Provider Yes   client was seen in July 2023, has not rescheduled follow up that was due in October   Screening Referrals Made N/A    Medical Referrals Made N/A    Medical Appointment Made N/A    Recently w/o PCP, now 1st time PCP visit completed due to CNs referral or appointment made N/A    Food Have Food Insecurities   food stamp reapplication taken to Social Services on 3/28   Transportation N/A   walks or has the [employer] bus system, would need transportaion assistance for Medical appointments   Housing/Utilities No permanent housing   client stays off and on with friends, does at times sleep outdoors on random porches in the neighborhood   Interpersonal Safety N/A    Interventions Advocate/Support    Abnormal to Normal Screening Since Last CN Visit N/A    Screenings CN Performed Blood Pressure;Pulse Ox    Sent Client to Lab for: N/A    Did client attend any of the following based off CNs referral or appointments made? N/A   client has been getting lunch at a small church on [address] he is aware of the food at the [employer] and [employer]  shelter   ED Visit Averted N/A    Life-Saving Intervention Made N/A

## 2023-02-12 NOTE — Congregational Nurse Program (Signed)
  Dept: [phone]   Congregational Nurse Program Note  Date of Encounter: 02/12/2023 Client to [facility] with request for sunscreen. 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 He has been given hats and and education regarding the need for sun protection. Sunscreen applied to face and tube given. Instructions on reapplying several times a day given. Client voiced understanding. [name] BSN, RN Past Medical History: Past Medical History:  Diagnosis Date   Hep C w/o coma, chronic (HCC)    Hypertension    Polysubstance abuse (HCC)    Pulmonary embolism (HCC)     Encounter Details:  CNP Questionnaire - 02/12/23 1310       Questionnaire   Ask client: Do you give verbal consent for me to treat you today? Yes    Student Assistance N/A    Location Patient Served  [facility]    Visit Setting with Client Organization    Patient Status Unhoused    Insurance [facility]    Insurance/Financial Assistance Referral N/A    Medication N/A    Medical Provider Yes   client was seen in July 2023, has not rescheduled follow up that was due in October   Screening Referrals Made N/A    Medical Referrals Made N/A    Medical Appointment Made N/A    Recently w/o PCP, now 1st time PCP visit completed due to CNs referral or appointment made N/A    Food Have Food Insecurities   food stamp reapplication taken to Social Services on 3/28   Transportation N/A   walks or has the [employer] bus system, would need transportaion assistance for Medical appointments   Housing/Utilities No permanent housing   client stays off and on with friends, does at times sleep outdoors on random porches in the neighborhood   Interpersonal Safety N/A    Interventions Advocate/Support    Abnormal to Normal Screening Since Last CN Visit N/A    Screenings CN Performed N/A    Sent Client to Lab for: N/A    Did client attend any of the following based off CNs referral or appointments made? N/A   client  has been getting lunch at a small church on [address] he is aware of the food at the [employer] and [employer] shelter   ED Visit Averted N/A    Life-Saving Intervention Made N/A

## 2023-02-14 NOTE — Congregational Nurse Program (Signed)
  Dept: [phone]   Congregational Nurse Program Note  Date of Encounter: 02/14/2023 Client to [facility] for support and vital sign check. BP 138/88 (BP Location: Left Arm, Patient Position: Sitting, Cuff Size: Normal)   Pulse 78   SpO2 96%  Education again provided regarding sun protection and use of sun screen. [name] BSN, RN  Past Medical History: Past Medical History:  Diagnosis Date   Hep C w/o coma, chronic (HCC)    Hypertension    Polysubstance abuse (HCC)    Pulmonary embolism (HCC)     Encounter Details:  CNP Questionnaire - 02/14/23 1302       Questionnaire   Ask client: Do you give verbal consent for me to treat you today? Yes    Student Assistance N/A    Location Patient Served  [facility]    Visit Setting with Client Organization    Patient Status Unhoused    Insurance [facility]    Insurance/Financial Assistance Referral N/A    Medication N/A    Medical Provider Yes   client was seen in July 2023, has not rescheduled follow up that was due in October   Screening Referrals Made N/A    Medical Referrals Made N/A    Medical Appointment Made N/A    Recently w/o PCP, now 1st time PCP visit completed due to CNs referral or appointment made N/A    Food Have Food Insecurities   replacement food stamp card has been received   Transportation N/A   walks or [place] [employer] [place], would need transportaion assistance for Medical appointments   Housing/Utilities No permanent housing   client stays off and on with friends, does at times sleep outdoors on random porches in the neighborhood   Interpersonal Safety N/A    Interventions Advocate/Support    Abnormal to Normal Screening Since Last CN Visit N/A    Screenings CN Performed Blood Pressure;Pulse Ox    Sent Client to Lab for: N/A    Did client attend any of the following based off CNs referral or appointments made? N/A   client has been getting lunch at a small church on [address] he  is aware of the food at the [employer] and [employer] shelter   ED Visit Averted N/A    Life-Saving Intervention Made N/A

## 2023-02-19 NOTE — Congregational Nurse Program (Signed)
  Dept: [phone]   Congregational Nurse Program Note  Date of Encounter: 02/19/2023 Client to [facility] for emotional support and first aid. First aid given to small open wounds to the base of his right thumb and left inner wrist. 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 No other needs at this time. [name] BSN, RN Past Medical History: Past Medical History:  Diagnosis Date   Hep C w/o coma, chronic (HCC)    Hypertension    Polysubstance abuse (HCC)    Pulmonary embolism (HCC)     Encounter Details:  CNP Questionnaire - 02/19/23 1144       Questionnaire   Ask client: Do you give verbal consent for me to treat you today? Yes    Student Assistance N/A    Location Patient Served  [facility]    Visit Setting with Client Organization    Patient Status Unhoused    Insurance [facility]    Insurance/Financial Assistance Referral N/A    Medication N/A    Medical Provider Yes   client was seen in July 2023, has not rescheduled follow up that was due in October   Screening Referrals Made N/A    Medical Referrals Made N/A    Medical Appointment Made N/A    Recently w/o PCP, now 1st time PCP visit completed due to CNs referral or appointment made N/A    Food Have Food Insecurities   replacement food stamp card has been received   Transportation N/A   walks or [place] [employer] [place], would need transportaion assistance for Medical appointments   Housing/Utilities No permanent housing   client stays off and on with friends, does at times sleep outdoors on random porches in the neighborhood   Interpersonal Safety N/A    Interventions Advocate/Support    Abnormal to Normal Screening Since Last CN Visit N/A    Screenings CN Performed N/A    Sent Client to Lab for: N/A    Did client attend any of the following based off CNs referral or appointments made? N/A   client has been getting lunch at a small church on [address] he is aware of the food at  the [employer] and [employer] shelter   ED Visit Averted N/A    Life-Saving Intervention Made N/A

## 2023-02-27 IMAGING — CT CT ABD-PELV W/ CM
2 of 5 series · 16 of 46 positions shown, 18 images · IV contrast (APPLIED)
Comparison: CT AP, 09/25/2021 and 08/16/2021.

CLINICAL DATA: Abdominal pain.

EXAM:
CT ABDOMEN AND PELVIS WITH CONTRAST
TECHNIQUE: Multidetector CT imaging of the abdomen and pelvis was performed
using the standard protocol following bolus administration of
intravenous contrast.

[Series 2: routine abd/pel with · axial · 0.80mm/px · z∈[-788,-388]mm · 13 of 90 slices shown, 15 images]
[im 5/90  soft-tissue]
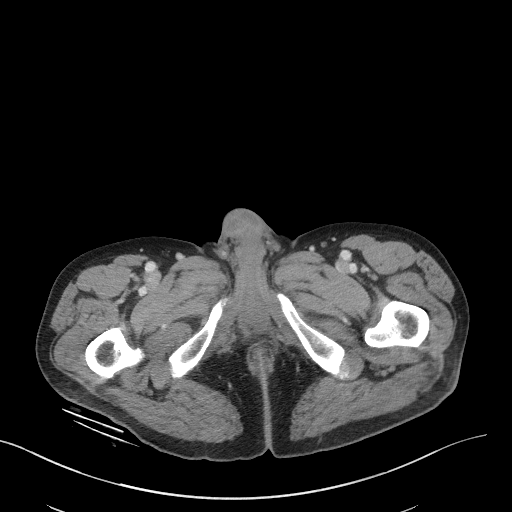
[im 5/90  bone]
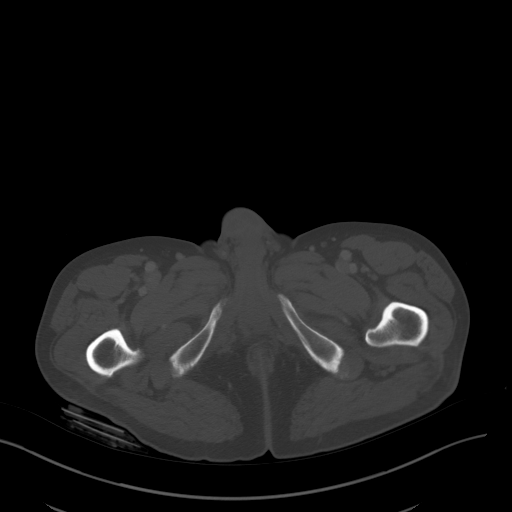
[im 10/90  soft-tissue]
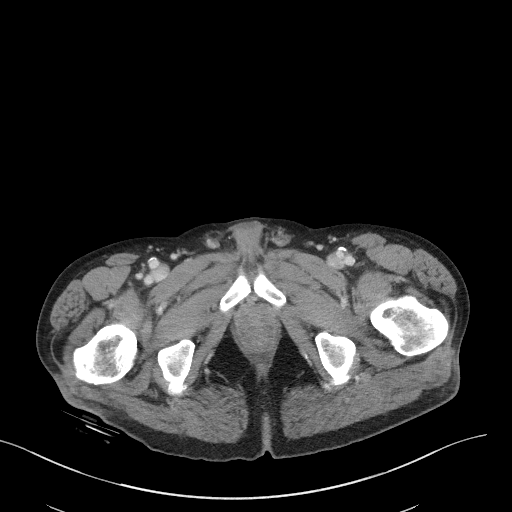
[im 20/90  soft-tissue]
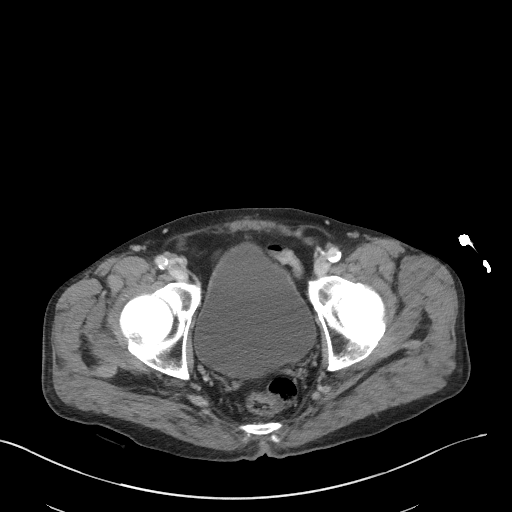
[im 25/90  soft-tissue]
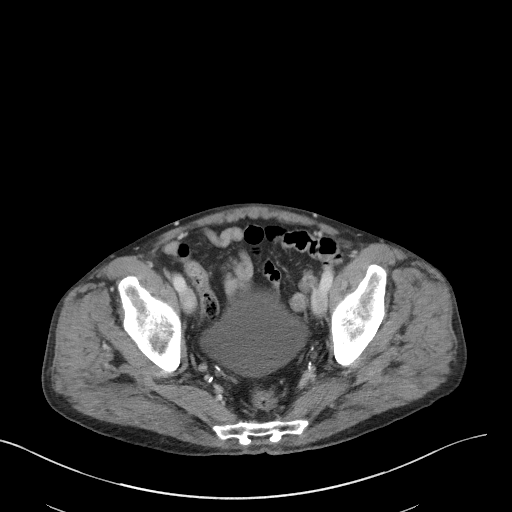
[im 30/90  soft-tissue]
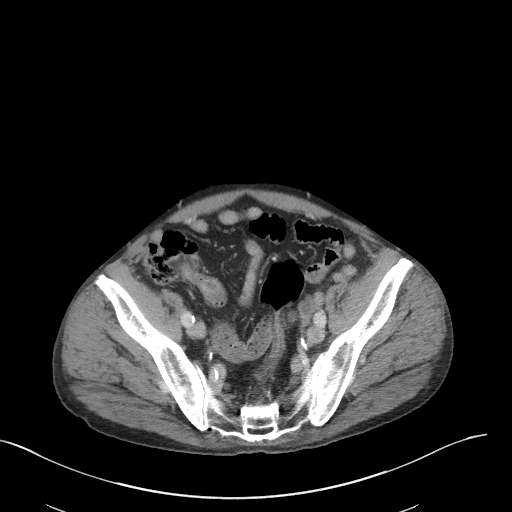
[im 40/90  soft-tissue]
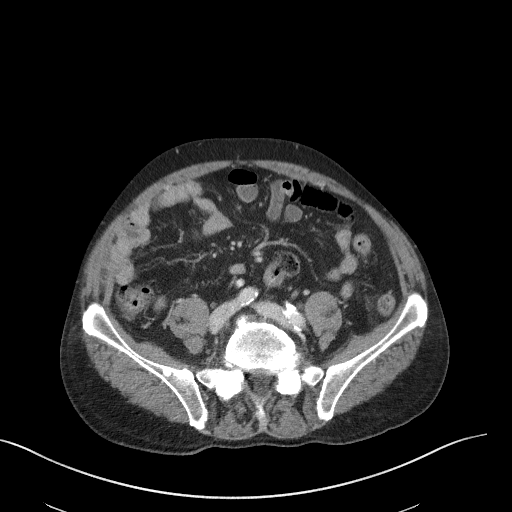
[im 45/90  soft-tissue]
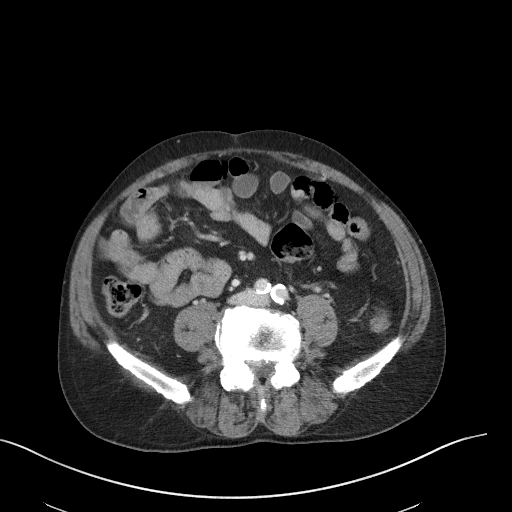
[im 50/90  soft-tissue]
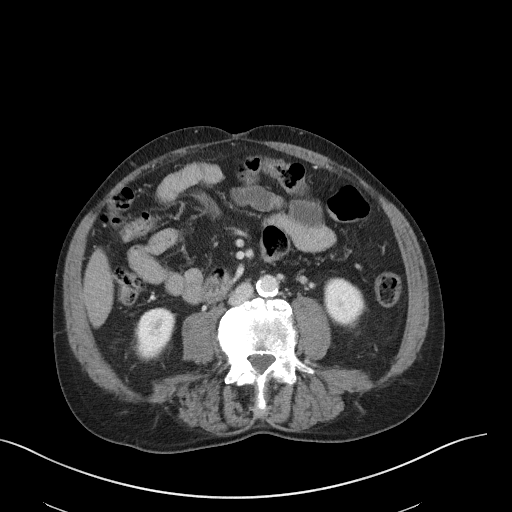
[im 60/90  soft-tissue]
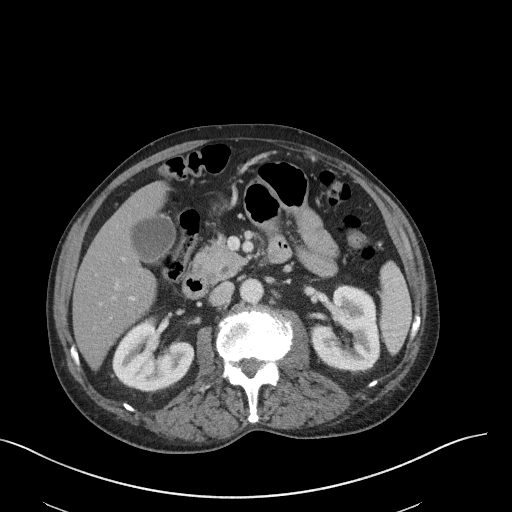
[im 60/90  bone]
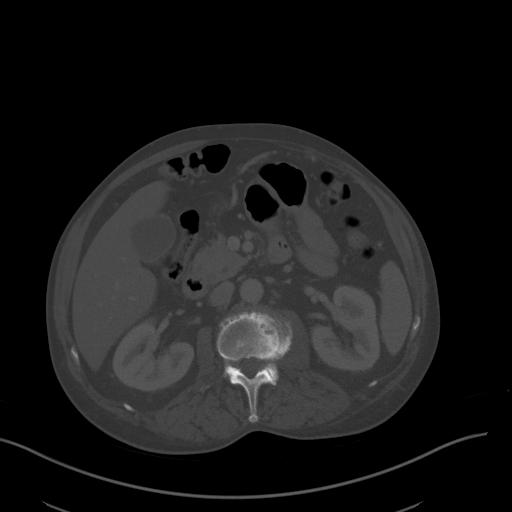
[im 65/90  soft-tissue]
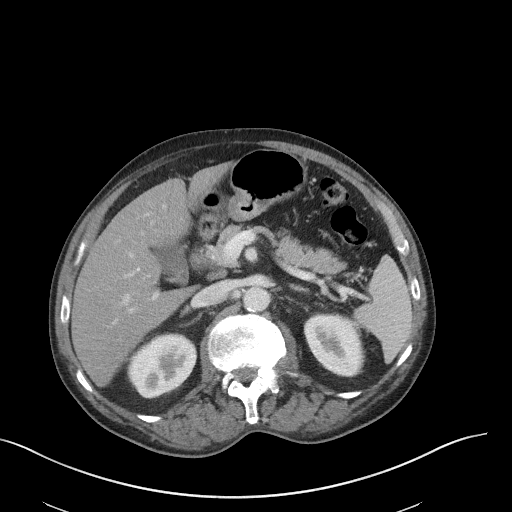
[im 70/90  soft-tissue]
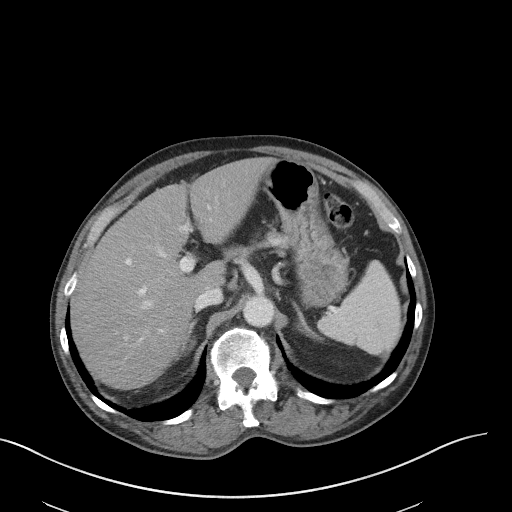
[im 80/90  soft-tissue]
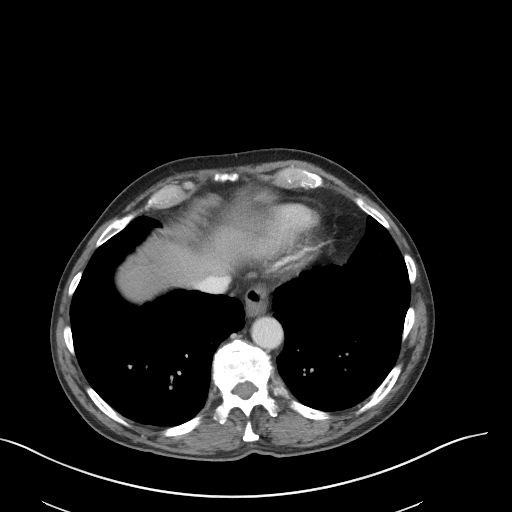
[im 85/90  soft-tissue]
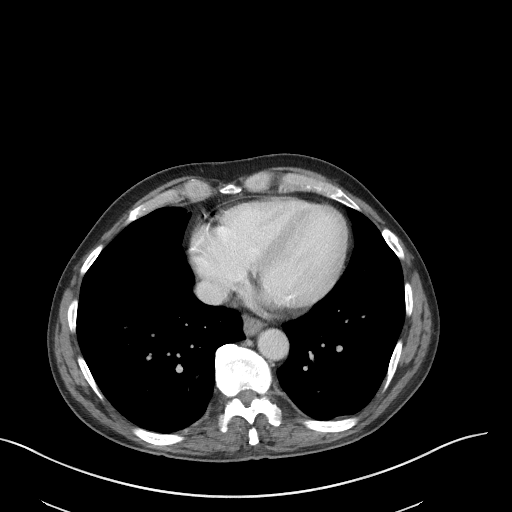

[Series 5: coronal st · coronal · 0.70mm/px · 3 of 92 slices shown]
[im 31/92  soft-tissue]
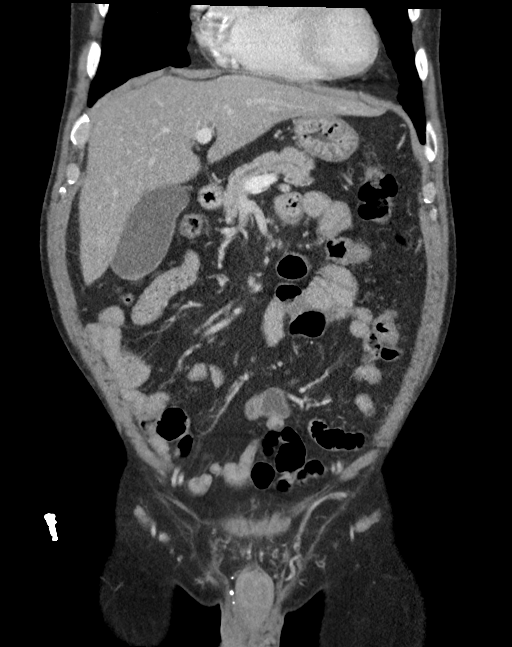
[im 41/92  soft-tissue]
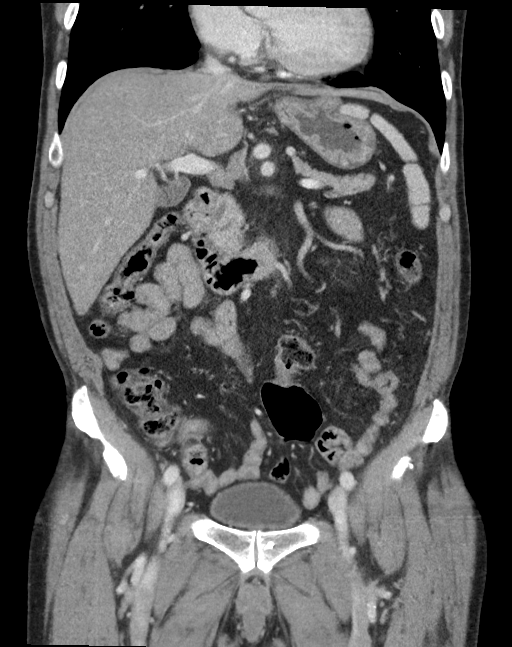
[im 51/92  soft-tissue]
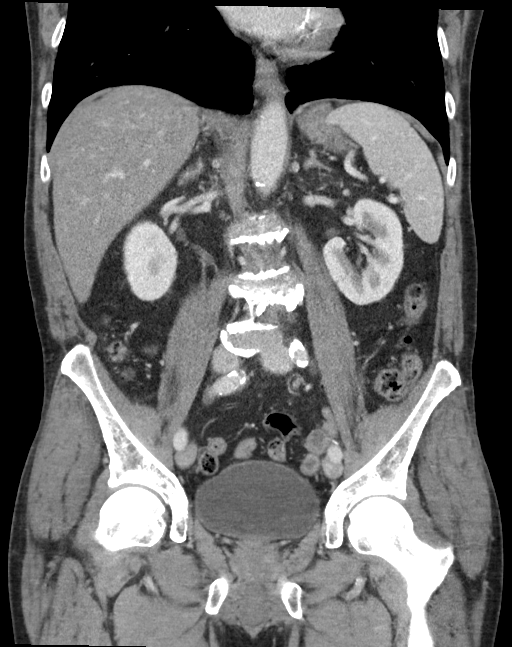

[16 of 46 positions shown; findings below may reference images not displayed]

RADIATION DOSE REDUCTION: This exam was performed according to the
departmental dose-optimization program which includes automated
exposure control, adjustment of the mA and/or kV according to
patient size and/or use of iterative reconstruction technique.

CONTRAST:  100mL OMNIPAQUE IOHEXOL 300 MG/ML  SOLN
FINDINGS: Lower chest: No acute abnormality. Mitral valve and coronary
calcifications

Hepatobiliary: No focal liver abnormality is seen. No gallstones,
gallbladder wall thickening, or biliary dilatation.

Pancreas: No pancreatic ductal dilatation or surrounding
inflammatory changes.

Spleen: Normal in size without focal abnormality.

Adrenals/Urinary Tract: Adrenal glands are unremarkable. Kidneys are
normal, without renal calculi, focal lesion, or hydronephrosis.
Bladder is unremarkable.

Stomach/Bowel: Small hiatus hernia. Stomach is otherwise within
normal limits. Appendix is not definitively visualized. No evidence
of bowel wall thickening, distention, or inflammatory changes.

Vascular/Lymphatic: Aortic atherosclerosis. No enlarged abdominal or
pelvic lymph nodes.

Reproductive: Prostate is unremarkable.

Other: Small fat-containing umbilical hernia and inguinal hernias
versus cord lipomas. No abdominopelvic ascites.

Musculoskeletal: Spinal degenerative change. No acute osseous
findings.

No follow-up is indicated for incidental findings, unless
specifically mentioned.
IMPRESSION: 1. No acute abdominopelvic process
2. Aortic Atherosclerosis ([id number]). Additional incidental,
chronic and senescent findings as above.

## 2023-02-27 IMAGING — CR DG CHEST 2V
2 series · 2 of 2 positions shown · non-contrast
Comparison: Chest XR, 12/30/2021.  CT chest, 12/30/2021.

CLINICAL DATA: Chest pain.  Pt states he used cocaine last night

EXAM:
CHEST - 2 VIEW

[chest pa]
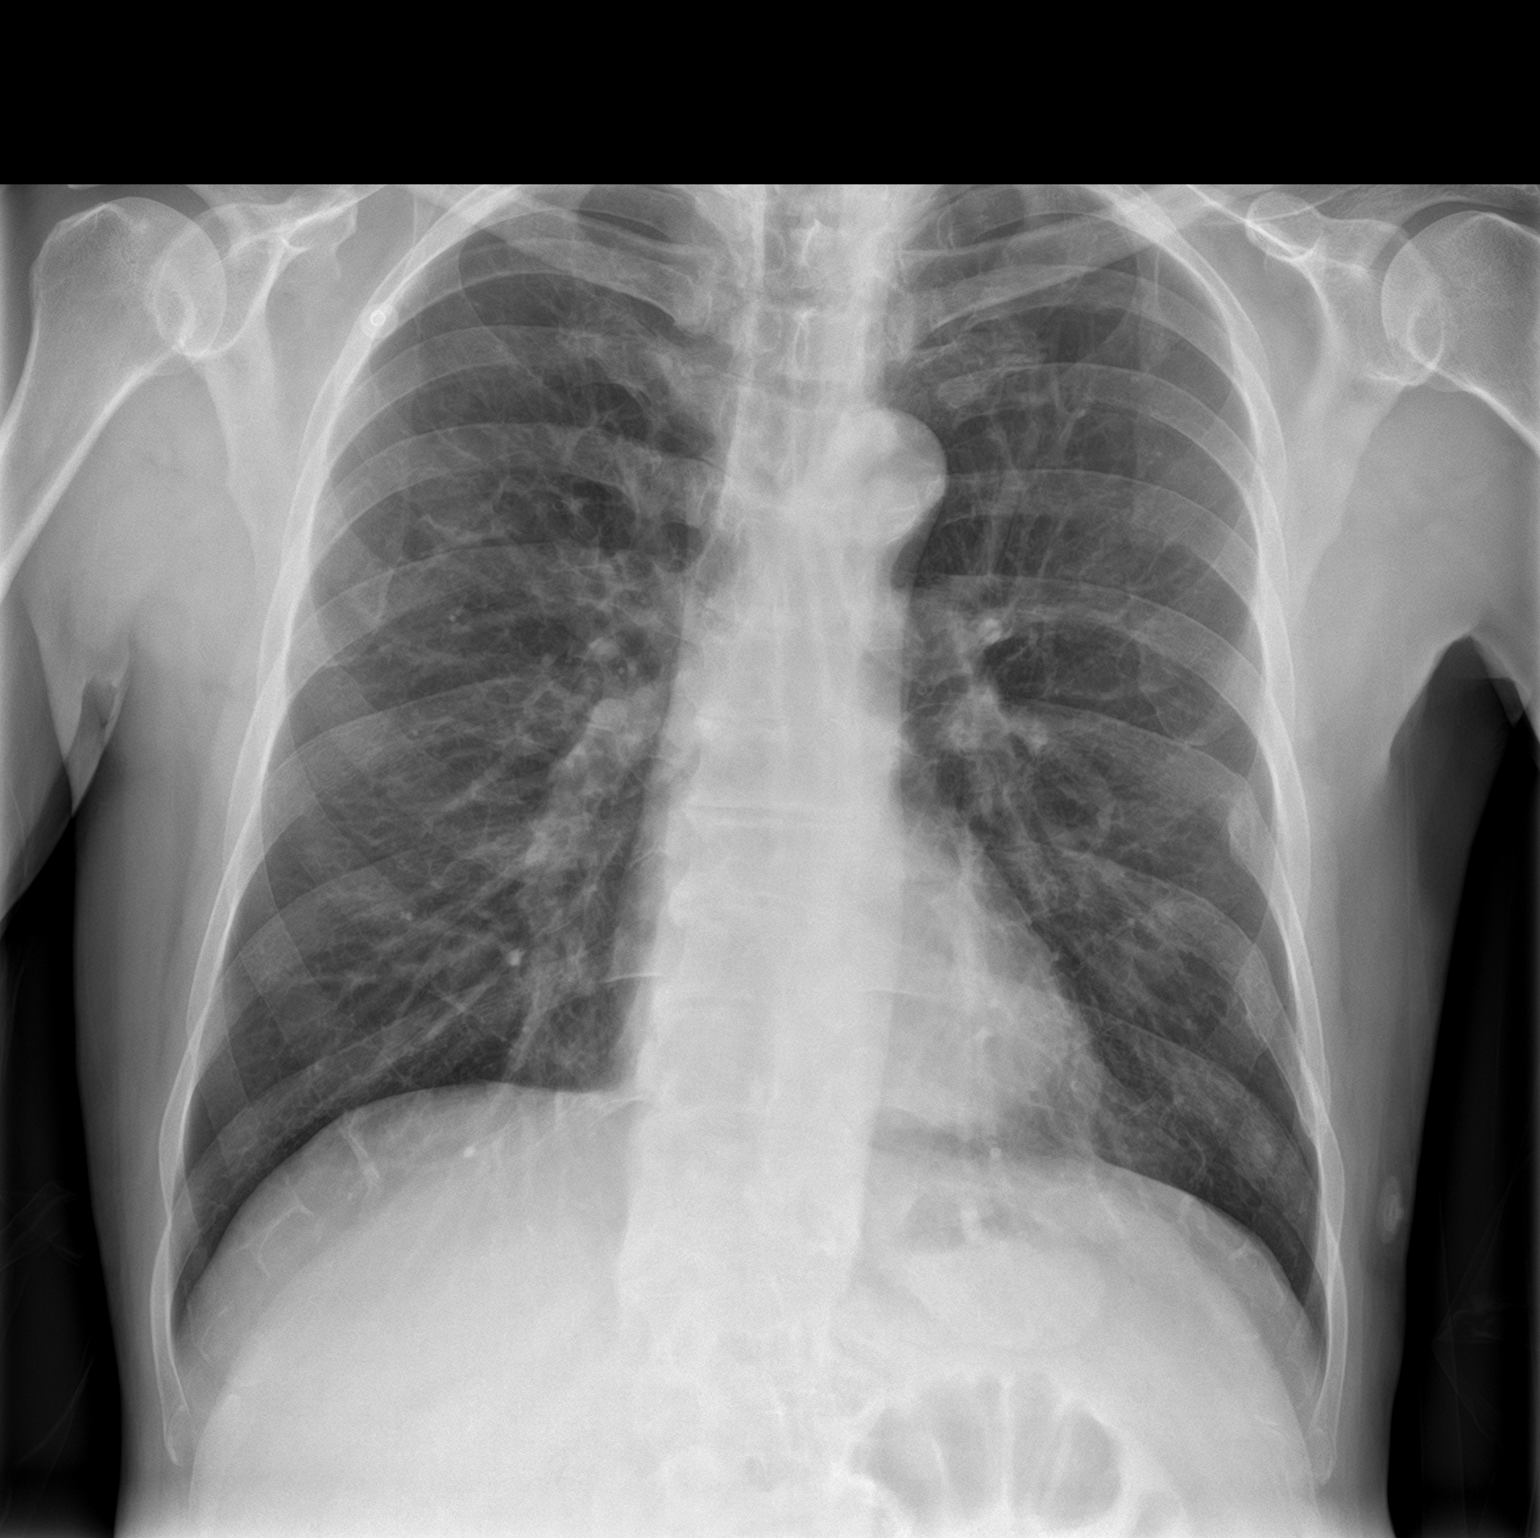

[chest lat]
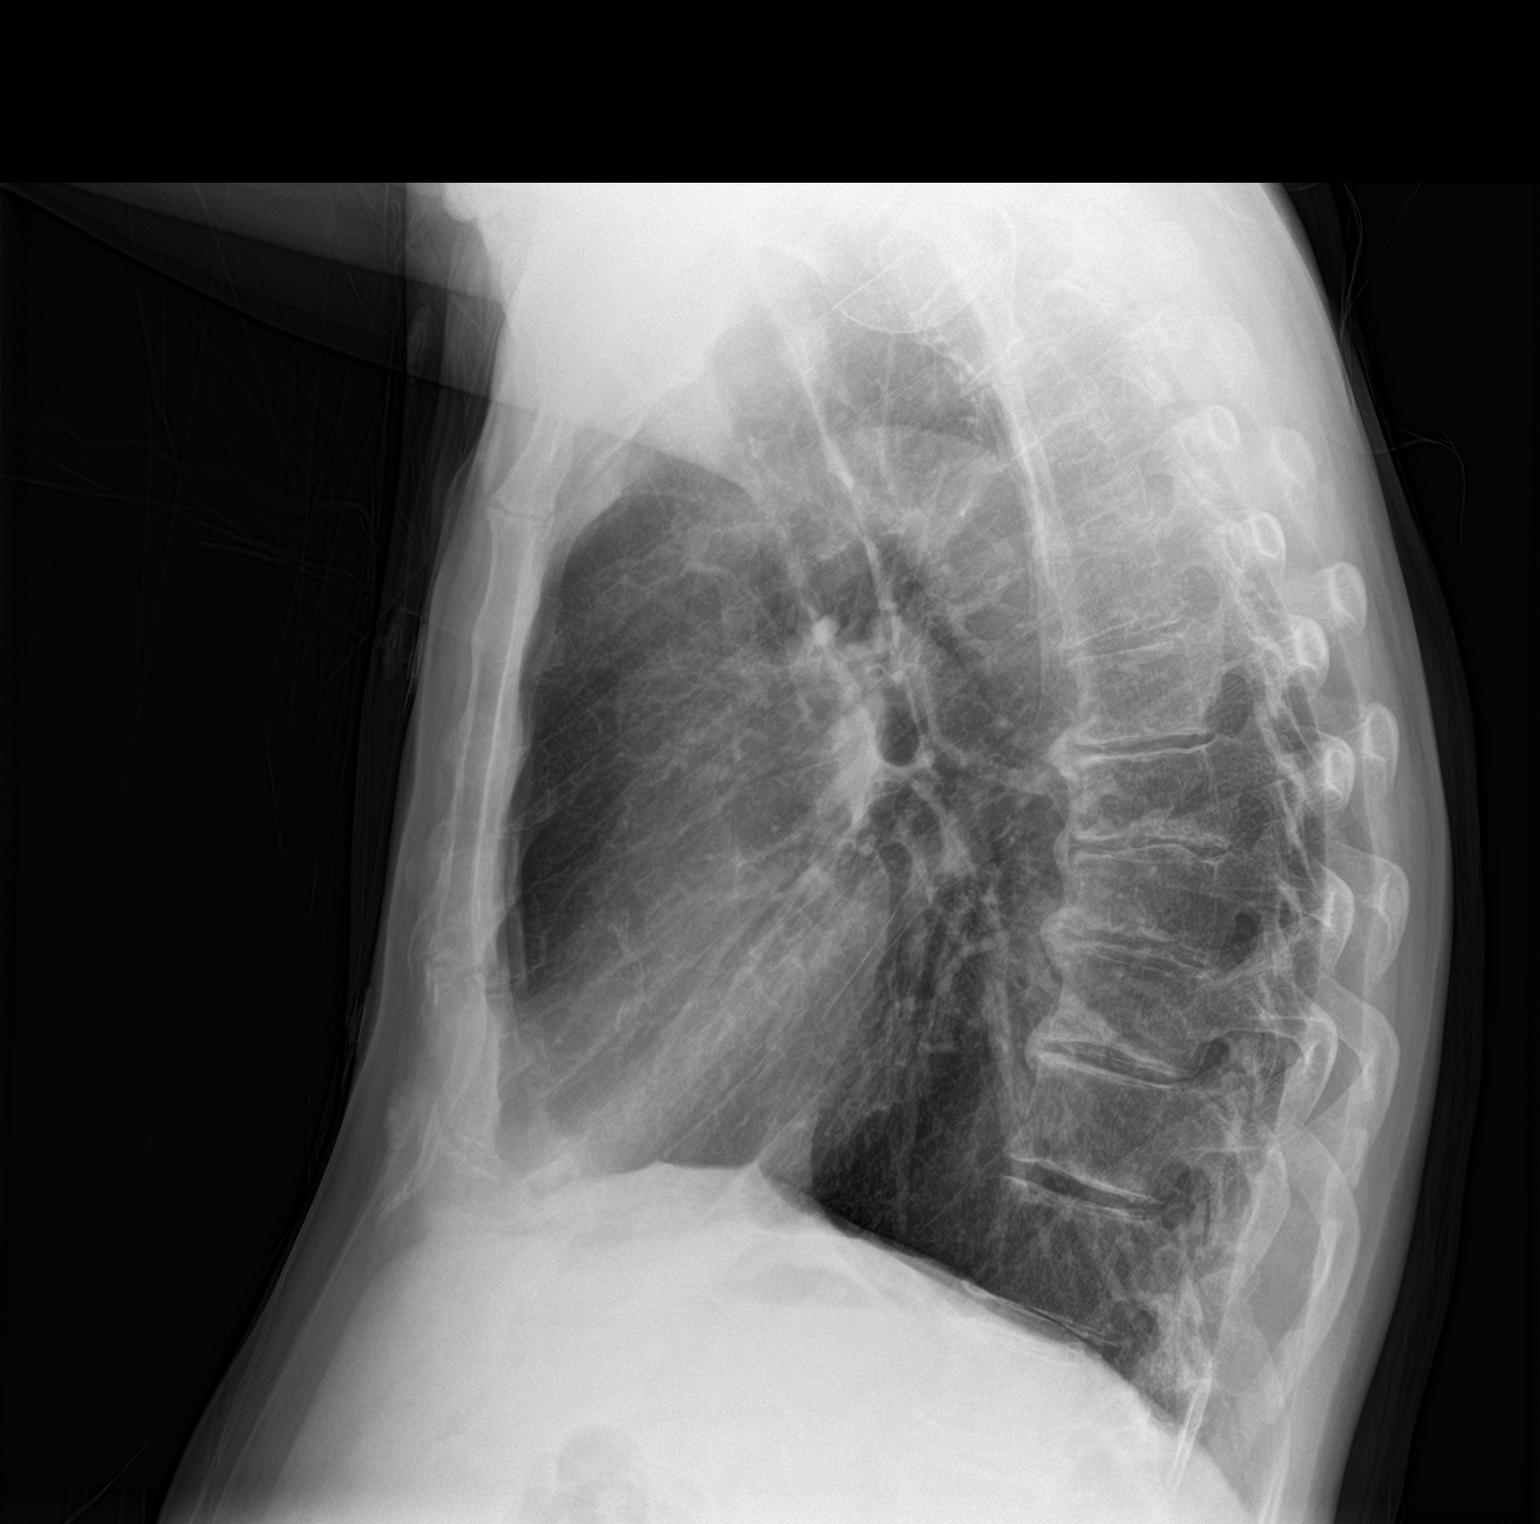

[2 of 2 positions shown; findings below may reference images not displayed]

FINDINGS: Cardiomediastinal silhouette is within normal limits. Lungs are well
inflated. No focal consolidation or mass. No pleural effusion or
pneumothorax. No acute displaced fracture.
IMPRESSION: No active cardiopulmonary disease.

## 2023-03-07 DIAGNOSIS — M542 Cervicalgia: Secondary | ICD-10-CM | POA: Insufficient documentation

## 2023-03-07 DIAGNOSIS — F1010 Alcohol abuse, uncomplicated: Secondary | ICD-10-CM | POA: Insufficient documentation

## 2023-03-07 DIAGNOSIS — G8929 Other chronic pain: Secondary | ICD-10-CM | POA: Insufficient documentation

## 2023-03-07 DIAGNOSIS — R310 Gross hematuria: Secondary | ICD-10-CM | POA: Insufficient documentation

## 2023-03-07 DIAGNOSIS — Y909 Presence of alcohol in blood, level not specified: Secondary | ICD-10-CM | POA: Diagnosis not present

## 2023-03-07 LAB — COMPREHENSIVE METABOLIC PANEL
ALT: 226 U/L — ABNORMAL HIGH (ref 0–44)
AST: 408 U/L — ABNORMAL HIGH (ref 15–41)
Albumin: 4 g/dL (ref 3.5–5.0)
Alkaline Phosphatase: 85 U/L (ref 38–126)
Anion gap: 10 (ref 5–15)
BUN: 10 mg/dL (ref 6–20)
CO2: 23 mmol/L (ref 22–32)
Calcium: 9.4 mg/dL (ref 8.9–10.3)
Chloride: 105 mmol/L (ref 98–111)
Creatinine, Ser: 0.67 mg/dL (ref 0.61–1.24)
GFR, Estimated: >60 mL/min (ref >60)
Glucose, Bld: 101 mg/dL — ABNORMAL HIGH (ref 70–99)
Potassium: 3.6 mmol/L (ref 3.5–5.1)
Sodium: 138 mmol/L (ref 135–145)
Total Bilirubin: 0.8 mg/dL (ref 0.3–1.2)
Total Protein: 8 g/dL (ref 6.5–8.1)

## 2023-03-07 LAB — CBC
HCT: 42.2 % (ref 39.0–52.0)
Hemoglobin: 14.1 g/dL (ref 13.0–17.0)
MCH: 32.5 pg (ref 26.0–34.0)
MCHC: 33.4 g/dL (ref 30.0–36.0)
MCV: 97.2 fL (ref 80.0–100.0)
Platelets: 144 10*3/uL — ABNORMAL LOW (ref 150–400)
RBC: 4.34 MIL/uL (ref 4.22–5.81)
RDW: 12.1 % (ref 11.5–15.5)
WBC: 5.6 10*3/uL (ref 4.0–10.5)
nRBC: 0 % (ref 0.0–0.2)

## 2023-03-07 LAB — URINALYSIS, ROUTINE W REFLEX MICROSCOPIC
Bilirubin Urine: NEGATIVE
Glucose, UA: NEGATIVE mg/dL
Hgb urine dipstick: NEGATIVE
Ketones, ur: NEGATIVE mg/dL
Leukocytes,Ua: NEGATIVE
Nitrite: NEGATIVE
Protein, ur: NEGATIVE mg/dL
Specific Gravity, Urine: 1.002 — ABNORMAL LOW (ref 1.005–1.030)
pH: 5 (ref 5.0–8.0)

## 2023-03-07 LAB — LIPASE, BLOOD: Lipase: 49 U/L (ref 11–51)

## 2023-03-07 NOTE — ED Triage Notes (Signed)
Pt BIB [facility] for hematuria, reports has not taken his Eliquis in 3-4 months. Pt reports chronic liver issues d/t cirrhosis, last alcoholic drink was around 19:30 tonight. Last cocaine use was earlier today as well. A&O x4 at current.

## 2023-03-08 ENCOUNTER — Emergency Department
Admission: EM | Admit: 2023-03-08 | Discharge: 2023-03-08 | Disposition: A | Discharge status: Home / Self Care | Payer: Medicaid Other | Attending: Emergency Medicine | Admitting: Emergency Medicine

## 2023-03-08 DIAGNOSIS — G8929 Other chronic pain: Secondary | ICD-10-CM

## 2023-03-08 DIAGNOSIS — F1010 Alcohol abuse, uncomplicated: Secondary | ICD-10-CM

## 2023-03-08 DIAGNOSIS — R310 Gross hematuria: Secondary | ICD-10-CM

## 2023-03-08 HISTORY — DX: Alcoholic cirrhosis of liver without ascites: K70.30

## 2023-03-08 HISTORY — DX: Alcohol dependence, uncomplicated: F10.20

## 2023-03-08 MED ORDER — KETOROLAC TROMETHAMINE 60 MG/2ML IM SOLN
60.0000 mg | Freq: Once | INTRAMUSCULAR | Status: AC
  Administered 2023-03-08: 60 mg via INTRAMUSCULAR
  Filled 2023-03-08: qty 2

## 2023-03-08 NOTE — ED Provider Notes (Signed)
[facility] Provider Note   Event Date/Time   First MD Initiated Contact with Patient 03/08/23 [phone]     (approximate) History  Hematuria  HPI Jose [name] is a 62 y.o. male with cocaine abuse, chronic neck pain, and cirrhosis.  Patient states that he has also been having intermittent gross hematuria.  Patient denies any hematuria.  Patient states he currently has 10/10 neck pain and inquires about "anything for pain".  Patient also states that he is continuing to abuse alcohol and cocaine with the last use earlier today.  Patient endorses 10/10, aching posterior neck pain that radiates down his back.  Patient states that any movement of the neck worsens the symptoms ROS: Patient currently denies any vision changes, tinnitus, difficulty speaking, facial droop, sore throat, chest pain, shortness of breath, abdominal pain, nausea/vomiting/diarrhea, dysuria   Physical Exam  Triage Vital Signs: ED Triage Vitals  Enc Vitals Group     BP 03/07/23 2219 (!) 134/101     Pulse Rate 03/07/23 2219 89     Resp 03/07/23 2219 16     Temp 03/07/23 2219 97.6 F (36.4 C)     Temp Source 03/07/23 2219 Oral     SpO2 03/07/23 2219 100 %     Weight 03/07/23 2220 160 lb (72.6 kg)     Height 03/07/23 2220 5\' 8"  (1.727 m)     Head Circumference --      Peak Flow --      Pain Score 03/07/23 2220 10     Pain Loc --      Pain Edu? --      Excl. in GC? --    Most recent vital signs: Vitals:   03/07/23 2219  BP: (!) 134/101  Pulse: 89  Resp: 16  Temp: 97.6 F (36.4 C)  SpO2: 100%   General: Awake, oriented x4. CV:  Good peripheral perfusion.  Resp:  Normal effort.  Abd:  No distention.  Other:  Middle-aged well-developed Caucasian male laying in bed in no acute distress.  Mildly slurred speech ED Results / Procedures / Treatments  Labs (all labs ordered are listed, but only abnormal results are displayed) Labs Reviewed  COMPREHENSIVE METABOLIC PANEL - Abnormal;  Notable for the following components:      Result Value   Glucose, Bld 101 (*)    AST 408 (*)    ALT 226 (*)    All other components within normal limits  CBC - Abnormal; Notable for the following components:   Platelets 144 (*)    All other components within normal limits  URINALYSIS, ROUTINE W REFLEX MICROSCOPIC - Abnormal; Notable for the following components:   Color, Urine STRAW (*)    APPearance CLEAR (*)    Specific Gravity, Urine 1.002 (*)    All other components within normal limits  LIPASE, BLOOD  ETHANOL   PROCEDURES: Critical Care performed: No .1-3 Lead EKG Interpretation  Performed by: Merwyn Katos, MD Authorized by: Merwyn Katos, MD     Interpretation: normal     ECG rate:  71   ECG rate assessment: normal     Rhythm: sinus rhythm     Ectopy: none     Conduction: normal    MEDICATIONS ORDERED IN ED: Medications  ketorolac (TORADOL) injection 60 mg (has no administration in time range)   IMPRESSION / MDM / ASSESSMENT AND PLAN / ED COURSE  I reviewed the triage vital signs and the nursing notes.  The patient is on the cardiac monitor to evaluate for evidence of arrhythmia and/or significant heart rate changes. Patient's presentation is most consistent with acute presentation with potential threat to life or bodily function. Presents with altered mental status complaining of intermittent hematuria and chronic back pain +Slurred, sluggish behavior. Stated EtOH intoxication. Airway maintained. Unlikely intracranial bleed, opioid intoxication or coingestion, sepsis, hypothyroidism. Suspect likely transient course of intoxication with expected  improvement of symptoms as patient metabolizes offending agent.  Plan: frequent reassessments, UA UA shows no evidence of hematuria Reassessment Note: Time: 3 hours since initial presentation. Evaluation: Frequent mental status exams showed improving symptoms and evidence that the  patients AMS was secondary to intoxication. Pt able to ambulate without difficulty and PO tolerant. Plan DC home with ride and return precautions. Disposition: Discharge home    FINAL CLINICAL IMPRESSION(S) / ED DIAGNOSES   Final diagnoses:  Chronic neck pain  Intermittent gross hematuria  Alcohol abuse   Rx / DC Orders   ED Discharge Orders          Ordered    Ambulatory referral to Social Work        03/08/23 0058           Note:  This document was prepared using Dragon voice recognition software and may include unintentional dictation errors.   Merwyn Katos, MD 03/08/23 [phone]

## 2023-03-12 NOTE — Congregational Nurse Program (Signed)
  Dept: [phone]   Congregational Nurse Program Note  Date of Encounter: 03/12/2023 Client to [facility] with request for vital sign check and emotional support. BP 138/66 (BP Location: Left Arm, Patient Position: Sitting, Cuff Size: Normal)   Pulse 87   SpO2 99% . Emotional support given. [name] BSN, RN  Past Medical History: Past Medical History:  Diagnosis Date   EtOH dependence (HCC)    Hep C w/o coma, chronic (HCC)    Hypertension    Liver cirrhosis, alcoholic (HCC)    Polysubstance abuse (HCC)    Pulmonary embolism (HCC)     Encounter Details:  CNP Questionnaire - 03/12/23 1000       Questionnaire   Ask client: Do you give verbal consent for me to treat you today? Yes    Student Assistance N/A    Location Patient Served  [facility]    Visit Setting with Client Organization    Patient Status Unhoused    Insurance [facility]    Insurance/Financial Assistance Referral N/A    Medication N/A    Medical Provider Yes   client was seen in July 2023, has not rescheduled follow up that was due in October   Screening Referrals Made N/A    Medical Referrals Made N/A    Medical Appointment Made N/A    Recently w/o PCP, now 1st time PCP visit completed due to CNs referral or appointment made N/A    Food Have Food Insecurities   replacement food stamp card has been received   Transportation N/A   walks or [place] [employer] [place], would need transportaion assistance for Medical appointments   Housing/Utilities No permanent housing   client stays off and on with friends, does at times sleep outdoors on random porches in the neighborhood   Interpersonal Safety N/A    Interventions Advocate/Support    Abnormal to Normal Screening Since Last CN Visit N/A    Screenings CN Performed Blood Pressure;Pulse Ox    Sent Client to Lab for: N/A    Did client attend any of the following based off CNs referral or appointments made? N/A   client has been getting lunch at  a small church on [address] he is aware of the food at the [employer] and [employer] shelter   ED Visit Averted N/A    Life-Saving Intervention Made N/A

## 2023-03-19 NOTE — Congregational Nurse Program (Signed)
  Dept: [phone]   Congregational Nurse Program Note  Date of Encounter: 03/19/2023 Client to [facility] day center for support. No medical needs. RN continues to encourage client to protect himself from the sun and use his sunscreen. Water given. [name] BSN, RN Past Medical History: Past Medical History:  Diagnosis Date   EtOH dependence (HCC)    Hep C w/o coma, chronic (HCC)    Hypertension    Liver cirrhosis, alcoholic (HCC)    Polysubstance abuse (HCC)    Pulmonary embolism (HCC)     Encounter Details:  CNP Questionnaire - 03/19/23 1223       Questionnaire   Ask client: Do you give verbal consent for me to treat you today? Yes    Student Assistance N/A    Location Patient Served  [facility]    Visit Setting with Client Organization    Patient Status Unhoused    Insurance [facility]    Insurance/Financial Assistance Referral N/A    Medication N/A    Medical Provider Yes   client was seen in July 2023, has not rescheduled follow up that was due in October   Screening Referrals Made N/A    Medical Referrals Made N/A    Medical Appointment Made N/A    Recently w/o PCP, now 1st time PCP visit completed due to CNs referral or appointment made N/A    Food Have Food Insecurities   replacement food stamp card has been received   Transportation N/A   walks or [place] [employer] [place], would need transportaion assistance for Medical appointments   Housing/Utilities No permanent housing   client stays off and on with friends, does at times sleep outdoors on random porches in the neighborhood   Interpersonal Safety N/A    Interventions Advocate/Support    Abnormal to Normal Screening Since Last CN Visit N/A    Screenings CN Performed N/A    Sent Client to Lab for: N/A    Did client attend any of the following based off CNs referral or appointments made? N/A   client has been getting lunch at a small church on [address] he is aware of the food at the [employer]  and [employer] shelter   ED Visit Averted N/A    Life-Saving Intervention Made N/A

## 2023-03-20 NOTE — Congregational Nurse Program (Signed)
  Dept: [phone]   Congregational Nurse Program Note  Date of Encounter: 03/20/2023 Client to [facility] day center for his daily support and today requested a blood pressure check. He reports he did drink beer this morning, "but nothing else, no crack". He has sunburn to the top of his head and nose. He has had multiple hats, but loses them easily. Sunscreen given and client does use, but does not reapply during the day. 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 He stated that he was going to "stay in the shade today". He is able to go to a local store for short periods to cool off and voiced understanding regarding the importance of hydration, other than alcohol in the extreme heat. Emotional support given. [name] BSN, RN Past Medical History: Past Medical History:  Diagnosis Date   EtOH dependence (HCC)    Hep C w/o coma, chronic (HCC)    Hypertension    Liver cirrhosis, alcoholic (HCC)    Polysubstance abuse (HCC)    Pulmonary embolism (HCC)     Encounter Details:  CNP Questionnaire - 03/20/23 1045       Questionnaire   Ask client: Do you give verbal consent for me to treat you today? Yes    Student Assistance N/A    Location Patient Served  [facility]    Visit Setting with Client Organization    Patient Status Unhoused    Insurance [facility]    Insurance/Financial Assistance Referral N/A    Medication N/A    Medical Provider Yes   client was seen in July 2023, has not rescheduled follow up that was due in October   Screening Referrals Made N/A    Medical Referrals Made N/A    Medical Appointment Made N/A    Recently w/o PCP, now 1st time PCP visit completed due to CNs referral or appointment made N/A    Food Have Food Insecurities   replacement food stamp card has been received   Transportation N/A   walks or [place] [employer] [place], would need transportaion assistance for Medical  appointments   Housing/Utilities No permanent housing   client stays off and on with friends, does at times sleep outdoors on random porches in the neighborhood   Interpersonal Safety N/A    Interventions Advocate/Support    Abnormal to Normal Screening Since Last CN Visit N/A    Screenings CN Performed Blood Pressure;Pulse Ox    Sent Client to Lab for: N/A    Did client attend any of the following based off CNs referral or appointments made? N/A   client has been getting lunch at a small church on [address] he is aware of the food at the [employer] and [employer] shelter   ED Visit Averted N/A    Life-Saving Intervention Made N/A

## 2023-04-03 NOTE — Congregational Nurse Program (Signed)
  Dept: [phone]   Congregational Nurse Program Note  Date of Encounter: 04/03/2023 Client to [facility] day center for vital sign check and emotional support.  BP 138/78 (BP Location: Left Arm, Patient Position: Sitting, Cuff Size: Normal)   Pulse 80   SpO2 95% .  RN continues to provide education regarding sun and heat protection. Client does have and is using sunscreen. Reiterated the importance of hydration and staying out of the sun. Client voiced understanding. [name] BSN, RN  Past Medical History: Past Medical History:  Diagnosis Date   EtOH dependence (HCC)    Hep C w/o coma, chronic (HCC)    Hypertension    Liver cirrhosis, alcoholic (HCC)    Polysubstance abuse (HCC)    Pulmonary embolism (HCC)     Encounter Details:  CNP Questionnaire - 04/03/23 0955       Questionnaire   Ask client: Do you give verbal consent for me to treat you today? Yes    Student Assistance N/A    Location Patient Served  [facility]    Visit Setting with Client Organization    Patient Status Unhoused    Insurance Medicaid   [place] [facility]   Insurance/Financial Assistance Referral N/A    Medication N/A    Medical Provider Yes   client was seen in July 2023, has not rescheduled follow up that was due in October   Screening Referrals Made N/A    Medical Referrals Made N/A    Medical Appointment Made N/A    Recently w/o PCP, now 1st time PCP visit completed due to CNs referral or appointment made N/A    Food Have Food Insecurities   replacement food stamp card has been received   Transportation N/A   walks or [place] [employer] [place], would need transportaion assistance for Medical appointments   Housing/Utilities No permanent housing   client stays off and on with friends, does at times sleep outdoors on random porches in the neighborhood   Interpersonal Safety N/A    Interventions Advocate/Support    Abnormal to Normal Screening Since Last CN Visit N/A    Screenings CN  Performed Blood Pressure;Pulse Ox    Sent Client to Lab for: N/A    Did client attend any of the following based off CNs referral or appointments made? N/A   client has been getting lunch at a small church on [address] he is aware of the food at the [employer] and [employer] shelter   ED Visit Averted N/A    Life-Saving Intervention Made N/A

## 2023-04-09 NOTE — Congregational Nurse Program (Signed)
  Dept: [phone]   Congregational Nurse Program Note  Date of Encounter: 04/09/2023 Client to [facility] day center with request for foot care. Toe nails trimmed. No open areas or blisters noted. Client does have clean socks and shoes that fit. Support given, no other needs at this time. [name] BSN, RN Past Medical History: Past Medical History:  Diagnosis Date   EtOH dependence (HCC)    Hep C w/o coma, chronic (HCC)    Hypertension    Liver cirrhosis, alcoholic (HCC)    Polysubstance abuse (HCC)    Pulmonary embolism (HCC)     Encounter Details:  CNP Questionnaire - 04/09/23 1312       Questionnaire   Ask client: Do you give verbal consent for me to treat you today? Yes    Student Assistance N/A    Location Patient Served  [facility]    Visit Setting with Client Organization    Patient Status Unhoused    Insurance Medicaid   [place] Access   Insurance/Financial Assistance Referral N/A    Medication N/A    Medical Provider Yes   client was seen in July 2023, has not rescheduled follow up that was due in October   Screening Referrals Made N/A    Medical Referrals Made N/A    Medical Appointment Made N/A    Recently w/o PCP, now 1st time PCP visit completed due to CNs referral or appointment made N/A    Food Have Food Insecurities   replacement food stamp card has been received   Transportation N/A   walks or [place] [employer] bus [place], would need transportaion assistance for Medical appointments   Housing/Utilities No permanent housing   client stays off and on with friends, does at times sleep outdoors on random porches in the neighborhood   Interpersonal Safety N/A    Interventions Advocate/Support    Abnormal to Normal Screening Since Last CN Visit N/A    Screenings CN Performed N/A    Sent Client to Lab for: N/A    Did client attend any of the following based off CNs referral or appointments made? N/A   client has been getting lunch at a small church on  [address] he is aware of the food at the [employer] and [employer] shelter   ED Visit Averted N/A    Life-Saving Intervention Made N/A

## 2023-04-10 NOTE — Congregational Nurse Program (Signed)
  Dept: [phone]   Congregational Nurse Program Note  Date of Encounter: 04/10/2023 Client to [facility] day center with need of first aid to 2 small skin tears, one on each forearm. Areas cleansed with wound wash and sterile gauze, antibiotic ointment applied and covered with nonadherent adhesive dressings. [name] BSN, RN Past Medical History: Past Medical History:  Diagnosis Date   EtOH dependence (HCC)    Hep C w/o coma, chronic (HCC)    Hypertension    Liver cirrhosis, alcoholic (HCC)    Polysubstance abuse (HCC)    Pulmonary embolism (HCC)     Encounter Details:  CNP Questionnaire - 04/10/23 1136       Questionnaire   Ask client: Do you give verbal consent for me to treat you today? Yes    Student Assistance N/A    Location Patient Served  [facility]    Visit Setting with Client Organization    Patient Status Unhoused    Insurance Medicaid   [place] Access   Insurance/Financial Assistance Referral N/A    Medication N/A    Medical Provider Yes   client was seen in July 2023, has not rescheduled follow up that was due in October   Screening Referrals Made N/A    Medical Referrals Made N/A    Medical Appointment Made N/A    Recently w/o PCP, now 1st time PCP visit completed due to CNs referral or appointment made N/A    Food Have Food Insecurities   replacement food stamp card has been received   Transportation N/A   walks or [place] [employer] [place], would need transportaion assistance for Medical appointments   Housing/Utilities No permanent housing   client stays off and on with friends, does at times sleep outdoors on random porches in the neighborhood   Interpersonal Safety N/A    Interventions Advocate/Support    Abnormal to Normal Screening Since Last CN Visit N/A    Screenings CN Performed N/A    Sent Client to Lab for: N/A    Did client attend any of the following based off CNs referral or appointments made? N/A   client has been getting lunch at  a small church on [address] he is aware of the food at the [employer] and [employer] shelter   ED Visit Averted N/A    Life-Saving Intervention Made N/A

## 2023-04-17 NOTE — Congregational Nurse Program (Signed)
  Dept: [phone]   Congregational Nurse Program Note  Date of Encounter: 04/17/2023 Client to [facility] day center with request for assistance in replacing his food stamp card. Assistance given. New card to be sent to the center. Client does have a [employer] at the center. [name], BSN, RN Past Medical History: Past Medical History:  Diagnosis Date   EtOH dependence (HCC)    Hep C w/o coma, chronic (HCC)    Hypertension    Liver cirrhosis, alcoholic (HCC)    Polysubstance abuse (HCC)    Pulmonary embolism (HCC)     Encounter Details:  CNP Questionnaire - 04/17/23 1249       Questionnaire   Ask client: Do you give verbal consent for me to treat you today? Yes    Student Assistance N/A    Location Patient Served  [facility]    Visit Setting with Client Organization    Patient Status Unhoused    Insurance Medicaid   [place] Access   Insurance/Financial Assistance Referral N/A    Medication N/A    Medical Provider Yes   client was seen in July 2023, has not rescheduled follow up that was due in October   Screening Referrals Made N/A    Medical Referrals Made N/A    Medical Appointment Made N/A    Recently w/o PCP, now 1st time PCP visit completed due to CNs referral or appointment made N/A    Food Have Food Insecurities   Food stamp card lost, replacement ordered 7/24   Transportation N/A   walks or [place] [employer] [place], would need transportaion assistance for Medical appointments   Housing/Utilities No permanent housing   client stays off and on with friends, does at times sleep outdoors on random porches in the neighborhood   Interpersonal Safety N/A    Interventions Advocate/Support    Abnormal to Normal Screening Since Last CN Visit N/A    Screenings CN Performed N/A    Sent Client to Lab for: N/A    Did client attend any of the following based off CNs referral or appointments made? N/A   client has been getting lunch at a small church on  [address] he is aware of the food at the [employer] and [employer] shelter   ED Visit Averted N/A    Life-Saving Intervention Made N/A

## 2023-04-23 NOTE — Congregational Nurse Program (Signed)
  Dept: [phone]   Congregational Nurse Program Note  Date of Encounter: 04/23/2023 Client to [facility] day center with report a "sore" at the bottom of his left foot. No blister, or open area. Small round red/blanched spot, the size of a pencil eraser noted to the base of his left great toe. Callus pad placed. Client to return tomorrow, for continues observation. [name] BSN, RN Past Medical History: Past Medical History:  Diagnosis Date   EtOH dependence (HCC)    Hep C w/o coma, chronic (HCC)    Hypertension    Liver cirrhosis, alcoholic (HCC)    Polysubstance abuse (HCC)    Pulmonary embolism (HCC)     Encounter Details:  CNP Questionnaire - 04/23/23 0955       Questionnaire   Ask client: Do you give verbal consent for me to treat you today? Yes    Student Assistance N/A    Location Patient Served  [facility]    Visit Setting with Client Organization    Patient Status Unhoused    Insurance Medicaid   [place] Access   Insurance/Financial Assistance Referral N/A    Medication N/A    Medical Provider Yes   client was seen in July 2023, has not rescheduled follow up that was due in October   Screening Referrals Made N/A    Medical Referrals Made N/A    Medical Appointment Made N/A    Recently w/o PCP, now 1st time PCP visit completed due to CNs referral or appointment made N/A    Food Have Food Insecurities   Food stamp card lost, replacement ordered 7/24   Transportation N/A   walks or [place], would need transportaion assistance for Medical appointments   Housing/Utilities No permanent housing   client stays off and on with friends, does at times sleep outdoors on random porches in the neighborhood   Interpersonal Safety N/A    Interventions Advocate/Support    Abnormal to Normal Screening Since Last CN Visit N/A    Screenings CN Performed Blood Pressure;Pulse Ox    Sent Client to Lab for: N/A    Did client attend any of the following  based off CNs referral or appointments made? N/A   client has been getting lunch at a small church on [address] he is aware of the food at the [employer] and [employer] shelter   ED Visit Averted N/A    Life-Saving Intervention Made N/A

## 2023-04-24 NOTE — Congregational Nurse Program (Signed)
  Dept: [phone]   Congregational Nurse Program Note  Date of Encounter: 04/24/2023 Client to [facility] day center with request for follow up foot care for sore callused areas to the ball of his left foot. Foot care provided. Client has clean socks and proper fitting shoes at this time. He reports he is using sunscreen regularly, at least on his face. Continued education regarding hydration and sun protection provided. [name] BSN, RN  Past Medical History: Past Medical History:  Diagnosis Date   EtOH dependence (HCC)    Hep C w/o coma, chronic (HCC)    Hypertension    Liver cirrhosis, alcoholic (HCC)    Polysubstance abuse (HCC)    Pulmonary embolism (HCC)     Encounter Details:  CNP Questionnaire - 04/24/23 1145       Questionnaire   Ask client: Do you give verbal consent for me to treat you today? Yes    Student Assistance N/A    Location Patient Served  [facility]    Visit Setting with Client Organization    Patient Status Unhoused    Insurance Medicaid   [place] Access   Insurance/Financial Assistance Referral N/A    Medication N/A    Medical Provider Yes   client was seen in July 2023, has not rescheduled follow up that was due in October   Screening Referrals Made N/A    Medical Referrals Made N/A    Medical Appointment Made N/A    Recently w/o PCP, now 1st time PCP visit completed due to CNs referral or appointment made N/A    Food Have Food Insecurities   Food stamp card lost, replacement ordered 7/24   Transportation N/A   walks or [place] [employer] [place], would need transportaion assistance for Medical appointments   Housing/Utilities No permanent housing   client stays off and on with friends, does at times sleep outdoors on random porches in the neighborhood   Interpersonal Safety N/A    Interventions Advocate/Support    Abnormal to Normal Screening Since Last CN Visit N/A    Screenings CN Performed N/A    Sent Client to Lab for: N/A    Did  client attend any of the following based off CNs referral or appointments made? N/A   client has been getting lunch at a small church on [address] he is aware of the food at the [employer] and [employer] shelter   ED Visit Averted N/A    Life-Saving Intervention Made N/A

## 2023-04-25 NOTE — Congregational Nurse Program (Signed)
  Dept: [phone]   Congregational Nurse Program Note  Date of Encounter: 04/25/2023 Client to [facility] day center for support and skin care. 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 RN again encouraged client to protect himself from the sun and heat. Education and support provided regarding need for extra hydration with water. [name] BSN, RN Past Medical History: Past Medical History:  Diagnosis Date   EtOH dependence (HCC)    Hep C w/o coma, chronic (HCC)    Hypertension    Liver cirrhosis, alcoholic (HCC)    Polysubstance abuse (HCC)    Pulmonary embolism (HCC)     Encounter Details:  CNP Questionnaire - 04/25/23 1200       Questionnaire   Ask client: Do you give verbal consent for me to treat you today? Yes    Student Assistance N/A    Location Patient Served  [facility]    Visit Setting with Client Organization    Patient Status Unhoused    Insurance Medicaid   [place] Access   Insurance/Financial Assistance Referral N/A    Medication N/A    Medical Provider Yes   client was seen in July 2023, has not rescheduled follow up that was due in October   Screening Referrals Made N/A    Medical Referrals Made N/A    Medical Appointment Made N/A    Recently w/o PCP, now 1st time PCP visit completed due to CNs referral or appointment made N/A    Food Have Food Insecurities   Food stamp card lost, replacement ordered 7/24   Transportation N/A   walks or [place], would need transportaion assistance for Medical appointments   Housing/Utilities No permanent housing   client stays off and on with friends, does at times sleep outdoors on random porches in the neighborhood   Interpersonal Safety N/A    Interventions Advocate/Support    Abnormal to Normal Screening Since Last CN Visit N/A    Screenings CN Performed N/A    Sent Client to Lab for: N/A    Did client attend any of the  following based off CNs referral or appointments made? N/A   client has been getting lunch at a small church on [address] he is aware of the food at the [employer] and [employer] shelter   ED Visit Averted N/A    Life-Saving Intervention Made N/A

## 2023-04-30 NOTE — Congregational Nurse Program (Signed)
  Dept: [phone]   Congregational Nurse Program Note  Date of Encounter: 04/30/2023 Client to [facility] day center for foot and skin care, both provided. No other needs at this time. Client has clean dry socks and shoes. [name] BSN, RN Past Medical History: Past Medical History:  Diagnosis Date   EtOH dependence (HCC)    Hep C w/o coma, chronic (HCC)    Hypertension    Liver cirrhosis, alcoholic (HCC)    Polysubstance abuse (HCC)    Pulmonary embolism (HCC)     Encounter Details:  CNP Questionnaire - 04/30/23 1247       Questionnaire   Ask client: Do you give verbal consent for me to treat you today? Yes    Student Assistance N/A    Location Patient Served  [facility]    Visit Setting with Client Organization    Patient Status Unhoused    Insurance Medicaid   [place] Access   Insurance/Financial Assistance Referral N/A    Medication N/A    Medical Provider Yes   client was seen in July 2023, has not rescheduled follow up that was due in October   Screening Referrals Made N/A    Medical Referrals Made N/A    Medical Appointment Made N/A    Recently w/o PCP, now 1st time PCP visit completed due to CNs referral or appointment made N/A    Food Have Food Insecurities   Food stamp card lost, replacement ordered 7/24   Transportation N/A   walks or [place], would need transportaion assistance for Medical appointments   Housing/Utilities No permanent housing   client stays off and on with friends, does at times sleep outdoors on random porches in the neighborhood   Interpersonal Safety N/A    Interventions Advocate/Support    Abnormal to Normal Screening Since Last CN Visit N/A    Screenings CN Performed N/A    Sent Client to Lab for: N/A    Did client attend any of the following based off CNs referral or appointments made? N/A   client has been getting lunch at a small church on [address] he is aware of the food at the [employer] and  [employer] shelter   ED Visit Averted N/A    Life-Saving Intervention Made N/A

## 2023-05-09 NOTE — Congregational Nurse Program (Signed)
  Dept: [phone]   Congregational Nurse Program Note  Date of Encounter: 05/09/2023 Client to [facility] day center with need for skin care. He spends most of the day in the sun, does have sunscreen, but picks the flakes from his forehead and nose. These areas then become raw and and bleed. Skin lotion applied. RN provides daily education regarding better skin protection, client declines to wear a hat. Educated to put sunscreen on more frequently than once a day. Client voiced understanding.  [name] BSN, RN. Past Medical History: Past Medical History:  Diagnosis Date   EtOH dependence (HCC)    Hep C w/o coma, chronic (HCC)    Hypertension    Liver cirrhosis, alcoholic (HCC)    Polysubstance abuse (HCC)    Pulmonary embolism (HCC)     Encounter Details:  CNP Questionnaire - 05/09/23 1137       Questionnaire   Ask client: Do you give verbal consent for me to treat you today? Yes    Student Assistance N/A    Location Patient Served  [facility]    Visit Setting with Client Organization    Patient Status Unhoused    Insurance Medicaid   [place] Access   Insurance/Financial Assistance Referral N/A    Medication N/A    Medical Provider Yes   client was seen in July 2023, has not rescheduled follow up that was due in October   Screening Referrals Made N/A    Medical Referrals Made N/A    Medical Appointment Made N/A    Recently w/o PCP, now 1st time PCP visit completed due to CNs referral or appointment made N/A    Food Have Food Insecurities   Food stamp card lost, replacement ordered 7/24   Transportation N/A   client walks to nearby places. Does have [employer] transportation   Housing/Utilities No permanent housing   Client is currenlty sleeping in a car in an impound lot   Interpersonal Safety N/A    Interventions Advocate/Support    Abnormal to Normal Screening Since Last CN Visit N/A    Screenings CN Performed N/A    Sent Client to Lab for: N/A    Did client  attend any of the following based off CNs referral or appointments made? N/A   client has been getting lunch at a small church on [address] he is aware of the food at the [employer] and [employer] shelter   ED Visit Averted N/A    Life-Saving Intervention Made N/A

## 2023-05-12 ENCOUNTER — Emergency Department
Admission: EM | Admit: 2023-05-12 | Discharge: 2023-05-12 | Disposition: A | Discharge status: Home Health | Payer: MEDICAID | Attending: Emergency Medicine | Admitting: Emergency Medicine

## 2023-05-12 ENCOUNTER — Other Ambulatory Visit: Payer: Self-pay

## 2023-05-12 DIAGNOSIS — R109 Unspecified abdominal pain: Secondary | ICD-10-CM | POA: Diagnosis present

## 2023-05-12 DIAGNOSIS — M7989 Other specified soft tissue disorders: Secondary | ICD-10-CM | POA: Insufficient documentation

## 2023-05-12 DIAGNOSIS — Z5321 Procedure and treatment not carried out due to patient leaving prior to being seen by health care provider: Secondary | ICD-10-CM | POA: Diagnosis not present

## 2023-05-12 DIAGNOSIS — R112 Nausea with vomiting, unspecified: Secondary | ICD-10-CM | POA: Diagnosis not present

## 2023-05-12 LAB — COMPREHENSIVE METABOLIC PANEL
ALT: 148 U/L — ABNORMAL HIGH (ref 0–44)
AST: 325 U/L — ABNORMAL HIGH (ref 15–41)
Albumin: 3.7 g/dL (ref 3.5–5.0)
Alkaline Phosphatase: 95 U/L (ref 38–126)
Anion gap: 11 (ref 5–15)
BUN: 6 mg/dL (ref 6–20)
CO2: 25 mmol/L (ref 22–32)
Calcium: 9.5 mg/dL (ref 8.9–10.3)
Chloride: 100 mmol/L (ref 98–111)
Creatinine, Ser: 0.64 mg/dL (ref 0.61–1.24)
GFR, Estimated: >60 mL/min (ref >60)
Glucose, Bld: 105 mg/dL — ABNORMAL HIGH (ref 70–99)
Potassium: 3.3 mmol/L — ABNORMAL LOW (ref 3.5–5.1)
Sodium: 136 mmol/L (ref 135–145)
Total Bilirubin: 1.8 mg/dL — ABNORMAL HIGH (ref 0.3–1.2)
Total Protein: 8.2 g/dL — ABNORMAL HIGH (ref 6.5–8.1)

## 2023-05-12 LAB — CBC
HCT: 40.7 % (ref 39.0–52.0)
Hemoglobin: 14.1 g/dL (ref 13.0–17.0)
MCH: 33.7 pg (ref 26.0–34.0)
MCHC: 34.6 g/dL (ref 30.0–36.0)
MCV: 97.1 fL (ref 80.0–100.0)
Platelets: 112 10*3/uL — ABNORMAL LOW (ref 150–400)
RBC: 4.19 MIL/uL — ABNORMAL LOW (ref 4.22–5.81)
RDW: 12.8 % (ref 11.5–15.5)
WBC: 7.8 10*3/uL (ref 4.0–10.5)
nRBC: 0 % (ref 0.0–0.2)

## 2023-05-12 LAB — LIPASE, BLOOD: Lipase: 49 U/L (ref 11–51)

## 2023-05-12 NOTE — ED Triage Notes (Signed)
C/O abd pain/ swelling/ Nausea, vomiting and leower leg swelling for a while.  Patient is concerned he has cirrhosis.  AAOx3.  Skin warm and dry. NAD

## 2023-05-12 NOTE — ED Notes (Signed)
Called name for repeat vitals. No answer in lobby.

## 2023-05-14 NOTE — Congregational Nurse Program (Signed)
  Dept: [phone]   Congregational Nurse Program Note  Date of Encounter: 05/14/2023 Client to [facility] for blood pressure check and support. He reports he went to the [facility] ED over the weekend, due to abdominal pain and "his belly being bigger". He left before being seen. He does drink daily. Abdomen appeared more distended, no shortness of breath or pain with gentle palpation. RN instructed client to seek medical treatment if he had any further distention or breathing difficulties. RN to attempt to keep abdominal measurements over the next few days. Client in agreement. Emotional support given. [name] BSN, RN Past Medical History: Past Medical History:  Diagnosis Date   EtOH dependence (HCC)    Hep C w/o coma, chronic (HCC)    Hypertension    Liver cirrhosis, alcoholic (HCC)    Polysubstance abuse (HCC)    Pulmonary embolism (HCC)     Encounter Details:  CNP Questionnaire - 05/14/23 1100       Questionnaire   Ask client: Do you give verbal consent for me to treat you today? Yes    Student Assistance N/A    Location Patient Served  [facility]    Visit Setting with Client Organization    Patient Status Unhoused    Insurance Medicaid   [place] Access   Insurance/Financial Assistance Referral N/A    Medication N/A    Medical Provider Yes   client was seen in July 2023, has not rescheduled follow up that was due in October   Screening Referrals Made N/A    Medical Referrals Made N/A    Medical Appointment Made N/A    Recently w/o PCP, now 1st time PCP visit completed due to CNs referral or appointment made N/A    Food Have Food Insecurities   69.00 in [employer] benefit/month   Transportation N/A   client walks to nearby places. Does have [employer] transportation   Housing/Utilities No permanent housing   Client is currenlty sleeping in a car in an impound lot   Interpersonal Safety N/A    Interventions Advocate/Support    Abnormal to Normal Screening Since Last CN  Visit N/A    Screenings CN Performed Blood Pressure;Pulse Ox    Sent Client to Lab for: N/A    Did client attend any of the following based off CNs referral or appointments made? N/A   client has been getting lunch at a small church on [address] he is aware of the food at the [employer] and [employer] shelter   ED Visit Averted N/A    Life-Saving Intervention Made N/A

## 2023-05-15 NOTE — Congregational Nurse Program (Signed)
  Dept: [phone]   Congregational Nurse Program Note  Date of Encounter: 05/15/2023 Client to [facility] day center with complaints of insect bites vs poison ivy. On his hands and left side.  Hydrocortisone cream applied after his shower. Jose [name] BSN, RN Past Medical History: Past Medical History:  Diagnosis Date   EtOH dependence (HCC)    Hep C w/o coma, chronic (HCC)    Hypertension    Liver cirrhosis, alcoholic (HCC)    Polysubstance abuse (HCC)    Pulmonary embolism (HCC)     Encounter Details:  CNP Questionnaire - 05/15/23 1042       Questionnaire   Ask client: Do you give verbal consent for me to treat you today? Yes    Student Assistance N/A    Location Patient Served  [facility]    Visit Setting with Client Organization    Patient Status Unhoused    Insurance Medicaid   [place] Access   Insurance/Financial Assistance Referral N/A    Medication N/A    Medical Provider Yes   client was seen in July 2023, has not rescheduled follow up that was due in October   Screening Referrals Made N/A    Medical Referrals Made N/A    Medical Appointment Made N/A    Recently w/o PCP, now 1st time PCP visit completed due to CNs referral or appointment made N/A    Food Have Food Insecurities   69.00 in [employer] benefit/month   Transportation N/A   client walks to nearby places. Does have [employer] transportation   Housing/Utilities No permanent housing   Client is currenlty sleeping in a car in an impound lot   Interpersonal Safety N/A    Interventions Advocate/Support    Abnormal to Normal Screening Since Last CN Visit N/A    Screenings CN Performed N/A    Sent Client to Lab for: N/A    Did client attend any of the following based off CNs referral or appointments made? N/A   client has been getting lunch at a small church on [address] he is aware of the food at the [employer] and [employer] shelter   ED Visit Averted N/A    Life-Saving Intervention Made N/A

## 2023-05-18 ENCOUNTER — Encounter: Payer: Self-pay | Admitting: *Deleted

## 2023-05-18 ENCOUNTER — Emergency Department: Payer: MEDICAID

## 2023-05-18 ENCOUNTER — Emergency Department
Admission: EM | Admit: 2023-05-18 | Discharge: 2023-05-19 | Disposition: A | Discharge status: Home / Self Care | Payer: MEDICAID | Attending: Emergency Medicine | Admitting: Emergency Medicine

## 2023-05-18 ENCOUNTER — Other Ambulatory Visit: Payer: Self-pay

## 2023-05-18 DIAGNOSIS — W19XXXA Unspecified fall, initial encounter: Secondary | ICD-10-CM | POA: Insufficient documentation

## 2023-05-18 DIAGNOSIS — F10920 Alcohol use, unspecified with intoxication, uncomplicated: Secondary | ICD-10-CM

## 2023-05-18 DIAGNOSIS — M25561 Pain in right knee: Secondary | ICD-10-CM | POA: Diagnosis not present

## 2023-05-18 DIAGNOSIS — S40812A Abrasion of left upper arm, initial encounter: Secondary | ICD-10-CM | POA: Insufficient documentation

## 2023-05-18 DIAGNOSIS — F10229 Alcohol dependence with intoxication, unspecified: Secondary | ICD-10-CM | POA: Diagnosis not present

## 2023-05-18 DIAGNOSIS — M545 Low back pain, unspecified: Secondary | ICD-10-CM | POA: Insufficient documentation

## 2023-05-18 DIAGNOSIS — R140 Abdominal distension (gaseous): Secondary | ICD-10-CM | POA: Insufficient documentation

## 2023-05-18 DIAGNOSIS — M25552 Pain in left hip: Secondary | ICD-10-CM | POA: Insufficient documentation

## 2023-05-18 DIAGNOSIS — S40922A Unspecified superficial injury of left upper arm, initial encounter: Secondary | ICD-10-CM | POA: Diagnosis present

## 2023-05-18 DIAGNOSIS — T07XXXA Unspecified multiple injuries, initial encounter: Secondary | ICD-10-CM

## 2023-05-18 DIAGNOSIS — R519 Headache, unspecified: Secondary | ICD-10-CM | POA: Diagnosis not present

## 2023-05-18 DIAGNOSIS — M542 Cervicalgia: Secondary | ICD-10-CM | POA: Insufficient documentation

## 2023-05-18 DIAGNOSIS — M25562 Pain in left knee: Secondary | ICD-10-CM | POA: Diagnosis not present

## 2023-05-18 DIAGNOSIS — M25551 Pain in right hip: Secondary | ICD-10-CM | POA: Insufficient documentation

## 2023-05-18 LAB — CBC WITH DIFFERENTIAL/PLATELET
Abs Immature Granulocytes: 0.04 10*3/uL (ref 0.00–0.07)
Basophils Absolute: 0 10*3/uL (ref 0.0–0.1)
Basophils Relative: 0 %
Eosinophils Absolute: 0 10*3/uL (ref 0.0–0.5)
Eosinophils Relative: 0 %
HCT: 39.2 % (ref 39.0–52.0)
Hemoglobin: 13.4 g/dL (ref 13.0–17.0)
Immature Granulocytes: 1 %
Lymphocytes Relative: 22 %
Lymphs Abs: 1.5 10*3/uL (ref 0.7–4.0)
MCH: 33.7 pg (ref 26.0–34.0)
MCHC: 34.2 g/dL (ref 30.0–36.0)
MCV: 98.5 fL (ref 80.0–100.0)
Monocytes Absolute: 0.8 10*3/uL (ref 0.1–1.0)
Monocytes Relative: 12 %
Neutro Abs: 4.3 10*3/uL (ref 1.7–7.7)
Neutrophils Relative %: 65 %
Platelets: 112 10*3/uL — ABNORMAL LOW (ref 150–400)
RBC: 3.98 MIL/uL — ABNORMAL LOW (ref 4.22–5.81)
RDW: 12.9 % (ref 11.5–15.5)
WBC: 6.7 10*3/uL (ref 4.0–10.5)
nRBC: 0 % (ref 0.0–0.2)

## 2023-05-18 LAB — COMPREHENSIVE METABOLIC PANEL
ALT: 144 U/L — ABNORMAL HIGH (ref 0–44)
AST: 328 U/L — ABNORMAL HIGH (ref 15–41)
Albumin: 3.3 g/dL — ABNORMAL LOW (ref 3.5–5.0)
Alkaline Phosphatase: 87 U/L (ref 38–126)
Anion gap: 10 (ref 5–15)
BUN: 5 mg/dL — ABNORMAL LOW (ref 6–20)
CO2: 26 mmol/L (ref 22–32)
Calcium: 9.1 mg/dL (ref 8.9–10.3)
Chloride: 99 mmol/L (ref 98–111)
Creatinine, Ser: 0.58 mg/dL — ABNORMAL LOW (ref 0.61–1.24)
GFR, Estimated: >60 mL/min (ref >60)
Glucose, Bld: 104 mg/dL — ABNORMAL HIGH (ref 70–99)
Potassium: 3.4 mmol/L — ABNORMAL LOW (ref 3.5–5.1)
Sodium: 135 mmol/L (ref 135–145)
Total Bilirubin: 1.3 mg/dL — ABNORMAL HIGH (ref 0.3–1.2)
Total Protein: 7.5 g/dL (ref 6.5–8.1)

## 2023-05-18 LAB — ETHANOL: Alcohol, Ethyl (B): 246 mg/dL — ABNORMAL HIGH (ref <10)

## 2023-05-18 MED ORDER — MORPHINE SULFATE (PF) 4 MG/ML IV SOLN
4.0000 mg | Freq: Once | INTRAVENOUS | Status: AC
  Administered 2023-05-18: 4 mg via INTRAVENOUS
  Filled 2023-05-18: qty 1

## 2023-05-18 MED ORDER — HYDROCODONE-ACETAMINOPHEN 5-325 MG PO TABS
1.0000 | ORAL_TABLET | Freq: Once | ORAL | Status: AC
  Administered 2023-05-18: 1 via ORAL
  Filled 2023-05-18: qty 1

## 2023-05-18 MED ORDER — SODIUM CHLORIDE 0.9 % IV BOLUS
1000.0000 mL | Freq: Once | INTRAVENOUS | Status: AC
  Administered 2023-05-18: 1000 mL via INTRAVENOUS

## 2023-05-18 NOTE — Discharge Instructions (Addendum)
Drink alcohol only in moderation.  Return to the ER for worsening symptoms, persistent vomiting, difficulty breathing or other concerns. 

## 2023-05-18 NOTE — ED Provider Notes (Signed)
[facility] Provider Note  Patient Contact: 9:24 PM (approximate)   History   Fall   HPI  Jose [name] is a 62 y.o. male who presents the emergency department after a fall.  Patient states that he was sitting on top of a trash can when he went to get off he tripped as it was seen in the and he lost his balance.  Patient states that he fell forwards, tried to stand up and then fell backwards.  He is complaining of hitting his head with possible brief loss of consciousness.  Endorses headache, neck pain, left upper arm pain, low back, bilateral hip and bilateral knee pain.  Patient is able to stand and ambulate at this time.  Has been no reported repeat syncopy.  Patient states that he has abdominal distention which appears chronic in nature.  He states that he believes he has "ascites."  This is predating his fall, unchanged.  Patient denies any urinary complaints, constipation or diarrhea.  Patient admits to alcohol use, and does smell of alcohol and appear intoxicated at this time.     Physical Exam   Triage Vital Signs: ED Triage Vitals  Encounter Vitals Group     BP 05/18/23 2112 (!) 149/93     Systolic BP Percentile --      Diastolic BP Percentile --      Pulse Rate 05/18/23 2112 83     Resp 05/18/23 2112 16     Temp 05/18/23 2112 98.2 F (36.8 C)     Temp Source 05/18/23 2112 Oral     SpO2 05/18/23 2112 98 %     Weight --      Height --      Head Circumference --      Peak Flow --      Pain Score 05/18/23 2111 10     Pain Loc --      Pain Education --      Exclude from Growth Chart --     Most recent vital signs: Vitals:   05/18/23 2112  BP: (!) 149/93  Pulse: 83  Resp: 16  Temp: 98.2 F (36.8 C)  SpO2: 98%     General: Alert and in no acute distress. Eyes:  PERRL. EOMI. Head: Small hematoma to the right forehead otherwise no acute traumatic findings to the face or skull.  No battle signs, raccoon eyes, serosanguineous fluid  drainage from the ears or nares.  Neck: No stridor. No cervical spine tenderness to palpation.  Cardiovascular:  Good peripheral perfusion Respiratory: Normal respiratory effort without tachypnea or retractions. Lungs CTAB. Good air entry to the bases with no decreased or absent breath sounds. Gastrointestinal: Bowel sounds 4 quadrants. Soft and nontender to palpation. No guarding or rigidity. No palpable masses. No distention.  Musculoskeletal: Abrasion noted to the left upper extremity.  No active bleeding.  No deformity to the left upper extremity, able to move all 4 extremities at this time.  Palpation over the lower extremities reveals tenderness over the bilateral knees globally without point specific tenderness.  No palpable abnormality.  Pulse and sensation intact bilateral lower extremities. Neurologic:  No gross focal neurologic deficits are appreciated.  Patient with no gross neurodeficits on cranial nerve testing.  Patient does appear intoxicated, and somewhat sluggish and following commands but is able to complete testing. Skin:   No rash noted Other:   ED Results / Procedures / Treatments   Labs (all labs ordered are listed,  but only abnormal results are displayed) Labs Reviewed  COMPREHENSIVE METABOLIC PANEL - Abnormal; Notable for the following components:      Result Value   Potassium 3.4 (*)    Glucose, Bld 104 (*)    BUN 5 (*)    Creatinine, Ser 0.58 (*)    Albumin 3.3 (*)    AST 328 (*)    ALT 144 (*)    Total Bilirubin 1.3 (*)    All other components within normal limits  CBC WITH DIFFERENTIAL/PLATELET - Abnormal; Notable for the following components:   RBC 3.98 (*)    Platelets 112 (*)    All other components within normal limits  ETHANOL - Abnormal; Notable for the following components:   Alcohol, Ethyl (B) 246 (*)    All other components within normal limits     EKG     RADIOLOGY  I personally viewed, evaluated, and interpreted these images as part  of my medical decision making, as well as reviewing the written report by the radiologist.  ED Provider Interpretation: No acute traumatic findings on imaging of the head, neck, arm, hip or knees.  DG Humerus Left  Result Date: 05/18/2023 CLINICAL DATA:  Recent fall with left arm pain, initial encounter EXAM: LEFT HUMERUS - 2+ VIEW COMPARISON:  None Available. FINDINGS: Degenerative changes of the acromioclavicular joint are seen. No acute fracture or dislocation is noted. No soft tissue abnormality is seen. No acute rib abnormality is noted. Old rib fractures are noted on the left. IMPRESSION: No acute abnormality noted. Electronically Signed   By: Alcide Clever M.D.   On: 05/18/2023 22:41   DG Hips Bilat W or Wo Pelvis 3-4 Views  Result Date: 05/18/2023 CLINICAL DATA:  Recent fall with bilateral hip pain, initial encounter EXAM: DG HIP (WITH OR WITHOUT PELVIS) 4V BILAT COMPARISON:  None Available. FINDINGS: Pelvic ring is intact. Mild degenerative changes of the hip joints are noted. Acute fracture or dislocation is seen. No soft tissue changes are noted. IMPRESSION: No acute abnormality noted. Electronically Signed   By: Alcide Clever M.D.   On: 05/18/2023 22:39   DG Lumbar Spine 2-3 Views  Result Date: 05/18/2023 CLINICAL DATA:  Recent fall with low back pain, initial encounter EXAM: LUMBAR SPINE - 3 VIEW COMPARISON:  09/30/2021 FINDINGS: Five lumbar type vertebral bodies are well visualized. Vertebral body height is well maintained. Multilevel disc space narrowing is noted. Osteophytic changes are seen as well as facet hypertrophic change. No soft tissue changes are noted. IMPRESSION: Degenerative change without acute abnormality. Electronically Signed   By: Alcide Clever M.D.   On: 05/18/2023 22:38   DG Knee Complete 4 Views Right  Result Date: 05/18/2023 CLINICAL DATA:  Recent fall with right knee pain, initial encounter EXAM: RIGHT KNEE - COMPLETE 4+ VIEW COMPARISON:  None Available. FINDINGS:  No evidence of fracture, dislocation, or joint effusion. No evidence of arthropathy or other focal bone abnormality. Soft tissues are unremarkable. IMPRESSION: No acute abnormality noted. Electronically Signed   By: Alcide Clever M.D.   On: 05/18/2023 22:37   DG Knee Complete 4 Views Left  Result Date: 05/18/2023 CLINICAL DATA:  Recent fall with left knee pain, initial encounter EXAM: LEFT KNEE - COMPLETE 4+ VIEW COMPARISON:  None Available. FINDINGS: No evidence of fracture, dislocation, or joint effusion. No evidence of arthropathy or other focal bone abnormality. Soft tissues are unremarkable. IMPRESSION: No acute abnormality noted. Electronically Signed   By: Eulah Pont.D.  On: 05/18/2023 22:36   CT Head Wo Contrast  Result Date: 05/18/2023 CLINICAL DATA:  Fall, head and neck pain EXAM: CT HEAD WITHOUT CONTRAST CT CERVICAL SPINE WITHOUT CONTRAST TECHNIQUE: Multidetector CT imaging of the head and cervical spine was performed following the standard protocol without intravenous contrast. Multiplanar CT image reconstructions of the cervical spine were also generated. RADIATION DOSE REDUCTION: This exam was performed according to the departmental dose-optimization program which includes automated exposure control, adjustment of the mA and/or kV according to patient size and/or use of iterative reconstruction technique. COMPARISON:  11/07/2022 CT head and cervical spine FINDINGS: CT HEAD FINDINGS Brain: No evidence of acute infarct, hemorrhage, mass, mass effect, or midline shift. No hydrocephalus or extra-axial fluid collection. Periventricular white matter changes, likely the sequela of chronic small vessel ischemic disease. Redemonstrated encephalomalacia along the anterior left frontal lobe. Vascular: No hyperdense vessel. Atherosclerotic calcifications in the intracranial carotid and vertebral arteries. Skull: Negative for fracture or focal lesion. Chronic deformity of the nasal bones.  Sinuses/Orbits: No acute finding. Other: The mastoid air cells are well aerated. CT CERVICAL SPINE FINDINGS Alignment: No traumatic listhesis. Skull base and vertebrae: No acute fracture or suspicious osseous lesion. Soft tissues and spinal canal: No prevertebral fluid or swelling. No visible canal hematoma. Disc levels: Degenerative changes in the cervical spine. No significant spinal canal stenosis. Upper chest: No focal pulmonary opacity or pleural effusion. Centrilobular and paraseptal emphysema. IMPRESSION: 1. No acute intracranial process. 2. No acute fracture or traumatic listhesis in the cervical spine. 3. Emphysema. Emphysema (ICD10-J43.9). Electronically Signed   By: Wiliam Ke M.D.   On: 05/18/2023 22:06   CT Cervical Spine Wo Contrast  Result Date: 05/18/2023 CLINICAL DATA:  Fall, head and neck pain EXAM: CT HEAD WITHOUT CONTRAST CT CERVICAL SPINE WITHOUT CONTRAST TECHNIQUE: Multidetector CT imaging of the head and cervical spine was performed following the standard protocol without intravenous contrast. Multiplanar CT image reconstructions of the cervical spine were also generated. RADIATION DOSE REDUCTION: This exam was performed according to the departmental dose-optimization program which includes automated exposure control, adjustment of the mA and/or kV according to patient size and/or use of iterative reconstruction technique. COMPARISON:  11/07/2022 CT head and cervical spine FINDINGS: CT HEAD FINDINGS Brain: No evidence of acute infarct, hemorrhage, mass, mass effect, or midline shift. No hydrocephalus or extra-axial fluid collection. Periventricular white matter changes, likely the sequela of chronic small vessel ischemic disease. Redemonstrated encephalomalacia along the anterior left frontal lobe. Vascular: No hyperdense vessel. Atherosclerotic calcifications in the intracranial carotid and vertebral arteries. Skull: Negative for fracture or focal lesion. Chronic deformity of the nasal  bones. Sinuses/Orbits: No acute finding. Other: The mastoid air cells are well aerated. CT CERVICAL SPINE FINDINGS Alignment: No traumatic listhesis. Skull base and vertebrae: No acute fracture or suspicious osseous lesion. Soft tissues and spinal canal: No prevertebral fluid or swelling. No visible canal hematoma. Disc levels: Degenerative changes in the cervical spine. No significant spinal canal stenosis. Upper chest: No focal pulmonary opacity or pleural effusion. Centrilobular and paraseptal emphysema. IMPRESSION: 1. No acute intracranial process. 2. No acute fracture or traumatic listhesis in the cervical spine. 3. Emphysema. Emphysema (ICD10-J43.9). Electronically Signed   By: Wiliam Ke M.D.   On: 05/18/2023 22:06    PROCEDURES:  Critical Care performed: No  Procedures   MEDICATIONS ORDERED IN ED: Medications  HYDROcodone-acetaminophen (NORCO/VICODIN) 5-325 MG per tablet 1 tablet (has no administration in time range)  sodium chloride 0.9 % bolus 1,000 mL (1,000  mLs Intravenous New Bag/Given 05/18/23 2147)  morphine (PF) 4 MG/ML injection 4 mg (4 mg Intravenous Given 05/18/23 2149)     IMPRESSION / MDM / ASSESSMENT AND PLAN / ED COURSE  I reviewed the triage vital signs and the nursing notes.                                 Differential diagnosis includes, but is not limited to, fall, skull fracture, intracranial hemorrhage, humerus fracture, hip fracture, intoxication   Patient's presentation is most consistent with acute presentation with potential threat to life or bodily function.   Patient's diagnosis is consistent with fall, multiple contusions, intoxication.  Patient presents emergency department after 2 falls.  He states that he was standing on top of a trash can, went to get all fell forward, stood up fell backwards.  Patient did appear intoxicated on arrival and admits to EtOH use.  His alcohol level is currently 246.  Patient did have a history of a previous  intracranial hemorrhage, is no longer on blood thinners for his PE.  However the fact that he hit his head and he does appear slightly altered warranted imaging.  Imaging of the head, cervical spine is reassuring.  X-rays are reassuring at this time without acute fracture.  Patient had basic labs, it was noted that his LFTs are elevated but according to his previous labs these are roughly at baseline.  At this time patient will metabolize his alcohol until he is safe to leave.  Patient is homeless, does not have anybody to watch him to ensure his safe discharge until he is no longer intoxicated and able to take care of himself..  Patient is given ED precautions to return to the ED for any worsening or new symptoms.     FINAL CLINICAL IMPRESSION(S) / ED DIAGNOSES   Final diagnoses:  Fall, initial encounter  Multiple contusions  Alcoholic intoxication without complication (HCC)     Rx / DC Orders   ED Discharge Orders     None        Note:  This document was prepared using Dragon voice recognition software and may include unintentional dictation errors.   Lanette Hampshire 05/18/23 2302    Dionne Bucy, MD 05/19/23 [phone]

## 2023-05-18 NOTE — ED Provider Notes (Signed)
-----------------------------------------   11:59 PM on 05/18/2023 -----------------------------------------   Care assumed of patient who will be observed in the emergency department until sobriety.  He is awake, alert, conversant but unsteady on his feet.  Drinking some water at this time.  Will continue to monitor and care for patient.  ----------------------------------------- 6:32 AM on 05/19/2023 -----------------------------------------   No events overnight.  Patient continues to sleep.  Anticipate discharge home once he is awake and ambulatory with steady gait.   Irean Hong, MD 05/19/23 [phone]

## 2023-05-18 NOTE — ED Notes (Signed)
Pt ambulatory without difficulty in lobby

## 2023-05-18 NOTE — ED Triage Notes (Signed)
Pt arrives via [facility]. Reports walking down street and fell, c/o pain the left elbow. Abrasion and pain to the left elbow, Abrasion to the left upper arm and left shoulder. Both leg and buttocks pain. ETOH. Did hit his head (right forehead), no blood thinners.

## 2023-05-18 NOTE — ED Notes (Signed)
Patient states that he has had about 4 40 oz of liquor today. Patient states that he drinks several "40 oz" a day. Patient states that he fell and hit his head tonight. Patient has several skin tears to the left arm

## 2023-05-19 MED ORDER — NAPROXEN 500 MG PO TABS
500.0000 mg | ORAL_TABLET | Freq: Once | ORAL | Status: AC
  Administered 2023-05-19: 500 mg via ORAL
  Filled 2023-05-19: qty 1

## 2023-05-19 NOTE — ED Notes (Signed)
Pt awake and alert, endorsing left arm, back, abdominal pain and requesting medication and bandaids. Will notify provider. VSS, NAD at this time.

## 2023-05-19 NOTE — ED Notes (Addendum)
Pt ambulatory to and from bathroom and back to bed. Bandaid placed on pt L arm skin tear. No other obvious undressed injuries.

## 2023-05-19 NOTE — ED Notes (Signed)
Pt was ambulating in hallway with no difficulty. Pt was given box lunch and ginger ale per pt request.

## 2023-05-20 NOTE — Congregational Nurse Program (Signed)
  Dept: [phone]   Congregational Nurse Program Note  Date of Encounter: 05/20/2023 Client to [facility] day center for follow up wound care. He was seen at the [facility] ER on 8/24 following a fall. He suffered a significant skin tear to the the back of his left arm. Dressing removed and area cleaned with NS. Skin edges were able to be approximated using a sterile cotton tipped applicator. No bleeding noted. Area covered with a nonadherent dressing and secured with paper tape. RN encouraged client to return to the center tomorrow for dressing change. He voiced understanding and agreement. Jose [name] BSN, RN Past Medical History: Past Medical History:  Diagnosis Date   EtOH dependence (HCC)    Hep C w/o coma, chronic (HCC)    Hypertension    Liver cirrhosis, alcoholic (HCC)    Polysubstance abuse (HCC)    Pulmonary embolism (HCC)     Encounter Details:  CNP Questionnaire - 05/20/23 1030       Questionnaire   Ask client: Do you give verbal consent for me to treat you today? Yes    Student Assistance N/A    Location Patient Served  [facility]    Visit Setting with Client Organization    Patient Status Unhoused    Insurance Medicaid   [place] Access   Insurance/Financial Assistance Referral N/A    Medication N/A    Medical Provider Yes   client was seen in July 2023, has not rescheduled follow up that was due in October   Screening Referrals Made N/A    Medical Referrals Made N/A    Medical Appointment Made N/A    Recently w/o PCP, now 1st time PCP visit completed due to CNs referral or appointment made N/A    Food Have Food Insecurities   69.00 in FNS benefit/month   Transportation N/A   client walks to nearby places. Does have [employer] transportation   Housing/Utilities No permanent housing   Client is currenlty sleeping in a car in an impound lot   Interpersonal Safety N/A    Interventions Advocate/Support    Abnormal to Normal Screening Since Last CN Visit N/A     Screenings CN Performed N/A    Sent Client to Lab for: N/A    Did client attend any of the following based off CNs referral or appointments made? N/A   client has been getting lunch at a small church on [address] he is aware of the food at the [employer] and [employer] shelter   ED Visit Averted N/A    Life-Saving Intervention Made N/A

## 2023-05-21 ENCOUNTER — Other Ambulatory Visit: Payer: Self-pay

## 2023-05-21 ENCOUNTER — Encounter: Payer: Self-pay | Admitting: Emergency Medicine

## 2023-05-21 ENCOUNTER — Emergency Department
Admission: EM | Admit: 2023-05-21 | Discharge: 2023-05-21 | Discharge status: Against Medical Advice | Payer: MEDICAID | Attending: Emergency Medicine | Admitting: Emergency Medicine

## 2023-05-21 DIAGNOSIS — Z789 Other specified health status: Secondary | ICD-10-CM

## 2023-05-21 DIAGNOSIS — Z5321 Procedure and treatment not carried out due to patient leaving prior to being seen by health care provider: Secondary | ICD-10-CM | POA: Insufficient documentation

## 2023-05-21 DIAGNOSIS — F10220 Alcohol dependence with intoxication, uncomplicated: Secondary | ICD-10-CM | POA: Insufficient documentation

## 2023-05-21 NOTE — ED Notes (Signed)
Patient states that he's leaving because we aren't going to do anything for him. Patient is frustrated because he is in the hall and not a room, he says. Patient says "you guys are just pieces of shit, and no one gives a fuck about me". Patient has steady gate as he ambulates and is alert and oriented at this time.

## 2023-05-21 NOTE — ED Provider Notes (Signed)
62 year old male who presented with detox request.  Seen a few days ago in our ER after a fall in the setting of acute alcohol intoxication.  He is observed until sobriety.  Presented to triage today requesting detox and with chronic pain.  As I was headed to see the patient, I was informed by the RN that patient has decided to leave.  He was reportedly alert and oriented with steady gait.  I did not personally evaluate this patient prior to his decision to leave the ER before completion of his assessment.   Trinna Post, MD 05/21/23 2053

## 2023-05-21 NOTE — ED Triage Notes (Signed)
Pt presents ambulatory to triage via POV with for alcohol detox and chronic generalized body pain. Pt states that he had several beers today and wants to stop drinking but it helps with his pain. Pt also needing a place to stay while he attempts to detox. A&Ox4 at this time. Denies CP or SOB.

## 2023-05-23 NOTE — Congregational Nurse Program (Signed)
  Dept: [phone]   Congregational Nurse Program Note  Date of Encounter: 05/23/2023 client into nurse only clinic requesting re-dressing of abrasion to left deltoid area from gall earlier in week. After client showered, abrasion coated with thin layer of vaseoline and redressed. Large area healing with open, pink areas around perimeter. No discharge. Counseled to keep clean and dry. Follow up with nurse only clinic prn. Rhermann, RN Past Medical History: Past Medical History:  Diagnosis Date   EtOH dependence (HCC)    Hep C w/o coma, chronic (HCC)    Hypertension    Liver cirrhosis, alcoholic (HCC)    Polysubstance abuse (HCC)    Pulmonary embolism (HCC)     Encounter Details:  CNP Questionnaire - 05/23/23 1115       Questionnaire   Ask client: Do you give verbal consent for me to treat you today? Yes    Student Assistance N/A    Location Patient Served  [facility]    Visit Setting with Client Organization    Patient Status Unhoused    Insurance Medicaid   [place] Access   Insurance/Financial Assistance Referral N/A    Medication N/A    Medical Provider Yes   client was seen in July 2023, has not rescheduled follow up that was due in October   Screening Referrals Made N/A    Medical Referrals Made N/A    Medical Appointment Made N/A    Recently w/o PCP, now 1st time PCP visit completed due to CNs referral or appointment made N/A    Food Have Food Insecurities   69.00 in FNS benefit/month   Transportation N/A   client walks to nearby places. Does have [employer] transportation   Housing/Utilities No permanent housing   Client is currenlty sleeping in a car in an impound lot   Interpersonal Safety N/A    Interventions Advocate/Support    Abnormal to Normal Screening Since Last CN Visit N/A    Screenings CN Performed N/A    Sent Client to Lab for: N/A    Did client attend any of the following based off CNs referral or appointments made? N/A   client has been getting  lunch at a small church on [address]. he is aware of the food at the [employer] and [employer] shelter   ED Visit Averted N/A    Life-Saving Intervention Made N/A

## 2023-05-28 NOTE — Congregational Nurse Program (Signed)
  Dept: [phone]   Congregational Nurse Program Note  Date of Encounter: 05/28/2023 Client to [facility] day center for continued first aid/dressing change to skin tear on upper left arm. Client to RN office after his shower. Area clean and healing well, skin intact, no s/s of infection, no drainage. Area still requires protection as not to reinjure the skin. Area redressed with non-adhesive dressing and secured with wide paper tape.  [name] Past Medical History: Past Medical History:  Diagnosis Date   EtOH dependence (HCC)    Hep C w/o coma, chronic (HCC)    Hypertension    Liver cirrhosis, alcoholic (HCC)    Polysubstance abuse (HCC)    Pulmonary embolism (HCC)     Encounter Details:  CNP Questionnaire - 05/28/23 1130       Questionnaire   Ask client: Do you give verbal consent for me to treat you today? Yes    Student Assistance N/A    Location Patient Served   [facility]    Visit Setting with Client Organization    Patient Status Unhoused    Insurance Medicaid   [place] Access   Insurance/Financial Assistance Referral N/A    Medication N/A    Medical Provider Yes   client was seen in July 2023, has not rescheduled follow up that was due in October   Screening Referrals Made N/A    Medical Referrals Made N/A    Medical Appointment Made N/A    Recently w/o PCP, now 1st time PCP visit completed due to CNs referral or appointment made N/A    Food Have Food Insecurities   69.00 in FNS benefit/month   Transportation N/A   client walks to nearby places. Does have [employer] transportation   Housing/Utilities No permanent housing   Client is currenlty sleeping in a car in an impound lot   Interpersonal Safety N/A    Interventions Advocate/Support    Abnormal to Normal Screening Since Last CN Visit N/A    Screenings CN Performed N/A    Sent Client to Lab for: N/A    Did client attend any of the following based off CNs referral or appointments made? N/A    client has been getting lunch at a small church on [address] he is aware of the food at the [employer] and [employer] shelter   ED Visit Averted N/A    Life-Saving Intervention Made N/A

## 2023-05-29 NOTE — Congregational Nurse Program (Signed)
  Dept: [phone]   Congregational Nurse Program Note  Date of Encounter: 05/29/2023 Client to [facility] day center for dressing change to healing skin tear on the back of his left upper arm. Skin remains intact, no drainage. Non-adhesive dressing applied. Jose [name] BSN, RN Past Medical History: Past Medical History:  Diagnosis Date   EtOH dependence (HCC)    Hep C w/o coma, chronic (HCC)    Hypertension    Liver cirrhosis, alcoholic (HCC)    Polysubstance abuse (HCC)    Pulmonary embolism (HCC)     Encounter Details:  CNP Questionnaire - 05/29/23 1130       Questionnaire   Ask client: Do you give verbal consent for me to treat you today? Yes    Student Assistance N/A    Location Patient Served  [facility]    Visit Setting with Client Organization    Patient Status Unhoused    Insurance Medicaid   [place] Access   Insurance/Financial Assistance Referral N/A    Medication N/A    Medical Provider Yes   client was seen in July 2023, has not rescheduled follow up that was due in October   Screening Referrals Made N/A    Medical Referrals Made N/A    Medical Appointment Made N/A    Recently w/o PCP, now 1st time PCP visit completed due to CNs referral or appointment made N/A    Food Have Food Insecurities   69.00 in FNS benefit/month   Transportation N/A   client walks to nearby places. Does have [employer] transportation   Housing/Utilities No permanent housing   Client is currenlty sleeping in a car in an impound lot   Interpersonal Safety N/A    Interventions Advocate/Support    Abnormal to Normal Screening Since Last CN Visit N/A    Screenings CN Performed N/A    Sent Client to Lab for: N/A    Did client attend any of the following based off CNs referral or appointments made? N/A   client has been getting lunch at a small church on [address] he is aware of the food at the [employer] and [employer] shelter   ED Visit Averted N/A    Life-Saving  Intervention Made N/A

## 2023-06-04 NOTE — Congregational Nurse Program (Signed)
  Dept: [phone]   Congregational Nurse Program Note  Date of Encounter: 06/04/2023 Client to [facility] day center with need of first aid. Abrasion to the outer lower left calf. Area clean after his shower. Covered with non adherent dressing. Continued education regarding skin care and sun protection. Past Medical History: Past Medical History:  Diagnosis Date   EtOH dependence (HCC)    Hep C w/o coma, chronic (HCC)    Hypertension    Liver cirrhosis, alcoholic (HCC)    Polysubstance abuse (HCC)    Pulmonary embolism (HCC)     Encounter Details:  CNP Questionnaire - 06/04/23 1100       Questionnaire   Ask client: Do you give verbal consent for me to treat you today? Yes    Student Assistance N/A    Location Patient Served  [facility]    Visit Setting with Client Organization    Patient Status Unhoused    Insurance Medicaid   [place] Access   Insurance/Financial Assistance Referral N/A    Medication N/A    Medical Provider Yes   client was seen in July 2023, has not rescheduled follow up that was due in October   Screening Referrals Made N/A    Medical Referrals Made N/A    Medical Appointment Made N/A    Recently w/o PCP, now 1st time PCP visit completed due to CNs referral or appointment made N/A    Food Have Food Insecurities   69.00 in FNS benefit/month   Transportation N/A   client walks to nearby places. Does have [employer] transportation   Housing/Utilities No permanent housing   Client is currenlty sleeping in a car in an impound lot   Interpersonal Safety N/A    Interventions Advocate/Support    Abnormal to Normal Screening Since Last CN Visit N/A    Screenings CN Performed N/A    Sent Client to Lab for: N/A    Did client attend any of the following based off CNs referral or appointments made? N/A   client has been getting lunch at a small church on [address] he is aware of the food at the [employer] and [employer] shelter   ED Visit  Averted N/A    Life-Saving Intervention Made N/A

## 2023-06-05 NOTE — Congregational Nurse Program (Signed)
  Dept: [phone]   Congregational Nurse Program Note  Date of Encounter: 06/05/2023 Client to [facility] day center with need of first aid and emotional support. Abrasion to left lower, outer calf cleaned and covered with non-adhesive dressing. Toe nails trimmed. [name] BSN, RN Past Medical History: Past Medical History:  Diagnosis Date   EtOH dependence (HCC)    Hep C w/o coma, chronic (HCC)    Hypertension    Liver cirrhosis, alcoholic (HCC)    Polysubstance abuse (HCC)    Pulmonary embolism (HCC)     Encounter Details:  CNP Questionnaire - 06/05/23 1200       Questionnaire   Ask client: Do you give verbal consent for me to treat you today? Yes    Student Assistance N/A    Location Patient Served  [facility]    Visit Setting with Client Organization    Patient Status Unhoused    Insurance Medicaid   [place] Access   Insurance/Financial Assistance Referral N/A    Medication N/A    Medical Provider Yes   client was seen in July 2023, has not rescheduled follow up that was due in October   Screening Referrals Made N/A    Medical Referrals Made N/A    Medical Appointment Made N/A    Recently w/o PCP, now 1st time PCP visit completed due to CNs referral or appointment made N/A    Food Have Food Insecurities   69.00 in FNS benefit/month   Transportation N/A   client walks to nearby places. Does have [employer] transportation   Housing/Utilities No permanent housing   Client is currenlty sleeping in a car in an impound lot   Interpersonal Safety N/A    Interventions Advocate/Support;Educate    Abnormal to Normal Screening Since Last CN Visit N/A    Screenings CN Performed N/A    Sent Client to Lab for: N/A    Did client attend any of the following based off CNs referral or appointments made? N/A   client has been getting lunch at a small church on [address] he is aware of the food at the [employer] and [employer] shelter   ED Visit Averted N/A     Life-Saving Intervention Made N/A

## 2023-06-06 NOTE — Congregational Nurse Program (Signed)
  Dept: [phone]   Congregational Nurse Program Note  Date of Encounter: 06/06/2023 Client to [facility] day center for continued bandage changes to the abrasion on his outer lower left calf. Foot care also provided. No other needs at this time. [name] BSN, RN Past Medical History: Past Medical History:  Diagnosis Date   EtOH dependence (HCC)    Hep C w/o coma, chronic (HCC)    Hypertension    Liver cirrhosis, alcoholic (HCC)    Polysubstance abuse (HCC)    Pulmonary embolism (HCC)     Encounter Details:  CNP Questionnaire - 06/06/23 1200       Questionnaire   Ask client: Do you give verbal consent for me to treat you today? Yes    Student Assistance N/A    Location Patient Served  [facility]    Visit Setting with Client Organization    Patient Status Unhoused    Insurance Medicaid   [place] Access   Insurance/Financial Assistance Referral N/A    Medication N/A    Medical Provider Yes   client was seen in July 2023, has not rescheduled follow up that was due in October   Screening Referrals Made N/A    Medical Referrals Made N/A    Medical Appointment Made N/A    Recently w/o PCP, now 1st time PCP visit completed due to CNs referral or appointment made N/A    Food Have Food Insecurities   69.00 in FNS benefit/month   Transportation N/A   client walks to nearby places. Does have [employer] transportation   Housing/Utilities No permanent housing   Client is currenlty sleeping in a car in an impound lot   Interpersonal Safety N/A    Interventions Advocate/Support;Educate    Abnormal to Normal Screening Since Last CN Visit N/A    Screenings CN Performed N/A    Sent Client to Lab for: N/A    Did client attend any of the following based off CNs referral or appointments made? N/A   client has been getting lunch at a small church on [address] he is aware of the food at the [employer] and [employer] shelter   ED Visit Averted N/A    Life-Saving  Intervention Made N/A

## 2023-06-08 ENCOUNTER — Emergency Department: Payer: MEDICAID

## 2023-06-08 ENCOUNTER — Other Ambulatory Visit: Payer: Self-pay

## 2023-06-08 DIAGNOSIS — Y908 Blood alcohol level of 240 mg/100 ml or more: Secondary | ICD-10-CM | POA: Insufficient documentation

## 2023-06-08 DIAGNOSIS — G894 Chronic pain syndrome: Secondary | ICD-10-CM | POA: Insufficient documentation

## 2023-06-08 DIAGNOSIS — R079 Chest pain, unspecified: Secondary | ICD-10-CM | POA: Diagnosis present

## 2023-06-08 DIAGNOSIS — R0789 Other chest pain: Secondary | ICD-10-CM | POA: Diagnosis not present

## 2023-06-08 LAB — ETHANOL: Alcohol, Ethyl (B): 276 mg/dL — ABNORMAL HIGH (ref <10)

## 2023-06-08 LAB — CBC
HCT: 39 % (ref 39.0–52.0)
Hemoglobin: 13.1 g/dL (ref 13.0–17.0)
MCH: 33.6 pg (ref 26.0–34.0)
MCHC: 33.6 g/dL (ref 30.0–36.0)
MCV: 100 fL (ref 80.0–100.0)
Platelets: 118 10*3/uL — ABNORMAL LOW (ref 150–400)
RBC: 3.9 MIL/uL — ABNORMAL LOW (ref 4.22–5.81)
RDW: 12.9 % (ref 11.5–15.5)
WBC: 7.6 10*3/uL (ref 4.0–10.5)
nRBC: 0 % (ref 0.0–0.2)

## 2023-06-08 LAB — BASIC METABOLIC PANEL
Anion gap: 10 (ref 5–15)
BUN: <5 mg/dL — ABNORMAL LOW (ref 6–20)
CO2: 24 mmol/L (ref 22–32)
Calcium: 9.5 mg/dL (ref 8.9–10.3)
Chloride: 101 mmol/L (ref 98–111)
Creatinine, Ser: 0.59 mg/dL — ABNORMAL LOW (ref 0.61–1.24)
GFR, Estimated: >60 mL/min (ref >60)
Glucose, Bld: 135 mg/dL — ABNORMAL HIGH (ref 70–99)
Potassium: 3.4 mmol/L — ABNORMAL LOW (ref 3.5–5.1)
Sodium: 135 mmol/L (ref 135–145)

## 2023-06-08 LAB — TROPONIN I (HIGH SENSITIVITY): Troponin I (High Sensitivity): 8 ng/L (ref <18)

## 2023-06-08 NOTE — ED Triage Notes (Signed)
Pt to ed from group home via [facility] for CP. Prs caox4, in no acute distress in triage. Pt is obviously intoxicated. Pt has multiple complaints. This RN is familiar with the pt.

## 2023-06-09 ENCOUNTER — Emergency Department
Admission: EM | Admit: 2023-06-09 | Discharge: 2023-06-09 | Disposition: A | Discharge status: Home / Self Care | Payer: MEDICAID | Attending: Emergency Medicine | Admitting: Emergency Medicine

## 2023-06-09 DIAGNOSIS — G894 Chronic pain syndrome: Secondary | ICD-10-CM

## 2023-06-09 DIAGNOSIS — R0789 Other chest pain: Secondary | ICD-10-CM

## 2023-06-09 LAB — HEPATIC FUNCTION PANEL
ALT: 136 U/L — ABNORMAL HIGH (ref 0–44)
AST: 316 U/L — ABNORMAL HIGH (ref 15–41)
Albumin: 3.3 g/dL — ABNORMAL LOW (ref 3.5–5.0)
Alkaline Phosphatase: 83 U/L (ref 38–126)
Bilirubin, Direct: 0.8 mg/dL — ABNORMAL HIGH (ref 0.0–0.2)
Indirect Bilirubin: 1.2 mg/dL — ABNORMAL HIGH (ref 0.3–0.9)
Total Bilirubin: 2 mg/dL — ABNORMAL HIGH (ref 0.3–1.2)
Total Protein: 7.8 g/dL (ref 6.5–8.1)

## 2023-06-09 LAB — TROPONIN I (HIGH SENSITIVITY): Troponin I (High Sensitivity): 8 ng/L (ref <18)

## 2023-06-09 LAB — LIPASE, BLOOD: Lipase: 35 U/L (ref 11–51)

## 2023-06-09 MED ORDER — IBUPROFEN 600 MG PO TABS: 600.0000 mg | ORAL_TABLET | Freq: Three times a day (TID) | ORAL | 0 refills | Status: DC | PRN

## 2023-06-09 MED ORDER — MAGNESIUM SULFATE 2 GM/50ML IV SOLN
2.0000 g | Freq: Once | INTRAVENOUS | Status: AC
  Administered 2023-06-09: 2 g via INTRAVENOUS
  Filled 2023-06-09: qty 50

## 2023-06-09 MED ORDER — DIPHENHYDRAMINE HCL 50 MG/ML IJ SOLN
25.0000 mg | Freq: Once | INTRAMUSCULAR | Status: AC
  Administered 2023-06-09: 25 mg via INTRAVENOUS
  Filled 2023-06-09: qty 1

## 2023-06-09 MED ORDER — ONDANSETRON 4 MG PO TBDP: 4.0000 mg | ORAL_TABLET | Freq: Three times a day (TID) | ORAL | 0 refills | Status: DC | PRN

## 2023-06-09 MED ORDER — POTASSIUM CHLORIDE 10 MEQ/100ML IV SOLN
10.0000 meq | INTRAVENOUS | Status: AC
  Administered 2023-06-09: 10 meq via INTRAVENOUS
  Filled 2023-06-09 (×2): qty 100

## 2023-06-09 MED ORDER — HALOPERIDOL LACTATE 5 MG/ML IJ SOLN
2.5000 mg | Freq: Once | INTRAMUSCULAR | Status: AC
  Administered 2023-06-09: 2.5 mg via INTRAVENOUS
  Filled 2023-06-09: qty 1

## 2023-06-09 NOTE — ED Provider Notes (Signed)
[facility] Provider Note    Event Date/Time   First MD Initiated Contact with Patient 06/09/23 0022     (approximate)   History   Chest Pain   HPI  Lamell Bohlander is a 62 y.o. male 62 year old male with history of chronic pain, alcohol abuse, well-known to this ED, here with multiple complaints.  The patient states that he hurts "everywhere" and is requesting for IV Dilaudid.  He states that his main issue is that he hurts in his bilateral feet from walking as he is homeless, his chest, and abdomen.  He states he has been drinking and has had some nausea vomiting.  He denies any fevers.  Denies any direct trauma.  No other complaints.     Physical Exam   Triage Vital Signs: ED Triage Vitals  Encounter Vitals Group     BP 06/08/23 2056 121/64     Systolic BP Percentile --      Diastolic BP Percentile --      Pulse Rate 06/08/23 2055 89     Resp 06/08/23 2055 16     Temp 06/08/23 2055 98 F (36.7 C)     Temp Source 06/08/23 2055 Oral     SpO2 06/08/23 2055 98 %     Weight --      Height --      Head Circumference --      Peak Flow --      Pain Score 06/08/23 2055 0     Pain Loc --      Pain Education --      Exclude from Growth Chart --     Most recent vital signs: Vitals:   06/09/23 0430 06/09/23 0500  BP: 124/77 132/83  Pulse: (!) 59 71  Resp: 16 17  Temp:  98.4 F (36.9 C)  SpO2: 96% 97%     General: Awake, no distress.  CV:  Good peripheral perfusion.  Regular rate and rhythm.  No murmurs. Resp:  Normal work of breathing.  Lungs clear to auscultation bilaterally. Abd:  No distention.  Mild epigastric tenderness.  No guarding or rebound.  No right upper quadrant pain. Other:  Intoxicated, but in no acute distress.  No focal neurological deficits.   ED Results / Procedures / Treatments   Labs (all labs ordered are listed, but only abnormal results are displayed) Labs Reviewed  BASIC METABOLIC PANEL - Abnormal; Notable  for the following components:      Result Value   Potassium 3.4 (*)    Glucose, Bld 135 (*)    BUN <5 (*)    Creatinine, Ser 0.59 (*)    All other components within normal limits  CBC - Abnormal; Notable for the following components:   RBC 3.90 (*)    Platelets 118 (*)    All other components within normal limits  ETHANOL - Abnormal; Notable for the following components:   Alcohol, Ethyl (B) 276 (*)    All other components within normal limits  HEPATIC FUNCTION PANEL - Abnormal; Notable for the following components:   Albumin 3.3 (*)    AST 316 (*)    ALT 136 (*)    Total Bilirubin 2.0 (*)    Bilirubin, Direct 0.8 (*)    Indirect Bilirubin 1.2 (*)    All other components within normal limits  LIPASE, BLOOD  TROPONIN I (HIGH SENSITIVITY)  TROPONIN I (HIGH SENSITIVITY)     EKG Normal sinus rhythm, Triklo rate  99.  PR 174, QRS 146, QTc 518.  No acute ST elevations or depressions.  No EKG evidence of acute ischemia or infarct.   RADIOLOGY Chest x-ray: Clear   I also independently reviewed and agree with radiologist interpretations.   PROCEDURES:  Critical Care performed: No   MEDICATIONS ORDERED IN ED: Medications  potassium chloride 10 mEq in 100 mL IVPB (0 mEq Intravenous Stopped 06/09/23 0441)  magnesium sulfate IVPB 2 g 50 mL (0 g Intravenous Stopped 06/09/23 0334)  haloperidol lactate (HALDOL) injection 2.5 mg (2.5 mg Intravenous Given 06/09/23 0248)  diphenhydrAMINE (BENADRYL) injection 25 mg (25 mg Intravenous Given 06/09/23 0248)     IMPRESSION / MDM / ASSESSMENT AND PLAN / ED COURSE  I reviewed the triage vital signs and the nursing notes.                              Differential diagnosis includes, but is not limited to, chronic chest pain, chronic pain, alcohol intoxication, gastritis, GERD, pneumonia, aspiration, musculoskeletal pain  Patient's presentation is most consistent with acute presentation with potential threat to life or bodily  function.  The patient is on the cardiac monitor to evaluate for evidence of arrhythmia and/or significant heart rate changes   62 year old male well-known to this ED here with recurrent chest and diffuse body pain.  EKG is nonischemic and troponins are negative x 2, do not suspect ACS.  Chest x-ray shows no pneumonia or other abnormality.  His alcohol is significantly elevated which I suspect is the etiology of some of his complaints, as well as likely mild gastritis.  Has had no vomiting and is tolerating p.o.  His lab work is otherwise at his baseline.  He has chronic transaminitis and hyperbilirubinemia likely secondary to his alcoholic cirrhosis.  No evidence suggest significant GI bleed.  Patient making multiple requests for IV analgesia which I do not think are indicated for his likely chronic pain.  Will discharge with supportive care.  His electrolytes were repleted.  He feels better with symptomatic control in the ED.   FINAL CLINICAL IMPRESSION(S) / ED DIAGNOSES   Final diagnoses:  Atypical chest pain  Chronic pain syndrome     Rx / DC Orders   ED Discharge Orders          Ordered    ibuprofen (ADVIL) 600 MG tablet  Every 8 hours PRN        06/09/23 0452    ondansetron (ZOFRAN-ODT) 4 MG disintegrating tablet  Every 8 hours PRN        06/09/23 0452             Note:  This document was prepared using Dragon voice recognition software and may include unintentional dictation errors.   Shaune Pollack, MD 06/09/23 [id number]

## 2023-06-12 NOTE — Congregational Nurse Program (Signed)
  Dept: [phone]   Congregational Nurse Program Note  Date of Encounter: 06/12/2023 Client to [facility] day center for continued wound care to skin abrasion to his outer left lower calf. Area continues to be pink and moist, no drainage. Non-adhesive dressing applied, to be changed daily. [name] BSN, RN Past Medical History: Past Medical History:  Diagnosis Date   EtOH dependence (HCC)    Hep C w/o coma, chronic (HCC)    Hypertension    Liver cirrhosis, alcoholic (HCC)    Polysubstance abuse (HCC)    Pulmonary embolism (HCC)     Encounter Details:  CNP Questionnaire - 06/12/23 1117       Questionnaire   Ask client: Do you give verbal consent for me to treat you today? Yes    Student Assistance N/A    Location Patient Served  [facility]    Visit Setting with Client Organization    Patient Status Unhoused    Insurance Medicaid   [place] Access   Insurance/Financial Assistance Referral N/A    Medication N/A    Medical Provider Yes   client was seen in July 2023, has not rescheduled follow up that was due in October   Screening Referrals Made N/A    Medical Referrals Made N/A    Medical Appointment Made N/A    Recently w/o PCP, now 1st time PCP visit completed due to CNs referral or appointment made N/A    Food Have Food Insecurities   69.00 in FNS benefit/month   Transportation N/A   client walks to nearby places. Does have [employer] transportation   Housing/Utilities No permanent housing   Client is currenlty sleeping in a car in an impound lot   Interpersonal Safety N/A    Interventions Advocate/Support;Educate    Abnormal to Normal Screening Since Last CN Visit N/A    Screenings CN Performed N/A    Sent Client to Lab for: N/A    Did client attend any of the following based off CNs referral or appointments made? N/A   client has been getting lunch at a small church on [address] he is aware of the food at the [employer] and [employer] shelter    ED Visit Averted N/A    Life-Saving Intervention Made N/A

## 2023-06-13 NOTE — Congregational Nurse Program (Signed)
  Dept: [phone]   Congregational Nurse Program Note  Date of Encounter: 06/13/2023 Client to [facility] day center for continued wound care/first aid to abrasion to lower left calf. Area cleansed and covered with nonadherent dressing. Abrasion healing well. No other concerns at this time.  [name] BSN, RN Past Medical History: Past Medical History:  Diagnosis Date   EtOH dependence (HCC)    Hep C w/o coma, chronic (HCC)    Hypertension    Liver cirrhosis, alcoholic (HCC)    Polysubstance abuse (HCC)    Pulmonary embolism (HCC)     Encounter Details:  CNP Questionnaire - 06/13/23 1135       Questionnaire   Ask client: Do you give verbal consent for me to treat you today? Yes    Student Assistance N/A    Location Patient Served  [facility]    Visit Setting with Client Organization    Patient Status Unhoused    Insurance Medicaid   [place] Access   Insurance/Financial Assistance Referral N/A    Medication N/A    Medical Provider Yes   client was seen in July 2023, has not rescheduled follow up that was due in October   Screening Referrals Made N/A    Medical Referrals Made N/A    Medical Appointment Made N/A    Recently w/o PCP, now 1st time PCP visit completed due to CNs referral or appointment made N/A    Food Have Food Insecurities   69.00 in FNS benefit/month   Transportation N/A   client walks to nearby places. Does have [employer] transportation   Housing/Utilities No permanent housing   Client is currenlty sleeping in a car in an impound lot   Interpersonal Safety N/A    Interventions Advocate/Support;Educate    Abnormal to Normal Screening Since Last CN Visit N/A    Screenings CN Performed N/A    Sent Client to Lab for: N/A    Did client attend any of the following based off CNs referral or appointments made? N/A   client has been getting lunch at a small church on [address] he is aware of the food at the [employer] and [employer] shelter    ED Visit Averted N/A    Life-Saving Intervention Made N/A

## 2023-06-18 NOTE — Congregational Nurse Program (Signed)
  Dept: [phone]   Congregational Nurse Program Note  Date of Encounter: 06/18/2023 Client to [facility] day for skin care and foot care. Client had new areas of abrasions and pruritus/rash to the area distal to both elbows. Question poison oak vs insect bites. Hydrocortisone cream applied. Area to outer lower left calf has healed. Area appears dry, moisture lotion applied. Client continues with painful foot soles. The skin appears pocked and peeling. Antifungal powder applied. RN discussed seeing a podiatrist. Client has difficulty following through with appointments. RN to continue to recommend and encourage client to seek medical help.  [name] BSN, RN Past Medical History: Past Medical History:  Diagnosis Date   EtOH dependence (HCC)    Hep C w/o coma, chronic (HCC)    Hypertension    Liver cirrhosis, alcoholic (HCC)    Polysubstance abuse (HCC)    Pulmonary embolism (HCC)     Encounter Details:  CNP Questionnaire - 06/18/23 1215       Questionnaire   Ask client: Do you give verbal consent for me to treat you today? Yes    Student Assistance N/A    Location Patient Served  [facility]    Visit Setting with Client Organization    Patient Status Unhoused    Insurance Medicaid   [place] Access   Insurance/Financial Assistance Referral N/A    Medication N/A    Medical Provider Yes   client was seen in July 2023, has not rescheduled follow up that was due in October   Screening Referrals Made N/A    Medical Referrals Made N/A    Medical Appointment Made N/A    Recently w/o PCP, now 1st time PCP visit completed due to CNs referral or appointment made N/A    Food Have Food Insecurities   69.00 in FNS benefit/month   Transportation N/A   client walks to nearby places. Does have [employer] transportation   Housing/Utilities No permanent housing   Client is currenlty sleeping in a car in an impound lot   Interpersonal Safety N/A    Interventions  Advocate/Support;Educate    Abnormal to Normal Screening Since Last CN Visit N/A    Screenings CN Performed N/A    Sent Client to Lab for: N/A    Did client attend any of the following based off CNs referral or appointments made? N/A   client has been getting lunch at a small church on [address] he is aware of the food at the [employer] and [employer] shelter   ED Visit Averted N/A    Life-Saving Intervention Made N/A

## 2023-06-26 NOTE — Congregational Nurse Program (Signed)
  Dept: [phone]   Congregational Nurse Program Note  Date of Encounter: 06/26/2023 Client to [facility] day center with complaints of feeling not well. He reported some chills and cough last evening.BP (!) 140/68 (BP Location: Left Arm, Patient Position: Sitting, Cuff Size: Normal)   Pulse (!) 103   Temp 99.3 F (37.4 C) Comment: oral  SpO2 98% . Client took a shower and reported "feeling better". Foot care provided. RN encouraged client to rest and hydrate with non alcoholic liquids. Support given.  [name] BSN, RN  Past Medical History: Past Medical History:  Diagnosis Date   EtOH dependence (HCC)    Hep C w/o coma, chronic (HCC)    Hypertension    Liver cirrhosis, alcoholic (HCC)    Polysubstance abuse (HCC)    Pulmonary embolism (HCC)     Encounter Details:  CNP Questionnaire - 06/26/23 1100       Questionnaire   Ask client: Do you give verbal consent for me to treat you today? Yes    Student Assistance N/A    Location Patient Served  [facility]    Visit Setting with Client Organization    Patient Status Unhoused    Insurance Medicaid   [place] Access   Insurance/Financial Assistance Referral N/A    Medication N/A    Medical Provider Yes   client was seen in July 2023, has not rescheduled follow up that was due in October   Screening Referrals Made N/A    Medical Referrals Made N/A    Medical Appointment Made N/A    Recently w/o PCP, now 1st time PCP visit completed due to CNs referral or appointment made N/A    Food Have Food Insecurities   69.00 in F[place] benefit/month   Transportation N/A   client walks to nearby places. Does have [employer] transportation   Housing/Utilities No permanent housing   Client is currenlty sleeping in a car in an impound lot   Interpersonal Safety N/A    Interventions Advocate/Support;Educate    Abnormal to Normal Screening Since Last CN Visit N/A    Screenings CN Performed Blood Pressure;Temperature;Pulse Ox    Sent  Client to Lab for: N/A    Did client attend any of the following based off CNs referral or appointments made? N/A   client has been getting lunch at a small church on [address] he is aware of the food at the [employer] and [employer] shelter   ED Visit Averted N/A    Life-Saving Intervention Made N/A

## 2023-06-27 NOTE — Congregational Nurse Program (Signed)
  Dept: [phone]   Congregational Nurse Program Note  Date of Encounter: 06/27/2023 Client to [facility] day center with request for foot care. Care provided, nails trimmed. Athlete's foot cream applied. Client with +1 pitting edema to his ankles, up to mid-shin., no edema noted to his feet. No shortness of breath or other changes. He does drink alcohol daily. No other needs at this time. [name] BSN, RN Past Medical History: Past Medical History:  Diagnosis Date   EtOH dependence (HCC)    Hep C w/o coma, chronic (HCC)    Hypertension    Liver cirrhosis, alcoholic (HCC)    Polysubstance abuse (HCC)    Pulmonary embolism (HCC)     Encounter Details:  CNP Questionnaire - 06/27/23 1030       Questionnaire   Ask client: Do you give verbal consent for me to treat you today? Yes    Student Assistance N/A    Location Patient Served  [facility]    Visit Setting with Client Organization    Patient Status Unhoused    Insurance Medicaid   [place] Access   Insurance/Financial Assistance Referral N/A    Medication N/A    Medical Provider Yes   client was seen in July 2023, has not rescheduled follow up that was due in October   Screening Referrals Made N/A    Medical Referrals Made N/A    Medical Appointment Made N/A    Recently w/o PCP, now 1st time PCP visit completed due to CNs referral or appointment made N/A    Food Have Food Insecurities   69.00 in FNS benefit/month   Transportation N/A   client walks to nearby places. Does have [employer] transportation   Housing/Utilities No permanent housing   Client is currenlty sleeping in a car in an impound lot   Interpersonal Safety N/A    Interventions Advocate/Support;Educate    Abnormal to Normal Screening Since Last CN Visit N/A    Screenings CN Performed N/A    Sent Client to Lab for: N/A    Did client attend any of the following based off CNs referral or appointments made? N/A   client has been getting lunch at a small  church on [address]. he is aware of the food at the [employer] and [employer] shelter   ED Visit Averted N/A    Life-Saving Intervention Made N/A

## 2023-07-02 NOTE — Congregational Nurse Program (Signed)
Dept: [phone]   Congregational Nurse Program Note  Date of Encounter: 07/02/2023  Past Medical History: Past Medical History:  Diagnosis Date   EtOH dependence (HCC)    Hep C w/o coma, chronic (HCC)    Hypertension    Liver cirrhosis, alcoholic (HCC)    Polysubstance abuse (HCC)    Pulmonary embolism (HCC)     Encounter Details:  CNP Questionnaire - 07/02/23 1232       Questionnaire   Ask client: Do you give verbal consent for me to treat you today? Yes    Student Assistance N/A    Location Patient Served  [facility]    Visit Setting with Client Organization    Patient Status Unhoused    Insurance Medicaid   [place] Access   Insurance/Financial Assistance Referral N/A    Medication N/A    Medical Provider Yes   client was seen in July 2023, has not rescheduled follow up that was due in October   Screening Referrals Made N/A    Medical Referrals Made N/A    Medical Appointment Made N/A    Recently w/o PCP, now 1st time PCP visit completed due to CNs referral or appointment made N/A    Food Have Food Insecurities   69.00 in [employer] benefit/month   Transportation N/A   client walks to nearby places. Does have [employer] transportation   Housing/Utilities No permanent housing   Client is currenlty sleeping in a car in an impound lot   Interpersonal Safety N/A    Interventions Advocate/Support;Educate    Abnormal to Normal Screening Since Last CN Visit N/A    Screenings CN Performed Blood Pressure;Pulse Ox    Sent Client to Lab for: N/A    Did client attend any of the following based off CNs referral or appointments made? N/A   client has been getting lunch at a small church on [address] he is aware of the food at the [employer] and [employer] shelter   ED Visit Averted N/A    Life-Saving Intervention Made N/A               Dept: [phone]   Congregational Nurse Program Note  Date of Encounter: 07/02/2023 Client to [facility] with  request for foot care. Athlete's foot appears to be improving. Foot powder applied. Edema to lower legs improved, no pitting today. Support given. No other needs at this time. [name]  BSN, RN Past Medical History: Past Medical History:  Diagnosis Date   EtOH dependence (HCC)    Hep C w/o coma, chronic (HCC)    Hypertension    Liver cirrhosis, alcoholic (HCC)    Polysubstance abuse (HCC)    Pulmonary embolism (HCC)     Encounter Details:  CNP Questionnaire - 07/02/23 1232       Questionnaire   Ask client: Do you give verbal consent for me to treat you today? Yes    Student Assistance N/A    Location Patient Served  [facility]    Visit Setting with Client Organization    Patient Status Unhoused    Insurance Medicaid   [place] Access   Insurance/Financial Assistance Referral N/A    Medication N/A    Medical Provider Yes   client was seen in July 2023, has not rescheduled follow up that was due in October   Screening Referrals Made N/A    Medical Referrals Made N/A    Medical Appointment Made N/A    Recently w/o  PCP, now 1st time PCP visit completed due to CNs referral or appointment made N/A    Food Have Food Insecurities   69.00 in [employer] benefit/month   Transportation N/A   client walks to nearby places. Does have [employer] transportation   Housing/Utilities No permanent housing   Client is currenlty sleeping in a car in an impound lot   Interpersonal Safety N/A    Interventions Advocate/Support;Educate    Abnormal to Normal Screening Since Last CN Visit N/A    Screenings CN Performed Blood Pressure;Pulse Ox    Sent Client to Lab for: N/A    Did client attend any of the following based off CNs referral or appointments made? N/A   client has been getting lunch at a small church on [address] he is aware of the food at the [employer] and [employer] shelter   ED Visit Averted N/A    Life-Saving Intervention Made N/A

## 2023-07-05 ENCOUNTER — Emergency Department: Payer: MEDICAID

## 2023-07-05 ENCOUNTER — Other Ambulatory Visit: Payer: Self-pay

## 2023-07-05 DIAGNOSIS — R079 Chest pain, unspecified: Secondary | ICD-10-CM | POA: Diagnosis present

## 2023-07-05 DIAGNOSIS — I10 Essential (primary) hypertension: Secondary | ICD-10-CM | POA: Insufficient documentation

## 2023-07-05 DIAGNOSIS — Y906 Blood alcohol level of 120-199 mg/100 ml: Secondary | ICD-10-CM | POA: Insufficient documentation

## 2023-07-05 DIAGNOSIS — F10920 Alcohol use, unspecified with intoxication, uncomplicated: Secondary | ICD-10-CM | POA: Diagnosis not present

## 2023-07-05 LAB — CBC
HCT: 40.9 % (ref 39.0–52.0)
Hemoglobin: 13.6 g/dL (ref 13.0–17.0)
MCH: 33.1 pg (ref 26.0–34.0)
MCHC: 33.3 g/dL (ref 30.0–36.0)
MCV: 99.5 fL (ref 80.0–100.0)
Platelets: 130 10*3/uL — ABNORMAL LOW (ref 150–400)
RBC: 4.11 MIL/uL — ABNORMAL LOW (ref 4.22–5.81)
RDW: 12.3 % (ref 11.5–15.5)
WBC: 7.3 10*3/uL (ref 4.0–10.5)
nRBC: 0 % (ref 0.0–0.2)

## 2023-07-05 LAB — BASIC METABOLIC PANEL
Anion gap: 9 (ref 5–15)
BUN: 5 mg/dL — ABNORMAL LOW (ref 6–20)
CO2: 24 mmol/L (ref 22–32)
Calcium: 9 mg/dL (ref 8.9–10.3)
Chloride: 101 mmol/L (ref 98–111)
Creatinine, Ser: 0.65 mg/dL (ref 0.61–1.24)
GFR, Estimated: >60 mL/min (ref >60)
Glucose, Bld: 106 mg/dL — ABNORMAL HIGH (ref 70–99)
Potassium: 3.3 mmol/L — ABNORMAL LOW (ref 3.5–5.1)
Sodium: 134 mmol/L — ABNORMAL LOW (ref 135–145)

## 2023-07-05 LAB — TROPONIN I (HIGH SENSITIVITY): Troponin I (High Sensitivity): 9 ng/L (ref <18)

## 2023-07-05 LAB — ETHANOL: Alcohol, Ethyl (B): 192 mg/dL — ABNORMAL HIGH (ref <10)

## 2023-07-05 NOTE — ED Triage Notes (Signed)
Pt presents to ER from from home via EMS with complaints of chest pain. Per EMS pt reported dizziness, nausea. Pt is intoxicated reports drinks every day.  EMS administered 324mg  asa, and 0.4mg  nitroglycerin. Pt talks in completed sentences no respiratory distress noted

## 2023-07-06 ENCOUNTER — Emergency Department
Admission: EM | Admit: 2023-07-06 | Discharge: 2023-07-06 | Disposition: A | Discharge status: Home / Self Care | Payer: MEDICAID | Attending: Emergency Medicine | Admitting: Emergency Medicine

## 2023-07-06 DIAGNOSIS — F10920 Alcohol use, unspecified with intoxication, uncomplicated: Secondary | ICD-10-CM

## 2023-07-06 DIAGNOSIS — R079 Chest pain, unspecified: Secondary | ICD-10-CM

## 2023-07-06 LAB — HEPATIC FUNCTION PANEL
ALT: 67 U/L — ABNORMAL HIGH (ref 0–44)
AST: 148 U/L — ABNORMAL HIGH (ref 15–41)
Albumin: 3.3 g/dL — ABNORMAL LOW (ref 3.5–5.0)
Alkaline Phosphatase: 67 U/L (ref 38–126)
Bilirubin, Direct: 0.2 mg/dL (ref 0.0–0.2)
Indirect Bilirubin: 0.8 mg/dL (ref 0.3–0.9)
Total Bilirubin: 1 mg/dL (ref 0.3–1.2)
Total Protein: 8 g/dL (ref 6.5–8.1)

## 2023-07-06 LAB — TROPONIN I (HIGH SENSITIVITY): Troponin I (High Sensitivity): 10 ng/L (ref <18)

## 2023-07-06 LAB — LIPASE, BLOOD: Lipase: 43 U/L (ref 11–51)

## 2023-07-06 MED ORDER — ONDANSETRON 4 MG PO TBDP
4.0000 mg | ORAL_TABLET | Freq: Once | ORAL | Status: AC
  Administered 2023-07-06: 4 mg via ORAL
  Filled 2023-07-06: qty 1

## 2023-07-06 NOTE — ED Notes (Signed)
Ambulating independently in hallway.

## 2023-07-06 NOTE — ED Provider Notes (Signed)
[facility] Provider Note    Event Date/Time   First MD Initiated Contact with Patient 07/06/23 [phone]     (approximate)   History   Chest Pain   HPI  Jose [name] is a 62 y.o. male   Past medical history of alcohol use, liver cirrhosis, polysubstance use, hypertension, PE who presents to the emergency department with multiple complaints.  He drank alcohol today and has pain all over his body, scaling to the bottom of his feet, and nausea.  He denies any respiratory infectious complaints.  He is requesting resources for alcohol detox.  He last drank last night.  He denies drug use.   External Medical Documents Reviewed: Previous emergency department visits in September August for alcohol use and atypical chest pain with negative workups.      Physical Exam   Triage Vital Signs: ED Triage Vitals  Encounter Vitals Group     BP 07/05/23 2258 109/79     Systolic BP Percentile --      Diastolic BP Percentile --      Pulse Rate 07/05/23 2258 (!) 112     Resp 07/05/23 2258 16     Temp 07/05/23 2258 97.7 F (36.5 C)     Temp Source 07/05/23 2258 Oral     SpO2 07/05/23 2258 92 %     Weight 07/05/23 2300 160 lb (72.6 kg)     Height 07/05/23 2300 5\' 8"  (1.727 m)     Head Circumference --      Peak Flow --      Pain Score 07/05/23 2259 9     Pain Loc --      Pain Education --      Exclude from Growth Chart --     Most recent vital signs: Vitals:   07/05/23 2258  BP: 109/79  Pulse: (!) 112  Resp: 16  Temp: 97.7 F (36.5 C)  SpO2: 92%    General: Awake, no distress.  CV:  Good peripheral perfusion.  Resp:  Normal effort.  Abd:  No distention.  Other:  Awake alert comfortable appearing in no acute distress resting in stretcher.  Soft nontender abdomen, clear lung sounds, no hypoxemia.   ED Results / Procedures / Treatments   Labs (all labs ordered are listed, but only abnormal results are displayed) Labs Reviewed  BASIC  METABOLIC PANEL - Abnormal; Notable for the following components:      Result Value   Sodium 134 (*)    Potassium 3.3 (*)    Glucose, Bld 106 (*)    BUN 5 (*)    All other components within normal limits  CBC - Abnormal; Notable for the following components:   RBC 4.11 (*)    Platelets 130 (*)    All other components within normal limits  ETHANOL - Abnormal; Notable for the following components:   Alcohol, Ethyl (B) 192 (*)    All other components within normal limits  HEPATIC FUNCTION PANEL - Abnormal; Notable for the following components:   Albumin 3.3 (*)    AST 148 (*)    ALT 67 (*)    All other components within normal limits  LIPASE, BLOOD  TROPONIN I (HIGH SENSITIVITY)  TROPONIN I (HIGH SENSITIVITY)     I ordered and reviewed the above labs they are notable for alcohol level is 192 and serial troponins have been flat.  LFTs and lipase unremarkable, consistent with prior and reflective of alcohol liver  EKG  ED ECG REPORT I, Pilar Jarvis, the attending physician, personally viewed and interpreted this ECG.   Date: 07/06/2023  EKG Time: 2301  Rate: 107  Rhythm: sinus tachycardia  Axis: nl  Intervals:RBBB  ST&T Change: no stemi    RADIOLOGY I independently reviewed and interpreted chest x-ray and I see no obvious focality or pneumothorax I also reviewed radiologist's formal read.   PROCEDURES:  Critical Care performed: No  Procedures   MEDICATIONS ORDERED IN ED: Medications  ondansetron (ZOFRAN-ODT) disintegrating tablet 4 mg (4 mg Oral Given 07/06/23 0420)    IMPRESSION / MDM / ASSESSMENT AND PLAN / ED COURSE  I reviewed the triage vital signs and the nursing notes.                                Patient's presentation is most consistent with acute presentation with potential threat to life or bodily function.  Differential diagnosis includes, but is not limited to, alcohol intoxication, ACS, PE, dissection, intra-abdominal infection,  cholecystitis, pancreatitis   The patient is on the cardiac monitor to evaluate for evidence of arrhythmia and/or significant heart rate changes.  MDM:    Patient with nondescript symptoms pain all over and chronic nausea admits to drinking alcohol tonight.  Check for cardiopulmonary emergencies like ACS but nonischemic EKG serial troponins have been negative.  Lab testing largely unremarkable consistent with prior elevations of liver enzymes consistent with alcoholic liver pattern.  Soft nontender abdomen rules against surgical abdominal pathologies at this time.  Doubt PE given anticoagulation status.  Given unremarkable workup stable patient plan will be for discharge.       FINAL CLINICAL IMPRESSION(S) / ED DIAGNOSES   Final diagnoses:  Nonspecific chest pain  Alcoholic intoxication without complication [facility])     Rx / DC Orders   ED Discharge Orders          Ordered    Ambulatory Referral to Primary Care (Establish Care)        07/06/23 0451             Note:  This document was prepared using Dragon voice recognition software and may include unintentional dictation errors.    Pilar Jarvis, MD 07/06/23 [phone]

## 2023-07-06 NOTE — ED Notes (Signed)
Sandwich tray and drink provided per pt request.

## 2023-07-06 NOTE — Discharge Instructions (Signed)
Thank you for choosing [place] for your health care today!  Please see your primary doctor this week for a follow up appointment.   If you have any new, worsening, or unexpected symptoms call your doctor right away or come back to the emergency department for reevaluation.  It was my pleasure to care for you today.   Daneil Dan Modesto Charon, MD

## 2023-07-09 NOTE — Congregational Nurse Program (Signed)
  Dept: [phone]   Congregational Nurse Program Note  Date of Encounter: 07/09/2023 Client to [facility] day center with request for foot care and blood pressure check. Athlete's foot has greatly improved. Foot powder applied. Client is showering regularly here at the center and is keeping his feet dry. New shoes have been given, but he is currently not wearing them. He does have clean socks after each shower. Client was recently seen in the [facility] ER for unspecified chest pain, he denied any chest pain at today's RN visit. He reports he maybe interested in seeking rehab, but was not inclined to discuss it at this time. RN will continue to encourage discussion on this topic at next visit. [name] BSN, RN Past Medical History: Past Medical History:  Diagnosis Date   EtOH dependence (HCC)    Hep C w/o coma, chronic (HCC)    Hypertension    Liver cirrhosis, alcoholic (HCC)    Polysubstance abuse (HCC)    Pulmonary embolism (HCC)     Encounter Details:

## 2023-07-18 NOTE — Congregational Nurse Program (Signed)
  Dept: [phone]   Congregational Nurse Program Note  Date of Encounter: 07/18/2023 Client to [facility] day center with request for blood pressure check. BP (!) 140/78 (BP Location: Left Arm, Patient Position: Sitting, Cuff Size: Normal)   Pulse 76   SpO2 95% . WNL for patient. He had just smoked a cigarette. Support given, no other needs at this time.  [name]  Past Medical History: Past Medical History:  Diagnosis Date   EtOH dependence (HCC)    Hep C w/o coma, chronic (HCC)    Hypertension    Liver cirrhosis, alcoholic (HCC)    Polysubstance abuse (HCC)    Pulmonary embolism [facility])     Encounter Details:  Community Questionnaire - 07/18/23 0835       Questionnaire   Ask client: Do you give verbal consent for me to treat you today? Yes    Student Assistance N/A    Location Patient Served  [facility]    Encounter Setting CN site    Population Status Unhoused    Insurance Medicaid   [place] Access   Insurance/Financial Assistance Referral N/A    Medication N/A    Medical Provider Yes   client was seen in July 2023, has not rescheduled follow up that was due in October   Screening Referrals Made N/A    Medical Referrals Made N/A    Medical Appointment Completed N/A    CNP Interventions Advocate/Support;Educate    Screenings CN Performed Blood Pressure    ED Visit Averted N/A    Life-Saving Intervention Made N/A      Questionnaire   Housing/Utilities No permanent housing   Client is currenlty sleeping in a car in an impound lot

## 2023-07-24 NOTE — Congregational Nurse Program (Signed)
  Dept: [phone]   Congregational Nurse Program Note  Date of Encounter: 07/24/2023 Client to [facility] day center with request for foot care and blood pressure check. BP 128/72 (BP Location: Left Arm, Patient Position: Sitting, Cuff Size: Normal)   Pulse 94   SpO2 92% . Foot care provided. Client has been able to keep his feet clean and dry, some peeling noted to soles, much improved. No other needs at this time. [name]  Past Medical History: Past Medical History:  Diagnosis Date   EtOH dependence (HCC)    Hep C w/o coma, chronic (HCC)    Hypertension    Liver cirrhosis, alcoholic (HCC)    Polysubstance abuse (HCC)    Pulmonary embolism [facility])     Encounter Details:  Community Questionnaire - 07/24/23 1224       Questionnaire   Ask client: Do you give verbal consent for me to treat you today? Yes    Student Assistance N/A    Location Patient Served  [facility]    Encounter Setting CN site    Population Status Unhoused    Insurance Medicaid   [place] Access   Insurance/Financial Assistance Referral N/A    Medication N/A    Medical Provider Yes   client was seen in July 2023, has not rescheduled follow up that was due in October   Screening Referrals Made N/A    Medical Referrals Made N/A    Medical Appointment Completed N/A    CNP Interventions Advocate/Support;Educate    Screenings CN Performed Blood Pressure    ED Visit Averted N/A    Life-Saving Intervention Made N/A      Questionnaire   Housing/Utilities No permanent housing   Client is currenlty sleeping in a car in an impound lot

## 2023-07-25 NOTE — Congregational Nurse Program (Signed)
  Dept: [phone]   Congregational Nurse Program Note  Date of Encounter: 07/25/2023 Client to [facility] day center for emotional support and blood pressure check. BP (!) 140/78 (BP Location: Left Arm, Patient Position: Sitting, Cuff Size: Normal) . He reports being in a "foul mood" today. Has not had his alcohol as of this visit time. Non pitting edema noted to both ankles and mid calf. Client also has an abrasion to his right forehead. He declined any dressing. Antibiotic ointment applied.  He did stay for lunch and was able to take a shower. Assisted client with phone calls. No other needs at this time. [name]  Past Medical History: Past Medical History:  Diagnosis Date   EtOH dependence (HCC)    Hep C w/o coma, chronic (HCC)    Hypertension    Liver cirrhosis, alcoholic (HCC)    Polysubstance abuse (HCC)    Pulmonary embolism [facility])     Encounter Details:  Community Questionnaire - 07/25/23 1100       Questionnaire   Ask client: Do you give verbal consent for me to treat you today? Yes    Student Assistance N/A    Location Patient Served  [facility]    Encounter Setting CN site    Population Status Unhoused    Insurance Medicaid   [place] Access   Insurance/Financial Assistance Referral N/A    Medication N/A    Medical Provider Yes   client was seen in July 2023, has not rescheduled follow up that was due in October   Screening Referrals Made N/A    Medical Referrals Made N/A    Medical Appointment Completed N/A    CNP Interventions Advocate/Support;Educate    Screenings CN Performed Blood Pressure    ED Visit Averted N/A    Life-Saving Intervention Made N/A      Questionnaire   Housing/Utilities No permanent housing   Client is currenlty sleeping in a car in an impound lot

## 2023-08-01 NOTE — Congregational Nurse Program (Signed)
  Dept: [phone]   Congregational Nurse Program Note  Date of Encounter: 08/01/2023 Client to [facility] day center with request for foot care which was provided. No other needs at this time. [name] BSN, RN Past Medical History: Past Medical History:  Diagnosis Date   EtOH dependence (HCC)    Hep C w/o coma, chronic (HCC)    Hypertension    Liver cirrhosis, alcoholic (HCC)    Polysubstance abuse (HCC)    Pulmonary embolism [facility])     Encounter Details:  Community Questionnaire - 08/01/23 1259       Questionnaire   Ask client: Do you give verbal consent for me to treat you today? Yes    Student Assistance N/A    Location Patient Served  [facility]    Encounter Setting CN site    Population Status Unhoused    Insurance Medicaid   [place] Access   Insurance/Financial Assistance Referral N/A    Medication N/A    Medical Provider Yes   client was seen in July 2023, has not rescheduled follow up that was due in October   Screening Referrals Made N/A    Medical Referrals Made N/A    Medical Appointment Completed N/A    CNP Interventions Advocate/Support;Educate    Screenings CN Performed N/A    ED Visit Averted N/A    Life-Saving Intervention Made N/A      Questionnaire   Housing/Utilities No permanent housing   Client is currenlty sleeping in a car in an impound lot

## 2023-08-27 NOTE — Congregational Nurse Program (Signed)
  Dept: [phone]   Congregational Nurse Program Note  Date of Encounter: 08/27/2023 Client to [facility] day center with request for foot care. Nails trimmed. Non-adhesive dressing placed to small open area to the bottom of his left foot. No other needs at this time. Support given. Jose [name] BSN, RN Past Medical History: Past Medical History:  Diagnosis Date   EtOH dependence (HCC)    Hep C w/o coma, chronic (HCC)    Hypertension    Liver cirrhosis, alcoholic (HCC)    Polysubstance abuse (HCC)    Pulmonary embolism [facility])     Encounter Details:  Community Questionnaire - 08/27/23 1032       Questionnaire   Ask client: Do you give verbal consent for me to treat you today? Yes    Student Assistance N/A    Location Patient Served  [facility]    Encounter Setting CN site    Population Status Unhoused    Insurance Medicaid   [place] Access   Insurance/Financial Assistance Referral N/A    Medication N/A    Medical Provider Yes   client was seen in July 2023, has not rescheduled follow up that was due in October   Screening Referrals Made N/A    Medical Referrals Made N/A    Medical Appointment Completed N/A    CNP Interventions Advocate/Support;Educate    Screenings CN Performed N/A    ED Visit Averted N/A    Life-Saving Intervention Made N/A

## 2023-08-28 NOTE — Congregational Nurse Program (Signed)
  Dept: [phone]   Congregational Nurse Program Note  Date of Encounter: 08/28/2023 Client to [facility] day center for foot care. Foot care provided. No other needs at this time.[name] BSN, RN Past Medical History: Past Medical History:  Diagnosis Date   EtOH dependence (HCC)    Hep C w/o coma, chronic (HCC)    Hypertension    Liver cirrhosis, alcoholic (HCC)    Polysubstance abuse (HCC)    Pulmonary embolism [facility])     Encounter Details:  Community Questionnaire - 08/28/23 1135       Questionnaire   Ask client: Do you give verbal consent for me to treat you today? Yes    Student Assistance N/A    Location Patient Served  [facility]    Encounter Setting CN site    Population Status Unhoused    Insurance Medicaid   [place] Access   Insurance/Financial Assistance Referral N/A    Medication N/A    Medical Provider Yes   client was seen in July 2023, has not rescheduled follow up that was due in October   Screening Referrals Made N/A    Medical Referrals Made N/A    Medical Appointment Completed N/A    CNP Interventions Advocate/Support;Educate    Screenings CN Performed N/A    ED Visit Averted N/A    Life-Saving Intervention Made N/A

## 2023-09-04 NOTE — Congregational Nurse Program (Signed)
  Dept: [phone]   Congregational Nurse Program Note  Date of Encounter: 09/04/2023 Client to [facility] day center for vital sign check and support. BP 132/76 (BP Location: Left Arm, Patient Position: Sitting, Cuff Size: Normal)   Pulse 90   SpO2 98%. Support given, no other needs at this time. Jose [name]  Past Medical History: Past Medical History:  Diagnosis Date   EtOH dependence (HCC)    Hep C w/o coma, chronic (HCC)    Hypertension    Liver cirrhosis, alcoholic (HCC)    Polysubstance abuse (HCC)    Pulmonary embolism (HCC)     Encounter Details:

## 2023-09-10 NOTE — Congregational Nurse Program (Signed)
  Dept: [phone]   Congregational Nurse Program Note  Date of Encounter: 09/10/2023 Client to [facility] day center for emotional support and wound care. Client has a dime sized open area to his left shin. Wound clean with no drainage. Nonadherent dressing applied. Jose [name] Past Medical History: Past Medical History:  Diagnosis Date   EtOH dependence (HCC)    Hep C w/o coma, chronic (HCC)    Hypertension    Liver cirrhosis, alcoholic (HCC)    Polysubstance abuse (HCC)    Pulmonary embolism [facility])     Encounter Details:  Community Questionnaire - 09/10/23 1328       Questionnaire   Ask client: Do you give verbal consent for me to treat you today? Yes    Student Assistance N/A    Location Patient Served  [facility]    Encounter Setting CN site    Population Status Unhoused    Insurance Medicaid   [place] Access   Insurance/Financial Assistance Referral N/A    Medication N/A    Medical Provider Yes   client was seen in July 2023, has not rescheduled follow up that was due in October   Screening Referrals Made N/A    Medical Referrals Made N/A    Medical Appointment Completed N/A    CNP Interventions Advocate/Support;Educate    Screenings CN Performed N/A    ED Visit Averted N/A    Life-Saving Intervention Made N/A

## 2023-10-01 NOTE — Congregational Nurse Program (Signed)
  Dept: [phone]   Congregational Nurse Program Note  Date of Encounter: 10/01/2023 Client to [facility] day center nurse led clinic for foot care and vital sign check. BP 132/80 (BP Location: Left Arm, Patient Position: Sitting, Cuff Size: Normal)   Pulse 78   SpO2 100% . Foot care provided, toe nails trimmed. Emotional support given. Jose [name]  Past Medical History: Past Medical History:  Diagnosis Date   EtOH dependence (HCC)    Hep C w/o coma, chronic (HCC)    Hypertension    Liver cirrhosis, alcoholic (HCC)    Polysubstance abuse (HCC)    Pulmonary embolism (HCC)     Encounter Details:  Community Questionnaire - 10/01/23 1200       Questionnaire   Ask client: Do you give verbal consent for me to treat you today? Yes    Student Assistance N/A    Location Patient Served  [facility]    Encounter Setting CN site    Population Status Unhoused    Insurance Medicaid   [place] Access   Insurance/Financial Assistance Referral N/A    Medication N/A    Medical Provider Yes   client was seen in July 2023, has not rescheduled follow up that was due in October   Screening Referrals Made N/A    Medical Referrals Made N/A    Medical Appointment Completed N/A    CNP Interventions Advocate/Support;Educate    Screenings CN Performed Blood Pressure    ED Visit Averted N/A    Life-Saving Intervention Made N/A

## 2023-10-14 ENCOUNTER — Emergency Department: Payer: MEDICAID

## 2023-10-14 ENCOUNTER — Emergency Department
Admission: EM | Admit: 2023-10-14 | Discharge: 2023-10-14 | Disposition: A | Discharge status: Home / Self Care | Payer: MEDICAID | Attending: Emergency Medicine | Admitting: Emergency Medicine

## 2023-10-14 ENCOUNTER — Other Ambulatory Visit: Payer: Self-pay

## 2023-10-14 DIAGNOSIS — Z20822 Contact with and (suspected) exposure to covid-19: Secondary | ICD-10-CM | POA: Diagnosis not present

## 2023-10-14 DIAGNOSIS — R319 Hematuria, unspecified: Secondary | ICD-10-CM | POA: Insufficient documentation

## 2023-10-14 DIAGNOSIS — R197 Diarrhea, unspecified: Secondary | ICD-10-CM | POA: Diagnosis not present

## 2023-10-14 DIAGNOSIS — R112 Nausea with vomiting, unspecified: Secondary | ICD-10-CM | POA: Insufficient documentation

## 2023-10-14 DIAGNOSIS — R058 Other specified cough: Secondary | ICD-10-CM | POA: Diagnosis present

## 2023-10-14 DIAGNOSIS — M545 Low back pain, unspecified: Secondary | ICD-10-CM | POA: Diagnosis not present

## 2023-10-14 DIAGNOSIS — J069 Acute upper respiratory infection, unspecified: Secondary | ICD-10-CM | POA: Insufficient documentation

## 2023-10-14 LAB — TROPONIN I (HIGH SENSITIVITY)
Troponin I (High Sensitivity): 14 ng/L (ref <18)
Troponin I (High Sensitivity): 16 ng/L (ref <18)

## 2023-10-14 LAB — CBC WITH DIFFERENTIAL/PLATELET
Abs Immature Granulocytes: 0.02 10*3/uL (ref 0.00–0.07)
Basophils Absolute: 0 10*3/uL (ref 0.0–0.1)
Basophils Relative: 0 %
Eosinophils Absolute: 0 10*3/uL (ref 0.0–0.5)
Eosinophils Relative: 0 %
HCT: 38.2 % — ABNORMAL LOW (ref 39.0–52.0)
Hemoglobin: 13.3 g/dL (ref 13.0–17.0)
Immature Granulocytes: 0 %
Lymphocytes Relative: 15 %
Lymphs Abs: 0.9 10*3/uL (ref 0.7–4.0)
MCH: 33.2 pg (ref 26.0–34.0)
MCHC: 34.8 g/dL (ref 30.0–36.0)
MCV: 95.3 fL (ref 80.0–100.0)
Monocytes Absolute: 1.1 10*3/uL — ABNORMAL HIGH (ref 0.1–1.0)
Monocytes Relative: 18 %
Neutro Abs: 4 10*3/uL (ref 1.7–7.7)
Neutrophils Relative %: 67 %
Platelets: 86 10*3/uL — ABNORMAL LOW (ref 150–400)
RBC: 4.01 MIL/uL — ABNORMAL LOW (ref 4.22–5.81)
RDW: 13.1 % (ref 11.5–15.5)
Smear Review: DECREASED
WBC: 6.1 10*3/uL (ref 4.0–10.5)
nRBC: 0 % (ref 0.0–0.2)

## 2023-10-14 LAB — URINALYSIS, ROUTINE W REFLEX MICROSCOPIC
Bilirubin Urine: NEGATIVE
Glucose, UA: NEGATIVE mg/dL
Hgb urine dipstick: NEGATIVE
Ketones, ur: NEGATIVE mg/dL
Leukocytes,Ua: NEGATIVE
Nitrite: NEGATIVE
Protein, ur: NEGATIVE mg/dL
Specific Gravity, Urine: 1.018 (ref 1.005–1.030)
pH: 7 (ref 5.0–8.0)

## 2023-10-14 LAB — COMPREHENSIVE METABOLIC PANEL
ALT: 72 U/L — ABNORMAL HIGH (ref 0–44)
AST: 146 U/L — ABNORMAL HIGH (ref 15–41)
Albumin: 3.4 g/dL — ABNORMAL LOW (ref 3.5–5.0)
Alkaline Phosphatase: 65 U/L (ref 38–126)
Anion gap: 12 (ref 5–15)
BUN: 10 mg/dL (ref 8–23)
CO2: 22 mmol/L (ref 22–32)
Calcium: 9.1 mg/dL (ref 8.9–10.3)
Chloride: 96 mmol/L — ABNORMAL LOW (ref 98–111)
Creatinine, Ser: 0.65 mg/dL (ref 0.61–1.24)
GFR, Estimated: >60 mL/min (ref >60)
Glucose, Bld: 100 mg/dL — ABNORMAL HIGH (ref 70–99)
Potassium: 3.8 mmol/L (ref 3.5–5.1)
Sodium: 130 mmol/L — ABNORMAL LOW (ref 135–145)
Total Bilirubin: 1.3 mg/dL — ABNORMAL HIGH (ref 0.0–1.2)
Total Protein: 7.7 g/dL (ref 6.5–8.1)

## 2023-10-14 LAB — RESP PANEL BY RT-PCR (RSV, FLU A&B, COVID)  RVPGX2
Influenza A by PCR: NEGATIVE
Influenza B by PCR: NEGATIVE
Resp Syncytial Virus by PCR: NEGATIVE
SARS Coronavirus 2 by RT PCR: NEGATIVE

## 2023-10-14 MED ORDER — IPRATROPIUM-ALBUTEROL 0.5-2.5 (3) MG/3ML IN SOLN
3.0000 mL | Freq: Once | RESPIRATORY_TRACT | Status: AC
  Administered 2023-10-14: 3 mL via RESPIRATORY_TRACT
  Filled 2023-10-14: qty 3

## 2023-10-14 MED ORDER — IBUPROFEN 800 MG PO TABS
800.0000 mg | ORAL_TABLET | Freq: Three times a day (TID) | ORAL | 0 refills | Status: AC | PRN
  Filled 2023-10-15: qty 15, 5d supply, fill #0

## 2023-10-14 MED ORDER — ONDANSETRON 4 MG PO TBDP
4.0000 mg | ORAL_TABLET | Freq: Three times a day (TID) | ORAL | 0 refills | Status: AC | PRN
  Filled 2023-10-15: qty 9, 3d supply, fill #0

## 2023-10-14 MED ORDER — ALBUTEROL SULFATE HFA 108 (90 BASE) MCG/ACT IN AERS
2.0000 | INHALATION_SPRAY | Freq: Four times a day (QID) | RESPIRATORY_TRACT | 2 refills | Status: AC | PRN
Start: 2023-10-14 — End: ?
  Filled 2023-10-15: qty 18, 30d supply, fill #0
  Filled 2023-11-21: qty 18, 30d supply, fill #1

## 2023-10-14 MED ORDER — SODIUM CHLORIDE 0.9 % IV BOLUS
1000.0000 mL | Freq: Once | INTRAVENOUS | Status: AC
  Administered 2023-10-14: 1000 mL via INTRAVENOUS

## 2023-10-14 MED ORDER — METHYLPREDNISOLONE SODIUM SUCC 125 MG IJ SOLR
125.0000 mg | Freq: Two times a day (BID) | INTRAMUSCULAR | Status: DC
  Administered 2023-10-14: 125 mg via INTRAVENOUS
  Filled 2023-10-14: qty 2

## 2023-10-14 MED ORDER — IBUPROFEN 600 MG PO TABS
600.0000 mg | ORAL_TABLET | Freq: Once | ORAL | Status: AC
  Administered 2023-10-14: 600 mg via ORAL
  Filled 2023-10-14: qty 1

## 2023-10-14 MED ORDER — ACETAMINOPHEN 325 MG PO TABS: 650.0000 mg | ORAL_TABLET | Freq: Once | ORAL | Status: DC

## 2023-10-14 NOTE — Discharge Instructions (Addendum)
You have been diagnosed with viral upper respiratory infection, diarrhea vomit.  Please take Zofran 20 minutes before main meals.  Use albuterol every 6 hours for wheezing.  Take ibuprofen every 8 hours after main meals.

## 2023-10-14 NOTE — ED Triage Notes (Signed)
First nurse note: [facility], body aches x2 weeks. T 99.7.  18g L FA

## 2023-10-14 NOTE — ED Triage Notes (Signed)
Pt c/o cough, lower back pain, abdominal pain, and vomiting x2 weeks. Pt denies sick contacts. Pt reports some blood in urine also.

## 2023-10-14 NOTE — ED Provider Notes (Signed)
[facility] Provider Note    Event Date/Time   First MD Initiated Contact with Patient 10/14/23 Jose [name]     (approximate)   History   Cough and Abdominal Pain   HPI  Jose [name] is a 63 y.o. male who presents today with history of fever, chills, productive cough, shortness of breath, nauseas, vomit, diarrhea.  Patient states having lower back pain, and hematuria, history of kidney stones 35 years ago.  History of hepatitis C      Physical Exam   Triage Vital Signs: ED Triage Vitals  Encounter Vitals Group     BP 10/14/23 1429 134/83     Systolic BP Percentile --      Diastolic BP Percentile --      Pulse Rate 10/14/23 1429 (!) 113     Resp 10/14/23 1429 20     Temp 10/14/23 1429 (!) 100.4 F (38 C)     Temp src --      SpO2 10/14/23 1429 96 %     Weight 10/14/23 1430 145 lb (65.8 kg)     Height 10/14/23 1430 5\' 8"  (1.727 m)     Head Circumference --      Peak Flow --      Pain Score 10/14/23 1430 Asleep     Pain Loc --      Pain Education --      Exclude from Growth Chart --     Most recent vital signs: Vitals:   10/14/23 1832 10/14/23 2159  BP: 124/72   Pulse: (!) 103   Resp: 19   Temp: (!) 101 F (38.3 C) 98.1 F (36.7 C)  SpO2: 97%      Constitutional: Alert, mild distress Eyes: Conjunctivae are normal.  Head: Atraumatic. Nose: No congestion/rhinnorhea. Mouth/Throat: Mucous membranes are moist.  No tonsillar exudate Neck: Painless ROM.  Cardiovascular:   Good peripheral circulation.  Regular rhythm Respiratory: Normal respiratory effort.  No retractions.  Bilateral inspiratory wheezing Gastrointestinal: Soft .  Distended tender to palpation Musculoskeletal:  no deformity Neurologic:  MAE spontaneously. No gross focal neurologic deficits are appreciated.  Skin:  Skin is warm, dry and intact. No rash noted. Psychiatric: Mood and affect are normal. Speech and behavior are normal.    ED Results / Procedures /  Treatments   Labs (all labs ordered are listed, but only abnormal results are displayed) Labs Reviewed  COMPREHENSIVE METABOLIC PANEL - Abnormal; Notable for the following components:      Result Value   Sodium 130 (*)    Chloride 96 (*)    Glucose, Bld 100 (*)    Albumin 3.4 (*)    AST 146 (*)    ALT 72 (*)    Total Bilirubin 1.3 (*)    All other components within normal limits  CBC WITH DIFFERENTIAL/PLATELET - Abnormal; Notable for the following components:   RBC 4.01 (*)    HCT 38.2 (*)    Platelets 86 (*)    Monocytes Absolute 1.1 (*)    All other components within normal limits  URINALYSIS, ROUTINE W REFLEX MICROSCOPIC - Abnormal; Notable for the following components:   Color, Urine YELLOW (*)    APPearance CLEAR (*)    All other components within normal limits  RESP PANEL BY RT-PCR (RSV, FLU A&B, COVID)  [id number]  TROPONIN I (HIGH SENSITIVITY)  TROPONIN I (HIGH SENSITIVITY)     EKG See physician read    RADIOLOGY I independently reviewed and  interpreted imaging and agree with radiologists findings.      PROCEDURES:  Critical Care performed:   Procedures   MEDICATIONS ORDERED IN ED: Medications  methylPREDNISolone sodium succinate (SOLU-MEDROL) 125 mg/2 mL injection 125 mg (125 mg Intravenous Given 10/14/23 1953)  ibuprofen (ADVIL) tablet 600 mg (600 mg Oral Given 10/14/23 1839)  sodium chloride 0.9 % bolus 1,000 mL (0 mLs Intravenous Stopped 10/14/23 2202)  ipratropium-albuterol (DUONEB) 0.5-2.5 (3) MG/3ML nebulizer solution 3 mL (3 mLs Nebulization Given 10/14/23 1953)  ipratropium-albuterol (DUONEB) 0.5-2.5 (3) MG/3ML nebulizer solution 3 mL (3 mLs Nebulization Given 10/14/23 2205)     IMPRESSION / MDM / ASSESSMENT AND PLAN / ED COURSE  I reviewed the triage vital signs and the nursing notes.  Differential diagnosis includes, but is not limited to, influenza, COVID, pneumonia, asthma, nephrolithiasis, UTI  Patient's presentation is most consistent with  acute complicated illness / injury requiring diagnostic workup.   Patient's diagnosis is consistent with viral upper respiratory infection, wheezing, hepatitis C by history.  I independently reviewed and interpreted imaging and agree with radiologists findings. Labs are rea reassuring. I did review the patient's allergies and medications.  Patient received ibuprofen 600 mg, DuoNeb nebulizations x 2, Solu-Medrol IV, saline solution 1000 mL.  Reassess the patient on a physical exam no wheezing, patient is diaphoretic but no febrile. Patient will be discharged home with prescriptions for Zofran, albuterol, ibuprofen,  patient is to follow up with PCP as needed or otherwise directed. Patient is given ED precautions to return to the ED for any worsening or new symptoms. Discussed plan of care with patient, answered all of patient's questions, Patient agreeable to plan of care. Advised patient to take medications according to the instructions on the label. Discussed possible side effects of new medications. Patient verbalized understanding. Clinical Course as of 10/14/23 2214  Mon Oct 14, 2023  1926 DG Chest 2 View No acute cardiopulmonary findings [AE]  1937 Resp panel by RT-PCR (RSV, Flu A&B, Covid) Anterior Nasal Swab Negative [AE]  1937 Troponin I (High Sensitivity) Sodium 130, liver function test elevated, albumin decreased 3.4 [AE]  2055 MCV: 95.3 [AE]    Clinical Course User Index [AE] [name], PA-C     FINAL CLINICAL IMPRESSION(S) / ED DIAGNOSES   Final diagnoses:  Viral URI with cough  Nausea, vomiting and diarrhea     Rx / DC Orders   ED Discharge Orders          Ordered    ondansetron (ZOFRAN-ODT) 4 MG disintegrating tablet  Every 8 hours PRN        10/14/23 2212    ibuprofen (ADVIL) 800 MG tablet  Every 8 hours PRN        10/14/23 2212    albuterol (VENTOLIN HFA) 108 (90 Base) MCG/ACT inhaler  Every 6 hours PRN        10/14/23 2212             Note:  This  document was prepared using Dragon voice recognition software and may include unintentional dictation errors.   [name], PA-C 10/14/23 2258    Merwyn Katos, MD 10/15/23 2200

## 2023-10-14 NOTE — ED Provider Triage Note (Signed)
Emergency Medicine Provider Triage Evaluation Note  Jose [name] , a 63 y.o. male  was evaluated in triage.  Pt complains of cough, body aches, SOB, n/v x 2 weeks.  Review of Systems  Positive: cough, body aches, SOB, n/v Negative: Abd pain  Physical Exam  BP 134/83   Pulse (!) 113   Temp (!) 100.4 F (38 C)   Resp 20   SpO2 96%  Gen:   Awake, no distress   Resp:  Normal effort  MSK:   Moves extremities without difficulty  Other:    Medical Decision Making  Medically screening exam initiated at 2:30 PM.  Appropriate orders placed.  Davionte Laplante was informed that the remainder of the evaluation will be completed by another provider, this initial triage assessment does not replace that evaluation, and the importance of remaining in the ED until their evaluation is complete.     Jackelyn Hoehn, PA-C 10/14/23 1431

## 2023-10-14 NOTE — ED Notes (Signed)
Provided snack box and sodas

## 2023-10-15 ENCOUNTER — Other Ambulatory Visit: Payer: Self-pay

## 2023-10-16 NOTE — Congregational Nurse Program (Signed)
  Dept: [phone]   Congregational Nurse Program Note  Date of Encounter: 10/16/2023 Client to [facility] day center nurse led clinic for follow from [facility] ER visit on 1/20. Prescribed medications obtained from the [facility] community pharmacy. Prescriptions transferred there from [facility] as patient cannot afford his copay. RN provided education regarding his new medications. Client reports he is feeling better today. No other needs at this time. [name] BSN, RN Past Medical History: Past Medical History:  Diagnosis Date   EtOH dependence (HCC)    Hep C w/o coma, chronic (HCC)    Hypertension    Liver cirrhosis, alcoholic (HCC)    Polysubstance abuse (HCC)    Pulmonary embolism [facility])     Encounter Details:  Community Questionnaire - 10/16/23 1239       Questionnaire   Ask client: Do you give verbal consent for me to treat you today? Yes    Student Assistance N/A    Location Patient Served  [facility]    Encounter Setting CN site    Population Status Unhoused    Insurance Medicaid   [place] Access   Insurance/Financial Assistance Referral N/A    Medication Provided Medication Assistance    Medical Provider Yes   client was seen in July 2023, has not rescheduled follow up that was due in October   Screening Referrals Made N/A    Medical Referrals Made N/A    Medical Appointment Completed N/A    CNP Interventions Advocate/Support;Educate;Reviewed D/C Planning    Screenings CN Performed Blood Pressure    ED Visit Averted N/A    Life-Saving Intervention Made N/A

## 2023-10-22 NOTE — Congregational Nurse Program (Signed)
  Dept: [phone]   Congregational Nurse Program Note  Date of Encounter: 10/22/2023 Client to [facility] day center nurse led clinic with complaints of a cough and congestion. Client was seen last week in the [facility] ER and had a negative respiratory panel. He was given prescriptions for ibuprofen and an inhaler which where filled and given to client last week. He reports he has not smoked or drank alcohol since Saturday and has a poor appetite. RN encouraged him to drink juice, not coffee while at the center. He did eat some breakfast. He remained irritable through out the morning. He reports he had not taken any of the ibuprofen since Thursday. Rn again encouraged him to take the ibuprofen with food and to use the inhaler as needed. No shortness of breath noted nor reported. Loose productive cough and some nasal congestion present. Much emotional support and active listening provided. [name] BSN, RN Past Medical History: Past Medical History:  Diagnosis Date   EtOH dependence (HCC)    Hep C w/o coma, chronic (HCC)    Hypertension    Liver cirrhosis, alcoholic (HCC)    Polysubstance abuse (HCC)    Pulmonary embolism (HCC)     Encounter Details:  Community Questionnaire - 10/22/23 0900       Questionnaire   Ask client: Do you give verbal consent for me to treat you today? Yes    Student Assistance N/A    Location Patient Served  [facility]    Encounter Setting CN site    Population Status Unhoused    Insurance Medicaid   [place] Access   Insurance/Financial Assistance Referral N/A    Medication Provided Medication Assistance    Medical Provider Yes   client was seen in July 2023, has not rescheduled follow up that was due in October   Screening Referrals Made N/A    Medical Referrals Made N/A    Medical Appointment Completed N/A    CNP Interventions Advocate/Support;Educate    Screenings CN Performed N/A    ED Visit Averted N/A    Life-Saving Intervention Made N/A

## 2023-10-30 NOTE — Congregational Nurse Program (Signed)
  Dept: [phone]   Congregational Nurse Program Note  Date of Encounter: 10/30/2023 Client to [facility] day [facility] nurse led clinic with request for blood pressure and temperature check. BP 118/68 (BP Location: Left Arm, Patient Position: Sitting, Cuff Size: Normal)   Pulse 76   Temp 97.9 F (36.6 C) (Oral)   SpO2 95% . His cough has improved since last visit. He reports less head congestion as well. No other needs at this time. MARLA Casimir BETHANN OBIE  Past Medical History: Past Medical History:  Diagnosis Date   EtOH dependence (HCC)    Hep C w/o coma, chronic (HCC)    Hypertension    Liver cirrhosis, alcoholic (HCC)    Polysubstance abuse (HCC)    Pulmonary embolism [facility])     Encounter Details:  Community Questionnaire - 10/30/23 0930       Questionnaire   Ask client: Do you give verbal consent for me to treat you today? Yes    Student Assistance N/A    Location Patient Served  [facility]    Encounter Setting CN site    Population Status Unhoused    Insurance Medicaid   [place] Access   Insurance/Financial Assistance Referral N/A    Medication Provided Medication Assistance    Medical Provider Yes   client was seen in July 2023, has not rescheduled follow up that was due in October   Screening Referrals Made N/A    Medical Referrals Made N/A    Medical Appointment Completed N/A    CNP Interventions Advocate/Support;Educate    Screenings CN Performed Blood Pressure    ED Visit Averted N/A    Life-Saving Intervention Made N/A

## 2023-11-12 NOTE — Congregational Nurse Program (Signed)
  Dept: [phone]   Congregational Nurse Program Note  Date of Encounter: 11/12/2023 Client to [facility] day center, nurse led clinic for vital sign check and foot care. BP 124/68 (BP Location: Left Arm, Patient Position: Sitting, Cuff Size: Normal)   Pulse 84   SpO2 91% . Foot care provided. No open areas or sores noted.  Emotional support provided. [name] BSN, RN  Past Medical History: Past Medical History:  Diagnosis Date   EtOH dependence (HCC)    Hep C w/o coma, chronic (HCC)    Hypertension    Liver cirrhosis, alcoholic (HCC)    Polysubstance abuse (HCC)    Pulmonary embolism [facility])     Encounter Details:  Community Questionnaire - 11/12/23 1301       Questionnaire   Ask client: Do you give verbal consent for me to treat you today? Yes    Student Assistance N/A    Location Patient Served  [facility]    Encounter Setting CN site    Population Status Unhoused    Insurance Medicaid   [place] Access   Insurance/Financial Assistance Referral N/A    Medication Provided Medication Assistance    Medical Provider Yes   client was seen in July 2023, has not rescheduled follow up that was due in October   Screening Referrals Made N/A    Medical Referrals Made N/A    Medical Appointment Completed N/A    CNP Interventions Advocate/Support;Educate    Screenings CN Performed Blood Pressure    ED Visit Averted N/A    Life-Saving Intervention Made N/A

## 2023-11-19 ENCOUNTER — Emergency Department
Admission: EM | Admit: 2023-11-19 | Discharge: 2023-11-20 | Disposition: A | Discharge status: Home / Self Care | Payer: MEDICAID | Attending: Emergency Medicine | Admitting: Emergency Medicine

## 2023-11-19 DIAGNOSIS — R1030 Lower abdominal pain, unspecified: Secondary | ICD-10-CM | POA: Diagnosis not present

## 2023-11-19 DIAGNOSIS — G8929 Other chronic pain: Secondary | ICD-10-CM | POA: Insufficient documentation

## 2023-11-19 DIAGNOSIS — R0602 Shortness of breath: Secondary | ICD-10-CM | POA: Diagnosis present

## 2023-11-19 DIAGNOSIS — M542 Cervicalgia: Secondary | ICD-10-CM | POA: Diagnosis not present

## 2023-11-19 DIAGNOSIS — Z86711 Personal history of pulmonary embolism: Secondary | ICD-10-CM | POA: Diagnosis not present

## 2023-11-19 DIAGNOSIS — I10 Essential (primary) hypertension: Secondary | ICD-10-CM | POA: Diagnosis not present

## 2023-11-19 DIAGNOSIS — Y907 Blood alcohol level of 200-239 mg/100 ml: Secondary | ICD-10-CM | POA: Diagnosis not present

## 2023-11-19 DIAGNOSIS — R300 Dysuria: Secondary | ICD-10-CM | POA: Diagnosis not present

## 2023-11-19 DIAGNOSIS — F10920 Alcohol use, unspecified with intoxication, uncomplicated: Secondary | ICD-10-CM | POA: Diagnosis not present

## 2023-11-19 DIAGNOSIS — R079 Chest pain, unspecified: Secondary | ICD-10-CM

## 2023-11-19 DIAGNOSIS — J181 Lobar pneumonia, unspecified organism: Secondary | ICD-10-CM | POA: Insufficient documentation

## 2023-11-19 DIAGNOSIS — Z7901 Long term (current) use of anticoagulants: Secondary | ICD-10-CM | POA: Insufficient documentation

## 2023-11-19 DIAGNOSIS — Z79899 Other long term (current) drug therapy: Secondary | ICD-10-CM | POA: Diagnosis not present

## 2023-11-19 DIAGNOSIS — R319 Hematuria, unspecified: Secondary | ICD-10-CM | POA: Insufficient documentation

## 2023-11-19 DIAGNOSIS — J189 Pneumonia, unspecified organism: Secondary | ICD-10-CM

## 2023-11-20 ENCOUNTER — Emergency Department: Payer: MEDICAID

## 2023-11-20 ENCOUNTER — Other Ambulatory Visit: Payer: Self-pay

## 2023-11-20 LAB — COMPREHENSIVE METABOLIC PANEL
ALT: 130 U/L — ABNORMAL HIGH (ref 0–44)
AST: 242 U/L — ABNORMAL HIGH (ref 15–41)
Albumin: 3.4 g/dL — ABNORMAL LOW (ref 3.5–5.0)
Alkaline Phosphatase: 72 U/L (ref 38–126)
Anion gap: 10 (ref 5–15)
BUN: 10 mg/dL (ref 8–23)
CO2: 24 mmol/L (ref 22–32)
Calcium: 9.7 mg/dL (ref 8.9–10.3)
Chloride: 106 mmol/L (ref 98–111)
Creatinine, Ser: 0.69 mg/dL (ref 0.61–1.24)
GFR, Estimated: >60 mL/min (ref >60)
Glucose, Bld: 171 mg/dL — ABNORMAL HIGH (ref 70–99)
Potassium: 3.6 mmol/L (ref 3.5–5.1)
Sodium: 140 mmol/L (ref 135–145)
Total Bilirubin: 0.8 mg/dL (ref 0.0–1.2)
Total Protein: 7.8 g/dL (ref 6.5–8.1)

## 2023-11-20 LAB — URINE DRUG SCREEN, QUALITATIVE (ARMC ONLY)
Amphetamines, Ur Screen: NOT DETECTED
Barbiturates, Ur Screen: NOT DETECTED
Benzodiazepine, Ur Scrn: NOT DETECTED
Cannabinoid 50 Ng, Ur ~~LOC~~: NOT DETECTED
Cocaine Metabolite,Ur ~~LOC~~: POSITIVE — AB
MDMA (Ecstasy)Ur Screen: NOT DETECTED
Methadone Scn, Ur: NOT DETECTED
Opiate, Ur Screen: NOT DETECTED
Phencyclidine (PCP) Ur S: NOT DETECTED
Tricyclic, Ur Screen: NOT DETECTED

## 2023-11-20 LAB — ETHANOL: Alcohol, Ethyl (B): 237 mg/dL — ABNORMAL HIGH (ref <10)

## 2023-11-20 LAB — RESP PANEL BY RT-PCR (RSV, FLU A&B, COVID)  RVPGX2
Influenza A by PCR: NEGATIVE
Influenza B by PCR: NEGATIVE
Resp Syncytial Virus by PCR: NEGATIVE
SARS Coronavirus 2 by RT PCR: NEGATIVE

## 2023-11-20 LAB — CBC WITH DIFFERENTIAL/PLATELET
Abs Immature Granulocytes: 0.03 10*3/uL (ref 0.00–0.07)
Basophils Absolute: 0 10*3/uL (ref 0.0–0.1)
Basophils Relative: 0 %
Eosinophils Absolute: 0.1 10*3/uL (ref 0.0–0.5)
Eosinophils Relative: 1 %
HCT: 38.9 % — ABNORMAL LOW (ref 39.0–52.0)
Hemoglobin: 12.9 g/dL — ABNORMAL LOW (ref 13.0–17.0)
Immature Granulocytes: 1 %
Lymphocytes Relative: 36 %
Lymphs Abs: 2.3 10*3/uL (ref 0.7–4.0)
MCH: 32.7 pg (ref 26.0–34.0)
MCHC: 33.2 g/dL (ref 30.0–36.0)
MCV: 98.7 fL (ref 80.0–100.0)
Monocytes Absolute: 0.8 10*3/uL (ref 0.1–1.0)
Monocytes Relative: 13 %
Neutro Abs: 3 10*3/uL (ref 1.7–7.7)
Neutrophils Relative %: 49 %
Platelets: 103 10*3/uL — ABNORMAL LOW (ref 150–400)
RBC: 3.94 MIL/uL — ABNORMAL LOW (ref 4.22–5.81)
RDW: 14 % (ref 11.5–15.5)
WBC: 6.2 10*3/uL (ref 4.0–10.5)
nRBC: 0 % (ref 0.0–0.2)

## 2023-11-20 LAB — URINALYSIS, ROUTINE W REFLEX MICROSCOPIC
Bacteria, UA: NONE SEEN
Bilirubin Urine: NEGATIVE
Glucose, UA: NEGATIVE mg/dL
Hgb urine dipstick: NEGATIVE
Ketones, ur: NEGATIVE mg/dL
Leukocytes,Ua: NEGATIVE
Nitrite: NEGATIVE
Protein, ur: NEGATIVE mg/dL
Specific Gravity, Urine: 1.015 (ref 1.005–1.030)
Squamous Epithelial / HPF: 0 /[HPF] (ref 0–5)
pH: 5 (ref 5.0–8.0)

## 2023-11-20 LAB — TROPONIN I (HIGH SENSITIVITY)
Troponin I (High Sensitivity): 13 ng/L (ref <18)
Troponin I (High Sensitivity): 12 ng/L (ref <18)

## 2023-11-20 LAB — LIPASE, BLOOD: Lipase: 58 U/L — ABNORMAL HIGH (ref 11–51)

## 2023-11-20 LAB — D-DIMER, QUANTITATIVE: D-Dimer, Quant: 0.76 ug{FEU}/mL — ABNORMAL HIGH (ref 0.00–0.50)

## 2023-11-20 MED ORDER — CEFDINIR 300 MG PO CAPS
300.0000 mg | ORAL_CAPSULE | Freq: Two times a day (BID) | ORAL | 0 refills | Status: DC
  Filled 2023-11-21: qty 14, 7d supply, fill #0

## 2023-11-20 MED ORDER — LORAZEPAM 2 MG PO TABS
0.0000 mg | ORAL_TABLET | Freq: Two times a day (BID) | ORAL | Status: DC
Start: 2023-11-22 — End: 2023-11-20

## 2023-11-20 MED ORDER — THIAMINE MONONITRATE 100 MG PO TABS: 100.0000 mg | ORAL_TABLET | Freq: Every day | ORAL | Status: DC

## 2023-11-20 MED ORDER — AZITHROMYCIN 250 MG PO TABS
ORAL_TABLET | ORAL | 0 refills | Status: AC
  Filled 2023-11-21: qty 6, 5d supply, fill #0

## 2023-11-20 MED ORDER — SODIUM CHLORIDE 0.9 % IV SOLN
2.0000 g | Freq: Once | INTRAVENOUS | Status: AC
  Administered 2023-11-20: 2 g via INTRAVENOUS
  Filled 2023-11-20: qty 20

## 2023-11-20 MED ORDER — SODIUM CHLORIDE 0.9 % IV BOLUS (SEPSIS)
1000.0000 mL | Freq: Once | INTRAVENOUS | Status: AC
Start: 2023-11-20 — End: 2023-11-20
  Administered 2023-11-20: 1000 mL via INTRAVENOUS

## 2023-11-20 MED ORDER — LORAZEPAM 2 MG/ML IJ SOLN: 0.0000 mg | Freq: Four times a day (QID) | INTRAMUSCULAR | Status: DC

## 2023-11-20 MED ORDER — ACETAMINOPHEN 500 MG PO TABS: 1000.0000 mg | ORAL_TABLET | Freq: Once | ORAL | Status: DC

## 2023-11-20 MED ORDER — IOHEXOL 350 MG/ML SOLN
75.0000 mL | Freq: Once | INTRAVENOUS | Status: AC | PRN
  Administered 2023-11-20: 75 mL via INTRAVENOUS

## 2023-11-20 MED ORDER — SODIUM CHLORIDE 0.9 % IV SOLN
500.0000 mg | Freq: Once | INTRAVENOUS | Status: AC
  Administered 2023-11-20: 500 mg via INTRAVENOUS
  Filled 2023-11-20: qty 5

## 2023-11-20 MED ORDER — OXYCODONE HCL 5 MG PO TABS
5.0000 mg | ORAL_TABLET | Freq: Once | ORAL | Status: AC
  Administered 2023-11-20: 5 mg via ORAL
  Filled 2023-11-20: qty 1

## 2023-11-20 MED ORDER — IOHEXOL 300 MG/ML  SOLN
100.0000 mL | Freq: Once | INTRAMUSCULAR | Status: AC | PRN
  Administered 2023-11-20: 100 mL via INTRAVENOUS

## 2023-11-20 MED ORDER — LORAZEPAM 2 MG PO TABS
0.0000 mg | ORAL_TABLET | Freq: Four times a day (QID) | ORAL | Status: DC
  Administered 2023-11-20: 1 mg via ORAL
  Filled 2023-11-20: qty 1

## 2023-11-20 MED ORDER — LORAZEPAM 2 MG/ML IJ SOLN: 0.0000 mg | Freq: Two times a day (BID) | INTRAMUSCULAR | Status: DC

## 2023-11-20 MED ORDER — KETOROLAC TROMETHAMINE 30 MG/ML IJ SOLN
30.0000 mg | Freq: Once | INTRAMUSCULAR | Status: AC
  Administered 2023-11-20: 30 mg via INTRAVENOUS
  Filled 2023-11-20: qty 1

## 2023-11-20 MED ORDER — IBUPROFEN 800 MG PO TABS: 800.0000 mg | ORAL_TABLET | Freq: Three times a day (TID) | ORAL | 0 refills | Status: DC | PRN

## 2023-11-20 MED ORDER — THIAMINE HCL 100 MG/ML IJ SOLN: 100.0000 mg | Freq: Every day | INTRAMUSCULAR | Status: DC

## 2023-11-20 NOTE — Discharge Instructions (Addendum)
 Your CT scan showed left lower lobe pneumonia.  No sign of heart attack or blood clot in your lungs.  CT of your abdomen was normal today.  Your urine showed no sign of infection.  Please take your antibiotics until complete.  Radiology recommends repeat imaging of your chest in 3 months to ensure resolution of the pneumonia seen today.   Please go to the following website to schedule new (and existing) patient appointments:   [email or web address]   The following is a list of primary care offices in the area who are accepting new patients at this time.  Please reach out to one of them directly and let them know you would like to schedule an appointment to follow up on an Emergency Department visit, and/or to establish a new primary care provider (PCP).  There are likely other primary care clinics in the are who are accepting new patients, but this is an excellent place to start:  [facility] Lead physician: Dr Shirlee Latch [address] [place], [place] [address] [phone]  [facility] Lead Physician: Dr Alba Cory [address], [place], [place] [address] [phone]  [facility]  Lead Physician: Dr Olevia Perches [address] [place], [place] [address] [phone]  [facility] Lead Physician: Dr Sofie Hartigan [address], [place], [place] [address] [phone]  [facility] Primary Care & Sports Medicine at [facility] Lead Physician: Dr Bari Edward [address] [place], [place], [place] [address] [phone]

## 2023-11-20 NOTE — ED Notes (Signed)
 Pt moved to 13H at this time. RN notified.

## 2023-11-20 NOTE — ED Notes (Signed)
 Provided with sandwich bag and sprite.

## 2023-11-20 NOTE — ED Notes (Signed)
 Rocephin given prior to blood culture drawn. MD did not notify RN that blood culture needed to be drawn

## 2023-11-20 NOTE — ED Notes (Signed)
 Resumed care from [employer].  Meds given per ciwa protocol.  Pt in hallway bed .  Pt calm and cooperative.  Siderails up x 2.

## 2023-11-20 NOTE — ED Provider Notes (Signed)
 [facility] Provider Note    Event Date/Time   First MD Initiated Contact with Patient 11/20/23 0002     (approximate)   History   Chest Pain   HPI  Jose [name] is a 63 y.o. male with history of homelessness, substance abuse, PE on Eliquis, hypertension, hepatitis C, cirrhosis who presents to the emergency department with multiple complaints.  He complains of chronic neck pain after an injury that occurred years ago.  He is complaining of chest pain and shortness of breath.  No fever but has had cough.  Also having abdominal pain, dysuria, hematuria.  Denies penile discharge.  Denies any concern for STIs and states he is not sexually active.  Reports he has been drinking alcohol today.  Denies any drug use today but has history of cocaine use.  No lower extremity swelling or pain.   Denies SI, HI or hallucinations.  EMS reports that they picked him up [place] a gas station.   History provided by patient, EMS.    Past Medical History:  Diagnosis Date   EtOH dependence (HCC)    Hep C w/o coma, chronic (HCC)    Hypertension    Liver cirrhosis, alcoholic (HCC)    Polysubstance abuse (HCC)    Pulmonary embolism (HCC)     Past Surgical History:  Procedure Laterality Date   ESOPHAGOGASTRODUODENOSCOPY (EGD) WITH PROPOFOL N/A 03/02/2019   Procedure: ESOPHAGOGASTRODUODENOSCOPY (EGD) WITH PROPOFOL;  Surgeon: Violeta Gelinas, MD;  Location: [facility] ENDOSCOPY;  Service: General;  Laterality: N/A;   IR GASTROSTOMY TUBE REMOVAL  05/04/2019   PEG PLACEMENT N/A 03/02/2019   Procedure: PERCUTANEOUS ENDOSCOPIC GASTROSTOMY (PEG) PLACEMENT;  Surgeon: Violeta Gelinas, MD;  Location: [facility] ENDOSCOPY;  Service: General;  Laterality: N/A;   PERCUTANEOUS TRACHEOSTOMY N/A 02/12/2019   Procedure: PERCUTANEOUS TRACHEOSTOMY;  Surgeon: Violeta Gelinas, MD;  Location: [facility] OR;  Service: General;  Laterality: N/A;    MEDICATIONS:  Prior to Admission medications   Medication Sig Start Date  End Date Taking? Authorizing Provider  albuterol (VENTOLIN HFA) 108 (90 Base) MCG/ACT inhaler Inhale 2 puffs into the lungs every 6 (six) hours as needed for wheezing or shortness of breath. 10/14/23   [name], PA-C  amLODipine (NORVASC) 5 MG tablet Take 1 tablet (5 mg total) by mouth daily. 11/26/21 12/30/21  Dinnis Rog, Layla Maw, DO  apixaban (ELIQUIS) 5 MG TABS tablet Take 1 tablet (5 mg total) by mouth 2 (two) times daily. Patient not taking: Reported on 12/03/2022 11/26/21   Meet [place], Layla Maw, DO  pantoprazole (PROTONIX) 40 MG tablet Take 1 tablet (40 mg total) by mouth daily. 01/16/23 01/16/24  Pilar Jarvis, MD    Physical Exam   Triage Vital Signs: ED Triage Vitals  Encounter Vitals Group     BP 11/20/23 0009 (!) 140/75     Systolic BP Percentile --      Diastolic BP Percentile --      Pulse Rate 11/20/23 0009 88     Resp 11/20/23 0009 17     Temp 11/20/23 0009 98.1 F (36.7 C)     Temp Source 11/20/23 0009 Oral     SpO2 11/20/23 0009 98 %     Weight 11/20/23 0004 145 lb (65.8 kg)     Height 11/20/23 0004 5\' 8"  (1.727 m)     Head Circumference --      Peak Flow --      Pain Score 11/20/23 0004 10     Pain Loc --  Pain Education --      Exclude from Growth Chart --     Most recent vital signs: Vitals:   11/20/23 0355 11/20/23 0357  BP: (!) 138/90 (!) 138/90  Pulse: 75 75  Resp: 18   Temp: 98.4 F (36.9 C)   SpO2: 96%     CONSTITUTIONAL: Alert, responds appropriately to questions. Well-appearing; well-nourished, no distress, smells of alcohol HEAD: Normocephalic, atraumatic EYES: Conjunctivae clear, pupils appear equal, sclera nonicteric ENT: normal nose; moist mucous membranes NECK: Supple, normal ROM CARD: RRR; S1 and S2 appreciated RESP: Normal chest excursion without splinting or tachypnea; breath sounds clear and equal bilaterally; no wheezes, no rhonchi, no rales, no hypoxia or respiratory distress, speaking full sentences ABD/GI: Non-distended; soft,  non-tender, no rebound, no guarding, no peritoneal signs BACK: The back appears normal EXT: Normal ROM in all joints; no deformity noted, no edema, no calf tenderness or calf swelling SKIN: Normal color for age and race; warm; no rash on exposed skin NEURO: Moves all extremities equally, normal speech, no facial asymmetry, ambulates with normal gait PSYCH: The patient's mood and manner are appropriate.  Denies SI, HI.   ED Results / Procedures / Treatments   LABS: (all labs ordered are listed, but only abnormal results are displayed) Labs Reviewed  CBC WITH DIFFERENTIAL/PLATELET - Abnormal; Notable for the following components:      Result Value   RBC 3.94 (*)    Hemoglobin 12.9 (*)    HCT 38.9 (*)    Platelets 103 (*)    All other components within normal limits  COMPREHENSIVE METABOLIC PANEL - Abnormal; Notable for the following components:   Glucose, Bld 171 (*)    Albumin 3.4 (*)    AST 242 (*)    ALT 130 (*)    All other components within normal limits  LIPASE, BLOOD - Abnormal; Notable for the following components:   Lipase 58 (*)    All other components within normal limits  URINALYSIS, ROUTINE W REFLEX MICROSCOPIC - Abnormal; Notable for the following components:   Color, Urine YELLOW (*)    APPearance CLEAR (*)    All other components within normal limits  URINE DRUG SCREEN, QUALITATIVE ([facility] ONLY) - Abnormal; Notable for the following components:   Cocaine Metabolite,Ur [place] POSITIVE (*)    All other components within normal limits  ETHANOL - Abnormal; Notable for the following components:   Alcohol, Ethyl (B) 237 (*)    All other components within normal limits  D-DIMER, QUANTITATIVE - Abnormal; Notable for the following components:   D-Dimer, Quant 0.76 (*)    All other components within normal limits  RESP PANEL BY RT-PCR (RSV, FLU A&B, COVID)  [id number]  TROPONIN I (HIGH SENSITIVITY)  TROPONIN I (HIGH SENSITIVITY)     EKG:  EKG  Interpretation Date/Time:  Wednesday November 20 2023 00:06:49 EST Ventricular Rate:  102 PR Interval:  146 QRS Duration:  136 QT Interval:  400 QTC Calculation: 521 R Axis:   81  Text Interpretation: Sinus tachycardia with frequent and consecutive Premature ventricular complexes Right bundle branch block Abnormal ECG When compared with ECG of 14-Oct-2023 14:40, Premature ventricular complexes are now Present Confirmed by Rochele Raring [phone]) on 11/20/2023 12:31:46 AM         RADIOLOGY: My personal review and interpretation of imaging: CTA of the chest shows left lower lobe pneumonia.  CT of the abdomen pelvis shows no acute abnormality.  I have personally reviewed all radiology reports.  CT Angio Chest PE W and/or Wo Contrast Result Date: 11/20/2023 CLINICAL DATA:  Pulmonary embolism (PE) suspected, low to intermediate prob, positive D-dimer EXAM: CT ANGIOGRAPHY CHEST WITH CONTRAST TECHNIQUE: Multidetector CT imaging of the chest was performed using the standard protocol during bolus administration of intravenous contrast. Multiplanar CT image reconstructions and MIPs were obtained to evaluate the vascular anatomy. RADIATION DOSE REDUCTION: This exam was performed according to the departmental dose-optimization program which includes automated exposure control, adjustment of the mA and/or kV according to patient size and/or use of iterative reconstruction technique. CONTRAST:  75mL OMNIPAQUE IOHEXOL 350 MG/ML SOLN COMPARISON:  CT abdomen pelvis 12/30/2021 FINDINGS: Cardiovascular: Satisfactory opacification of the pulmonary arteries to the segmental level. No evidence of pulmonary embolism. Normal heart size. No significant pericardial effusion. The thoracic aorta is normal in caliber. Mild-to-moderate atherosclerotic plaque of the thoracic aorta. At least 2 vessel coronary artery calcifications. Mitral annular calcification. Aortic valve leaflet calcification. Mediastinum/Nodes: No enlarged  mediastinal, hilar, or axillary lymph nodes. Thyroid gland, trachea, and esophagus demonstrate no significant findings. Lungs/Pleura: Biapical paraseptal and centrilobular mild emphysematous changes. Diffuse mild centrilobular emphysematous changes. Diffuse bronchial wall thickening. Interval development of left lower lobe patchy airspace opacity. Slightly nodular-like density at the left lower lobe measuring up to 1 cm. Lingular atelectasis. No pulmonary mass. No pleural effusion. No pneumothorax. Upper Abdomen: No acute abnormality. Musculoskeletal: No chest wall abnormality. No suspicious lytic or blastic osseous lesions. No acute displaced fracture. Multilevel mild degenerative changes of the spine. Review of the MIP images confirms the above findings. IMPRESSION: 1. No pulmonary embolus. 2. Bronchial wall thickening with interval development of left lower lobe patchy airspace opacities with some nodular like densities at the left lower lobe. Finding likely represents combination of infection/inflammation and atelectasis. Recommend attention on follow-up in 3 months to evaluate for complete resolution and underlying possible nodule. 3.  Emphysema (ICD10-J43.9). 4. Aortic Atherosclerosis (ICD10-I70.0) including at least 2 vessel coronary calcification, mitral annular, aortic valve leaflet calcification-correlate for aortic stenosis. Electronically Signed   By: Tish Frederickson M.D.   On: 11/20/2023 06:11   CT ABDOMEN PELVIS W CONTRAST Result Date: 11/20/2023 CLINICAL DATA:  Left lower quadrant abdominal pain, dysuria, hematuria EXAM: CT ABDOMEN AND PELVIS WITH CONTRAST TECHNIQUE: Multidetector CT imaging of the abdomen and pelvis was performed using the standard protocol following bolus administration of intravenous contrast. RADIATION DOSE REDUCTION: This exam was performed according to the departmental dose-optimization program which includes automated exposure control, adjustment of the mA and/or kV  according to patient size and/or use of iterative reconstruction technique. CONTRAST:  OMNIPAQUE IOHEXOL 300 MG/ML  SOLN COMPARISON:  01/16/2023 FINDINGS: Lower chest: Patchy/nodular left basilar opacity (series 4/image 21), new. Mild infection/pneumonia is favored over atelectasis. Hepatobiliary: Liver is within normal limits. Gallbladder is unremarkable. No intrahepatic or extrahepatic ductal dilatation. Pancreas: Within normal limits. Spleen: Within normal limits. Adrenals/Urinary Tract: Adrenal glands are within normal limits. Kidneys are within normal.  No hydronephrosis. Bladder is within normal limits. Stomach/Bowel: Stomach is within normal limits. No evidence of bowel obstruction. Appendix is not discretely visualized. No colonic wall thickening or inflammatory changes. Vascular/Lymphatic: No evidence of abdominal aortic aneurysm. Atherosclerotic calcifications of the abdominal aorta and branch vessels, although vessels remain patent. No suspicious abdominopelvic lymphadenopathy. Reproductive: Prostate is unremarkable. Other: No abdominopelvic ascites. Musculoskeletal: Degenerative changes of the visualized thoracolumbar spine. IMPRESSION: No CT findings to account for the patient's left lower quadrant abdominal pain. Patchy/nodular left basilar opacity, new. Mild infection/pneumonia is favored over  atelectasis. Consider follow-up CT chest in 6-12 weeks (after appropriate antimicrobial therapy). Electronically Signed   By: Charline Bills M.D.   On: 11/20/2023 02:12   DG Chest Portable 1 View Result Date: 11/20/2023 CLINICAL DATA:  Chest pain, shortness of breath EXAM: PORTABLE CHEST 1 VIEW COMPARISON:  10/14/2023 FINDINGS: Lungs are clear.  No pleural effusion or pneumothorax. The heart is normal in size.  Thoracic aortic atherosclerosis. IMPRESSION: No acute cardiopulmonary disease. Electronically Signed   By: Charline Bills M.D.   On: 11/20/2023 01:02     PROCEDURES:  Critical Care  performed: No    Procedures    IMPRESSION / MDM / ASSESSMENT AND PLAN / ED COURSE  I reviewed the triage vital signs and the nursing notes.    Patient here with multiple complaints.  Well-known to our emergency department.    DIFFERENTIAL DIAGNOSIS (includes but not limited to):   ACS, PE, pneumonia, dissection less likely, colitis, diverticulitis, UTI, kidney stone   Patient's presentation is most consistent with acute presentation with potential threat to life or bodily function.   PLAN: Will obtain cardiac labs, abdominal labs, urine, chest x-ray, CT abdomen pelvis.  EKG shows no new ischemic change.  Will start him on CIWA protocol.   MEDICATIONS GIVEN IN ED: Medications  LORazepam (ATIVAN) injection 0-4 mg ( Intravenous See Alternative 11/20/23 0101)    Or  LORazepam (ATIVAN) tablet 0-4 mg (1 mg Oral Given 11/20/23 0101)  LORazepam (ATIVAN) injection 0-4 mg (has no administration in time range)    Or  LORazepam (ATIVAN) tablet 0-4 mg (has no administration in time range)  thiamine (VITAMIN B1) tablet 100 mg (has no administration in time range)    Or  thiamine (VITAMIN B1) injection 100 mg (has no administration in time range)  iohexol (OMNIPAQUE) 300 MG/ML solution 100 mL (100 mLs Intravenous Contrast Given 11/20/23 0147)  cefTRIAXone (ROCEPHIN) 2 g in sodium chloride 0.9 % 100 mL IVPB (0 g Intravenous Stopped 11/20/23 0409)  azithromycin (ZITHROMAX) 500 mg in sodium chloride 0.9 % 250 mL IVPB (500 mg Intravenous New Bag/Given 11/20/23 0440)  ketorolac (TORADOL) 30 MG/ML injection 30 mg (30 mg Intravenous Given 11/20/23 0330)  sodium chloride 0.9 % bolus 1,000 mL (0 mLs Intravenous Stopped 11/20/23 0524)  oxyCODONE (Oxy IR/ROXICODONE) immediate release tablet 5 mg (5 mg Oral Given 11/20/23 0357)  iohexol (OMNIPAQUE) 350 MG/ML injection 75 mL (75 mLs Intravenous Contrast Given 11/20/23 0527)     ED COURSE: Chest x-ray reviewed and interpreted by myself and radiologist and  shows no acute abnormality.  CT of the abdomen pelvis shows no findings in the abdomen but he does have a left basilar opacity that is concerning for infection.  Will obtain COVID, flu and RSV swabs.  Troponin x 2 negative.  Alcohol level of 237.  AST greater than ALT consistent with alcohol abuse.  No leukocytosis.  Urine shows no infection today.  Drug screen positive for cocaine.  Patient still complaining of discomfort.  Will give Toradol.  Will give Rocephin, azithromycin to cover for bacterial pneumonia.  He has no signs or symptoms of sepsis.  No hypoxia.   D-dimer was elevated so CTA of the chest was obtained.  CT scan reviewed and interpreted by myself and the radiologist and redemonstrated left lower lobe pneumonia.  No PE.  He is not hypoxic or septic here today and has been well-appearing, ambulatory, tolerating p.o.  Will discharge with cefdinir, azithromycin, ibuprofen.  Will give outpatient follow-up.  At this time, I do not feel there is any life-threatening condition present. I reviewed all nursing notes, vitals, pertinent previous records.  All lab and urine results, EKGs, imaging ordered have been independently reviewed and interpreted by myself.  I reviewed all available radiology reports from any imaging ordered this visit.  Based on my assessment, I feel the patient is safe to be discharged home without further emergent workup and can continue workup as an outpatient as needed. Discussed all findings, treatment plan as well as usual and customary return precautions.  They verbalize understanding and are comfortable with this plan.  Outpatient follow-up has been provided as needed.  All questions have been answered.   CONSULTS: Admission considered for treatment for pneumonia but given patient is nontoxic in appearance without signs or symptoms of sepsis, no hypoxia, I feel he can be managed as an outpatient.   OUTSIDE RECORDS REVIEWED: Reviewed last admission in January  2023.       FINAL CLINICAL IMPRESSION(S) / ED DIAGNOSES   Final diagnoses:  Nonspecific chest pain  Lower abdominal pain  Alcoholic intoxication without complication (HCC)  Pneumonia of left lower lobe due to infectious organism  Chronic neck pain     Rx / DC Orders   ED Discharge Orders          Ordered    cefdinir (OMNICEF) 300 MG capsule  2 times daily        11/20/23 0639    azithromycin (ZITHROMAX Z-PAK) 250 MG tablet        11/20/23 0639    ibuprofen (ADVIL) 800 MG tablet  Every 8 hours PRN        11/20/23 9604             Note:  This document was prepared using Dragon voice recognition software and may include unintentional dictation errors.   Petrona Wyeth, Layla Maw, DO 11/20/23 [phone]

## 2023-11-20 NOTE — ED Triage Notes (Addendum)
 Patient came in via [facility]. [place] States has had CP for the past 3/4 days. ETOH use had a couple of beers today and drug use in the past two days. CBG 264 denies hx of diabetes. EMS gave patient 324 mg ASA on way to ED.

## 2023-11-21 ENCOUNTER — Other Ambulatory Visit: Payer: Self-pay

## 2023-11-21 MED ORDER — IBUPROFEN 800 MG PO TABS
800.0000 mg | ORAL_TABLET | Freq: Three times a day (TID) | ORAL | 0 refills | Status: DC | PRN
  Filled 2023-11-21: qty 30, 10d supply, fill #0

## 2023-11-22 ENCOUNTER — Other Ambulatory Visit: Payer: Self-pay

## 2023-11-25 ENCOUNTER — Emergency Department
Admission: EM | Admit: 2023-11-25 | Discharge: 2023-11-26 | Disposition: A | Discharge status: Home / Self Care | Payer: MEDICAID | Attending: Emergency Medicine | Admitting: Emergency Medicine

## 2023-11-25 ENCOUNTER — Other Ambulatory Visit: Payer: Self-pay

## 2023-11-25 ENCOUNTER — Emergency Department
Admission: EM | Admit: 2023-11-25 | Discharge: 2023-11-25 | Disposition: A | Discharge status: Home / Self Care | Payer: MEDICAID | Attending: Emergency Medicine | Admitting: Emergency Medicine

## 2023-11-25 ENCOUNTER — Emergency Department: Payer: MEDICAID

## 2023-11-25 ENCOUNTER — Encounter: Payer: Self-pay | Admitting: *Deleted

## 2023-11-25 DIAGNOSIS — W19XXXA Unspecified fall, initial encounter: Secondary | ICD-10-CM | POA: Insufficient documentation

## 2023-11-25 DIAGNOSIS — Z5900 Homelessness unspecified: Secondary | ICD-10-CM | POA: Insufficient documentation

## 2023-11-25 DIAGNOSIS — F1029 Alcohol dependence with unspecified alcohol-induced disorder: Secondary | ICD-10-CM | POA: Insufficient documentation

## 2023-11-25 DIAGNOSIS — R0789 Other chest pain: Secondary | ICD-10-CM | POA: Insufficient documentation

## 2023-11-25 DIAGNOSIS — M545 Low back pain, unspecified: Secondary | ICD-10-CM | POA: Insufficient documentation

## 2023-11-25 DIAGNOSIS — R079 Chest pain, unspecified: Secondary | ICD-10-CM | POA: Diagnosis present

## 2023-11-25 HISTORY — DX: Cocaine use, unspecified, uncomplicated: F14.90

## 2023-11-25 LAB — CBC
HCT: 41.5 % (ref 39.0–52.0)
Hemoglobin: 14.2 g/dL (ref 13.0–17.0)
MCH: 33 pg (ref 26.0–34.0)
MCHC: 34.2 g/dL (ref 30.0–36.0)
MCV: 96.5 fL (ref 80.0–100.0)
Platelets: 93 10*3/uL — ABNORMAL LOW (ref 150–400)
RBC: 4.3 MIL/uL (ref 4.22–5.81)
RDW: 13.9 % (ref 11.5–15.5)
WBC: 6.1 10*3/uL (ref 4.0–10.5)
nRBC: 0 % (ref 0.0–0.2)

## 2023-11-25 LAB — BASIC METABOLIC PANEL
Anion gap: 13 (ref 5–15)
BUN: <5 mg/dL — ABNORMAL LOW (ref 8–23)
CO2: 23 mmol/L (ref 22–32)
Calcium: 9.7 mg/dL (ref 8.9–10.3)
Chloride: 100 mmol/L (ref 98–111)
Creatinine, Ser: 0.61 mg/dL (ref 0.61–1.24)
GFR, Estimated: >60 mL/min (ref >60)
Glucose, Bld: 112 mg/dL — ABNORMAL HIGH (ref 70–99)
Potassium: 3.4 mmol/L — ABNORMAL LOW (ref 3.5–5.1)
Sodium: 136 mmol/L (ref 135–145)

## 2023-11-25 LAB — TROPONIN I (HIGH SENSITIVITY): Troponin I (High Sensitivity): 9 ng/L (ref <18)

## 2023-11-25 NOTE — ED Triage Notes (Signed)
 Pt arrived by [facility] from [facility] for reported CP that started 2 days ago, pt also reports bilateral arms and leg pain, also endorses SOB, pt is a daily smoker and chronic alcoholic, pt admits to having 3-08 oz beers tonight. Cocaine use two days ago as well. Denies n/v/d. A&O x4, NAD noted at current. VSS.

## 2023-11-25 NOTE — ED Triage Notes (Signed)
 First RN Note: Pt to ED via [facility]. Pt endorses drinking 2 40 oz beers today. Per EMS pt also c/o chest pain, EMS reports pt is also homeless. Pt presents alert, cooperative and in NAD at this time.

## 2023-11-25 NOTE — ED Provider Triage Note (Signed)
 Emergency Medicine Provider Triage Evaluation Note  Jose [name] , a 63 y.o. male  was evaluated in triage.  Pt complains of cough, vomit..  Patient has been seen the last days in ED. patient has history of alcohol use, patient states last alcohol use was today 1 hour ago.  Patient states he does not have a place to  sleep tonight  Review of Systems  Positive:  Negative:   Physical Exam  BP 114/71 (BP Location: Left Arm)   Pulse (!) 107   Temp 98.3 F (36.8 C) (Oral)   Resp 18   Ht (P) 5\' 8"  (1.727 m)   Wt (P) 65 kg   SpO2 99%   BMI (P) 21.79 kg/m  Gen:   Awake, no distress   Resp:  Normal effort  MSK:   Moves extremities without difficulty  Other:   Medical Decision Making  Medically screening exam initiated at 7:58 PM.  Appropriate orders placed.  Jose [name] was informed that the remainder of the evaluation will be completed by another provider, this initial triage assessment does not replace that evaluation, and the importance of remaining in the ED until their evaluation is complete.     Jose Damme, PA-C 11/25/23 2001

## 2023-11-25 NOTE — ED Triage Notes (Signed)
 Pt to triage via wheelchair.  Pt reports low back pain, cough and vomiting.   Recent dx of pneumonia. Pt alert.

## 2023-11-25 NOTE — ED Notes (Signed)
 Extra lab tubes sent to lab for any additional lab orders, blue, dark green, and red top

## 2023-11-25 NOTE — ED Notes (Signed)
 Patient provided with food and a drink. Preparing for D/C at this time.

## 2023-11-25 NOTE — ED Provider Notes (Signed)
 [facility] Provider Note    Event Date/Time   First MD Initiated Contact with Patient 11/25/23 [phone]     (approximate)   History   Chest Pain   HPI Jose [name] is a 63 y.o. male with a history of homelessness and frequently with chest pain, history of alcohol dependence and polysubstance abuse.  The patient presents tonight by EMS from the [facility] for evaluation of chest pain.  He said has been going on about 2 days.  He was seen about a week ago and was diagnosed with pneumonia but states that he took his antibiotics.  It is unclear why he chose to call EMS tonight if the pain has been going on for 2 days.  He said he has pain down his legs and down both of his arms but that is always the case.  Denies nausea and vomiting.  States he drank two 40-oz bottles of malt liquor tonight.     Physical Exam   Triage Vital Signs: ED Triage Vitals  Encounter Vitals Group     BP 11/25/23 0432 112/81     Systolic BP Percentile --      Diastolic BP Percentile --      Pulse Rate 11/25/23 0432 90     Resp 11/25/23 0432 16     Temp 11/25/23 0432 98.1 F (36.7 C)     Temp Source 11/25/23 0432 Oral     SpO2 11/25/23 0432 97 %     Weight 11/25/23 0436 65.8 kg (145 lb)     Height 11/25/23 0436 1.727 m (5\' 8" )     Head Circumference --      Peak Flow --      Pain Score 11/25/23 0435 10     Pain Loc --      Pain Education --      Exclude from Growth Chart --     Most recent vital signs: Vitals:   11/25/23 0522 11/25/23 0613  BP: (!) 143/92 121/76  Pulse: 82 85  Resp: 16 16  Temp:    SpO2: 96% 96%    General: Somnolent, appears intoxicated but is able to wake up and answer questions. CV:  Good peripheral perfusion.  Regular rhythm.  Normal heart sounds. Resp:  Normal effort. Speaking easily and comfortably though with slurred speech due to alcohol intoxication, no accessory muscle usage nor intercostal retractions.  Lungs are clear to  auscultation. Abd:  No distention.  Other:  Moving all 4 extremities without any apparent difficulty.   ED Results / Procedures / Treatments   Labs (all labs ordered are listed, but only abnormal results are displayed) Labs Reviewed  BASIC METABOLIC PANEL - Abnormal; Notable for the following components:      Result Value   Potassium 3.4 (*)    Glucose, Bld 112 (*)    BUN <5 (*)    All other components within normal limits  CBC - Abnormal; Notable for the following components:   Platelets 93 (*)    All other components within normal limits  TROPONIN I (HIGH SENSITIVITY)     EKG  ED ECG REPORT I, Loleta Rose, the attending physician, personally viewed and interpreted this ECG.  Date: 11/25/2023 EKG Time: 4:33 AM Rate: 91 Rhythm: normal sinus rhythm QRS Axis: normal Intervals: Right bundle branch block ST/T Wave abnormalities: Non-specific ST segment / T-wave changes, but no clear evidence of acute ischemia. Narrative Interpretation: no definitive evidence of acute ischemia;  does not meet STEMI criteria.    RADIOLOGY See hospital course for details regarding the 2 view chest x-ray   PROCEDURES:  Critical Care performed: No  Procedures    IMPRESSION / MDM / ASSESSMENT AND PLAN / ED COURSE  I reviewed the triage vital signs and the nursing notes.                              Differential diagnosis includes, but is not limited to, musculoskeletal strain, ACS, PE, pneumonia, secondary gain.  Patient's presentation is most consistent with acute presentation with potential threat to life or bodily function.  Labs/studies ordered: EKG, high-sensitivity troponin, CBC, basic metabolic panel  Interventions/Medications given:  Medications - No data to display  (Note:  hospital course my include additional interventions and/or labs/studies not listed above.)   Patient with multiple prior visits for chest pain.  He reportedly has some issues with polysubstance  abuse but most pacifically with alcohol.  Vital signs are stable, no ischemia on EKG.  Awaiting results of labs including high-sensitivity troponin, but anticipate discharge if the labs are reassuring.  Patient is wanting something to eat and drink and I have a low suspicion for acute or emergent medical condition at this time.     Clinical Course as of 11/25/23 0618  Mon Nov 25, 2023  0549 No acute abnormalities on chest x-ray.  High-sensitivity troponin is negative and base metabolic panel and CBC are all within normal limits other than some mild thrombocytopenia [CF]  0610 Patient wanting food, will provide some food and discharge.  No indication for repeat high-sensitivity troponin since he has had the same pain for 2 days. [CF]    Clinical Course User Index [CF] Loleta Rose, MD     FINAL CLINICAL IMPRESSION(S) / ED DIAGNOSES   Final diagnoses:  Atypical chest pain  Alcohol dependence with unspecified alcohol-induced disorder [facility])     Rx / DC Orders   ED Discharge Orders     None        Note:  This document was prepared using Dragon voice recognition software and may include unintentional dictation errors.   Loleta Rose, MD 11/25/23 [phone]

## 2023-11-25 NOTE — ED Notes (Signed)
 Patient states he is homeless and does not need a ride arranged. Patients brother to pick patient up from the ED lobby.

## 2023-11-25 NOTE — Discharge Instructions (Signed)
 Your workup in the Emergency Department today was reassuring.  We did not find any specific abnormalities.  We recommend you drink plenty of fluids, take your regular medications and/or any new ones prescribed today, and follow up with the doctor(s) listed in these documents as recommended.  Return to the Emergency Department if you develop new or worsening symptoms that concern you.

## 2023-11-26 MED ORDER — ACETAMINOPHEN 500 MG PO TABS
1000.0000 mg | ORAL_TABLET | Freq: Once | ORAL | Status: AC
  Administered 2023-11-26: 1000 mg via ORAL
  Filled 2023-11-26: qty 2

## 2023-11-26 MED ORDER — IBUPROFEN 600 MG PO TABS
600.0000 mg | ORAL_TABLET | Freq: Once | ORAL | Status: AC
  Administered 2023-11-26: 600 mg via ORAL
  Filled 2023-11-26: qty 1

## 2023-11-26 NOTE — ED Notes (Signed)
 Pt requesting sprite. Pt given water and told about vending machines in lobby. Pt states will wait in lobby until ride is here at 9am. Pt taken to lobby with blankets and pillow - first RN made aware.

## 2023-11-26 NOTE — Discharge Instructions (Addendum)
 Please take over-the-counter ibuprofen and/or Tylenol as needed for your pain.  Please read the attached resource guide for information about local shelters.

## 2023-11-26 NOTE — ED Provider Notes (Signed)
 [facility] Provider Note    Event Date/Time   First MD Initiated Contact with Patient 11/25/23 2332     (approximate)   History   Back Pain   HPI Jose [name] is a 63 y.o. male well-known to the emergency department with frequent visits usually due to alcohol dependence and homelessness.  The presents tonight saying his low back hurts.  I saw him less than 24 hours ago after allegedly having a fall and a reassuring workup and medical clearance.  He states he walked all over town today and by tonight his back hurt so he came in.  He still has a cough which has been present for weeks.  No chest pain or shortness of breath at this time.     Physical Exam   Triage Vital Signs: ED Triage Vitals  Encounter Vitals Group     BP 11/25/23 1956 114/71     Systolic BP Percentile --      Diastolic BP Percentile --      Pulse Rate 11/25/23 1956 (!) 107     Resp 11/25/23 1956 18     Temp 11/25/23 1956 98.3 F (36.8 C)     Temp Source 11/25/23 1956 Oral     SpO2 11/25/23 1956 99 %     Weight 11/25/23 1955 (P) 65 kg (143 lb 4.8 oz)     Height 11/25/23 1955 (P) 1.727 m (5\' 8" )     Head Circumference --      Peak Flow --      Pain Score 11/25/23 1955 10     Pain Loc --      Pain Education --      Exclude from Growth Chart --     Most recent vital signs: Vitals:   11/25/23 1956  BP: 114/71  Pulse: (!) 107  Resp: 18  Temp: 98.3 F (36.8 C)  SpO2: 99%    General: Awake, no distress.  Watching TV comfortably. CV:  Good peripheral perfusion.  Resp:  Normal effort. Speaking easily and comfortably, no accessory muscle usage nor intercostal retractions.  Lungs are clear.  Occasional cough. Abd:  No distention.  No tenderness to palpation. Other:  No tenderness to palpation of the midline of the back along the T and L-spines.  No tenderness to percussion of the flanks.    ED Results / Procedures / Treatments   Labs (all labs ordered are listed,  but only abnormal results are displayed) Labs Reviewed - No data to display    PROCEDURES:  Critical Care performed: No  Procedures    IMPRESSION / MDM / ASSESSMENT AND PLAN / ED COURSE  I reviewed the triage vital signs and the nursing notes.                              Differential diagnosis includes, but is not limited to, malingering, musculoskeletal pain/strain, viral illness, pneumonia, less likely PE.  Patient's presentation is most consistent with exacerbation of chronic illness.    Patient has a persistent cough and musculoskeletal pain in the setting of a recent fall, but I suspect that there is a high degree of malingering as well given the cold weather and his unhoused status.  I advised him that there is no evidence of an emergent condition and no need to repeat any imaging tonight and he was agreeable to a dose of Tylenol and ibuprofen at discharge.  I gave him information about local shelters and gave my usual return precautions.         FINAL CLINICAL IMPRESSION(S) / ED DIAGNOSES   Final diagnoses:  Low back pain without sciatica, unspecified back pain laterality, unspecified chronicity     Rx / DC Orders   ED Discharge Orders     None        Note:  This document was prepared using Dragon voice recognition software and may include unintentional dictation errors.   Loleta Rose, MD 11/26/23 [phone]

## 2023-11-29 ENCOUNTER — Emergency Department
Admission: EM | Admit: 2023-11-29 | Discharge: 2023-11-29 | Disposition: A | Discharge status: Home / Self Care | Payer: MEDICAID | Attending: Emergency Medicine | Admitting: Emergency Medicine

## 2023-11-29 ENCOUNTER — Other Ambulatory Visit: Payer: Self-pay

## 2023-11-29 DIAGNOSIS — Y906 Blood alcohol level of 120-199 mg/100 ml: Secondary | ICD-10-CM | POA: Diagnosis not present

## 2023-11-29 DIAGNOSIS — F10920 Alcohol use, unspecified with intoxication, uncomplicated: Secondary | ICD-10-CM

## 2023-11-29 DIAGNOSIS — Z5900 Homelessness unspecified: Secondary | ICD-10-CM | POA: Diagnosis not present

## 2023-11-29 DIAGNOSIS — F10129 Alcohol abuse with intoxication, unspecified: Secondary | ICD-10-CM | POA: Insufficient documentation

## 2023-11-29 DIAGNOSIS — F10939 Alcohol use, unspecified with withdrawal, unspecified: Secondary | ICD-10-CM | POA: Diagnosis present

## 2023-11-29 DIAGNOSIS — R45850 Homicidal ideations: Secondary | ICD-10-CM

## 2023-11-29 DIAGNOSIS — F1010 Alcohol abuse, uncomplicated: Secondary | ICD-10-CM | POA: Diagnosis present

## 2023-11-29 DIAGNOSIS — F419 Anxiety disorder, unspecified: Secondary | ICD-10-CM | POA: Insufficient documentation

## 2023-11-29 LAB — COMPREHENSIVE METABOLIC PANEL
ALT: 158 U/L — ABNORMAL HIGH (ref 0–44)
AST: 304 U/L — ABNORMAL HIGH (ref 15–41)
Albumin: 3.7 g/dL (ref 3.5–5.0)
Alkaline Phosphatase: 83 U/L (ref 38–126)
Anion gap: 9 (ref 5–15)
BUN: 7 mg/dL — ABNORMAL LOW (ref 8–23)
CO2: 24 mmol/L (ref 22–32)
Calcium: 9.5 mg/dL (ref 8.9–10.3)
Chloride: 101 mmol/L (ref 98–111)
Creatinine, Ser: 0.57 mg/dL — ABNORMAL LOW (ref 0.61–1.24)
GFR, Estimated: >60 mL/min (ref >60)
Glucose, Bld: 109 mg/dL — ABNORMAL HIGH (ref 70–99)
Potassium: 3.9 mmol/L (ref 3.5–5.1)
Sodium: 134 mmol/L — ABNORMAL LOW (ref 135–145)
Total Bilirubin: 1.2 mg/dL (ref 0.0–1.2)
Total Protein: 8.3 g/dL — ABNORMAL HIGH (ref 6.5–8.1)

## 2023-11-29 LAB — CBC
HCT: 39.5 % (ref 39.0–52.0)
Hemoglobin: 13.5 g/dL (ref 13.0–17.0)
MCH: 33.3 pg (ref 26.0–34.0)
MCHC: 34.2 g/dL (ref 30.0–36.0)
MCV: 97.5 fL (ref 80.0–100.0)
Platelets: 94 10*3/uL — ABNORMAL LOW (ref 150–400)
RBC: 4.05 MIL/uL — ABNORMAL LOW (ref 4.22–5.81)
RDW: 14.4 % (ref 11.5–15.5)
WBC: 9.6 10*3/uL (ref 4.0–10.5)
nRBC: 0 % (ref 0.0–0.2)

## 2023-11-29 LAB — URINE DRUG SCREEN, QUALITATIVE (ARMC ONLY)
Amphetamines, Ur Screen: NOT DETECTED
Barbiturates, Ur Screen: NOT DETECTED
Benzodiazepine, Ur Scrn: NOT DETECTED
Cannabinoid 50 Ng, Ur ~~LOC~~: NOT DETECTED
Cocaine Metabolite,Ur ~~LOC~~: POSITIVE — AB
MDMA (Ecstasy)Ur Screen: NOT DETECTED
Methadone Scn, Ur: NOT DETECTED
Opiate, Ur Screen: NOT DETECTED
Phencyclidine (PCP) Ur S: NOT DETECTED
Tricyclic, Ur Screen: NOT DETECTED

## 2023-11-29 LAB — ETHANOL: Alcohol, Ethyl (B): 178 mg/dL — ABNORMAL HIGH (ref <10)

## 2023-11-29 MED ORDER — LORAZEPAM 2 MG PO TABS
2.0000 mg | ORAL_TABLET | Freq: Once | ORAL | Status: AC
  Administered 2023-11-29: 2 mg via ORAL
  Filled 2023-11-29: qty 1

## 2023-11-29 MED ORDER — ACETAMINOPHEN 500 MG PO TABS
1000.0000 mg | ORAL_TABLET | Freq: Once | ORAL | Status: AC
  Administered 2023-11-29: 1000 mg via ORAL
  Filled 2023-11-29: qty 2

## 2023-11-29 MED ORDER — HYDROXYZINE HCL 10 MG PO TABS
10.0000 mg | ORAL_TABLET | Freq: Once | ORAL | Status: DC
  Filled 2023-11-29: qty 1

## 2023-11-29 MED ORDER — LORAZEPAM 2 MG PO TABS: 2.0000 mg | ORAL_TABLET | ORAL | Status: DC | PRN

## 2023-11-29 NOTE — Consult Note (Incomplete)
 [facility] Psychiatric Consult Initial  Patient Name: .Jose [name]  MRN: [id number]  DOB: [birth year]  Consult Order details:  Orders (From admission, onward)     Start     Ordered   11/29/23 2233  CONSULT TO CALL ACT TEAM       Ordering Provider: Pilar Jarvis, MD  Provider:  (Not yet assigned)  Question:  Reason for Consult?  Answer:  HI and visual hallucinations   11/29/23 2232             Mode of Visit: Tele-visit Virtual Statement:TELE PSYCHIATRY ATTESTATION & CONSENT As the provider for this telehealth consult, I attest that I verified the patient's identity using two separate identifiers, introduced myself to the patient, provided my credentials, disclosed my location, and performed this encounter via a HIPAA-compliant, real-time, face-to-face, two-way, interactive audio and video platform and with the full consent and agreement of the patient (or guardian as applicable.) Patient physical location: [facility] ER. Telehealth provider physical location: home office in state of [place].   Video start time:   Video end time:      Psychiatry Consult Evaluation  Service Date: November 29, 2023 LOS:  LOS: 0 days  Chief Complaint ***  Primary Psychiatric Diagnoses  Active Hospital problems: Principal Problem:   Homicidal behavior Active Problems:   Alcohol withdrawal [facility])    Assessment  Jose [name] is a 63 y.o. male admitted: {CHL BH Medical or Presented to GM:01027}OZD 11/29/2023 10:37 PM for ***. He carries the psychiatric diagnoses of *** and has a past medical history of  ***.   His current presentation of *** is most consistent with ***. He meets criteria for *** based on ***.  Current outpatient psychotropic medications include *** and historically he has had a *** response to these medications. He was *** compliant with medications prior to admission as evidenced by ***. On initial examination, patient ***. Please see plan below for detailed recommendations.    Diagnoses:  Active Hospital problems: Principal Problem:   Homicidal behavior Active Problems:   Alcohol withdrawal (HCC)    Plan   ## Psychiatric Medication Recommendations:  ***  ## Medical Decision Making Capacity: {CHL BH MEDICAL DECISION MAKING CAPACITY:31818}  ## Further Work-up:  -- *** {CHLmacgeneralandspecificworkuprecs:31821} -- most recent EKG on *** had QtC of *** -- Pertinent labwork reviewed earlier this admission includes: ***   ## Disposition:-- {CHLmaccldispo:31820}  ## Behavioral / Environmental: -{CHLmacbehavioralenvironmental2:31847}    ## Safety and Observation Level:  - Based on my clinical evaluation, I estimate the patient to be at *** risk of self harm in the current setting. - At this time, we recommend  {CHL BH SUICIDE OBSERVATION LEVEL:31850}. This decision is based on my review of the chart including patient's history and current presentation, interview of the patient, mental status examination, and consideration of suicide risk including evaluating suicidal ideation, plan, intent, suicidal or self-harm behaviors, risk factors, and protective factors. This judgment is based on our ability to directly address suicide risk, implement suicide prevention strategies, and develop a safety plan while the patient is in the clinical setting. Please contact our team if there is a concern that risk level has changed.  CSSR Risk Category:C-SSRS RISK CATEGORY: No Risk  Suicide Risk Assessment: Patient has following modifiable risk factors for suicide: {CHLmacmodifiablesuicideriskfactors:31822}, which we are addressing by ***. Patient has following non-modifiable or demographic risk factors for suicide: {CHLmacnonmodifiablesuicideriskfactors:31823} Patient has the following protective factors against suicide: {CHLmacprotectivefactors:31824}  Thank you for this consult  request. Recommendations have been communicated to the primary team.  We will *** at this  time.   Jearld Lesch, NP       History of Present Illness  Relevant Aspects of Hospital [facility] or ED course:31819} Course:  Admitted on 11/29/2023 for ***. They ***.   Patient Report:  Patient states that he has ha  4 40 ounces of beer.  He says tonight he has problem wit his friends but denies wanting to anybody. Denies SI and past attempts.  He says he feels he just needed to calm down.  Patient states he has "bad withdrawals".   Psych ROS:  Depression: *** Anxiety:  *** Mania (lifetime and current): *** Psychosis: (lifetime and current): ***  Collateral information:  Contacted *** at *** on ***  ROS   Psychiatric and Social History  Psychiatric History:  Information collected from ***  Prev Dx/Sx: *** Current Psych Provider: *** Home Meds (current): *** Previous Med Trials: *** Therapy: ***  Prior Psych Hospitalization: ***  Prior Self Harm: *** Prior Violence: ***  Family Psych History: *** Family Hx suicide: ***  Social History:  Developmental Hx: *** Educational Hx: *** Occupational Hx: *** Legal Hx: *** Living Situation: *** Spiritual Hx: *** Access to weapons/lethal means: ***   Substance History Alcohol: ***  Type of alcohol *** Last Drink *** Number of drinks per day *** History of alcohol withdrawal seizures *** History of DT's *** Tobacco: *** Illicit drugs: *** Prescription drug abuse: *** Rehab hx: ***  Exam Findings  Physical Exam: *** Vital Signs:  Temp:  [98.4 F (36.9 C)] 98.4 F (36.9 C) (03/07 2230) Pulse Rate:  [93] 93 (03/07 2230) Resp:  [16] 16 (03/07 2230) BP: (142)/(95) 142/95 (03/07 2230) SpO2:  [96 %] 96 % (03/07 2230) Weight:  [65.8 kg] 65.8 kg (03/07 2230) Blood pressure (!) 142/95, pulse 93, temperature 98.4 F (36.9 C), temperature source Oral, resp. rate 16, height 5\' 8"  (1.727 m), weight 65.8 kg, SpO2 96%. Body mass index is 22.05 kg/m.  Physical Exam  Mental Status Exam: General Appearance:  {Appearance:22683}  Orientation:  {BHH ORIENTATION (PAA):22689}  Memory:  {BHH MEMORY:22881}  Concentration:  {Concentration:21399}  Recall:  {BHH GOOD/FAIR/POOR:22877}  Attention  {BH Attention Span:31825}  Eye Contact:  {BHH EYE CONTACT:22684}  Speech:  {Speech:22685}  Language:  {BHH GOOD/FAIR/POOR:22877}  Volume:  {Volume (PAA):22686}  Mood: ***  Affect:  {Affect (PAA):22687}  Thought Process:  {Thought Process (PAA):22688}  Thought Content:  {Thought Content:22690}  Suicidal Thoughts:  {ST/HT (PAA):22692}  Homicidal Thoughts:  {ST/HT (PAA):22692}  Judgement:  {Judgement (PAA):22694}  Insight:  {Insight (PAA):22695}  Psychomotor Activity:  {Psychomotor (PAA):22696}  Akathisia:  {BHH YES OR NO:22294}  Fund of Knowledge:  {BHH GOOD/FAIR/POOR:22877}      Assets:  {Assets (PAA):22698}  Cognition:  {chl bhh cognition:304700322}  ADL's:  {BHH ZOX'W:96045}  AIMS (if indicated):        Other History   These have been pulled in through the EMR, reviewed, and updated if appropriate.  Family History:  The patient's family history is not on file.  Medical History: Past Medical History:  Diagnosis Date  . Cocaine use   . EtOH dependence (HCC)   . Hep C w/o coma, chronic (HCC)   . Hypertension   . Liver cirrhosis, alcoholic (HCC)   . Polysubstance abuse (HCC)   . Pulmonary embolism [facility])     Surgical History: Past Surgical History:  Procedure Laterality Date  . ESOPHAGOGASTRODUODENOSCOPY (  EGD) WITH PROPOFOL N/A 03/02/2019   Procedure: ESOPHAGOGASTRODUODENOSCOPY (EGD) WITH PROPOFOL;  Surgeon: Violeta Gelinas, MD;  Location: [facility] ENDOSCOPY;  Service: General;  Laterality: N/A;  . IR GASTROSTOMY TUBE REMOVAL  05/04/2019  . PEG PLACEMENT N/A 03/02/2019   Procedure: PERCUTANEOUS ENDOSCOPIC GASTROSTOMY (PEG) PLACEMENT;  Surgeon: Violeta Gelinas, MD;  Location: [facility] ENDOSCOPY;  Service: General;  Laterality: N/A;  . PERCUTANEOUS TRACHEOSTOMY N/A 02/12/2019   Procedure: PERCUTANEOUS  TRACHEOSTOMY;  Surgeon: Violeta Gelinas, MD;  Location: [facility] OR;  Service: General;  Laterality: N/A;     Medications:   Current Facility-Administered Medications:  .  acetaminophen (TYLENOL) tablet 1,000 mg, 1,000 mg, Oral, Once, Pilar Jarvis, MD .  hydrOXYzine (ATARAX) tablet 10 mg, 10 mg, Oral, Once, Pilar Jarvis, MD .  LORazepam (ATIVAN) tablet 2 mg, 2 mg, Oral, Once, Amariyah Bazar, Elray Buba, NP  Current Outpatient Medications:  .  albuterol (VENTOLIN HFA) 108 (90 Base) MCG/ACT inhaler, Inhale 2 puffs into the lungs every 6 (six) hours as needed for wheezing or shortness of breath., Disp: 18 g, Rfl: 2 .  amLODipine (NORVASC) 5 MG tablet, Take 1 tablet (5 mg total) by mouth daily., Disp: 30 tablet, Rfl: 1 .  apixaban (ELIQUIS) 5 MG TABS tablet, Take 1 tablet (5 mg total) by mouth 2 (two) times daily. (Patient not taking: Reported on 12/03/2022), Disp: 60 tablet, Rfl: 1 .  cefdinir (OMNICEF) 300 MG capsule, Take 1 capsule (300 mg total) by mouth 2 (two) times daily., Disp: 14 capsule, Rfl: 0 .  ibuprofen (ADVIL) 800 MG tablet, Take 1 tablet (800 mg total) by mouth every 8 (eight) hours as needed., Disp: 30 tablet, Rfl: 0 .  ibuprofen (ADVIL) 800 MG tablet, Take 1 tablet (800 mg total) by mouth every 8 (eight) hours as needed., Disp: 30 tablet, Rfl: 0 .  pantoprazole (PROTONIX) 40 MG tablet, Take 1 tablet (40 mg total) by mouth daily., Disp: 90 tablet, Rfl: 3  Allergies: No Known Allergies  Greidy Sherard Damaris Hippo, NP

## 2023-11-29 NOTE — ED Provider Notes (Addendum)
 [facility] Provider Note    Event Date/Time   First MD Initiated Contact with Patient 11/29/23 2257     (approximate)   History   Psychiatric Evaluation   HPI  Jose [name] is a 63 y.o. male   Past medical history of substance use and alcohol use who presents emergency department with multiple complaints.  He is homeless and he did place to stay for tonight.  He drank alcohol earlier.  He is feeling anxious.  He is hungry.  He also states that he has thoughts of hurting other people and when asked to elaborate he shrugs his shoulders and says "I do not know, my friends"  He denies any other acute medical complaints.   External Medical Documents Reviewed: Multiple ED visits from earlier this week with lower back pain      Physical Exam   Triage Vital Signs: ED Triage Vitals [11/29/23 2230]  Encounter Vitals Group     BP (!) 142/95     Systolic BP Percentile      Diastolic BP Percentile      Pulse Rate 93     Resp 16     Temp 98.4 F (36.9 C)     Temp Source Oral     SpO2 96 %     Weight 145 lb (65.8 kg)     Height 5\' 8"  (1.727 m)     Head Circumference      Peak Flow      Pain Score 0     Pain Loc      Pain Education      Exclude from Growth Chart     Most recent vital signs: Vitals:   11/29/23 2230  BP: (!) 142/95  Pulse: 93  Resp: 16  Temp: 98.4 F (36.9 C)  SpO2: 96%    General: Awake, no distress.  CV:  Good peripheral perfusion.  Resp:  Normal effort.  Abd:  No distention.  Other:  Awake alert comfortable, cooperative sitting in stretcher no acute distress eating a sandwich.  Vital signs mildly hypertensive otherwise normal.  No obvious signs of trauma.  Clear lungs to auscultation.   ED Results / Procedures / Treatments   Labs (all labs ordered are listed, but only abnormal results are displayed) Labs Reviewed  COMPREHENSIVE METABOLIC PANEL - Abnormal; Notable for the following components:       Result Value   Sodium 134 (*)    Glucose, Bld 109 (*)    BUN 7 (*)    Creatinine, Ser 0.57 (*)    Total Protein 8.3 (*)    AST 304 (*)    ALT 158 (*)    All other components within normal limits  ETHANOL - Abnormal; Notable for the following components:   Alcohol, Ethyl (B) 178 (*)    All other components within normal limits  CBC - Abnormal; Notable for the following components:   RBC 4.05 (*)    Platelets 94 (*)    All other components within normal limits  URINE DRUG SCREEN, QUALITATIVE ([facility] ONLY) - Abnormal; Notable for the following components:   Cocaine Metabolite,Ur St.  POSITIVE (*)    All other components within normal limits     I ordered and reviewed the above labs they are notable for consistently abnormal liver function labs with 2:1 ratio of AST to ALT, similar to prior, alcohol level in the 170s.   PROCEDURES:  Critical Care performed: No  Procedures  MEDICATIONS ORDERED IN ED: Medications  LORazepam (ATIVAN) tablet 2 mg (has no administration in time range)  acetaminophen (TYLENOL) tablet 1,000 mg (has no administration in time range)  hydrOXYzine (ATARAX) tablet 10 mg (has no administration in time range)     IMPRESSION / MDM / ASSESSMENT AND PLAN / ED COURSE  I reviewed the triage vital signs and the nursing notes.                                Patient's presentation is most consistent with severe exacerbation of chronic illness.  Differential diagnosis includes, but is not limited to, acute decompensated psychiatric illness, substance-induced mood order disorder, homelessness, malingering   MDM:    His chief complaint is that he is homeless and needs a place to stay but also states that he has these vague anxiety/urges to harm other people.  I have him meet with a psychiatrist.  I do not think he needs to be IVC.  He is not markedly intoxicated at this time.  His alcohol level is in the 170s.  I gave him a very small dose of Atarax for his  reported anxiety.   He asked for pain medications.  I told him that I can give him Tylenol but he want something stronger, I told him no narcotics.    He then decided to leave.  He does not appear acutely intoxicated.  He does not meet IVC criteria.  Discharge.     FINAL CLINICAL IMPRESSION(S) / ED DIAGNOSES   Final diagnoses:  Alcoholic intoxication without complication (HCC)  Anxiety     Rx / DC Orders   ED Discharge Orders     None        Note:  This document was prepared using Dragon voice recognition software and may include unintentional dictation errors.    Pilar Jarvis, MD 11/29/23 1610    Pilar Jarvis, MD 11/29/23 2351

## 2023-11-29 NOTE — ED Triage Notes (Signed)
 Pt presents to ER to check his BP reports it was up, pt reports he has been drinking tonight, has thoughts of harming someone, when asked anyone in specific pt reports "some of my friends" pt reports having visual hallucinations, denies auditory hallucinations

## 2023-11-29 NOTE — ED Notes (Signed)
 Pt stated "I need a room or I am want to leave."  MD notified.  RN provided pt discharge papers and instructions.

## 2023-12-02 ENCOUNTER — Emergency Department
Admission: EM | Admit: 2023-12-02 | Discharge: 2023-12-02 | Disposition: A | Discharge status: Home / Self Care | Payer: MEDICAID | Attending: Emergency Medicine | Admitting: Emergency Medicine

## 2023-12-02 ENCOUNTER — Encounter: Payer: Self-pay | Admitting: *Deleted

## 2023-12-02 ENCOUNTER — Other Ambulatory Visit: Payer: Self-pay

## 2023-12-02 DIAGNOSIS — F419 Anxiety disorder, unspecified: Secondary | ICD-10-CM | POA: Diagnosis present

## 2023-12-02 DIAGNOSIS — I10 Essential (primary) hypertension: Secondary | ICD-10-CM | POA: Diagnosis not present

## 2023-12-02 DIAGNOSIS — F1090 Alcohol use, unspecified, uncomplicated: Secondary | ICD-10-CM | POA: Insufficient documentation

## 2023-12-02 DIAGNOSIS — Z789 Other specified health status: Secondary | ICD-10-CM

## 2023-12-02 LAB — CBC
HCT: 37.2 % — ABNORMAL LOW (ref 39.0–52.0)
Hemoglobin: 12.8 g/dL — ABNORMAL LOW (ref 13.0–17.0)
MCH: 33.7 pg (ref 26.0–34.0)
MCHC: 34.4 g/dL (ref 30.0–36.0)
MCV: 97.9 fL (ref 80.0–100.0)
Platelets: 91 10*3/uL — ABNORMAL LOW (ref 150–400)
RBC: 3.8 MIL/uL — ABNORMAL LOW (ref 4.22–5.81)
RDW: 14.4 % (ref 11.5–15.5)
WBC: 5.8 10*3/uL (ref 4.0–10.5)
nRBC: 0 % (ref 0.0–0.2)

## 2023-12-02 LAB — COMPREHENSIVE METABOLIC PANEL
ALT: 150 U/L — ABNORMAL HIGH (ref 0–44)
AST: 304 U/L — ABNORMAL HIGH (ref 15–41)
Albumin: 3.5 g/dL (ref 3.5–5.0)
Alkaline Phosphatase: 77 U/L (ref 38–126)
Anion gap: 11 (ref 5–15)
BUN: 10 mg/dL (ref 8–23)
CO2: 24 mmol/L (ref 22–32)
Calcium: 9.8 mg/dL (ref 8.9–10.3)
Chloride: 99 mmol/L (ref 98–111)
Creatinine, Ser: 0.6 mg/dL — ABNORMAL LOW (ref 0.61–1.24)
GFR, Estimated: >60 mL/min (ref >60)
Glucose, Bld: 106 mg/dL — ABNORMAL HIGH (ref 70–99)
Potassium: 3.8 mmol/L (ref 3.5–5.1)
Sodium: 134 mmol/L — ABNORMAL LOW (ref 135–145)
Total Bilirubin: 1.1 mg/dL (ref 0.0–1.2)
Total Protein: 7.9 g/dL (ref 6.5–8.1)

## 2023-12-02 LAB — ETHANOL: Alcohol, Ethyl (B): 162 mg/dL — ABNORMAL HIGH (ref <10)
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
## 2023-12-02 NOTE — ED Notes (Addendum)
 Patient given sandwich tray and pop to drink.
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
## 2023-12-17 NOTE — Congregational Nurse Program (Signed)
  Dept: [phone]   Congregational Nurse Program Note  Date of Encounter: 12/17/2023 Client to [facility] day center, nurse led clinic with request for foot care. Foot care provided. Wound care also provided to stage II ulcer to his right trochanter. Non-sting Skin barrier film applied to surrounding skin. Wound covered with a hydrocolloid dressing. Client instructed to client to leave the hydrocolloid dressing in place for the next 3 days. Client is currently sleeping on a loading dock on a pallet. Bed roll given for better padding. Client does come regularly to the day center so RN will be able to monitor his wound. [name] BSN, RN Past Medical History: Past Medical History:  Diagnosis Date   Cocaine use    EtOH dependence (HCC)    Hep C w/o coma, chronic (HCC)    Hypertension    Liver cirrhosis, alcoholic (HCC)    Polysubstance abuse (HCC)    Pulmonary embolism [facility])     Encounter Details:  Community Questionnaire - 12/17/23 1155       Questionnaire   Ask client: Do you give verbal consent for me to treat you today? Yes    Student Assistance N/A    Location Patient Served  [facility]    Encounter Setting CN site    Population Status Unhoused    Insurance Medicaid   [place] Access   Insurance/Financial Assistance Referral N/A    Medication Provided Medication Assistance    Medical Provider Yes   client was seen in July 2023, has not rescheduled follow up that was due in October   Screening Referrals Made N/A    Medical Referrals Made N/A    Medical Appointment Completed N/A    CNP Interventions Advocate/Support;Educate    Screenings CN Performed N/A    ED Visit Averted N/A    Life-Saving Intervention Made N/A

## 2023-12-18 NOTE — Congregational Nurse Program (Signed)
  Dept: [phone]   Congregational Nurse Program Note  Date of Encounter: 12/18/2023 Client to [facility] day center, nurse led clinic with need for continued wound care to stage II wound to his right greater trochanter. Hydrocolloid did not stay intact, client removed it. After his shower, here at the center, RN was able to redress the wound with a padded nonadherent island dressing. RN again provided education regarding the need for more cushion in his sleeping area, he reported did not use the sleeping pad given to him yesterday. Support provided. [name] Past Medical History: Past Medical History:  Diagnosis Date   Cocaine use    EtOH dependence (HCC)    Hep C w/o coma, chronic (HCC)    Hypertension    Liver cirrhosis, alcoholic (HCC)    Polysubstance abuse (HCC)    Pulmonary embolism [facility])     Encounter Details:  Community Questionnaire - 12/18/23 1057       Questionnaire   Ask client: Do you give verbal consent for me to treat you today? Yes    Student Assistance N/A    Location Patient Served  [facility]    Encounter Setting CN site    Population Status Unhoused    Insurance Medicaid   [place] Access   Insurance/Financial Assistance Referral N/A    Medication N/A    Medical Provider Yes   client was seen in July 2023, has not rescheduled follow up that was due in October   Screening Referrals Made N/A    Medical Referrals Made N/A    Medical Appointment Completed N/A    CNP Interventions Advocate/Support;Educate    Screenings CN Performed Blood Pressure    ED Visit Averted N/A    Life-Saving Intervention Made N/A

## 2023-12-30 ENCOUNTER — Other Ambulatory Visit: Payer: Self-pay

## 2023-12-30 ENCOUNTER — Emergency Department
Admission: EM | Admit: 2023-12-30 | Discharge: 2023-12-30 | Disposition: A | Discharge status: Home / Self Care | Payer: MEDICAID | Attending: Emergency Medicine | Admitting: Emergency Medicine

## 2023-12-30 DIAGNOSIS — F1910 Other psychoactive substance abuse, uncomplicated: Secondary | ICD-10-CM

## 2023-12-30 DIAGNOSIS — R0789 Other chest pain: Secondary | ICD-10-CM | POA: Diagnosis not present

## 2023-12-30 DIAGNOSIS — Z7901 Long term (current) use of anticoagulants: Secondary | ICD-10-CM | POA: Diagnosis not present

## 2023-12-30 DIAGNOSIS — I10 Essential (primary) hypertension: Secondary | ICD-10-CM | POA: Diagnosis not present

## 2023-12-30 DIAGNOSIS — F1410 Cocaine abuse, uncomplicated: Secondary | ICD-10-CM | POA: Insufficient documentation

## 2023-12-30 DIAGNOSIS — R7401 Elevation of levels of liver transaminase levels: Secondary | ICD-10-CM | POA: Insufficient documentation

## 2023-12-30 DIAGNOSIS — R079 Chest pain, unspecified: Secondary | ICD-10-CM | POA: Diagnosis present

## 2023-12-30 LAB — CBC WITH DIFFERENTIAL/PLATELET
Abs Immature Granulocytes: 0.02 10*3/uL (ref 0.00–0.07)
Basophils Absolute: 0 10*3/uL (ref 0.0–0.1)
Basophils Relative: 0 %
Eosinophils Absolute: 0.1 10*3/uL (ref 0.0–0.5)
Eosinophils Relative: 1 %
HCT: 38.1 % — ABNORMAL LOW (ref 39.0–52.0)
Hemoglobin: 13.4 g/dL (ref 13.0–17.0)
Immature Granulocytes: 0 %
Lymphocytes Relative: 37 %
Lymphs Abs: 1.9 10*3/uL (ref 0.7–4.0)
MCH: 33.7 pg (ref 26.0–34.0)
MCHC: 35.2 g/dL (ref 30.0–36.0)
MCV: 95.7 fL (ref 80.0–100.0)
Monocytes Absolute: 0.5 10*3/uL (ref 0.1–1.0)
Monocytes Relative: 10 %
Neutro Abs: 2.7 10*3/uL (ref 1.7–7.7)
Neutrophils Relative %: 52 %
Platelets: 104 10*3/uL — ABNORMAL LOW (ref 150–400)
RBC: 3.98 MIL/uL — ABNORMAL LOW (ref 4.22–5.81)
RDW: 14.1 % (ref 11.5–15.5)
WBC: 5.2 10*3/uL (ref 4.0–10.5)
nRBC: 0 % (ref 0.0–0.2)

## 2023-12-30 LAB — COMPREHENSIVE METABOLIC PANEL WITH GFR
ALT: 88 U/L — ABNORMAL HIGH (ref 0–44)
AST: 166 U/L — ABNORMAL HIGH (ref 15–41)
Albumin: 3.6 g/dL (ref 3.5–5.0)
Alkaline Phosphatase: 76 U/L (ref 38–126)
Anion gap: 10 (ref 5–15)
BUN: 11 mg/dL (ref 8–23)
CO2: 20 mmol/L — ABNORMAL LOW (ref 22–32)
Calcium: 9.1 mg/dL (ref 8.9–10.3)
Chloride: 108 mmol/L (ref 98–111)
Creatinine, Ser: 0.61 mg/dL (ref 0.61–1.24)
GFR, Estimated: >60 mL/min (ref >60)
Glucose, Bld: 101 mg/dL — ABNORMAL HIGH (ref 70–99)
Potassium: 3.4 mmol/L — ABNORMAL LOW (ref 3.5–5.1)
Sodium: 138 mmol/L (ref 135–145)
Total Bilirubin: 0.9 mg/dL (ref 0.0–1.2)
Total Protein: 8.2 g/dL — ABNORMAL HIGH (ref 6.5–8.1)

## 2023-12-30 LAB — URINE DRUG SCREEN, QUALITATIVE (ARMC ONLY)
Amphetamines, Ur Screen: NOT DETECTED
Barbiturates, Ur Screen: NOT DETECTED
Benzodiazepine, Ur Scrn: NOT DETECTED
Cannabinoid 50 Ng, Ur ~~LOC~~: NOT DETECTED
Cocaine Metabolite,Ur ~~LOC~~: POSITIVE — AB
MDMA (Ecstasy)Ur Screen: NOT DETECTED
Methadone Scn, Ur: NOT DETECTED
Opiate, Ur Screen: NOT DETECTED
Phencyclidine (PCP) Ur S: NOT DETECTED
Tricyclic, Ur Screen: NOT DETECTED

## 2023-12-30 LAB — URINALYSIS, ROUTINE W REFLEX MICROSCOPIC
Bilirubin Urine: NEGATIVE
Glucose, UA: NEGATIVE mg/dL
Hgb urine dipstick: NEGATIVE
Ketones, ur: NEGATIVE mg/dL
Leukocytes,Ua: NEGATIVE
Nitrite: NEGATIVE
Protein, ur: NEGATIVE mg/dL
Specific Gravity, Urine: 1.004 — ABNORMAL LOW (ref 1.005–1.030)
pH: 5 (ref 5.0–8.0)

## 2023-12-30 LAB — TROPONIN I (HIGH SENSITIVITY)
Troponin I (High Sensitivity): 10 ng/L (ref <18)
Troponin I (High Sensitivity): 10 ng/L (ref <18)

## 2023-12-30 LAB — ETHANOL: Alcohol, Ethyl (B): 290 mg/dL — ABNORMAL HIGH (ref <10)

## 2023-12-30 NOTE — ED Notes (Signed)
 Pt provided a box meal and water

## 2023-12-30 NOTE — ED Provider Notes (Signed)
 [facility] Provider Note    Event Date/Time   First MD Initiated Contact with Patient 12/30/23 0128     (approximate)   History   Chest Pain and Alcohol Intoxication   HPI  Jose [name] is a 63 y.o. male with history of alcohol and cocaine use disorder, alcoholic cirrhosis, hepatitis C, PE with noncompliance with Eliquis who presents to the emergency department with chest pain.  Reports he was drinking alcohol and using cocaine today.  No fevers or cough.  No shortness of breath.  No vomiting or diarrhea.  Well-known to our emergency department with frequent visits for the same.   History provided by patient.    Past Medical History:  Diagnosis Date   Cocaine use    EtOH dependence (HCC)    Hep C w/o coma, chronic (HCC)    Hypertension    Liver cirrhosis, alcoholic (HCC)    Polysubstance abuse (HCC)    Pulmonary embolism (HCC)     Past Surgical History:  Procedure Laterality Date   ESOPHAGOGASTRODUODENOSCOPY (EGD) WITH PROPOFOL N/A 03/02/2019   Procedure: ESOPHAGOGASTRODUODENOSCOPY (EGD) WITH PROPOFOL;  Surgeon: Violeta Gelinas, MD;  Location: [facility] ENDOSCOPY;  Service: General;  Laterality: N/A;   IR GASTROSTOMY TUBE REMOVAL  05/04/2019   PEG PLACEMENT N/A 03/02/2019   Procedure: PERCUTANEOUS ENDOSCOPIC GASTROSTOMY (PEG) PLACEMENT;  Surgeon: Violeta Gelinas, MD;  Location: [facility] ENDOSCOPY;  Service: General;  Laterality: N/A;   PERCUTANEOUS TRACHEOSTOMY N/A 02/12/2019   Procedure: PERCUTANEOUS TRACHEOSTOMY;  Surgeon: Violeta Gelinas, MD;  Location: [facility] OR;  Service: General;  Laterality: N/A;    MEDICATIONS:  Prior to Admission medications   Medication Sig Start Date End Date Taking? Authorizing Provider  albuterol (VENTOLIN HFA) 108 (90 Base) MCG/ACT inhaler Inhale 2 puffs into the lungs every 6 (six) hours as needed for wheezing or shortness of breath. 10/14/23   [name], PA-C  amLODipine (NORVASC) 5 MG tablet Take 1 tablet (5 mg total)  by mouth daily. 11/26/21 12/30/21  Ameah Chanda, Layla Maw, DO  apixaban (ELIQUIS) 5 MG TABS tablet Take 1 tablet (5 mg total) by mouth 2 (two) times daily. Patient not taking: Reported on 12/03/2022 11/26/21   Myka Lukins, Layla Maw, DO  cefdinir (OMNICEF) 300 MG capsule Take 1 capsule (300 mg total) by mouth 2 (two) times daily. 11/20/23   Aime Carreras, Layla Maw, DO  ibuprofen (ADVIL) 800 MG tablet Take 1 tablet (800 mg total) by mouth every 8 (eight) hours as needed. 11/20/23   Namari Breton, Layla Maw, DO  ibuprofen (ADVIL) 800 MG tablet Take 1 tablet (800 mg total) by mouth every 8 (eight) hours as needed. 11/20/23   Johnesha Acheampong, Layla Maw, DO  pantoprazole (PROTONIX) 40 MG tablet Take 1 tablet (40 mg total) by mouth daily. 01/16/23 01/16/24  Pilar Jarvis, MD    Physical Exam   Triage Vital Signs: ED Triage Vitals  Encounter Vitals Group     BP 12/30/23 0026 135/87     Systolic BP Percentile --      Diastolic BP Percentile --      Pulse Rate 12/30/23 0026 88     Resp 12/30/23 0026 16     Temp 12/30/23 0026 98.2 F (36.8 C)     Temp Source 12/30/23 0026 Oral     SpO2 12/30/23 0026 98 %     Weight 12/30/23 0022 143 lb 4.8 oz (65 kg)     Height 12/30/23 0022 5\' 6"  (1.676 m)     Head Circumference --  Peak Flow --      Pain Score 12/30/23 0021 0     Pain Loc --      Pain Education --      Exclude from Growth Chart --     Most recent vital signs: Vitals:   12/30/23 0026 12/30/23 0440  BP: 135/87 (!) 167/91  Pulse: 88 79  Resp: 16 18  Temp: 98.2 F (36.8 C) 98.2 F (36.8 C)  SpO2: 98% 98%    CONSTITUTIONAL: Alert, responds appropriately to questions.  Chronically ill-appearing, appears intoxicated, slurred speech, smells of alcohol HEAD: Normocephalic, atraumatic EYES: Conjunctivae clear, pupils appear equal, sclera nonicteric ENT: normal nose; moist mucous membranes NECK: Supple, normal ROM CARD: RRR; S1 and S2 appreciated RESP: Normal chest excursion without splinting or tachypnea; breath sounds clear and equal  bilaterally; no wheezes, no rhonchi, no rales, no hypoxia or respiratory distress, speaking full sentences ABD/GI: Non-distended; soft, non-tender, no rebound, no guarding, no peritoneal signs BACK: The back appears normal EXT: Normal ROM in all joints; no deformity noted, no edema, no calf tenderness or calf swelling SKIN: Normal color for age and race; warm; no rash on exposed skin NEURO: Moves all extremities equally, no facial asymmetry, normal gait PSYCH: The patient's mood and manner are appropriate.   ED Results / Procedures / Treatments   LABS: (all labs ordered are listed, but only abnormal results are displayed) Labs Reviewed  COMPREHENSIVE METABOLIC PANEL WITH GFR - Abnormal; Notable for the following components:      Result Value   Potassium 3.4 (*)    CO2 20 (*)    Glucose, Bld 101 (*)    Total Protein 8.2 (*)    AST 166 (*)    ALT 88 (*)    All other components within normal limits  ETHANOL - Abnormal; Notable for the following components:   Alcohol, Ethyl (B) 290 (*)    All other components within normal limits  URINE DRUG SCREEN, QUALITATIVE ([facility] ONLY) - Abnormal; Notable for the following components:   Cocaine Metabolite,[place] POSITIVE (*)    All other components within normal limits  CBC WITH DIFFERENTIAL/PLATELET - Abnormal; Notable for the following components:   RBC 3.98 (*)    HCT 38.1 (*)    Platelets 104 (*)    All other components within normal limits  URINALYSIS, ROUTINE W REFLEX MICROSCOPIC - Abnormal; Notable for the following components:   Color, Urine STRAW (*)    APPearance CLEAR (*)    Specific Gravity, Urine 1.004 (*)    All other components within normal limits  TROPONIN I (HIGH SENSITIVITY)  TROPONIN I (HIGH SENSITIVITY)     EKG:  EKG Interpretation Date/Time:  Monday December 30 2023 00:24:29 EDT Ventricular Rate:  87 PR Interval:  168 QRS Duration:  142 QT Interval:  416 QTC Calculation: 500 R Axis:   76  Text  Interpretation: Normal sinus rhythm Right bundle branch block Abnormal ECG When compared with ECG of 25-Nov-2023 04:33, No significant change was found Confirmed by Rochele Raring [id number]) on 12/30/2023 1:29:33 AM         RADIOLOGY: My personal review and interpretation of imaging:   I have personally reviewed all radiology reports.   No results found.   PROCEDURES:  Critical Care performed: No      Procedures    IMPRESSION / MDM / ASSESSMENT AND PLAN / ED COURSE  I reviewed the triage vital signs and the nursing notes.    Patient here  with chest pain, intoxication.    DIFFERENTIAL DIAGNOSIS (includes but not limited to):   Chest wall pain, vasospasm secondary to cocaine use, ACS, PE, dissection, CHF, pneumonia, pneumothorax, intoxication, malingering, homelessness   Patient's presentation is most consistent with acute presentation with potential threat to life or bodily function.   PLAN: Patient here with chest pain.  Well-known to our emergency department.  Patient is intoxicated.  Denies symptoms currently.  EKG nonischemic.  Will obtain cardiac labs.  Low suspicion for PE given no tachycardia, tachypnea or hypoxia.  Last CT scan on 11/20/2023 showed no PE.  Low suspicion for pneumothorax, CHF, pneumonia.  He has had multiple negative chest x-rays.  I do not feel he needs repeat chest imaging today.   MEDICATIONS GIVEN IN ED: Medications - No data to display   ED COURSE: Labs unremarkable.  Troponin x 2 negative.  Chronically elevated AST and ALT consistent with alcohol use.  Patient is also thrombocytopenic which is chronic for him.  Drug screen positive for cocaine.  Patient resting comfortably without complaints.  Ambulating without difficulty.  Tolerating p.o. here.  I feel he is safe for discharge home in the morning with sober ride versus bus/cab.  At this time, I do not feel there is any life-threatening condition present. I reviewed all nursing notes, vitals,  pertinent previous records.  All lab and urine results, EKGs, imaging ordered have been independently reviewed and interpreted by myself.  I reviewed all available radiology reports from any imaging ordered this visit.  Based on my assessment, I feel the patient is safe to be discharged home without further emergent workup and can continue workup as an outpatient as needed. Discussed all findings, treatment plan as well as usual and customary return precautions.  They verbalize understanding and are comfortable with this plan.  Outpatient follow-up has been provided as needed.  All questions have been answered.    CONSULTS:  none   OUTSIDE RECORDS REVIEWED: Reviewed last psychiatric notes in March 2024.       FINAL CLINICAL IMPRESSION(S) / ED DIAGNOSES   Final diagnoses:  Polysubstance abuse (HCC)  Atypical chest pain     Rx / DC Orders   ED Discharge Orders     None        Note:  This document was prepared using Dragon voice recognition software and may include unintentional dictation errors.   Chryl Holten, Layla Maw, DO 12/30/23 [phone]

## 2023-12-30 NOTE — ED Notes (Signed)
 Pt ambulated to the restroom and back by himself without incident

## 2023-12-30 NOTE — ED Triage Notes (Signed)
 [facility] for CP. Pt is obviously intoxicated and staff is familiar with the pt.   325mg  ASA 147/68 98% RA  88 HR  EKG NS

## 2023-12-30 NOTE — ED Notes (Signed)
 Pt with slurred speech. States that he has chest pain x "a couple of days". States that his legs feel numb. States that he drinks alcohol. States "my liver is shot".

## 2023-12-30 NOTE — ED Notes (Signed)
 Pt was asked not to get up and walk around the lobby. Pt proceeds to get up and walk around the lobby.

## 2023-12-31 NOTE — Congregational Nurse Program (Signed)
  Dept: [phone]   Congregational Nurse Program Note  Date of Encounter: 12/31/2023 Client to [facility] day cneter with request for foot care. Client presents with a quarter size intact fluid filled blister to medial portion of his left arch. Liquid bandage applied, area covered with a nonadherent dressing. No other needs at this time. Client seen at the [facility] ED over the weekend. No changes in medications or new medications noted. [name] Past Medical History: Past Medical History:  Diagnosis Date   Cocaine use    EtOH dependence (HCC)    Hep C w/o coma, chronic (HCC)    Hypertension    Liver cirrhosis, alcoholic (HCC)    Polysubstance abuse (HCC)    Pulmonary embolism [facility])     Encounter Details:  Community Questionnaire - 12/31/23 1318       Questionnaire   Ask client: Do you give verbal consent for me to treat you today? Yes    Student Assistance N/A    Location Patient Served  [facility]    Encounter Setting CN site    Population Status Unhoused    Insurance Medicaid   [place] Access   Insurance/Financial Assistance Referral N/A    Medication N/A    Medical Provider Yes   client was seen in July 2023, has not rescheduled follow up that was due in October   Screening Referrals Made N/A    Medical Referrals Made N/A    Medical Appointment Completed N/A    CNP Interventions Advocate/Support;Educate    Screenings CN Performed N/A    ED Visit Averted N/A    Life-Saving Intervention Made N/A

## 2024-01-01 NOTE — Congregational Nurse Program (Signed)
  Dept: [phone]   Congregational Nurse Program Note  Date of Encounter: 01/01/2024 Client to [facility] day center, nurse led clinic for wound dressing change. Wound to right greater trochanter healing well. No drainage, wound appears as abrasion at this time. Wound covered with soft, nonadherent dressing. Nonadherent dressing changed to intact blister on medial aspect of his left foot. Area remains painful. Client now has correct fitting shoes and socks.Client given a protein drink. [name] BSN, RN Past Medical History: Past Medical History:  Diagnosis Date   Cocaine use    EtOH dependence (HCC)    Hep C w/o coma, chronic (HCC)    Hypertension    Liver cirrhosis, alcoholic (HCC)    Polysubstance abuse (HCC)    Pulmonary embolism [facility])     Encounter Details:  Community Questionnaire - 01/01/24 1130       Questionnaire   Ask client: Do you give verbal consent for me to treat you today? Yes    Student Assistance N/A    Location Patient Served  [facility]    Encounter Setting CN site    Population Status Unhoused    Insurance Medicaid   [place] Access   Insurance/Financial Assistance Referral N/A    Medication N/A    Medical Provider No   client was seen in July 2023, has not rescheduled follow up that was due in October   Screening Referrals Made N/A    Medical Referrals Made N/A    Medical Appointment Completed N/A    CNP Interventions Advocate/Support;Educate    Screenings CN Performed N/A    ED Visit Averted N/A    Life-Saving Intervention Made N/A

## 2024-01-02 ENCOUNTER — Other Ambulatory Visit: Payer: Self-pay

## 2024-01-02 ENCOUNTER — Emergency Department: Payer: MEDICAID

## 2024-01-02 DIAGNOSIS — R079 Chest pain, unspecified: Secondary | ICD-10-CM | POA: Insufficient documentation

## 2024-01-02 DIAGNOSIS — F10120 Alcohol abuse with intoxication, uncomplicated: Secondary | ICD-10-CM | POA: Diagnosis not present

## 2024-01-02 LAB — BASIC METABOLIC PANEL WITH GFR
Anion gap: 9 (ref 5–15)
BUN: 10 mg/dL (ref 8–23)
CO2: 23 mmol/L (ref 22–32)
Calcium: 9.3 mg/dL (ref 8.9–10.3)
Chloride: 97 mmol/L — ABNORMAL LOW (ref 98–111)
Creatinine, Ser: 0.68 mg/dL (ref 0.61–1.24)
GFR, Estimated: >60 mL/min
Glucose, Bld: 114 mg/dL — ABNORMAL HIGH (ref 70–99)
Potassium: 3.3 mmol/L — ABNORMAL LOW (ref 3.5–5.1)
Sodium: 129 mmol/L — ABNORMAL LOW (ref 135–145)

## 2024-01-02 LAB — CBC
HCT: 37 % — ABNORMAL LOW (ref 39.0–52.0)
Hemoglobin: 12.7 g/dL — ABNORMAL LOW (ref 13.0–17.0)
MCH: 34 pg (ref 26.0–34.0)
MCHC: 34.3 g/dL (ref 30.0–36.0)
MCV: 99.2 fL (ref 80.0–100.0)
Platelets: 82 K/uL — ABNORMAL LOW (ref 150–400)
RBC: 3.73 MIL/uL — ABNORMAL LOW (ref 4.22–5.81)
RDW: 13.4 % (ref 11.5–15.5)
WBC: 6 K/uL (ref 4.0–10.5)
nRBC: 0 % (ref 0.0–0.2)

## 2024-01-02 LAB — TROPONIN I (HIGH SENSITIVITY): Troponin I (High Sensitivity): 9 ng/L (ref <18)

## 2024-01-02 NOTE — ED Triage Notes (Signed)
 Pt to ED via EMS from home, pt reports using cocaine and alcohol tonight and then began to have chest pain. Pt refused 12 lead with EMS.

## 2024-01-02 NOTE — Congregational Nurse Program (Signed)
  Dept: [phone]   Congregational Nurse Program Note  Date of Encounter: 01/02/2024 Client to [facility] day center, nurse led clinic for continues wound monitoring and dressing changes. Nonadherent dressing changed to the medial aspect of his left foot. Nonadherent dressing intact to right hip. RN able to assess right hip healing Stage II and replace current dressing. Bed Client is currently sleeping on a loading dock on a pallet. He has a bed roll that provides some cushion. RN has offered a blanket, or sleeping bag, client has declined. RN to continue to provide emotional support and wound care. [name] BSN, RN Past Medical History: Past Medical History:  Diagnosis Date   Cocaine use    EtOH dependence (HCC)    Hep C w/o coma, chronic (HCC)    Hypertension    Liver cirrhosis, alcoholic (HCC)    Polysubstance abuse (HCC)    Pulmonary embolism (HCC)     Encounter Details:  Community Questionnaire - 01/02/24 1200       Questionnaire   Ask client: Do you give verbal consent for me to treat you today? Yes    Student Assistance N/A    Location Patient Served  [id number]    Encounter Setting CN site    Population Status Unhoused    Insurance Medicaid   [place] Access   Insurance/Financial Assistance Referral N/A    Medication N/A    Medical Provider No   client was seen in July 2023, has not rescheduled follow up that was due in October   Screening Referrals Made N/A    Medical Referrals Made N/A    Medical Appointment Completed N/A    CNP Interventions Advocate/Support;Educate    Screenings CN Performed N/A    ED Visit Averted N/A    Life-Saving Intervention Made N/A

## 2024-01-03 ENCOUNTER — Emergency Department
Admission: EM | Admit: 2024-01-03 | Discharge: 2024-01-03 | Disposition: A | Discharge status: Home / Self Care | Payer: MEDICAID | Attending: Emergency Medicine | Admitting: Emergency Medicine

## 2024-01-03 DIAGNOSIS — R079 Chest pain, unspecified: Secondary | ICD-10-CM

## 2024-01-03 DIAGNOSIS — F10920 Alcohol use, unspecified with intoxication, uncomplicated: Secondary | ICD-10-CM

## 2024-01-03 LAB — D-DIMER, QUANTITATIVE: D-Dimer, Quant: 0.48 ug{FEU}/mL (ref 0.00–0.50)

## 2024-01-03 LAB — TROPONIN I (HIGH SENSITIVITY): Troponin I (High Sensitivity): 10 ng/L (ref <18)

## 2024-01-03 MED ORDER — IBUPROFEN 400 MG PO TABS
400.0000 mg | ORAL_TABLET | Freq: Once | ORAL | Status: AC
  Administered 2024-01-03: 400 mg via ORAL
  Filled 2024-01-03: qty 1

## 2024-01-03 NOTE — ED Provider Notes (Signed)
 [facility] Provider Note    Event Date/Time   First MD Initiated Contact with Patient 01/03/24 0040     (approximate)   History   Chief Complaint Chest Pain   HPI  Jose [name] is a 63 y.o. male with past medical history of hypertension, pulmonary embolism, hepatitis C, and alcohol abuse who presents to the ED complaining of chest pain.  Patient reports that he has been having chest pain this evening which she describes as sharp and severe, located in the center of his chest.  He reports some difficulty breathing, denies any fevers or cough.  He has not had any pain or swelling in his legs, but does state he has not been taking Eliquis because he is having trouble affording it.  He admits to alcohol consumption tonight as well as cocaine use yesterday evening.     Physical Exam   Triage Vital Signs: ED Triage Vitals  Encounter Vitals Group     BP 01/02/24 2251 (!) 157/112     Systolic BP Percentile --      Diastolic BP Percentile --      Pulse Rate 01/02/24 2251 85     Resp 01/02/24 2257 16     Temp 01/02/24 2251 98.7 F (37.1 C)     Temp Source 01/02/24 2251 Oral     SpO2 01/02/24 2251 96 %     Weight 01/02/24 2250 143 lb 4.8 oz (65 kg)     Height 01/02/24 2250 5\' 6"  (1.676 m)     Head Circumference --      Peak Flow --      Pain Score 01/02/24 2250 10     Pain Loc --      Pain Education --      Exclude from Growth Chart --     Most recent vital signs: Vitals:   01/02/24 2251 01/02/24 2257  BP: (!) 157/112   Pulse: 85   Resp:  16  Temp: 98.7 F (37.1 C)   SpO2: 96%     Constitutional: Alert and oriented. Eyes: Conjunctivae are normal. Head: Atraumatic. Nose: No congestion/rhinnorhea. Mouth/Throat: Mucous membranes are moist.  Cardiovascular: Normal rate, regular rhythm. Grossly normal heart sounds.  2+ radial pulses bilaterally. Respiratory: Normal respiratory effort.  No retractions. Lungs CTAB. Gastrointestinal: Soft  and nontender. No distention. Musculoskeletal: No lower extremity tenderness nor edema.  Neurologic:  Normal speech and language. No gross focal neurologic deficits are appreciated.    ED Results / Procedures / Treatments   Labs (all labs ordered are listed, but only abnormal results are displayed) Labs Reviewed  BASIC METABOLIC PANEL WITH GFR - Abnormal; Notable for the following components:      Result Value   Sodium 129 (*)    Potassium 3.3 (*)    Chloride 97 (*)    Glucose, Bld 114 (*)    All other components within normal limits  CBC - Abnormal; Notable for the following components:   RBC 3.73 (*)    Hemoglobin 12.7 (*)    HCT 37.0 (*)    Platelets 82 (*)    All other components within normal limits  D-DIMER, QUANTITATIVE  TROPONIN I (HIGH SENSITIVITY)  TROPONIN I (HIGH SENSITIVITY)     EKG  ED ECG REPORT I, Chesley Noon, the attending physician, personally viewed and interpreted this ECG.   Date: 01/03/2024  EKG Time: 22:53  Rate: 81  Rhythm: normal sinus rhythm  Axis: Normal  Intervals:right  bundle branch block  ST&T Change: None  RADIOLOGY Chest x-ray reviewed and interpreted by me with no infiltrate, edema, or effusion.  PROCEDURES:  Critical Care performed: No  Procedures   MEDICATIONS ORDERED IN ED: Medications  ibuprofen (ADVIL) tablet 400 mg (has no administration in time range)     IMPRESSION / MDM / ASSESSMENT AND PLAN / ED COURSE  I reviewed the triage vital signs and the nursing notes.                              63 y.o. male with past medical history of hypertension, pulmonary embolism, hepatitis C, and alcohol abuse who presents to the ED complaining of chest pain starting earlier this evening associated with some difficulty breathing.  Patient's presentation is most consistent with acute presentation with potential threat to life or bodily function.  Differential diagnosis includes, but is not limited to, ACS, PE, pneumonia,  pneumothorax, musculoskeletal pain, GERD, anxiety, alcoholic gastritis.  Patient nontoxic-appearing and in no acute distress, vital signs are remarkable for hypertension but otherwise reassuring.  EKG shows no evidence of arrhythmia or ischemia and initial troponin within normal limits.  We will check D-dimer to rule out pulmonary embolism, chest x-ray is unremarkable.  Remainder of labs show mild hyponatremia, likely due to alcohol consumption, no significant anemia, leukocytosis, or AKI noted.  D-dimer within normal limits, repeat troponin is also reassuring.  Patient clinically sober on reassessment and is appropriate for discharge home with outpatient follow-up.  He was counseled to return to the ED for new or worsening symptoms, patient agrees with plan.      FINAL CLINICAL IMPRESSION(S) / ED DIAGNOSES   Final diagnoses:  Nonspecific chest pain  Alcoholic intoxication without complication (HCC)     Rx / DC Orders   ED Discharge Orders     None        Note:  This document was prepared using Dragon voice recognition software and may include unintentional dictation errors.   Chesley Noon, MD 01/03/24 [phone]

## 2024-01-08 NOTE — Congregational Nurse Program (Signed)
  Dept: [phone]   Congregational Nurse Program Note  Date of Encounter: 01/08/2024 Client to [facility] day center nurse led clinic with request for foot care and continued wound assessment. Healing stage II to Right trochanter dressed with nonadherent dressing and secured with paper tape. Blister to left foot has drained and skin has toughened. Liquid bandage applied. Toe nails trimmed. No other needs at this time. Arleen Lacer Past Medical History: Past Medical History:  Diagnosis Date   Cocaine use    EtOH dependence (HCC)    Hep C w/o coma, chronic (HCC)    Hypertension    Liver cirrhosis, alcoholic (HCC)    Polysubstance abuse (HCC)    Pulmonary embolism [facility])     Encounter Details:  Community Questionnaire - 01/08/24 1126       Questionnaire   Ask client: Do you give verbal consent for me to treat you today? Yes    Student Assistance N/A    Location Patient Served  [facility]    Encounter Setting CN site    Population Status Unhoused    Insurance Medicaid   [place] Access   Insurance/Financial Assistance Referral N/A    Medication N/A    Medical Provider No   client was seen in July 2023, has not rescheduled follow up that was due in October   Screening Referrals Made N/A    Medical Referrals Made N/A    Medical Appointment Completed N/A    CNP Interventions Advocate/Support;Educate    Screenings CN Performed N/A    ED Visit Averted N/A    Life-Saving Intervention Made N/A

## 2024-01-14 NOTE — Congregational Nurse Program (Signed)
  Dept: [phone]   Congregational Nurse Program Note  Date of Encounter: 01/14/2024 Client to [facility] day center for dressing change to right greater trochanter. Healing stage II, no longer open, small scab in place. Covered with nonadherent dressing for protection as client continues to sleep on a hard surface outdoors. Dressing also changed to open area on his outer left calf. Cleint reports that he was in the shower yesterday and scratched the "white raised spot" on his outer left calf and it proceeded to bleed heavily. Area appears as a red divit, no further bleeding. Nonadherent dressing applied and secured with stretch wrap and paper tape. Juvenal Opoka BSN, RN Past Medical History: Past Medical History:  Diagnosis Date   Cocaine use    EtOH dependence (HCC)    Hep C w/o coma, chronic (HCC)    Hypertension    Liver cirrhosis, alcoholic (HCC)    Polysubstance abuse (HCC)    Pulmonary embolism [facility])     Encounter Details:  Community Questionnaire - 01/14/24 1343       Questionnaire   Ask client: Do you give verbal consent for me to treat you today? Yes    Student Assistance N/A    Location Patient Served  [facility]    Encounter Setting CN site    Population Status Unhoused    Insurance Medicaid   [place] Access   Insurance/Financial Assistance Referral N/A    Medication N/A    Medical Provider No   client was seen in July 2023, has not rescheduled follow up that was due in October   Screening Referrals Made N/A    Medical Referrals Made N/A    Medical Appointment Completed N/A    CNP Interventions Advocate/Support;Educate    Screenings CN Performed N/A    ED Visit Averted N/A    Life-Saving Intervention Made N/A

## 2024-01-16 NOTE — Congregational Nurse Program (Signed)
  Dept: [phone]   Congregational Nurse Program Note  Date of Encounter: 01/15/2024 Client to [facility] day center for daily dressing change to healing stage II to right greater trochanter. Area appears well healed, moisturizing lotion applied to area. Foot care also provided. Juvenal Opoka BSN, RN Past Medical History: Past Medical History:  Diagnosis Date   Cocaine use    EtOH dependence (HCC)    Hep C w/o coma, chronic (HCC)    Hypertension    Liver cirrhosis, alcoholic (HCC)    Polysubstance abuse (HCC)    Pulmonary embolism [facility])     Encounter Details:  Community Questionnaire - 01/15/24 1205       Questionnaire   Ask client: Do you give verbal consent for me to treat you today? Yes    Student Assistance N/A    Location Patient Served  [facility]    Encounter Setting CN site    Population Status Unhoused    Insurance Medicaid   [place] Access   Insurance/Financial Assistance Referral N/A    Medication N/A    Medical Provider No   client was seen in July 2023, has not rescheduled follow up that was due in October   Screening Referrals Made N/A    Medical Referrals Made N/A    Medical Appointment Completed N/A    CNP Interventions Advocate/Support;Educate    Screenings CN Performed N/A    ED Visit Averted N/A    Life-Saving Intervention Made N/A

## 2024-01-22 NOTE — Congregational Nurse Program (Signed)
  Dept: [phone]   Congregational Nurse Program Note  Date of Encounter: 01/22/2024 Client to [facility] day center for blood pressure check and emotional support. BP 110/68 (BP Location: Left Arm, Patient Position: Sitting, Cuff Size: Normal)   Pulse 98   SpO2 97% . Client given a protein drink and emotional support. Juvenal Opoka BSN, RN  Past Medical History: Past Medical History:  Diagnosis Date   Cocaine use    EtOH dependence (HCC)    Hep C w/o coma, chronic (HCC)    Hypertension    Liver cirrhosis, alcoholic (HCC)    Polysubstance abuse (HCC)    Pulmonary embolism [facility])     Encounter Details:  Community Questionnaire - 01/22/24 0945       Questionnaire   Ask client: Do you give verbal consent for me to treat you today? Yes    Student Assistance N/A    Location Patient Served  [facility]    Encounter Setting CN site    Population Status Unhoused    Insurance Medicaid   [place] Access   Insurance/Financial Assistance Referral N/A    Medication N/A    Medical Provider No   client was seen in July 2023, has not rescheduled follow up that was due in October   Screening Referrals Made N/A    Medical Referrals Made N/A    Medical Appointment Completed N/A    CNP Interventions Advocate/Support;Educate    Screenings CN Performed Blood Pressure    ED Visit Averted N/A    Life-Saving Intervention Made N/A

## 2024-01-29 NOTE — Congregational Nurse Program (Signed)
  Dept: [phone]   Congregational Nurse Program Note  Date of Encounter: 01/29/2024 Client to [facility] day center with request for foot care. Foot care completed. Feet with several areas of thick dead skin. Gently removed with foot file. Feet then moisturized. No other needs at this time. Juvenal Opoka BSN. Past Medical History: Past Medical History:  Diagnosis Date   Cocaine use    EtOH dependence (HCC)    Hep C w/o coma, chronic (HCC)    Hypertension    Liver cirrhosis, alcoholic (HCC)    Polysubstance abuse (HCC)    Pulmonary embolism [facility])     Encounter Details:  Community Questionnaire - 01/29/24 1256       Questionnaire   Ask client: Do you give verbal consent for me to treat you today? Yes    Student Assistance N/A    Location Patient Served  [facility]    Encounter Setting CN site    Population Status Unhoused    Insurance Medicaid   [place] Access   Insurance/Financial Assistance Referral N/A    Medication N/A    Medical Provider No   client was seen in July 2023, has not rescheduled follow up that was due in October   Screening Referrals Made N/A    Medical Referrals Made N/A    Medical Appointment Completed N/A    CNP Interventions Advocate/Support;Educate    Screenings CN Performed N/A    ED Visit Averted N/A    Life-Saving Intervention Made N/A

## 2024-02-05 NOTE — Congregational Nurse Program (Signed)
  Dept: [phone]   Congregational Nurse Program Note  Date of Encounter: 02/05/2024 Client to [name] day center, nurse led clinic with request for foot care. Foot care provided. Juvenal Opoka BSN, RN Past Medical History: Past Medical History:  Diagnosis Date   Cocaine use    EtOH dependence (HCC)    Hep C w/o coma, chronic (HCC)    Hypertension    Liver cirrhosis, alcoholic (HCC)    Polysubstance abuse (HCC)    Pulmonary embolism (HCC)     Encounter Details:  Community Questionnaire - 02/05/24 1200       Questionnaire   Ask client: Do you give verbal consent for me to treat you today? Yes    Student Assistance N/A    Location Patient Served  [facility]    Encounter Setting CN site    Population Status Unhoused    Insurance Medicaid   [place] Access   Insurance/Financial Assistance Referral N/A    Medication N/A    Medical Provider No   client was seen in July 2023, has not rescheduled follow up that was due in October   Screening Referrals Made N/A    Medical Referrals Made N/A    Medical Appointment Completed N/A    CNP Interventions Advocate/Support;Educate    Screenings CN Performed N/A    ED Visit Averted N/A    Life-Saving Intervention Made N/A

## 2024-02-07 ENCOUNTER — Other Ambulatory Visit: Payer: Self-pay

## 2024-02-07 ENCOUNTER — Encounter: Payer: Self-pay | Admitting: Emergency Medicine

## 2024-02-07 ENCOUNTER — Emergency Department: Payer: MEDICAID

## 2024-02-07 DIAGNOSIS — Y907 Blood alcohol level of 200-239 mg/100 ml: Secondary | ICD-10-CM | POA: Diagnosis not present

## 2024-02-07 DIAGNOSIS — R079 Chest pain, unspecified: Secondary | ICD-10-CM | POA: Diagnosis present

## 2024-02-07 DIAGNOSIS — D696 Thrombocytopenia, unspecified: Secondary | ICD-10-CM | POA: Insufficient documentation

## 2024-02-07 DIAGNOSIS — R1084 Generalized abdominal pain: Secondary | ICD-10-CM | POA: Diagnosis not present

## 2024-02-07 DIAGNOSIS — R0789 Other chest pain: Secondary | ICD-10-CM | POA: Diagnosis not present

## 2024-02-07 DIAGNOSIS — F10129 Alcohol abuse with intoxication, unspecified: Secondary | ICD-10-CM | POA: Diagnosis not present

## 2024-02-07 LAB — CBC
HCT: 37.9 % — ABNORMAL LOW (ref 39.0–52.0)
Hemoglobin: 13.1 g/dL (ref 13.0–17.0)
MCH: 34 pg (ref 26.0–34.0)
MCHC: 34.6 g/dL (ref 30.0–36.0)
MCV: 98.4 fL (ref 80.0–100.0)
Platelets: 95 10*3/uL — ABNORMAL LOW (ref 150–400)
RBC: 3.85 MIL/uL — ABNORMAL LOW (ref 4.22–5.81)
RDW: 12.9 % (ref 11.5–15.5)
WBC: 6.7 10*3/uL (ref 4.0–10.5)
nRBC: 0 % (ref 0.0–0.2)

## 2024-02-07 LAB — COMPREHENSIVE METABOLIC PANEL WITH GFR
ALT: 181 U/L — ABNORMAL HIGH (ref 0–44)
AST: 319 U/L — ABNORMAL HIGH (ref 15–41)
Albumin: 3.6 g/dL (ref 3.5–5.0)
Alkaline Phosphatase: 70 U/L (ref 38–126)
Anion gap: 10 (ref 5–15)
BUN: 8 mg/dL (ref 8–23)
CO2: 21 mmol/L — ABNORMAL LOW (ref 22–32)
Calcium: 9.1 mg/dL (ref 8.9–10.3)
Chloride: 100 mmol/L (ref 98–111)
Creatinine, Ser: 0.66 mg/dL (ref 0.61–1.24)
GFR, Estimated: >60 mL/min (ref >60)
Glucose, Bld: 128 mg/dL — ABNORMAL HIGH (ref 70–99)
Potassium: 3.7 mmol/L (ref 3.5–5.1)
Sodium: 131 mmol/L — ABNORMAL LOW (ref 135–145)
Total Bilirubin: 1.1 mg/dL (ref 0.0–1.2)
Total Protein: 8.3 g/dL — ABNORMAL HIGH (ref 6.5–8.1)

## 2024-02-07 LAB — TROPONIN I (HIGH SENSITIVITY): Troponin I (High Sensitivity): 10 ng/L (ref <18)

## 2024-02-07 LAB — ETHANOL: Alcohol, Ethyl (B): 226 mg/dL — ABNORMAL HIGH (ref <15)

## 2024-02-07 NOTE — ED Triage Notes (Addendum)
 Pt in from home via [facility] with multiple complaints: cp, back and neck pain, "liver pain" and emesis. Pt endorses recent ETOH and cocaine use PTA.  VS w/EMS: 130/80 95HR 324ASA given by EMS

## 2024-02-08 ENCOUNTER — Emergency Department
Admission: EM | Admit: 2024-02-08 | Discharge: 2024-02-08 | Disposition: A | Discharge status: Home / Self Care | Payer: MEDICAID | Attending: Emergency Medicine | Admitting: Emergency Medicine

## 2024-02-08 DIAGNOSIS — R1084 Generalized abdominal pain: Secondary | ICD-10-CM

## 2024-02-08 DIAGNOSIS — F10920 Alcohol use, unspecified with intoxication, uncomplicated: Secondary | ICD-10-CM

## 2024-02-08 DIAGNOSIS — R079 Chest pain, unspecified: Secondary | ICD-10-CM

## 2024-02-08 LAB — URINALYSIS, ROUTINE W REFLEX MICROSCOPIC
Bilirubin Urine: NEGATIVE
Glucose, UA: NEGATIVE mg/dL
Hgb urine dipstick: NEGATIVE
Ketones, ur: NEGATIVE mg/dL
Leukocytes,Ua: NEGATIVE
Nitrite: NEGATIVE
Protein, ur: NEGATIVE mg/dL
Specific Gravity, Urine: 1.002 — ABNORMAL LOW (ref 1.005–1.030)
pH: 6 (ref 5.0–8.0)

## 2024-02-08 LAB — TROPONIN I (HIGH SENSITIVITY): Troponin I (High Sensitivity): 10 ng/L (ref <18)

## 2024-02-08 NOTE — Discharge Instructions (Signed)
 Your testing today did not show an emergency cause for your pain. I have included information for follow-up with primary care doctors in the area.  Return to the ER for new or worsening symptoms.

## 2024-02-08 NOTE — ED Provider Notes (Signed)
 [facility] Provider Note    Event Date/Time   First MD Initiated Contact with Patient 02/08/24 [phone]     (approximate)   History   Chest Pain and Alcohol Intoxication   HPI  Jose [name] is a 63 year old male with history of alcohol use disorder presenting to the emergency department for evaluation of chest pain and abdominal pain.  Reports that he has had many months of abdominal pain, not acutely changed.  Reports occasional associated nonbloody vomiting.  Additionally reports that over the past few days he has had chest pain.  Reports routine alcohol use including shortly prior to presentation.  Last cocaine use yesterday.   Physical Exam   Triage Vital Signs: ED Triage Vitals  Encounter Vitals Group     BP 02/07/24 2138 126/84     Systolic BP Percentile --      Diastolic BP Percentile --      Pulse Rate 02/07/24 2138 86     Resp 02/07/24 2138 20     Temp 02/07/24 2138 98.1 F (36.7 C)     Temp Source 02/07/24 2138 Oral     SpO2 02/07/24 2138 98 %     Weight 02/07/24 2139 150 lb (68 kg)     Height --      Head Circumference --      Peak Flow --      Pain Score --      Pain Loc --      Pain Education --      Exclude from Growth Chart --     Most recent vital signs: Vitals:   02/07/24 2138 02/08/24 0049  BP: 126/84 139/85  Pulse: 86 95  Resp: 20 16  Temp: 98.1 F (36.7 C) 98.9 F (37.2 C)  SpO2: 98% 98%     General: Awake, interactive  CV:  Regular rate, good peripheral perfusion.  Resp:  Unlabored respirations, lungs clear to auscultation Abd:  Nondistended, soft, mild generalized tenderness without rebound or guarding Neuro:  Symmetric facial movement, fluid speech   ED Results / Procedures / Treatments   Labs (all labs ordered are listed, but only abnormal results are displayed) Labs Reviewed  CBC - Abnormal; Notable for the following components:      Result Value   RBC 3.85 (*)    HCT 37.9 (*)    Platelets  95 (*)    All other components within normal limits  COMPREHENSIVE METABOLIC PANEL WITH GFR - Abnormal; Notable for the following components:   Sodium 131 (*)    CO2 21 (*)    Glucose, Bld 128 (*)    Total Protein 8.3 (*)    AST 319 (*)    ALT 181 (*)    All other components within normal limits  ETHANOL - Abnormal; Notable for the following components:   Alcohol, Ethyl (B) 226 (*)    All other components within normal limits  URINALYSIS, ROUTINE W REFLEX MICROSCOPIC - Abnormal; Notable for the following components:   Color, Urine STRAW (*)    APPearance CLEAR (*)    Specific Gravity, Urine 1.002 (*)    All other components within normal limits  TROPONIN I (HIGH SENSITIVITY)  TROPONIN I (HIGH SENSITIVITY)     EKG EKG independently reviewed interpreted by myself (ER attending) demonstrates:  EKG demonstrates normal sinus rhythm rate of 82, PR 166, QRS 138, QTc 488, no acute ST changes, right bundle branch block noted  RADIOLOGY Imaging independently reviewed  and interpreted by myself demonstrates:  CXR without focal consolidation  Formal Radiology Read:  DG Chest 2 View Result Date: 02/07/2024 CLINICAL DATA:  Chest pain EXAM: CHEST - 2 VIEW COMPARISON:  None Available. FINDINGS: Lungs are well expanded, symmetric, and clear. No pneumothorax or pleural effusion. Cardiac size within normal limits. Pulmonary vascularity is normal. Osseous structures are age-appropriate. Multiple healed left rib fractures. No acute bone abnormality. IMPRESSION: 1. No active cardiopulmonary disease. Electronically Signed   By: Worthy Heads M.D.   On: 02/07/2024 22:04    PROCEDURES:  Critical Care performed: No  Procedures   MEDICATIONS ORDERED IN ED: Medications - No data to display   IMPRESSION / MDM / ASSESSMENT AND PLAN / ED COURSE  I reviewed the triage vital signs and the nursing notes.  Differential diagnosis includes, but is not limited to, exacerbation of chronic pain, cocaine  associated chest pain, ACS, pancreatitis, low suspicion acute intra-abdominal process given chronic history of pain without acute change  Patient's presentation is most consistent with acute presentation with potential threat to life or bodily function.  63 year old male presenting with chest and abdominal pain.  Stable vitals on presentation.  CBC with stable thrombocytopenia, CMP with stable liver dysfunction.  EtOH elevated at 226.  Negative troponin x 2.  Urine without evidence of infection.  Chest x-Larri Yehle without acute findings.  EKG without acute ischemic findings.  Overall low suspicion emergent process.  Patient clinically sober at the time of my evaluation several hours after presentation.  Do think patient is stable for discharge home.  Strict return precautions provided.  Patient discharged in stable condition.      FINAL CLINICAL IMPRESSION(S) / ED DIAGNOSES   Final diagnoses:  Nonspecific chest pain  Alcoholic intoxication without complication (HCC)  Generalized abdominal pain     Rx / DC Orders   ED Discharge Orders     None        Note:  This document was prepared using Dragon voice recognition software and may include unintentional dictation errors.   Claria Crofts, MD 02/08/24 [phone]

## 2024-02-19 NOTE — Congregational Nurse Program (Signed)
  Dept: [phone]   Congregational Nurse Program Note  Date of Encounter: 02/19/2024 Client to [facility] day center, nurse led clinic, with request for foot care. Foot care provided. No other needs at this time. Juvenal Opoka BSN, RN Past Medical History: Past Medical History:  Diagnosis Date   Cocaine use    EtOH dependence (HCC)    Hep C w/o coma, chronic (HCC)    Hypertension    Liver cirrhosis, alcoholic (HCC)    Polysubstance abuse (HCC)    Pulmonary embolism [facility])     Encounter Details:  Community Questionnaire - 02/19/24 1328       Questionnaire   Ask client: Do you give verbal consent for me to treat you today? Yes    Student Assistance N/A    Location Patient Served  [facility]    Encounter Setting CN site    Population Status Unhoused    Insurance Medicaid   [place] Access   Insurance/Financial Assistance Referral N/A    Medication N/A    Medical Provider No   client was seen in July 2023, has not rescheduled follow up that was due in October   Screening Referrals Made N/A    Medical Referrals Made N/A    Medical Appointment Completed N/A    CNP Interventions Advocate/Support;Educate    Screenings CN Performed N/A    ED Visit Averted N/A    Life-Saving Intervention Made N/A

## 2024-02-21 ENCOUNTER — Other Ambulatory Visit: Payer: Self-pay

## 2024-02-21 ENCOUNTER — Emergency Department
Admission: EM | Admit: 2024-02-21 | Discharge: 2024-02-22 | Disposition: A | Discharge status: Home / Self Care | Payer: MEDICAID | Attending: Emergency Medicine | Admitting: Emergency Medicine

## 2024-02-21 DIAGNOSIS — F10920 Alcohol use, unspecified with intoxication, uncomplicated: Secondary | ICD-10-CM

## 2024-02-21 DIAGNOSIS — I10 Essential (primary) hypertension: Secondary | ICD-10-CM | POA: Insufficient documentation

## 2024-02-21 DIAGNOSIS — Z79899 Other long term (current) drug therapy: Secondary | ICD-10-CM | POA: Diagnosis not present

## 2024-02-21 DIAGNOSIS — Y906 Blood alcohol level of 120-199 mg/100 ml: Secondary | ICD-10-CM | POA: Diagnosis not present

## 2024-02-21 DIAGNOSIS — M79671 Pain in right foot: Secondary | ICD-10-CM | POA: Diagnosis not present

## 2024-02-21 DIAGNOSIS — F10120 Alcohol abuse with intoxication, uncomplicated: Secondary | ICD-10-CM | POA: Diagnosis present

## 2024-02-21 DIAGNOSIS — M542 Cervicalgia: Secondary | ICD-10-CM | POA: Diagnosis not present

## 2024-02-21 DIAGNOSIS — Z7901 Long term (current) use of anticoagulants: Secondary | ICD-10-CM | POA: Insufficient documentation

## 2024-02-21 DIAGNOSIS — G8929 Other chronic pain: Secondary | ICD-10-CM

## 2024-02-21 DIAGNOSIS — M79672 Pain in left foot: Secondary | ICD-10-CM | POA: Diagnosis not present

## 2024-02-21 DIAGNOSIS — R079 Chest pain, unspecified: Secondary | ICD-10-CM | POA: Insufficient documentation

## 2024-02-21 LAB — CBC WITH DIFFERENTIAL/PLATELET
Abs Immature Granulocytes: 0.04 10*3/uL (ref 0.00–0.07)
Basophils Absolute: 0 10*3/uL (ref 0.0–0.1)
Basophils Relative: 0 %
Eosinophils Absolute: 0.1 10*3/uL (ref 0.0–0.5)
Eosinophils Relative: 1 %
HCT: 41.6 % (ref 39.0–52.0)
Hemoglobin: 14.6 g/dL (ref 13.0–17.0)
Immature Granulocytes: 1 %
Lymphocytes Relative: 32 %
Lymphs Abs: 2.3 10*3/uL (ref 0.7–4.0)
MCH: 33.5 pg (ref 26.0–34.0)
MCHC: 35.1 g/dL (ref 30.0–36.0)
MCV: 95.4 fL (ref 80.0–100.0)
Monocytes Absolute: 0.7 10*3/uL (ref 0.1–1.0)
Monocytes Relative: 9 %
Neutro Abs: 4.1 10*3/uL (ref 1.7–7.7)
Neutrophils Relative %: 57 %
Platelets: 98 10*3/uL — ABNORMAL LOW (ref 150–400)
RBC: 4.36 MIL/uL (ref 4.22–5.81)
RDW: 12.8 % (ref 11.5–15.5)
WBC: 7.1 10*3/uL (ref 4.0–10.5)
nRBC: 0 % (ref 0.0–0.2)

## 2024-02-21 LAB — COMPREHENSIVE METABOLIC PANEL WITH GFR
ALT: 173 U/L — ABNORMAL HIGH (ref 0–44)
AST: 259 U/L — ABNORMAL HIGH (ref 15–41)
Albumin: 3.8 g/dL (ref 3.5–5.0)
Alkaline Phosphatase: 74 U/L (ref 38–126)
Anion gap: 11 (ref 5–15)
BUN: 6 mg/dL — ABNORMAL LOW (ref 8–23)
CO2: 21 mmol/L — ABNORMAL LOW (ref 22–32)
Calcium: 9.2 mg/dL (ref 8.9–10.3)
Chloride: 99 mmol/L (ref 98–111)
Creatinine, Ser: 0.65 mg/dL (ref 0.61–1.24)
GFR, Estimated: >60 mL/min (ref >60)
Glucose, Bld: 102 mg/dL — ABNORMAL HIGH (ref 70–99)
Potassium: 3.7 mmol/L (ref 3.5–5.1)
Sodium: 131 mmol/L — ABNORMAL LOW (ref 135–145)
Total Bilirubin: 1 mg/dL (ref 0.0–1.2)
Total Protein: 8.7 g/dL — ABNORMAL HIGH (ref 6.5–8.1)

## 2024-02-21 LAB — TROPONIN I (HIGH SENSITIVITY): Troponin I (High Sensitivity): 7 ng/L (ref <18)

## 2024-02-21 LAB — ETHANOL: Alcohol, Ethyl (B): 197 mg/dL — ABNORMAL HIGH (ref <15)

## 2024-02-21 MED ORDER — LIDOCAINE 5 % EX PTCH
1.0000 | MEDICATED_PATCH | CUTANEOUS | Status: DC
  Administered 2024-02-22: 1 via TRANSDERMAL
  Filled 2024-02-21: qty 1

## 2024-02-21 MED ORDER — ALBUTEROL SULFATE HFA 108 (90 BASE) MCG/ACT IN AERS
2.0000 | INHALATION_SPRAY | Freq: Four times a day (QID) | RESPIRATORY_TRACT | Status: DC | PRN
  Filled 2024-02-21: qty 6.7

## 2024-02-21 MED ORDER — IBUPROFEN 800 MG PO TABS
800.0000 mg | ORAL_TABLET | Freq: Four times a day (QID) | ORAL | Status: DC | PRN
  Administered 2024-02-22: 800 mg via ORAL
  Filled 2024-02-21: qty 1

## 2024-02-21 MED ORDER — ONDANSETRON 4 MG PO TBDP: 4.0000 mg | ORAL_TABLET | Freq: Four times a day (QID) | ORAL | Status: DC | PRN

## 2024-02-21 NOTE — ED Triage Notes (Addendum)
 Pt to ed from home via [facility] for alcohol intoxication. Pt is caox4, in no acute distress and was ambulatory. Pt also has multiple complaints. Liver pain, back pain and nausea.  SR for EMS on EKG 324mg  ASA

## 2024-02-21 NOTE — ED Notes (Signed)
 Pt reports feet pain has sores, generalized body aches

## 2024-02-21 NOTE — ED Notes (Signed)
 Dr. Author Board at bedside talking to pt

## 2024-02-21 NOTE — ED Provider Notes (Signed)
 [facility] Provider Note    Event Date/Time   First MD Initiated Contact with Patient 02/21/24 2317     (approximate)   History   Alcohol Intoxication   HPI  Jose [name] is a 63 y.o. male well-known to our emergency department with history of polysubstance abuse, hypertension, hepatitis C who presents to the emergency department with complaints of chronic neck pain, chronic chest pain and bilateral foot pain.  Reports he has been walking a lot recently.  No difficulty breathing, fevers or cough.  No calf tenderness or calf swelling.  Able to ambulate here with steady gait.   History provided by patient.    Past Medical History:  Diagnosis Date   Cocaine use    EtOH dependence (HCC)    Hep C w/o coma, chronic (HCC)    Hypertension    Liver cirrhosis, alcoholic (HCC)    Polysubstance abuse (HCC)    Pulmonary embolism (HCC)     Past Surgical History:  Procedure Laterality Date   ESOPHAGOGASTRODUODENOSCOPY (EGD) WITH PROPOFOL  N/A 03/02/2019   Procedure: ESOPHAGOGASTRODUODENOSCOPY (EGD) WITH PROPOFOL ;  Surgeon: Dorena Gander, MD;  Location: [facility] ENDOSCOPY;  Service: General;  Laterality: N/A;   IR GASTROSTOMY TUBE REMOVAL  05/04/2019   PEG PLACEMENT N/A 03/02/2019   Procedure: PERCUTANEOUS ENDOSCOPIC GASTROSTOMY (PEG) PLACEMENT;  Surgeon: Dorena Gander, MD;  Location: [facility] ENDOSCOPY;  Service: General;  Laterality: N/A;   PERCUTANEOUS TRACHEOSTOMY N/A 02/12/2019   Procedure: PERCUTANEOUS TRACHEOSTOMY;  Surgeon: Dorena Gander, MD;  Location: [facility] OR;  Service: General;  Laterality: N/A;    MEDICATIONS:  Prior to Admission medications   Medication Sig Start Date End Date Taking? Authorizing Provider  albuterol  (VENTOLIN  HFA) 108 (90 Base) MCG/ACT inhaler Inhale 2 puffs into the lungs every 6 (six) hours as needed for wheezing or shortness of breath. 10/14/23   Evans, Alexandra, PA-C  amLODipine  (NORVASC ) 5 MG tablet Take 1 tablet (5 mg total) by  mouth daily. Patient not taking: Reported on 01/03/2024 11/26/21 12/30/21  Kipper Buch, Clover Dao, DO  apixaban  (ELIQUIS ) 5 MG TABS tablet Take 1 tablet (5 mg total) by mouth 2 (two) times daily. Patient not taking: Reported on 12/03/2022 11/26/21   Anona Giovannini, Clover Dao, DO  ibuprofen  (ADVIL ) 800 MG tablet Take 1 tablet (800 mg total) by mouth every 8 (eight) hours as needed. Patient not taking: Reported on 01/03/2024 11/20/23   Jahanna Raether, Clover Dao, DO  ibuprofen  (ADVIL ) 800 MG tablet Take 1 tablet (800 mg total) by mouth every 8 (eight) hours as needed. Patient not taking: Reported on 01/03/2024 11/20/23   Ruthanna Macchia, Clover Dao, DO  pantoprazole  (PROTONIX ) 40 MG tablet Take 1 tablet (40 mg total) by mouth daily. Patient not taking: Reported on 01/03/2024 01/16/23 01/16/24  Buell Carmin, MD    Physical Exam   Triage Vital Signs: ED Triage Vitals  Encounter Vitals Group     BP 02/21/24 2311 (!) 153/89     Systolic BP Percentile --      Diastolic BP Percentile --      Pulse Rate 02/21/24 2311 75     Resp 02/21/24 2311 16     Temp 02/21/24 2311 98 F (36.7 C)     Temp Source 02/21/24 2311 Oral     SpO2 02/21/24 2311 98 %     Weight 02/21/24 2301 149 lb 14.6 oz (68 kg)     Height 02/21/24 2301 5\' 6"  (1.676 m)     Head Circumference --  Peak Flow --      Pain Score 02/21/24 2301 0     Pain Loc --      Pain Education --      Exclude from Growth Chart --     Most recent vital signs: Vitals:   02/21/24 2311 02/22/24 0251  BP: (!) 153/89 (!) 140/81  Pulse: 75 93  Resp: 16 16  Temp: 98 F (36.7 C) 98.6 F (37 C)  SpO2: 98% 97%    CONSTITUTIONAL: Alert, responds appropriately to questions. Well-appearing; well-nourished HEAD: Normocephalic, atraumatic EYES: Conjunctivae clear, pupils appear equal, sclera nonicteric ENT: normal nose; moist mucous membranes NECK: Supple, normal ROM, no midline step-off or deformity CARD: RRR; S1 and S2 appreciated RESP: Normal chest excursion without splinting or tachypnea;  breath sounds clear and equal bilaterally; no wheezes, no rhonchi, no rales, no hypoxia or respiratory distress, speaking full sentences ABD/GI: Non-distended; soft, non-tender, no rebound, no guarding, no peritoneal signs BACK: The back appears normal EXT: Normal ROM in all joints; no deformity noted, no edema no calf tenderness or calf swelling.  2+ DP pulses bilaterally. SKIN: Normal color for age and race; warm; no rash on exposed skin NEURO: Moves all extremities equally, normal speech PSYCH: The patient's mood and manner are appropriate.  Calm, cooperative.   ED Results / Procedures / Treatments   LABS: (all labs ordered are listed, but only abnormal results are displayed) Labs Reviewed  ETHANOL - Abnormal; Notable for the following components:      Result Value   Alcohol, Ethyl (B) 197 (*)    All other components within normal limits  CBC WITH DIFFERENTIAL/PLATELET - Abnormal; Notable for the following components:   Platelets 98 (*)    All other components within normal limits  COMPREHENSIVE METABOLIC PANEL WITH GFR - Abnormal; Notable for the following components:   Sodium 131 (*)    CO2 21 (*)    Glucose, Bld 102 (*)    BUN 6 (*)    Total Protein 8.7 (*)    AST 259 (*)    ALT 173 (*)    All other components within normal limits  LIPASE, BLOOD  TROPONIN I (HIGH SENSITIVITY)  TROPONIN I (HIGH SENSITIVITY)     EKG:  EKG Interpretation Date/Time:  Saturday Feb 22 2024 00:25:11 EDT Ventricular Rate:  76 PR Interval:  170 QRS Duration:  146 QT Interval:  452 QTC Calculation: 508 R Axis:   73  Text Interpretation: Normal sinus rhythm Right bundle branch block Abnormal ECG When compared with ECG of 07-Feb-2024 21:37, No significant change was found Confirmed by Verneda Golder [phone]) on 02/22/2024 12:40:26 AM         RADIOLOGY: My personal review and interpretation of imaging:    I have personally reviewed all radiology reports.   No results  found.   PROCEDURES:  Critical Care performed: No     Procedures    IMPRESSION / MDM / ASSESSMENT AND PLAN / ED COURSE  I reviewed the triage vital signs and the nursing notes.    Patient here with complaints of chronic neck pain, chest pain, foot pain.  Patient is homeless.  Well-known to our emergency department with frequent visits for the same.  The patient is on the cardiac monitor to evaluate for evidence of arrhythmia and/or significant heart rate changes.   DIFFERENTIAL DIAGNOSIS (includes but not limited to):   Alcohol intoxication, homelessness, malingering, chronic pain, less likely ACS, PE, dissection, pneumonia, CHF, pneumothorax, cervical myelopathy,  acute fracture, critical spinal stenosis, discitis, osteomyelitis, transverse myelitis, cauda equina   Patient's presentation is most consistent with acute presentation with potential threat to life or bodily function.   PLAN: EKG shows no new ischemic change.  Will obtain cardiac labs.  He has had multiple chest x-rays all of which recently have been negative.  His lungs are clear to auscultation he has no fever, tachypnea or hypoxia.  I do not feel he needs another chest x-ray today.  Will give ibuprofen , Lidoderm  for symptomatic relief.   MEDICATIONS GIVEN IN ED: Medications  ibuprofen  (ADVIL ) tablet 800 mg (800 mg Oral Given 02/22/24 0124)  lidocaine  (LIDODERM ) 5 % 1 patch (1 patch Transdermal Patch Applied 02/22/24 0014)  ondansetron  (ZOFRAN -ODT) disintegrating tablet 4 mg (has no administration in time range)  albuterol  (VENTOLIN  HFA) 108 (90 Base) MCG/ACT inhaler 2 puff (has no administration in time range)     ED COURSE: Troponin x 2 negative.  Alcohol of 197.  Chronic mild thrombocytopenia  and elevated AST greater than ALT which stable compared to prior and related to his alcohol abuse.  Patient allowed to eat, drink here.  He now states that he is ready to go and will wait in the waiting room until the  morning to leave.  Will provide with discharge paperwork.  Recommended ibuprofen  as needed for discomfort.  At this time, I do not feel there is any life-threatening condition present. I reviewed all nursing notes, vitals, pertinent previous records.  All lab and urine results, EKGs, imaging ordered have been independently reviewed and interpreted by myself.  I reviewed all available radiology reports from any imaging ordered this visit.  Based on my assessment, I feel the patient is safe to be discharged home without further emergent workup and can continue workup as an outpatient as needed. Discussed all findings, treatment plan as well as usual and customary return precautions.  They verbalize understanding and are comfortable with this plan.  Outpatient follow-up has been provided as needed.  All questions have been answered.    CONSULTS:  none   OUTSIDE RECORDS REVIEWED: Reviewed last admission in January 2023.       FINAL CLINICAL IMPRESSION(S) / ED DIAGNOSES   Final diagnoses:  Alcoholic intoxication without complication (HCC)  Chronic neck pain  Chronic chest pain  Bilateral foot pain     Rx / DC Orders   ED Discharge Orders     None        Note:  This document was prepared using Dragon voice recognition software and may include unintentional dictation errors.   Nycere Presley, Clover Dao, DO 02/22/24 [phone]

## 2024-02-22 LAB — TROPONIN I (HIGH SENSITIVITY): Troponin I (High Sensitivity): 9 ng/L (ref <18)

## 2024-02-22 LAB — LIPASE, BLOOD: Lipase: 51 U/L (ref 11–51)

## 2024-02-22 NOTE — ED Notes (Signed)
 Pt verbalizes understanding of discharge instructions.

## 2024-02-22 NOTE — Discharge Instructions (Addendum)
   Your lab work today including cardiac enzymes x 2 were normal.  Your EKG showed no new changes.  Steps to find a Primary Care Provider (PCP):  Call [phone] or [phone] to access "[place] Find a Doctor Service."  2.  You may also go on the [facility] website at [email or web address]

## 2024-03-03 NOTE — Congregational Nurse Program (Signed)
  Dept: [phone]   Congregational Nurse Program Note  Date of Encounter: 03/03/2024 Client to [facility] day center, nurse led clinic, for foot care. Foot care provided, nails trimmed, heel balm applied. Juvenal Opoka BSN, RN Past Medical History: Past Medical History:  Diagnosis Date   Cocaine use    EtOH dependence (HCC)    Hep C w/o coma, chronic (HCC)    Hypertension    Liver cirrhosis, alcoholic (HCC)    Polysubstance abuse (HCC)    Pulmonary embolism [facility])     Encounter Details:  Community Questionnaire - 03/03/24 1246       Questionnaire   Ask client: Do you give verbal consent for me to treat you today? Yes    Student Assistance N/A    Location Patient Served  [facility]    Encounter Setting CN site    Population Status Unhoused    Insurance Medicaid   [place] Access   Insurance/Financial Assistance Referral N/A    Medication N/A    Medical Provider No   client was seen in July 2023, has not rescheduled follow up that was due in October   Screening Referrals Made N/A    Medical Referrals Made N/A    Medical Appointment Completed N/A    CNP Interventions Advocate/Support;Educate    Screenings CN Performed N/A    ED Visit Averted N/A    Life-Saving Intervention Made N/A

## 2024-03-05 NOTE — Congregational Nurse Program (Signed)
  Dept: [phone]   Congregational Nurse Program Note  Date of Encounter: 03/04/2024 Client to [name] day center, nurse led clinic for blood pressure check and foot care. BP 132/82 (BP Location: Left Arm, Patient Position: Sitting, Cuff Size: Normal)   Pulse 96   SpO2 97% . Foot care provided. No open areas or edema. Juvenal Opoka BSN, RN  Past Medical History: Past Medical History:  Diagnosis Date   Cocaine use    EtOH dependence (HCC)    Hep C w/o coma, chronic (HCC)    Hypertension    Liver cirrhosis, alcoholic (HCC)    Polysubstance abuse (HCC)    Pulmonary embolism [facility])     Encounter Details:  Community Questionnaire - 03/04/24 1326       Questionnaire   Ask client: Do you give verbal consent for me to treat you today? Yes    Student Assistance N/A    Location Patient Served  [facility]    Encounter Setting CN site    Population Status Unhoused    Insurance Medicaid   [place] Access   Insurance/Financial Assistance Referral N/A    Medication N/A    Medical Provider No   client was seen in July 2023, has not rescheduled follow up that was due in October   Screening Referrals Made N/A    Medical Referrals Made N/A    Medical Appointment Completed N/A    CNP Interventions Advocate/Support;Educate    Screenings CN Performed Blood Pressure    ED Visit Averted N/A    Life-Saving Intervention Made N/A

## 2024-03-11 NOTE — Congregational Nurse Program (Signed)
  Dept: [phone]   Congregational Nurse Program Note  Date of Encounter: 03/11/2024 Client to [facility] day center with request for foot care. Foot are provided. No other needs at this time. Juvenal Opoka BSN, RN Past Medical History: Past Medical History:  Diagnosis Date   Cocaine use    EtOH dependence (HCC)    Hep C w/o coma, chronic (HCC)    Hypertension    Liver cirrhosis, alcoholic (HCC)    Polysubstance abuse (HCC)    Pulmonary embolism [facility])     Encounter Details:  Community Questionnaire - 03/11/24 1321       Questionnaire   Ask client: Do you give verbal consent for me to treat you today? Yes    Student Assistance N/A    Location Patient Served  [facility]    Encounter Setting CN site    Population Status Unhoused    Insurance Medicaid   [place] Access   Insurance/Financial Assistance Referral N/A    Medication N/A    Medical Provider No   client was seen in July 2023, has not rescheduled follow up that was due in October   Screening Referrals Made N/A    Medical Referrals Made N/A    Medical Appointment Completed N/A    CNP Interventions Advocate/Support;Educate    Screenings CN Performed N/A    ED Visit Averted N/A    Life-Saving Intervention Made N/A

## 2024-03-17 NOTE — Congregational Nurse Program (Signed)
  Dept: [phone]   Congregational Nurse Program Note  Date of Encounter: 03/17/2024 Client to [facility] day center with request for blood pressure check. BP 128/84 (BP Location: Left Arm, Patient Position: Sitting, Cuff Size: Normal) . Client reports he has been unable to drink today, drank and energy drink and vomited it up. Last Alcohol was yesterday evening. Rn stressed the importance of hydration, water  available at the center through out the morning and water  bottles distributed prior to closing this afternoon.  Client stated he did have a place to get out of the sun, but it was not air conditioned. Client will return to the center again tomorrow for continued support. MARLA Marina BSN, RN  Past Medical History: Past Medical History:  Diagnosis Date   Cocaine use    EtOH dependence (HCC)    Hep C w/o coma, chronic (HCC)    Hypertension    Liver cirrhosis, alcoholic (HCC)    Polysubstance abuse (HCC)    Pulmonary embolism (HCC)     Encounter Details:  Community Questionnaire - 03/17/24 1000       Questionnaire   Ask client: Do you give verbal consent for me to treat you today? Yes    Student Assistance N/A    Location Patient Served  [facility]    Encounter Setting CN site    Population Status Unhoused    Insurance Medicaid   [place] Access   Insurance/Financial Assistance Referral N/A    Medication N/A    Medical Provider No   client was seen in July 2023, has not rescheduled follow up that was due in October   Screening Referrals Made N/A    Medical Referrals Made N/A    Medical Appointment Completed N/A    CNP Interventions Advocate/Support;Educate    Screenings CN Performed Blood Pressure    ED Visit Averted N/A    Life-Saving Intervention Made N/A

## 2024-03-21 ENCOUNTER — Emergency Department
Admission: EM | Admit: 2024-03-21 | Discharge: 2024-03-21 | Disposition: A | Discharge status: Home / Self Care | Payer: MEDICAID | Attending: Emergency Medicine | Admitting: Emergency Medicine

## 2024-03-21 ENCOUNTER — Other Ambulatory Visit: Payer: Self-pay

## 2024-03-21 ENCOUNTER — Encounter: Payer: Self-pay | Admitting: Emergency Medicine

## 2024-03-21 DIAGNOSIS — Y9301 Activity, walking, marching and hiking: Secondary | ICD-10-CM | POA: Insufficient documentation

## 2024-03-21 DIAGNOSIS — S50812A Abrasion of left forearm, initial encounter: Secondary | ICD-10-CM | POA: Diagnosis not present

## 2024-03-21 DIAGNOSIS — Y92410 Unspecified street and highway as the place of occurrence of the external cause: Secondary | ICD-10-CM | POA: Diagnosis not present

## 2024-03-21 DIAGNOSIS — S80212A Abrasion, left knee, initial encounter: Secondary | ICD-10-CM | POA: Diagnosis not present

## 2024-03-21 DIAGNOSIS — Y909 Presence of alcohol in blood, level not specified: Secondary | ICD-10-CM | POA: Diagnosis not present

## 2024-03-21 DIAGNOSIS — S80211A Abrasion, right knee, initial encounter: Secondary | ICD-10-CM | POA: Insufficient documentation

## 2024-03-21 DIAGNOSIS — W19XXXA Unspecified fall, initial encounter: Secondary | ICD-10-CM

## 2024-03-21 DIAGNOSIS — S50811A Abrasion of right forearm, initial encounter: Secondary | ICD-10-CM | POA: Diagnosis not present

## 2024-03-21 DIAGNOSIS — F10929 Alcohol use, unspecified with intoxication, unspecified: Secondary | ICD-10-CM

## 2024-03-21 DIAGNOSIS — F10129 Alcohol abuse with intoxication, unspecified: Secondary | ICD-10-CM | POA: Diagnosis present

## 2024-03-21 DIAGNOSIS — T07XXXA Unspecified multiple injuries, initial encounter: Secondary | ICD-10-CM

## 2024-03-21 MED ORDER — BACITRACIN ZINC 500 UNIT/GM EX OINT
TOPICAL_OINTMENT | CUTANEOUS | Status: DC
  Filled 2024-03-21: qty 0.9

## 2024-03-21 NOTE — Discharge Instructions (Addendum)
 Please try to decrease your alcohol intake.  Keep your wounds clean and dry, and apply a thin layer of bacitracin  ointment to them twice daily.  We recommend you follow-up with the [facility] [place] to establish primary care.
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
## 2024-03-30 ENCOUNTER — Other Ambulatory Visit: Payer: Self-pay

## 2024-03-30 ENCOUNTER — Emergency Department: Payer: MEDICAID

## 2024-03-30 DIAGNOSIS — R519 Headache, unspecified: Secondary | ICD-10-CM | POA: Insufficient documentation

## 2024-03-30 DIAGNOSIS — F1010 Alcohol abuse, uncomplicated: Secondary | ICD-10-CM | POA: Insufficient documentation

## 2024-03-30 DIAGNOSIS — M25561 Pain in right knee: Secondary | ICD-10-CM | POA: Insufficient documentation

## 2024-03-30 DIAGNOSIS — F0781 Postconcussional syndrome: Secondary | ICD-10-CM | POA: Diagnosis not present

## 2024-03-30 DIAGNOSIS — I10 Essential (primary) hypertension: Secondary | ICD-10-CM | POA: Insufficient documentation

## 2024-03-30 LAB — BASIC METABOLIC PANEL WITH GFR
Anion gap: 11 (ref 5–15)
BUN: 14 mg/dL (ref 8–23)
CO2: 21 mmol/L — ABNORMAL LOW (ref 22–32)
Calcium: 9.9 mg/dL (ref 8.9–10.3)
Chloride: 99 mmol/L (ref 98–111)
Creatinine, Ser: 0.87 mg/dL (ref 0.61–1.24)
GFR, Estimated: >60 mL/min (ref >60)
Glucose, Bld: 121 mg/dL — ABNORMAL HIGH (ref 70–99)
Potassium: 3.4 mmol/L — ABNORMAL LOW (ref 3.5–5.1)
Sodium: 131 mmol/L — ABNORMAL LOW (ref 135–145)

## 2024-03-30 LAB — CBC
HCT: 38.7 % — ABNORMAL LOW (ref 39.0–52.0)
Hemoglobin: 13.3 g/dL (ref 13.0–17.0)
MCH: 33.6 pg (ref 26.0–34.0)
MCHC: 34.4 g/dL (ref 30.0–36.0)
MCV: 97.7 fL (ref 80.0–100.0)
Platelets: 83 K/uL — ABNORMAL LOW (ref 150–400)
RBC: 3.96 MIL/uL — ABNORMAL LOW (ref 4.22–5.81)
RDW: 13.4 % (ref 11.5–15.5)
WBC: 6.1 K/uL (ref 4.0–10.5)
nRBC: 0 % (ref 0.0–0.2)

## 2024-03-30 LAB — TROPONIN I (HIGH SENSITIVITY): Troponin I (High Sensitivity): 5 ng/L (ref <18)
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
## 2024-03-31 ENCOUNTER — Emergency Department
Admission: EM | Admit: 2024-03-31 | Discharge: 2024-03-31 | Disposition: A | Discharge status: Home / Self Care | Payer: MEDICAID | Attending: Emergency Medicine | Admitting: Emergency Medicine

## 2024-03-31 ENCOUNTER — Emergency Department: Payer: MEDICAID

## 2024-03-31 DIAGNOSIS — F1010 Alcohol abuse, uncomplicated: Secondary | ICD-10-CM

## 2024-03-31 DIAGNOSIS — F0781 Postconcussional syndrome: Secondary | ICD-10-CM

## 2024-03-31 LAB — TROPONIN I (HIGH SENSITIVITY): Troponin I (High Sensitivity): 8 ng/L (ref <18)

## 2024-03-31 MED ORDER — TRAMADOL HCL 50 MG PO TABS
50.0000 mg | ORAL_TABLET | Freq: Once | ORAL | Status: AC
  Administered 2024-03-31: 50 mg via ORAL
  Filled 2024-03-31: qty 1
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
## 2024-04-02 NOTE — Congregational Nurse Program (Signed)
  Dept: [phone]   Congregational Nurse Program Note  Date of Encounter: 04/02/2024 Client to [facility] day center, nurse led clinic, with request for foot care. Foot care provided. Support given. MARLA Marina BSN, RN Past Medical History: Past Medical History:  Diagnosis Date   Cocaine use    EtOH dependence (HCC)    Hep C w/o coma, chronic (HCC)    Hypertension    Liver cirrhosis, alcoholic (HCC)    Polysubstance abuse (HCC)    Pulmonary embolism [facility])     Encounter Details:  Community Questionnaire - 04/02/24 1157       Questionnaire   Ask client: Do you give verbal consent for me to treat you today? Yes    Student Assistance N/A    Location Patient Served  [facility]    Encounter Setting CN site    Population Status Unhoused    Insurance Medicaid   [place] Access   Insurance/Financial Assistance Referral N/A    Medication N/A    Medical Provider No   client was seen in July 2023, has not rescheduled follow up that was due in October   Screening Referrals Made N/A    Medical Referrals Made N/A    Medical Appointment Completed N/A    CNP Interventions Advocate/Support;Educate    Screenings CN Performed Blood Pressure    ED Visit Averted N/A    Life-Saving Intervention Made N/A

## 2024-04-07 NOTE — Congregational Nurse Program (Signed)
  Dept: [phone]   Congregational Nurse Program Note  Date of Encounter: 04/07/2024 Client to [facility] day center, nurse led clinic, with request for foot care. Toes nails trimmed and filed. No open areas or blisters noted. MARLA Marina BSN, RN Past Medical History: Past Medical History:  Diagnosis Date   Cocaine use    EtOH dependence (HCC)    Hep C w/o coma, chronic (HCC)    Hypertension    Liver cirrhosis, alcoholic (HCC)    Polysubstance abuse (HCC)    Pulmonary embolism (HCC)     Encounter Details:  Community Questionnaire - 04/07/24 1200       Questionnaire   Ask client: Do you give verbal consent for me to treat you today? Yes    Student Assistance N/A    Location Patient Served  [facility]    Encounter Setting CN site    Population Status Unhoused    Insurance Medicaid   [place] Access   Insurance/Financial Assistance Referral N/A    Medication N/A    Medical Provider No   client was seen in July 2023, has not rescheduled follow up that was due in October   Screening Referrals Made N/A    Medical Referrals Made N/A    Medical Appointment Completed N/A    CNP Interventions Advocate/Support;Educate    Screenings CN Performed Blood Pressure    ED Visit Averted N/A    Life-Saving Intervention Made N/A

## 2024-04-14 NOTE — Congregational Nurse Program (Signed)
  Dept: [phone]   Congregational Nurse Program Note  Date of Encounter: 04/14/2024 Client to [facility] day center for foot care and first aid for a suspicious lesion to his left deltoid. Antibiotic ointment applied to lesion. RN has provided education regarding sun protection and the need for regular physicals for the last few years. Support and education continue. MARLA Marina BSN, RN Past Medical History: Past Medical History:  Diagnosis Date   Cocaine use    EtOH dependence (HCC)    Hep C w/o coma, chronic (HCC)    Hypertension    Liver cirrhosis, alcoholic (HCC)    Polysubstance abuse (HCC)    Pulmonary embolism (HCC)     Encounter Details:  Community Questionnaire - 04/14/24 1150       Questionnaire   Ask client: Do you give verbal consent for me to treat you today? Yes    Student Assistance N/A    Location Patient Served  [facility]    Encounter Setting CN site    Population Status Unhoused    Insurance Medicaid   [place] Access   Insurance/Financial Assistance Referral N/A    Medication N/A    Medical Provider No   client was seen in July 2023, has not rescheduled follow up that was due in October   Screening Referrals Made N/A    Medical Referrals Made N/A    Medical Appointment Completed N/A    CNP Interventions Advocate/Support;Educate    Screenings CN Performed Blood Pressure    ED Visit Averted N/A    Life-Saving Intervention Made N/A
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
## 2024-04-18 ENCOUNTER — Other Ambulatory Visit: Payer: Self-pay

## 2024-04-18 DIAGNOSIS — Y908 Blood alcohol level of 240 mg/100 ml or more: Secondary | ICD-10-CM | POA: Diagnosis not present

## 2024-04-18 DIAGNOSIS — R7989 Other specified abnormal findings of blood chemistry: Secondary | ICD-10-CM | POA: Diagnosis not present

## 2024-04-18 DIAGNOSIS — F10120 Alcohol abuse with intoxication, uncomplicated: Secondary | ICD-10-CM | POA: Diagnosis present

## 2024-04-18 LAB — URINALYSIS, ROUTINE W REFLEX MICROSCOPIC
Bilirubin Urine: NEGATIVE
Glucose, UA: NEGATIVE mg/dL
Hgb urine dipstick: NEGATIVE
Ketones, ur: NEGATIVE mg/dL
Leukocytes,Ua: NEGATIVE
Nitrite: NEGATIVE
Protein, ur: NEGATIVE mg/dL
Specific Gravity, Urine: 1.003 — ABNORMAL LOW (ref 1.005–1.030)
pH: 6 (ref 5.0–8.0)

## 2024-04-18 LAB — CBC
HCT: 36.2 % — ABNORMAL LOW (ref 39.0–52.0)
Hemoglobin: 12.6 g/dL — ABNORMAL LOW (ref 13.0–17.0)
MCH: 34.1 pg — ABNORMAL HIGH (ref 26.0–34.0)
MCHC: 34.8 g/dL (ref 30.0–36.0)
MCV: 97.8 fL (ref 80.0–100.0)
Platelets: 81 K/uL — ABNORMAL LOW (ref 150–400)
RBC: 3.7 MIL/uL — ABNORMAL LOW (ref 4.22–5.81)
RDW: 14.1 % (ref 11.5–15.5)
WBC: 5.5 K/uL (ref 4.0–10.5)
nRBC: 0 % (ref 0.0–0.2)

## 2024-04-18 NOTE — ED Triage Notes (Signed)
 [place] via [facility] for abd pain and swelling. Pt has distention in belly. Pt is caox4, in no acute distress. Pt is known to ED staff for ETOH.   18 R FA via EMS and 500.  Vitals WNL for EMS

## 2024-04-19 ENCOUNTER — Emergency Department
Admission: EM | Admit: 2024-04-19 | Discharge: 2024-04-19 | Disposition: A | Discharge status: Home / Self Care | Payer: MEDICAID | Attending: Emergency Medicine | Admitting: Emergency Medicine

## 2024-04-19 DIAGNOSIS — F10920 Alcohol use, unspecified with intoxication, uncomplicated: Secondary | ICD-10-CM

## 2024-04-19 LAB — COMPREHENSIVE METABOLIC PANEL WITH GFR
ALT: 151 U/L — ABNORMAL HIGH (ref 0–44)
AST: 272 U/L — ABNORMAL HIGH (ref 15–41)
Albumin: 3.4 g/dL — ABNORMAL LOW (ref 3.5–5.0)
Alkaline Phosphatase: 67 U/L (ref 38–126)
Anion gap: 13 (ref 5–15)
BUN: 9 mg/dL (ref 8–23)
CO2: 19 mmol/L — ABNORMAL LOW (ref 22–32)
Calcium: 9.4 mg/dL (ref 8.9–10.3)
Chloride: 104 mmol/L (ref 98–111)
Creatinine, Ser: 0.65 mg/dL (ref 0.61–1.24)
GFR, Estimated: >60 mL/min (ref >60)
Glucose, Bld: 101 mg/dL — ABNORMAL HIGH (ref 70–99)
Potassium: 3.6 mmol/L (ref 3.5–5.1)
Sodium: 136 mmol/L (ref 135–145)
Total Bilirubin: 1 mg/dL (ref 0.0–1.2)
Total Protein: 7.8 g/dL (ref 6.5–8.1)

## 2024-04-19 LAB — LIPASE, BLOOD: Lipase: 46 U/L (ref 11–51)

## 2024-04-19 LAB — ETHANOL: Alcohol, Ethyl (B): 273 mg/dL — ABNORMAL HIGH (ref <15)

## 2024-04-19 MED ORDER — ALUM & MAG HYDROXIDE-SIMETH 200-200-20 MG/5ML PO SUSP
30.0000 mL | Freq: Once | ORAL | Status: AC
  Administered 2024-04-19: 30 mL via ORAL
  Filled 2024-04-19: qty 30

## 2024-04-19 MED ORDER — LIDOCAINE VISCOUS HCL 2 % MT SOLN
15.0000 mL | Freq: Once | OROMUCOSAL | Status: AC
  Administered 2024-04-19: 15 mL via ORAL
  Filled 2024-04-19: qty 15

## 2024-04-19 MED ORDER — FAMOTIDINE 20 MG PO TABS
20.0000 mg | ORAL_TABLET | Freq: Once | ORAL | Status: AC
  Administered 2024-04-19: 20 mg via ORAL
  Filled 2024-04-19: qty 1

## 2024-04-19 MED ORDER — ONDANSETRON 4 MG PO TBDP
4.0000 mg | ORAL_TABLET | Freq: Once | ORAL | Status: AC
  Administered 2024-04-19: 4 mg via ORAL
  Filled 2024-04-19: qty 1
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
## 2024-04-19 NOTE — Discharge Instructions (Addendum)
 Thank you for choosing [place] for your health care today!  Please see your primary doctor this week for a follow up appointment.   If you have any new, worsening, or unexpected symptoms call your doctor right away or come back to the emergency department for reevaluation.  It was my pleasure to care for you today.   Daneil Dan Modesto Charon, MD

## 2024-04-19 NOTE — ED Provider Notes (Signed)
 [facility] Provider Note    Event Date/Time   First MD Initiated Contact with Patient 04/19/24 [phone]     (approximate)   History   Abdominal Pain and Alcohol Intoxication   HPI  Jose [name] is a 63 y.o. male   Past medical history of alcohol use, liver cirrhosis, polysubstance use, who presents with alcohol intoxication and abdominal pain.  He also notes that he has been peeing blood.  Shortly after checking in he went outside to smoke and he tried to leave by calling a cab for ride but could not obtain a ride so he came back and I would like to sleep until the morning get food.  On review of system he does have chronic abdominal discomfort, some distention chronically, and notes that he thinks he has been peeing blood.  There is no dysuria or frequency.  He reports drinking alcohol tonight.  He has no other acute medical complaints.  He does request made turning off the lights, letting him sleep, and giving him a sandwich.   External Medical Documents Reviewed: Prior hospital notes      Physical Exam   Triage Vital Signs: ED Triage Vitals  Encounter Vitals Group     BP 04/18/24 2324 136/86     Girls Systolic BP Percentile --      Girls Diastolic BP Percentile --      Boys Systolic BP Percentile --      Boys Diastolic BP Percentile --      Pulse Rate 04/18/24 2324 84     Resp 04/18/24 2324 16     Temp 04/18/24 2324 98.3 F (36.8 C)     Temp Source 04/18/24 2324 Oral     SpO2 04/18/24 2324 94 %     Weight 04/18/24 2323 150 lb (68 kg)     Height 04/18/24 2323 5' 8 (1.727 m)     Head Circumference --      Peak Flow --      Pain Score 04/18/24 2323 0     Pain Loc --      Pain Education --      Exclude from Growth Chart --     Most recent vital signs: Vitals:   04/18/24 2324  BP: 136/86  Pulse: 84  Resp: 16  Temp: 98.3 F (36.8 C)  SpO2: 94%    General: Awake, no distress.  CV:  Good peripheral perfusion.   Resp:  Normal effort.  Abd:  Mild distention, no focal tenderness rigidity or guarding.   ED Results / Procedures / Treatments   Labs (all labs ordered are listed, but only abnormal results are displayed) Labs Reviewed  COMPREHENSIVE METABOLIC PANEL WITH GFR - Abnormal; Notable for the following components:      Result Value   CO2 19 (*)    Glucose, Bld 101 (*)    Albumin  3.4 (*)    AST 272 (*)    ALT 151 (*)    All other components within normal limits  CBC - Abnormal; Notable for the following components:   RBC 3.70 (*)    Hemoglobin 12.6 (*)    HCT 36.2 (*)    MCH 34.1 (*)    Platelets 81 (*)    All other components within normal limits  URINALYSIS, ROUTINE W REFLEX MICROSCOPIC - Abnormal; Notable for the following components:   Color, Urine YELLOW (*)    APPearance CLEAR (*)    Specific Gravity, Urine 1.003 (*)  All other components within normal limits  ETHANOL - Abnormal; Notable for the following components:   Alcohol, Ethyl (B) 273 (*)    All other components within normal limits  LIPASE, BLOOD     I ordered and reviewed the above labs they are notable for no leukocytosis, H&H at baseline, cell counts electrolytes unremarkable, LFTs chronically elevated unchanged from prior.  PROCEDURES:  Critical Care performed: No  Procedures   MEDICATIONS ORDERED IN ED: Medications  ondansetron  (ZOFRAN -ODT) disintegrating tablet 4 mg (has no administration in time range)  alum & mag hydroxide-simeth (MAALOX/MYLANTA) 200-200-20 MG/5ML suspension 30 mL (has no administration in time range)    And  lidocaine  (XYLOCAINE ) 2 % viscous mouth solution 15 mL (has no administration in time range)    IMPRESSION / MDM / ASSESSMENT AND PLAN / ED COURSE  I reviewed the triage vital signs and the nursing notes.                                Patient's presentation is most consistent with acute presentation with potential threat to life or bodily function.  Differential  diagnosis includes, but is not limited to, alcohol intoxication, pancreatitis, alcoholic gastritis, ascites, cirrhotic liver, liver failure, urinary tract infection, renal stone   The patient is on the cardiac monitor to evaluate for evidence of arrhythmia and/or significant heart rate changes.  MDM:    Patient with alcohol use, here with chronic abdominal pain mostly requesting a place to rest and eat food.  Has a benign abdominal exam, some distention perhaps related to ascites with his known alcoholic cirrhosis but not likely SBP, infection, obstruction given very benign abdominal exam.  No respiratory distress and no respiratory complaints I do not think he needs a paracentesis at this time.  Will give a GI cocktail.  Check labs unremarkable aside from chronically elevated LFTs in light of his alcohol use, and no leukocytosis to suggest infection.  Urinalysis does not show blood in the urine or signs of infection.  He is stable and cooperative and can be discharged in the morning.         FINAL CLINICAL IMPRESSION(S) / ED DIAGNOSES   Final diagnoses:  Alcoholic intoxication without complication [facility])     Rx / DC Orders   ED Discharge Orders          Ordered    Ambulatory Referral to Primary Care (Establish Care)        04/19/24 0144             Note:  This document was prepared using Dragon voice recognition software and may include unintentional dictation errors.    Cyrena Mylar, MD 04/19/24 [phone]

## 2024-04-23 NOTE — Congregational Nurse Program (Signed)
  Dept: [phone]   Congregational Nurse Program Note  Date of Encounter: 04/23/2024 Client to [facility] day center, nurse led clinic with bee sting to his left eye. Eye lid and under eye present with swelling. No bruising. Client reports he was stung by a bee late yesterday. He did use ice. Cold pack applied during this visit. Swelling had improved by later in the morning. MARLA Marina BSN, RN Past Medical History: Past Medical History:  Diagnosis Date   Cocaine use    EtOH dependence (HCC)    Hep C w/o coma, chronic (HCC)    Hypertension    Liver cirrhosis, alcoholic (HCC)    Polysubstance abuse (HCC)    Pulmonary embolism [facility])     Encounter Details:  Community Questionnaire - 04/23/24 1013       Questionnaire   Ask client: Do you give verbal consent for me to treat you today? Yes    Student Assistance N/A    Location Patient Served  [facility]    Encounter Setting CN site    Population Status Unhoused    Insurance Medicaid   [place] Access   Insurance/Financial Assistance Referral N/A    Medication N/A    Medical Provider No   client was seen in July 2023, has not rescheduled follow up that was due in October   Screening Referrals Made N/A    Medical Referrals Made N/A    Medical Appointment Completed N/A    CNP Interventions Advocate/Support;Educate    Screenings CN Performed N/A    ED Visit Averted N/A    Life-Saving Intervention Made N/A

## 2024-04-25 ENCOUNTER — Other Ambulatory Visit: Payer: Self-pay

## 2024-04-25 ENCOUNTER — Emergency Department
Admission: EM | Admit: 2024-04-25 | Discharge: 2024-04-26 | Disposition: A | Discharge status: Home / Self Care | Payer: MEDICAID | Attending: Emergency Medicine | Admitting: Emergency Medicine

## 2024-04-25 DIAGNOSIS — F1010 Alcohol abuse, uncomplicated: Secondary | ICD-10-CM | POA: Diagnosis present

## 2024-04-25 DIAGNOSIS — R109 Unspecified abdominal pain: Secondary | ICD-10-CM | POA: Insufficient documentation

## 2024-04-25 DIAGNOSIS — Y907 Blood alcohol level of 200-239 mg/100 ml: Secondary | ICD-10-CM | POA: Insufficient documentation

## 2024-04-25 LAB — CBC
HCT: 38.8 % — ABNORMAL LOW (ref 39.0–52.0)
Hemoglobin: 13.4 g/dL (ref 13.0–17.0)
MCH: 33.8 pg (ref 26.0–34.0)
MCHC: 34.5 g/dL (ref 30.0–36.0)
MCV: 98 fL (ref 80.0–100.0)
Platelets: 69 K/uL — ABNORMAL LOW (ref 150–400)
RBC: 3.96 MIL/uL — ABNORMAL LOW (ref 4.22–5.81)
RDW: 13.6 % (ref 11.5–15.5)
WBC: 5.4 K/uL (ref 4.0–10.5)
nRBC: 0 % (ref 0.0–0.2)

## 2024-04-25 LAB — ETHANOL: Alcohol, Ethyl (B): 237 mg/dL — ABNORMAL HIGH (ref <15)

## 2024-04-25 LAB — COMPREHENSIVE METABOLIC PANEL WITH GFR
ALT: 161 U/L — ABNORMAL HIGH (ref 0–44)
AST: 303 U/L — ABNORMAL HIGH (ref 15–41)
Albumin: 3.5 g/dL (ref 3.5–5.0)
Alkaline Phosphatase: 73 U/L (ref 38–126)
Anion gap: 14 (ref 5–15)
BUN: 8 mg/dL (ref 8–23)
CO2: 21 mmol/L — ABNORMAL LOW (ref 22–32)
Calcium: 9.4 mg/dL (ref 8.9–10.3)
Chloride: 101 mmol/L (ref 98–111)
Creatinine, Ser: 0.65 mg/dL (ref 0.61–1.24)
GFR, Estimated: >60 mL/min (ref >60)
Glucose, Bld: 92 mg/dL (ref 70–99)
Potassium: 3.4 mmol/L — ABNORMAL LOW (ref 3.5–5.1)
Sodium: 136 mmol/L (ref 135–145)
Total Bilirubin: 1.3 mg/dL — ABNORMAL HIGH (ref 0.0–1.2)
Total Protein: 8.1 g/dL (ref 6.5–8.1)

## 2024-04-25 LAB — URINALYSIS, ROUTINE W REFLEX MICROSCOPIC
Bilirubin Urine: NEGATIVE
Glucose, UA: NEGATIVE mg/dL
Hgb urine dipstick: NEGATIVE
Ketones, ur: NEGATIVE mg/dL
Leukocytes,Ua: NEGATIVE
Nitrite: NEGATIVE
Protein, ur: NEGATIVE mg/dL
Specific Gravity, Urine: 1.003 — ABNORMAL LOW (ref 1.005–1.030)
pH: 6 (ref 5.0–8.0)

## 2024-04-25 LAB — LIPASE, BLOOD: Lipase: 50 U/L (ref 11–51)

## 2024-04-25 MED ORDER — SODIUM CHLORIDE 0.9 % IV SOLN
Freq: Once | INTRAVENOUS | Status: AC
  Administered 2024-04-25: 1000 mL via INTRAVENOUS

## 2024-04-25 NOTE — ED Triage Notes (Signed)
 Pt BIB [facility] for generalized abdominal pain. Pt is homeless and well-known to [employer]. Pt called EMS because he states that he has liver pain x 4 months. Pt admits to daily ETOH use.

## 2024-04-25 NOTE — ED Provider Notes (Signed)
 [facility] Provider Note    Event Date/Time   First MD Initiated Contact with Patient 04/25/24 2303     (approximate)   History   Abdominal Pain   HPI  Jose [name] is a 63 y.o. male with a history of polysubstance abuse, alcohol use disorder, homelessness, hepatitis C, cirrhosis who presents with complaints of abdominal discomfort.  This is a somewhat chronic complaint for him.  He also reports that he feels dehydrated and thinks he may need some fluids.  He does admit to significant alcohol use.  He also reports that he needs a place to sleep.  Review of records demonstrates was here last on July 27 for similar complaint.     Physical Exam   Triage Vital Signs: ED Triage Vitals  Encounter Vitals Group     BP 04/25/24 2243 (!) 133/90     Girls Systolic BP Percentile --      Girls Diastolic BP Percentile --      Boys Systolic BP Percentile --      Boys Diastolic BP Percentile --      Pulse Rate 04/25/24 2243 (!) 103     Resp 04/25/24 2243 16     Temp 04/25/24 2243 97.9 F (36.6 C)     Temp Source 04/25/24 2243 Oral     SpO2 04/25/24 2243 98 %     Weight --      Height --      Head Circumference --      Peak Flow --      Pain Score 04/25/24 2242 10     Pain Loc --      Pain Education --      Exclude from Growth Chart --     Most recent vital signs: Vitals:   04/26/24 0000 04/26/24 0030  BP: (!) 176/99 (!) 168/94  Pulse: 100 78  Resp: 16 18  Temp:    SpO2: 98% 100%     General: Awake, no distress.  Smells of alcohol CV:  Good peripheral perfusion.  Mild tachycardia Resp:  Normal effort.  Clear to auscultation bilaterally Abd:  Mild distention in keeping with his diagnosis of cirrhosis, no tenderness to palpation Other:     ED Results / Procedures / Treatments   Labs (all labs ordered are listed, but only abnormal results are displayed) Labs Reviewed  COMPREHENSIVE METABOLIC PANEL WITH GFR - Abnormal; Notable for the  following components:      Result Value   Potassium 3.4 (*)    CO2 21 (*)    AST 303 (*)    ALT 161 (*)    Total Bilirubin 1.3 (*)    All other components within normal limits  CBC - Abnormal; Notable for the following components:   RBC 3.96 (*)    HCT 38.8 (*)    Platelets 69 (*)    All other components within normal limits  URINALYSIS, ROUTINE W REFLEX MICROSCOPIC - Abnormal; Notable for the following components:   Color, Urine YELLOW (*)    APPearance CLEAR (*)    Specific Gravity, Urine 1.003 (*)    All other components within normal limits  ETHANOL - Abnormal; Notable for the following components:   Alcohol, Ethyl (B) 237 (*)    All other components within normal limits  LIPASE, BLOOD     EKG     RADIOLOGY     PROCEDURES:  Critical Care performed:   Procedures   MEDICATIONS ORDERED  IN ED: Medications  0.9 %  sodium chloride  infusion (0 mLs Intravenous Stopped 04/26/24 0010)     IMPRESSION / MDM / ASSESSMENT AND PLAN / ED COURSE  I reviewed the triage vital signs and the nursing notes. Patient's presentation is most consistent with severe exacerbation of chronic illness.  Patient presents with complaints as above, known to our department with frequent presentations similar to today.  Overall he appears to be at his baseline, he has mildly tachycardic which I suspect is related to alcohol use but could be related to dehydration.  Labs pending, will treat with IV fluids and reevaluate.  Reassuring abdominal exam, no indication for imaging at this time.  ----------------------------------------- 1:34 AM on 04/26/2024 ----------------------------------------- Lab work not significantly changed from prior, patient feeling improved after fluids, appropriate for discharge, no indication for admission      FINAL CLINICAL IMPRESSION(S) / ED DIAGNOSES   Final diagnoses:  Alcohol abuse     Rx / DC Orders   ED Discharge Orders     None        Note:   This document was prepared using Dragon voice recognition software and may include unintentional dictation errors.   Arlander Charleston, MD 04/26/24 [phone]

## 2024-04-26 NOTE — ED Notes (Signed)
 RN to bedside to answer call bed. Pt needed to use the bathroom. Pt ambulated to toilet and back to bed.

## 2024-05-12 NOTE — Congregational Nurse Program (Signed)
  Dept: [phone]   Congregational Nurse Program Note  Date of Encounter: 05/12/2024 Client to [facility] day center, nurse led clinic, with request for foot care. Foot care provided. No open areas, blisters or Athlete's foot noted. Emotional support given. MARLA Marina BSN, RN Past Medical History: Past Medical History:  Diagnosis Date   Cocaine use    EtOH dependence (HCC)    Hep C w/o coma, chronic (HCC)    Hypertension    Liver cirrhosis, alcoholic (HCC)    Polysubstance abuse (HCC)    Pulmonary embolism (HCC)     Encounter Details:  Community Questionnaire - 05/12/24 1140       Questionnaire   Ask client: Do you give verbal consent for me to treat you today? Yes    Student Assistance N/A    Location Patient Served  [facility]    Encounter Setting CN site    Population Status Unhoused    Insurance Medicaid   [place] Access   Insurance/Financial Assistance Referral N/A    Medication N/A    Medical Provider No   client was seen in July 2023, has not rescheduled follow up that was due in October   Screening Referrals Made N/A    Medical Referrals Made N/A    Medical Appointment Completed N/A    CNP Interventions Advocate/Support;Educate    Screenings CN Performed N/A    ED Visit Averted N/A    Life-Saving Intervention Made N/A

## 2024-05-19 NOTE — Congregational Nurse Program (Signed)
  Dept: [phone]   Congregational Nurse Program Note  Date of Encounter: 05/19/2024 Client to [facility] day center , nurse led clinic with request for foot care. Toe nails trimmed.  No redness or open areas noted. No other needs at this time. Jose [name] BSN, RN Past Medical History: Past Medical History:  Diagnosis Date   Cocaine use    EtOH dependence (HCC)    Hep C w/o coma, chronic (HCC)    Hypertension    Liver cirrhosis, alcoholic (HCC)    Polysubstance abuse (HCC)    Pulmonary embolism [facility])     Encounter Details:  Community Questionnaire - 05/19/24 1341       Questionnaire   Ask client: Do you give verbal consent for me to treat you today? Yes    Student Assistance N/A    Location Patient Served  [facility]    Encounter Setting CN site    Population Status Unhoused    Insurance Medicaid   [place] Access   Insurance/Financial Assistance Referral N/A    Medication N/A    Medical Provider No   client was seen in July 2023, has not rescheduled follow up that was due in October   Screening Referrals Made N/A    Medical Referrals Made N/A    Medical Appointment Completed N/A    CNP Interventions Advocate/Support;Educate    Screenings CN Performed N/A    ED Visit Averted N/A    Life-Saving Intervention Made N/A

## 2024-05-21 NOTE — Congregational Nurse Program (Signed)
  Dept: [phone]   Congregational Nurse Program Note  Date of Encounter: 05/21/2024 Client to [facility] day center, nurse led clinic, with c/o numbness to the left side of his face and left arm. He reports this started 3-4 days ago. No further progression. No headache or chest pain. Grips unequal with left sightly weaker than right. No changes in vision, no slurred speech. Client has very slight downward turn to the left corner of his mouth. RN checked sensation to the left side of his face, on the cheek and and forehead, client reported he could not feel the sharp touch. BP (!) 142/82 (BP Location: Left Arm, Patient Position: Sitting, Cuff Size: Normal)   Pulse 88   SpO2 98%.  Client is well known to this RN and has been to [facility] [place] every day this week, but has not mentioned these symptoms until today. RN advised client that he could go to the [facility] ER for further evaluation. MARLA Marina BSN, RN  Past Medical History: Past Medical History:  Diagnosis Date   Cocaine use    EtOH dependence (HCC)    Hep C w/o coma, chronic (HCC)    Hypertension    Liver cirrhosis, alcoholic (HCC)    Polysubstance abuse (HCC)    Pulmonary embolism (HCC)     Encounter Details:  Community Questionnaire - 05/21/24 0900       Questionnaire   Ask client: Do you give verbal consent for me to treat you today? Yes    Student Assistance N/A    Location Patient Served  [facility]    Encounter Setting CN site    Population Status Unhoused    Insurance Medicaid   [place] Access   Insurance/Financial Assistance Referral N/A    Medication N/A    Medical Provider No   client was seen in July 2023, has not rescheduled follow up that was due in October   Screening Referrals Made N/A    Medical Referrals Made N/A    Medical Appointment Completed N/A    CNP Interventions Advocate/Support;Educate    Screenings CN Performed Blood Pressure    ED Visit Averted N/A    Life-Saving Intervention Made N/A

## 2024-05-22 ENCOUNTER — Inpatient Hospital Stay
Admission: EM | Admit: 2024-05-22 | Discharge: 2024-05-23 | DRG: 065 | Disposition: A | Discharge status: Home / Self Care | Payer: MEDICAID | Attending: Internal Medicine | Admitting: Internal Medicine

## 2024-05-22 ENCOUNTER — Inpatient Hospital Stay: Admit: 2024-05-22 | Discharge: 2024-05-22 | Disposition: A | Discharge status: Home / Self Care | Payer: MEDICAID

## 2024-05-22 ENCOUNTER — Emergency Department: Payer: MEDICAID

## 2024-05-22 ENCOUNTER — Other Ambulatory Visit: Payer: Self-pay

## 2024-05-22 DIAGNOSIS — R531 Weakness: Principal | ICD-10-CM

## 2024-05-22 DIAGNOSIS — Z5948 Other specified lack of adequate food: Secondary | ICD-10-CM | POA: Diagnosis not present

## 2024-05-22 DIAGNOSIS — F1010 Alcohol abuse, uncomplicated: Secondary | ICD-10-CM

## 2024-05-22 DIAGNOSIS — F17200 Nicotine dependence, unspecified, uncomplicated: Secondary | ICD-10-CM | POA: Diagnosis not present

## 2024-05-22 DIAGNOSIS — G459 Transient cerebral ischemic attack, unspecified: Secondary | ICD-10-CM | POA: Diagnosis not present

## 2024-05-22 DIAGNOSIS — I10 Essential (primary) hypertension: Secondary | ICD-10-CM | POA: Diagnosis present

## 2024-05-22 DIAGNOSIS — I739 Peripheral vascular disease, unspecified: Secondary | ICD-10-CM

## 2024-05-22 DIAGNOSIS — G8194 Hemiplegia, unspecified affecting left nondominant side: Secondary | ICD-10-CM | POA: Diagnosis present

## 2024-05-22 DIAGNOSIS — F1410 Cocaine abuse, uncomplicated: Secondary | ICD-10-CM | POA: Diagnosis present

## 2024-05-22 DIAGNOSIS — Z87820 Personal history of traumatic brain injury: Secondary | ICD-10-CM | POA: Diagnosis not present

## 2024-05-22 DIAGNOSIS — I4891 Unspecified atrial fibrillation: Secondary | ICD-10-CM | POA: Diagnosis present

## 2024-05-22 DIAGNOSIS — F1590 Other stimulant use, unspecified, uncomplicated: Secondary | ICD-10-CM | POA: Diagnosis present

## 2024-05-22 DIAGNOSIS — K7030 Alcoholic cirrhosis of liver without ascites: Secondary | ICD-10-CM | POA: Diagnosis present

## 2024-05-22 DIAGNOSIS — I6523 Occlusion and stenosis of bilateral carotid arteries: Secondary | ICD-10-CM

## 2024-05-22 DIAGNOSIS — Z5941 Food insecurity: Secondary | ICD-10-CM | POA: Diagnosis not present

## 2024-05-22 DIAGNOSIS — I7090 Unspecified atherosclerosis: Secondary | ICD-10-CM | POA: Diagnosis not present

## 2024-05-22 DIAGNOSIS — Y906 Blood alcohol level of 120-199 mg/100 ml: Secondary | ICD-10-CM | POA: Diagnosis present

## 2024-05-22 DIAGNOSIS — I639 Cerebral infarction, unspecified: Secondary | ICD-10-CM

## 2024-05-22 DIAGNOSIS — Z5900 Homelessness unspecified: Secondary | ICD-10-CM

## 2024-05-22 DIAGNOSIS — F10239 Alcohol dependence with withdrawal, unspecified: Secondary | ICD-10-CM | POA: Diagnosis present

## 2024-05-22 DIAGNOSIS — R29810 Facial weakness: Secondary | ICD-10-CM | POA: Diagnosis present

## 2024-05-22 DIAGNOSIS — E859 Amyloidosis, unspecified: Secondary | ICD-10-CM | POA: Diagnosis not present

## 2024-05-22 DIAGNOSIS — F10939 Alcohol use, unspecified with withdrawal, unspecified: Secondary | ICD-10-CM | POA: Diagnosis present

## 2024-05-22 DIAGNOSIS — Z86711 Personal history of pulmonary embolism: Secondary | ICD-10-CM | POA: Insufficient documentation

## 2024-05-22 DIAGNOSIS — I6503 Occlusion and stenosis of bilateral vertebral arteries: Secondary | ICD-10-CM

## 2024-05-22 DIAGNOSIS — F17210 Nicotine dependence, cigarettes, uncomplicated: Secondary | ICD-10-CM | POA: Diagnosis present

## 2024-05-22 DIAGNOSIS — I87309 Chronic venous hypertension (idiopathic) without complications of unspecified lower extremity: Secondary | ICD-10-CM | POA: Diagnosis not present

## 2024-05-22 DIAGNOSIS — I680 Cerebral amyloid angiopathy: Secondary | ICD-10-CM

## 2024-05-22 DIAGNOSIS — E43 Unspecified severe protein-calorie malnutrition: Secondary | ICD-10-CM

## 2024-05-22 DIAGNOSIS — R200 Anesthesia of skin: Secondary | ICD-10-CM

## 2024-05-22 DIAGNOSIS — B1920 Unspecified viral hepatitis C without hepatic coma: Secondary | ICD-10-CM | POA: Diagnosis present

## 2024-05-22 DIAGNOSIS — F10920 Alcohol use, unspecified with intoxication, uncomplicated: Secondary | ICD-10-CM | POA: Diagnosis not present

## 2024-05-22 DIAGNOSIS — I6381 Other cerebral infarction due to occlusion or stenosis of small artery: Principal | ICD-10-CM | POA: Diagnosis present

## 2024-05-22 DIAGNOSIS — I4510 Unspecified right bundle-branch block: Secondary | ICD-10-CM | POA: Diagnosis present

## 2024-05-22 DIAGNOSIS — F1090 Alcohol use, unspecified, uncomplicated: Secondary | ICD-10-CM | POA: Diagnosis present

## 2024-05-22 DIAGNOSIS — Z930 Tracheostomy status: Secondary | ICD-10-CM

## 2024-05-22 LAB — COMPREHENSIVE METABOLIC PANEL WITH GFR
ALT: 127 U/L — ABNORMAL HIGH (ref 0–44)
AST: 221 U/L — ABNORMAL HIGH (ref 15–41)
Albumin: 3.8 g/dL (ref 3.5–5.0)
Alkaline Phosphatase: 70 U/L (ref 38–126)
Anion gap: 11 (ref 5–15)
BUN: 7 mg/dL — ABNORMAL LOW (ref 8–23)
CO2: 22 mmol/L (ref 22–32)
Calcium: 9.9 mg/dL (ref 8.9–10.3)
Chloride: 103 mmol/L (ref 98–111)
Creatinine, Ser: 0.71 mg/dL (ref 0.61–1.24)
GFR, Estimated: >60 mL/min (ref >60)
Glucose, Bld: 119 mg/dL — ABNORMAL HIGH (ref 70–99)
Potassium: 3.8 mmol/L (ref 3.5–5.1)
Sodium: 136 mmol/L (ref 135–145)
Total Bilirubin: 0.9 mg/dL (ref 0.0–1.2)
Total Protein: 8.6 g/dL — ABNORMAL HIGH (ref 6.5–8.1)

## 2024-05-22 LAB — CBC WITH DIFFERENTIAL/PLATELET
Abs Immature Granulocytes: 0.03 K/uL (ref 0.00–0.07)
Basophils Absolute: 0 K/uL (ref 0.0–0.1)
Basophils Relative: 1 %
Eosinophils Absolute: 0 K/uL (ref 0.0–0.5)
Eosinophils Relative: 1 %
HCT: 40.3 % (ref 39.0–52.0)
Hemoglobin: 13.9 g/dL (ref 13.0–17.0)
Immature Granulocytes: 1 %
Lymphocytes Relative: 34 %
Lymphs Abs: 2.2 K/uL (ref 0.7–4.0)
MCH: 34.4 pg — ABNORMAL HIGH (ref 26.0–34.0)
MCHC: 34.5 g/dL (ref 30.0–36.0)
MCV: 99.8 fL (ref 80.0–100.0)
Monocytes Absolute: 0.6 K/uL (ref 0.1–1.0)
Monocytes Relative: 10 %
Neutro Abs: 3.5 K/uL (ref 1.7–7.7)
Neutrophils Relative %: 53 %
Platelets: 77 K/uL — ABNORMAL LOW (ref 150–400)
RBC: 4.04 MIL/uL — ABNORMAL LOW (ref 4.22–5.81)
RDW: 13.2 % (ref 11.5–15.5)
WBC: 6.4 K/uL (ref 4.0–10.5)
nRBC: 0 % (ref 0.0–0.2)

## 2024-05-22 LAB — URINALYSIS, ROUTINE W REFLEX MICROSCOPIC
Bilirubin Urine: NEGATIVE
Glucose, UA: NEGATIVE mg/dL
Hgb urine dipstick: NEGATIVE
Ketones, ur: NEGATIVE mg/dL
Leukocytes,Ua: NEGATIVE
Nitrite: NEGATIVE
Protein, ur: NEGATIVE mg/dL
Specific Gravity, Urine: 1.004 — ABNORMAL LOW (ref 1.005–1.030)
pH: 6 (ref 5.0–8.0)

## 2024-05-22 LAB — ECHOCARDIOGRAM COMPLETE
AR max vel: 1.47 cm2
AV Area VTI: 1.44 cm2
AV Area mean vel: 1.31 cm2
AV Mean grad: 13 mmHg
AV Peak grad: 22.6 mmHg
Ao pk vel: 2.38 m/s
Area-P 1/2: 3.39 cm2
S' Lateral: 2.6 cm
Weight: 2400 [oz_av]

## 2024-05-22 LAB — MAGNESIUM: Magnesium: 1.6 mg/dL — ABNORMAL LOW (ref 1.7–2.4)

## 2024-05-22 LAB — URINE DRUG SCREEN, QUALITATIVE (ARMC ONLY)
Amphetamines, Ur Screen: NOT DETECTED
Barbiturates, Ur Screen: NOT DETECTED
Benzodiazepine, Ur Scrn: NOT DETECTED
Cannabinoid 50 Ng, Ur ~~LOC~~: NOT DETECTED
Cocaine Metabolite,Ur ~~LOC~~: POSITIVE — AB
MDMA (Ecstasy)Ur Screen: NOT DETECTED
Methadone Scn, Ur: NOT DETECTED
Opiate, Ur Screen: NOT DETECTED
Phencyclidine (PCP) Ur S: NOT DETECTED
Tricyclic, Ur Screen: NOT DETECTED

## 2024-05-22 LAB — CBC
HCT: 36.5 % — ABNORMAL LOW (ref 39.0–52.0)
Hemoglobin: 12.6 g/dL — ABNORMAL LOW (ref 13.0–17.0)
MCH: 34.1 pg — ABNORMAL HIGH (ref 26.0–34.0)
MCHC: 34.5 g/dL (ref 30.0–36.0)
MCV: 98.9 fL (ref 80.0–100.0)
Platelets: 65 K/uL — ABNORMAL LOW (ref 150–400)
RBC: 3.69 MIL/uL — ABNORMAL LOW (ref 4.22–5.81)
RDW: 13 % (ref 11.5–15.5)
WBC: 4.3 K/uL (ref 4.0–10.5)
nRBC: 0 % (ref 0.0–0.2)

## 2024-05-22 LAB — PHOSPHORUS: Phosphorus: 3.6 mg/dL (ref 2.5–4.6)

## 2024-05-22 LAB — APTT: aPTT: 32 s (ref 24–36)

## 2024-05-22 LAB — CREATININE, SERUM
Creatinine, Ser: 0.65 mg/dL (ref 0.61–1.24)
GFR, Estimated: >60 mL/min (ref >60)

## 2024-05-22 LAB — ETHANOL: Alcohol, Ethyl (B): 128 mg/dL — ABNORMAL HIGH (ref <15)

## 2024-05-22 LAB — CBG MONITORING, ED: Glucose-Capillary: 116 mg/dL — ABNORMAL HIGH (ref 70–99)

## 2024-05-22 LAB — HIV ANTIBODY (ROUTINE TESTING W REFLEX): HIV Screen 4th Generation wRfx: NONREACTIVE

## 2024-05-22 LAB — PROTIME-INR
INR: 1.2 (ref 0.8–1.2)
Prothrombin Time: 15.6 s — ABNORMAL HIGH (ref 11.4–15.2)

## 2024-05-22 MED ORDER — ASPIRIN 300 MG RE SUPP: 300.0000 mg | Freq: Every day | RECTAL | Status: DC

## 2024-05-22 MED ORDER — AMLODIPINE BESYLATE 5 MG PO TABS
5.0000 mg | ORAL_TABLET | Freq: Every day | ORAL | Status: DC
  Administered 2024-05-22 – 2024-05-23 (×2): 5 mg via ORAL
  Filled 2024-05-22 (×2): qty 1

## 2024-05-22 MED ORDER — FOLIC ACID 1 MG PO TABS: 1.0000 mg | ORAL_TABLET | Freq: Every day | ORAL | Status: DC

## 2024-05-22 MED ORDER — FOLIC ACID 1 MG PO TABS
1.0000 mg | ORAL_TABLET | Freq: Every day | ORAL | Status: DC
Start: 2024-05-22 — End: 2024-05-23
  Administered 2024-05-22 – 2024-05-23 (×2): 1 mg via ORAL
  Filled 2024-05-22 (×2): qty 1

## 2024-05-22 MED ORDER — LORAZEPAM 2 MG/ML IJ SOLN: 1.0000 mg | INTRAMUSCULAR | Status: DC | PRN

## 2024-05-22 MED ORDER — STROKE: EARLY STAGES OF RECOVERY BOOK: Freq: Once | Status: AC

## 2024-05-22 MED ORDER — LORAZEPAM 1 MG PO TABS
1.0000 mg | ORAL_TABLET | ORAL | Status: DC | PRN
  Administered 2024-05-23: 2 mg via ORAL

## 2024-05-22 MED ORDER — THIAMINE MONONITRATE 100 MG PO TABS
100.0000 mg | ORAL_TABLET | Freq: Every day | ORAL | Status: DC
  Administered 2024-05-22 – 2024-05-23 (×2): 100 mg via ORAL
  Filled 2024-05-22 (×2): qty 1

## 2024-05-22 MED ORDER — ENOXAPARIN SODIUM 40 MG/0.4ML IJ SOSY
40.0000 mg | PREFILLED_SYRINGE | INTRAMUSCULAR | Status: DC
  Administered 2024-05-22 – 2024-05-23 (×2): 40 mg via SUBCUTANEOUS
  Filled 2024-05-22 (×2): qty 0.4

## 2024-05-22 MED ORDER — ASPIRIN 325 MG PO TABS
325.0000 mg | ORAL_TABLET | Freq: Every day | ORAL | Status: DC
  Administered 2024-05-22 – 2024-05-23 (×2): 325 mg via ORAL
  Filled 2024-05-22 (×2): qty 1

## 2024-05-22 MED ORDER — ADULT MULTIVITAMIN W/MINERALS CH: 1.0000 | ORAL_TABLET | Freq: Every day | ORAL | Status: DC

## 2024-05-22 MED ORDER — THIAMINE MONONITRATE 100 MG PO TABS: 100.0000 mg | ORAL_TABLET | Freq: Every day | ORAL | Status: DC

## 2024-05-22 MED ORDER — POLYETHYLENE GLYCOL 3350 17 G PO PACK: 17.0000 g | PACK | Freq: Every day | ORAL | Status: DC | PRN

## 2024-05-22 MED ORDER — ADULT MULTIVITAMIN W/MINERALS CH
1.0000 | ORAL_TABLET | Freq: Every day | ORAL | Status: DC
Start: 2024-05-22 — End: 2024-05-23
  Administered 2024-05-22 – 2024-05-23 (×2): 1 via ORAL
  Filled 2024-05-22 (×2): qty 1

## 2024-05-22 MED ORDER — ACETAMINOPHEN 325 MG PO TABS
650.0000 mg | ORAL_TABLET | Freq: Once | ORAL | Status: AC
  Administered 2024-05-22: 650 mg via ORAL
  Filled 2024-05-22: qty 2

## 2024-05-22 MED ORDER — IOHEXOL 350 MG/ML SOLN
75.0000 mL | Freq: Once | INTRAVENOUS | Status: AC | PRN
  Administered 2024-05-22: 75 mL via INTRAVENOUS

## 2024-05-22 MED ORDER — THIAMINE HCL 100 MG/ML IJ SOLN: 100.0000 mg | Freq: Every day | INTRAMUSCULAR | Status: DC

## 2024-05-22 MED ORDER — MORPHINE SULFATE (PF) 2 MG/ML IV SOLN
2.0000 mg | INTRAVENOUS | Status: DC | PRN
  Administered 2024-05-22 – 2024-05-23 (×4): 2 mg via INTRAVENOUS
  Filled 2024-05-22 (×4): qty 1

## 2024-05-22 MED ORDER — LORAZEPAM 1 MG PO TABS
1.0000 mg | ORAL_TABLET | ORAL | Status: DC | PRN
  Filled 2024-05-22: qty 2

## 2024-05-22 MED ORDER — MAGNESIUM SULFATE 2 GM/50ML IV SOLN
2.0000 g | Freq: Once | INTRAVENOUS | Status: AC
  Administered 2024-05-22: 2 g via INTRAVENOUS
  Filled 2024-05-22: qty 50

## 2024-05-22 MED ORDER — IBUPROFEN 600 MG PO TABS
600.0000 mg | ORAL_TABLET | Freq: Once | ORAL | Status: AC
  Administered 2024-05-22: 600 mg via ORAL
  Filled 2024-05-22: qty 1

## 2024-05-22 MED ORDER — PANTOPRAZOLE SODIUM 40 MG PO TBEC
40.0000 mg | DELAYED_RELEASE_TABLET | Freq: Every day | ORAL | Status: DC
  Administered 2024-05-22 – 2024-05-23 (×2): 40 mg via ORAL
  Filled 2024-05-22 (×2): qty 1

## 2024-05-22 MED ORDER — ACETAMINOPHEN 650 MG RE SUPP: 650.0000 mg | Freq: Four times a day (QID) | RECTAL | Status: DC | PRN

## 2024-05-22 MED ORDER — ACETAMINOPHEN 325 MG PO TABS: 650.0000 mg | ORAL_TABLET | Freq: Four times a day (QID) | ORAL | Status: DC | PRN

## 2024-05-22 NOTE — ED Triage Notes (Addendum)
 Pt with left sided weakness and left arm/face numbness x4 days. Endorsing drooling from th left side of mouth. No speech changes per pt and EMS. Daily EToH user (last drink approx 0030).

## 2024-05-22 NOTE — Plan of Care (Signed)
  Problem: Self-Care: Goal: Ability to participate in self-care as condition permits will improve Outcome: Progressing Goal: Ability to communicate needs accurately will improve Outcome: Progressing   Problem: Nutrition: Goal: Risk of aspiration will decrease Outcome: Progressing Goal: Dietary intake will improve Outcome: Progressing   Problem: Activity: Goal: Risk for activity intolerance will decrease Outcome: Progressing   Problem: Nutrition: Goal: Adequate nutrition will be maintained Outcome: Progressing   Problem: Skin Integrity: Goal: Risk for impaired skin integrity will decrease Outcome: Progressing

## 2024-05-22 NOTE — Consult Note (Addendum)
 NEUROLOGY CONSULT NOTE   Date of service: May 22, 2024 Patient Name: Jose [name] MRN:  [id number] DOB:  1961-03-09 Chief Complaint: Left face, arm and leg numbness x 4 days Requesting Provider: Roann Gouty, MD  History of Present Illness  Jose [name] is a 63 y.o. homeless male with a PMHx of cocaine use, EtOH dependence, current smoker, chronic hepatitis C, HTN, alcoholic liver cirrhosis, and pulmonary embolism (formerly on Eliquis , which was discontinued by his physician approximately 4 months ago) who presented to the ED early this AM with new onset of left face, arm and leg numbness that began 4 days PTA. He also endorsed drooling from the left side of his mouth. He denied any aphasia. He is a daily heavy EtOH user with last drink about 5 hours prior to presentation.     ROS  No headache. Positive for blurry vision. No recent trauma. Other ROS as per HPI.   Past History   Past Medical History:  Diagnosis Date   Cocaine use    EtOH dependence (HCC)    Hep C w/o coma, chronic (HCC)    Hypertension    Liver cirrhosis, alcoholic (HCC)    Polysubstance abuse (HCC)    Pulmonary embolism (HCC)     Past Surgical History:  Procedure Laterality Date   ESOPHAGOGASTRODUODENOSCOPY (EGD) WITH PROPOFOL  N/A 03/02/2019   Procedure: ESOPHAGOGASTRODUODENOSCOPY (EGD) WITH PROPOFOL ;  Surgeon: Sebastian Moles, MD;  Location: [facility] ENDOSCOPY;  Service: General;  Laterality: N/A;   IR GASTROSTOMY TUBE REMOVAL  05/04/2019   PEG PLACEMENT N/A 03/02/2019   Procedure: PERCUTANEOUS ENDOSCOPIC GASTROSTOMY (PEG) PLACEMENT;  Surgeon: Sebastian Moles, MD;  Location: [facility] ENDOSCOPY;  Service: General;  Laterality: N/A;   PERCUTANEOUS TRACHEOSTOMY N/A 02/12/2019   Procedure: PERCUTANEOUS TRACHEOSTOMY;  Surgeon: Sebastian Moles, MD;  Location: [facility] OR;  Service: General;  Laterality: N/A;    Family History: History reviewed. No pertinent family history.  Social History  reports that he has been  smoking cigarettes. He has a 22.5 pack-year smoking history. He has never used smokeless tobacco. He reports current alcohol use of about 2.0 standard drinks of alcohol per week. He reports current drug use. Drugs: Marijuana and Cocaine.  No Known Allergies  Medications   Current Facility-Administered Medications:    [START ON 05/23/2024]  stroke: early stages of recovery book, , Does not apply, Once, Paudel, Keshab, MD   acetaminophen  (TYLENOL ) tablet 650 mg, 650 mg, Oral, Q6H PRN **OR** acetaminophen  (TYLENOL ) suppository 650 mg, 650 mg, Rectal, Q6H PRN, Paudel, Keshab, MD   amLODipine  (NORVASC ) tablet 5 mg, 5 mg, Oral, Daily, Paudel, Keshab, MD, 5 mg at 05/22/24 1008   aspirin  suppository 300 mg, 300 mg, Rectal, Daily **OR** aspirin  tablet 325 mg, 325 mg, Oral, Daily, Paudel, Keshab, MD, 325 mg at 05/22/24 1008   enoxaparin  (LOVENOX ) injection 40 mg, 40 mg, Subcutaneous, Q24H, Paudel, Keshab, MD, 40 mg at 05/22/24 9065   folic acid  (FOLVITE ) tablet 1 mg, 1 mg, Oral, Daily, Cyrena Mylar, MD, 1 mg at 05/22/24 9065   LORazepam  (ATIVAN ) tablet 1-4 mg, 1-4 mg, Oral, Q1H PRN **OR** LORazepam  (ATIVAN ) injection 1-4 mg, 1-4 mg, Intravenous, Q1H PRN, Cyrena Mylar, MD   LORazepam  (ATIVAN ) tablet 1-4 mg, 1-4 mg, Oral, Q1H PRN **OR** LORazepam  (ATIVAN ) injection 1-4 mg, 1-4 mg, Intravenous, Q1H PRN, Paudel, Keshab, MD   morphine  (PF) 2 MG/ML injection 2 mg, 2 mg, Intravenous, Q4H PRN, Paudel, Keshab, MD, 2 mg at 05/22/24 1946   multivitamin with minerals tablet  1 tablet, 1 tablet, Oral, Daily, Cyrena Mylar, MD, 1 tablet at 05/23/2024 9065   pantoprazole  (PROTONIX ) EC tablet 40 mg, 40 mg, Oral, Daily, Paudel, Keshab, MD, 40 mg at 05-23-2024 1008   polyethylene glycol (MIRALAX  / GLYCOLAX ) packet 17 g, 17 g, Oral, Daily PRN, Paudel, Keshab, MD   thiamine  (VITAMIN B1) tablet 100 mg, 100 mg, Oral, Daily, 100 mg at May 23, 2024 0934 **OR** thiamine  (VITAMIN B1) injection 100 mg, 100 mg, Intravenous, Daily, Cyrena Mylar,  MD  No current facility-administered medications on file prior to encounter.   Current Outpatient Medications on File Prior to Encounter  Medication Sig Dispense Refill   albuterol  (VENTOLIN  HFA) 108 (90 Base) MCG/ACT inhaler Inhale 2 puffs into the lungs every 6 (six) hours as needed for wheezing or shortness of breath. 18 g 2   amLODipine  (NORVASC ) 5 MG tablet Take 1 tablet (5 mg total) by mouth daily. (Patient not taking: Reported on 01/03/2024) 30 tablet 1   apixaban  (ELIQUIS ) 5 MG TABS tablet Take 1 tablet (5 mg total) by mouth 2 (two) times daily. (Patient not taking: Reported on 12/03/2022) 60 tablet 1   ibuprofen  (ADVIL ) 800 MG tablet Take 1 tablet (800 mg total) by mouth every 8 (eight) hours as needed. (Patient not taking: Reported on 01/03/2024) 30 tablet 0   ibuprofen  (ADVIL ) 800 MG tablet Take 1 tablet (800 mg total) by mouth every 8 (eight) hours as needed. (Patient not taking: Reported on 01/03/2024) 30 tablet 0   pantoprazole  (PROTONIX ) 40 MG tablet Take 1 tablet (40 mg total) by mouth daily. (Patient not taking: Reported on 01/03/2024) 90 tablet 3  No longer taking apixaban .    Vitals   Vitals:   05-23-24 1030 2024/05/23 1100 2024-05-23 1537 05-23-2024 1955  BP: 132/77 131/71 128/74 133/81  Pulse:   80 77  Resp: 18 18 16    Temp:   98.4 F (36.9 C) 98.1 F (36.7 C)  TempSrc:      SpO2:   98% 100%    There is no height or weight on file to calculate BMI.   Physical Exam   Constitutional: Appears well-developed and well-nourished.  Psych: Affect appropriate to situation.  Eyes: No scleral injection.  HENT: No OP obstruction.  Head: Normocephalic.  Respiratory: Effort normal, non-labored breathing.   Neurologic Examination   Mental Status: Awake and alert. Fully oriented x 5. Thought content appropriate. Speech fluent without evidence of aphasia.  Able to follow all commands without difficulty. Moderate dysarthria is noted.  Cranial Nerves: II: Temporal visual fields  intact with no extinction to DSS. PERRL. III,IV, VI: No ptosis. EOMI. No nystagmus. V: Temp sensation decreased on the left VII: Left facial droop VIII: Hearing intact to voice IX,X: No hypophonia or hoarseness XI: Left shoulder girdle and trapezius with mildly decreased tone relative to the left XII: Tongue deviates slightly to the left.  Motor: RUE: 5/5 LUE: 4/5 proximally and distally RLE: 5/5 LLE: 4/5 proximally and distally Sensory: Decreased temp and FT sensation to LUE and LLE. No extinction to DSS. Deep Tendon Reflexes: 2+ and symmetric bilateral biceps, brachioradialis and patellae Cerebellar: No ataxia with FNF bilaterally Gait: Able to stand with own power, with more difficulty supporting his weight on his left leg. Mildly antalgic gait.    Labs/Imaging/Neurodiagnostic studies   CBC:  Recent Labs  Lab 05/23/2024 0538 05/23/2024 0941  WBC 6.4 4.3  NEUTROABS 3.5  --   HGB 13.9 12.6*  HCT 40.3 36.5*  MCV 99.8 98.9  PLT 77* 65*   Basic Metabolic Panel:  Lab Results  Component Value Date   NA 136 05/22/2024   K 3.8 05/22/2024   CO2 22 05/22/2024   GLUCOSE 119 (H) 05/22/2024   BUN 7 (L) 05/22/2024   CREATININE 0.65 05/22/2024   CALCIUM 9.9 05/22/2024   GFRNONAA >60 05/22/2024   GFRAA >60 07/13/2019   Lipid Panel: No results found for: LDLCALC HgbA1c: No results found for: HGBA1C Urine Drug Screen:     Component Value Date/Time   LABOPIA NONE DETECTED 05/22/2024 0613   COCAINSCRNUR POSITIVE (A) 05/22/2024 0613   LABBENZ NONE DETECTED 05/22/2024 0613   AMPHETMU NONE DETECTED 05/22/2024 0613   THCU NONE DETECTED 05/22/2024 0613   LABBARB NONE DETECTED 05/22/2024 0613    Alcohol Level     Component Value Date/Time   ETH 128 (H) 05/22/2024 0538   INR  Lab Results  Component Value Date   INR 1.2 05/22/2024   APTT  Lab Results  Component Value Date   APTT 32 05/22/2024   TTE: 1. Left ventricular ejection fraction, by estimation, is 60 to 65%.  The  left ventricle has normal function. The left ventricle has no regional  wall motion abnormalities. Left ventricular diastolic parameters are  consistent with Grade I diastolic dysfunction (impaired relaxation).   2. Right ventricular systolic function is normal. The right ventricular  size is normal.   3. The mitral valve is normal in structure. No evidence of mitral valve  regurgitation. No evidence of mitral stenosis.   4. The aortic valve is normal in structure. Aortic valve regurgitation is  not visualized. Mild aortic valve stenosis.   5. The inferior vena cava is normal in size with greater than 50%  respiratory variability, suggesting right atrial pressure of 3 mmHg.   6. Agitated saline contrast bubble study was negative, with no evidence  of any interatrial shunt.   ASSESSMENT  63 y.o. homeless male with a PMHx of cocaine use, EtOH dependence, current smoker, chronic hepatitis C, HTN, alcoholic liver cirrhosis, and pulmonary embolism (formerly on Eliquis , which was discontinued by his physician approximately 4 months ago) who presented to the ED early this AM with new onset of left face, arm and leg numbness that began 4 days PTA. He also endorsed drooling from the left side of his mouth. He denied any aphasia. He is a daily heavy EtOH user with last drink about 5 hours prior to presentation. - Exam reveals dysarthria, left facial droop, left sided weakness and sensory numbness.  - CTA of head and neck: Negative for large vessel occlusion. Positive for bulky and severe calcified atherosclerosis at the skull base resulting in High-grade Stenoses (radiographic string signs) of the Bilateral Supraclinoid ICAs AND bilateral Vertebral Artery V4 segments. No significant arterial stenosis in the neck. No acute intracranial abnormality by CT. Chronic white matter disease and chronic left frontal lobe encephalomalacia. Aortic Atherosclerosis - MRI brain: Punctate acute to subacute lacunar  infarct in the caudal right thalamus. No associated hemorrhage or mass effect. Underlying advanced chronic small vessel disease, including advanced white matter disease and multiple chronic micro-hemorrhages in the bilateral deep gray nuclei. Superimposed probably posttraumatic encephalomalacia in the anterior left superior frontal gyrus, with scattered bilateral Superficial Siderosis. - TTE unremarkable from a stroke standpoint - EKG: Atrial fibrillation; Right bundle branch block; Anteroseptal infarct, age indeterminate - Labs: - AST and ALT elevated.  - BUN 7, Cr 0.65, eGFR > 60 - WBC normal - UDS positive for  cocaine. EtOH level 128 on presentation.  - PT elevated at 15.6. INR and PTT normal.  - Glucose 119 - HIV nonreactive - Impression:  - Acute right thalamic lacunar infarction - Bulky and severe calcified atherosclerosis at the skull base resulting in High-grade Stenoses (radiographic string signs) of the Bilateral Supraclinoid ICAs AND bilateral Vertebral Artery V4 segments.  - Cocaine abuse. This may have played a role, over time, in the etiology of the severe stenoses noted on CTA.  - Ethanol abuse - Chronic deep grey nuclei and superficial cortical hemorrhages. DDx for the former includes chronic hypertensive microhemorrhages versus amyloid angiopathy. DDx for the latter includes possible prior traumatic subarachnoid hemorrhage versus amyloid angiopathy.  - Evidence for possible prior left frontal lobe TBI on MRI  RECOMMENDATIONS  - CIWA protocol - Agree with thiamine  supplementation - Smoking and drug cessation counseling - EtOH cessation counseling - Agree with starting ASA. See below regarding anticoagulation.  - Could consider restarting anticoagulation due to atrial fibrillation, as seen on EKG. However, this would need to be weighed against the increased risk for ICH given the multiple chronic microhemorrhages seen in the bilateral deep grey nuclei, which are most likely  due to chronic HTN, and which may present an unacceptable risk for catastrophic hemorrhage if started on anticoagulation. Will need Cardiology input.  - Cardiology consult also indicated given the EKG findings - HgbA1c, fasting lipid panel - PT consult, OT consult, Speech consult - Statins should be avoided given his elevated LFTs and chronic liver disease - Risk factor modification - BP management per standard protocol. He is out of the permissive HTN time window.  - Telemetry monitoring - Frequent neuro checks - NPO until passes stroke swallow screen ______________________________________________________________________    Bonney SHARK, Baylor Cortez, MD [facility]
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
## 2024-05-22 NOTE — Progress Notes (Signed)
 SLP Cancellation Note  Patient Details Name: Jose [name] MRN: [id number] DOB: March 20, 1961   Cancelled treatment:       Reason Eval/Treat Not Completed: SLP screened, no needs identified, will sign off (chart reviewed; consulted NSG and met w/ pt in room.)   Pt is a 63 y.o. male with medical history significant for hepatitis C, liver cirrhosis, alcohol use, polysubstance use, pulmonary embolism not on anticoagulation, HTN not on medication who came into the ED at [facility] with left-sided weakness and sensory loss for the last 4 days. No reports of speech or swallowing issues per chart. MRI revealed: 1. Punctate acute to subacute lacunar infarct in the caudal right thalamus. No associated hemorrhage or mass effect. 2. Underlying advanced chronic small vessel disease, including advanced white matter disease and multiple chronic micro-hemorrhages in the bilateral deep gray nuclei.  Pt denied any difficulty swallowing and is currently on a regular diet; tolerates swallowing pills w/ water  per NSG. Pt conversed in conversation to answer questions and indicated wants/needs w/out overt expressive/receptive deficits noted; pt denied any speech-language deficits. Speech intelligible. Pt asked for po items which were provided. No further skilled ST services indicated as pt appears at his baseline. Pt agreed. NSG to reconsult if any change in status while admitted.       Comer Portugal, MS, CCC-SLP Speech Language Pathologist Rehab Services; [facility] [phone] (ascom) Terra Aveni 05/22/2024, 2:13 PM

## 2024-05-22 NOTE — Consult Note (Signed)
 [facility]IN[facility] CARDIOLOGY CONSULT NOTE       Patient ID: Jose [name] MRN: [id number] DOB/AGE: 1961-03-11 63 y.o.  Admit date: 05/22/2024 Referring Physician Dr. Roann Primary Physician Pcp, No Primary Cardiologist None Reason for Consultation acute stroke, new AF  HPI: Jose [name] is a 63 y.o. male  with a past medical history of hepatitis C, liver cirrhosis, alcohol use, polysubstance use, history of of PE not on anticoagulation, hypertension not on medication who presented to the ED on 05/22/2024 for left-sided weakness, sensory loss, numbness/weakness of face, leg.  Patient with no known history of stroke or AF.  Patient found to have acute ischemic stroke during this admission. Reported to have new onset AF. Cardiology was consulted for further evaluation.   Work up in the ED notable for sodium 136, potassium 3.8, mag 1.6, creatinine 0.71, hemoglobin 13.9, platelets 77.  LFTs elevated, AST 221, ALT 127.  CTA negative for large vessel occlusion, high-grade stenosis of the bilateral supraclinoid ICAs and bilateral vertebral artery V4 segments.  MRI brain with punctate acute to subacute lacunar infarct in the caudal right thalamas.  Patient given 1X aspirin  35 mg, IV magnesium  replacement.  At the time of my evaluation this afternoon, patient was resting comfortably in ED stretcher. Patient states he has no known hx of stroke, AF, CAD, MI or HF. Patient denies any chest pain/pressure, palpitations, lightheadedness/dizziness, SOB or syncope. Patient states he feels overall okay. Reviewed available EKGs and telemetry and there is no evidence of AF/AFL.    Review of systems complete and found to be negative unless listed above    Past Medical History:  Diagnosis Date   Cocaine use    EtOH dependence (HCC)    Hep C w/o coma, chronic (HCC)    Hypertension    Liver cirrhosis, alcoholic (HCC)    Polysubstance abuse (HCC)    Pulmonary embolism (HCC)     Past Surgical  History:  Procedure Laterality Date   ESOPHAGOGASTRODUODENOSCOPY (EGD) WITH PROPOFOL  N/A 03/02/2019   Procedure: ESOPHAGOGASTRODUODENOSCOPY (EGD) WITH PROPOFOL ;  Surgeon: Sebastian Moles, MD;  Location: [facility] ENDOSCOPY;  Service: General;  Laterality: N/A;   IR GASTROSTOMY TUBE REMOVAL  05/04/2019   PEG PLACEMENT N/A 03/02/2019   Procedure: PERCUTANEOUS ENDOSCOPIC GASTROSTOMY (PEG) PLACEMENT;  Surgeon: Sebastian Moles, MD;  Location: [facility] ENDOSCOPY;  Service: General;  Laterality: N/A;   PERCUTANEOUS TRACHEOSTOMY N/A 02/12/2019   Procedure: PERCUTANEOUS TRACHEOSTOMY;  Surgeon: Sebastian Moles, MD;  Location: [facility] OR;  Service: General;  Laterality: N/A;    (Not in a hospital admission)  Social History   Socioeconomic History   Marital status: Divorced    Spouse name: Not on file   Number of children: Not on file   Years of education: Not on file   Highest education level: Not on file  Occupational History   Not on file  Tobacco Use   Smoking status: Every Day    Current packs/day: 0.50    Average packs/day: 0.5 packs/day for 45.0 years (22.5 ttl pk-yrs)    Types: Cigarettes   Smokeless tobacco: Never  Vaping Use   Vaping status: Never Used  Substance and Sexual Activity   Alcohol use: Yes    Alcohol/week: 2.0 standard drinks of alcohol    Types: 2 Cans of beer per week   Drug use: Yes    Types: Marijuana, Cocaine   Sexual activity: Never  Other Topics Concern   Not on file  Social History Narrative   ** Merged  History Encounter **       Social Drivers of Corporate investment banker Strain: Not on file  Food Insecurity: Food Insecurity Present (07/09/2023)   Hunger Vital Sign    Worried About Running Out of Food in the Last Year: Often true    Ran Out of Food in the Last Year: Sometimes true  Transportation Needs: No Transportation Needs (07/09/2023)   PRAPARE - Administrator, Civil Service (Medical): No    Lack of Transportation (Non-Medical): No  Physical  Activity: Not on file  Stress: Not on file  Social Connections: Not on file  Intimate Partner Violence: Not At Risk (07/09/2023)   Humiliation, Afraid, Rape, and Kick questionnaire    Fear of Current or Ex-Partner: No    Emotionally Abused: No    Physically Abused: No    Sexually Abused: No    History reviewed. No pertinent family history.   Vitals:   05/22/24 0748 05/22/24 0800 05/22/24 0845 05/22/24 1009  BP: (!) 169/98   (!) 172/93  Pulse: 76 74 97 91  Resp: 15 16 15 18   Temp:    98.1 F (36.7 C)  TempSrc:      SpO2: 100% 100% 97% 98%    PHYSICAL EXAM General: Well appearing male, well nourished, in no acute distress. HEENT: Normocephalic and atraumatic. Neck: No JVD.   Lungs: Normal respiratory effort on room air. Clear bilaterally to auscultation. No wheezes, crackles, rhonchi.  Heart: HRRR. Normal S1 and S2 without gallops or murmurs.  Abdomen: Non-distended appearing.  Msk: Normal strength and tone for age. Extremities: Warm and well perfused. No clubbing, cyanosis, edema.  Neuro: Alert and oriented X 3. Psych: Answers questions appropriately.   Labs: Basic Metabolic Panel: Recent Labs    05/22/24 0538 05/22/24 0941  NA 136  --   K 3.8  --   CL 103  --   CO2 22  --   GLUCOSE 119*  --   BUN 7*  --   CREATININE 0.71 0.65  CALCIUM 9.9  --   MG 1.6*  --   PHOS 3.6  --    Liver Function Tests: Recent Labs    05/22/24 0538  AST 221*  ALT 127*  ALKPHOS 70  BILITOT 0.9  PROT 8.6*  ALBUMIN  3.8   No results for input(s): LIPASE, AMYLASE in the last 72 hours. CBC: Recent Labs    05/22/24 0538 05/22/24 0941  WBC 6.4 4.3  NEUTROABS 3.5  --   HGB 13.9 12.6*  HCT 40.3 36.5*  MCV 99.8 98.9  PLT 77* 65*   Cardiac Enzymes: No results for input(s): CKTOTAL, CKMB, CKMBINDEX, TROPONINIHS in the last 72 hours. BNP: No results for input(s): BNP in the last 72 hours. D-Dimer: No results for input(s): DDIMER in the last 72  hours. Hemoglobin A1C: No results for input(s): HGBA1C in the last 72 hours. Fasting Lipid Panel: No results for input(s): CHOL, HDL, LDLCALC, TRIG, CHOLHDL, LDLDIRECT in the last 72 hours. Thyroid Function Tests: No results for input(s): TSH, T4TOTAL, T3FREE, THYROIDAB in the last 72 hours.  Invalid input(s): FREET3 Anemia Panel: No results for input(s): VITAMINB12, FOLATE, FERRITIN, TIBC, IRON, RETICCTPCT in the last 72 hours.   Radiology: MR BRAIN WO CONTRAST Result Date: 05/22/2024 CLINICAL DATA:  63 year old male with left side weakness and numbness for 4 days. EXAM: MRI HEAD WITHOUT CONTRAST TECHNIQUE: Multiplanar, multiecho pulse sequences of the brain and surrounding structures were obtained without intravenous contrast. COMPARISON:  CT head and CTA head and neck this morning. FINDINGS: Brain: Punctate asymmetric increased trace diffusion signal in the caudal right thalamus series 5, image 21. This appears faintly restricted on ADC (series 6, image 21) and there is faint T2 and FLAIR hyperintensity. No hemorrhage or mass effect. No other restricted diffusion. No midline shift, mass effect, evidence of mass lesion, ventriculomegaly, extra-axial collection or acute intracranial hemorrhage. Cervicomedullary junction and pituitary are within normal limits. Anterior left frontal lobe chronic cortical encephalomalacia, mild hemosiderin there. And scattered small areas of superficial siderosis elsewhere in the bilateral cerebral hemispheres (series 10, images 40 and 47). Confluent bilateral cerebral white matter T2 and FLAIR hyperintensity, multifocal involvement of the bilateral corona radiata. Multifocal bilateral basal ganglia chronic T2 and FLAIR heterogeneity. And multiple chronic microhemorrhages scattered in the bilateral deep gray nuclei. Relatively little hemispheric intra-axial hemosiderin elsewhere. Brainstem and cerebellum relatively spared. Vascular:  Major intracranial vascular flow voids are preserved. Skull and upper cervical spine: Degenerative craniocervical junction effusion on the right (series 8, image 1). Otherwise negative. Visualized bone marrow signal is within normal limits. Sinuses/Orbits: Stable from earlier CT, negative. Other: Grossly negative visible internal auditory structures. IMPRESSION: 1. Punctate acute to subacute lacunar infarct in the caudal right thalamus. No associated hemorrhage or mass effect. 2. Underlying advanced chronic small vessel disease, including advanced white matter disease and multiple chronic micro-hemorrhages in the bilateral deep gray nuclei. Superimposed probably posttraumatic encephalomalacia in the anterior left superior frontal gyrus, with scattered bilateral Superficial Siderosis. Electronically Signed   By: VEAR Hurst M.D.   On: 05/22/2024 07:47   CT Angio Head Neck W WO CM Result Date: 05/22/2024 CLINICAL DATA:  64 year old male with left side weakness and numbness for 4 days. EXAM: CT ANGIOGRAPHY HEAD AND NECK WITH AND WITHOUT CONTRAST TECHNIQUE: Multidetector CT imaging of the head and neck was performed using the standard protocol during bolus administration of intravenous contrast. Multiplanar CT image reconstructions and MIPs were obtained to evaluate the vascular anatomy. Carotid stenosis measurements (when applicable) are obtained utilizing NASCET criteria, using the distal internal carotid diameter as the denominator. RADIATION DOSE REDUCTION: This exam was performed according to the departmental dose-optimization program which includes automated exposure control, adjustment of the mA and/or kV according to patient size and/or use of iterative reconstruction technique. CONTRAST:  75mL OMNIPAQUE  IOHEXOL  350 MG/ML SOLN COMPARISON:  Head CT 05/18/2023. FINDINGS: CT HEAD Brain: Calcified atherosclerosis at the skull base. Stable cerebral volume since last year. Chronic encephalomalacia anterior left  superior frontal gyrus series 4, image 23, stable. Patchy and confluent bilateral cerebral white matter hypodensity, and associated heterogeneity in the deep white matter capsules, appears stable. Deep gray matter nuclei, brainstem and cerebellum appear relatively spared. No midline shift, ventriculomegaly, mass effect, evidence of mass lesion, intracranial hemorrhage or evidence of cortically based acute infarction. Calvarium and skull base: Chronic nasal bone fractures. Calvarium appears stable and intact. Paranasal sinuses: Visualized paranasal sinuses and mastoids are stable and well aerated. Orbits: No gaze deviation. Visible scalp and face soft tissues appear stable and negative. CTA NECK Skeleton: Carious dentition. Cervical spine degeneration with multilevel degenerative facet ankylosis. No acute osseous abnormality identified. Upper chest: Paraseptal and centrilobular emphysema. Negative visible superior mediastinum. Other neck: Nonvascular neck soft tissue spaces are within normal limits. Aortic arch: Calcified aortic atherosclerosis.  3 vessel arch. Right carotid system: No significant brachiocephalic artery or proximal right CCA stenosis. Calcified plaque in the mid right CCA without stenosis. Calcified plaque bifurcation primarily affects the ECA (  series 9, image 105). No significant right ICA plaque or stenosis. Left carotid system: Mild left CCA plaque intermittently from the origin without stenosis. Calcified plaque at the left carotid bifurcation affecting the left ICA origin and bulb. No stenosis results. Vertebral arteries: Proximal right subclavian artery calcified plaque and mild tortuosity without stenosis. Normal right vertebral artery origin. Right vertebral artery is patent to the skull base with no plaque or stenosis. Proximal left subclavian artery mild calcified plaque without stenosis. Normal left vertebral artery origin. Codominant left vertebral artery is patent and normal to the skull  base. CTA HEAD Posterior circulation: Bilateral V4 segment calcified plaque is bulky and severe, with high-grade bilateral vertebral V4 segment stenoses, radiographic string sign (series 11, images 159 and 155. However, both distal vertebral arteries and vertebrobasilar junction remain patent. Right PICA origin is patent distal to the severe stenosis. Left AICA appears dominant. Patent basilar artery without stenosis. Patent SCA and PCA origins. Left posterior communicating artery is present, the right is diminutive or absent. Bilateral PCA branches are patent without stenosis. Anterior circulation: Both ICA siphons are patent. However, moderate cavernous and severe supraclinoid calcified plaque bilaterally. Severe supraclinoid ICA stenosis, radiographic string sign on series 11, image 114. Normal left posterior communicating artery origin. Carotid termini, MCA and ACA origins remain normal. Anterior communicating artery and bilateral ACA branches are within normal limits. Left MCA M1 segment and trifurcation are patent without stenosis right MCA M1 segment and bifurcation are patent without stenosis. Bilateral MCA branches are within normal limits. Venous sinuses: Early contrast timing, not well evaluated. Anatomic variants: None. Review of the MIP images confirms the above findings IMPRESSION: 1. Negative for large vessel occlusion. But Positive for bulky and severe calcified atherosclerosis at the skull base resulting in High-grade Stenoses (radiographic string signs) of the Bilateral Supraclinoid ICAs AND bilateral Vertebral Artery V4 segments. No significant arterial stenosis in the neck. 2. No acute intracranial abnormality by CT. Chronic white matter disease and chronic left frontal lobe encephalomalacia. 3.  Aortic Atherosclerosis (ICD10-I70.0). 4. Emphysema (ICD10-J43.9). Electronically Signed   By: VEAR Hurst M.D.   On: 05/22/2024 06:37    ECHO ordered  TELEMETRY reviewed by me 05/22/2024: sinus rhythm,  rate 80s (no evidence of AF/AFL)  EKG reviewed by me: Sinus rhythm, RBBB, rate 75 bpm    Data reviewed by me 05/22/2024: last 24h vitals tele labs imaging I/O ED provider note, admission H&P.  Principal Problem:   TIA (transient ischemic attack) Active Problems:   Hepatitis C   Alcohol withdrawal (HCC)   Stimulant use disorder (HCC) (cocaine)   Tobacco use disorder   Tracheostomy status (HCC)   Essential hypertension   History of pulmonary embolism    ASSESSMENT AND PLAN:  Jose [name] is a 62 y.o. male  with a past medical history of hepatitis C, liver cirrhosis, alcohol use, polysubstance use, history of of PE not on anticoagulation, hypertension not on medication who presented to the ED on 05/22/2024 for left-sided weakness, sensory loss, numbness/weakness of face, leg.  Patient with no known history of stroke or AF.  Patient found to have acute ischemic stroke during this admission. Reported to have new onset AF. Cardiology was consulted for further evaluation.   # Acute ischemic stroke Reviewed available EKGs and telemetry and there is no evidence of AF/AFL.  -Monitor and replenish electrolytes for a goal K >4, Mag >2  -Echo ordered. -Closely monitor tele. -Discussed placing cardiac monitor at discharge to ensure no evidence of  AF/AFL with patient. Per patient request he will come to [facility] on 09/02 at 3:15 PM to have it placed.    # Hypertension -Amlodipine  5 mg daily.  # Possible Alcohol Withdrawal # Liver cirrhosis -Management per primary team. -On CIWA protocol.   This patient's plan of care was discussed and created with Dr. Ammon and he is in agreement.  Signed: Dorene Comfort, PA-C  05/22/2024, 11:26 AM [facility]

## 2024-05-22 NOTE — Progress Notes (Signed)
 OT Cancellation Note  Patient Details Name: Mansour Balboa MRN: [id number] DOB: March 11, 1961   Cancelled Treatment:    Reason Eval/Treat Not Completed: OT screened, no needs identified, will sign off. Per staff report, pt is at functional baseline for self care and mobility. Pt declined OT evaluation. OT to complete order.  Izetta Claude, MS, OTR/L , CBIS ascom [phone]  05/22/24, 11:54 AM

## 2024-05-22 NOTE — Evaluation (Signed)
 Physical Therapy Evaluation Patient Details Name: Jose [name] MRN: [id number] DOB: May 26, 1961 Today's Date: 05/22/2024  History of Present Illness  Jose [name] is a pleasant 63 y.o. male with medical history significant for hepatitis C, liver cirrhosis, alcohol use, polysubstance use, pulmonary embolism not on anticoagulation, HTN not on medication who came into the ED at [facility] with left-sided weakness and sensory loss for the last 4 days  Clinical Impression  Pt is a pleasant 63 year old male who was admitted for TIA. Prior to hospitalization, pt reports independence with ambulation and ADLs. Pt performs bed mobility independently. Pt demonstrates transfers and ambulation with CGA for safety. No LOB with balance assessments. Pt demonstrates mild deficits with sensation for his LLE and decreased grip strength L>R, however these deficits do not impede his safety and functional mobility. Pt demonstrates all bed mobility/transfers/ambulation at baseline level. Pt does not require any further PT needs at this time. Pt will be dc in house and does not require follow up. RN aware. Will dc current orders.          If plan is discharge home, recommend the following:     Can travel by private vehicle        Equipment Recommendations None recommended by PT  Recommendations for Other Services       Functional Status Assessment Patient has not had a recent decline in their functional status     Precautions / Restrictions Precautions Precautions: Fall Recall of Precautions/Restrictions: Intact Restrictions Weight Bearing Restrictions Per Provider Order: No      Mobility  Bed Mobility Overal bed mobility: Independent             General bed mobility comments: Pt able to go from supine<>sit at EOB    Transfers Overall transfer level: Independent Equipment used: None               General transfer comment: No use of UEs for ST from ED stretcher.     Ambulation/Gait Ambulation/Gait assistance: Independent Gait Distance (Feet): 200 Feet Assistive device: None Gait Pattern/deviations: Decreased dorsiflexion - left, Step-through pattern       General Gait Details: Pt demonstrates dec dorsiflexion and increased knee flexion in swing for his LLE. Amb without AD. CGA for safety. Pt able to perform turn and stop and change in gait speed safely without LOB.  Stairs            Wheelchair Mobility     Tilt Bed    Modified Rankin (Stroke Patients Only)       Balance Overall balance assessment: Needs assistance Sitting-balance support: Feet supported, No upper extremity supported Sitting balance-Leahy Scale: Normal Sitting balance - Comments: Able to maintain setaed balance at EOB. Performed coordination testing for LEs without LOB.   Standing balance support: No upper extremity supported, During functional activity Standing balance-Leahy Scale: Good Standing balance comment: Pt able to maintain balance with CGA for safety. Single Leg Stance - Right Leg:  (Able to stand on RLE for at least 1 sec. Pt reports this is baseline. No assistance needed for balance correction.) Single Leg Stance - Left Leg:  (able to maintain SLS without buckling for at least 2 sec. No assistance needed for balance correction.)         High level balance activites: Sudden stops, Turns, Other (comment) (180 turn and stop) High Level Balance Comments: No LOB with high level balance activities. CGA for safety.  Pertinent Vitals/Pain Pain Assessment Pain Assessment: No/denies pain    Home Living Family/patient expects to be discharged to:: Unsure Living Arrangements: Other relatives Available Help at Discharge: Neighbor Type of Home: House Home Access: Stairs to enter Entrance Stairs-Rails: None (Uses door frame to get into house) Secretary/administrator of Steps: 1   Home Layout: One level Home Equipment: None      Prior  Function Prior Level of Function : Independent/Modified Independent             Mobility Comments: No use of AD for ambulation. Community ambulator stating i walk to get everywhere ADLs Comments: Independent     Extremity/Trunk Assessment   Upper Extremity Assessment Upper Extremity Assessment: Overall WFL for tasks assessed;LUE deficits/detail LUE Deficits / Details: Decreased grip strength for L compared to R LUE Coordination: WNL    Lower Extremity Assessment Lower Extremity Assessment: Overall WFL for tasks assessed;LLE deficits/detail LLE Deficits / Details: Pt reports decreased sensation around S1 dermatome LLE Sensation: decreased light touch LLE Coordination: WNL    Cervical / Trunk Assessment Cervical / Trunk Assessment: Normal  Communication   Communication Communication: Impaired Factors Affecting Communication: Reduced clarity of speech    Cognition Arousal: Alert Behavior During Therapy: WFL for tasks assessed/performed   PT - Cognitive impairments: No apparent impairments                       PT - Cognition Comments: Pt is pleasant and agreeable to PT session. Following commands: Intact       Cueing Cueing Techniques: Verbal cues     General Comments      Exercises     Assessment/Plan    PT Assessment Patient does not need any further PT services  PT Problem List         PT Treatment Interventions      PT Goals (Current goals can be found in the Care Plan section)  Acute Rehab PT Goals Patient Stated Goal: to go home PT Goal Formulation: With patient Time For Goal Achievement: 06/05/24 Potential to Achieve Goals: Good    Frequency       Co-evaluation               AM-PAC PT 6 Clicks Mobility  Outcome Measure Help needed turning from your back to your side while in a flat bed without using bedrails?: None Help needed moving from lying on your back to sitting on the side of a flat bed without using  bedrails?: None Help needed moving to and from a bed to a chair (including a wheelchair)?: None Help needed standing up from a chair using your arms (e.g., wheelchair or bedside chair)?: None Help needed to walk in hospital room?: None Help needed climbing 3-5 steps with a railing? : None 6 Click Score: 24    End of Session Equipment Utilized During Treatment: Gait belt Activity Tolerance: Patient tolerated treatment well Patient left: in bed;with call bell/phone within reach Nurse Communication: Mobility status PT Visit Diagnosis: Other symptoms and signs involving the nervous system (R29.898)    Time: 8884-8867 PT Time Calculation (min) (ACUTE ONLY): 17 min   Charges:                 Deaja Rizo, SPT   Evetta Renner 05/22/2024, 1:20 PM

## 2024-05-22 NOTE — ED Provider Notes (Signed)
 [facility] Provider Note    Event Date/Time   First MD Initiated Contact with Patient 05/22/24 0530     (approximate)   History   Weakness   HPI  Jose [name] is a 63 y.o. male   Past medical history of liver cirrhosis, alcohol use, polysubstance use, pulmonary embolism not on anticoagulation, presents to the emergency department with left-sided weakness and sensory loss for the past 4 days.  Acute onset face, arm, leg numbness and weakness 4 days ago.  He did not see a doctor.  He does not have a headache.  He does have blurry vision.  He sustained no trauma.  He feels that he is drooling on the left side of his mouth.  He has had no changes in his symptoms, ongoing.  He continues to drink alcohol and uses a significant amount, he states that he drinks natural ice 40 ounces and has approximately 4 of these daily.  His last drink was a couple hours ago.  He also complains of chronic unchanged abdominal discomfort.   External Medical Documents Reviewed: Prior hospital visits for alcohol use, chronic abdominal pain      Physical Exam   Triage Vital Signs: ED Triage Vitals  Encounter Vitals Group     BP 05/22/24 0525 119/76     Girls Systolic BP Percentile --      Girls Diastolic BP Percentile --      Boys Systolic BP Percentile --      Boys Diastolic BP Percentile --      Pulse Rate 05/22/24 0525 76     Resp 05/22/24 0525 16     Temp 05/22/24 0525 97.7 F (36.5 C)     Temp Source 05/22/24 0525 Oral     SpO2 05/22/24 0525 100 %     Weight --      Height --      Head Circumference --      Peak Flow --      Pain Score 05/22/24 0526 0     Pain Loc --      Pain Education --      Exclude from Growth Chart --     Most recent vital signs: Vitals:   05/22/24 0525 05/22/24 0607  BP: 119/76 (!) 162/88  Pulse: 76 62  Resp: 16   Temp: 97.7 F (36.5 C)   SpO2: 100%     General: Awake, no distress.  CV:  Good peripheral  perfusion.  Resp:  Normal effort.  Abd:  No distention.  Other:  Awake alert comfortable appearing with no signs of acute alcohol withdrawal and normal hemodynamics (mild hypertension) and no tremors or tongue fasciculations.  He does have subjective decrease sensation on the left side of his body including his face arms and leg.  He has decreased grip strength on the left upper extremity, and wavers to gravity when lifting his left lower extremity.  There is no obvious facial droop but his sleep speech is slightly slurred.   ED Results / Procedures / Treatments   Labs (all labs ordered are listed, but only abnormal results are displayed) Labs Reviewed  COMPREHENSIVE METABOLIC PANEL WITH GFR - Abnormal; Notable for the following components:      Result Value   Glucose, Bld 119 (*)    BUN 7 (*)    Total Protein 8.6 (*)    AST 221 (*)    ALT 127 (*)    All other components  within normal limits  ETHANOL - Abnormal; Notable for the following components:   Alcohol, Ethyl (B) 128 (*)    All other components within normal limits  PROTIME-INR - Abnormal; Notable for the following components:   Prothrombin Time 15.6 (*)    All other components within normal limits  CBG MONITORING, ED - Abnormal; Notable for the following components:   Glucose-Capillary 116 (*)    All other components within normal limits  APTT  URINALYSIS, ROUTINE W REFLEX MICROSCOPIC  URINE DRUG SCREEN, QUALITATIVE ([facility] ONLY)  CBC WITH DIFFERENTIAL/PLATELET     I ordered and reviewed the above labs they are notable for chronically elevated liver enzymes unchanged from prior, alcohol level is 128.  Capillary glucose is 116.  EKG  ED ECG REPORT I, Ginnie Shams, the attending physician, personally viewed and interpreted this ECG.   Date: 05/22/2024  EKG Time: 0534  Rate: 75  Rhythm: sinus (computer reads as atrial fibrillation but I do believe I see P waves prior to every QRS complex, and  regular rhythm)  Axis:  nl  Intervals:nl  ST&T Change: No STEMI    RADIOLOGY I independently reviewed and interpreted CT scan of the head see no obvious bleeding. I also reviewed radiologist's formal read.   PROCEDURES:  Critical Care performed: Yes, see critical care procedure note(s)  .Critical Care  Performed by: Shams Ginnie, MD Authorized by: Shams Ginnie, MD   Critical care provider statement:    Critical care time (minutes):  30   Critical care was time spent personally by me on the following activities:  Development of treatment plan with patient or surrogate, discussions with consultants, evaluation of patient's response to treatment, examination of patient, ordering and review of laboratory studies, ordering and review of radiographic studies, ordering and performing treatments and interventions, pulse oximetry, re-evaluation of patient's condition and review of old charts    MEDICATIONS ORDERED IN ED: Medications  LORazepam  (ATIVAN ) tablet 1-4 mg (has no administration in time range)    Or  LORazepam  (ATIVAN ) injection 1-4 mg (has no administration in time range)  thiamine  (VITAMIN B1) tablet 100 mg (has no administration in time range)    Or  thiamine  (VITAMIN B1) injection 100 mg (has no administration in time range)  folic acid  (FOLVITE ) tablet 1 mg (has no administration in time range)  multivitamin with minerals tablet 1 tablet (has no administration in time range)  acetaminophen  (TYLENOL ) tablet 650 mg (650 mg Oral Given 05/22/24 0604)  ibuprofen  (ADVIL ) tablet 600 mg (600 mg Oral Given 05/22/24 0604)  iohexol  (OMNIPAQUE ) 350 MG/ML injection 75 mL (75 mLs Intravenous Contrast Given 05/22/24 9385)    External physician / consultants:  I spoke with hospital medicine for admission and regarding care plan for this patient.   IMPRESSION / MDM / ASSESSMENT AND PLAN / ED COURSE  I reviewed the triage vital signs and the nursing notes.                                Patient's  presentation is most consistent with acute presentation with potential threat to life or bodily function.  Differential diagnosis includes, but is not limited to, stroke, intoxication, electrolyte derangements, vitamin deficiency   The patient is on the cardiac monitor to evaluate for evidence of arrhythmia and/or significant heart rate changes.  MDM:    Concerned that he had a stroke.  He is well outside the thrombolytic window  as onset of symptoms are approximately 4 days ago with no acute changes in the interim.  Atraumatic without headache doubt bleeding.  No signs of alcohol withdrawal at this time but at high risk so put on CIWA protocol.  Proceed with stroke imaging including vessel imaging CT angiogram head and neck and MRI of the brain.  He has had difficulty walking due to his extremity weakness, will need admission regardless for PT/OT/rehab and neurology consultation.       FINAL CLINICAL IMPRESSION(S) / ED DIAGNOSES   Final diagnoses:  Left-sided weakness  Left sided numbness     Rx / DC Orders   ED Discharge Orders     None        Note:  This document was prepared using Dragon voice recognition software and may include unintentional dictation errors.    Cyrena Mylar, MD 05/22/24 [phone]

## 2024-05-22 NOTE — Progress Notes (Signed)
 Orders only for Cardiac monitor for acute stroke.  GABRIELLA COMUNALE DECOSTE, PA

## 2024-05-22 NOTE — ED Notes (Signed)
Cbg 116 °

## 2024-05-23 ENCOUNTER — Other Ambulatory Visit: Payer: Self-pay

## 2024-05-23 ENCOUNTER — Encounter: Payer: Self-pay | Admitting: Emergency Medicine

## 2024-05-23 ENCOUNTER — Emergency Department
Admission: EM | Admit: 2024-05-23 | Discharge: 2024-05-24 | Disposition: A | Discharge status: Home / Self Care | Payer: MEDICAID | Attending: Emergency Medicine | Admitting: Emergency Medicine

## 2024-05-23 DIAGNOSIS — F17200 Nicotine dependence, unspecified, uncomplicated: Secondary | ICD-10-CM | POA: Insufficient documentation

## 2024-05-23 DIAGNOSIS — I6523 Occlusion and stenosis of bilateral carotid arteries: Secondary | ICD-10-CM | POA: Diagnosis not present

## 2024-05-23 DIAGNOSIS — R200 Anesthesia of skin: Secondary | ICD-10-CM | POA: Diagnosis not present

## 2024-05-23 DIAGNOSIS — F10920 Alcohol use, unspecified with intoxication, uncomplicated: Secondary | ICD-10-CM | POA: Insufficient documentation

## 2024-05-23 DIAGNOSIS — Y906 Blood alcohol level of 120-199 mg/100 ml: Secondary | ICD-10-CM | POA: Insufficient documentation

## 2024-05-23 DIAGNOSIS — I4891 Unspecified atrial fibrillation: Secondary | ICD-10-CM | POA: Diagnosis not present

## 2024-05-23 DIAGNOSIS — I10 Essential (primary) hypertension: Secondary | ICD-10-CM | POA: Diagnosis not present

## 2024-05-23 DIAGNOSIS — F1090 Alcohol use, unspecified, uncomplicated: Secondary | ICD-10-CM | POA: Diagnosis present

## 2024-05-23 DIAGNOSIS — R531 Weakness: Secondary | ICD-10-CM | POA: Diagnosis not present

## 2024-05-23 DIAGNOSIS — G459 Transient cerebral ischemic attack, unspecified: Secondary | ICD-10-CM | POA: Diagnosis not present

## 2024-05-23 LAB — MAGNESIUM: Magnesium: 1.6 mg/dL — ABNORMAL LOW (ref 1.7–2.4)

## 2024-05-23 LAB — PROTIME-INR
INR: 1.3 — ABNORMAL HIGH (ref 0.8–1.2)
Prothrombin Time: 16.5 s — ABNORMAL HIGH (ref 11.4–15.2)

## 2024-05-23 LAB — CBC WITH DIFFERENTIAL/PLATELET
Abs Immature Granulocytes: 0.02 K/uL (ref 0.00–0.07)
Basophils Absolute: 0 K/uL (ref 0.0–0.1)
Basophils Relative: 0 %
Eosinophils Absolute: 0.1 K/uL (ref 0.0–0.5)
Eosinophils Relative: 1 %
HCT: 36.2 % — ABNORMAL LOW (ref 39.0–52.0)
Hemoglobin: 12.4 g/dL — ABNORMAL LOW (ref 13.0–17.0)
Immature Granulocytes: 0 %
Lymphocytes Relative: 38 %
Lymphs Abs: 1.9 K/uL (ref 0.7–4.0)
MCH: 34.2 pg — ABNORMAL HIGH (ref 26.0–34.0)
MCHC: 34.3 g/dL (ref 30.0–36.0)
MCV: 99.7 fL (ref 80.0–100.0)
Monocytes Absolute: 0.6 K/uL (ref 0.1–1.0)
Monocytes Relative: 11 %
Neutro Abs: 2.4 K/uL (ref 1.7–7.7)
Neutrophils Relative %: 50 %
Platelets: 63 K/uL — ABNORMAL LOW (ref 150–400)
RBC: 3.63 MIL/uL — ABNORMAL LOW (ref 4.22–5.81)
RDW: 13.1 % (ref 11.5–15.5)
Smear Review: NORMAL
WBC: 4.9 K/uL (ref 4.0–10.5)
nRBC: 0 % (ref 0.0–0.2)

## 2024-05-23 LAB — COMPREHENSIVE METABOLIC PANEL WITH GFR
ALT: 91 U/L — ABNORMAL HIGH (ref 0–44)
ALT: 108 U/L — ABNORMAL HIGH (ref 0–44)
AST: 153 U/L — ABNORMAL HIGH (ref 15–41)
AST: 201 U/L — ABNORMAL HIGH (ref 15–41)
Albumin: 3 g/dL — ABNORMAL LOW (ref 3.5–5.0)
Albumin: 3.5 g/dL (ref 3.5–5.0)
Alkaline Phosphatase: 59 U/L (ref 38–126)
Alkaline Phosphatase: 60 U/L (ref 38–126)
Anion gap: 9 (ref 5–15)
Anion gap: 9 (ref 5–15)
BUN: 10 mg/dL (ref 8–23)
BUN: 10 mg/dL (ref 8–23)
CO2: 27 mmol/L (ref 22–32)
CO2: 23 mmol/L (ref 22–32)
Calcium: 9.1 mg/dL (ref 8.9–10.3)
Calcium: 9.3 mg/dL (ref 8.9–10.3)
Chloride: 102 mmol/L (ref 98–111)
Chloride: 99 mmol/L (ref 98–111)
Creatinine, Ser: 0.8 mg/dL (ref 0.61–1.24)
Creatinine, Ser: 0.71 mg/dL (ref 0.61–1.24)
GFR, Estimated: >60 mL/min (ref >60)
GFR, Estimated: >60 mL/min (ref >60)
Glucose, Bld: 110 mg/dL — ABNORMAL HIGH (ref 70–99)
Glucose, Bld: 103 mg/dL — ABNORMAL HIGH (ref 70–99)
Potassium: 4.2 mmol/L (ref 3.5–5.1)
Potassium: 3.5 mmol/L (ref 3.5–5.1)
Sodium: 138 mmol/L (ref 135–145)
Sodium: 132 mmol/L — ABNORMAL LOW (ref 135–145)
Total Bilirubin: 1.5 mg/dL — ABNORMAL HIGH (ref 0.0–1.2)
Total Bilirubin: 1.2 mg/dL (ref 0.0–1.2)
Total Protein: 7.1 g/dL (ref 6.5–8.1)
Total Protein: 8.1 g/dL (ref 6.5–8.1)

## 2024-05-23 LAB — CBC
HCT: 34.8 % — ABNORMAL LOW (ref 39.0–52.0)
Hemoglobin: 12 g/dL — ABNORMAL LOW (ref 13.0–17.0)
MCH: 34.7 pg — ABNORMAL HIGH (ref 26.0–34.0)
MCHC: 34.5 g/dL (ref 30.0–36.0)
MCV: 100.6 fL — ABNORMAL HIGH (ref 80.0–100.0)
Platelets: 62 K/uL — ABNORMAL LOW (ref 150–400)
RBC: 3.46 MIL/uL — ABNORMAL LOW (ref 4.22–5.81)
RDW: 13.2 % (ref 11.5–15.5)
WBC: 4.7 K/uL (ref 4.0–10.5)
nRBC: 0 % (ref 0.0–0.2)

## 2024-05-23 LAB — LIPID PANEL
Cholesterol: 103 mg/dL (ref 0–200)
HDL: 25 mg/dL — ABNORMAL LOW (ref >40)
LDL Cholesterol: 62 mg/dL (ref 0–99)
Total CHOL/HDL Ratio: 4.1 ratio
Triglycerides: 80 mg/dL (ref <150)
VLDL: 16 mg/dL (ref 0–40)

## 2024-05-23 LAB — ETHANOL: Alcohol, Ethyl (B): 172 mg/dL — ABNORMAL HIGH (ref <15)

## 2024-05-23 MED ORDER — ASPIRIN 81 MG PO TBEC
81.0000 mg | DELAYED_RELEASE_TABLET | Freq: Every day | ORAL | 0 refills | Status: AC
  Filled 2024-05-26: qty 30, 30d supply, fill #0

## 2024-05-23 MED ORDER — ATORVASTATIN CALCIUM 40 MG PO TABS
40.0000 mg | ORAL_TABLET | Freq: Every day | ORAL | 0 refills | Status: AC
  Filled 2024-05-26: qty 30, 30d supply, fill #0

## 2024-05-23 NOTE — ED Triage Notes (Addendum)
 Patient c/o left side numbness since last Monday.  Patient seen recently for same.  Patient endorses etoh use tonight.  Patient states he drank 1 beer.  Patient slurring speech in triage. Patient requesting to go to room 109 tonight.

## 2024-05-23 NOTE — Progress Notes (Signed)
 SUBJECTIVE:.  Patient lying in bed comfortable without any chest pain or shortness of breath.   Vitals:   05/23/24 0011 05/23/24 0052 05/23/24 0425 05/23/24 0801  BP: (!) 155/86 132/87 (!) 156/94 (!) 138/91  Pulse: 74 77 69 76  Resp:    20  Temp: 98 F (36.7 C)  98.6 F (37 C) (!) 97.5 F (36.4 C)  TempSrc: Oral   Oral  SpO2: 99%  100% 98%    Intake/Output Summary (Last 24 hours) at 05/23/2024 1208 Last data filed at 05/23/2024 0900 Gross per 24 hour  Intake 1200 ml  Output --  Net 1200 ml    LABS: Basic Metabolic Panel: Recent Labs    05/22/24 0538 05/22/24 0941 05/23/24 0459  NA 136  --  138  K 3.8  --  4.2  CL 103  --  102  CO2 22  --  27  GLUCOSE 119*  --  110*  BUN 7*  --  10  CREATININE 0.71 0.65 0.80  CALCIUM  9.9  --  9.1  MG 1.6*  --   --   PHOS 3.6  --   --    Liver Function Tests: Recent Labs    05/22/24 0538 05/23/24 0459  AST 221* 153*  ALT 127* 91*  ALKPHOS 70 59  BILITOT 0.9 1.5*  PROT 8.6* 7.1  ALBUMIN  3.8 3.0*   No results for input(s): LIPASE, AMYLASE in the last 72 hours. CBC: Recent Labs    05/22/24 0538 05/22/24 0941 05/23/24 0459  WBC 6.4 4.3 4.7  NEUTROABS 3.5  --   --   HGB 13.9 12.6* 12.0*  HCT 40.3 36.5* 34.8*  MCV 99.8 98.9 100.6*  PLT 77* 65* 62*   Cardiac Enzymes: No results for input(s): CKTOTAL, CKMB, CKMBINDEX, TROPONINI in the last 72 hours. BNP: Invalid input(s): POCBNP D-Dimer: No results for input(s): DDIMER in the last 72 hours. Hemoglobin A1C: No results for input(s): HGBA1C in the last 72 hours. Fasting Lipid Panel: Recent Labs    05/23/24 0459  CHOL 103  HDL 25*  LDLCALC 62  TRIG 80  CHOLHDL 4.1   Thyroid Function Tests: No results for input(s): TSH, T4TOTAL, T3FREE, THYROIDAB in the last 72 hours.  Invalid input(s): FREET3 Anemia Panel: No results for input(s): VITAMINB12, FOLATE, FERRITIN, TIBC, IRON, RETICCTPCT in the last 72 hours.   PHYSICAL  EXAM General: Well developed, well nourished, in no acute distress HEENT:  Normocephalic and atramatic Neck:  No JVD.  Lungs: Clear bilaterally to auscultation and percussion. Heart: HRRR . Normal S1 and S2 without gallops or murmurs.  Abdomen: Bowel sounds are positive, abdomen soft and non-tender  Msk:  Back normal, normal gait. Normal strength and tone for age. Extremities: No clubbing, cyanosis or edema.   Neuro: Alert and oriented X 3. Psych:  Good affect, responds appropriately  TELEMETRY: Normal sinus rhythm 75 bpm  ASSESSMENT AND PLAN:  Jose [name] is a 63 y.o. male  with a past medical history of hepatitis C, liver cirrhosis, alcohol use, polysubstance use, history of of PE not on anticoagulation, hypertension not on medication who presented to the ED on 05/22/2024 for left-sided weakness, sensory loss, numbness/weakness of face, leg.  Patient with no known history of stroke or AF.  Patient found to have acute ischemic stroke during this admission. Reported to have new onset AF. Cardiology was consulted for further evaluation.    # Acute ischemic stroke Reviewed available EKGs and telemetry and there is no  evidence of AF/AFL.  -Monitor and replenish electrolytes for a goal K >4, Mag >2 .  Potassium today 4.2 and magnesium  1.6 yesterday. -Echo revealed left ventricular ejection fraction 60 to 65% with grade 1 diastolic dysfunction. -Closely monitor tele.  Monitor today shows sinus rhythm. -Discussed placing cardiac monitor at discharge to ensure no evidence of AF/AFL with patient. Per patient request he will come to [facility] on 09/02 at 3:15 PM to have it placed.     # Hypertension -Amlodipine  5 mg daily.   # Possible Alcohol Withdrawal # Liver cirrhosis -Management per primary team. -On CIWA protocol.   Principal Problem:   TIA (transient ischemic attack) Active Problems:   Hepatitis C   Alcohol withdrawal (HCC)   Stimulant use disorder (HCC) (cocaine)    Tobacco use disorder   Tracheostomy status (HCC)   Essential hypertension   History of pulmonary embolism   Cerebrovascular accident (CVA) (HCC)    Denyse Bathe, MD, [facility] 05/23/2024 12:08 PM

## 2024-05-23 NOTE — Plan of Care (Signed)
  Problem: Education: Goal: Knowledge of disease or condition will improve Outcome: Progressing Goal: Knowledge of secondary prevention will improve (MUST DOCUMENT ALL) Outcome: Progressing Goal: Knowledge of patient specific risk factors will improve (DELETE if not current risk factor) Outcome: Progressing   Problem: Ischemic Stroke/TIA Tissue Perfusion: Goal: Complications of ischemic stroke/TIA will be minimized Outcome: Progressing   Problem: Coping: Goal: Will verbalize positive feelings about self Outcome: Progressing Goal: Will identify appropriate support needs Outcome: Progressing   Problem: Health Behavior/Discharge Planning: Goal: Ability to manage health-related needs will improve Outcome: Progressing Goal: Goals will be collaboratively established with patient/family Outcome: Progressing   Problem: Self-Care: Goal: Ability to participate in self-care as condition permits will improve Outcome: Progressing Goal: Verbalization of feelings and concerns over difficulty with self-care will improve Outcome: Progressing Goal: Ability to communicate needs accurately will improve Outcome: Progressing   Problem: Nutrition: Goal: Risk of aspiration will decrease Outcome: Progressing Goal: Dietary intake will improve Outcome: Progressing   Problem: Clinical Measurements: Goal: Ability to maintain clinical measurements within normal limits will improve Outcome: Progressing Goal: Will remain free from infection Outcome: Progressing Goal: Diagnostic test results will improve Outcome: Progressing Goal: Respiratory complications will improve Outcome: Progressing Goal: Cardiovascular complication will be avoided Outcome: Progressing

## 2024-05-23 NOTE — Progress Notes (Signed)
 Patient is alert and oriented X 4,. discharge instruction given. No any questions at this time. Peripheral I/v removed.
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
## 2024-05-24 NOTE — ED Notes (Signed)
 This RN at bedside, given pt a sprite. Pt is ambulatory with a steady gait to the waiting room. Denies any other needs at this time.

## 2024-05-24 NOTE — ED Provider Notes (Signed)
 Patient is awake fully alert.  Ambulating back-and-forth to the bathroom.  He tells me he feels ready to go home and be discharged.  He appears well without evidence of instability or intoxication at this time.  Return precautions and treatment recommendations and follow-up discussed with the patient who is agreeable with the plan.  Appropriate for discharge.  Instructed patient not to drive home or while using alcohol    Dicky Anes, MD 05/24/24 [phone]

## 2024-05-24 NOTE — ED Notes (Signed)
Pt escorted to bathroom,steady gait

## 2024-05-24 NOTE — ED Provider Notes (Signed)
 [facility] Provider Note    Event Date/Time   First MD Initiated Contact with Patient 05/23/24 2303     (approximate)   History   Numbness   HPI  Jose [name] is a 63 y.o. male   Past medical history of homelessness, cocaine use, alcohol use, smoker, hep C, hypertension, liver cirrhosis, PE no longer on [facility], who just got discharged yesterday afternoon after suffering a right thalamic lacunar infarction.  Was seen by neurology, cardiology, and PT/OT and was discharged.  He said he went to [place].  He continues to have left-sided mild deficits from his stroke that are unchanged.  He came back to the emergency department because he feels unwell and admits to have been drinking alcohol throughout the day yesterday after discharge.  He has no focal complaints and no acute changes to his neurologic symptoms, he denies any chest pain, shortness of breath, respiratory infectious symptoms, GI or GU complaints.  He states that it is nighttime he wants to rest here before leaving.  He suggested to be readmitted to the hospital but offers no clear acute changes or new complaints after his discharge yesterday.  External Medical Documents Reviewed: Hospital notes from recent admission for stroke      Physical Exam   Triage Vital Signs: ED Triage Vitals  Encounter Vitals Group     BP 05/23/24 2143 93/69     Girls Systolic BP Percentile --      Girls Diastolic BP Percentile --      Boys Systolic BP Percentile --      Boys Diastolic BP Percentile --      Pulse Rate 05/23/24 2143 96     Resp 05/23/24 2143 18     Temp 05/23/24 2143 98.2 F (36.8 C)     Temp src --      SpO2 05/23/24 2143 98 %     Weight 05/23/24 2144 149 lb 14.6 oz (68 kg)     Height --      Head Circumference --      Peak Flow --      Pain Score 05/23/24 2144 0     Pain Loc --      Pain Education --      Exclude from Growth Chart --     Most recent vital signs: Vitals:    05/23/24 2143 05/24/24 0255  BP: 93/69 116/79  Pulse: 96 80  Resp: 18 16  Temp: 98.2 F (36.8 C) 97.6 F (36.4 C)  SpO2: 98% 97%    General: Awake, no distress.  CV:  Good peripheral perfusion.  Resp:  Normal effort.  Abd:  No distention.  Other:  Awake alert comfortable appearing.  Slightly decreased grip strength the same as yesterday to the left side, weakness of the left lower extremity similar compared to my examination with him yesterday.  He is seen to be ambulatory in the hallways of the emergency department during her stay.  No obvious signs of traumatic injury to the head on my examination.   ED Results / Procedures / Treatments   Labs (all labs ordered are listed, but only abnormal results are displayed) Labs Reviewed  CBC WITH DIFFERENTIAL/PLATELET - Abnormal; Notable for the following components:      Result Value   RBC 3.63 (*)    Hemoglobin 12.4 (*)    HCT 36.2 (*)    MCH 34.2 (*)    Platelets 63 (*)    All  other components within normal limits  COMPREHENSIVE METABOLIC PANEL WITH GFR - Abnormal; Notable for the following components:   Sodium 132 (*)    Glucose, Bld 103 (*)    AST 201 (*)    ALT 108 (*)    All other components within normal limits  MAGNESIUM  - Abnormal; Notable for the following components:   Magnesium  1.6 (*)    All other components within normal limits  ETHANOL - Abnormal; Notable for the following components:   Alcohol, Ethyl (B) 172 (*)    All other components within normal limits     I ordered and reviewed the above labs they are notable for chronically elevated LFTs consistent with his liver cirrhosis, alcohol level today is 170  EKG  ED ECG REPORT I, Ginnie Shams, the attending physician, personally viewed and interpreted this ECG.   Date: 05/24/2024  EKG Time: 2153  Rate: 91  Rhythm: sinus  Axis: nl  Intervals:rbbb  ST&T Change: no stemi   PROCEDURES:  Critical Care performed: No  Procedures   MEDICATIONS ORDERED  IN ED: Medications - No data to display   IMPRESSION / MDM / ASSESSMENT AND PLAN / ED COURSE  I reviewed the triage vital signs and the nursing notes.                                Patient's presentation is most consistent with severe exacerbation of chronic illness.  Differential diagnosis includes, but is not limited to, stroke, alcohol intoxication, infection, social problems, malingering   The patient is on the cardiac monitor to evaluate for evidence of arrhythmia and/or significant heart rate changes.  MDM:    He arrives with a same exam and complaints as his discharge yesterday for his stroke with mild left-sided deficits.  He was evaluated by neurology, cardiology, PT and OT and deemed appropriate for discharge as of yesterday afternoon.  He drank alcohol yesterday and presents to the emergency department tonight requesting readmission but has no acute changes or new complaints.  Since it is middle night we will hold to the morning prior to discharge.        FINAL CLINICAL IMPRESSION(S) / ED DIAGNOSES   Final diagnoses:  Alcoholic intoxication without complication (HCC)     Rx / DC Orders   ED Discharge Orders     None        Note:  This document was prepared using Dragon voice recognition software and may include unintentional dictation errors.    Shams Ginnie, MD 05/24/24 [phone]

## 2024-05-26 ENCOUNTER — Other Ambulatory Visit: Payer: Self-pay

## 2024-05-26 LAB — HEMOGLOBIN A1C
Hgb A1c MFr Bld: 5 % (ref 4.8–5.6)
Mean Plasma Glucose: 97 mg/dL

## 2024-05-26 NOTE — Congregational Nurse Program (Signed)
  Dept: [phone]   Congregational Nurse Program Note  Date of Encounter: 05/26/2024 Client to [facility] day center, nurse led clinic with request to cancel his cardiology appointment scheduled for this afternoon. Appointment was made upon his hospital discharge for the placement of a heart monitor. Appointment rescheduled for Friday 9/5 at 11:00 am. Client was admitted to [facility] on 8/29 for evaluation of left side weakness and left sided facial numbness, he was diagnosed with a TIA. He continues with both at visit today. He was prescribed medications at discharge that were sent to [facility] on [address]. He has not picked them up as of this visit. RN contacted the [facility] pharmacy who will have the medications transferred and filled there. Client is aware. MARLA Marina BSN, RN Past Medical History: Past Medical History:  Diagnosis Date   Cocaine use    EtOH dependence (HCC)    Hep C w/o coma, chronic (HCC)    Hypertension    Liver cirrhosis, alcoholic (HCC)    Polysubstance abuse (HCC)    Pulmonary embolism [facility])     Encounter Details:  Community Questionnaire - 05/26/24 1130       Questionnaire   Ask client: Do you give verbal consent for me to treat you today? Yes    Student Assistance N/A    Location Patient Served  [facility]    Encounter Setting CN site    Population Status Unhoused    Insurance Medicaid   [place] Access   Insurance/Financial Assistance Referral N/A    Medication N/A    Medical Provider No   client was seen in July 2023, has not rescheduled follow up that was due in October   Screening Referrals Made N/A    Medical Referrals Made N/A    Medical Appointment Completed N/A    CNP Interventions Advocate/Support;Educate;Navigate Healthcare System    Screenings CN Performed Blood Pressure    ED Visit Averted N/A    Life-Saving Intervention Made N/A

## 2024-05-27 NOTE — Congregational Nurse Program (Signed)
  Dept: [phone]   Congregational Nurse Program Note  Date of Encounter: 05/27/2024 Client to [facility] day center, nurse led clinic, for blood pressure check and to pick up medications RN had from [employer] ordered from his discharge last week: atorvastatin  and ASA EC 81 mg. Medication education given. BP 128/62 (BP Location: Left Arm, Patient Position: Sitting, Cuff Size: Normal)   Pulse 86   SpO2 98% . Client does plan to attend his cardiology apt on 9/5 and stated that he had a ride. No other needs at this time. MARLA Marina BSN, RN  Past Medical History: Past Medical History:  Diagnosis Date   Cocaine use    EtOH dependence (HCC)    Hep C w/o coma, chronic (HCC)    Hypertension    Liver cirrhosis, alcoholic (HCC)    Polysubstance abuse (HCC)    Pulmonary embolism (HCC)     Encounter Details:  Community Questionnaire - 05/27/24 1000       Questionnaire   Ask client: Do you give verbal consent for me to treat you today? Yes    Student Assistance N/A    Location Patient Served  [facility]    Encounter Setting CN site    Population Status Unhoused    Insurance Medicaid   [place] Access   Insurance/Financial Assistance Referral N/A    Medication N/A    Medical Provider No   client was seen in July 2023, has not rescheduled follow up that was due in October   Screening Referrals Made N/A    Medical Referrals Made N/A    Medical Appointment Completed N/A    CNP Interventions Advocate/Support;Educate    Screenings CN Performed Blood Pressure    ED Visit Averted N/A    Life-Saving Intervention Made N/A

## 2024-06-05 ENCOUNTER — Other Ambulatory Visit: Payer: Self-pay

## 2024-06-05 DIAGNOSIS — F10120 Alcohol abuse with intoxication, uncomplicated: Secondary | ICD-10-CM | POA: Insufficient documentation

## 2024-06-05 DIAGNOSIS — I10 Essential (primary) hypertension: Secondary | ICD-10-CM | POA: Diagnosis not present

## 2024-06-05 DIAGNOSIS — R079 Chest pain, unspecified: Secondary | ICD-10-CM | POA: Diagnosis not present

## 2024-06-05 DIAGNOSIS — R1031 Right lower quadrant pain: Secondary | ICD-10-CM | POA: Insufficient documentation

## 2024-06-05 DIAGNOSIS — R200 Anesthesia of skin: Secondary | ICD-10-CM | POA: Insufficient documentation

## 2024-06-05 DIAGNOSIS — Z8673 Personal history of transient ischemic attack (TIA), and cerebral infarction without residual deficits: Secondary | ICD-10-CM | POA: Insufficient documentation

## 2024-06-05 LAB — COMPREHENSIVE METABOLIC PANEL WITH GFR
ALT: 136 U/L — ABNORMAL HIGH (ref 0–44)
AST: 300 U/L — ABNORMAL HIGH (ref 15–41)
Albumin: 3.4 g/dL — ABNORMAL LOW (ref 3.5–5.0)
Alkaline Phosphatase: 70 U/L (ref 38–126)
Anion gap: 10 (ref 5–15)
BUN: 8 mg/dL (ref 8–23)
CO2: 21 mmol/L — ABNORMAL LOW (ref 22–32)
Calcium: 8.7 mg/dL — ABNORMAL LOW (ref 8.9–10.3)
Chloride: 104 mmol/L (ref 98–111)
Creatinine, Ser: 0.69 mg/dL (ref 0.61–1.24)
GFR, Estimated: >60 mL/min (ref >60)
Glucose, Bld: 106 mg/dL — ABNORMAL HIGH (ref 70–99)
Potassium: 3.5 mmol/L (ref 3.5–5.1)
Sodium: 135 mmol/L (ref 135–145)
Total Bilirubin: 1.2 mg/dL (ref 0.0–1.2)
Total Protein: 8.2 g/dL — ABNORMAL HIGH (ref 6.5–8.1)

## 2024-06-05 LAB — CBC
HCT: 36.5 % — ABNORMAL LOW (ref 39.0–52.0)
Hemoglobin: 12.7 g/dL — ABNORMAL LOW (ref 13.0–17.0)
MCH: 34.1 pg — ABNORMAL HIGH (ref 26.0–34.0)
MCHC: 34.8 g/dL (ref 30.0–36.0)
MCV: 98.1 fL (ref 80.0–100.0)
Platelets: 76 K/uL — ABNORMAL LOW (ref 150–400)
RBC: 3.72 MIL/uL — ABNORMAL LOW (ref 4.22–5.81)
RDW: 12.9 % (ref 11.5–15.5)
WBC: 5.9 K/uL (ref 4.0–10.5)
nRBC: 0 % (ref 0.0–0.2)

## 2024-06-05 LAB — LIPASE, BLOOD: Lipase: 49 U/L (ref 11–51)

## 2024-06-05 NOTE — ED Triage Notes (Signed)
 Pt to ed from store via [facility] for left sided flank pain and some numbness. Staff is familiar with patient. Pt is obviously intoxicated. Pt is caox4, in no acute distress and ambulatory.   82 HR  97% RA  140/85 98.6 T  108 BGL   18G LAC 324 ASA given

## 2024-06-06 ENCOUNTER — Emergency Department
Admission: EM | Admit: 2024-06-06 | Discharge: 2024-06-06 | Disposition: A | Discharge status: Home / Self Care | Payer: MEDICAID

## 2024-06-06 ENCOUNTER — Emergency Department: Payer: MEDICAID

## 2024-06-06 DIAGNOSIS — F10920 Alcohol use, unspecified with intoxication, uncomplicated: Secondary | ICD-10-CM

## 2024-06-06 DIAGNOSIS — R079 Chest pain, unspecified: Secondary | ICD-10-CM

## 2024-06-06 DIAGNOSIS — R109 Unspecified abdominal pain: Secondary | ICD-10-CM

## 2024-06-06 LAB — URINALYSIS, ROUTINE W REFLEX MICROSCOPIC
Bilirubin Urine: NEGATIVE
Glucose, UA: NEGATIVE mg/dL
Hgb urine dipstick: NEGATIVE
Ketones, ur: NEGATIVE mg/dL
Leukocytes,Ua: NEGATIVE
Nitrite: NEGATIVE
Protein, ur: NEGATIVE mg/dL
Specific Gravity, Urine: 1.002 — ABNORMAL LOW (ref 1.005–1.030)
pH: 6 (ref 5.0–8.0)

## 2024-06-06 LAB — TROPONIN I (HIGH SENSITIVITY): Troponin I (High Sensitivity): 6 ng/L (ref <18)

## 2024-06-06 MED ORDER — IOHEXOL 300 MG/ML  SOLN
100.0000 mL | Freq: Once | INTRAMUSCULAR | Status: AC | PRN
  Administered 2024-06-06: 100 mL via INTRAVENOUS

## 2024-06-06 MED ORDER — MORPHINE SULFATE (PF) 4 MG/ML IV SOLN
4.0000 mg | Freq: Once | INTRAVENOUS | Status: AC
  Administered 2024-06-06: 4 mg via INTRAVENOUS
  Filled 2024-06-06: qty 1

## 2024-06-06 MED ORDER — ALUM & MAG HYDROXIDE-SIMETH 200-200-20 MG/5ML PO SUSP
30.0000 mL | Freq: Once | ORAL | Status: AC
  Administered 2024-06-06: 30 mL via ORAL
  Filled 2024-06-06: qty 30

## 2024-06-06 MED ORDER — FAMOTIDINE 20 MG PO TABS
20.0000 mg | ORAL_TABLET | Freq: Two times a day (BID) | ORAL | 0 refills | Status: AC
  Filled 2024-06-09: qty 28, 14d supply, fill #0

## 2024-06-06 MED ORDER — FAMOTIDINE 20 MG PO TABS
20.0000 mg | ORAL_TABLET | Freq: Once | ORAL | Status: AC
  Administered 2024-06-06: 20 mg via ORAL
  Filled 2024-06-06: qty 1

## 2024-06-06 MED ORDER — LIDOCAINE VISCOUS HCL 2 % MT SOLN
15.0000 mL | Freq: Once | OROMUCOSAL | Status: AC
  Administered 2024-06-06: 15 mL via ORAL
  Filled 2024-06-06: qty 15

## 2024-06-06 NOTE — Discharge Instructions (Signed)
 Your evaluation in the emergency department is overall reassuring.  I prescribed you an antacid medication to see irritation of the lining of your stomach due to alcohol use may have contributed to your presentation.  Please follow-up with your primary care provider for reevaluation, and return to the emergency department with any new or worsening symptoms.

## 2024-06-06 NOTE — ED Provider Notes (Signed)
 [facility] Provider Note    Event Date/Time   First MD Initiated Contact with Patient 06/06/24 0203     (approximate)   History   Abdominal Pain  Pt to ed from store via [facility] for left sided flank pain and some numbness. Staff is familiar with patient. Pt is obviously intoxicated. Pt is caox4, in no acute distress and ambulatory.   82 HR  97% RA  140/85 98.6 T  108 BGL   18G LAC 324 ASA given   HPI Jose [name] is a 63 y.o. male PMH polysubstance use, hypertension, homelessness, cirrhosis, PE no longer on anticoagulation, recent CVA presents for evaluation of abdominal/flank as well as chest pain - Patient tells me he has been having abdominal and flank pain for weeks, worse today, had a few episodes of nonbloody vomitus.  Last bowel movement yesterday.  No black or bloody stools, no diarrhea.  Wonders if may be related to his cirrhosis. - Separately describes a constant chest pain that he has had over the past few weeks.  No shortness of breath.  No cough or fever.  Not pleuritic. - Endorses drinking shortly prior to arrival to ED - Repeatedly requesting pain medications - Denies any new neurologic symptoms on my eval   Per chart review, last seen in our emergency department 05/23/2024.  Admitted the day prior with mild left-sided deficits, found to have right lacunar infarct.     Physical Exam   Triage Vital Signs: ED Triage Vitals  Encounter Vitals Group     BP 06/05/24 2258 134/81     Girls Systolic BP Percentile --      Girls Diastolic BP Percentile --      Boys Systolic BP Percentile --      Boys Diastolic BP Percentile --      Pulse Rate 06/05/24 2258 83     Resp 06/05/24 2258 18     Temp 06/05/24 2258 98 F (36.7 C)     Temp Source 06/06/24 0240 Oral     SpO2 06/05/24 2258 96 %     Weight --      Height 06/05/24 2247 5' 8 (1.727 m)     Head Circumference --      Peak Flow --      Pain Score 06/05/24 2247 10     Pain  Loc --      Pain Education --      Exclude from Growth Chart --     Most recent vital signs: Vitals:   06/06/24 0219 06/06/24 0240  BP: (!) 147/93   Pulse: 70   Resp: 17   Temp:  98 F (36.7 C)  SpO2: 100%      General: Awake, no distress. + Intoxicated, smell of alcohol on breath CV:  Good peripheral perfusion. RRR, RP 2+.  Chest wall nontender to palpation. Resp:  Normal effort. CTAB Abd:  No distention.  Mildly distended, somewhat tender in right lower quadrant.  Mild right CVA tenderness.    ED Results / Procedures / Treatments   Labs (all labs ordered are listed, but only abnormal results are displayed) Labs Reviewed  COMPREHENSIVE METABOLIC PANEL WITH GFR - Abnormal; Notable for the following components:      Result Value   CO2 21 (*)    Glucose, Bld 106 (*)    Calcium  8.7 (*)    Total Protein 8.2 (*)    Albumin  3.4 (*)    AST 300 (*)  ALT 136 (*)    All other components within normal limits  CBC - Abnormal; Notable for the following components:   RBC 3.72 (*)    Hemoglobin 12.7 (*)    HCT 36.5 (*)    MCH 34.1 (*)    Platelets 76 (*)    All other components within normal limits  URINALYSIS, ROUTINE W REFLEX MICROSCOPIC - Abnormal; Notable for the following components:   Color, Urine STRAW (*)    APPearance CLEAR (*)    Specific Gravity, Urine 1.002 (*)    All other components within normal limits  LIPASE, BLOOD  ETHANOL  TROPONIN I (HIGH SENSITIVITY)  TROPONIN I (HIGH SENSITIVITY)     EKG  See ED course below   RADIOLOGY Radiology interpreted by myself radiology reports reviewed.  No acute pathology identified.    PROCEDURES:  Critical Care performed: No  Procedures   MEDICATIONS ORDERED IN ED: Medications  morphine  (PF) 4 MG/ML injection 4 mg (4 mg Intravenous Given 06/06/24 0313)  alum & mag hydroxide-simeth (MAALOX/MYLANTA) 200-200-20 MG/5ML suspension 30 mL (30 mLs Oral Given 06/06/24 0340)    And  lidocaine  (XYLOCAINE ) 2 %  viscous mouth solution 15 mL (15 mLs Oral Given 06/06/24 0340)  famotidine  (PEPCID ) tablet 20 mg (20 mg Oral Given 06/06/24 0340)  iohexol  (OMNIPAQUE ) 300 MG/ML solution 100 mL (100 mLs Intravenous Contrast Given 06/06/24 0332)     IMPRESSION / MDM / ASSESSMENT AND PLAN / ED COURSE  I reviewed the triage vital signs and the nursing notes.                              DDX/MDM/AP: Differential diagnosis includes, but is not limited to, alcohol intoxication, consider possibility of narcotic seeking behavior, consider appendicitis, bowel obstruction, atypical diverticulitis, urolithiasis, UTI, dyspepsia, pancreatitis, biliary pathology.  Do not clinically suspect SBP at this time.  Considered but doubt ACS, pneumothorax, pneumonia.  Plan: - Labs - EKG - CT abdomen pelvis - GI cocktail - Pain control - N.p.o. - Chest x-ray - Reassess  Patient's presentation is most consistent with acute presentation with potential threat to life or bodily function.  The patient is on the cardiac monitor to evaluate for evidence of arrhythmia and/or significant heart rate changes.  ED course below.  Workup unremarkable, chest discomfort for the several days per patient, no indication for repeat troponin.  Pain resolved after initial treatment.  Suspect alcohol intoxication contributing to presentation, clinically sober on reevaluation, stable for discharge home.  Consider possible dyspepsia contributing, Rx famotidine .  Do not suspect cardiac etiology or other acute intra-abdominal pathology at this time, CT abdomen pelvis negative.  Plan for PMD follow-up.  ED return precautions in place.  Patient agrees with plan.  Clinical Course as of 06/06/24 0752  Sat Jun 06, 2024  0257 CBC reviewed, unremarkable, at baseline  CMP overall at baseline, mild worsening of hepatobiliary labs in alc hep pattern [MM]  0257 Urinalysis with no evidence of infection [MM]  0308 Ecg = sinus rhythm, rate 85, no gross ST  elevation or depression, no significant repolarization abnormality, right bundle branch block present, normal axis, QTc somewhat prolonged at 511.  No evidence of ischemia or arrhythmia on my interpretation. [MM]  0358 CTAP: IMPRESSION: Fatty liver.  No acute abnormality is noted.   [MM]  [phone] Patient reevaluated, symptoms have resolved, feels much better.  Reassured by unremarkable workup.  Has clinically sobered on my evaluation.  Tells me he is going to get a bus pass from the lobby to go home.  Stable for discharge home from my perspective.  No evidence of acute pathology today.  ED return precautions placed.  Patient agrees with plan. [MM]    Clinical Course User Index [MM] Clarine Ozell LABOR, MD     FINAL CLINICAL IMPRESSION(S) / ED DIAGNOSES   Final diagnoses:  Alcoholic intoxication without complication (HCC)  Chest pain, unspecified type  Abdominal pain, unspecified abdominal location     Rx / DC Orders   ED Discharge Orders          Ordered    famotidine  (PEPCID ) 20 MG tablet  2 times daily        06/06/24 0750             Note:  This document was prepared using Dragon voice recognition software and may include unintentional dictation errors.   Clarine Ozell LABOR, MD 06/06/24 [phone]

## 2024-06-09 ENCOUNTER — Other Ambulatory Visit: Payer: Self-pay

## 2024-06-14 ENCOUNTER — Emergency Department
Admission: EM | Admit: 2024-06-14 | Discharge: 2024-06-15 | Disposition: A | Discharge status: Home / Self Care | Payer: MEDICAID | Attending: Emergency Medicine | Admitting: Emergency Medicine

## 2024-06-14 DIAGNOSIS — Z79899 Other long term (current) drug therapy: Secondary | ICD-10-CM | POA: Insufficient documentation

## 2024-06-14 DIAGNOSIS — Z7982 Long term (current) use of aspirin: Secondary | ICD-10-CM | POA: Insufficient documentation

## 2024-06-14 DIAGNOSIS — Z5900 Homelessness unspecified: Secondary | ICD-10-CM | POA: Diagnosis not present

## 2024-06-14 DIAGNOSIS — I10 Essential (primary) hypertension: Secondary | ICD-10-CM | POA: Diagnosis not present

## 2024-06-14 DIAGNOSIS — F10920 Alcohol use, unspecified with intoxication, uncomplicated: Secondary | ICD-10-CM | POA: Diagnosis present

## 2024-06-14 DIAGNOSIS — G8929 Other chronic pain: Secondary | ICD-10-CM

## 2024-06-14 NOTE — ED Triage Notes (Signed)
 Pt to ED via [facility] from the [facility]. Bystander called 911 for erratic behavior. Pt stated his last acholic drink was about 2300 today.  PT denies SI/HI.   Pt has a history of liver sorosis.   Vitals for EMS were: 02: 100% BP: 162/95 HR:70

## 2024-06-15 ENCOUNTER — Other Ambulatory Visit: Payer: Self-pay

## 2024-06-15 LAB — COMPREHENSIVE METABOLIC PANEL WITH GFR
ALT: 173 U/L — ABNORMAL HIGH (ref 0–44)
AST: 337 U/L — ABNORMAL HIGH (ref 15–41)
Albumin: 3.6 g/dL (ref 3.5–5.0)
Alkaline Phosphatase: 67 U/L (ref 38–126)
Anion gap: 13 (ref 5–15)
BUN: 8 mg/dL (ref 8–23)
CO2: 21 mmol/L — ABNORMAL LOW (ref 22–32)
Calcium: 9.1 mg/dL (ref 8.9–10.3)
Chloride: 100 mmol/L (ref 98–111)
Creatinine, Ser: 0.68 mg/dL (ref 0.61–1.24)
GFR, Estimated: >60 mL/min (ref >60)
Glucose, Bld: 107 mg/dL — ABNORMAL HIGH (ref 70–99)
Potassium: 3.4 mmol/L — ABNORMAL LOW (ref 3.5–5.1)
Sodium: 131 mmol/L — ABNORMAL LOW (ref 135–145)
Total Bilirubin: 1.7 mg/dL — ABNORMAL HIGH (ref 0.0–1.2)
Total Protein: 8.8 g/dL — ABNORMAL HIGH (ref 6.5–8.1)

## 2024-06-15 LAB — CBC WITH DIFFERENTIAL/PLATELET
Abs Immature Granulocytes: 0.02 K/uL (ref 0.00–0.07)
Basophils Absolute: 0 K/uL (ref 0.0–0.1)
Basophils Relative: 0 %
Eosinophils Absolute: 0 K/uL (ref 0.0–0.5)
Eosinophils Relative: 1 %
HCT: 40.7 % (ref 39.0–52.0)
Hemoglobin: 14 g/dL (ref 13.0–17.0)
Immature Granulocytes: 0 %
Lymphocytes Relative: 37 %
Lymphs Abs: 2.7 K/uL (ref 0.7–4.0)
MCH: 33.9 pg (ref 26.0–34.0)
MCHC: 34.4 g/dL (ref 30.0–36.0)
MCV: 98.5 fL (ref 80.0–100.0)
Monocytes Absolute: 0.7 K/uL (ref 0.1–1.0)
Monocytes Relative: 9 %
Neutro Abs: 3.8 K/uL (ref 1.7–7.7)
Neutrophils Relative %: 53 %
Platelets: 71 K/uL — ABNORMAL LOW (ref 150–400)
RBC: 4.13 MIL/uL — ABNORMAL LOW (ref 4.22–5.81)
RDW: 12.8 % (ref 11.5–15.5)
WBC: 7.2 K/uL (ref 4.0–10.5)
nRBC: 0 % (ref 0.0–0.2)

## 2024-06-15 LAB — URINALYSIS, ROUTINE W REFLEX MICROSCOPIC
Bilirubin Urine: NEGATIVE
Glucose, UA: NEGATIVE mg/dL
Hgb urine dipstick: NEGATIVE
Ketones, ur: NEGATIVE mg/dL
Leukocytes,Ua: NEGATIVE
Nitrite: NEGATIVE
Protein, ur: NEGATIVE mg/dL
Specific Gravity, Urine: 1.002 — ABNORMAL LOW (ref 1.005–1.030)
pH: 6 (ref 5.0–8.0)

## 2024-06-15 LAB — MAGNESIUM: Magnesium: 1.5 mg/dL — ABNORMAL LOW (ref 1.7–2.4)

## 2024-06-15 LAB — LIPASE, BLOOD: Lipase: 50 U/L (ref 11–51)

## 2024-06-15 MED ORDER — ONDANSETRON HCL 4 MG/2ML IJ SOLN
4.0000 mg | Freq: Once | INTRAMUSCULAR | Status: AC
  Administered 2024-06-15: 4 mg via INTRAVENOUS
  Filled 2024-06-15: qty 2

## 2024-06-15 MED ORDER — MAGNESIUM SULFATE 2 GM/50ML IV SOLN
2.0000 g | Freq: Once | INTRAVENOUS | Status: AC
  Administered 2024-06-15: 2 g via INTRAVENOUS
  Filled 2024-06-15: qty 50

## 2024-06-15 MED ORDER — POTASSIUM CHLORIDE CRYS ER 20 MEQ PO TBCR
40.0000 meq | EXTENDED_RELEASE_TABLET | Freq: Once | ORAL | Status: AC
  Administered 2024-06-15: 40 meq via ORAL
  Filled 2024-06-15: qty 2

## 2024-06-15 MED ORDER — THIAMINE HCL 100 MG/ML IJ SOLN
100.0000 mg | Freq: Once | INTRAMUSCULAR | Status: AC
  Administered 2024-06-15: 100 mg via INTRAVENOUS
  Filled 2024-06-15: qty 2

## 2024-06-15 MED ORDER — KETOROLAC TROMETHAMINE 30 MG/ML IJ SOLN
30.0000 mg | Freq: Once | INTRAMUSCULAR | Status: AC
  Administered 2024-06-15: 30 mg via INTRAVENOUS
  Filled 2024-06-15: qty 1

## 2024-06-15 MED ORDER — SODIUM CHLORIDE 0.9 % IV BOLUS (SEPSIS)
1000.0000 mL | Freq: Once | INTRAVENOUS | Status: AC
  Administered 2024-06-15: 1000 mL via INTRAVENOUS

## 2024-06-15 NOTE — ED Notes (Signed)
 Pt given discharge papers and escorted out the door by this RN.

## 2024-06-15 NOTE — ED Notes (Signed)
Urine sent to lab at this time.

## 2024-06-15 NOTE — ED Notes (Signed)
 ED Provider at bedside.

## 2024-06-15 NOTE — ED Notes (Signed)
 Pt up to restroom.

## 2024-06-15 NOTE — ED Notes (Signed)
Pt given sandwich tray and beverage. 

## 2024-06-15 NOTE — ED Provider Notes (Signed)
 [facility] Provider Note    Event Date/Time   First MD Initiated Contact with Patient 06/14/24 2353     (approximate)   History   Alcohol Intoxication   HPI  Jose [name] is a 63 y.o. male with history of homelessness, polysubstance use disorder, alcoholic cirrhosis, hepatitis C, hypertension who is well-known to our emergency department who presents with EMS for alcohol intoxication.  Patient complaining of chronic abdominal pain and chronic suicidal thoughts without plan.  No vomiting, diarrhea.  No chest pain or shortness of breath currently.   History provided by patient, EMS.    Past Medical History:  Diagnosis Date   Cocaine use    EtOH dependence (HCC)    Hep C w/o coma, chronic (HCC)    Hypertension    Liver cirrhosis, alcoholic (HCC)    Polysubstance abuse (HCC)    Pulmonary embolism (HCC)     Past Surgical History:  Procedure Laterality Date   ESOPHAGOGASTRODUODENOSCOPY (EGD) WITH PROPOFOL  N/A 03/02/2019   Procedure: ESOPHAGOGASTRODUODENOSCOPY (EGD) WITH PROPOFOL ;  Surgeon: Sebastian Moles, MD;  Location: [facility] ENDOSCOPY;  Service: General;  Laterality: N/A;   IR GASTROSTOMY TUBE REMOVAL  05/04/2019   PEG PLACEMENT N/A 03/02/2019   Procedure: PERCUTANEOUS ENDOSCOPIC GASTROSTOMY (PEG) PLACEMENT;  Surgeon: Sebastian Moles, MD;  Location: [facility] ENDOSCOPY;  Service: General;  Laterality: N/A;   PERCUTANEOUS TRACHEOSTOMY N/A 02/12/2019   Procedure: PERCUTANEOUS TRACHEOSTOMY;  Surgeon: Sebastian Moles, MD;  Location: [facility] OR;  Service: General;  Laterality: N/A;    MEDICATIONS:  Prior to Admission medications   Medication Sig Start Date End Date Taking? Authorizing Provider  albuterol  (VENTOLIN  HFA) 108 (90 Base) MCG/ACT inhaler Inhale 2 puffs into the lungs every 6 (six) hours as needed for wheezing or shortness of breath. 10/14/23   Janit Kast, PA-C  aspirin  EC 81 MG tablet Take 1 tablet (81 mg total) by mouth daily. Swallow whole.  05/23/24 06/25/24  Maree Hue, MD  atorvastatin  (LIPITOR) 40 MG tablet Take 1 tablet (40 mg total) by mouth daily. 05/23/24 06/25/24  Maree Hue, MD  famotidine  (PEPCID ) 20 MG tablet Take 1 tablet (20 mg total) by mouth 2 (two) times daily for 14 days. 06/06/24 06/23/24  Clarine Ozell LABOR, MD    Physical Exam   Triage Vital Signs: ED Triage Vitals  Encounter Vitals Group     BP 06/14/24 2355 (!) 148/105     Girls Systolic BP Percentile --      Girls Diastolic BP Percentile --      Boys Systolic BP Percentile --      Boys Diastolic BP Percentile --      Pulse Rate 06/14/24 2355 77     Resp 06/14/24 2355 18     Temp 06/14/24 2355 97.7 F (36.5 C)     Temp Source 06/14/24 2355 Oral     SpO2 06/14/24 2355 100 %     Weight 06/15/24 0005 140 lb (63.5 kg)     Height 06/15/24 0005 5' 8 (1.727 m)     Head Circumference --      Peak Flow --      Pain Score 06/15/24 0002 10     Pain Loc --      Pain Education --      Exclude from Growth Chart --     Most recent vital signs: Vitals:   06/14/24 2355  BP: (!) 148/105  Pulse: 77  Resp: 18  Temp: 97.7 F (36.5 C)  SpO2: 100%    CONSTITUTIONAL: Alert, responds appropriately to questions.  Chronically ill-appearing, intoxicated HEAD: Normocephalic, atraumatic EYES: Conjunctivae clear, pupils appear equal, sclera nonicteric ENT: normal nose; moist mucous membranes NECK: Supple, normal ROM CARD: RRR; S1 and S2 appreciated RESP: Normal chest excursion without splinting or tachypnea; breath sounds clear and equal bilaterally; no wheezes, no rhonchi, no rales, no hypoxia or respiratory distress, speaking full sentences ABD/GI: Non-distended; soft, non-tender, no rebound, no guarding, no peritoneal signs BACK: The back appears normal EXT: Normal ROM in all joints; no deformity noted, no edema SKIN: Normal color for age and race; warm; no rash on exposed skin NEURO: Moves all extremities equally, slightly slurred speech from  intoxication PSYCH: The patient's mood and manner are appropriate.   ED Results / Procedures / Treatments   LABS: (all labs ordered are listed, but only abnormal results are displayed) Labs Reviewed  CBC WITH DIFFERENTIAL/PLATELET - Abnormal; Notable for the following components:      Result Value   RBC 4.13 (*)    Platelets 71 (*)    All other components within normal limits  COMPREHENSIVE METABOLIC PANEL WITH GFR - Abnormal; Notable for the following components:   Sodium 131 (*)    Potassium 3.4 (*)    CO2 21 (*)    Glucose, Bld 107 (*)    Total Protein 8.8 (*)    AST 337 (*)    ALT 173 (*)    Total Bilirubin 1.7 (*)    All other components within normal limits  MAGNESIUM  - Abnormal; Notable for the following components:   Magnesium  1.5 (*)    All other components within normal limits  URINALYSIS, ROUTINE W REFLEX MICROSCOPIC - Abnormal; Notable for the following components:   Color, Urine YELLOW (*)    APPearance CLEAR (*)    Specific Gravity, Urine 1.002 (*)    All other components within normal limits  LIPASE, BLOOD     EKG:    EKG Interpretation Date/Time:  Monday June 15 2024 01:43:44 EDT Ventricular Rate:  72 PR Interval:  180 QRS Duration:  154 QT Interval:  472 QTC Calculation: 516 R Axis:   67  Text Interpretation: Normal sinus rhythm Right bundle branch block Abnormal ECG When compared with ECG of 05-Jun-2024 23:00, No significant change was found Confirmed by Neomi Neptune [phone]) on 06/15/2024 1:48:01 AM         RADIOLOGY: My personal review and interpretation of imaging:    I have personally reviewed all radiology reports.   No results found.   PROCEDURES:  Critical Care performed: No     Procedures    IMPRESSION / MDM / ASSESSMENT AND PLAN / ED COURSE  I reviewed the triage vital signs and the nursing notes.    Patient here for alcohol intoxication.  Complains of suicidal thoughts and chronic abdominal pain.  Abdominal  exam benign.    DIFFERENTIAL DIAGNOSIS (includes but not limited to):   Alcohol intoxication, no signs of withdrawal, malingering, substance use disorder,  chronic pain from cirrhosis, doubt decompensated liver failure, SBP, colitis, bowel obstruction, appendicitis, kidney stone, pyelonephritis, pancreatitis   Patient's presentation is most consistent with acute presentation with potential threat to life or bodily function.   PLAN: Labs, urine pending.  Will give IV fluids, Zofran , Toradol , thiamine .  Will monitor until clinically sober.  No indication for emergent abdominal imaging.  No indication for emergent psychiatric evaluation.  Patient often presents with suicidal thoughts while intoxicated and then once  clinically sober he denies any further thoughts, contracts for safety.   MEDICATIONS GIVEN IN ED: Medications  magnesium  sulfate IVPB 2 g 50 mL (2 g Intravenous New Bag/Given 06/15/24 0144)  sodium chloride  0.9 % bolus 1,000 mL (0 mLs Intravenous Stopped 06/15/24 0102)  ondansetron  (ZOFRAN ) injection 4 mg (4 mg Intravenous Given 06/15/24 0032)  thiamine  (VITAMIN B1) injection 100 mg (100 mg Intravenous Given 06/15/24 0032)  ketorolac  (TORADOL ) 30 MG/ML injection 30 mg (30 mg Intravenous Given 06/15/24 0031)  potassium chloride  SA (KLOR-CON  M) CR tablet 40 mEq (40 mEq Oral Given 06/15/24 0143)     ED COURSE: Patient's AST is elevated greater than ALT consistent with alcohol use disorder and similar to his prior.  Normal lipase.  Magnesium  low at 1.5.  Likely from alcohol use disorder.  Will give replacement.  Potassium of 3.4.  Urine shows no sign of infection.   2:32 AM  Pt repeatedly asking to leave because we have not given him morphine  for pain.  There is no indication for IV narcotics at this time.  No sign of any life-threatening process.  He has received IV magnesium  and oral potassium.  He has been able to eat a sandwich tray.  No acute psychiatric safety concerns.  Patient  ambulatory with steady gait.  Will discharge.   At this time, I do not feel there is any life-threatening condition present. I reviewed all nursing notes, vitals, pertinent previous records.  All lab and urine results, EKGs, imaging ordered have been independently reviewed and interpreted by myself.  I reviewed all available radiology reports from any imaging ordered this visit.  Based on my assessment, I feel the patient is safe to be discharged home without further emergent workup and can continue workup as an outpatient as needed. Discussed all findings, treatment plan as well as usual and customary return precautions.  They verbalize understanding and are comfortable with this plan.  Outpatient follow-up has been provided as needed.  All questions have been answered.   CONSULTS:  none   OUTSIDE RECORDS REVIEWED: Reviewed prior psychiatric notes.       FINAL CLINICAL IMPRESSION(S) / ED DIAGNOSES   Final diagnoses:  Acute alcoholic intoxication without complication (HCC)  Hypomagnesemia  Chronic abdominal pain     Rx / DC Orders   ED Discharge Orders     None        Note:  This document was prepared using Dragon voice recognition software and may include unintentional dictation errors.   Kym Scannell, Josette SAILOR, DO 06/15/24 [phone]

## 2024-07-01 ENCOUNTER — Emergency Department: Payer: MEDICAID

## 2024-07-01 ENCOUNTER — Other Ambulatory Visit: Payer: Self-pay

## 2024-07-01 DIAGNOSIS — Y906 Blood alcohol level of 120-199 mg/100 ml: Secondary | ICD-10-CM | POA: Diagnosis not present

## 2024-07-01 DIAGNOSIS — F10920 Alcohol use, unspecified with intoxication, uncomplicated: Secondary | ICD-10-CM | POA: Diagnosis not present

## 2024-07-01 DIAGNOSIS — R202 Paresthesia of skin: Secondary | ICD-10-CM | POA: Insufficient documentation

## 2024-07-01 DIAGNOSIS — R109 Unspecified abdominal pain: Secondary | ICD-10-CM | POA: Insufficient documentation

## 2024-07-01 LAB — URINALYSIS, ROUTINE W REFLEX MICROSCOPIC
Bilirubin Urine: NEGATIVE
Glucose, UA: NEGATIVE mg/dL
Hgb urine dipstick: NEGATIVE
Ketones, ur: NEGATIVE mg/dL
Leukocytes,Ua: NEGATIVE
Nitrite: NEGATIVE
Protein, ur: NEGATIVE mg/dL
Specific Gravity, Urine: 1.002 — ABNORMAL LOW (ref 1.005–1.030)
pH: 6 (ref 5.0–8.0)

## 2024-07-01 LAB — BASIC METABOLIC PANEL WITH GFR
Anion gap: 11 (ref 5–15)
BUN: 9 mg/dL (ref 8–23)
CO2: 23 mmol/L (ref 22–32)
Calcium: 9.7 mg/dL (ref 8.9–10.3)
Chloride: 104 mmol/L (ref 98–111)
Creatinine, Ser: 0.81 mg/dL (ref 0.61–1.24)
GFR, Estimated: >60 mL/min (ref >60)
Glucose, Bld: 110 mg/dL — ABNORMAL HIGH (ref 70–99)
Potassium: 4.6 mmol/L (ref 3.5–5.1)
Sodium: 138 mmol/L (ref 135–145)

## 2024-07-01 LAB — CBC WITH DIFFERENTIAL/PLATELET
Abs Immature Granulocytes: 0.02 K/uL (ref 0.00–0.07)
Basophils Absolute: 0 K/uL (ref 0.0–0.1)
Basophils Relative: 1 %
Eosinophils Absolute: 0 K/uL (ref 0.0–0.5)
Eosinophils Relative: 0 %
HCT: 37.3 % — ABNORMAL LOW (ref 39.0–52.0)
Hemoglobin: 12.6 g/dL — ABNORMAL LOW (ref 13.0–17.0)
Immature Granulocytes: 0 %
Lymphocytes Relative: 35 %
Lymphs Abs: 1.9 K/uL (ref 0.7–4.0)
MCH: 33.5 pg (ref 26.0–34.0)
MCHC: 33.8 g/dL (ref 30.0–36.0)
MCV: 99.2 fL (ref 80.0–100.0)
Monocytes Absolute: 0.6 K/uL (ref 0.1–1.0)
Monocytes Relative: 10 %
Neutro Abs: 2.9 K/uL (ref 1.7–7.7)
Neutrophils Relative %: 54 %
Platelets: 76 K/uL — ABNORMAL LOW (ref 150–400)
RBC: 3.76 MIL/uL — ABNORMAL LOW (ref 4.22–5.81)
RDW: 13.4 % (ref 11.5–15.5)
Smear Review: NORMAL
WBC: 5.5 K/uL (ref 4.0–10.5)
nRBC: 0 % (ref 0.0–0.2)

## 2024-07-01 LAB — ETHANOL: Alcohol, Ethyl (B): 189 mg/dL — ABNORMAL HIGH (ref <15)

## 2024-07-01 NOTE — ED Triage Notes (Addendum)
 Pt to ED via EMS, pt reports he has had a headache and left sided face arm and leg numbness for 1 month. Pt reports drinking a quart of liquor today. Pt had stroke aprox 1 month ago.

## 2024-07-02 ENCOUNTER — Emergency Department
Admission: EM | Admit: 2024-07-02 | Discharge: 2024-07-02 | Disposition: A | Discharge status: Home / Self Care | Payer: MEDICAID | Attending: Emergency Medicine | Admitting: Emergency Medicine

## 2024-07-02 DIAGNOSIS — F10920 Alcohol use, unspecified with intoxication, uncomplicated: Secondary | ICD-10-CM

## 2024-07-02 DIAGNOSIS — R109 Unspecified abdominal pain: Secondary | ICD-10-CM

## 2024-07-02 NOTE — ED Provider Notes (Signed)
 [facility] Provider Note    Event Date/Time   First MD Initiated Contact with Patient 07/02/24 [phone]     (approximate)   History   Abdominal pain  HPI  Jose [name] is a 63 y.o. male  who presented to the emergency department today because of concern for abdominal pain. He says that it has been present for a long time because his liver has cirrhosis. Normally he says they give morphine  for the pain. He denies any change in the pain. Denies any fevers or chills recently. Has secondary complaint of continued left sided numbness that has been present since he had a stroke a little over a month.    Physical Exam   Triage Vital Signs: ED Triage Vitals [07/01/24 2154]  Encounter Vitals Group     BP (!) 135/106     Girls Systolic BP Percentile      Girls Diastolic BP Percentile      Boys Systolic BP Percentile      Boys Diastolic BP Percentile      [name] Rate 75     Resp 17     Temp 97.9 F (36.6 C)     Temp src      SpO2 99 %     Weight 135 lb (61.2 kg)     Height 5' 8 (1.727 m)     Head Circumference      Peak Flow      Pain Score 10     Pain Loc      Pain Education      Exclude from Growth Chart     Most recent vital signs: Vitals:   07/01/24 2154  BP: (!) 135/106  [name]: 75  Resp: 17  Temp: 97.9 F (36.6 C)  SpO2: 99%   General: Awake, alert, oriented. CV:  Good peripheral perfusion. Regular rate and rhythm. Resp:  Normal effort. Lungs clear. Abd:  No distention. Non tender.   ED Results / Procedures / Treatments   Labs (all labs ordered are listed, but only abnormal results are displayed) Labs Reviewed  CBC WITH DIFFERENTIAL/PLATELET - Abnormal; Notable for the following components:      Result Value   RBC 3.76 (*)    Hemoglobin 12.6 (*)    HCT 37.3 (*)    Platelets 76 (*)    All other components within normal limits  BASIC METABOLIC PANEL WITH GFR - Abnormal; Notable for the following components:   Glucose, Bld  110 (*)    All other components within normal limits  URINALYSIS, ROUTINE W REFLEX MICROSCOPIC - Abnormal; Notable for the following components:   Color, Urine STRAW (*)    APPearance CLEAR (*)    Specific Gravity, Urine 1.002 (*)    All other components within normal limits  ETHANOL - Abnormal; Notable for the following components:   Alcohol, Ethyl (B) 189 (*)    All other components within normal limits     EKG  None   RADIOLOGY I independently interpreted and visualized the CT head. My interpretation: No [facility] Radiology interpretation:  IMPRESSION:  1. No acute intracranial abnormality. No CT correlate for the  punctate acute infarct on prior MRI.  2. Chronic left frontal encephalomalacia. Atrophy and chronic small  vessel ischemia.     PROCEDURES:  Critical Care performed: No   MEDICATIONS ORDERED IN ED: Medications - No data to display   IMPRESSION / MDM / ASSESSMENT AND PLAN / ED COURSE  I  reviewed the triage vital signs and the nursing notes.                              Differential diagnosis includes, but is not limited to, chronic pain, gastritis, pancreatitis, hepatitis  Patient's presentation is most consistent with acute presentation with potential threat to life or bodily function.  Patient presented to the emergency department today because of concern for chronic abdominal pain. No tenderness on exam. Did offer tylenol  or ibuprofen  which patient declined. Will give patient PCP referral.      FINAL CLINICAL IMPRESSION(S) / ED DIAGNOSES   Final diagnoses:  Abdominal pain, unspecified abdominal location  Alcoholic intoxication without complication      Note:  This document was prepared using Dragon voice recognition software and may include unintentional dictation errors.    Floy Roberts, MD 07/02/24 [phone]

## 2024-07-07 ENCOUNTER — Telehealth: Payer: Self-pay | Admitting: *Deleted

## 2024-07-07 DIAGNOSIS — I10 Essential (primary) hypertension: Secondary | ICD-10-CM

## 2024-07-07 NOTE — Progress Notes (Signed)
 Complex Care Management Note Care Guide Note  07/07/2024 Name: Jose [name] MRN: [id number] DOB: [birth year]   Complex Care Management Outreach Attempts: An unsuccessful telephone outreach was attempted today to offer the patient information about available complex care management services.  Follow Up Plan:  Additional outreach attempts will be made to offer the patient complex care management information and services.   Brother has no contact with patient.    Encounter Outcome:  No Answer  Harlene Satterfield  [facility]  [facility] Guide  Direct Dial: [phone]  Fax [phone]

## 2024-07-10 NOTE — Progress Notes (Signed)
 Complex Care Management Note Care Guide Note  07/10/2024 Name: Jose [name] MRN: [id number] DOB: June 13, 1961   Complex Care Management Outreach Attempts: A second unsuccessful outreach was attempted today to offer the patient with information about available complex care management services.  Follow Up Plan:  No further outreach attempts will be made at this time. We have been unable to contact the patient to offer or enroll patient in complex care management services.  Encounter Outcome:  No Answer No contact for patient. Close outreach per direction of leadership  Harlene Satterfield  [facility]  [facility], [facility] Guide  Direct Dial: [phone]  Fax [phone]

## 2024-07-15 NOTE — Congregational Nurse Program (Signed)
  Dept: [phone]   Congregational Nurse Program Note  Date of Encounter: 07/15/2024 Client to [facility] with request for foot care. Nails trimmed, calluses gently filed. Feet moisturized. MARLA Marina BSN, RN  Past Medical History: Past Medical History:  Diagnosis Date   Cocaine use    EtOH dependence (HCC)    Hep C w/o coma, chronic (HCC)    Hypertension    Liver cirrhosis, alcoholic (HCC)    Polysubstance abuse (HCC)    Pulmonary embolism [facility])     Encounter Details:  Community Questionnaire - 07/15/24 1232       Questionnaire   Ask client: Do you give verbal consent for me to treat you today? Yes    Student Assistance N/A    Location Patient Served  [facility]    Encounter Setting CN site    Population Status Unhoused    Insurance Medicaid   [place] Access   Insurance/Financial Assistance Referral N/A    Medication N/A    Medical Provider No   client was seen in July 2023, has not rescheduled follow up that was due in October   Screening Referrals Made N/A    Medical Referrals Made N/A    Medical Appointment Completed N/A    CNP Interventions Advocate/Support;Educate    Screenings CN Performed N/A    ED Visit Averted N/A    Life-Saving Intervention Made N/A

## 2024-08-01 ENCOUNTER — Emergency Department
Admission: EM | Admit: 2024-08-01 | Discharge: 2024-08-01 | Disposition: A | Discharge status: Home / Self Care | Payer: MEDICAID | Attending: Emergency Medicine | Admitting: Emergency Medicine

## 2024-08-01 ENCOUNTER — Other Ambulatory Visit: Payer: Self-pay

## 2024-08-01 DIAGNOSIS — Z5900 Homelessness unspecified: Secondary | ICD-10-CM | POA: Diagnosis not present

## 2024-08-01 DIAGNOSIS — M545 Low back pain, unspecified: Secondary | ICD-10-CM | POA: Insufficient documentation

## 2024-08-01 DIAGNOSIS — F1010 Alcohol abuse, uncomplicated: Secondary | ICD-10-CM | POA: Diagnosis not present

## 2024-08-01 DIAGNOSIS — M549 Dorsalgia, unspecified: Secondary | ICD-10-CM | POA: Diagnosis present

## 2024-08-01 DIAGNOSIS — F1090 Alcohol use, unspecified, uncomplicated: Secondary | ICD-10-CM

## 2024-08-01 MED ORDER — ACETAMINOPHEN 500 MG PO TABS
1000.0000 mg | ORAL_TABLET | Freq: Once | ORAL | Status: AC
  Administered 2024-08-01: 1000 mg via ORAL
  Filled 2024-08-01: qty 2

## 2024-08-01 MED ORDER — LIDOCAINE 5 % EX PTCH
1.0000 | MEDICATED_PATCH | CUTANEOUS | Status: DC
  Administered 2024-08-01: 1 via TRANSDERMAL
  Filled 2024-08-01: qty 1

## 2024-08-01 MED ORDER — LIDOCAINE 5 % EX PTCH: 1.0000 | MEDICATED_PATCH | CUTANEOUS | 0 refills | Status: AC

## 2024-08-01 NOTE — ED Provider Notes (Addendum)
 [facility] Provider Note    Event Date/Time   First MD Initiated Contact with Patient 08/01/24 [phone]     (approximate)   History   Chest Pain   HPI  Jose [name] is a 63 y.o. male with history of alcohol abuse, cocaine use, liver cirrhosis, homelessness, history of malingering, well-known to the department, presenting with back pain.  States he did have a beer last night.  Says the back pains been ongoing for a while.  No complaints of weakness or numbness, or incontinence.  He has no other complaints.  Per independent history from EMS, he was satting 100% on room air, blood pressure was 150/89, heart rate was 94.  They reported that he was coming for chest pain and he reported vomiting blood but he did not endorse that for me today.     Physical Exam   Triage Vital Signs: ED Triage Vitals  Encounter Vitals Group     BP 08/01/24 0153 (!) 144/94     Girls Systolic BP Percentile --      Girls Diastolic BP Percentile --      Boys Systolic BP Percentile --      Boys Diastolic BP Percentile --      Pulse Rate 08/01/24 0153 80     Resp 08/01/24 0153 16     Temp 08/01/24 0153 97.8 F (36.6 C)     Temp Source 08/01/24 0153 Oral     SpO2 08/01/24 0153 98 %     Weight --      Height --      Head Circumference --      Peak Flow --      Pain Score 08/01/24 0150 5     Pain Loc --      Pain Education --      Exclude from Growth Chart --     Most recent vital signs: Vitals:   08/01/24 0153  BP: (!) 144/94  Pulse: 80  Resp: 16  Temp: 97.8 F (36.6 C)  SpO2: 98%     General: Awake, no distress.  CV:  Good peripheral perfusion.  Resp:  Normal effort.  Abd:  No distention.  Other:  No midline spinal tenderness, no saddle anesthesia, no focal weakness or numbness, ambulatory.   ED Results / Procedures / Treatments   Labs (all labs ordered are listed, but only abnormal results are displayed) Labs Reviewed - No data to  display   EKG  EKG shows, sinus rhythm, rate 98, right bundle branch block, no obvious ischemic ST elevation, T wave flattening to aVL, T wave inversion to V2, V3, not significantly changed compared to prior     PROCEDURES:  Critical Care performed: No  Procedures   MEDICATIONS ORDERED IN ED: Medications  acetaminophen  (TYLENOL ) tablet 1,000 mg (has no administration in time range)  lidocaine  (LIDODERM ) 5 % 1 patch (has no administration in time range)     IMPRESSION / MDM / ASSESSMENT AND PLAN / ED COURSE  I reviewed the triage vital signs and the nursing notes.                              Differential diagnosis includes, but is not limited to, musculoskeletal pain, strain, no features of cauda equina at this time, back pain appears to be acute on chronic.  First complaints with EMS about the chest pain and hematemesis, he did  not endorse that for me today when I evaluated him.  Has no other complaints.  Not acutely intoxicated at this time.  He had an EKG done at triage.  Will give him some Tylenol  and Lidoderm  patch and discharged with instructions to follow-up with his primary care doctor for reassessment next week.  Will also send a prescription for Lidoderm  patches.  Social determinants of health, homelessness, substance use.  Patient's presentation is most consistent with acute presentation with potential threat to life or bodily function.         FINAL CLINICAL IMPRESSION(S) / ED DIAGNOSES   Final diagnoses:  Acute left-sided low back pain, unspecified whether sciatica present  Alcohol use  Homelessness     Rx / DC Orders   ED Discharge Orders          Ordered    lidocaine  (LIDODERM ) 5 %  Every 24 hours        08/01/24 0452             Note:  This document was prepared using Dragon voice recognition software and may include unintentional dictation errors.    Waymond Lorelle Cummins, MD 08/01/24 9547    Waymond Lorelle Cummins, MD 08/01/24 [phone]

## 2024-08-01 NOTE — ED Triage Notes (Signed)
 Pt to ed from [facility] via [facility] for the CP x all day. Pt is caox4, in no acute distress.  158/89 94 HR  100% RA   No ASA admin due to pt states he has been vomiting blood all day.

## 2024-08-08 ENCOUNTER — Emergency Department
Admission: EM | Admit: 2024-08-08 | Discharge: 2024-08-08 | Disposition: A | Discharge status: Home / Self Care | Payer: MEDICAID | Attending: Emergency Medicine | Admitting: Emergency Medicine

## 2024-08-08 ENCOUNTER — Emergency Department: Payer: MEDICAID

## 2024-08-08 ENCOUNTER — Other Ambulatory Visit: Payer: Self-pay

## 2024-08-08 DIAGNOSIS — R051 Acute cough: Secondary | ICD-10-CM | POA: Insufficient documentation

## 2024-08-08 DIAGNOSIS — M791 Myalgia, unspecified site: Secondary | ICD-10-CM | POA: Insufficient documentation

## 2024-08-08 DIAGNOSIS — Z5900 Homelessness unspecified: Secondary | ICD-10-CM | POA: Diagnosis not present

## 2024-08-08 LAB — RESP PANEL BY RT-PCR (RSV, FLU A&B, COVID)  RVPGX2
Influenza A by PCR: NEGATIVE
Influenza B by PCR: NEGATIVE
Resp Syncytial Virus by PCR: NEGATIVE
SARS Coronavirus 2 by RT PCR: NEGATIVE

## 2024-08-08 MED ORDER — ACETAMINOPHEN 500 MG PO TABS
1000.0000 mg | ORAL_TABLET | Freq: Once | ORAL | Status: AC
  Administered 2024-08-08: 1000 mg via ORAL
  Filled 2024-08-08: qty 2

## 2024-08-08 NOTE — ED Triage Notes (Signed)
 Patient brought in via [facility]. States he is hurting all over.

## 2024-08-08 NOTE — ED Provider Notes (Signed)
 [facility] Provider Note    Event Date/Time   First MD Initiated Contact with Patient 08/08/24 0155     (approximate)   History   Generalized Body Aches   HPI  Jose [name] is a 63 y.o. male   Past medical history of alcohol use and prior stroke here for body aches and pains and a mild cough starting couple days ago.  He is homeless.  He is asking if he is able to stay because it is cold outside.  He offers no other acute medical complaints.  External Medical Documents Reviewed: Prior hospital notes      Physical Exam   Triage Vital Signs: ED Triage Vitals [08/08/24 0028]  Encounter Vitals Group     BP 134/84     Girls Systolic BP Percentile      Girls Diastolic BP Percentile      Boys Systolic BP Percentile      Boys Diastolic BP Percentile      Pulse Rate 94     Resp 18     Temp 98.6 F (37 C)     Temp Source Oral     SpO2 99 %     Weight 140 lb (63.5 kg)     Height 5' 8 (1.727 m)     Head Circumference      Peak Flow      Pain Score      Pain Loc      Pain Education      Exclude from Growth Chart     Most recent vital signs: Vitals:   08/08/24 0028  BP: 134/84  Pulse: 94  Resp: 18  Temp: 98.6 F (37 C)  SpO2: 99%    General: Awake, no distress.  CV:  Good peripheral perfusion.  Resp:  Normal effort.  Abd:  No distention.  Other:  Awake alert comfortable appearing with normal vital signs afebrile breathing comfortably on room air.  Clear lungs to auscultation without focality or wheezing, neck supple full range of motion, no signs of obvious trauma.   ED Results / Procedures / Treatments   Labs (all labs ordered are listed, but only abnormal results are displayed) Labs Reviewed  RESP PANEL BY RT-PCR (RSV, FLU A&B, COVID)  [id number]     I ordered and reviewed the above labs they are notable for negative viral testing.   RADIOLOGY I independently reviewed and interpreted chest x-ray and I see no obvious  focality or pneumothorax I also reviewed radiologist's formal read.   PROCEDURES:  Critical Care performed: No  Procedures   MEDICATIONS ORDERED IN ED: Medications  acetaminophen  (TYLENOL ) tablet 1,000 mg (has no administration in time range)     IMPRESSION / MDM / ASSESSMENT AND PLAN / ED COURSE  I reviewed the triage vital signs and the nursing notes.                                Patient's presentation is most consistent with acute complicated illness / injury requiring diagnostic workup.  Differential diagnosis includes, but is not limited to, viral illness, pneumonia, malingering   MDM:    Patient with chief complaint of being cold outside wanting a place to stay where it is warm.  Also offers myalgias and cough, and given his comorbidities and alcohol use will check for pneumonia with viral illness but fortunately viral swabs, chest x-ray, are negative.  Exam very benign overall doubt life-threatening medical condition at this time.  Discharge.       FINAL CLINICAL IMPRESSION(S) / ED DIAGNOSES   Final diagnoses:  Myalgia  Acute cough  Homeless     Rx / DC Orders   ED Discharge Orders     None        Note:  This document was prepared using Dragon voice recognition software and may include unintentional dictation errors.    Cyrena Mylar, MD 08/08/24 [phone]

## 2024-08-08 NOTE — Discharge Instructions (Signed)
 Take Tylenol  650 mg every 6 hours as needed for aches and pains.  Thank you for choosing us  for your health care today!  Please see your primary doctor this week for a follow up appointment.   If you have any new, worsening, or unexpected symptoms call your doctor right away or come back to the emergency department for reevaluation.  It was my pleasure to care for you today.   Ginnie EDISON Cyrena, MD

## 2024-08-14 ENCOUNTER — Telehealth: Payer: Self-pay | Admitting: *Deleted

## 2024-08-14 NOTE — Progress Notes (Unsigned)
 Complex Care Management Note Care Guide Note  08/14/2024 Name: Jose [name] MRN: [id number] DOB: March 25, 1961   Complex Care Management Outreach Attempts: An unsuccessful telephone outreach was attempted today to offer the patient information about available complex care management services.  Follow Up Plan:  Additional outreach attempts will be made to offer the patient complex care management information and services.   Encounter Outcome:  No Answer  Thedford Franks, CMA [place]  [facility], [facility] Guide Direct Dial: [phone]  Fax: [phone] Website: [email or web address]

## 2024-08-17 NOTE — Progress Notes (Unsigned)
 Complex Care Management Note Care Guide Note  08/17/2024 Name: Jose [name] MRN: [id number] DOB: 11/10/1960   Complex Care Management Outreach Attempts: A second unsuccessful outreach was attempted today to offer the patient with information about available complex care management services.  Follow Up Plan:  Additional outreach attempts will be made to offer the patient complex care management information and services.   Encounter Outcome:  No Answer  Thedford Franks, CMA [place]  [facility], [facility] Guide Direct Dial: [phone]  Fax: [phone] Website: [email or web address]

## 2024-08-18 NOTE — Progress Notes (Signed)
 Complex Care Management Note Care Guide Note  08/18/2024 Name: Jose [name] MRN: [id number] DOB: 09-Feb-1961   Complex Care Management Outreach Attempts: A third unsuccessful outreach was attempted today to offer the patient with information about available complex care management services.  Follow Up Plan:  No further outreach attempts will be made at this time. We have been unable to contact the patient to offer or enroll patient in complex care management services.  Encounter Outcome:  No Answer  Thedford Franks, CMA [place]  [facility], [facility] Guide Direct Dial: [phone]  Fax: [phone] Website: [email or web address]

## 2024-10-02 ENCOUNTER — Other Ambulatory Visit: Payer: Self-pay

## 2024-10-02 ENCOUNTER — Emergency Department
Admission: EM | Admit: 2024-10-02 | Discharge: 2024-10-03 | Disposition: A | Payer: MEDICAID | Attending: Emergency Medicine | Admitting: Emergency Medicine

## 2024-10-02 ENCOUNTER — Encounter: Payer: Self-pay | Admitting: Emergency Medicine

## 2024-10-02 DIAGNOSIS — W01198A Fall on same level from slipping, tripping and stumbling with subsequent striking against other object, initial encounter: Secondary | ICD-10-CM | POA: Diagnosis not present

## 2024-10-02 DIAGNOSIS — F10120 Alcohol abuse with intoxication, uncomplicated: Secondary | ICD-10-CM | POA: Diagnosis not present

## 2024-10-02 DIAGNOSIS — S0990XA Unspecified injury of head, initial encounter: Secondary | ICD-10-CM | POA: Diagnosis present

## 2024-10-02 DIAGNOSIS — F10920 Alcohol use, unspecified with intoxication, uncomplicated: Secondary | ICD-10-CM

## 2024-10-02 DIAGNOSIS — W19XXXA Unspecified fall, initial encounter: Secondary | ICD-10-CM

## 2024-10-02 DIAGNOSIS — Z8673 Personal history of transient ischemic attack (TIA), and cerebral infarction without residual deficits: Secondary | ICD-10-CM | POA: Insufficient documentation

## 2024-10-02 LAB — CBG MONITORING, ED: Glucose-Capillary: 109 mg/dL — ABNORMAL HIGH (ref 70–99)

## 2024-10-02 MED ORDER — ACETAMINOPHEN 500 MG PO TABS
1000.0000 mg | ORAL_TABLET | Freq: Once | ORAL | Status: AC
  Administered 2024-10-02: 1000 mg via ORAL
  Filled 2024-10-02: qty 2

## 2024-10-02 NOTE — ED Provider Notes (Incomplete)
 "  [facility] Provider Note    Event Date/Time   First MD Initiated Contact with Patient 10/02/24 2302     (approximate)   History   Alcohol Intoxication   HPI  Jose [name] is a 64 y.o. male   Past medical history of alcohol and polysubstance use, homelessness, history of SDH, PE, stroke/TIA, residual left-sided weakness, here with alcohol intoxication and a reported fall.  Apparently he called out to a bystander to call EMS [facility] the local [facility]wash.  He states that he could not remember why he for EMS report.  When I speak to him he admits to drinking beer today.  He has slurred speech.  He states that he fell and hit the back of his head.  He reports no other pain elsewhere no other acute medical complaints.  He is asking for morphine .   External Medical Documents Reviewed: Previous hospital notes      Physical Exam   Triage Vital Signs: ED Triage Vitals  Encounter Vitals Group     BP 10/02/24 2248 (!) 130/93     Girls Systolic BP Percentile --      Girls Diastolic BP Percentile --      Boys Systolic BP Percentile --      Boys Diastolic BP Percentile --      Pulse Rate 10/02/24 2248 85     Resp 10/02/24 2248 16     Temp 10/02/24 2248 98 F (36.7 C)     Temp Source 10/02/24 2248 Oral     SpO2 10/02/24 2248 96 %     Weight 10/02/24 2249 140 lb (63.5 kg)     Height 10/02/24 2249 5' 8 (1.727 m)     Head Circumference --      Peak Flow --      Pain Score --      Pain Loc --      Pain Education --      Exclude from Growth Chart --     Most recent vital signs: Vitals:   10/02/24 2248  BP: (!) 130/93  Pulse: 85  Resp: 16  Temp: 98 F (36.7 C)  SpO2: 96%    General: Awake, no distress.  CV:  Good peripheral perfusion.  Resp:  Normal effort.  Abd:  No distention.  Other:  Altered mental status.  Slurred speech.  Following commands and moving all extremities.  No obvious external signs of head injury.  Palpation of the chest  wall, T and L-spine, abdomen atraumatic.  Able to move all extremities at all joints.   ED Results / Procedures / Treatments   Labs (all labs ordered are listed, but only abnormal results are displayed) Labs Reviewed  CBG MONITORING, ED - Abnormal; Notable for the following components:      Result Value   Glucose-Capillary 109 (*)    All other components within normal limits  I personally reviewed the results of his lab testing and see a normal CBG.   RADIOLOGY I independently reviewed and interpreted CT head see no obvious bleeding or midline shift I also reviewed radiologist's formal read.   PROCEDURES:  Critical Care performed: No  Procedures   MEDICATIONS ORDERED IN ED: Medications  acetaminophen  (TYLENOL ) tablet 1,000 mg (1,000 mg Oral Given 10/02/24 2320)    IMPRESSION / MDM / ASSESSMENT AND PLAN / ED COURSE  I reviewed the triage vital signs and the nursing notes.  Patient's presentation is most consistent with acute presentation with potential threat to life or bodily function.  Differential diagnosis includes, but is not limited to, alcohol intoxication, head injury, neck injury  MDM: Patient admits to drinking beer tonight and appears intoxicated with slurred speech, which I think is more related to his alcohol intoxication rather than neurologic emergency like a stroke.  In any case, there were no other focal neurologic deficits appreciated on my exam and certainly not a stroke code given the unknown timing of last known normal.  Will check CBG.  Continue to monitor for improvement.  In terms of his reported fall and head trauma, will get CT head and neck.  Remainder of his head to toe examination shows no significant signs of trauma defer further imaging or lab testing at this time.   Trauma imaging negative.  Patient stable.  Will hold for sobriety/TOC consultation in the morning for high utilizer of care plan.         FINAL CLINICAL IMPRESSION(S) / ED DIAGNOSES   Final diagnoses:  Alcoholic intoxication without complication  Fall, initial encounter  Traumatic injury of head, initial encounter     Rx / DC Orders   ED Discharge Orders     None        Note:  This document was prepared using Dragon voice recognition software and may include unintentional dictation errors.    Cyrena Mylar, MD 10/03/24 9948    Cyrena Mylar, MD 10/03/24 [phone]  "

## 2024-10-02 NOTE — ED Triage Notes (Signed)
 Patient to 19h via EMS from local [facility].  Per EMS patient had flagged down someone and asked them to call 911 for him.  When EMS arrived person who called was gone and patient couldn't remember why he had them called.  Patient appears intoxicated and has history of alcohol intoxication.  When asked if anything hurts patient reports his liver has hurt for 3 years.

## 2024-10-03 ENCOUNTER — Emergency Department: Payer: MEDICAID

## 2024-10-03 NOTE — ED Provider Notes (Signed)
 64 year old male who was brought into our emergency department due to concern of intoxication, at the time of signout he is awaiting consultation with social work to help discuss possible resources as he is a frequent ER utilizer.  Unfortunately patient after being here for 10 hours, he no longer wants to be here.  He verbalizes understanding of situation, he is clinically sober, ambulating appropriately, has insight into situation.  Will have him discharged home at this time.  I discussed return precautions and offered further assistance should he change his mind or need further resources.   Fernand Rossie HERO, MD 10/03/24 [phone]

## 2024-10-03 NOTE — ED Notes (Signed)
 Pt up to use restroom, steady gait, no assistance required.

## 2024-10-03 NOTE — ED Notes (Signed)
 Pt currently sleeping, seen pulling sheet over face to block lights. Pt diet order to be placed while waiting for SW

## 2024-10-03 NOTE — ED Notes (Addendum)
Dr. Khan at bedside speaking with pt.

## 2024-10-03 NOTE — ED Notes (Signed)
 Pt to CT via wheelchair

## 2024-10-03 NOTE — Discharge Instructions (Signed)
 You were seen today due to concern of alcohol intoxication.  At this time fortunately you have sobered.  I have provided you with resources for follow-up in the event that you are interested in rehab.  If you start developing symptoms of withdrawal such as shakes, agitation, severe nausea, vomiting, or any other symptoms you find concerning please return to the emergency department immediately for further medical management.

## 2024-10-03 NOTE — ED Notes (Signed)
 Pt telling this RN he is ready to go. Pt states he cannot stay and listen to that guy anymore, it was all night. Pt referring to another pt in the hallway. Pt informed are waiting for SW to see pt. Pt stating he does not need to see SW at this time, would just like to leave. Dr Fernand made aware.

## 2024-10-14 NOTE — Congregational Nurse Program (Signed)
" °  Dept: [phone]   Congregational Nurse Program Note  Date of Encounter: 10/14/2024 Client to [facility], nurse led clinic, with request for foot care. Foot care provided. MARLA Marina BSN, RN Past Medical History: Past Medical History:  Diagnosis Date   Cocaine use    EtOH dependence (HCC)    Hep C w/o coma, chronic (HCC)    Hypertension    Liver cirrhosis, alcoholic (HCC)    Polysubstance abuse (HCC)    Pulmonary embolism [facility])     Encounter Details:  Community Questionnaire - 10/14/24 1325       Questionnaire   Ask client: Do you give verbal consent for me to treat you today? Yes    Student Assistance N/A    Location Patient Served  [facility]    Encounter Setting CN site    Population Status Unhoused    Insurance Medicaid   [place] Access   Insurance/Financial Assistance Referral N/A    Medication N/A    Medical Provider No   client was seen in July 2023, has not rescheduled follow up that was due in October   Medical Referrals Made NA    Medical Appointment Completed N/A    Screenings CN Performed (remember to also record results) NA    CNP Interventions Advocate/Support    ED Visit Averted N/A            "
